# Patient Record
Sex: Female | Born: 1935 | Race: White | Hispanic: No | Smoking: Never smoker
Health system: Southern US, Community
[De-identification: ages and names within clinical notes are randomized; demographics above are authoritative.]

## PROBLEM LIST (undated history)

## (undated) DIAGNOSIS — M1990 Unspecified osteoarthritis, unspecified site: Secondary | ICD-10-CM

## (undated) DIAGNOSIS — D509 Iron deficiency anemia, unspecified: Secondary | ICD-10-CM

## (undated) DIAGNOSIS — I10 Essential (primary) hypertension: Secondary | ICD-10-CM

## (undated) DIAGNOSIS — I2510 Atherosclerotic heart disease of native coronary artery without angina pectoris: Secondary | ICD-10-CM

## (undated) DIAGNOSIS — G8929 Other chronic pain: Secondary | ICD-10-CM

## (undated) DIAGNOSIS — R011 Cardiac murmur, unspecified: Secondary | ICD-10-CM

## (undated) DIAGNOSIS — M549 Dorsalgia, unspecified: Secondary | ICD-10-CM

## (undated) DIAGNOSIS — E119 Type 2 diabetes mellitus without complications: Secondary | ICD-10-CM

## (undated) DIAGNOSIS — H269 Unspecified cataract: Secondary | ICD-10-CM

## (undated) DIAGNOSIS — K922 Gastrointestinal hemorrhage, unspecified: Secondary | ICD-10-CM

## (undated) DIAGNOSIS — I350 Nonrheumatic aortic (valve) stenosis: Secondary | ICD-10-CM

## (undated) DIAGNOSIS — E785 Hyperlipidemia, unspecified: Secondary | ICD-10-CM

## (undated) HISTORY — PX: CARDIAC CATHETERIZATION: SHX172

## (undated) HISTORY — DX: Atherosclerotic heart disease of native coronary artery without angina pectoris: I25.10

## (undated) HISTORY — PX: ABDOMINAL HYSTERECTOMY: SHX81

## (undated) HISTORY — DX: Nonrheumatic aortic (valve) stenosis: I35.0

## (undated) HISTORY — DX: Unspecified cataract: H26.9

## (undated) HISTORY — DX: Gastrointestinal hemorrhage, unspecified: K92.2

## (undated) HISTORY — DX: Type 2 diabetes mellitus without complications: E11.9

## (undated) HISTORY — PX: BACK SURGERY: SHX140

## (undated) HISTORY — DX: Hyperlipidemia, unspecified: E78.5

## (undated) HISTORY — DX: Iron deficiency anemia, unspecified: D50.9

## (undated) HISTORY — PX: CATARACT EXTRACTION: SUR2

## (undated) HISTORY — DX: Unspecified osteoarthritis, unspecified site: M19.90

## (undated) HISTORY — PX: TONSILLECTOMY AND ADENOIDECTOMY: SUR1326

## (undated) HISTORY — PX: APPENDECTOMY: SHX54

## (undated) HISTORY — DX: Essential (primary) hypertension: I10

## (undated) HISTORY — DX: Cardiac murmur, unspecified: R01.1

## (undated) HISTORY — PX: THORACOLUMBAR SYRINGO SHUNT: SHX2505

---

## 2000-05-14 ENCOUNTER — Encounter: Payer: Self-pay | Admitting: Cardiology

## 2000-05-14 ENCOUNTER — Encounter: Admission: RE | Admit: 2000-05-14 | Discharge: 2000-05-14 | Payer: Self-pay | Admitting: Cardiology

## 2000-05-22 ENCOUNTER — Ambulatory Visit (HOSPITAL_COMMUNITY): Admission: RE | Admit: 2000-05-22 | Discharge: 2000-05-22 | Payer: Self-pay | Admitting: Cardiology

## 2004-06-11 ENCOUNTER — Ambulatory Visit (HOSPITAL_COMMUNITY): Admission: RE | Admit: 2004-06-11 | Discharge: 2004-06-11 | Payer: Self-pay | Admitting: Neurosurgery

## 2004-07-19 ENCOUNTER — Inpatient Hospital Stay (HOSPITAL_COMMUNITY): Admission: RE | Admit: 2004-07-19 | Discharge: 2004-07-24 | Payer: Self-pay | Admitting: Neurosurgery

## 2005-02-08 ENCOUNTER — Ambulatory Visit: Payer: Self-pay | Admitting: Unknown Physician Specialty

## 2005-04-25 ENCOUNTER — Ambulatory Visit (HOSPITAL_COMMUNITY): Admission: RE | Admit: 2005-04-25 | Discharge: 2005-04-25 | Payer: Self-pay | Admitting: Orthopedic Surgery

## 2005-05-22 ENCOUNTER — Encounter: Admission: RE | Admit: 2005-05-22 | Discharge: 2005-05-22 | Payer: Self-pay | Admitting: Cardiology

## 2005-05-28 ENCOUNTER — Ambulatory Visit: Payer: Self-pay | Admitting: Family Medicine

## 2005-05-29 ENCOUNTER — Ambulatory Visit (HOSPITAL_COMMUNITY): Admission: RE | Admit: 2005-05-29 | Discharge: 2005-05-29 | Payer: Self-pay | Admitting: Cardiology

## 2005-06-06 ENCOUNTER — Ambulatory Visit (HOSPITAL_COMMUNITY): Admission: RE | Admit: 2005-06-06 | Discharge: 2005-06-06 | Payer: Self-pay | Admitting: Cardiology

## 2006-02-14 ENCOUNTER — Ambulatory Visit (HOSPITAL_COMMUNITY): Admission: RE | Admit: 2006-02-14 | Discharge: 2006-02-14 | Payer: Self-pay | Admitting: Neurosurgery

## 2006-03-13 ENCOUNTER — Inpatient Hospital Stay (HOSPITAL_COMMUNITY): Admission: RE | Admit: 2006-03-13 | Discharge: 2006-03-17 | Payer: Self-pay | Admitting: Neurosurgery

## 2006-12-02 ENCOUNTER — Ambulatory Visit: Payer: Self-pay | Admitting: Gastroenterology

## 2006-12-02 LAB — HM COLONOSCOPY: HM Colonoscopy: NORMAL

## 2007-02-03 ENCOUNTER — Ambulatory Visit: Payer: Self-pay | Admitting: Gastroenterology

## 2007-04-11 ENCOUNTER — Other Ambulatory Visit: Payer: Self-pay

## 2007-04-11 ENCOUNTER — Emergency Department: Payer: Self-pay | Admitting: Unknown Physician Specialty

## 2007-10-04 ENCOUNTER — Encounter: Admission: RE | Admit: 2007-10-04 | Discharge: 2007-10-04 | Payer: Self-pay | Admitting: Neurosurgery

## 2009-03-07 ENCOUNTER — Ambulatory Visit: Payer: Self-pay | Admitting: Family Medicine

## 2009-03-08 ENCOUNTER — Encounter: Admission: RE | Admit: 2009-03-08 | Discharge: 2009-03-08 | Payer: Self-pay | Admitting: Neurosurgery

## 2009-05-18 ENCOUNTER — Inpatient Hospital Stay (HOSPITAL_COMMUNITY): Admission: RE | Admit: 2009-05-18 | Discharge: 2009-05-26 | Payer: Self-pay | Admitting: Neurosurgery

## 2009-05-24 ENCOUNTER — Ambulatory Visit: Payer: Self-pay | Admitting: Physical Medicine & Rehabilitation

## 2010-03-28 ENCOUNTER — Emergency Department: Payer: Self-pay | Admitting: Emergency Medicine

## 2010-12-06 ENCOUNTER — Ambulatory Visit: Payer: Self-pay | Admitting: Family Medicine

## 2010-12-06 LAB — HM MAMMOGRAPHY

## 2011-02-24 LAB — CBC
HCT: 22.5 % — ABNORMAL LOW (ref 36.0–46.0)
HCT: 28.7 % — ABNORMAL LOW (ref 36.0–46.0)
Hemoglobin: 7.6 g/dL — CL (ref 12.0–15.0)
Hemoglobin: 9.6 g/dL — ABNORMAL LOW (ref 12.0–15.0)
MCHC: 33.9 g/dL (ref 30.0–36.0)
MCV: 93.7 fL (ref 78.0–100.0)
Platelets: UNDETERMINED 10*3/uL (ref 150–400)
RBC: 2.4 MIL/uL — ABNORMAL LOW (ref 3.87–5.11)
RBC: 3 MIL/uL — ABNORMAL LOW (ref 3.87–5.11)
RDW: 14 % (ref 11.5–15.5)

## 2011-02-24 LAB — URINE MICROSCOPIC-ADD ON

## 2011-02-24 LAB — GLUCOSE, CAPILLARY
Glucose-Capillary: 103 mg/dL — ABNORMAL HIGH (ref 70–99)
Glucose-Capillary: 109 mg/dL — ABNORMAL HIGH (ref 70–99)
Glucose-Capillary: 118 mg/dL — ABNORMAL HIGH (ref 70–99)
Glucose-Capillary: 132 mg/dL — ABNORMAL HIGH (ref 70–99)
Glucose-Capillary: 144 mg/dL — ABNORMAL HIGH (ref 70–99)
Glucose-Capillary: 150 mg/dL — ABNORMAL HIGH (ref 70–99)
Glucose-Capillary: 154 mg/dL — ABNORMAL HIGH (ref 70–99)
Glucose-Capillary: 160 mg/dL — ABNORMAL HIGH (ref 70–99)
Glucose-Capillary: 163 mg/dL — ABNORMAL HIGH (ref 70–99)
Glucose-Capillary: 182 mg/dL — ABNORMAL HIGH (ref 70–99)
Glucose-Capillary: 47 mg/dL — ABNORMAL LOW (ref 70–99)
Glucose-Capillary: 58 mg/dL — ABNORMAL LOW (ref 70–99)
Glucose-Capillary: 59 mg/dL — ABNORMAL LOW (ref 70–99)
Glucose-Capillary: 61 mg/dL — ABNORMAL LOW (ref 70–99)
Glucose-Capillary: 62 mg/dL — ABNORMAL LOW (ref 70–99)
Glucose-Capillary: 63 mg/dL — ABNORMAL LOW (ref 70–99)
Glucose-Capillary: 69 mg/dL — ABNORMAL LOW (ref 70–99)
Glucose-Capillary: 73 mg/dL (ref 70–99)
Glucose-Capillary: 73 mg/dL (ref 70–99)
Glucose-Capillary: 84 mg/dL (ref 70–99)
Glucose-Capillary: 92 mg/dL (ref 70–99)
Glucose-Capillary: 92 mg/dL (ref 70–99)
Glucose-Capillary: 97 mg/dL (ref 70–99)

## 2011-02-24 LAB — URINALYSIS, ROUTINE W REFLEX MICROSCOPIC
Bilirubin Urine: NEGATIVE
Glucose, UA: NEGATIVE mg/dL
Protein, ur: NEGATIVE mg/dL

## 2011-02-24 LAB — BASIC METABOLIC PANEL
BUN: 22 mg/dL (ref 6–23)
CO2: 23 mEq/L (ref 19–32)
Calcium: 7.8 mg/dL — ABNORMAL LOW (ref 8.4–10.5)
Calcium: 7.8 mg/dL — ABNORMAL LOW (ref 8.4–10.5)
Calcium: 8.1 mg/dL — ABNORMAL LOW (ref 8.4–10.5)
Chloride: 101 mEq/L (ref 96–112)
Chloride: 105 mEq/L (ref 96–112)
Creatinine, Ser: 1.25 mg/dL — ABNORMAL HIGH (ref 0.4–1.2)
Creatinine, Ser: 1.31 mg/dL — ABNORMAL HIGH (ref 0.4–1.2)
Creatinine, Ser: 1.54 mg/dL — ABNORMAL HIGH (ref 0.4–1.2)
GFR calc Af Amer: 47 mL/min — ABNORMAL LOW (ref >60)
GFR calc Af Amer: 50 mL/min — ABNORMAL LOW (ref >60)
GFR calc non Af Amer: 41 mL/min — ABNORMAL LOW (ref >60)
Glucose, Bld: 165 mg/dL — ABNORMAL HIGH (ref 70–99)
Glucose, Bld: 50 mg/dL — ABNORMAL LOW (ref 70–99)
Potassium: 5.1 mEq/L (ref 3.5–5.1)
Sodium: 136 mEq/L (ref 135–145)

## 2011-02-24 LAB — CROSSMATCH: Antibody Screen: NEGATIVE

## 2011-02-24 LAB — URINE CULTURE

## 2011-02-25 LAB — BASIC METABOLIC PANEL
BUN: 21 mg/dL (ref 6–23)
CO2: 23 mEq/L (ref 19–32)
Calcium: 9.2 mg/dL (ref 8.4–10.5)
Creatinine, Ser: 1.42 mg/dL — ABNORMAL HIGH (ref 0.4–1.2)
Glucose, Bld: 133 mg/dL — ABNORMAL HIGH (ref 70–99)

## 2011-02-25 LAB — TYPE AND SCREEN
ABO/RH(D): O POS
Antibody Screen: NEGATIVE

## 2011-02-25 LAB — CBC
MCHC: 33.5 g/dL (ref 30.0–36.0)
RDW: 13.6 % (ref 11.5–15.5)

## 2011-04-02 NOTE — Op Note (Signed)
Jasmine Webster, SANT NO.:  [id number]   MEDICAL RECORD NO.:  [id number]          PATIENT TYPE:  INP   LOCATION:  3112                         FACILITY:  [facility]   PHYSICIAN:  Ophelia Charter, M.D.DATE OF BIRTH:  1926-10-18   DATE OF PROCEDURE:  05/18/2009  DATE OF DISCHARGE:                               OPERATIVE REPORT   BRIEF HISTORY:  The patient is an 77 year old white female who I  previously performed L2-3, L3-4, and L4-5 lumbar fusion on.  The patient  initially did well, but more recently has developed increasing back,  hip, and leg pain consistent with neurogenic claudication.  She was  worked up with a lumbar MRI, which demonstrated the patient had a  spondylolisthesis at L1-2 and L5-S1 with severe stenosis.  I discussed  the various treatment options with the patient including the surgery.  The patient has weighed the risks, benefits, and alternatives of the  surgery and desired to proceed with the exploration and lumbar fusion  with extension, and decompression fusion from T10 through S1.   PREOPERATIVE DIAGNOSES:  L1-2 and L5-S1 spinal stenosis, disk  degeneration, spondylolisthesis, lumbago, lumbar radiculopathy.   POSTOPERATIVE DIAGNOSIS:  L1-2 and L5-S1 spinal stenosis, disk  degeneration, spondylolisthesis, lumbago, lumbar radiculopathy.   PROCEDURE:  Exploration of lumbar fusion; decompressive laminectomy at  L1 as well as L5 to decompress the thecal sac and bilateral L1, L2, L5,  and S1 nerve roots (this work was in addition to the work required due  to posterior lumbar interbody fusion).  Posterior lumbar interbody  fusion L5-S1 with local morselized autograft bone and Actifuse, bone  graft extenders with bone morphogenic protein; insertion of L5-S1  interbody prosthesis (Capstone PEEK interbody prosthesis); posterior  segmental fixation T10 through S1 with Legacy titanium pedicle screws  and rods; posterolateral arthrodesis at T10-11,  T11-12, T12-L1, L1-2, L5-  S1 with bone morphogenic protein, local autograft bone, and  Actifuse/Vitoss bone graft extenders.   SURGEON:  Ophelia Charter, MD   ASSISTANT:  Otilio Connors, MD   ANESTHESIA:  General endotracheal.   ESTIMATED BLOOD LOSS:  350 mL.   SPECIMENS:  None.   DRAINS:  None.   COMPLICATIONS:  None.   DESCRIPTION OF THE PROCEDURE:  The patient was brought to the operating  room by the Anesthesia team.  General endotracheal anesthesia was  induced.  The patient was turned to the prone position on the Wilson  frame.  Her lumbosacral region was then prepared with Betadine scrub and  Betadine solution.  Sterile drapes were applied.  I then injected the  area to be incised with Marcaine with epinephrine solution.  I used a  scalpel to make a linear midline incision from approximately T10 down  the S1.  I used an electrocautery performing bilateral subperiosteal  dissection exposing the spinous process and lamina from approximately T9  down to the upper sacrum.  We then inserted the cerebellar and Versa-  Trac retractor for exposure.  We used electrocautery to expose the old  hardware.  We proceeded to explore down to lumbar fusion by inspecting  the facet and see if there is any loosening of the hardware.  There was  none and it appeared that she had a good fusion from L2 through L5.   We now turned our attention to the decompression at L5-S1 and L1-2.  We  used a high-speed drill to perform bilateral L5 and L1 laminotomies.  We  used a Kerrison punch to remove the ligament flavum at L1-2 and L5-S1  bilaterally.  We exposed the thecal sac.  There was quite a bit stenosis  at both levels.  We performed wide foraminotomies about the bilateral L1-  L2 and L5-S1 nerve roots decompressing the thecal sac and the nerve  roots.  Of note, the work required to do this was in addition to work  for her to do lumbar fusion because of the patient's severe stenosis,   facet arthropathy, foraminal stenosis, etc.   Having completed the decompression, we now turned our attention to the  posterior lumbar interbody fusion.  I incised the L5-S1 intervertebral  disk with a 15-blade scalpel bilaterally and performed a partial  intervertebral diskectomy.  We then prepared the vertebral endplates for  the interbody fusion by using curettes.  We then used trial spacers.  We  determined to use a 10 x 22 mm PEEK interbody prosthesis at L5-S1.  We  prefilled the prosthesis with a combination of local morselized  autograft bone, bone morphogenic protein, and Actifuse bone graft  extenders.  We inserted the prosthesis into the L5-S1 interspace  bilaterally of course after retracting the neural structures out of  harm's way.  We then filled the remainder of disk space at L5-S4 with  Actifuse and local morselized autograft bone.  This completed the  posterior lumbar interbody fusion at L5-S1.  We contemplated doing a  posterior lumbar interbody fusion at L1-2, but I did not think we had  room for fear of injuring the conus medullaris.  We therefore decided to  not do posterior lumbar interbody fusion at that level instead do  posterolateral arthrodesis only.   Under fluoroscopic guidance, we cannulated the bilateral T10, T11, T12,  L1, and S1 pedicles with the bone probes.  We tapped the S1 pedicle with  a 6.5 mm tap.  The remaining of the pedicles we tapped with a 5.5-mm  tap.  We removed the tap and then probed inside of pedicles at each  level with a ball probe and rule out cortical breeches.  We then  inserted a 7.5 x 45-mm pedicle screws bilaterally at S1 and 6.5-mm  pedicle screws at the remainder of the pedicles under fluoroscopic  guidance and got a bony purchase.  We decided since we got good bony  purchase, we did not need to go above T10 with the instrumentation.  We  then connected the unilateral pedicle screws with a lordotic rod.  In  order to simplify  the placement of the rod, we decided that we would cut  the rod just above the L5 pedicle screw and put a shorter rod at L5-S1  bilaterally.  We compressed the construct at L5-S1 and then secured the  rod in place with caps, which we tightened appropriately.  We then  removed the longer rod and connected from pedicle screws, and then cut a  longer rod, contorted it to proper configuration, and then connected  unilateral pedicle screws from T10 through L4 bilaterally with the  lordotic rod.  We secured the rod in place with caps, which we  tightened  appropriately.  We placed a cross connector thus completing the  instrumentation.   We now turned our attention to posterolateral arthrodesis.  We used a  high-speed drill to decorticate the remainder of posterolateral  structures at T10-11, T11-12, T12-L1, L1-2, L5-S1 bilaterally.  We then  laid combination of bone morphogenic protein soaked collagen sponges and  local morselized autograft bone.  Vitoss and Actifuse bone graft  extenders over these posterolateral structures thus completing the  posterolateral arthrodesis.   We then inspected the facet at L5-S1 and L1-2 and noted the structures  well decompressed.  We obtained hemostasis using bipolar electrocautery.  We removed the retractors and then reapproximated the patient's  thoracolumbar fascia with interrupted #1 Vicryl suture, subcutaneous  with interrupted 2-0 Vicryl suture, and the skin with Steri-Strips and  Benzoin.  The wound was then coated with bacitracin ointment.  Sterile  dressing was applied.  The drapes were  removed and the patient was subsequently returned to supine position  where she was extubated by the Anesthesia team and transported to the  postanesthesia care unit in stable condition.  All sponge, instrument,  and needle counts were correct at the end of this case.      Ophelia Charter, M.D.  Electronically Signed     JDJ/MEDQ  D:  05/18/2009  T:   05/19/2009  Job:  [id number]

## 2011-04-05 NOTE — Discharge Summary (Signed)
Jasmine Webster, BEAGLEY NO.:  [id number]   MEDICAL RECORD NO.:  [id number]          PATIENT TYPE:  INP   LOCATION:  3017                         FACILITY:  [facility]   PHYSICIAN:  Otilio Connors, M.D.  DATE OF BIRTH:  1926/06/21   DATE OF ADMISSION:  05/18/2009  DATE OF DISCHARGE:  05/26/2009                               DISCHARGE SUMMARY   DIAGNOSES:  L1-2 and L5-S1 spinal stenosis, disk degeneration,  spondylolisthesis, lumbar radiculopathy __________ lumbar spine.   PROCEDURE:  Exploration of __________ and at L5 and extension of fusion  and fusion with pedicle screw instrumentation from T10 to S1 using  Legacy titanium pedicle screws, autograft bone, Actifuse, Vitoss, and  BMP.   REASON FOR ADMISSION:  The patient is an 77 year old woman who had L2-5  fusion previously, did well, and had progressive stenosis __________  above and below. Prior fusion at L1-2 and L5-S1 levels.  The patient  brought in for decompression and fusion at these levels.   HOSPITAL COURSE:  The patient was admitted on the day of surgery and  underwent the procedure above without complications.  Postop, the  patient was then transferred to the recovery room, and she is doing  well.  She is moving extremities,__________ increase in her activity the  day following surgery.  PT/OT will work with the patient to increase  mobilization, and she continued to do well and was slow to get  mobilized.  Foley was left in 2 days, but the patient did have some  nausea and some mild ileus, but has good bowel sounds.  We continued to  work on increasing activity and x-ray was done of the abdomen showing  ileus.  We will continue to prevent this and she started to having  resolution of the ileus, able to ambulate, and mobilize little bit more.  Rehab Medicine was consulted for possible  __________.  The patient  really continued to improve.  Ambulate much more better.  She will be  transferred to the  floor on May 23, 2009.  Active bowel sounds.  Abdomen  was sound, and she continued to make progress.  Foley was able to be  removed on May 23, 2009.  She was voiding well.  Pulse was a bit up and  blood pressure just a little up.  CBC was done.  Due to low H and H, she  was transfused 1 unit of packed red cells, and she did well.  Symptoms  stabilized __________when she was up, that resolved __________she is up,  ambulating well, and she had bowel movement.  Ileus was resolved.  Incision remained clean, dry, and intact.  On May 26, 2009, the patient  was discharged home in stable condition.   DISCHARGE MEDICATIONS:  Same as prehospitalization plus Percocet and  Flexeril p.r.n. and __________ for 5 days.  __________ home health nurse  __________ at home.   FOLLOWUP:  Follow up with me and Dr. Arnoldo Morale in few weeks in the office.           ______________________________  Otilio Connors, M.D.  JRH/MEDQ  D:  06/15/2009  T:  06/15/2009  Job:  [id number]

## 2011-04-05 NOTE — Cardiovascular Report (Signed)
NAMETONNI, HACKENBURG NO.:  [id number]   MEDICAL RECORD NO.:  [id number]          PATIENT TYPE:  OIB   LOCATION:  2899                         FACILITY:  [facility]   PHYSICIAN:  Jeanella Craze. Little, M.D. DATE OF BIRTH:  May 10, 1926   DATE OF PROCEDURE:  05/29/2005  DATE OF DISCHARGE:                              CARDIAC CATHETERIZATION   This 77 year old female had back surgery one year ago.  Prior to her back  surgery, she was walking a mile to two miles per day with no real  limitations.  She can now only walk about two blocks without having chest  discomfort and rather severe shortness of breath and fatigue.  In addition  to this, she had a new left bundle branch block on her EKG in the office.  A  2-D echo was performed that showed normal LV systolic function and only mild  valvular heart disease but did show significant pulmonary artery pressure  elevation at 42 mmHg.   She is brought in for a left heart catheterization with the plans that if  the left heart does not give an explanation, would do a right heart  catheterization for pressures only.   After obtaining informed consent, the patient was prepped and draped in the  usual sterile fashion exposing the right .  Following local anesthetic with  1% Xylocaine, the Seldinger technique was employed and a 5-French introducer  sheath was placed into the right femoral artery.  Coronary arteriography and  ventriculography in the RAO and LAO projection was then performed.  Total  contrast was 100 mL.   With no significant abnormalities found with her left heart catheterization,  attempts in the right heart catheterization was performed.  Multiple sticks  in the right groin ultimately resulted in a 8-French catheter being placed  in her right femoral artery.  At this point, the left groin was prepped and  finally using a Smart needle, we were able to enter the left femoral vein.  A Swan-Ganz catheter was then advanced  through its normal route into the  pulmonary artery.  Hemodynamic monitoring was undertaken throughout  __________.   COMPLICATIONS:  None.   EQUIPMENT:  5-French diagnostic Judkins configuration catheters and a 8-  French Swan-Ganz catheter.   RESULTS:  1.  Hemodynamic monitoring:  Right atrial pressure 6, right ventricular      pressure 37/2.  Pulmonary artery pressure 34/11 and the wedge was 15.  2.  Central aortic pressure 188/84.  Left ventricular pressure was 181/7.      No significant gradient was noted at the time of pullback.  The patient      was given 20 mg of IV labetalol and her pressure decreased to 183/80.   RHYTHM:  She was in a sinus tachycardia with a left bundle when she came  into the room.  This persisted intermittently throughout the entire case but  she did have episodes of a narrow QRS, raising the concern that she has an  intermittent left bundle.  She did have in the in the narrow QRS rhythm a  rate  of 69.  She also had with a left bundle a rate as low at 64.  This does  not imply that this would be a rate-related bundle.   VENTRICULOGRAPHY:  Ventriculography in both the RAO and LAO projection was  performed.  There were no focal wall motion abnormalities.  There was normal  LV systolic function.  Ejection fraction in excess of 60%, no mitral  regurgitation was seen.  Left ventricular end-diastolic pressure was 0.   CORONARY ARTERIOGRAPHY.:  1.  Left main normal.  2.  Circumflex:  This is a dominant vessel giving rise to two large OMs, a      small OM and the posterior descending artery.  There was no significant      disease noted in any of this.  3.  LAD.  The LAD extended down to the apex of  the heart, had a first      diagonal vessel.  This system was free of disease.  4.  Proximal diagonal.  This bifurcated and was free of disease.  5.  Right coronary artery:  Small, nondominant.   I cannot explain her symptoms based on her cardiac anatomy, and  she does not  have significant pulmonary hypertension.  An echocardiogram had been  performed to evaluate for significant valvular abnormalities.   Because of the clear relationship to her back surgery in onset of these  symptoms, she may benefit from a CT scan to rule out pulmonary emboli  although they may not be able to be detected at this point.  I plan to check  oxygen saturations at rest and with ambulation to make sure she does not  desaturate with activity.  In addition to this, although she snores heavily,  she has no symptoms that suggest apnea.       ABL/MEDQ  D:  05/29/2005  T:  05/29/2005  Job:  [id number]   cc:   Miguel Aschoff  [address] [place] [address]  Fax: [phone]

## 2011-04-05 NOTE — Discharge Summary (Signed)
NAMELANEA, SPRUIELL NO.:  [id number]   MEDICAL RECORD NO.:  [id number]          PATIENT TYPE:  INP   LOCATION:  3021                         FACILITY:  [facility]   PHYSICIAN:  Ophelia Charter, M.D.DATE OF BIRTH:  02-Feb-1926   DATE OF ADMISSION:  07/19/2004  DATE OF DISCHARGE:  07/24/2004                                 DISCHARGE SUMMARY   BRIEF HISTORY:  The patient is a 77 year old white female who was suffering  from back and leg pain.  She has failed medical management and was worked up  with a lumbar MRI and x-rays which demonstrated a lumbar spinal stenosis and  spondylolisthesis, degenerative disk disease, etc, at L3-4 and L4-5.  I  discussed the various treatment options with her including surgery.  The  patient has weighed the risks, benefits and alternatives to surgery and  decided to proceed with an L3-4 and L4-5 decompression and fusion.   For further details of this admission, please refer to the typed history and  physical.   HOSPITAL COURSE:  I admitted the patient to [facility] on July 19, 2004 on the day before I performed an L3-4 and L4-5 fusion.  The surgery  went well without complications (for full details of this operation, please  refer to typed operative note).   POSTOPERATIVE COURSE:  The patient's postoperative course was essentially  unremarkable.  By postop day #5, the patient was afebrile, her vital signs  were stable, she was eating well, ambulating well and her wound was healing  well without signs of infection, and she was requesting discharge home.  She  was discharged to home on July 24, 2004.   DISCHARGE PRESCRIPTIONS:  The patient was given prescriptions for:  1.  Percocet 5 mg, #100, one to two p.o. q.4 h. p.r.n. for pain.  2.  Valium 5 mg, #50, one p.o. q.6 h. p.r.n. for muscle spasms.   She was instructed to resume her outpatient medical regimen.   DISCHARGE INSTRUCTIONS:  The patient was given written  discharge  instructions and instructed to follow up with me in 4 weeks.   FINAL DIAGNOSES:  L3-4 and L4-5 degenerative disk disease, spondylolisthesis  and stenosis.   PROCEDURE PERFORMED:  L3 laminectomy to treat the patient's spinal stenosis  at L3-4; L4 Gill procedure; L3-4 and L4-5 posterior lumbar interbody fusion;  insertion of the L3-4 and L4-5 interbody prosthesis (CAPSTONE PEEK cages);  posterior segmental instrumentation with Legacy titanium screws on right;  posterolateral arthrodesis with local morcelized autograft bone and VITOSS  bone scaffolding.     JDJ/MEDQ  D:  08/16/2004  T:  08/17/2004  Job:  [id number]

## 2011-04-05 NOTE — Op Note (Signed)
Jasmine Webster, Jasmine Webster NO.:  [id number]   MEDICAL RECORD NO.:  [id number]          PATIENT TYPE:  INP   LOCATION:  3002                         FACILITY:  [facility]   PHYSICIAN:  Ophelia Charter, M.D.DATE OF BIRTH:  03/08/1926   DATE OF PROCEDURE:  03/13/2006  DATE OF DISCHARGE:                                 OPERATIVE REPORT   BRIEF HISTORY:  The patient is a 77 year old white female whom I performed an  L3-4 and L4-5 fusion on approximately 2 years ago.  The patient did well for  some time but recently has developed severe back and leg pain consistent  with neurogenic claudication and a lumbar radiculopathy.  I worked up with a  lumbar MRI which demonstrated the patient had developed severe  multifactorial spinal stenosis at L2-3, and on top of that she had a  ruptured disc at L2-3.  I discussed treatment options with the patient  including surgery.  The patient has weighed the risks, benefits, and  alternatives to surgery and has decided to proceed with an L2-3  decompression and fusion.   PREOPERATIVE DIAGNOSIS:  L2-3 herniated nucleus pulposus, degenerative disc  disease, spinal stenosis, lumbar radiculopathy, lumbago.   POSTOPERATIVE DIAGNOSIS:  L2-3 herniated nucleus pulposus, degenerative disc  disease, spinal stenosis, lumbar radiculopathy, lumbago.   PROCEDURE:  Redo L2 laminectomy; L2-3 posterior lumbar interbody fusion;  insertion of bilateral L2-3 interbody prostheses (capstone PEAK cages); L2-3  posterolateral arthrodesis with local morcellized autograft bone, and VTAS  bone graft extender; posterior segmental fixation from L2-L5 with Legacy  titanium pedicle screws and rods.   SURGEON:  Ophelia Charter, M.D.   ASSISTANT:  Marchia Meiers. Vertell Limber, M.D.   ANESTHESIA:  General endotracheal.   ESTIMATED BLOOD LOSS:  250 cc.   SPECIMENS:  None.   DRAINS:  None.   COMPLICATIONS:  None.   DESCRIPTION OF PROCEDURE:  The patient was brought to the  operating room by  the anesthesia team.  General endotracheal anesthesia was induced.  The  patient was then turned to the prone position on the Wilson frame.  Her  lumbosacral region was then prepped with Betadine scrub and Betadine  solution.  Sterile drapes were applied.  I injected the area to be incised  with Marcaine with epinephrine solution, and used a scalpel to incise  through the patient's previous surgical scar.  I used electrocautery to  perform a bilateral subperiosteal dissection, exposing the spinous processes  and lamina of L2 and also exposing the previous hardware from L3-L5.  I  inserted the cerebellar retractors for exposure.  I began by incising the  interspinous ligaments at L1-2 with the scalpel.  I then used a Leksell  rongeur to remove the spinous process of L2 and part of the L2 lamina.  We  saved this bone and later cleared it of soft tissue and morcellized it and  used it in the fusion process as autograft bone.  I used a high-speed drill  to perform a bilateral L2 laminotomies.  Of note, I encountered quite a bit  of epidural fibrosis from the  prior surgery.  I used the Kerrison punch to  complete the L2 laminectomy, and I decompressed the thecal sac well.  I  performed foraminotomies about the bilateral L2 and L3 nerve roots.  I then  freed up the left thecal sac and left L3 nerve root from the epidural  fibrosis, and then I encountered the disc herniation, and I incised into it  and removed it in multiple fragments using pituitary forceps, completing the  decompression.   We now turned our attention to the posterior lumbar interbody fusion.  I  incised the L2-3 intervertebral disc bilateral with a 15 blade scalpel and  performed aggressive diskectomies using the pituitary forceps and the  curettes.  We prepared the vertebral endplates by clearing all the soft  tissue.  We then prefilled 12 x 26 mm Capstone PEAK cages with local  autograft bone and VTAS bone  graft extender.  We then inserted them into the  distracted L2-3 interspace.  We filled in between and lateral to the cages  with VTAS and autograft bone, completing the posterior lumbar interbody  fusion.  Of note, of course we retracted the neural structures out of harm's  way when placing the cages.   We now turned our attention to the posterior instrumentation.  Under  fluoroscopic guidance, I cannulated the bilateral L2 pedicles with the bone  probes and then tapped the pedicles with a 4.5 mm tap, and inserted 5.5 x 50  mm pedicle screws bilaterally into the L2 pedicle under fluoroscopic  guidance.  I then palpated along the medial aspect of the bilateral L2  pedicles and noted that there were no cortical breaches, and the L2 nerve  root was not injured.  I then removed the cross-connector and the rods from  the old construct.  I then obtained a longer rod and connected the  unilateral pedicle screws from L2 down to L5 bilaterally.  I then secured  the rod in place with the caps, which were tightened appropriately, and then  placed the cross-connector, ending the instrumentation from L2-L5.   We now turned our attention to the posterolateral arthrodesis.  I used a  high-speed drill to decorticate the remainder of the L2 pars and the L2  facets.  I then laid a combination of local morcellized autograft bone and  VTAS over these decorticated posterolateral structures, completing the  posterolateral arthrodesis.   We then obtained hemostasis using bipolar electrocautery.  I irrigated the  wound out with bacitracin solution.  I inspected the thecal sac and the L2  and L3 nerve roots and noted that they were well decompressed.  We then  removed the retractors and then reapproximated the patient's thoracolumbar  fascia with interrupted #1 Vicryl suture, the subcutaneous tissue with  interrupted 2-0 Vicryl suture, and the skin with Steri-Strips and Benzoin. The wound was then coated with  bacitracin ointment and sterile dressing  applied.  The drapes were removed, and the patient was subsequently returned  to the supine position, where she was extubated by the anesthesia team and  transported to the postanesthesia care unit in stable condition.  All  sponge, instrument, and needle counts were correct at the end of this case.      Ophelia Charter, M.D.  Electronically Signed     JDJ/MEDQ  D:  03/13/2006  T:  03/14/2006  Job:  [id number]

## 2011-04-05 NOTE — Discharge Summary (Signed)
NAMEABIGAILE, Jasmine Webster NO.:  [id number]   MEDICAL RECORD NO.:  [id number]          PATIENT TYPE:  INP   LOCATION:  3002                         FACILITY:  [facility]   PHYSICIAN:  Ophelia Charter, M.D.DATE OF BIRTH:  1926/02/15   DATE OF ADMISSION:  03/13/2006  DATE OF DISCHARGE:  03/17/2006                                 DISCHARGE SUMMARY   BRIEF HISTORY:  The patient is a 77 year old white female who I performed an  L3-4 and 4-5 fusion on approximately 2 years ago and the patient did well  for some time but recently has developed severe back and leg pain consistent  with neurogenic claudication and lumbar radiculopathy.  I worked the patient  up with a lumbar MRI which demonstrated the patient had developed severe  multifactorial spinal stenosis at L2-3.  On top of that she had a ruptured  disc at L2-3.  I discussed the various treatment options with the patient  including surgery.  The patient has weighed the risks, benefits, and  alternatives of surgery and has decided to proceed with an L2-3  decompression and fusion.   For further details of this admission please refer to the typed history and  physical.   HOSPITAL COURSE:  I admitted the patient to [facility] on March 13, 2006.  On the date of admission I performed a L2-3 decompression and  instrumentation and fusion.  The surgery went well without complications  (for full details of this operation please refer to typed operative note).   POSTOPERATIVE COURSE:  The patient's postoperative course was unremarkable  and she was discharged to home on March 17, 2006.   DISCHARGE INSTRUCTIONS:  The patient was given written discharge  instructions, instructed to follow up with me in 4 weeks.   DISCHARGE PRESCRIPTIONS:  1.  Percocet 10/325 #100 one p.o. q.4h. p.r.n. for pain.  2.  Valium 2 mg #50 one p.o. q.6h. p.r.n. for muscle spasms.   FINAL DIAGNOSES:  1.  L2-3 herniated nucleus pulposus,  degenerative disease, spinal stenosis,      lumbar radiculopathy, lumbago.  2.  Bronchitis.   PROCEDURE PERFORMED:  Re-do L2 laminectomy; L2-3 posterior lumbar interbody  fusion; insertion of bilateral L2-3 interbody prosthesis (Capstone PEEK  cages); L2-3 posterolateral arthrodesis with local morcellized autograft  bone and Vitoss bone graft extender; posterior segmental fixation L2 to L5  with Legacy titanium pedicle screws and rods.     Ophelia Charter, M.D.  Electronically Signed    JDJ/MEDQ  D:  04/10/2006  T:  04/10/2006  Job:  [id number]

## 2011-04-05 NOTE — Cardiovascular Report (Signed)
[place]  Patient:    Jasmine Webster, Jasmine Webster                      MRN: [id number] Proc. Date: 05/22/00 Adm. Date:  [id number] Attending:  Lawana Pai CC:         Miguel Aschoff, M.D., [place], [place]                        Cardiac Catheterization  INDICATIONS FOR TEST:  [name] is a 77 year old female who has a profoundly strong positive history of CAD.  She is referred to our office because of recurrent episodes of chest pain occasionally at rest, and exertional in nature; with a strong family history of heart disease.  PROCEDURES: 1. Left heart catheterization. 2. Selective right and left coronary arteriography. 3. Ventriculography in the RAO projection.  DESCRIPTION OF PROCEDURE:  The patient was prepped and draped in the usual sterile fashion, exposing the right groin and applying local anesthetic with 1% Xylocaine.  The Seldinger technique was employed and the 6-French introducer sheath was placed into the right femoral artery.  Selective right and left coronary arteriography and ventriculography in the RAO projection was performed.  Then 6-French Judkins configuration catheters were used.  No complications were noted.  RESULTS:  HEMODYNAMIC MONITORING: 1. Central aortic pressure:  163/83. 2. Left ventricular pressure:  163/19. There was no aortic valve gradient noted at the time of pullback.  VENTRICULOGRAPHY:  Done in the RAO projection, using 25 cc of contrast at 12 cc/sec.  This revealed normal left ventricular systolic function, with an ejection fraction of greater than 60%.  The end-diastolic pressure was elevated at 19.  No mitral regurgitation was seen.  Mild left ventricular hypertrophy was noted.  CORONARY ARTERIOGRAPHY:  There was no calcification noted on fluoroscopy. 1. LEFT MAIN:  Short and normal. 2. LEFT ANTERIOR DESCENDING ARTERY:  Extended down to the apex of the heart    and gave rise to a diagonal.  This  appeared to be about the same diameter    as the LAD, and almost looked like a twin LAD system after the mid portion.    This entire system was free of disease. 3. CIRCUMFLEX:  A very large, dominant vessel at 3.5 mm. It gave rise to two    large OM vessels and a PDA.  This entire system was free of disease. 4. RIGHT CORONARY ARTERY:  A small, nondominant vessel just supplying the    proximal portion of the RV.  CONCLUSION: 1. Mild left ventricular hypertrophy. 2. Normal left ventricular systolic function. 3. No evidence of coronary artery disease.  DISCUSSION:  At this point there is not a cardiac etiology to explain her chest pain.   She will need treatment for her hypertension and LVH with antihypertensive drugs.  Will allow her to go home later today and follow up in my office tomorrow for leg check and the institution of medical therapy. DD:  05/22/00 TD:  05/22/00 Job: [id number]

## 2011-04-05 NOTE — Op Note (Signed)
NAME:  Jasmine Webster, Jasmine Webster                         ACCOUNT NO.:  [id number]   MEDICAL RECORD NO.:  [id number]                   PATIENT TYPE:  INP   LOCATION:  3021                                 FACILITY:  [facility]   PHYSICIAN:  Ophelia Charter, M.D.            DATE OF BIRTH:  June 21, 1926   DATE OF PROCEDURE:  DATE OF DISCHARGE:                                 OPERATIVE REPORT   BRIEF HISTORY:  The patient is a 77 year old white female who has suffered  from back and leg pain.  She failed medical management and was worked up  with lumbar MRI and x-rays which demonstrated lumbar spinal stenosis and  spondylolisthesis, degenerative disease, etc. at L3-4 and L4-5.  I discussed  the various treatment options with her including surgery.  The patient has  weighed the risks, benefits, and alternatives to surgery and decided to  proceed with an L3-4, 4-5 decompression and fusion.   PREOPERATIVE DIAGNOSIS:  L3 laminectomy to treat the patient's spinal  stenosis L3-4; L4 Gill procedure; L3-4 and L4-5 posterior lumbar interbody  fusion; insertion of L3-4 and L4-5 interbody prosthesis (Capstone  __________), posterior segment instrumentation of both Legacy Titanium  pedicle screws and rods; posterolateral arthrodesis of local morcellated  autograft bone and V-toss bone scaffolding.   SURGEON:  Ophelia Charter, M.D.   ASSISTANT:  Marchia Meiers. Vertell Limber, M.D.   ANESTHESIA:  General endotracheal anesthesia.   ESTIMATED BLOOD LOSS:  400 mL.   SPECIMENS:  None.   DRAINS:  None.   COMPLICATIONS:  None.   DESCRIPTION OF PROCEDURE:  The patient was brought to the operating room by  the anesthesia team.  General endotracheal anesthesia was induced.  The  patient was turned to the prone position on the Wilson frame.  The  lumbosacral region then prepped out Betadine scrub and Betadine solution.  Sterile drapes were applied and then injected perioperative sites with  Marcaine with epinephrine  solution.  Using a scalpel I made a linear midline  incision over the L3-4 and L4-5 interspaces.  I used electrocautery to  perform a bilateral subperiosteal dissection exposing the lamina and facets  at L3-4 and L4-5.  I inserted the Versatrac retractor and we obtained  intraoperative radiograph to confirm our location.   We began the decompression by incising the interspinous ligament at L2-3, 3-  4 and 4-5.  We then used the Leksell rongeur to remove the lamina of L3 and  L4.  We then used a high speed drill for bilateral L3 and L4 laminotomies.  We completed the L4 Gill procedure with a Kerrison punch, removing the L4  lamina and providing additional decompression in the lateral recess which is  quite stenotic because of the spondylolisthesis and we completed the L3  laminectomy in a similar fashion with a Kerrison punch decompressing the  thecal sac _________ about the bilateral L3, 4 and 5 nerve roots.  There was  quite a bit of spinal stenosis particularly at L4-5.  We gradually then  freed up the thecal sac of all tissue and carefully retracted the thecal sac  and nerve root out of harm's way and we incised the L3-4 and L4-5 __________  disc.  We did this bilaterally, performed aggressive dissection using a  pituitary forceps and Epstein and Scoville curettes.  We now prepared the  vertebral end plates for the posterior lumbar interbody fusion by  distracting the contralateral disc space and then preparing the ipsilateral  disc space using the curettes.  We inserted 10 x 22 mm Capstone ___________  changes bilaterally at L5-S1 and 10 x 26 mm Capstone __________ changes  bilaterally  at L4-5.  There was good flow throughout the __________  prosthesis at both levels.  We had prefilled the prosthesis support with  local morcellated autograft bone and V-toss bone scaffolding and also filled  in between interbody creases with this completing the posterior lumbar  interbody fusion.  We  now turned our attention to the posterior segmental  instrumentation.  We used electrocautery to expose lateral transverse  process of L3, L4 and L5. Then under fluoroscopic guidance, we cannulated  the bilateral L3, 4 and 5 pedicles with bone plugs, tapped the  pedicles__________ tapped pedicles __________ and then inserted 6.5 x 45 mm  pedicle screws bilaterally in the pedicles at L3, 4 and 5.  We then palpated  along the medial aspect of the pedicles, noted that there were no critical  breeches and that the L3, 4, and 5 vertebrae were not injured.  We checked  unilateral pedicle screws with a lordotic rod.  We could press the construct  and then secure the rod in place with the appropriate caps.  We then  connected the rods with a cross connector, tied this appropriately and this  completed the instrumentation.   We now turned our attention to the posterior lateral arthrodesis.  We used a  high speed drill to decorticate the remainder of the L3-4 and L4-5 facet,  pars transverse process, etc., and then __________ localized morselized  autograft and V-toss bone scaffolding of the decorticated posterolateral  structures completing the posterolateral arthrodesis.  We then obtained  stringent hemostasis using bipolar electrocautery.  We then inspected the  thecal sac of bilateral L3, 4 and 5 nerve roots and noted that they were  well decompressed.  We then removed the Versatrac retractor and  reapproximated the patient's thoracolumbar fascia with interrupted #1 Vicryl  suture, subcutaneous tissue with interrupted 2-0 Vicryl suture and the skin  with Steri-Strips and Benzoin.  The wound was covered with Bacitracin  ointment.  A sterile dressing was applied.  The tourniquet removed.  The  patient was subsequently returned to the supine position where she was  extubated by the anesthesia team and transported to post anesthesia care unit in stable condition.  All sponge, needle and instrument  counts were  correct at the end of this case.                                               Ophelia Charter, M.D.    JDJ/MEDQ  D:  07/19/2004  T:  07/20/2004  Job:  [id number]

## 2011-09-19 DEATH — deceased

## 2013-12-27 LAB — CBC AND DIFFERENTIAL
HEMATOCRIT: 39 % (ref 36–46)
HEMOGLOBIN: 12.3 g/dL (ref 12.0–16.0)
Neutrophils Absolute: 46 /uL
WBC: 5.6 10^3/mL

## 2013-12-27 LAB — TSH: TSH: 2.81 u[IU]/mL (ref <=5.90)

## 2014-03-17 ENCOUNTER — Ambulatory Visit: Payer: Self-pay | Admitting: Family Medicine

## 2014-07-24 ENCOUNTER — Emergency Department: Payer: Self-pay | Admitting: Emergency Medicine

## 2014-11-21 DIAGNOSIS — L03012 Cellulitis of left finger: Secondary | ICD-10-CM | POA: Diagnosis not present

## 2014-11-28 DIAGNOSIS — E119 Type 2 diabetes mellitus without complications: Secondary | ICD-10-CM | POA: Diagnosis not present

## 2014-11-28 DIAGNOSIS — R530 Neoplastic (malignant) related fatigue: Secondary | ICD-10-CM | POA: Diagnosis not present

## 2014-11-28 DIAGNOSIS — I2510 Atherosclerotic heart disease of native coronary artery without angina pectoris: Secondary | ICD-10-CM | POA: Diagnosis not present

## 2014-12-06 DIAGNOSIS — E785 Hyperlipidemia, unspecified: Secondary | ICD-10-CM | POA: Diagnosis not present

## 2014-12-06 DIAGNOSIS — E119 Type 2 diabetes mellitus without complications: Secondary | ICD-10-CM | POA: Diagnosis not present

## 2014-12-27 DIAGNOSIS — Z23 Encounter for immunization: Secondary | ICD-10-CM | POA: Diagnosis not present

## 2014-12-27 DIAGNOSIS — E119 Type 2 diabetes mellitus without complications: Secondary | ICD-10-CM | POA: Diagnosis not present

## 2014-12-27 DIAGNOSIS — I10 Essential (primary) hypertension: Secondary | ICD-10-CM | POA: Diagnosis not present

## 2015-01-24 DIAGNOSIS — I10 Essential (primary) hypertension: Secondary | ICD-10-CM | POA: Diagnosis not present

## 2015-01-24 DIAGNOSIS — E119 Type 2 diabetes mellitus without complications: Secondary | ICD-10-CM | POA: Diagnosis not present

## 2015-01-31 DIAGNOSIS — E780 Pure hypercholesterolemia: Secondary | ICD-10-CM | POA: Diagnosis not present

## 2015-01-31 DIAGNOSIS — E119 Type 2 diabetes mellitus without complications: Secondary | ICD-10-CM | POA: Diagnosis not present

## 2015-01-31 DIAGNOSIS — I10 Essential (primary) hypertension: Secondary | ICD-10-CM | POA: Diagnosis not present

## 2015-01-31 DIAGNOSIS — I2510 Atherosclerotic heart disease of native coronary artery without angina pectoris: Secondary | ICD-10-CM | POA: Diagnosis not present

## 2015-01-31 DIAGNOSIS — K219 Gastro-esophageal reflux disease without esophagitis: Secondary | ICD-10-CM | POA: Diagnosis not present

## 2015-02-03 DIAGNOSIS — E780 Pure hypercholesterolemia: Secondary | ICD-10-CM | POA: Diagnosis not present

## 2015-02-03 LAB — HEPATIC FUNCTION PANEL
ALT: 15 U/L (ref 7–35)
AST: 19 U/L (ref 13–35)
Alkaline Phosphatase: 114 U/L (ref 25–125)
BILIRUBIN DIRECT: 0.12 mg/dL (ref 0.01–0.4)
BILIRUBIN, TOTAL: 0.4 mg/dL

## 2015-02-03 LAB — LIPID PANEL
Cholesterol: 240 mg/dL — AB (ref 0–200)
HDL: 53 mg/dL (ref 35–70)
LDL Cholesterol: 126 mg/dL
LDL/HDL RATIO: 2.4
Triglycerides: 306 mg/dL — AB (ref 40–160)

## 2015-02-03 LAB — HEMOGLOBIN A1C: HEMOGLOBIN A1C: 7.9 % — AB (ref 4.0–6.0)

## 2015-03-23 DIAGNOSIS — R5383 Other fatigue: Secondary | ICD-10-CM

## 2015-03-23 DIAGNOSIS — I2510 Atherosclerotic heart disease of native coronary artery without angina pectoris: Secondary | ICD-10-CM | POA: Insufficient documentation

## 2015-03-23 DIAGNOSIS — K219 Gastro-esophageal reflux disease without esophagitis: Secondary | ICD-10-CM | POA: Insufficient documentation

## 2015-03-23 DIAGNOSIS — E7800 Pure hypercholesterolemia, unspecified: Secondary | ICD-10-CM | POA: Insufficient documentation

## 2015-03-23 DIAGNOSIS — E039 Hypothyroidism, unspecified: Secondary | ICD-10-CM | POA: Insufficient documentation

## 2015-03-23 DIAGNOSIS — I10 Essential (primary) hypertension: Secondary | ICD-10-CM | POA: Insufficient documentation

## 2015-03-23 DIAGNOSIS — G4700 Insomnia, unspecified: Secondary | ICD-10-CM | POA: Insufficient documentation

## 2015-03-23 DIAGNOSIS — M791 Myalgia, unspecified site: Secondary | ICD-10-CM | POA: Insufficient documentation

## 2015-03-23 DIAGNOSIS — C549 Malignant neoplasm of corpus uteri, unspecified: Secondary | ICD-10-CM | POA: Insufficient documentation

## 2015-03-23 DIAGNOSIS — M1990 Unspecified osteoarthritis, unspecified site: Secondary | ICD-10-CM | POA: Insufficient documentation

## 2015-03-23 DIAGNOSIS — E119 Type 2 diabetes mellitus without complications: Secondary | ICD-10-CM | POA: Insufficient documentation

## 2015-03-23 DIAGNOSIS — R011 Cardiac murmur, unspecified: Secondary | ICD-10-CM | POA: Insufficient documentation

## 2015-03-23 DIAGNOSIS — F419 Anxiety disorder, unspecified: Secondary | ICD-10-CM | POA: Insufficient documentation

## 2015-03-23 DIAGNOSIS — N399 Disorder of urinary system, unspecified: Secondary | ICD-10-CM | POA: Insufficient documentation

## 2015-03-23 DIAGNOSIS — R5381 Other malaise: Secondary | ICD-10-CM | POA: Insufficient documentation

## 2015-04-06 DIAGNOSIS — H3531 Nonexudative age-related macular degeneration: Secondary | ICD-10-CM | POA: Diagnosis not present

## 2015-05-03 ENCOUNTER — Encounter: Payer: Self-pay | Admitting: Family Medicine

## 2015-05-03 ENCOUNTER — Ambulatory Visit (INDEPENDENT_AMBULATORY_CARE_PROVIDER_SITE_OTHER): Payer: Medicare Other | Admitting: Family Medicine

## 2015-05-03 VITALS — BP 120/58 | HR 68 | Temp 98.8°F | Resp 16 | Ht 65.0 in | Wt 162.0 lb

## 2015-05-03 DIAGNOSIS — E1140 Type 2 diabetes mellitus with diabetic neuropathy, unspecified: Secondary | ICD-10-CM | POA: Diagnosis not present

## 2015-05-03 LAB — POCT GLYCOSYLATED HEMOGLOBIN (HGB A1C): Hemoglobin A1C: 8.4

## 2015-05-03 NOTE — Progress Notes (Signed)
Patient ID: Jasmine Webster, female   DOB: December 29, 1925, 81 y.o.   MRN: [id number]   Jasmine Webster  MRN: [id number] DOB: 01/07/1926  Subjective:  Diabetes She presents for her follow-up diabetic visit. She has type 2 diabetes mellitus. Her disease course has been stable. There are no hypoglycemic associated symptoms. Pertinent negatives for diabetes include no blurred vision, no fatigue, no polydipsia, no polyphagia, no polyuria, no visual change and no weakness. There are no hypoglycemic complications. Symptoms are stable. There are no diabetic complications. Current diabetic treatment includes diet and insulin injections. She is compliant with treatment all of the time. She is following a generally healthy (patient reports that she has been doing healthier eating habits since starting insulin ) diet. She monitors blood glucose at home 1-2 x per day. Blood glucose monitoring compliance is good. There is no change in her home blood glucose trend. Her overall blood glucose range is 140-180 mg/dl. (Blood sugars usually under 150. This morning it was 147. )    Patient Active Problem List   Diagnosis Date Noted  . Anxiety 03/23/2015  . Arthritis 03/23/2015  . Cardiovascular disease 03/23/2015  . Urinary system disease 03/23/2015  . Malignant neoplasm of corpus uteri, except isthmus 03/23/2015  . Essential (primary) hypertension 03/23/2015  . Acid reflux 03/23/2015  . Cardiac murmur 03/23/2015  . Hypercholesteremia 03/23/2015  . Adult hypothyroidism 03/23/2015  . Cannot sleep 03/23/2015  . Malaise and fatigue 03/23/2015  . Muscle ache 03/23/2015  . Arthritis, degenerative 03/23/2015  . Diabetes mellitus, type 2 03/23/2015    No past medical history on file.  History   Social History  . Marital Status: Married    Spouse Name: N/A  . Number of Children: N/A  . Years of Education: N/A   Occupational History  . Not on file.   Social History Main Topics  . Smoking status: Never Smoker    . Smokeless tobacco: Not on file  . Alcohol Use: No  . Drug Use: No  . Sexual Activity: Not on file   Other Topics Concern  . Not on file   Social History Narrative    Outpatient Prescriptions Prior to Visit  Medication Sig Dispense Refill  . ALPRAZolam (XANAX) 0.5 MG tablet Take 1 tablet by mouth 2 (two) times daily as needed.    Marland Kitchen amLODipine (NORVASC) 5 MG tablet Take 1 tablet by mouth daily.    . Insulin Glargine (LANTUS) 100 UNIT/ML Solostar Pen Inject 30 Units into the skin at bedtime.    . Insulin Glargine 300 UNIT/ML SOPN Inject 5 Units into the skin daily.    Marland Kitchen omeprazole (PRILOSEC) 20 MG capsule Take 1 capsule by mouth daily.    . pravastatin (PRAVACHOL) 20 MG tablet Take 1 tablet by mouth at bedtime.    . quinapril-hydrochlorothiazide (ACCURETIC) 20-12.5 MG per tablet Take 1 tablet by mouth daily.     No facility-administered medications prior to visit.    Allergies  Allergen Reactions  . Latex   . Lamisil  [Terbinafine Hcl]   . Lodine  [Etodolac]   . Naproxen     Review of Systems  Constitutional: Negative.  Negative for fatigue.  HENT: Negative.   Eyes: Negative.  Negative for blurred vision.  Respiratory: Negative.   Genitourinary: Negative.   Musculoskeletal: Negative.   Skin: Negative.   Neurological: Negative.  Negative for weakness.  Endo/Heme/Allergies: Negative.  Negative for polydipsia and polyphagia.  Psychiatric/Behavioral: Negative.    Objective:  BP 120/58  mmHg  Pulse 68  Temp(Src) 98.8 F (37.1 C)  Resp 16  Ht 5\' 5"  (1.651 m)  Wt 162 lb (73.483 kg)  BMI 26.96 kg/m2  Physical Exam  Constitutional: She is oriented to person, place, and time and well-developed, well-nourished, and in no distress.  HENT:  Head: Normocephalic and atraumatic.  Right Ear: External ear normal.  Left Ear: External ear normal.  Nose: Nose normal.  Mouth/Throat: Oropharynx is clear and moist.  Eyes: Conjunctivae and EOM are normal. Pupils are equal, round,  and reactive to light.  Neck: Normal range of motion. Neck supple.  Cardiovascular: Normal rate, regular rhythm, normal heart sounds and intact distal pulses.   Pulmonary/Chest: Effort normal and breath sounds normal.  Abdominal: Soft.  Neurological: She is alert and oriented to person, place, and time. Gait normal.  Skin: Skin is warm and dry.  Psychiatric: Mood, memory, affect and judgment normal.  Nursing note and vitals reviewed.   Assessment and Plan :  Type 2 diabetes mellitus with diabetic neuropathy - Plan: POCT HgB A1C Increase insulin 2 units to make a total of 32 units daily. A1c today is 8.4. Last was 7.9. At 81 years old I would like to see A1c between 7 and 8. Below 7 puts her at risk of hypoglycemia, especially being on insulin therapy. Maintenance of A1c between 7.5 and 8 will be ideal. CAD Hypertension Hyperlipidemia  GERD  All risk factors treated. Miguel Aschoff MD [facility] 05/03/2015 1:46 PM

## 2015-05-07 ENCOUNTER — Encounter: Payer: Self-pay | Admitting: Family Medicine

## 2015-07-07 ENCOUNTER — Other Ambulatory Visit: Payer: Self-pay | Admitting: Family Medicine

## 2015-07-11 ENCOUNTER — Other Ambulatory Visit: Payer: Self-pay

## 2015-07-11 NOTE — Telephone Encounter (Signed)
fx from [facility] for Alprazolam. To fax manually fax number is [phone]

## 2015-07-12 MED ORDER — ALPRAZOLAM 0.5 MG PO TABS: 0.5000 mg | ORAL_TABLET | Freq: Two times a day (BID) | ORAL | Status: DC | PRN

## 2015-08-16 ENCOUNTER — Telehealth: Payer: Self-pay | Admitting: Family Medicine

## 2015-08-16 NOTE — Telephone Encounter (Signed)
Advised-aa

## 2015-08-16 NOTE — Telephone Encounter (Signed)
[facility] calling requesting  pt most recent AIC reading and Blood Pressure readings. CC

## 2015-09-04 ENCOUNTER — Ambulatory Visit (INDEPENDENT_AMBULATORY_CARE_PROVIDER_SITE_OTHER): Payer: Medicare Other | Admitting: Family Medicine

## 2015-09-04 VITALS — BP 114/58 | HR 84 | Temp 98.1°F | Resp 16 | Wt 165.0 lb

## 2015-09-04 DIAGNOSIS — E785 Hyperlipidemia, unspecified: Secondary | ICD-10-CM

## 2015-09-04 DIAGNOSIS — E119 Type 2 diabetes mellitus without complications: Secondary | ICD-10-CM | POA: Diagnosis not present

## 2015-09-04 DIAGNOSIS — Z794 Long term (current) use of insulin: Secondary | ICD-10-CM

## 2015-09-04 DIAGNOSIS — I10 Essential (primary) hypertension: Secondary | ICD-10-CM | POA: Diagnosis not present

## 2015-09-04 DIAGNOSIS — Z23 Encounter for immunization: Secondary | ICD-10-CM

## 2015-09-04 NOTE — Progress Notes (Signed)
Patient ID: Jasmine Webster, female   DOB: 1925-12-21, 81 y.o.   MRN: [id number]   SHAVONN GAULKE  MRN: [id number] DOB: 30-Jun-1926  Subjective:  HPI   1. Type 2 diabetes mellitus without complication, with long-term current use of insulin [facility]) Patient is an 81 year old female who presents today for follow up of her diabetes.  She was last seen on 05/03/15 and her A1C at that time was 8.4.  During that last visit she was instructed to increase her Lantus and she is now taking 32 units in the morning.  She checks her sugar at home daily and reports her readings have been between 120s to 175.  She reports good compliance and tolerance of her medications.  2. Essential hypertension Patient is also here for follow up of her hypertension.  She reports good compliance and tolerance of the BP medications.  She does not check her pressure outside of the office.    3. Hyperlipidemia Patient is on cholesterol medication and reports that she has changed her dietary habits and believes she has been able to get her level lowered.  She wishes to have her level checked today.  Patient Active Problem List   Diagnosis Date Noted  . Anxiety 03/23/2015  . Arthritis 03/23/2015  . Cardiovascular disease 03/23/2015  . Urinary system disease 03/23/2015  . Malignant neoplasm of corpus uteri, except isthmus ([place]) 03/23/2015  . Essential (primary) hypertension 03/23/2015  . Acid reflux 03/23/2015  . Cardiac murmur 03/23/2015  . Hypercholesteremia 03/23/2015  . Adult hypothyroidism 03/23/2015  . Cannot sleep 03/23/2015  . Malaise and fatigue 03/23/2015  . Muscle ache 03/23/2015  . Arthritis, degenerative 03/23/2015  . Diabetes mellitus, type 2 ([place]) 03/23/2015    No past medical history on file.  Social History   Social History  . Marital Status: Married    Spouse Name: N/A  . Number of Children: N/A  . Years of Education: N/A   Occupational History  . Not on file.   Social History Main Topics   . Smoking status: Never Smoker   . Smokeless tobacco: Not on file  . Alcohol Use: No  . Drug Use: No  . Sexual Activity: Not on file   Other Topics Concern  . Not on file   Social History Narrative    Outpatient Prescriptions Prior to Visit  Medication Sig Dispense Refill  . ALPRAZolam (XANAX) 0.5 MG tablet Take 1 tablet (0.5 mg total) by mouth 2 (two) times daily as needed. 60 tablet 4  . amLODipine (NORVASC) 5 MG tablet Take 1 tablet by mouth  daily 90 tablet 3  . Insulin Glargine (LANTUS) 100 UNIT/ML Solostar Pen Inject 30 Units into the skin at bedtime.    . Insulin Glargine 300 UNIT/ML SOPN Inject 5 Units into the skin daily.    Marland Kitchen omeprazole (PRILOSEC) 20 MG capsule Take 1 capsule by mouth daily.    . ONE TOUCH ULTRA TEST test strip Test daily 100 each 3  . pravastatin (PRAVACHOL) 20 MG tablet Take 1 tablet by mouth at  bedtime 90 tablet 3  . quinapril-hydrochlorothiazide (ACCURETIC) 20-12.5 MG per tablet Take 1 tablet by mouth two  times daily 180 tablet 3   No facility-administered medications prior to visit.    Allergies  Allergen Reactions  . Latex   . Lamisil  [Terbinafine Hcl]   . Lodine  [Etodolac]   . Naproxen     Review of Systems  Constitutional: Negative.   Respiratory:  Negative.   Cardiovascular: Negative.   Gastrointestinal: Negative.   Neurological: Negative for dizziness and headaches.  Endo/Heme/Allergies: Negative.  Negative for polydipsia.  Psychiatric/Behavioral: Negative.    Objective:  BP 114/58 mmHg  Pulse 84  Temp(Src) 98.1 F (36.7 C) (Oral)  Resp 16  Wt 165 lb (74.844 kg)  Physical Exam  Constitutional: She is oriented to person, place, and time and well-developed, well-nourished, and in no distress.  HENT:  Head: Normocephalic and atraumatic.  Right Ear: External ear normal.  Left Ear: External ear normal.  Nose: Nose normal.  Eyes: Conjunctivae are normal.  Neck: Neck supple.  Cardiovascular: Normal rate, regular rhythm and  normal heart sounds.   Pulmonary/Chest: Effort normal and breath sounds normal.  Abdominal: Soft.  Neurological: She is alert and oriented to person, place, and time.  Skin: Skin is warm and dry.  Psychiatric: Mood, memory, affect and judgment normal.    Assessment and Plan :  Type 2 diabetes mellitus without complication, with long-term current use of insulin (HCC) - Plan: Hemoglobin A1c  Essential hypertension - Plan: CBC With Differential/Platelet, COMPLETE METABOLIC PANEL WITH GFR, TSH  Hyperlipidemia - Plan: Lipid Panel With LDL/HDL Ratio Patient says she is having no hypoglycemia. Obtain other labs today for baseline with the hyperlipidemia and hypertension. He may be developing diabetic nephropathy.she is on Ace inhibitor Osteoarthritis and degenerative disc disease. Patient walks with a cane to lower her risk of fall. Miguel Aschoff MD [facility] 09/04/2015 1:56 PM

## 2015-09-05 DIAGNOSIS — I10 Essential (primary) hypertension: Secondary | ICD-10-CM | POA: Diagnosis not present

## 2015-09-05 DIAGNOSIS — Z794 Long term (current) use of insulin: Secondary | ICD-10-CM | POA: Diagnosis not present

## 2015-09-05 DIAGNOSIS — E119 Type 2 diabetes mellitus without complications: Secondary | ICD-10-CM | POA: Diagnosis not present

## 2015-09-05 DIAGNOSIS — E785 Hyperlipidemia, unspecified: Secondary | ICD-10-CM | POA: Diagnosis not present

## 2015-09-12 LAB — CBC WITH DIFFERENTIAL/PLATELET
BASOS: 1 %
Basophils Absolute: 0.1 10*3/uL (ref 0.0–0.2)
EOS (ABSOLUTE): 0.1 10*3/uL (ref 0.0–0.4)
EOS: 3 %
HEMATOCRIT: 39.9 % (ref 34.0–46.6)
HEMOGLOBIN: 13.1 g/dL (ref 11.1–15.9)
IMMATURE GRANS (ABS): 0 10*3/uL (ref 0.0–0.1)
IMMATURE GRANULOCYTES: 0 %
Lymphocytes Absolute: 1.5 10*3/uL (ref 0.7–3.1)
Lymphs: 29 %
MCH: 32.8 pg (ref 26.6–33.0)
MCHC: 32.8 g/dL (ref 31.5–35.7)
MCV: 100 fL — AB (ref 79–97)
MONOS ABS: 0.5 10*3/uL (ref 0.1–0.9)
Monocytes: 10 %
NEUTROS ABS: 2.9 10*3/uL (ref 1.4–7.0)
Neutrophils: 57 %
PLATELETS: 81 10*3/uL — AB (ref 150–379)
RBC: 4 x10E6/uL (ref 3.77–5.28)
RDW: 14.4 % (ref 12.3–15.4)
WBC: 5.1 10*3/uL (ref 3.4–10.8)

## 2015-09-12 LAB — COMPREHENSIVE METABOLIC PANEL
ALBUMIN: 4.3 g/dL (ref 3.5–4.7)
ALK PHOS: 99 IU/L (ref 39–117)
ALT: 18 IU/L (ref 0–32)
AST: 23 IU/L (ref 0–40)
Albumin/Globulin Ratio: 2 (ref 1.1–2.5)
BILIRUBIN TOTAL: 0.6 mg/dL (ref 0.0–1.2)
BUN / CREAT RATIO: 19 (ref 11–26)
BUN: 24 mg/dL (ref 8–27)
CHLORIDE: 104 mmol/L (ref 97–106)
CO2: 22 mmol/L (ref 18–29)
Calcium: 9 mg/dL (ref 8.7–10.3)
Creatinine, Ser: 1.29 mg/dL — ABNORMAL HIGH (ref 0.57–1.00)
GFR calc Af Amer: 43 mL/min/{1.73_m2} — ABNORMAL LOW (ref >59)
GFR calc non Af Amer: 37 mL/min/{1.73_m2} — ABNORMAL LOW (ref >59)
GLOBULIN, TOTAL: 2.1 g/dL (ref 1.5–4.5)
Glucose: 116 mg/dL — ABNORMAL HIGH (ref 65–99)
POTASSIUM: 4.4 mmol/L (ref 3.5–5.2)
SODIUM: 142 mmol/L (ref 136–144)
Total Protein: 6.4 g/dL (ref 6.0–8.5)

## 2015-09-12 LAB — TSH: TSH: 3.24 u[IU]/mL (ref 0.450–4.500)

## 2015-09-12 LAB — LIPID PANEL WITH LDL/HDL RATIO
Cholesterol, Total: 209 mg/dL — ABNORMAL HIGH (ref 100–199)
HDL: 56 mg/dL (ref >39)
LDL CALC: 113 mg/dL — AB (ref 0–99)
LDL/HDL RATIO: 2 ratio (ref 0.0–3.2)
Triglycerides: 199 mg/dL — ABNORMAL HIGH (ref 0–149)
VLDL CHOLESTEROL CAL: 40 mg/dL (ref 5–40)

## 2015-09-12 LAB — HEMOGLOBIN A1C
Est. average glucose Bld gHb Est-mCnc: 169 mg/dL
HEMOGLOBIN A1C: 7.5 % — AB (ref 4.8–5.6)

## 2015-09-13 ENCOUNTER — Telehealth: Payer: Self-pay

## 2015-09-13 NOTE — Telephone Encounter (Signed)
Patient is requesting lab results. Thanks

## 2015-09-14 NOTE — Telephone Encounter (Signed)
Patient advised that Dr Rosanna Randy is out of the office and has not signed off on the labs but will be back on Monday and we will call when he sigs off.  ED

## 2015-09-18 ENCOUNTER — Telehealth: Payer: Self-pay | Admitting: Family Medicine

## 2015-09-18 NOTE — Telephone Encounter (Signed)
Pt stated that she was returning Elena's call. Thanks TNP

## 2015-09-19 NOTE — Telephone Encounter (Signed)
Pt advised of lab results, please see lab note-aa

## 2015-09-19 NOTE — Telephone Encounter (Signed)
Pt is returning [place] call.  [phone]

## 2015-10-04 DIAGNOSIS — H3554 Dystrophies primarily involving the retinal pigment epithelium: Secondary | ICD-10-CM | POA: Diagnosis not present

## 2015-10-04 LAB — HM DIABETES EYE EXAM

## 2015-12-04 ENCOUNTER — Other Ambulatory Visit: Payer: Self-pay | Admitting: Family Medicine

## 2015-12-04 DIAGNOSIS — F419 Anxiety disorder, unspecified: Secondary | ICD-10-CM

## 2015-12-04 NOTE — Telephone Encounter (Signed)
Pt contacted office for refill request on the following medications:   ALPRAZolam (XANAX) 0.5 MG tablet.  90 day supply.  [place]  [id number]

## 2015-12-04 NOTE — Telephone Encounter (Signed)
Ok to refill 

## 2015-12-05 MED ORDER — ALPRAZOLAM 0.5 MG PO TABS: 0.5000 mg | ORAL_TABLET | Freq: Two times a day (BID) | ORAL | Status: DC | PRN

## 2015-12-05 NOTE — Telephone Encounter (Signed)
Med called into pharmacy. Pt informed.  

## 2015-12-05 NOTE — Telephone Encounter (Signed)
Okay for 90 days, no refills

## 2016-01-03 ENCOUNTER — Other Ambulatory Visit: Payer: Self-pay | Admitting: Family Medicine

## 2016-01-03 DIAGNOSIS — E118 Type 2 diabetes mellitus with unspecified complications: Secondary | ICD-10-CM

## 2016-01-03 MED ORDER — GLUCOSE BLOOD VI STRP
ORAL_STRIP | Status: DC
Start: 2016-01-03 — End: 2016-02-19

## 2016-01-03 NOTE — Telephone Encounter (Signed)
Ok to refill? Please advise. Thanks!  

## 2016-01-03 NOTE — Telephone Encounter (Signed)
Pt contacted office for refill request on the following medications:  ONE TOUCH ULTRA TEST test strip.  Pt is test 1 time a day.  [place]  [id number]

## 2016-01-09 ENCOUNTER — Ambulatory Visit (INDEPENDENT_AMBULATORY_CARE_PROVIDER_SITE_OTHER): Payer: PPO | Admitting: Family Medicine

## 2016-01-09 ENCOUNTER — Encounter: Payer: Self-pay | Admitting: Family Medicine

## 2016-01-09 VITALS — BP 102/58 | HR 66 | Temp 97.8°F | Resp 16 | Wt 166.0 lb

## 2016-01-09 DIAGNOSIS — D696 Thrombocytopenia, unspecified: Secondary | ICD-10-CM

## 2016-01-09 DIAGNOSIS — I10 Essential (primary) hypertension: Secondary | ICD-10-CM

## 2016-01-09 DIAGNOSIS — E118 Type 2 diabetes mellitus with unspecified complications: Secondary | ICD-10-CM | POA: Diagnosis not present

## 2016-01-09 LAB — POCT GLYCOSYLATED HEMOGLOBIN (HGB A1C): Hemoglobin A1C: 7.4

## 2016-01-09 NOTE — Progress Notes (Signed)
Patient ID: Jasmine Webster, female   DOB: Jul 25, 1926, 82 y.o.   MRN: [id number]    Subjective:  HPI  Diabetes Mellitus Type II, Follow-up:   Lab Results  Component Value Date   HGBA1C 7.5* 09/05/2015   HGBA1C 8.4 05/03/2015   HGBA1C 7.9* 02/03/2015    Last seen for diabetes 4 months ago.  Management since then includes none. She reports good compliance with treatment. She is not having side effects.  Current symptoms include none. Home blood sugar records: 115-130  Episodes of hypoglycemia? no   Current Insulin Regimen: 30 units lantus  Most Recent Eye Exam: about 6 months ago. Weight trend: stable Current exercise: walking  Pertinent Labs:    Component Value Date/Time   CHOL 209* 09/05/2015 0904   CHOL 240* 02/03/2015   TRIG 199* 09/05/2015 0904   HDL 56 09/05/2015 0904   HDL 53 02/03/2015   LDLCALC 113* 09/05/2015 0904   LDLCALC 126 02/03/2015   CREATININE 1.29* 09/05/2015 0904    Wt Readings from Last 3 Encounters:  01/09/16 166 lb (75.297 kg)  09/04/15 165 lb (74.844 kg)  05/03/15 162 lb (73.483 kg)    ------------------------------------------------------------------------    Hypertension, follow-up:  BP Readings from Last 3 Encounters:  01/09/16 102/58  09/04/15 114/58  05/03/15 120/58    She was last seen for hypertension 4 months ago.  BP at that visit was 114/58. Management since that visit includes none. She reports good compliance with treatment. She is not having side effects.  She is exercising. She is adherent to low salt diet.   Outside blood pressures are not being checked. She is experiencing none.  Patient denies chest pain, chest pressure/discomfort, claudication, dyspnea, fatigue, irregular heart beat, lower extremity edema, near-syncope, orthopnea and palpitations.     Wt Readings from Last 3 Encounters:  01/09/16 166 lb (75.297 kg)  09/04/15 165 lb (74.844 kg)  05/03/15 162 lb (73.483 kg)     ------------------------------------------------------------------------   Pt reports that she has back/side pain. Her left side started last week but that has gone away but yesterday her right side started hurting. Pt wants to make sure it is nothing more than a pulled muscle. She does have a history of back problems.  Prior to Admission medications   Medication Sig Start Date End Date Taking? Authorizing Provider  ALPRAZolam Duanne Moron) 0.5 MG tablet Take 1 tablet (0.5 mg total) by mouth 2 (two) times daily as needed. 12/05/15  Yes Richard Maceo Pro., MD  amLODipine (NORVASC) 5 MG tablet Take 1 tablet by mouth  daily 07/07/15  Yes Jerrol Banana., MD  glucose blood (ONE TOUCH ULTRA TEST) test strip Test daily 01/03/16  Yes Richard Maceo Pro., MD  Insulin Glargine (LANTUS) 100 UNIT/ML Solostar Pen Inject 30 Units into the skin at bedtime. 01/31/15  Yes Historical Provider, MD  Insulin Glargine 300 UNIT/ML SOPN Inject 5 Units into the skin daily. 01/24/15  Yes Historical Provider, MD  omeprazole (PRILOSEC) 20 MG capsule Take 1 capsule by mouth daily.   Yes Historical Provider, MD  pravastatin (PRAVACHOL) 20 MG tablet Take 1 tablet by mouth at  bedtime 07/07/15  Yes Richard Maceo Pro., MD  quinapril-hydrochlorothiazide [facility]) 20-12.5 MG per tablet Take 1 tablet by mouth two  times daily 07/07/15  Yes Richard Maceo Pro., MD    Patient Active Problem List   Diagnosis Date Noted  . Anxiety 03/23/2015  . Arthritis 03/23/2015  . Cardiovascular disease 03/23/2015  .  Urinary system disease 03/23/2015  . Malignant neoplasm of corpus uteri, except isthmus ([place]) 03/23/2015  . Essential (primary) hypertension 03/23/2015  . Acid reflux 03/23/2015  . Cardiac murmur 03/23/2015  . Hypercholesteremia 03/23/2015  . Adult hypothyroidism 03/23/2015  . Cannot sleep 03/23/2015  . Malaise and fatigue 03/23/2015  . Muscle ache 03/23/2015  . Arthritis, degenerative 03/23/2015  . Diabetes  mellitus, type 2 ([place]) 03/23/2015    History reviewed. No pertinent past medical history.  Social History   Social History  . Marital Status: Married    Spouse Name: N/A  . Number of Children: N/A  . Years of Education: N/A   Occupational History  . Not on file.   Social History Main Topics  . Smoking status: Never Smoker   . Smokeless tobacco: Not on file  . Alcohol Use: No  . Drug Use: No  . Sexual Activity: Not on file   Other Topics Concern  . Not on file   Social History Narrative    Allergies  Allergen Reactions  . Latex   . Lamisil  [Terbinafine Hcl]   . Lodine  [Etodolac]   . Naproxen     Review of Systems  Constitutional: Negative.   HENT: Negative.   Eyes: Negative.   Respiratory: Negative.   Cardiovascular: Negative.   Gastrointestinal: Negative.   Genitourinary: Negative.   Musculoskeletal: Positive for back pain (upper/mid back).  Skin: Negative.   Neurological: Negative.   Endo/Heme/Allergies: Negative.   Psychiatric/Behavioral: Negative.     Immunization History  Administered Date(s) Administered  . Influenza, High Dose Seasonal PF 09/04/2015  . Influenza-Unspecified 09/18/2013  . Pneumococcal Conjugate-13 12/27/2014  . Pneumococcal Polysaccharide-23 07/13/1998  . Td 03/29/2010   Objective:  BP 102/58 mmHg  Pulse 66  Temp(Src) 97.8 F (36.6 C) (Oral)  Resp 16  Wt 166 lb (75.297 kg)  Physical Exam  Constitutional: She is oriented to person, place, and time and well-developed, well-nourished, and in no distress.  Eyes: Conjunctivae and EOM are normal. Pupils are equal, round, and reactive to light.  Neck: Normal range of motion. Neck supple.  Cardiovascular: Normal rate, regular rhythm, normal heart sounds and intact distal pulses.   Pulmonary/Chest: Effort normal and breath sounds normal.  Abdominal: Soft. Bowel sounds are normal.  Musculoskeletal: Normal range of motion.  Neurological: She is alert and oriented to person,  place, and time. She has normal reflexes. Gait normal. GCS score is 15.  Skin: Skin is warm and dry.  Psychiatric: Mood, memory, affect and judgment normal.    Lab Results  Component Value Date   WBC 5.1 09/05/2015   HGB 12.3 12/27/2013   HCT 39.9 09/05/2015   PLT 81* 09/05/2015   GLUCOSE 116* 09/05/2015   CHOL 209* 09/05/2015   TRIG 199* 09/05/2015   HDL 56 09/05/2015   LDLCALC 113* 09/05/2015   TSH 3.240 09/05/2015   HGBA1C 7.5* 09/05/2015    CMP     Component Value Date/Time   NA 142 09/05/2015 0904   NA 136 05/21/2009 0653   K 4.4 09/05/2015 0904   CL 104 09/05/2015 0904   CO2 22 09/05/2015 0904   GLUCOSE 116* 09/05/2015 0904   GLUCOSE 50* 05/21/2009 0653   BUN 24 09/05/2015 0904   BUN 21 05/21/2009 0653   CREATININE 1.29* 09/05/2015 0904   CALCIUM 9.0 09/05/2015 0904   PROT 6.4 09/05/2015 0904   ALBUMIN 4.3 09/05/2015 0904   AST 23 09/05/2015 0904   ALT 18 09/05/2015 0904  ALKPHOS 99 09/05/2015 0904   BILITOT 0.6 09/05/2015 0904   GFRNONAA 37* 09/05/2015 0904   GFRAA 43* 09/05/2015 0904    Assessment and Plan :  1. Type 2 diabetes mellitus with complication, unspecified long term insulin use status (HCC)  - POCT HgB A1C-7.4 today. If pt has lower blood sugars will decrease Lantus to 28 units a day.  A1c goal of 7.5-8 2. Essential (primary) hypertension Stable   3. Thrombocytopenia ([place])  - CBC with Differential/Platelet  I have done the exam and reviewed the above chart and it is accurate to the best of my knowledge.  Patient was seen and examined by Dr. Miguel Aschoff, and noted scribed by Webb Laws, [place] MD [facility] 01/09/2016 1:46 PM

## 2016-01-10 LAB — CBC WITH DIFFERENTIAL/PLATELET
Basophils Absolute: 0.1 10*3/uL (ref 0.0–0.2)
Basos: 1 %
EOS (ABSOLUTE): 0.2 10*3/uL (ref 0.0–0.4)
Eos: 3 %
HEMATOCRIT: 41 % (ref 34.0–46.6)
HEMOGLOBIN: 13.4 g/dL (ref 11.1–15.9)
IMMATURE GRANS (ABS): 0 10*3/uL (ref 0.0–0.1)
Immature Granulocytes: 0 %
LYMPHS ABS: 2.4 10*3/uL (ref 0.7–3.1)
LYMPHS: 36 %
MCH: 31.1 pg (ref 26.6–33.0)
MCHC: 32.7 g/dL (ref 31.5–35.7)
MCV: 95 fL (ref 79–97)
Monocytes Absolute: 0.6 10*3/uL (ref 0.1–0.9)
Monocytes: 9 %
NEUTROS ABS: 3.4 10*3/uL (ref 1.4–7.0)
Neutrophils: 51 %
Platelets: 126 10*3/uL — ABNORMAL LOW (ref 150–379)
RBC: 4.31 x10E6/uL (ref 3.77–5.28)
RDW: 13.4 % (ref 12.3–15.4)
WBC: 6.6 10*3/uL (ref 3.4–10.8)

## 2016-01-22 ENCOUNTER — Other Ambulatory Visit: Payer: Self-pay | Admitting: Family Medicine

## 2016-01-22 MED ORDER — INSULIN PEN NEEDLE 31G X 8 MM MISC: Status: DC

## 2016-01-22 NOTE — Telephone Encounter (Signed)
rx sent in-aa 

## 2016-01-22 NOTE — Telephone Encounter (Signed)
Pt would like an RX for the ultra fine pen needles sent to [facility] for her insulin pen. Thanks TNP

## 2016-02-19 ENCOUNTER — Other Ambulatory Visit: Payer: Self-pay | Admitting: Family Medicine

## 2016-02-19 DIAGNOSIS — E118 Type 2 diabetes mellitus with unspecified complications: Secondary | ICD-10-CM

## 2016-02-19 MED ORDER — INSULIN GLARGINE 100 UNIT/ML SOLOSTAR PEN: 28.0000 [IU] | PEN_INJECTOR | Freq: Every day | SUBCUTANEOUS | Status: DC

## 2016-02-19 MED ORDER — PRAVASTATIN SODIUM 20 MG PO TABS: 20.0000 mg | ORAL_TABLET | Freq: Every day | ORAL | Status: DC

## 2016-02-19 MED ORDER — AMLODIPINE BESYLATE 5 MG PO TABS: 5.0000 mg | ORAL_TABLET | Freq: Every day | ORAL | Status: DC

## 2016-02-19 MED ORDER — GLUCOSE BLOOD VI STRP: ORAL_STRIP | Status: DC

## 2016-02-19 MED ORDER — INSULIN PEN NEEDLE 31G X 8 MM MISC: Status: DC

## 2016-02-19 MED ORDER — QUINAPRIL-HYDROCHLOROTHIAZIDE 20-12.5 MG PO TABS: 1.0000 | ORAL_TABLET | Freq: Two times a day (BID) | ORAL | Status: DC

## 2016-02-19 NOTE — Telephone Encounter (Signed)
Pt contacted office for refill request on the following medications:  [place].  [id number]  Insulin Glargine (LANTUS) 100 UNIT/ML Solostar Pen  glucose blood (ONE TOUCH ULTRA TEST) test strip  Insulin Pen Needle 31G X 8 MM MISC  quinapril-hydrochlorothiazide (ACCURETIC) 20-12.5 MG per tablet  pravastatin (PRAVACHOL) 20 MG tablet  amLODipine (NORVASC) 5 MG tablet

## 2016-02-19 NOTE — Telephone Encounter (Signed)
RXs sent in-aa

## 2016-03-06 DIAGNOSIS — E1142 Type 2 diabetes mellitus with diabetic polyneuropathy: Secondary | ICD-10-CM | POA: Diagnosis not present

## 2016-03-06 DIAGNOSIS — B351 Tinea unguium: Secondary | ICD-10-CM | POA: Diagnosis not present

## 2016-04-03 DIAGNOSIS — H3554 Dystrophies primarily involving the retinal pigment epithelium: Secondary | ICD-10-CM | POA: Diagnosis not present

## 2016-04-03 LAB — HM DIABETES EYE EXAM

## 2016-05-08 ENCOUNTER — Ambulatory Visit (INDEPENDENT_AMBULATORY_CARE_PROVIDER_SITE_OTHER): Payer: PPO | Admitting: Family Medicine

## 2016-05-08 ENCOUNTER — Encounter: Payer: Self-pay | Admitting: Family Medicine

## 2016-05-08 VITALS — BP 114/50 | HR 80 | Temp 98.3°F | Resp 12 | Wt 173.0 lb

## 2016-05-08 DIAGNOSIS — F419 Anxiety disorder, unspecified: Secondary | ICD-10-CM

## 2016-05-08 DIAGNOSIS — K219 Gastro-esophageal reflux disease without esophagitis: Secondary | ICD-10-CM | POA: Diagnosis not present

## 2016-05-08 DIAGNOSIS — E785 Hyperlipidemia, unspecified: Secondary | ICD-10-CM

## 2016-05-08 DIAGNOSIS — E118 Type 2 diabetes mellitus with unspecified complications: Secondary | ICD-10-CM | POA: Diagnosis not present

## 2016-05-08 DIAGNOSIS — D696 Thrombocytopenia, unspecified: Secondary | ICD-10-CM

## 2016-05-08 DIAGNOSIS — I10 Essential (primary) hypertension: Secondary | ICD-10-CM | POA: Diagnosis not present

## 2016-05-08 LAB — POCT GLYCOSYLATED HEMOGLOBIN (HGB A1C): HEMOGLOBIN A1C: 7.5

## 2016-05-08 MED ORDER — ALPRAZOLAM 0.5 MG PO TABS: 0.5000 mg | ORAL_TABLET | Freq: Every evening | ORAL | Status: DC | PRN

## 2016-05-08 NOTE — Progress Notes (Signed)
Patient ID: Jasmine Webster, female   DOB: 05-Apr-1926, 82 y.o.   MRN: [id number]    Subjective:  HPI  Patient is here for 4 months follow up.  Hypertension: she is not checking her b/p. No cardiac symptoms present. BP Readings from Last 3 Encounters:  05/08/16 114/50  01/09/16 102/58  09/04/15 114/58   Diabetes: She is checking her sugar for fasting and readings are around 150 or lower. No hypoglycemic episodes. Tingling in her feet still present. Lab Results  Component Value Date   HGBA1C 7.4 01/09/2016   GERD: Takes Omeprazole 20 mg 1 tablet about 3 times a week.  Prior to Admission medications   Medication Sig Start Date End Date Taking? Authorizing Provider  ALPRAZolam Duanne Moron) 0.5 MG tablet Take 1 tablet (0.5 mg total) by mouth 2 (two) times daily as needed. 12/05/15  Yes Saleema Weppler Maceo Pro., MD  amLODipine (NORVASC) 5 MG tablet Take 1 tablet (5 mg total) by mouth daily. 02/19/16  Yes Tyson Masin Maceo Pro., MD  glucose blood (ONE TOUCH ULTRA TEST) test strip Check sugar twice daily. 02/19/16  Yes Syndi Pua Maceo Pro., MD  Insulin Glargine (LANTUS) 100 UNIT/ML Solostar Pen Inject 28 Units into the skin at bedtime. 02/19/16  Yes Aditi Rovira Maceo Pro., MD  Insulin Pen Needle 31G X 8 MM MISC Check sugar 2 times daily, DX E11.9 (needs ultra fine pen needles) 02/19/16  Yes Jerrol Banana., MD  omeprazole (PRILOSEC) 20 MG capsule Take 1 capsule by mouth daily.   Yes Historical Provider, MD  pravastatin (PRAVACHOL) 20 MG tablet Take 1 tablet (20 mg total) by mouth daily. 02/19/16  Yes Avannah Decker Maceo Pro., MD  quinapril-hydrochlorothiazide (ACCURETIC) 20-12.5 MG tablet Take 1 tablet by mouth 2 (two) times daily. 02/19/16  Yes Azriel Dancy Maceo Pro., MD    Patient Active Problem List   Diagnosis Date Noted  . Anxiety 03/23/2015  . Arthritis 03/23/2015  . Cardiovascular disease 03/23/2015  . Urinary system disease 03/23/2015  . Malignant neoplasm of corpus uteri, except isthmus ([place])  03/23/2015  . Essential (primary) hypertension 03/23/2015  . Acid reflux 03/23/2015  . Cardiac murmur 03/23/2015  . Hypercholesteremia 03/23/2015  . Adult hypothyroidism 03/23/2015  . Cannot sleep 03/23/2015  . Malaise and fatigue 03/23/2015  . Muscle ache 03/23/2015  . Arthritis, degenerative 03/23/2015  . Diabetes mellitus, type 2 ([place]) 03/23/2015    No past medical history on file.  Social History   Social History  . Marital Status: Married    Spouse Name: N/A  . Number of Children: N/A  . Years of Education: N/A   Occupational History  . Not on file.   Social History Main Topics  . Smoking status: Never Smoker   . Smokeless tobacco: Never Used  . Alcohol Use: No  . Drug Use: No  . Sexual Activity: No   Other Topics Concern  . Not on file   Social History Narrative    Allergies  Allergen Reactions  . Latex   . Lamisil  [Terbinafine Hcl]   . Lodine  [Etodolac]   . Naproxen     Review of Systems  Constitutional: Negative.   Respiratory: Negative.   Cardiovascular: Negative.   Gastrointestinal: Negative.   Musculoskeletal: Positive for back pain.  Neurological: Positive for tingling (feet).    Immunization History  Administered Date(s) Administered  . Influenza, High Dose Seasonal PF 09/04/2015  . Influenza-Unspecified 09/18/2013  . Pneumococcal Conjugate-13 12/27/2014  . Pneumococcal Polysaccharide-23  07/13/1998  . Td 03/29/2010   Objective:  BP 114/50 mmHg  Pulse 80  Temp(Src) 98.3 F (36.8 C)  Resp 12  Wt 173 lb (78.472 kg)  Physical Exam  Constitutional: She is oriented to person, place, and time and well-developed, well-nourished, and in no distress.  HENT:  Head: Normocephalic and atraumatic.  Right Ear: External ear normal.  Left Ear: External ear normal.  Nose: Nose normal.  Eyes: Conjunctivae are normal. Pupils are equal, round, and reactive to light.  Neck: Normal range of motion. Neck supple.  Cardiovascular: Normal rate,  regular rhythm and intact distal pulses.   Murmur heard.  Systolic murmur is present with a grade of 2/6  Pulmonary/Chest: Effort normal and breath sounds normal. No respiratory distress. She has no wheezes.  Abdominal: Soft.  Musculoskeletal: She exhibits no edema or tenderness.  Neurological: She is alert and oriented to person, place, and time.  Skin: Skin is warm and dry.  Psychiatric: Mood, memory, affect and judgment normal.   Diabetic Foot Exam - Simple   Simple Foot Form  Diabetic Foot exam was performed with the following findings:  Yes 05/08/2016  2:06 PM  Visual Inspection  No deformities, no ulcerations, no other skin breakdown bilaterally:  Yes  Sensation Testing  Intact to touch and monofilament testing bilaterally:  Yes  Pulse Check  Posterior Tibialis and Dorsalis pulse intact bilaterally:  Yes  Comments      Lab Results  Component Value Date   WBC 6.6 01/09/2016   HGB 12.3 12/27/2013   HCT 41.0 01/09/2016   PLT 126* 01/09/2016   GLUCOSE 116* 09/05/2015   CHOL 209* 09/05/2015   TRIG 199* 09/05/2015   HDL 56 09/05/2015   LDLCALC 113* 09/05/2015   TSH 3.240 09/05/2015   HGBA1C 7.4 01/09/2016    CMP     Component Value Date/Time   NA 142 09/05/2015 0904   NA 136 05/21/2009 0653   K 4.4 09/05/2015 0904   CL 104 09/05/2015 0904   CO2 22 09/05/2015 0904   GLUCOSE 116* 09/05/2015 0904   GLUCOSE 50* 05/21/2009 0653   BUN 24 09/05/2015 0904   BUN 21 05/21/2009 0653   CREATININE 1.29* 09/05/2015 0904   CALCIUM 9.0 09/05/2015 0904   PROT 6.4 09/05/2015 0904   ALBUMIN 4.3 09/05/2015 0904   AST 23 09/05/2015 0904   ALT 18 09/05/2015 0904   ALKPHOS 99 09/05/2015 0904   BILITOT 0.6 09/05/2015 0904   GFRNONAA 37* 09/05/2015 0904   GFRAA 43* 09/05/2015 0904    Assessment and Plan :  1. Essential (primary) hypertension Stable. Continue current medication Discussed with patient using her cane even at home. Discussed fall risks and safety. 2. Type 2  diabetes mellitus with complication, unspecified long term insulin use status (HCC) A1C 7.5 today was 7.4.  Stable. Continue current regimen. - POCT HgB A1C  3. Thrombocytopenia ([place])  4. Hyperlipidemia  5. Anxiety Refill given. - ALPRAZolam (XANAX) 0.5 MG tablet; Take 1 tablet (0.5 mg total) by mouth at bedtime as needed.  Dispense: 30 tablet; Refill: 5 6.CAD All risk factors treated. Patient was seen and examined by Dr. Eulas Post and note was scribed by Theressa Millard, RMA.   Miguel Aschoff MD [facility] 05/08/2016 1:43 PM

## 2016-06-19 ENCOUNTER — Telehealth: Payer: Self-pay | Admitting: Family Medicine

## 2016-06-19 MED ORDER — MELOXICAM 7.5 MG PO TABS: 7.5000 mg | ORAL_TABLET | Freq: Every day | ORAL | 0 refills | Status: DC

## 2016-06-19 NOTE — Telephone Encounter (Signed)
She will need visit for discussion of options--while waiting on appt can try Meloxicam 7.5 mg daily,#30.

## 2016-06-19 NOTE — Telephone Encounter (Signed)
Medication was sent into the pharmacy.  

## 2016-06-19 NOTE — Telephone Encounter (Signed)
Advised patient as below. Patient has scheduled an appt. While trying to send in Meloxicam, an alert message came up because it says she is allergic to Naproxen. Ok to still send into the pharmacy? Please advise. Thanks!

## 2016-06-19 NOTE — Telephone Encounter (Signed)
Pt called wanting something called in for her chronic back pain.  She uses [facility].  Call  Back [phone]  Thanks [place]

## 2016-06-19 NOTE — Telephone Encounter (Signed)
Stop Naproxen and try Meloxicam until appt.

## 2016-06-20 ENCOUNTER — Ambulatory Visit (INDEPENDENT_AMBULATORY_CARE_PROVIDER_SITE_OTHER): Payer: PPO | Admitting: Family Medicine

## 2016-06-20 ENCOUNTER — Encounter: Payer: Self-pay | Admitting: Family Medicine

## 2016-06-20 VITALS — BP 128/62 | HR 80 | Temp 97.9°F | Resp 16 | Wt 168.0 lb

## 2016-06-20 DIAGNOSIS — R5382 Chronic fatigue, unspecified: Secondary | ICD-10-CM

## 2016-06-20 DIAGNOSIS — M791 Myalgia, unspecified site: Secondary | ICD-10-CM

## 2016-06-20 DIAGNOSIS — G8929 Other chronic pain: Secondary | ICD-10-CM

## 2016-06-20 DIAGNOSIS — M549 Dorsalgia, unspecified: Secondary | ICD-10-CM | POA: Diagnosis not present

## 2016-06-20 MED ORDER — CYCLOBENZAPRINE HCL 10 MG PO TABS: 10.0000 mg | ORAL_TABLET | Freq: Every day | ORAL | 0 refills | Status: DC

## 2016-06-20 NOTE — Patient Instructions (Addendum)
Discontinue Pravastatin

## 2016-06-20 NOTE — Progress Notes (Signed)
Patient: Jasmine Webster Female    DOB: 1926/10/27   82 y.o.   MRN: [id number] Visit Date: 06/20/2016  Today's Provider: [name], MD   Chief Complaint  Patient presents with  . Back Pain   Subjective:    HPI Patient comes in today to discuss treatment options for her chronic back pain. Patient reports that she was started on Meloxicam yesterday and it has helped a little.  She has had the pain for years but is slowly become progressively worse. She states that it is an 8/10 when it is at its worse. It is mainly in the low back. Very little pain down her legs.    Allergies  Allergen Reactions  . Lamisil  [Terbinafine Hcl]   . Latex   . Lodine  [Etodolac]   . Naproxen    Current Meds  Medication Sig  . ALPRAZolam (XANAX) 0.5 MG tablet Take 1 tablet (0.5 mg total) by mouth at bedtime as needed.  Marland Kitchen amLODipine (NORVASC) 5 MG tablet Take 1 tablet (5 mg total) by mouth daily.  Marland Kitchen glucose blood (ONE TOUCH ULTRA TEST) test strip Check sugar twice daily.  . Insulin Glargine (LANTUS) 100 UNIT/ML Solostar Pen Inject 28 Units into the skin at bedtime.  . Insulin Pen Needle 31G X 8 MM MISC Check sugar 2 times daily, DX E11.9 (needs ultra fine pen needles)  . meloxicam (MOBIC) 7.5 MG tablet Take 1 tablet (7.5 mg total) by mouth daily.  Marland Kitchen omeprazole (PRILOSEC) 20 MG capsule Take 1 capsule by mouth daily.  . pravastatin (PRAVACHOL) 20 MG tablet Take 1 tablet (20 mg total) by mouth daily.  . quinapril-hydrochlorothiazide (ACCURETIC) 20-12.5 MG tablet Take 1 tablet by mouth 2 (two) times daily.    Review of Systems  Constitutional: Negative.   Respiratory: Negative.   Musculoskeletal: Positive for arthralgias and back pain. Negative for joint swelling, myalgias, neck pain and neck stiffness.  Neurological: Negative.     Social History  Substance Use Topics  . Smoking status: Never Smoker  . Smokeless tobacco: Never Used  . Alcohol use No   Objective:   BP 128/62 (BP  Location: Right Arm, Patient Position: Sitting, Cuff Size: Normal)   Pulse 80   Temp 97.9 F (36.6 C)   Resp 16   Wt 168 lb (76.2 kg)   BMI 27.96 kg/m   Physical Exam  Constitutional: She is oriented to person, place, and time. She appears well-developed and well-nourished.  Cardiovascular: Normal rate, regular rhythm and normal heart sounds.   Pulmonary/Chest: Effort normal and breath sounds normal. No respiratory distress.  Musculoskeletal: She exhibits tenderness.  Patient experiences pain when palpitating in lower back.   Neurological: She is alert and oriented to person, place, and time. She displays normal reflexes. She exhibits normal muscle tone. Coordination normal.  Skin: Skin is warm and dry.  Psychiatric: She has a normal mood and affect. Her behavior is normal. Judgment and thought content normal.        Assessment & Plan:     1. Chronic back pain Patient rates pain a 8 on a pain scale of 1-10. I feel that the patient will benefit from physical therapy. Also recommend that she continue Meloxicam 7.5mg  daily.  - Ambulatory referral to Physical Therapy Discussed pain control for chronic pain with the patient. Discussed that we would not eliminate it but we can cut it in half that would be a beneficial thing to keep  her functional. 2. Chronic fatigue Worsening. F/U pending labs.  - Comprehensive metabolic panel - CBC with Differential/Platelet - TSH  3. Myalgia Patient has some muscle tenderness towards her lower back. Will start Flexeril and discontinue Pravastatin to see if this improves.  - CK - cyclobenzaprine (FLEXERIL) 10 MG tablet; Take 1 tablet (10 mg total) by mouth at bedtime.  Dispense: 30 tablet; Refill: 0 4. Type 2 diabetes      [name], MD  [facility]

## 2016-06-21 LAB — CBC WITH DIFFERENTIAL/PLATELET
BASOS ABS: 0.1 10*3/uL (ref 0.0–0.2)
Basos: 1 %
EOS (ABSOLUTE): 0.1 10*3/uL (ref 0.0–0.4)
Eos: 2 %
Hematocrit: 37.9 % (ref 34.0–46.6)
Hemoglobin: 12.7 g/dL (ref 11.1–15.9)
Immature Grans (Abs): 0 10*3/uL (ref 0.0–0.1)
Immature Granulocytes: 0 %
LYMPHS ABS: 2.4 10*3/uL (ref 0.7–3.1)
Lymphs: 37 %
MCH: 32.5 pg (ref 26.6–33.0)
MCHC: 33.5 g/dL (ref 31.5–35.7)
MCV: 97 fL (ref 79–97)
MONOS ABS: 0.7 10*3/uL (ref 0.1–0.9)
Monocytes: 10 %
Neutrophils Absolute: 3.2 10*3/uL (ref 1.4–7.0)
Neutrophils: 50 %
RBC: 3.91 x10E6/uL (ref 3.77–5.28)
RDW: 13.8 % (ref 12.3–15.4)
WBC: 6.5 10*3/uL (ref 3.4–10.8)

## 2016-06-21 LAB — COMPREHENSIVE METABOLIC PANEL
ALBUMIN: 4.3 g/dL (ref 3.5–4.7)
ALK PHOS: 124 IU/L — AB (ref 39–117)
ALT: 24 IU/L (ref 0–32)
AST: 30 IU/L (ref 0–40)
Albumin/Globulin Ratio: 1.7 (ref 1.2–2.2)
BILIRUBIN TOTAL: 0.4 mg/dL (ref 0.0–1.2)
BUN / CREAT RATIO: 26 (ref 12–28)
BUN: 31 mg/dL — ABNORMAL HIGH (ref 8–27)
CHLORIDE: 100 mmol/L (ref 96–106)
CO2: 21 mmol/L (ref 18–29)
Calcium: 9.3 mg/dL (ref 8.7–10.3)
Creatinine, Ser: 1.21 mg/dL — ABNORMAL HIGH (ref 0.57–1.00)
GFR calc Af Amer: 46 mL/min/{1.73_m2} — ABNORMAL LOW (ref >59)
GFR calc non Af Amer: 40 mL/min/{1.73_m2} — ABNORMAL LOW (ref >59)
GLOBULIN, TOTAL: 2.6 g/dL (ref 1.5–4.5)
GLUCOSE: 278 mg/dL — AB (ref 65–99)
Potassium: 4.6 mmol/L (ref 3.5–5.2)
SODIUM: 140 mmol/L (ref 134–144)
Total Protein: 6.9 g/dL (ref 6.0–8.5)

## 2016-06-21 LAB — TSH: TSH: 2.27 u[IU]/mL (ref 0.450–4.500)

## 2016-06-21 LAB — CK: Total CK: 340 U/L — ABNORMAL HIGH (ref 24–173)

## 2016-07-02 ENCOUNTER — Ambulatory Visit: Payer: PPO | Attending: Family Medicine | Admitting: Physical Therapy

## 2016-07-02 DIAGNOSIS — M25552 Pain in left hip: Secondary | ICD-10-CM | POA: Insufficient documentation

## 2016-07-02 DIAGNOSIS — M545 Low back pain, unspecified: Secondary | ICD-10-CM

## 2016-07-03 NOTE — Therapy (Signed)
[facility] [id number], [place], [address] Phone: [phone]   Fax:  [phone]  Physical Therapy Evaluation  Patient Details  Name: Jasmine Webster MRN: [id number] Date of Birth: 23-Aug-1926 No Data Recorded  Encounter Date: 07/02/2016      PT End of Session - 07/02/16 1627    Visit Number 1   Number of Visits 9   Date for PT Re-Evaluation 12-Aug-2016   Authorization Type G codes 1/10   PT Start Time 02/14/33   PT Stop Time 1622   PT Time Calculation (min) 48 min   Activity Tolerance Patient tolerated treatment well   Behavior During Therapy [facility] for tasks assessed/performed      No past medical history on file.  Past Surgical History:  Procedure Laterality Date  . ABDOMINAL HYSTERECTOMY    . APPENDECTOMY    . BACK SURGERY     three  . CATARACT EXTRACTION    . THORACOLUMBAR [place]      There were no vitals filed for this visit.       Subjective Assessment - 07/02/16 1632    Subjective Patient reports chronic lower back pain, which appears to have worsened and manifested as L hip pain recently (past 2 weeks). She reports pain in hip/leg is worst when laying down and sleeping. No fevers or chills and no unexplained weight loss. She reports some pain in L knee as well. She reports she has pain while lifting objects off the floor, does not recall anything starting her symptoms.    Pertinent History Patient has a history of 4 lumbar and cervical spine surgeries.   Limitations Lifting;Standing;Walking;House hold activities   Diagnostic tests none recently.    Patient Stated Goals Perform ADLs without as much pain, go out to eat with friends with minimal discomfort.    Currently in Pain? Yes   Pain Score 4    Pain Location Hip   Pain Orientation Left;Posterior   Pain Type Acute pain   Pain Onset 1 to 4 weeks ago   Pain Frequency Intermittent   Aggravating  Factors  Laying down, doing too much in standing.    Pain Relieving Factors Getting up and walking short distances.             [facility] PT Assessment - 07/02/16 1628      Assessment   Medical Diagnosis --  Low back pain     Precautions   Precautions None     Restrictions   Weight Bearing Restrictions No     Balance Screen   Has the patient fallen in the past 6 months No     Prior Function   Level of Independence Independent with community mobility with device   Vocation Retired   Leisure --  Goes to eat with family/friends, does laundry/housework     Cognition   Overall Cognitive Status Within Functional Limits for tasks assessed     Observation/Other Assessments   Modified [place]    Did not fill out first two questions     Sensation   Light Touch Appears Intact     Sit to Stand   Comments --  Anterior tibia, rounding of lumbar spine to get items floor     Posture/Postural Control   Posture/Postural Control Postural limitations   Postural Limitations --  Flattened lumbar, forward shoulders     FABER test   findings Negative  Slump test   Findings Negative     Straight Leg Raise   Findings Negative     Transfers   Comments --  Attempts to sit up to complete rolling/turning     Ambulation/Gait   Gait Comments --  Decreased gait speed, uses SPC, no antalgia      Piriformis stretch 2 bouts x 12 repetitions directed by PT with report of significant relief afterwards   Isometric clamshell on LLE in sidelying x 12 repetitions for 3 sets   Assessed sit to stand without use of hands as part of HEP to educate patient to pick up objects without flexing at lumbar spine. Patient reported she believes the rounding in lumbar spine during lifting tasks is why she is having pain. She is able to pick up 5# DB on 3 attempts with cuing for keeping trunk in neutral and bending through hips/knees without pain.                      PT Education -  07/02/16 1609    Education provided Yes   Education Details HEP, how to pick up objects off the floor.    Person(s) Educated Patient   Methods Explanation;Demonstration;Handout   Comprehension Verbalized understanding;Returned demonstration;Tactile cues required;Verbal cues required             PT Long Term Goals - 07/03/16 1233      PT LONG TERM GOAL #1   Title Patient will report worst pain of less than 3/10 on VAS to demonstrate improved tolerance for ADLs.    Time 4   Period Weeks   Status New     PT LONG TERM GOAL #2   Title Patient will report modified ODI score of less than 22% disability to demonstrate improved tolerance for ADLs.    Baseline 44% without completing first 2 sections   Time 4   Period Weeks   Status New     PT LONG TERM GOAL #3   Title Patient will lift 10# object off the floor with no increase in pain.    Time 4   Period Weeks   Status New               Plan - 07/03/16 1236    Clinical Impression Statement Patient demonstrates lumbar flexion in resting posture as well as lifting posture, likely leading to lumbar pain patient is complaining of. She is complaining of L hip pain, which appears to be in the area of the piriformis and is not reported after piriformis stretching was performed by therapist. Patient would benefit from skilled rehab services to allow her to complete her ADLs with decreased pain.    Rehab Potential Good   Clinical Impairments Affecting Rehab Potential Multiple lumbar surgeries in the past, good attitude.    PT Frequency 2x / week   PT Duration 4 weeks   PT Treatment/Interventions Balance training;Therapeutic exercise;Therapeutic activities;Manual techniques;Gait training;Patient/family education   PT Next Visit Plan Re-assess L hip pain, directional preference, lifting mechanics.    PT Home Exercise Plan Sit to stand without hands, piriformis stretching, sidelying clamshells with band.    Consulted and Agree with  Plan of Care Patient      Patient will benefit from skilled therapeutic intervention in order to improve the following deficits and impairments:  Pain, Difficulty walking, Improper body mechanics, Decreased strength, Decreased endurance  Visit Diagnosis: Midline low back pain without sciatica - Plan: PT plan of care cert/re-cert  Pain in left hip - Plan: PT plan of care cert/re-cert      G-Codes - [id number]    Functional Assessment Tool Used 35m walk time, modified ODI   Functional Limitation Mobility: Walking and moving around   Mobility: Walking and Moving Around Current Status [phone]) At least 20 percent but less than 40 percent impaired, limited or restricted   Mobility: Walking and Moving Around Goal Status [phone]) 0 percent impaired, limited or restricted       Problem List Patient Active Problem List   Diagnosis Date Noted  . Anxiety 03/23/2015  . Arthritis 03/23/2015  . Cardiovascular disease 03/23/2015  . Urinary system disease 03/23/2015  . Malignant neoplasm of corpus uteri, except isthmus ([place]) 03/23/2015  . Essential (primary) hypertension 03/23/2015  . Acid reflux 03/23/2015  . Cardiac murmur 03/23/2015  . Hypercholesteremia 03/23/2015  . Adult hypothyroidism 03/23/2015  . Cannot sleep 03/23/2015  . Malaise and fatigue 03/23/2015  . Muscle ache 03/23/2015  . Arthritis, degenerative 03/23/2015  . Diabetes mellitus, type 2 ([place]) 03/23/2015   Kerman Passey, PT, DPT    07/03/2016, 12:53 PM  [facility] [address] [place], [address] Phone: [phone]   Fax:  [phone]  Name: VEDIKA ALVARENGA MRN: [id number] Date of Birth: July 08, 1926

## 2016-07-04 ENCOUNTER — Ambulatory Visit: Payer: PPO | Admitting: Physical Therapy

## 2016-07-04 DIAGNOSIS — M25552 Pain in left hip: Secondary | ICD-10-CM

## 2016-07-04 DIAGNOSIS — M545 Low back pain, unspecified: Secondary | ICD-10-CM

## 2016-07-04 NOTE — Therapy (Signed)
[place] [facility] [address], [place], [address] Phone: [phone]   Fax:  [phone]  Physical Therapy Treatment  Patient Details  Name: Jasmine Webster MRN: [id number] Date of Birth: 09/25/1926 No Data Recorded  Encounter Date: 07/04/2016      PT End of Session - 07/04/16 1554    Visit Number 2   Number of Visits 9   Date for PT Re-Evaluation 14-Aug-2016   Authorization Type G codes 01-05-2023   PT Start Time [id number]   PT Stop Time 1533   PT Time Calculation (min) 18 min   Activity Tolerance Patient tolerated treatment well   Behavior During Therapy [facility] for tasks assessed/performed      No past medical history on file.  Past Surgical History:  Procedure Laterality Date  . ABDOMINAL HYSTERECTOMY    . APPENDECTOMY    . BACK SURGERY     three  . CATARACT EXTRACTION    . THORACOLUMBAR [place]      There were no vitals filed for this visit.      Subjective Assessment - 07/04/16 1515    Subjective Patient reports she had intense L knee pain yesterday but this lasted only in the morning and after that she has not had any pain since that time. She reports she was able to sleep through the night.    Pertinent History Patient has a history of 4 lumbar and cervical spine surgeries.   Limitations Lifting;Standing;Walking;House hold activities   Diagnostic tests none recently.    Patient Stated Goals Perform ADLs without as much pain, go out to eat with friends with minimal discomfort.    Currently in Pain? No/denies        Able to ascend/descend steps reciprocally with HHA on L with no pain  No pain noted with fast and slow ambulation.   Progressed sidelying clamshells to green t-band x 12, x 10 repetitions on LLE only, no pain reported   Piriformis stretch in supine by PT x 10 with 5" holds, pulling/stretching in posterior hip reported, no pain noted.                            PT Education - 07/04/16 1551    Education provided Yes   Education Details Progress to green t-band, see PT next week Thursday    Person(s) Educated Patient   Methods Explanation;Demonstration;Handout   Comprehension Verbalized understanding;Returned demonstration             PT Long Term Goals - 07/03/16 1233      PT LONG TERM GOAL #1   Title Patient will report worst pain of less than 3/10 on VAS to demonstrate improved tolerance for ADLs.    Time 4   Period Weeks   Status New     PT LONG TERM GOAL #2   Title Patient will report modified ODI score of less than 22% disability to demonstrate improved tolerance for ADLs.    Baseline 44% without completing first 2 sections   Time 4   Period Weeks   Status New     PT LONG TERM GOAL #3   Title Patient will lift 10# object off the floor with no increase in pain.    Time 4   Period Weeks   Status New  Plan - 07/04/16 1554    Clinical Impression Statement Patient reports no further symptoms, reports she is back to her baseline physically, able to ascend/descend steps reciprocally and ambulate at fast speed with no increase in pain. She reports she has "been cured", appears able to complete all ADLs without complaint. So long as she maintains this improvement will likely dc next visit.    Rehab Potential Good   Clinical Impairments Affecting Rehab Potential Multiple lumbar surgeries in the past, good attitude.    PT Frequency 2x / week   PT Duration 4 weeks   PT Treatment/Interventions Balance training;Therapeutic exercise;Therapeutic activities;Manual techniques;Gait training;Patient/family education   PT Next Visit Plan Re-assess L hip pain, directional preference, lifting mechanics.    PT Home Exercise Plan Sit to stand without hands, piriformis stretching, sidelying clamshells with band.    Consulted and Agree with Plan of Care Patient      Patient  will benefit from skilled therapeutic intervention in order to improve the following deficits and impairments:  Pain, Difficulty walking, Improper body mechanics, Decreased strength, Decreased endurance  Visit Diagnosis: Midline low back pain without sciatica  Pain in left hip     Problem List Patient Active Problem List   Diagnosis Date Noted  . Anxiety 03/23/2015  . Arthritis 03/23/2015  . Cardiovascular disease 03/23/2015  . Urinary system disease 03/23/2015  . Malignant neoplasm of corpus uteri, except isthmus ([place]) 03/23/2015  . Essential (primary) hypertension 03/23/2015  . Acid reflux 03/23/2015  . Cardiac murmur 03/23/2015  . Hypercholesteremia 03/23/2015  . Adult hypothyroidism 03/23/2015  . Cannot sleep 03/23/2015  . Malaise and fatigue 03/23/2015  . Muscle ache 03/23/2015  . Arthritis, degenerative 03/23/2015  . Diabetes mellitus, type 2 ([place]) 03/23/2015   Kerman Passey, PT, DPT    07/04/2016, 4:04 PM  [place] [facility] [address] [place], [address] Phone: [phone]   Fax:  [phone]  Name: KARALINE WIEBKE MRN: [id number] Date of Birth: 1926/04/15

## 2016-07-04 NOTE — Patient Instructions (Signed)
Able to ascend/descend steps reciprocally with HHA on L with no pain  No pain noted with fast and slow ambulation.

## 2016-07-10 ENCOUNTER — Ambulatory Visit: Payer: PPO | Admitting: Physical Therapy

## 2016-07-10 DIAGNOSIS — M25552 Pain in left hip: Secondary | ICD-10-CM

## 2016-07-10 DIAGNOSIS — M545 Low back pain, unspecified: Secondary | ICD-10-CM

## 2016-07-10 NOTE — Patient Instructions (Signed)
Standing hip abductions x 10 repetitions with red band bilaterally (not challenging) progressed to x10 with green t-band (more challenging)   Supine bridging x 5, attempted with single leg for concentric, too difficult thus regressed to marching x 8  Educated patient on lifting technique with box, instructed to keep box as close to her as possible.   Assessed sit to stands, quick/brisk progressed to modified single leg sit to stand with use of UEs on thighs, on pillow with contralateral LE for balance -- able to complete though difficult.

## 2016-07-10 NOTE — Therapy (Signed)
[place] [facility] [address] [place], [address] Phone: [phone]   Fax:  [phone]  Physical Therapy Treatment  Patient Details  Name: Jasmine Webster MRN: [id number] Date of Birth: 03/20/1926 No Data Recorded  Encounter Date: 07/10/2016      PT End of Session - 07/10/16 1302    Visit Number 3   Number of Visits 9   Date for PT Re-Evaluation 2016/08/25   Authorization Type G codes 14-Feb-2023   PT Start Time 0930   PT Stop Time 1010   PT Time Calculation (min) 40 min   Activity Tolerance Patient tolerated treatment well   Behavior During Therapy [facility] for tasks assessed/performed      No past medical history on file.  Past Surgical History:  Procedure Laterality Date  . ABDOMINAL HYSTERECTOMY    . APPENDECTOMY    . BACK SURGERY     three  . CATARACT EXTRACTION    . THORACOLUMBAR [place]      There were no vitals filed for this visit.      Subjective Assessment - 07/10/16 1300    Subjective Patient reports she has not had any of the pain she sought treatment for initially. She has a chronic low back pain history with lifting objects or prolonged sitting/standing however she thinks she will have this for life.    Pertinent History Patient has a history of 4 lumbar and cervical spine surgeries.   Limitations Lifting;Standing;Walking;House hold activities   Diagnostic tests none recently.    Patient Stated Goals Perform ADLs without as much pain, go out to eat with friends with minimal discomfort.    Currently in Pain? No/denies      Standing hip abductions x 10 repetitions with red band bilaterally (not challenging) progressed to x10 with green t-band (more challenging)   Supine bridging x 5, attempted with single leg for concentric, too difficult thus regressed to marching x 8  Educated patient on lifting technique with box, instructed to keep box as close  to her as possible.   Assessed sit to stands, quick/brisk progressed to modified single leg sit to stand with use of UEs on thighs, on pillow with contralateral LE for balance -- able to complete though difficult.                             PT Education - 07/10/16 1302    Education provided Yes   Education Details HEP for discharge, lifting mechanics.    Person(s) Educated Patient   Methods Explanation;Demonstration;Handout;Verbal cues   Comprehension Returned demonstration;Verbalized understanding             PT Long Term Goals - 07/03/16 1233      PT LONG TERM GOAL #1   Title Patient will report worst pain of less than 3/10 on VAS to demonstrate improved tolerance for ADLs.    Time 4   Period Weeks   Status New     PT LONG TERM GOAL #2   Title Patient will report modified ODI score of less than 22% disability to demonstrate improved tolerance for ADLs.    Baseline 44% without completing first 2 sections   Time 4   Period Weeks   Status New     PT LONG TERM GOAL #3   Title Patient will lift 10# object off the floor with no  increase in pain.    Time 4   Period Weeks   Status New               Plan - 03-Aug-2016 1302    Clinical Impression Statement Patient reports she has returned to baseline regarding her symptoms. She has not had any further issues, aside from chronic low back pain she attributes to lifting mechanics. She was educated on proper lifting mechanics including keeping object closer to her, keeping her head up. She was provided with HEP and progressions to target hip extensor and abductor strength in sagittal and frontal planes.    Rehab Potential Good   Clinical Impairments Affecting Rehab Potential Multiple lumbar surgeries in the past, good attitude.    PT Frequency 2x / week   PT Duration 4 weeks   PT Treatment/Interventions Balance training;Therapeutic exercise;Therapeutic activities;Manual techniques;Gait  training;Patient/family education   PT Next Visit Plan Re-assess L hip pain, directional preference, lifting mechanics.    PT Home Exercise Plan Sit to stand with weights, bridging/SL bridging, Standing hip abductions with band, Single leg sit to stand with use of UEs if needed.    Consulted and Agree with Plan of Care Patient      Patient will benefit from skilled therapeutic intervention in order to improve the following deficits and impairments:  Pain, Difficulty walking, Improper body mechanics, Decreased strength, Decreased endurance  Visit Diagnosis: Midline low back pain without sciatica  Pain in left hip       G-Codes - August 03, 2016 1302    Functional Assessment Tool Used Patient report.    Functional Limitation Mobility: Walking and moving around   Mobility: Walking and Moving Around Current Status [phone]) 0 percent impaired, limited or restricted   Mobility: Walking and Moving Around Discharge Status [phone]) 0 percent impaired, limited or restricted      Problem List Patient Active Problem List   Diagnosis Date Noted  . Anxiety 03/23/2015  . Arthritis 03/23/2015  . Cardiovascular disease 03/23/2015  . Urinary system disease 03/23/2015  . Malignant neoplasm of corpus uteri, except isthmus ([place]) 03/23/2015  . Essential (primary) hypertension 03/23/2015  . Acid reflux 03/23/2015  . Cardiac murmur 03/23/2015  . Hypercholesteremia 03/23/2015  . Adult hypothyroidism 03/23/2015  . Cannot sleep 03/23/2015  . Malaise and fatigue 03/23/2015  . Muscle ache 03/23/2015  . Arthritis, degenerative 03/23/2015  . Diabetes mellitus, type 2 ([place]) 03/23/2015    Kerman Passey, PT, DPT    August 03, 2016, 2:07 PM  [place] [address] [place], [address] Phone: [phone]   Fax:  [phone]  Name: Jasmine Webster MRN: [id number] Date of Birth: 01/02/1926

## 2016-07-25 ENCOUNTER — Ambulatory Visit (INDEPENDENT_AMBULATORY_CARE_PROVIDER_SITE_OTHER): Payer: PPO | Admitting: Family Medicine

## 2016-07-25 VITALS — BP 142/50 | HR 80 | Temp 98.0°F | Resp 16 | Wt 171.0 lb

## 2016-07-25 DIAGNOSIS — R05 Cough: Secondary | ICD-10-CM | POA: Diagnosis not present

## 2016-07-25 DIAGNOSIS — M791 Myalgia, unspecified site: Secondary | ICD-10-CM

## 2016-07-25 DIAGNOSIS — M545 Low back pain: Secondary | ICD-10-CM

## 2016-07-25 DIAGNOSIS — R5381 Other malaise: Secondary | ICD-10-CM | POA: Diagnosis not present

## 2016-07-25 DIAGNOSIS — R059 Cough, unspecified: Secondary | ICD-10-CM

## 2016-07-25 DIAGNOSIS — R5383 Other fatigue: Secondary | ICD-10-CM | POA: Diagnosis not present

## 2016-07-25 NOTE — Progress Notes (Signed)
Jasmine Webster  MRN: [id number] DOB: 09-May-1926  Subjective:  HPI  The patient is an 82 year old female who presents for follow up.   She was last seen on 06/20/16.  At that time she had complaints of back pain, muscle pain and fatigue.   The patient was given Flexeril and Meloxicam for her back pain and referred for physical therapy.  She reports that the PT has helped tremendously.  She has finished with the Meloxicam and for the last few days has not been taking any Flexeril.  The patient was instructed to discontinue Pravachol to see if this improved her muscle pain.  The patient states that her muscle pain is much improved.  On her labs that were done on 06/20/16 she was found to have an elevated CK of 340.    The patient feels her fatigue is greatly improved.  She is sleeping well and coping with things much better.    The patient thinks she has started getting sick with congestion, post nasal drainage (which is causing her some sore throat) and cough that is worse in the evenings.  She has not had any fever.  Patient Active Problem List   Diagnosis Date Noted  . Anxiety 03/23/2015  . Arthritis 03/23/2015  . Cardiovascular disease 03/23/2015  . Urinary system disease 03/23/2015  . Malignant neoplasm of corpus uteri, except isthmus ([place]) 03/23/2015  . Essential (primary) hypertension 03/23/2015  . Acid reflux 03/23/2015  . Cardiac murmur 03/23/2015  . Hypercholesteremia 03/23/2015  . Adult hypothyroidism 03/23/2015  . Cannot sleep 03/23/2015  . Malaise and fatigue 03/23/2015  . Muscle ache 03/23/2015  . Arthritis, degenerative 03/23/2015  . Diabetes mellitus, type 2 ([place]) 03/23/2015    No past medical history on file.  Social History   Social History  . Marital status: Married    Spouse name: N/A  . Number of children: N/A  . Years of education: N/A   Occupational History  . Not on file.   Social History Main Topics  . Smoking status: Never Smoker  . Smokeless  tobacco: Never Used  . Alcohol use No  . Drug use: No  . Sexual activity: No   Other Topics Concern  . Not on file   Social History Narrative  . No narrative on file    Outpatient Encounter Prescriptions as of 07/25/2016  Medication Sig Note  . ALPRAZolam (XANAX) 0.5 MG tablet Take 1 tablet (0.5 mg total) by mouth at bedtime as needed.   Marland Kitchen amLODipine (NORVASC) 5 MG tablet Take 1 tablet (5 mg total) by mouth daily.   Marland Kitchen glucose blood (ONE TOUCH ULTRA TEST) test strip Check sugar twice daily.   . Insulin Glargine (LANTUS) 100 UNIT/ML Solostar Pen Inject 28 Units into the skin at bedtime.   . Insulin Pen Needle 31G X 8 MM MISC Check sugar 2 times daily, DX E11.9 (needs ultra fine pen needles)   . omeprazole (PRILOSEC) 20 MG capsule Take 1 capsule by mouth daily. 03/23/2015: Received from: [facility]:   . quinapril-hydrochlorothiazide (ACCURETIC) 20-12.5 MG tablet Take 1 tablet by mouth 2 (two) times daily.   . [DISCONTINUED] cyclobenzaprine (FLEXERIL) 10 MG tablet Take 1 tablet (10 mg total) by mouth at bedtime.   . [DISCONTINUED] meloxicam (MOBIC) 7.5 MG tablet Take 1 tablet (7.5 mg total) by mouth daily.   . [DISCONTINUED] pravastatin (PRAVACHOL) 20 MG tablet Take 1 tablet (20 mg total) by mouth daily.  No facility-administered encounter medications on file as of 07/25/2016.     Allergies  Allergen Reactions  . Lamisil  [Terbinafine Hcl]   . Latex   . Lodine  [Etodolac]   . Naproxen     Review of Systems  Constitutional: Negative for malaise/fatigue.  HENT: Positive for congestion and sore throat.   Eyes: Negative.   Respiratory: Positive for cough (Mostly at night). Negative for sputum production, shortness of breath and wheezing.   Cardiovascular: Negative for chest pain, palpitations and leg swelling.  Gastrointestinal: Negative.   Musculoskeletal: Negative for back pain and myalgias.  Skin: Negative.   Neurological: Negative.  Negative for  dizziness, weakness and headaches.  Psychiatric/Behavioral: Negative.    Objective:  BP (!) 142/50   Pulse 80   Temp 98 F (36.7 C) (Oral)   Resp 16   Wt 171 lb (77.6 kg)   BMI 28.46 kg/m   Physical Exam  Constitutional: She is oriented to person, place, and time and well-developed, well-nourished, and in no distress.  HENT:  Head: Normocephalic and atraumatic.  Right Ear: External ear normal.  Left Ear: External ear normal.  Mouth/Throat: Oropharynx is clear and moist.  Eyes: Conjunctivae are normal. Pupils are equal, round, and reactive to light.  Neck: Normal range of motion. Neck supple.  Cardiovascular: Normal rate, regular rhythm and normal heart sounds.   Pulmonary/Chest: Effort normal and breath sounds normal.  Abdominal: Soft.  Neurological: She is alert and oriented to person, place, and time. No cranial nerve deficit. She exhibits normal muscle tone. Gait normal. Coordination normal.  Skin: Skin is warm and dry.  Psychiatric: Mood, memory, affect and judgment normal.    Assessment and Plan :  1. Muscle ache Improved, will remain off of statins for now.    2. Malaise and fatigue Improved.  3. Low back pain, unspecified back pain laterality, with sciatica presence unspecified Improved with physical therapy.  If patient has any more problems she is to let us know.  Continue with home back hygiene.  4. Cough Recommend that the patient try Robitussin DM and notify us if she worsens. 5. LS DDD  Stable 6. ASCVD  All risk factors treated 7. Type 2 diabetes 8. Hypertension 9. Hyperlipidemia Balancing risk factors I think stopping the statin is reasonable as she feels much better off of it. At 82 years old and we'll follow the lipids. On balance the patient feels much better and I think stopping the statin completely is the appropriate thing to do.   I have done the exam and reviewed the above chart and it is accurate to the best of my knowledge.  HPI, Exam and  A&P Transcribed under the direction and in the presence of [name]., MD. Electronically Signed: Althea Charon, [place]

## 2016-07-25 NOTE — Patient Instructions (Signed)
Patient is to try generic Robitussin DM for her cough and congestion.

## 2016-07-30 ENCOUNTER — Ambulatory Visit: Payer: PPO | Admitting: Family Medicine

## 2016-08-29 ENCOUNTER — Ambulatory Visit (INDEPENDENT_AMBULATORY_CARE_PROVIDER_SITE_OTHER): Payer: PPO

## 2016-08-29 DIAGNOSIS — Z23 Encounter for immunization: Secondary | ICD-10-CM | POA: Diagnosis not present

## 2016-09-06 ENCOUNTER — Ambulatory Visit (INDEPENDENT_AMBULATORY_CARE_PROVIDER_SITE_OTHER): Payer: PPO | Admitting: Family Medicine

## 2016-09-06 VITALS — BP 137/59 | HR 76 | Temp 98.3°F | Ht 63.25 in | Wt 168.5 lb

## 2016-09-06 DIAGNOSIS — Z0000 Encounter for general adult medical examination without abnormal findings: Secondary | ICD-10-CM | POA: Diagnosis not present

## 2016-09-06 NOTE — Progress Notes (Signed)
Subjective:   Jasmine Webster is a 82 y.o. female who presents for Medicare Annual (Subsequent) preventive examination.  Review of Systems:   Cardiac Risk Factors include: advanced age (>25men, >56 women);diabetes mellitus;dyslipidemia;hypertension     Objective:     Vitals: BP (!) 137/59 (BP Location: Right Arm)   Pulse 76   Temp 98.3 F (36.8 C)   Ht 5' 3.25" (1.607 m)   Wt 168 lb 8 oz (76.4 kg)   BMI 29.61 kg/m   Body mass index is 29.61 kg/m.   Tobacco History  Smoking Status  . Never Smoker  Smokeless Tobacco  . Never Used     Counseling given: No   History reviewed. No pertinent past medical history. Past Surgical History:  Procedure Laterality Date  . ABDOMINAL HYSTERECTOMY    . APPENDECTOMY    . BACK SURGERY     three  . CATARACT EXTRACTION    . THORACOLUMBAR SYRINGO SHUNT    . TONSILLECTOMY AND ADENOIDECTOMY     Family History  Problem Relation Age of Onset  . Heart disease Mother     died from MI  . Heart disease Sister   . Sudden death Brother     shot and beaten to death during home invasion  . Stroke Sister   . Sudden death Sister     MVA  . Cancer Brother     died from lung cancer  . Sudden death Sister     55 days old  . Cancer Brother     died from lung cancer  . Stroke Brother     cause of death  . Diabetes Father   . Stroke Father   . Pneumonia Father    History  Sexual Activity  . Sexual activity: No    Outpatient Encounter Prescriptions as of 09/06/2016  Medication Sig  . ALPRAZolam (XANAX) 0.5 MG tablet Take 1 tablet (0.5 mg total) by mouth at bedtime as needed.  Marland Kitchen amLODipine (NORVASC) 5 MG tablet Take 1 tablet (5 mg total) by mouth daily.  Marland Kitchen glucose blood (ONE TOUCH ULTRA TEST) test strip Check sugar twice daily.  . Insulin Glargine (LANTUS) 100 UNIT/ML Solostar Pen Inject 28 Units into the skin at bedtime. (Patient taking differently: Inject 30 Units into the skin at bedtime. )  . Insulin Pen Needle 31G X 8 MM MISC  Check sugar 2 times daily, DX E11.9 (needs ultra fine pen needles)  . omeprazole (PRILOSEC) 20 MG capsule Take 1 capsule by mouth daily.  . quinapril-hydrochlorothiazide (ACCURETIC) 20-12.5 MG tablet Take 1 tablet by mouth 2 (two) times daily.   No facility-administered encounter medications on file as of 09/06/2016.     Activities of Daily Living In your present state of health, do you have any difficulty performing the following activities: 09/06/2016 01/09/2016  Hearing? N N  Vision? N N  Difficulty concentrating or making decisions? Y N  Walking or climbing stairs? N Y  Dressing or bathing? N N  Doing errands, shopping? N N  Preparing Food and eating ? N -  Using the Toilet? N -  In the past six months, have you accidently leaked urine? N -  Do you have problems with loss of bowel control? N -  Managing your Medications? N -  Managing your Finances? N -  Housekeeping or managing your Housekeeping? N -  Some recent data might be hidden    Patient Care Team: Jerrol Banana., MD as PCP -  General (Family Medicine)    Assessment:     Exercise Activities and Dietary recommendations Current Exercise Habits: Home exercise routine, Type of exercise: stretching, Time (Minutes): 10, Frequency (Times/Week): 2, Weekly Exercise (Minutes/Week): 20, Intensity: Mild, Exercise limited by: None identified  Goals    . Exercise           Starting 09/06/16, I will increase my 10 minute exercises to twice a day.      Fall Risk Fall Risk  09/06/2016 01/09/2016  Falls in the past year? No No   Depression Screen PHQ 2/9 Scores 09/06/2016 01/09/2016  PHQ - 2 Score 0 0     Cognitive Function     6CIT Screen 09/06/2016  What Year? 0 points  What month? 0 points  What time? 0 points  Count back from 20 0 points  Months in reverse 4 points  Repeat phrase 6 points  Total Score 10    Immunization History  Administered Date(s) Administered  . Influenza, High Dose Seasonal PF  09/04/2015, 08/29/2016  . Influenza-Unspecified 09/18/2013  . Pneumococcal Conjugate-13 12/27/2014  . Pneumococcal Polysaccharide-23 07/13/1998  . Td 03/29/2010   Screening Tests Health Maintenance  Topic Date Due  . DEXA SCAN  09/06/2026 (Originally 06/12/1993)  . OPHTHALMOLOGY EXAM  10/03/2016  . HEMOGLOBIN A1C  11/07/2016  . FOOT EXAM  05/08/2017  . TETANUS/TDAP  03/29/2020  . INFLUENZA VACCINE  Completed  . ZOSTAVAX  Completed  . PNA vac Low Risk Adult  Completed   6CIT Screen 09/06/2016  What Year? 0 points  What month? 0 points  What time? 0 points  Count back from 20 0 points  Months in reverse 4 points  Repeat phrase 6 points  Total Score 10     Plan:  I have personally reviewed and addressed the Medicare Annual Wellness questionnaire and have noted the following in the patient's chart:  A. Medical and social history B. Use of alcohol, tobacco or illicit drugs  C. Current medications and supplements D. Functional ability and status E.  Nutritional status F.  Physical activity G. Advance directives H. List of other physicians I.  Hospitalizations, surgeries, and ER visits in previous 12 months J.  [place] such as hearing and vision if needed, cognitive and depression L. Referrals and appointments - none  In addition, I have reviewed and discussed with patient certain preventive protocols, quality metrics, and best practice recommendations. A written personalized care plan for preventive services as well as general preventive health recommendations were provided to patient.  See attached scanned questionnaire for additional information.   Signed,  Fabio Neighbors, LPN Nurse Health Advisor  I have reviewed the information as  put it by [facility] LPN and was available for any questions or concerns. Miguel Aschoff MD [facility]

## 2016-09-06 NOTE — Patient Instructions (Signed)
Ms. Ocon , Thank you for taking time to come for your Medicare Wellness Visit. I appreciate your ongoing commitment to your health goals. Please review the following plan we discussed and let me know if I can assist you in the future.   These are the goals we discussed: Goals    . Exercise           Starting 09/06/16, I will increase my 10 minute exercises to twice a day.       This is a list of the screening recommended for you and due dates:  Health Maintenance  Topic Date Due  . DEXA scan (bone density measurement)  09/06/2026*  . Eye exam for diabetics  10/03/2016  . Hemoglobin A1C  11/07/2016  . Complete foot exam   05/08/2017  . Tetanus Vaccine  03/29/2020  . Flu Shot  Completed  . Shingles Vaccine  Completed  . Pneumonia vaccines  Completed  *Topic was postponed. The date shown is not the original due date.   Preventive Care for Adults  A healthy lifestyle and preventive care can promote health and wellness. Preventive health guidelines for adults include the following key practices.  . A routine yearly physical is a good way to check with your health care provider about your health and preventive screening. It is a chance to share any concerns and updates on your health and to receive a thorough exam.  . Visit your dentist for a routine exam and preventive care every 6 months. Brush your teeth twice a day and floss once a day. Good oral hygiene prevents tooth decay and gum disease.  . The frequency of eye exams is based on your age, health, family medical history, use  of contact lenses, and other factors. Follow your health care provider's ecommendations for frequency of eye exams.  . Eat a healthy diet. Foods like vegetables, fruits, whole grains, low-fat dairy products, and lean protein foods contain the nutrients you need without too many calories. Decrease your intake of foods high in solid fats, added sugars, and salt. Eat the right amount of calories for you. Get  information about a proper diet from your health care provider, if necessary.  . Regular physical exercise is one of the most important things you can do for your health. Most adults should get at least 150 minutes of moderate-intensity exercise (any activity that increases your heart rate and causes you to sweat) each week. In addition, most adults need muscle-strengthening exercises on 2 or more days a week.  Silver Sneakers may be a benefit available to you. To determine eligibility, you may visit the website: www.silversneakers.com or contact program at 253-551-0915 Mon-Fri between 8AM-8PM.   . Maintain a healthy weight. The body mass index (BMI) is a screening tool to identify possible weight problems. It provides an estimate of body fat based on height and weight. Your health care provider can find your BMI and can help you achieve or maintain a healthy weight.   For adults 20 years and older: ? A BMI below 18.5 is considered underweight. ? A BMI of 18.5 to 24.9 is normal. ? A BMI of 25 to 29.9 is considered overweight. ? A BMI of 30 and above is considered obese.   . Maintain normal blood lipids and cholesterol levels by exercising and minimizing your intake of saturated fat. Eat a balanced diet with plenty of fruit and vegetables. Blood tests for lipids and cholesterol should begin at age 81 and be repeated  every 5 years. If your lipid or cholesterol levels are high, you are over 50, or you are at high risk for heart disease, you may need your cholesterol levels checked more frequently. Ongoing high lipid and cholesterol levels should be treated with medicines if diet and exercise are not working.  . If you smoke, find out from your health care provider how to quit. If you do not use tobacco, please do not start.  . If you choose to drink alcohol, please do not consume more than 2 drinks per day. One drink is considered to be 12 ounces (355 mL) of beer, 5 ounces (148 mL) of wine, or 1.5  ounces (44 mL) of liquor.  . If you are 68-26 years old, ask your health care provider if you should take aspirin to prevent strokes.  . Use sunscreen. Apply sunscreen liberally and repeatedly throughout the day. You should seek shade when your shadow is shorter than you. Protect yourself by wearing long sleeves, pants, a wide-brimmed hat, and sunglasses year round, whenever you are outdoors.  . Once a month, do a whole body skin exam, using a mirror to look at the skin on your back. Tell your health care provider of new moles, moles that have irregular borders, moles that are larger than a pencil eraser, or moles that have changed in shape or color.

## 2016-09-09 ENCOUNTER — Ambulatory Visit: Payer: PPO | Admitting: Family Medicine

## 2016-09-11 ENCOUNTER — Ambulatory Visit (INDEPENDENT_AMBULATORY_CARE_PROVIDER_SITE_OTHER): Payer: PPO | Admitting: Family Medicine

## 2016-09-11 ENCOUNTER — Encounter: Payer: Self-pay | Admitting: Family Medicine

## 2016-09-11 VITALS — BP 128/62 | HR 72 | Temp 98.2°F | Resp 16 | Wt 169.0 lb

## 2016-09-11 DIAGNOSIS — E7800 Pure hypercholesterolemia, unspecified: Secondary | ICD-10-CM | POA: Diagnosis not present

## 2016-09-11 DIAGNOSIS — E118 Type 2 diabetes mellitus with unspecified complications: Secondary | ICD-10-CM | POA: Diagnosis not present

## 2016-09-11 DIAGNOSIS — M1712 Unilateral primary osteoarthritis, left knee: Secondary | ICD-10-CM | POA: Diagnosis not present

## 2016-09-11 NOTE — Progress Notes (Signed)
Patient: Jasmine Webster, Female    DOB: Aug 27, 1926, 82 y.o.   MRN: [id number] Visit Date: 09/11/2016  Today's Provider: [name], MD   Chief Complaint  Patient presents with  . Annual Wellness Visit  . Hypertension  . Hyperlipidemia  . Leg Pain   Subjective:    Annual wellness visit Jasmine Webster is a 82 y.o. female. She feels well. She reports exercising every other day. She reports she is sleeping well. She was followed years ago by Dr. Rex Kras from cardiology for mild heart disease. She has not had her lipids checked in a year. Her main complaint today is one of left knee pain and upper calf pain.   Leg Pain: Patient also reports that she is still having pain in her left leg. Patient reports that she has mentioned this before in the past, and it was recommended that she discontinue her cholesterol medication. She reports that this was about 1 month ago. Patient reports that she still has a dull ache in her leg.   Review of Systems  Constitutional: Negative.   HENT: Positive for rhinorrhea, sneezing and tinnitus.   Eyes: Negative.   Respiratory: Negative.   Cardiovascular: Negative.   Gastrointestinal: Negative.   Endocrine: Negative.   Genitourinary: Negative.   Musculoskeletal: Positive for arthralgias and myalgias.  Skin: Negative.   Allergic/Immunologic: Negative.   Neurological: Negative.   Hematological: Negative.   Psychiatric/Behavioral: Negative.     Social History   Social History  . Marital status: Married    Spouse name: N/A  . Number of children: N/A  . Years of education: N/A   Occupational History  . Not on file.   Social History Main Topics  . Smoking status: Never Smoker  . Smokeless tobacco: Never Used  . Alcohol use No  . Drug use: No  . Sexual activity: No   Other Topics Concern  . Not on file   Social History Narrative  . No narrative on file    No past medical history on file.   Patient Active Problem  List   Diagnosis Date Noted  . Anxiety 03/23/2015  . Arthritis 03/23/2015  . Cardiovascular disease 03/23/2015  . Urinary system disease 03/23/2015  . Malignant neoplasm of corpus uteri, except isthmus ([place]) 03/23/2015  . Essential (primary) hypertension 03/23/2015  . Acid reflux 03/23/2015  . Cardiac murmur 03/23/2015  . Hypercholesteremia 03/23/2015  . Adult hypothyroidism 03/23/2015  . Cannot sleep 03/23/2015  . Malaise and fatigue 03/23/2015  . Muscle ache 03/23/2015  . Arthritis, degenerative 03/23/2015  . Diabetes mellitus, type 2 ([place]) 03/23/2015    Past Surgical History:  Procedure Laterality Date  . ABDOMINAL HYSTERECTOMY    . APPENDECTOMY    . BACK SURGERY     three  . CATARACT EXTRACTION    . [place]      Her family history includes Cancer in her brother and brother; Diabetes in her father; Heart disease in her mother and sister; Pneumonia in her father; Stroke in her brother, father, and sister; Sudden death in her brother, sister, and sister.    Current Meds  Medication Sig  . ALPRAZolam (XANAX) 0.5 MG tablet Take 1 tablet (0.5 mg total) by mouth at bedtime as needed.  Marland Kitchen amLODipine (NORVASC) 5 MG tablet Take 1 tablet (5 mg total) by mouth daily.  Marland Kitchen glucose blood (ONE TOUCH ULTRA TEST) test strip Check sugar  twice daily.  . Insulin Glargine (LANTUS) 100 UNIT/ML Solostar Pen Inject 28 Units into the skin at bedtime. (Patient taking differently: Inject 30 Units into the skin at bedtime. )  . Insulin Pen Needle 31G X 8 MM MISC Check sugar 2 times daily, DX E11.9 (needs ultra fine pen needles)  . omeprazole (PRILOSEC) 20 MG capsule Take 1 capsule by mouth daily.  . quinapril-hydrochlorothiazide (ACCURETIC) 20-12.5 MG tablet Take 1 tablet by mouth 2 (two) times daily.    Patient Care Team: Jerrol Banana., MD as PCP - General (Family Medicine)    Objective:   Vitals: BP 128/62 (BP Location: Right  Arm, Patient Position: Sitting, Cuff Size: Normal)   Pulse 72   Temp 98.2 F (36.8 C)   Resp 16   Wt 169 lb (76.7 kg)   BMI 29.70 kg/m   Physical Exam  Constitutional: She is oriented to person, place, and time. She appears well-developed and well-nourished.  HENT:  Head: Normocephalic and atraumatic.  Right Ear: External ear normal.  Left Ear: External ear normal.  Nose: Nose normal.  Mouth/Throat: Oropharynx is clear and moist.  Eyes: Conjunctivae are normal. No scleral icterus.  Neck: No thyromegaly present.  Cardiovascular: Normal rate, regular rhythm and normal heart sounds.   3/6 systolic murmur that radiates into both carotids  Pulmonary/Chest: Effort normal and breath sounds normal.  Abdominal: Soft.  Musculoskeletal: She exhibits tenderness.  Mildly tender on the left upper medial calf and behind the left knee in the popliteal fossa.  Lymphadenopathy:    She has no cervical adenopathy.  Neurological: She is alert and oriented to person, place, and time.  Skin: Skin is warm and dry.  Psychiatric: She has a normal mood and affect. Her behavior is normal. Judgment and thought content normal.    Activities of Daily Living In your present state of health, do you have any difficulty performing the following activities: 09/06/2016 01/09/2016  Hearing? N N  Vision? N N  Difficulty concentrating or making decisions? Y N  Walking or climbing stairs? N Y  Dressing or bathing? N N  Doing errands, shopping? N N  Preparing Food and eating ? N -  Using the Toilet? N -  In the past six months, have you accidently leaked urine? N -  Do you have problems with loss of bowel control? N -  Managing your Medications? N -  Managing your Finances? N -  Housekeeping or managing your Housekeeping? N -  Some recent data might be hidden    Fall Risk Assessment Fall Risk  09/06/2016 01/09/2016  Falls in the past year? No No     Depression Screen PHQ 2/9 Scores 09/06/2016 01/09/2016    PHQ - 2 Score 0 0         Assessment & Plan:   Subsequent annual wellness   1. Hypercholesteremia  - Lipid panel  2. Type 2 diabetes mellitus with complication, unspecified long term insulin use status (HCC)  - Hemoglobin A1c  3. Osteoarthritis of left knee, unspecified osteoarthritis type  - Ambulatory referral to [place], MD  [facility]

## 2016-09-13 DIAGNOSIS — E118 Type 2 diabetes mellitus with unspecified complications: Secondary | ICD-10-CM | POA: Diagnosis not present

## 2016-09-13 DIAGNOSIS — E7800 Pure hypercholesterolemia, unspecified: Secondary | ICD-10-CM | POA: Diagnosis not present

## 2016-09-14 LAB — LIPID PANEL
CHOLESTEROL TOTAL: 258 mg/dL — AB (ref 100–199)
Chol/HDL Ratio: 5 ratio units — ABNORMAL HIGH (ref 0.0–4.4)
HDL: 52 mg/dL (ref >39)
LDL Calculated: 160 mg/dL — ABNORMAL HIGH (ref 0–99)
TRIGLYCERIDES: 230 mg/dL — AB (ref 0–149)
VLDL Cholesterol Cal: 46 mg/dL — ABNORMAL HIGH (ref 5–40)

## 2016-09-14 LAB — HEMOGLOBIN A1C
Est. average glucose Bld gHb Est-mCnc: 177 mg/dL
Hgb A1c MFr Bld: 7.8 % — ABNORMAL HIGH (ref 4.8–5.6)

## 2016-09-16 ENCOUNTER — Telehealth: Payer: Self-pay

## 2016-09-16 ENCOUNTER — Other Ambulatory Visit: Payer: Self-pay | Admitting: Family Medicine

## 2016-09-16 NOTE — Telephone Encounter (Signed)
-----   Message from Jerrol Banana., MD sent at 09/15/2016  6:07 AM EDT ----- Stable for now. His patient intolerant of statin? I think I remember she is.

## 2016-09-16 NOTE — Telephone Encounter (Signed)
Patient advised as below. Patient reports she is intolerant of statins.

## 2016-09-16 NOTE — Telephone Encounter (Signed)
thanks

## 2016-09-17 ENCOUNTER — Other Ambulatory Visit: Payer: Self-pay | Admitting: Family Medicine

## 2016-09-30 DIAGNOSIS — M5416 Radiculopathy, lumbar region: Secondary | ICD-10-CM | POA: Diagnosis not present

## 2016-09-30 DIAGNOSIS — M79605 Pain in left leg: Secondary | ICD-10-CM | POA: Diagnosis not present

## 2016-09-30 DIAGNOSIS — M1712 Unilateral primary osteoarthritis, left knee: Secondary | ICD-10-CM | POA: Diagnosis not present

## 2016-10-02 ENCOUNTER — Other Ambulatory Visit: Payer: Self-pay | Admitting: Orthopedic Surgery

## 2016-10-02 ENCOUNTER — Ambulatory Visit
Admission: RE | Admit: 2016-10-02 | Discharge: 2016-10-02 | Disposition: A | Discharge status: Home / Self Care | Payer: PPO | Source: Ambulatory Visit | Attending: Orthopedic Surgery | Admitting: Orthopedic Surgery

## 2016-10-02 ENCOUNTER — Telehealth: Payer: Self-pay | Admitting: Family Medicine

## 2016-10-02 DIAGNOSIS — M5416 Radiculopathy, lumbar region: Secondary | ICD-10-CM

## 2016-10-02 NOTE — Telephone Encounter (Signed)
Do we need to adjust anything or just to follow? What is your advice?-aa

## 2016-10-02 NOTE — Telephone Encounter (Signed)
Just follow as olong as she is feeling OK.

## 2016-10-02 NOTE — Telephone Encounter (Signed)
Patient was advised. KW 

## 2016-10-02 NOTE — Telephone Encounter (Signed)
Pt went to the orthopedic doctor as Dr. Rosanna Randy referred.  The orthopedic Dr. Doristine Section her on prednisone.  This has made her blood sugar go up.  It was 300 this am.  Please advise.  Pt's call back is [phone]  [facility]

## 2016-10-18 DIAGNOSIS — I10 Essential (primary) hypertension: Secondary | ICD-10-CM | POA: Diagnosis not present

## 2016-10-18 DIAGNOSIS — Z6829 Body mass index (BMI) 29.0-29.9, adult: Secondary | ICD-10-CM | POA: Diagnosis not present

## 2016-10-18 DIAGNOSIS — M5416 Radiculopathy, lumbar region: Secondary | ICD-10-CM | POA: Diagnosis not present

## 2016-10-19 ENCOUNTER — Other Ambulatory Visit: Payer: Self-pay | Admitting: Family Medicine

## 2016-10-19 DIAGNOSIS — F419 Anxiety disorder, unspecified: Secondary | ICD-10-CM

## 2016-10-23 ENCOUNTER — Telehealth: Payer: Self-pay

## 2016-10-23 NOTE — Telephone Encounter (Signed)
[id number] 

## 2016-10-24 NOTE — Telephone Encounter (Signed)
[id number] 

## 2016-10-24 NOTE — Telephone Encounter (Signed)
Spoke with patient about the form that we received from a supply company about wrist orthosis and patient states she did not request this and that she has seen neurosurgeon and orthopedic about back pain not wrist pain. Order form faxed back to the company, possible scam and patient is not interested-aa

## 2016-10-24 NOTE — Telephone Encounter (Signed)
Pt states she is returning a call.  JF#[phone]/[id number]

## 2016-10-29 DIAGNOSIS — E1142 Type 2 diabetes mellitus with diabetic polyneuropathy: Secondary | ICD-10-CM | POA: Diagnosis not present

## 2016-10-29 DIAGNOSIS — L851 Acquired keratosis [keratoderma] palmaris et plantaris: Secondary | ICD-10-CM | POA: Diagnosis not present

## 2016-11-28 ENCOUNTER — Encounter: Payer: Self-pay | Admitting: Family Medicine

## 2016-11-28 ENCOUNTER — Ambulatory Visit (INDEPENDENT_AMBULATORY_CARE_PROVIDER_SITE_OTHER): Payer: PPO | Admitting: Family Medicine

## 2016-11-28 VITALS — BP 108/62 | HR 80 | Temp 98.0°F | Resp 16 | Wt 171.0 lb

## 2016-11-28 DIAGNOSIS — E118 Type 2 diabetes mellitus with unspecified complications: Secondary | ICD-10-CM | POA: Diagnosis not present

## 2016-11-28 DIAGNOSIS — I10 Essential (primary) hypertension: Secondary | ICD-10-CM | POA: Diagnosis not present

## 2016-11-28 NOTE — Progress Notes (Signed)
Patient: Jasmine Webster Female    DOB: [birth year]   83 y.o.   MRN: [id number] Visit Date: 11/28/2016  Today's Provider: [name], MD   Chief Complaint  Patient presents with  . Diabetes  . Hypertension   Subjective:    HPI      Diabetes Mellitus Type II, Follow-up:   Lab Results  Component Value Date   HGBA1C 7.8 (H) 09/13/2016   HGBA1C 7.5 05/08/2016   HGBA1C 7.4 01/09/2016    Last seen for diabetes 2 months ago.  Management since then includes no changes. She reports excellent compliance with treatment. She is not having side effects.  Current symptoms include none and have been stable. Home blood sugar records: fasting range: have been increasing over the last 2 mornings. Over 200. Pt is questioning if her new medication, Gabapentin, could be causing the increase  Episodes of hypoglycemia? yes - rarely   Current Insulin Regimen: 30 units daily Most Recent Eye Exam: has one in May at [facility] Weight trend: increasing steadily Prior visit with dietician: yes  Current diet: in general, a "healthy" diet   Current exercise: none due to back pain  Pertinent Labs:    Component Value Date/Time   CHOL 258 (H) 09/13/2016 0923   TRIG 230 (H) 09/13/2016 0923   HDL 52 09/13/2016 0923   LDLCALC 160 (H) 09/13/2016 0923   CREATININE 1.21 (H) 06/20/2016 1407    Wt Readings from Last 3 Encounters:  11/28/16 171 lb (77.6 kg)  09/11/16 169 lb (76.7 kg)  09/06/16 168 lb 8 oz (76.4 kg)    ------------------------------------------------------------------------  Hypertension, follow-up:  BP Readings from Last 3 Encounters:  11/28/16 108/62  09/11/16 128/62  09/06/16 (!) 137/59    She was last seen for hypertension 2 months ago.  BP at that visit was 128/62. Management since that visit includes none. She reports excellent compliance with treatment. She is not having side effects.  She is not exercising. She is adherent to low salt diet.     Outside blood pressures are not being checked. She is experiencing none.  Patient denies chest pain, chest pressure/discomfort, dyspnea, exertional chest pressure/discomfort, fatigue, irregular heart beat, lower extremity edema, near-syncope, orthopnea, palpitations and syncope.   Cardiovascular risk factors include advanced age (older than 11 for men, 70 for women), diabetes mellitus, dyslipidemia and hypertension.    Weight trend: increasing steadily Wt Readings from Last 3 Encounters:  11/28/16 171 lb (77.6 kg)  09/11/16 169 lb (76.7 kg)  09/06/16 168 lb 8 oz (76.4 kg)    Current diet: in general, a "healthy" diet    ------------------------------------------------------------------------   Allergies  Allergen Reactions  . Lamisil  [Terbinafine Hcl]   . Latex   . Lodine  [Etodolac]   . Naproxen   . Statins Other (See Comments)    No energy and myalgias     Current Outpatient Prescriptions:  .  ALPRAZolam (XANAX) 0.5 MG tablet, TAKE ONE TABLET BY MOUTH TWICE DAILY AS NEEDED, Disp: 180 tablet, Rfl: 0 .  amLODipine (NORVASC) 5 MG tablet, Take 1 tablet (5 mg total) by mouth daily., Disp: 90 tablet, Rfl: 3 .  aspirin 81 MG tablet, Take 81 mg by mouth daily., Disp: , Rfl:  .  gabapentin (NEURONTIN) 100 MG capsule, , Disp: , Rfl:  .  glucose blood (ONE TOUCH ULTRA TEST) test strip, Check sugar twice daily., Disp: 100 each, Rfl: 3 .  Insulin Pen Needle  31G X 8 MM MISC, Check sugar 2 times daily, DX E11.9 (needs ultra fine pen needles), Disp: 90 each, Rfl: 12 .  LANTUS SOLOSTAR 100 UNIT/ML Solostar Pen, INJECT 28 UNITS SUBCUTANEOUSLY AT BEDTIME, Disp: 3 pen, Rfl: 3 .  Multiple Vitamins-Minerals (PRESERVISION AREDS) CAPS, Take by mouth., Disp: , Rfl:  .  omeprazole (PRILOSEC) 20 MG capsule, Take 1 capsule by mouth daily., Disp: , Rfl:  .  quinapril-hydrochlorothiazide (ACCURETIC) 20-12.5 MG tablet, Take 1 tablet by mouth 2 (two) times daily., Disp: 180 tablet, Rfl: 3  Review of  Systems  Constitutional: Negative for activity change, appetite change, chills, diaphoresis, fatigue, fever and unexpected weight change.  Respiratory: Negative for cough and shortness of breath.   Cardiovascular: Negative for chest pain, palpitations and leg swelling.  Endocrine: Negative for polydipsia, polyphagia and polyuria.  Musculoskeletal: Positive for back pain.  Allergic/Immunologic: Negative.   Neurological: Negative.   Hematological: Negative.   Psychiatric/Behavioral: Negative.     Social History  Substance Use Topics  . Smoking status: Never Smoker  . Smokeless tobacco: Never Used  . Alcohol use No   Objective:   BP 108/62 (BP Location: Left Arm, Patient Position: Sitting, Cuff Size: Normal)   Pulse 80   Temp 98 F (36.7 C) (Oral)   Resp 16   Wt 171 lb (77.6 kg)   BMI 30.05 kg/m   Physical Exam  Constitutional: She is oriented to person, place, and time. She appears well-developed and well-nourished.  HENT:  Head: Normocephalic and atraumatic.  Eyes: Conjunctivae are normal. No scleral icterus.  Neck: No thyromegaly present.  Cardiovascular: Normal rate and regular rhythm.   Murmur heard.  Systolic murmur is present with a grade of 2/6  No carotid bruit  Pulmonary/Chest: Effort normal and breath sounds normal. No respiratory distress.  Abdominal: Soft.  Musculoskeletal: She exhibits no edema.  Neurological: She is alert and oriented to person, place, and time.  Skin: Skin is warm and dry.  Psychiatric: She has a normal mood and affect. Her behavior is normal.        Assessment & Plan:     1. Type 2 diabetes mellitus with complication, unspecified long term insulin use status ([place]) Too soon today to check A1C. Pt reports she has noticed higher BS, and has gained weight. Will recheck in 2 months. Call for problems. 2. Essential (primary) hypertension Stable. Continue current medications. 3.HLD 4.OA 5.DDD    Patient seen and examined by Miguel Aschoff, MD, and note scribed by Renaldo Fiddler, CMA. I have done the exam and reviewed the chart and it is accurate to the best of my knowledge. Development worker, community has been used and  any errors in dictation or transcription are unintentional. Miguel Aschoff M.D. [place], MD  [facility]

## 2016-11-29 ENCOUNTER — Other Ambulatory Visit: Payer: Self-pay | Admitting: Family Medicine

## 2016-11-29 DIAGNOSIS — F419 Anxiety disorder, unspecified: Secondary | ICD-10-CM

## 2016-12-02 ENCOUNTER — Other Ambulatory Visit: Payer: Self-pay | Admitting: Family Medicine

## 2016-12-02 DIAGNOSIS — F419 Anxiety disorder, unspecified: Secondary | ICD-10-CM

## 2016-12-06 ENCOUNTER — Telehealth: Payer: Self-pay | Admitting: Family Medicine

## 2016-12-06 NOTE — Telephone Encounter (Signed)
Pt needs refill on her   ALPRAZolam (XANAX) 0.5 MG tablet  [facility] uses [facility] [address].    Thank sTeri

## 2016-12-06 NOTE — Telephone Encounter (Signed)
RX called in-aa 

## 2016-12-16 ENCOUNTER — Ambulatory Visit: Payer: PPO | Admitting: Family Medicine

## 2016-12-16 ENCOUNTER — Other Ambulatory Visit: Payer: Self-pay | Admitting: Neurosurgery

## 2016-12-16 DIAGNOSIS — M5416 Radiculopathy, lumbar region: Secondary | ICD-10-CM

## 2016-12-18 ENCOUNTER — Ambulatory Visit
Admission: RE | Admit: 2016-12-18 | Discharge: 2016-12-18 | Disposition: A | Discharge status: Home / Self Care | Payer: PPO | Source: Ambulatory Visit | Attending: Neurosurgery | Admitting: Neurosurgery

## 2016-12-18 DIAGNOSIS — M549 Dorsalgia, unspecified: Secondary | ICD-10-CM | POA: Diagnosis not present

## 2016-12-18 DIAGNOSIS — M5416 Radiculopathy, lumbar region: Secondary | ICD-10-CM

## 2016-12-18 MED ORDER — ONDANSETRON HCL 4 MG/2ML IJ SOLN: 4.0000 mg | Freq: Four times a day (QID) | INTRAMUSCULAR | Status: DC | PRN

## 2016-12-18 MED ORDER — IOPAMIDOL (ISOVUE-M 200) INJECTION 41%
20.0000 mL | Freq: Once | INTRAMUSCULAR | Status: AC
  Administered 2016-12-18: 20 mL via INTRATHECAL

## 2016-12-18 MED ORDER — DIAZEPAM 5 MG PO TABS
5.0000 mg | ORAL_TABLET | Freq: Once | ORAL | Status: AC
  Administered 2016-12-18: 5 mg via ORAL

## 2016-12-18 NOTE — Discharge Instructions (Signed)

## 2016-12-31 ENCOUNTER — Telehealth: Payer: Self-pay | Admitting: Family Medicine

## 2016-12-31 NOTE — Telephone Encounter (Signed)
Pt called wanting to know if she could get something for her back pain.  She was told it was a pinched nerve.    [facility]  [address]  Her call back is [phone]  Thanks Con Memos

## 2016-12-31 NOTE — Telephone Encounter (Signed)
Please review chart and advise. KW 

## 2017-01-01 MED ORDER — LIDOCAINE 5 % EX PTCH: 1.0000 | MEDICATED_PATCH | CUTANEOUS | 11 refills | Status: DC

## 2017-01-01 NOTE — Telephone Encounter (Signed)
Is there a 5%? Or is it  0.5%

## 2017-01-01 NOTE — Telephone Encounter (Signed)
lidoderm patch 5% was sent into the pharmacy. Patient advised.

## 2017-01-01 NOTE — Telephone Encounter (Signed)
Safest is Lidoderm patch every 12 hrs. Box of [id number].

## 2017-01-01 NOTE — Telephone Encounter (Signed)
Dr. Darnell Level, which % of the lidoderm patch would you like? There are several. Sorry just wanted to make sure. Thanks!

## 2017-01-06 ENCOUNTER — Ambulatory Visit: Payer: PPO | Attending: Anesthesiology | Admitting: Anesthesiology

## 2017-01-06 ENCOUNTER — Encounter: Payer: Self-pay | Admitting: Anesthesiology

## 2017-01-06 VITALS — BP 118/57 | HR 77 | Temp 98.1°F | Resp 16 | Ht 64.0 in | Wt 165.0 lb

## 2017-01-06 DIAGNOSIS — M4697 Unspecified inflammatory spondylopathy, lumbosacral region: Secondary | ICD-10-CM | POA: Diagnosis not present

## 2017-01-06 DIAGNOSIS — M5137 Other intervertebral disc degeneration, lumbosacral region: Secondary | ICD-10-CM | POA: Diagnosis not present

## 2017-01-06 DIAGNOSIS — Z833 Family history of diabetes mellitus: Secondary | ICD-10-CM | POA: Diagnosis not present

## 2017-01-06 DIAGNOSIS — Z823 Family history of stroke: Secondary | ICD-10-CM | POA: Insufficient documentation

## 2017-01-06 DIAGNOSIS — Z7982 Long term (current) use of aspirin: Secondary | ICD-10-CM | POA: Insufficient documentation

## 2017-01-06 DIAGNOSIS — M545 Low back pain, unspecified: Secondary | ICD-10-CM

## 2017-01-06 DIAGNOSIS — M5432 Sciatica, left side: Secondary | ICD-10-CM | POA: Diagnosis not present

## 2017-01-06 DIAGNOSIS — E119 Type 2 diabetes mellitus without complications: Secondary | ICD-10-CM | POA: Diagnosis not present

## 2017-01-06 DIAGNOSIS — Z794 Long term (current) use of insulin: Secondary | ICD-10-CM | POA: Insufficient documentation

## 2017-01-06 DIAGNOSIS — Z9849 Cataract extraction status, unspecified eye: Secondary | ICD-10-CM | POA: Insufficient documentation

## 2017-01-06 DIAGNOSIS — Z8249 Family history of ischemic heart disease and other diseases of the circulatory system: Secondary | ICD-10-CM | POA: Insufficient documentation

## 2017-01-06 DIAGNOSIS — Z809 Family history of malignant neoplasm, unspecified: Secondary | ICD-10-CM | POA: Insufficient documentation

## 2017-01-06 DIAGNOSIS — M5442 Lumbago with sciatica, left side: Secondary | ICD-10-CM | POA: Insufficient documentation

## 2017-01-06 DIAGNOSIS — M25552 Pain in left hip: Secondary | ICD-10-CM | POA: Diagnosis not present

## 2017-01-06 DIAGNOSIS — M47817 Spondylosis without myelopathy or radiculopathy, lumbosacral region: Secondary | ICD-10-CM

## 2017-01-06 DIAGNOSIS — Z981 Arthrodesis status: Secondary | ICD-10-CM | POA: Insufficient documentation

## 2017-01-06 DIAGNOSIS — M961 Postlaminectomy syndrome, not elsewhere classified: Secondary | ICD-10-CM | POA: Diagnosis not present

## 2017-01-06 DIAGNOSIS — Z90710 Acquired absence of both cervix and uterus: Secondary | ICD-10-CM | POA: Insufficient documentation

## 2017-01-06 MED ORDER — HYDROCODONE-ACETAMINOPHEN 5-325 MG PO TABS: 1.0000 | ORAL_TABLET | Freq: Two times a day (BID) | ORAL | 0 refills | Status: AC

## 2017-01-06 NOTE — Progress Notes (Signed)
Subjective:  Patient ID: Jasmine Webster, female    DOB: [birth year]  Age: 83 y.o. MRN: [id number]  CC: Hip Pain (left buttocks "underneath")     PROCEDURE:None  HPI ELAF CLAUSON presents for a new patient evaluation. She is a pleasant 83 year old white female with a long-standing history of low back pain. She describes having had multiple procedures on her low back and neck and has undergone a low thoracic to lower lumbar spinal fusion. She was recently seen by Dr. Earle Gell and subsequently referred to [name] for her sciatica. She experiences this primarily in the left lower back with radiation into the left posterior leg and describes it as a maximum pain score of 6 that does not appear to be influenced by time of day but is worse with prolonged sitting. Alleviating factors include lying down and particular positions to decompress her back seems to help. She does not have any appreciable lower extremity weakness. She occasionally gets some cramping type pain especially in the left lower back with radiation into the buttocks and hip that is burning and constant. She cannot get any relief at this time and has been on medication management and has gone through conservative therapy without significant improvement. She also has had a previous CT myelogram and I reviewed the results of this today.  History Jadan has a past medical history of Arthritis; Cataract; and Diabetes mellitus without complication ([place]).   She has a past surgical history that includes Back surgery; Appendectomy; Abdominal hysterectomy; Tonsillectomy and adenoidectomy; Cataract extraction; and Thoracolumbar syringo shunt.   Her family history includes Cancer in her brother and brother; Diabetes in her father; Heart disease in her mother and sister; Pneumonia in her father; Stroke in her brother, father, and sister; Sudden death in her brother, sister, and sister.She reports that she has never smoked. She has never used  smokeless tobacco. She reports that she does not drink alcohol or use drugs.  No results found for this or any previous visit.  No results found for: TOXASSSELUR  Outpatient Medications Prior to Visit  Medication Sig Dispense Refill  . ALPRAZolam (XANAX) 0.5 MG tablet TAKE ONE TABLET (0.5 MG TOTAL) BY MOUTH AT BEDTIME AS NEEDED 30 tablet 5  . amLODipine (NORVASC) 5 MG tablet Take 1 tablet (5 mg total) by mouth daily. 90 tablet 3  . aspirin 81 MG tablet Take 81 mg by mouth daily.    Marland Kitchen gabapentin (NEURONTIN) 100 MG capsule 200 mg 2 (two) times daily.     Marland Kitchen glucose blood (ONE TOUCH ULTRA TEST) test strip Check sugar twice daily. 100 each 3  . Insulin Pen Needle 31G X 8 MM MISC Check sugar 2 times daily, DX E11.9 (needs ultra fine pen needles) 90 each 12  . LANTUS SOLOSTAR 100 UNIT/ML Solostar Pen INJECT 28 UNITS SUBCUTANEOUSLY AT BEDTIME 3 pen 3  . Multiple Vitamins-Minerals (PRESERVISION AREDS) CAPS Take 2 capsules by mouth daily.     . Omega-3 Fatty Acids (FISH OIL) 1000 MG CAPS Take 1,000 mg by mouth daily.     Marland Kitchen omeprazole (PRILOSEC) 20 MG capsule Take 1 capsule by mouth as needed.     . quinapril-hydrochlorothiazide (ACCURETIC) 20-12.5 MG tablet Take 1 tablet by mouth 2 (two) times daily. 180 tablet 3  . lidocaine (LIDODERM) 5 % Place 1 patch onto the skin daily. Remove & Discard patch within 12 hours or as directed by MD (Patient not taking: Reported on 01/06/2017) 60 patch 11   No  facility-administered medications prior to visit.    Lab Results  Component Value Date   WBC 6.5 06/20/2016   HGB 12.3 12/27/2013   HCT 37.9 06/20/2016   PLT CANCELED 06/20/2016   GLUCOSE 278 (H) 06/20/2016   CHOL 258 (H) 09/13/2016   TRIG 230 (H) 09/13/2016   HDL 52 09/13/2016   LDLCALC 160 (H) 09/13/2016   ALT 24 06/20/2016   AST 30 06/20/2016   NA 140 06/20/2016   K 4.6 06/20/2016   CL 100 06/20/2016   CREATININE 1.21 (H) 06/20/2016   BUN 31 (H) 06/20/2016   CO2 21 06/20/2016   TSH 2.270  06/20/2016   HGBA1C 7.8 (H) 09/13/2016    --------------------------------------------------------------------------------------------------------------------- Ct Lumbar Spine W Contrast  Result Date: 12/18/2016 CLINICAL DATA:  Back pain EXAM: CT MYELOGRAPHY LUMBAR SPINE TECHNIQUE: CT imaging of the lumbar spine was performed after intrathecal contrast administration. Multiplanar CT image reconstructions were also generated. COMPARISON:  None. FINDINGS: There are bilateral pedicle screws in T12 through S1. There are cross stabilizing bars extending from the thoracic spine to the L4 pedicle screws. There are cross stabilizing bars extending from the L5-S1 pedicle screws. There are disc spacers present at every level except for L1-2 in above. There is solid fusion in the posterior elements from T11 through S1. The study extends from the mid T11 level. There is also solid fusion across the L2-3, L3-4, and L5-S1 discs. Fusion across the L4-5 disc has not yet occurred. Segmentation:  5 lumbar type vertebral bodies. Alignment: Normal. Vertebrae: There is no vertebral compression deformity. Conus medullaris: Extends to the L1-2 disc level and appears normal. Paraspinal and other soft tissues: Aortic atherosclerotic calcification. Disc levels: L1-2: Central canal is widely patent. L2-3:  Central canal is widely patent.  Posterior decompression. L3-4:  Central canal is widely patent.  Posterior decompression. L4-5: Posterior decompression. Central canal is widely patent. Disc osteophytes to encroach upon the foramina bilaterally. This results in mild foraminal narrowing. L5-S1: Central canal is widely patent. Disc osteophytes mildly encroach upon the foramina bilaterally. No obvious narrowing. Posterior decompression. IMPRESSION: Thoracolumbar fusion is solid as described above. This includes solid fusion across the posterior elements at L4-5 without solid fusion across the disc space. I suspect that the apparent  abnormal motion noted on plain radiographs was artifactual and fusion is solid and intact. There is mild foraminal narrowing at L4-5 secondary to disc osteophytes. Electronically Signed   By: Marybelle Killings M.D.   On: 12/18/2016 15:15   Dg Myelography Lumbar Inj Lumbosacral  Result Date: 12/18/2016 CLINICAL DATA:  Back pain.  Lumbar fusion. EXAM: LUMBAR MYELOGRAM FLUOROSCOPY TIME:  37 seconds.  15.6 mGy. PROCEDURE: After thorough discussion of risks and benefits of the procedure including bleeding, infection, injury to nerves, blood vessels, adjacent structures as well as headache and CSF leak, written and oral informed consent was obtained. Consent was obtained by Dr. Marybelle Killings. Time out form was completed. Patient was positioned prone on the fluoroscopy table. Local anesthesia was provided with 1% lidocaine without epinephrine after prepped and draped in the usual sterile fashion. Puncture was performed at L3-4 using a 3 1/2 inch 22-gauge spinal needle via a midline approach. Using a single pass through the dura, the needle was placed within the thecal sac, with return of clear CSF. 15 mL of Omnipaque-180 was injected into the thecal sac, with normal opacification of the nerve roots and cauda equina consistent with free flow within the subarachnoid space. I personally performed the lumbar  puncture and administered the intrathecal contrast. I also personally supervised acquisition of the myelogram images. TECHNIQUE: Contiguous axial images were obtained through the Lumbar spine after the intrathecal infusion of infusion. Coronal and sagittal reconstructions were obtained of the axial image sets. COMPARISON:  None FINDINGS: There are bilateral pedicle screw from L1 through S1 as well as in the lower thoracic spine. Cross stabilizing bars extend from the thoracic spine to the L4 pedicle screws. There are also L5-S1 pedicle screws with cross stabilizing bars. There is no breakage or loosening of the hardware.  There is anatomic alignment of the vertebral bodies. There is no vertebral compression deformity. No obvious spinal stenosis is present. A disc spacer is present at L4-5. There is some motion in the disc between flexion and extension views. IMPRESSION: Extensive thoracolumbosacral fusion as described. There is noted to be some degree of motion at the L4-5 level. A disc spacer is present at this level but there is no posterior stabilization. Electronically Signed   By: Marybelle Killings M.D.   On: 12/18/2016 14:38       ---------------------------------------------------------------------------------------------------------------------- Past Medical History:  Diagnosis Date  . Arthritis   . Cataract    cataract removal bilaterally approx 20 years ago  . Diabetes mellitus without complication [facility])     Past Surgical History:  Procedure Laterality Date  . ABDOMINAL HYSTERECTOMY    . APPENDECTOMY    . BACK SURGERY     three  . CATARACT EXTRACTION    . THORACOLUMBAR SYRINGO SHUNT    . TONSILLECTOMY AND ADENOIDECTOMY      Family History  Problem Relation Age of Onset  . Heart disease Mother     died from MI  . Heart disease Sister   . Sudden death Brother     shot and beaten to death during home invasion  . Stroke Sister   . Sudden death Sister     MVA  . Cancer Brother     died from lung cancer  . Sudden death Sister     40 days old  . Cancer Brother     died from lung cancer  . Stroke Brother     cause of death  . Diabetes Father   . Stroke Father   . Pneumonia Father     Social History  Substance Use Topics  . Smoking status: Never Smoker  . Smokeless tobacco: Never Used  . Alcohol use No    ---------------------------------------------------------------------------------------------------------------------  Scheduled Meds: Continuous Infusions: PRN Meds:.   BP (!) 118/57 (BP Location: Left Arm, Patient Position: Sitting, Cuff Size: Normal)   Pulse 77   Temp  98.1 F (36.7 C) (Oral)   Resp 16   Ht 5\' 4"  (1.626 m)   Wt 165 lb (74.8 kg)   SpO2 97%   BMI 28.32 kg/m    BP Readings from Last 3 Encounters:  01/06/17 (!) 118/57  12/18/16 (!) 127/53  11/28/16 108/62     Wt Readings from Last 3 Encounters:  01/06/17 165 lb (74.8 kg)  11/28/16 171 lb (77.6 kg)  09/11/16 169 lb (76.7 kg)     ----------------------------------------------------------------------------------------------------------------------  ROS Review of Systems  Cardiac: No angina takes a daily aspirin with a history of a murmur Pulmonary [place] no shortness of breath GI: History of ulcers but no hematemesis or hematochezia Endocrine: Negative  The remainder of review of systems was negative  Objective:  BP (!) 118/57 (BP Location: Left Arm, Patient Position: Sitting, Cuff Size: Normal)  Pulse 77   Temp 98.1 F (36.7 C) (Oral)   Resp 16   Ht 5\' 4"  (1.626 m)   Wt 165 lb (74.8 kg)   SpO2 97%   BMI 28.32 kg/m   Physical Exam Pupils are equally round reactive to light extraocular muscles are intact and the patient is a good historian Heart reveals a 2/6 systolic ejection murmur with regular rhythm and rate Lungs are clear to auscultation without wheeze She is a well-healed midline scar in the lumbar region. She has limited range of motion upon extension or flexion. No significant paraspinous muscle tenderness is noted. Strength in the lower extremities appears to be intact with good muscle tone and bulk     Assessment & Plan:   Saveah was seen today for hip pain.  Diagnoses and all orders for this visit:  Failed back surgical syndrome -     Lumbar Epidural Injection; Future  Facet arthritis of lumbosacral region [facility]) -     Lumbar Epidural Injection; Future  Sciatica of left side -     Lumbar Epidural Injection; Future  Low back pain at multiple sites  Other orders -     HYDROcodone-acetaminophen (NORCO/VICODIN) 5-325 MG tablet; Take 1 tablet  by mouth 2 (two) times daily.     ----------------------------------------------------------------------------------------------------------------------  Problem List Items Addressed This Visit    None    Visit Diagnoses    Failed back surgical syndrome    -  Primary   Relevant Medications   HYDROcodone-acetaminophen (NORCO/VICODIN) 5-325 MG tablet   Other Relevant Orders   Lumbar Epidural Injection   Facet arthritis of lumbosacral region (HCC)       Relevant Medications   HYDROcodone-acetaminophen (NORCO/VICODIN) 5-325 MG tablet   Other Relevant Orders   Lumbar Epidural Injection   Sciatica of left side       Relevant Orders   Lumbar Epidural Injection   Low back pain at multiple sites       Relevant Medications   HYDROcodone-acetaminophen (NORCO/VICODIN) 5-325 MG tablet      ----------------------------------------------------------------------------------------------------------------------  1. Failed back surgical syndrome Based on her findings today we will plan on a caudal epidural steroid at the next available date. I've gone over the risks and benefits of the procedure with her in detail. - Lumbar Epidural Injection; Future  2. Facet arthritis of lumbosacral region [facility]) Ultimately she may be a candidate for diagnostic facet block for her left-sided low back pain  - Lumbar Epidural Injection; Future  3. Sciatica of left side As above - Lumbar Epidural Injection; Future  4. Low back pain at multiple sites We will also give her prescription for Vicodin 5/325 one by mouth twice a day to see if this can help with her recalcitrant pain. She is use this in the past and this has been beneficial. Furthermore she is to continue with her current medication management including her Neurontin. She is to return to clinic at the next available date for her first caudal  epidural.    ----------------------------------------------------------------------------------------------------------------------  I am having Ms. Ogletree start on HYDROcodone-acetaminophen. I am also having her maintain her omeprazole, glucose blood, Insulin Pen Needle, quinapril-hydrochlorothiazide, amLODipine, LANTUS SOLOSTAR, gabapentin, PRESERVISION AREDS, aspirin, ALPRAZolam, Fish Oil, and lidocaine.   Meds ordered this encounter  Medications  . HYDROcodone-acetaminophen (NORCO/VICODIN) 5-325 MG tablet    Sig: Take 1 tablet by mouth 2 (two) times daily.    Dispense:  60 tablet    Refill:  0  Follow-up: Return in about 2 weeks (around 01/20/2017) for evaluation, procedure.    Molli Barrows, MD 4:02 PM  The [place] practitioner database for opioid medications on this patient has been reviewed by me and my staff   Greater than 50% of the total encounter time was spent in counseling and / or coordination of care.     This dictation was performed utilizing Systems analyst.  Please excuse any unintentional or mistaken typographical errors as a result.

## 2017-01-06 NOTE — Patient Instructions (Signed)
GENERAL RISKS AND COMPLICATIONS  What are the risk, side effects and possible complications? Generally speaking, most procedures are safe.  However, with any procedure there are risks, side effects, and the possibility of complications.  The risks and complications are dependent upon the sites that are lesioned, or the type of nerve block to be performed.  The closer the procedure is to the spine, the more serious the risks are.  Great care is taken when placing the radio frequency needles, block needles or lesioning probes, but sometimes complications can occur. 1. Infection: Any time there is an injection through the skin, there is a risk of infection.  This is why sterile conditions are used for these blocks.  There are four possible types of infection. 1. Localized skin infection. 2. Central Nervous System Infection-This can be in the form of Meningitis, which can be deadly. 3. Epidural Infections-This can be in the form of an epidural abscess, which can cause pressure inside of the spine, causing compression of the spinal cord with subsequent paralysis. This would require an emergency surgery to decompress, and there are no guarantees that the patient would recover from the paralysis. 4. Discitis-This is an infection of the intervertebral discs.  It occurs in about 1% of discography procedures.  It is difficult to treat and it may lead to surgery.        2. Pain: the needles have to go through skin and soft tissues, will cause soreness.       3. Damage to internal structures:  The nerves to be lesioned may be near blood vessels or    other nerves which can be potentially damaged.       4. Bleeding: Bleeding is more common if the patient is taking blood thinners such as  aspirin, Coumadin, Ticiid, Plavix, etc., or if he/she have some genetic predisposition  such as hemophilia. Bleeding into the spinal canal can cause compression of the spinal  cord with subsequent paralysis.  This would require an  emergency surgery to  decompress and there are no guarantees that the patient would recover from the  paralysis.       5. Pneumothorax:  Puncturing of a lung is a possibility, every time a needle is introduced in  the area of the chest or upper back.  Pneumothorax refers to free air around the  collapsed lung(s), inside of the thoracic cavity (chest cavity).  Another two possible  complications related to a similar event would include: Hemothorax and Chylothorax.   These are variations of the Pneumothorax, where instead of air around the collapsed  lung(s), you may have blood or chyle, respectively.       6. Spinal headaches: They may occur with any procedures in the area of the spine.       7. Persistent CSF (Cerebro-Spinal Fluid) leakage: This is a rare problem, but may occur  with prolonged intrathecal or epidural catheters either due to the formation of a fistulous  track or a dural tear.       8. Nerve damage: By working so close to the spinal cord, there is always a possibility of  nerve damage, which could be as serious as a permanent spinal cord injury with  paralysis.       9. Death:  Although rare, severe deadly allergic reactions known as "Anaphylactic  reaction" can occur to any of the medications used.      10. Worsening of the symptoms:  We can always make thing worse.    What are the chances of something like this happening? Chances of any of this occuring are extremely low.  By statistics, you have more of a chance of getting killed in a motor vehicle accident: while driving to the hospital than any of the above occurring .  Nevertheless, you should be aware that they are possibilities.  In general, it is similar to taking a shower.  Everybody knows that you can slip, hit your head and get killed.  Does that mean that you should not shower again?  Nevertheless always keep in mind that statistics do not mean anything if you happen to be on the wrong side of them.  Even if a procedure has a 1  (one) in a 1,000,000 (million) chance of going wrong, it you happen to be that one..Also, keep in mind that by statistics, you have more of a chance of having something go wrong when taking medications.  Who should not have this procedure? If you are on a blood thinning medication (e.g. Coumadin, Plavix, see list of "Blood Thinners"), or if you have an active infection going on, you should not have the procedure.  If you are taking any blood thinners, please inform your physician.  How should I prepare for this procedure?  Do not eat or drink anything at least six hours prior to the procedure.  Bring a driver with you .  It cannot be a taxi.  Come accompanied by an adult that can drive you back, and that is strong enough to help you if your legs get weak or numb from the local anesthetic.  Take all of your medicines the morning of the procedure with just enough water to swallow them.  If you have diabetes, make sure that you are scheduled to have your procedure done first thing in the morning, whenever possible.  If you have diabetes, take only half of your insulin dose and notify our nurse that you have done so as soon as you arrive at the clinic.  If you are diabetic, but only take blood sugar pills (oral hypoglycemic), then do not take them on the morning of your procedure.  You may take them after you have had the procedure.  Do not take aspirin or any aspirin-containing medications, at least eleven (11) days prior to the procedure.  They may prolong bleeding.  Wear loose fitting clothing that may be easy to take off and that you would not mind if it got stained with Betadine or blood.  Do not wear any jewelry or perfume  Remove any nail coloring.  It will interfere with some of our monitoring equipment.  NOTE: Remember that this is not meant to be interpreted as a complete list of all possible complications.  Unforeseen problems may occur.  BLOOD THINNERS The following drugs  contain aspirin or other products, which can cause increased bleeding during surgery and should not be taken for 2 weeks prior to and 1 week after surgery.  If you should need take something for relief of minor pain, you may take acetaminophen which is found in Tylenol,m Datril, Anacin-3 and Panadol. It is not blood thinner. The products listed below are.  Do not take any of the products listed below in addition to any listed on your instruction sheet.  A.P.C or A.P.C with Codeine Codeine Phosphate Capsules #3 Ibuprofen Ridaura  ABC compound Congesprin Imuran rimadil  Advil Cope Indocin Robaxisal  Alka-Seltzer Effervescent Pain Reliever and Antacid Coricidin or Coricidin-D  Indomethacin Rufen    Alka-Seltzer plus Cold Medicine Cosprin Ketoprofen S-A-C Tablets  Anacin Analgesic Tablets or Capsules Coumadin Korlgesic Salflex  Anacin Extra Strength Analgesic tablets or capsules CP-2 Tablets Lanoril Salicylate  Anaprox Cuprimine Capsules Levenox Salocol  Anexsia-D Dalteparin Magan Salsalate  Anodynos Darvon compound Magnesium Salicylate Sine-off  Ansaid Dasin Capsules Magsal Sodium Salicylate  Anturane Depen Capsules Marnal Soma  APF Arthritis pain formula Dewitt's Pills Measurin Stanback  Argesic Dia-Gesic Meclofenamic Sulfinpyrazone  Arthritis Bayer Timed Release Aspirin Diclofenac Meclomen Sulindac  Arthritis pain formula Anacin Dicumarol Medipren Supac  Analgesic (Safety coated) Arthralgen Diffunasal Mefanamic Suprofen  Arthritis Strength Bufferin Dihydrocodeine Mepro Compound Suprol  Arthropan liquid Dopirydamole Methcarbomol with Aspirin Synalgos  ASA tablets/Enseals Disalcid Micrainin Tagament  Ascriptin Doan's Midol Talwin  Ascriptin A/D Dolene Mobidin Tanderil  Ascriptin Extra Strength Dolobid Moblgesic Ticlid  Ascriptin with Codeine Doloprin or Doloprin with Codeine Momentum Tolectin  Asperbuf Duoprin Mono-gesic Trendar  Aspergum Duradyne Motrin or Motrin IB Triminicin  Aspirin  plain, buffered or enteric coated Durasal Myochrisine Trigesic  Aspirin Suppositories Easprin Nalfon Trillsate  Aspirin with Codeine Ecotrin Regular or Extra Strength Naprosyn Uracel  Atromid-S Efficin Naproxen Ursinus  Auranofin Capsules Elmiron Neocylate Vanquish  Axotal Emagrin Norgesic Verin  Azathioprine Empirin or Empirin with Codeine Normiflo Vitamin E  Azolid Emprazil Nuprin Voltaren  Bayer Aspirin plain, buffered or children's or timed BC Tablets or powders Encaprin Orgaran Warfarin Sodium  Buff-a-Comp Enoxaparin Orudis Zorpin  Buff-a-Comp with Codeine Equegesic Os-Cal-Gesic   Buffaprin Excedrin plain, buffered or Extra Strength Oxalid   Bufferin Arthritis Strength Feldene Oxphenbutazone   Bufferin plain or Extra Strength Feldene Capsules Oxycodone with Aspirin   Bufferin with Codeine Fenoprofen Fenoprofen Pabalate or Pabalate-SF   Buffets II Flogesic Panagesic   Buffinol plain or Extra Strength Florinal or Florinal with Codeine Panwarfarin   Buf-Tabs Flurbiprofen Penicillamine   Butalbital Compound Four-way cold tablets Penicillin   Butazolidin Fragmin Pepto-Bismol   Carbenicillin Geminisyn Percodan   Carna Arthritis Reliever Geopen Persantine   Carprofen Gold's salt Persistin   Chloramphenicol Goody's Phenylbutazone   Chloromycetin Haltrain Piroxlcam   Clmetidine heparin Plaquenil   Cllnoril Hyco-pap Ponstel   Clofibrate Hydroxy chloroquine Propoxyphen         Before stopping any of these medications, be sure to consult the physician who ordered them.  Some, such as Coumadin (Warfarin) are ordered to prevent or treat serious conditions such as "deep thrombosis", "pumonary embolisms", and other heart problems.  The amount of time that you may need off of the medication may also vary with the medication and the reason for which you were taking it.  If you are taking any of these medications, please make sure you notify your pain physician before you undergo any  procedures.         Epidural Steroid Injection Patient Information  Description: The epidural space surrounds the nerves as they exit the spinal cord.  In some patients, the nerves can be compressed and inflamed by a bulging disc or a tight spinal canal (spinal stenosis).  By injecting steroids into the epidural space, we can bring irritated nerves into direct contact with a potentially helpful medication.  These steroids act directly on the irritated nerves and can reduce swelling and inflammation which often leads to decreased pain.  Epidural steroids may be injected anywhere along the spine and from the neck to the low back depending upon the location of your pain.   After numbing the skin with local anesthetic (like Novocaine), a small needle is passed   into the epidural space slowly.  You may experience a sensation of pressure while this is being done.  The entire block usually last less than 10 minutes.  Conditions which may be treated by epidural steroids:   Low back and leg pain  Neck and arm pain  Spinal stenosis  Post-laminectomy syndrome  Herpes zoster (shingles) pain  Pain from compression fractures  Preparation for the injection:  1. Do not eat any solid food or dairy products within 8 hours of your appointment.  2. You may drink clear liquids up to 3 hours before appointment.  Clear liquids include water, black coffee, juice or soda.  No milk or cream please. 3. You may take your regular medication, including pain medications, with a sip of water before your appointment  Diabetics should hold regular insulin (if taken separately) and take 1/2 normal NPH dos the morning of the procedure.  Carry some sugar containing items with you to your appointment. 4. A driver must accompany you and be prepared to drive you home after your procedure.  5. Bring all your current medications with your. 6. An IV may be inserted and sedation may be given at the discretion of the  physician.   7. A blood pressure cuff, EKG and other monitors will often be applied during the procedure.  Some patients may need to have extra oxygen administered for a short period. 8. You will be asked to provide medical information, including your allergies, prior to the procedure.  We must know immediately if you are taking blood thinners (like Coumadin/Warfarin)  Or if you are allergic to IV iodine contrast (dye). We must know if you could possible be pregnant.  Possible side-effects:  Bleeding from needle site  Infection (rare, may require surgery)  Nerve injury (rare)  Numbness & tingling (temporary)  Difficulty urinating (rare, temporary)  Spinal headache ( a headache worse with upright posture)  Light -headedness (temporary)  Pain at injection site (several days)  Decreased blood pressure (temporary)  Weakness in arm/leg (temporary)  Pressure sensation in back/neck (temporary)  Call if you experience:  Fever/chills associated with headache or increased back/neck pain.  Headache worsened by an upright position.  New onset weakness or numbness of an extremity below the injection site  Hives or difficulty breathing (go to the emergency room)  Inflammation or drainage at the infection site  Severe back/neck pain  Any new symptoms which are concerning to you  Please note:  Although the local anesthetic injected can often make your back or neck feel good for several hours after the injection, the pain will likely return.  It takes 3-7 days for steroids to work in the epidural space.  You may not notice any pain relief for at least that one week.  If effective, we will often do a series of three injections spaced 3-6 weeks apart to maximally decrease your pain.  After the initial series, we generally will wait several months before considering a repeat injection of the same type.  If you have any questions, please call [phone] [facility] 

## 2017-01-06 NOTE — Progress Notes (Signed)
Safety precautions to be maintained throughout the outpatient stay will include: orient to surroundings, keep bed in low position, maintain call bell within reach at all times, provide assistance with transfer out of bed and ambulation.  

## 2017-01-09 ENCOUNTER — Other Ambulatory Visit: Payer: Self-pay

## 2017-01-14 ENCOUNTER — Telehealth: Payer: Self-pay

## 2017-01-14 NOTE — Telephone Encounter (Signed)
Spoke with Katherina Right, patient's daughter who accompanied her to her new patient visit with Dr Andree Elk.  Let her know that we do have Mrs Kumari scheduled on 01/20/17 for [facility] pending insurance.  I did tell her that I would touch base with Angie to make sure that we are proceeding with steps to get PA for procedure.  Angie has sent an expedited request to try and meet the patients appt time.

## 2017-01-14 NOTE — Telephone Encounter (Signed)
Pts daughter wants to speak with Cecille Rubin she said it was personal

## 2017-01-15 NOTE — Telephone Encounter (Signed)
Spoke with patient to let her know that her PA was received for the caudal epidural that has been planned and that we could see her on 01/17/17 @ 0915.  Patient agrees that she can come at this time.  We will cancel the appt on 01/20/17.

## 2017-01-17 ENCOUNTER — Ambulatory Visit (HOSPITAL_BASED_OUTPATIENT_CLINIC_OR_DEPARTMENT_OTHER): Payer: PPO | Admitting: Anesthesiology

## 2017-01-17 ENCOUNTER — Ambulatory Visit
Admission: RE | Admit: 2017-01-17 | Discharge: 2017-01-17 | Disposition: A | Discharge status: Home / Self Care | Payer: PPO | Source: Ambulatory Visit | Attending: Anesthesiology | Admitting: Anesthesiology

## 2017-01-17 ENCOUNTER — Encounter: Payer: Self-pay | Admitting: Anesthesiology

## 2017-01-17 ENCOUNTER — Other Ambulatory Visit: Payer: Self-pay | Admitting: Anesthesiology

## 2017-01-17 DIAGNOSIS — R52 Pain, unspecified: Secondary | ICD-10-CM | POA: Insufficient documentation

## 2017-01-17 DIAGNOSIS — M47817 Spondylosis without myelopathy or radiculopathy, lumbosacral region: Secondary | ICD-10-CM

## 2017-01-17 DIAGNOSIS — M961 Postlaminectomy syndrome, not elsewhere classified: Secondary | ICD-10-CM

## 2017-01-17 DIAGNOSIS — M4697 Unspecified inflammatory spondylopathy, lumbosacral region: Secondary | ICD-10-CM | POA: Insufficient documentation

## 2017-01-17 DIAGNOSIS — M5432 Sciatica, left side: Secondary | ICD-10-CM

## 2017-01-17 MED ORDER — ROPIVACAINE HCL 5 MG/ML IJ SOLN: 10.0000 mL | Freq: Once | INTRAMUSCULAR | Status: DC

## 2017-01-17 MED ORDER — ROPIVACAINE HCL 5 MG/ML IJ SOLN
INTRAMUSCULAR | Status: AC
  Filled 2017-01-17: qty 20

## 2017-01-17 MED ORDER — IOPAMIDOL (ISOVUE-M 200) INJECTION 41%
INTRAMUSCULAR | Status: AC
  Filled 2017-01-17: qty 10

## 2017-01-17 MED ORDER — IOPAMIDOL (ISOVUE-M 200) INJECTION 41%: 20.0000 mL | Freq: Once | INTRAMUSCULAR | Status: DC | PRN

## 2017-01-17 MED ORDER — MIDAZOLAM HCL 5 MG/5ML IJ SOLN
INTRAMUSCULAR | Status: AC
  Filled 2017-01-17: qty 5

## 2017-01-17 MED ORDER — MIDAZOLAM HCL 5 MG/5ML IJ SOLN
5.0000 mg | Freq: Once | INTRAMUSCULAR | Status: DC
Start: 2017-01-17 — End: 2017-01-17

## 2017-01-17 MED ORDER — TRIAMCINOLONE ACETONIDE 40 MG/ML IJ SUSP: 40.0000 mg | Freq: Once | INTRAMUSCULAR | Status: DC

## 2017-01-17 MED ORDER — SODIUM CHLORIDE 0.9 % IJ SOLN
INTRAMUSCULAR | Status: AC
  Filled 2017-01-17: qty 10

## 2017-01-17 MED ORDER — LIDOCAINE HCL (PF) 1 % IJ SOLN
INTRAMUSCULAR | Status: AC
  Filled 2017-01-17: qty 5

## 2017-01-17 MED ORDER — LIDOCAINE HCL (PF) 1 % IJ SOLN: 5.0000 mL | Freq: Once | INTRAMUSCULAR | Status: DC

## 2017-01-17 MED ORDER — TRIAMCINOLONE ACETONIDE 40 MG/ML IJ SUSP
INTRAMUSCULAR | Status: AC
  Filled 2017-01-17: qty 1

## 2017-01-17 MED ORDER — SODIUM CHLORIDE 0.9% FLUSH: 10.0000 mL | Freq: Once | INTRAVENOUS | Status: DC

## 2017-01-17 MED ORDER — LACTATED RINGERS IV SOLN: 1000.0000 mL | INTRAVENOUS | Status: DC

## 2017-01-17 NOTE — Progress Notes (Signed)
Subjective:  Patient ID: Jasmine Webster, female    DOB: [birth year]  Age: 83 y.o. MRN: [id number]  CC: Back Pain (lower, center)   Service Provided on Last Visit: Evaluation PROCEDURE:Caudal epidural steroid No. 1 under fluoroscopic guidance with moderate sedation  HPI Jasmine Webster presents for return today. She was last seen as a new patient and continues to have left lower back pain as her primary complaint with radiation into the left hip and posterior buttock region. This pain continues to be worse with prolonged standing and rotation and the Vicodin she recently started was ineffective. She subsequently stopped that. She is also stopped her Neurontin as she does not feel that that helps either. The pain remains quite intense and is discouraging and disabling.  By history She is a pleasant 83 year old white female with a long-standing history of low back pain. She describes having had multiple procedures on her low back and neck and has undergone a low thoracic to lower lumbar spinal fusion. She was recently seen by Dr. Earle Gell and subsequently referred to [name] for her sciatica. She experiences this primarily in the left lower back with radiation into the left posterior leg and describes it as a maximum pain score of 6 that does not appear to be influenced by time of day but is worse with prolonged sitting. Alleviating factors include lying down and particular positions to decompress her back seems to help. She does not have any appreciable lower extremity weakness. She occasionally gets some cramping type pain especially in the left lower back with radiation into the buttocks and hip that is burning and constant. She cannot get any relief at this time and has been on medication management and has gone through conservative therapy without significant improvement. She also has had a previous CT myelogram and I reviewed the results of this today.  History Jyra has a past medical history  of Arthritis; Cataract; and Diabetes mellitus without complication ([place]).   She has a past surgical history that includes Back surgery; Appendectomy; Abdominal hysterectomy; Tonsillectomy and adenoidectomy; Cataract extraction; and Thoracolumbar syringo shunt.   Her family history includes Cancer in her brother and brother; Diabetes in her father; Heart disease in her mother and sister; Pneumonia in her father; Stroke in her brother, father, and sister; Sudden death in her brother, sister, and sister.She reports that she has never smoked. She has never used smokeless tobacco. She reports that she does not drink alcohol or use drugs.  No results found for this or any previous visit.  No results found for: TOXASSSELUR  Outpatient Medications Prior to Visit  Medication Sig Dispense Refill  . ALPRAZolam (XANAX) 0.5 MG tablet TAKE ONE TABLET (0.5 MG TOTAL) BY MOUTH AT BEDTIME AS NEEDED 30 tablet 5  . amLODipine (NORVASC) 5 MG tablet Take 1 tablet (5 mg total) by mouth daily. 90 tablet 3  . aspirin 81 MG tablet Take 81 mg by mouth daily.    Marland Kitchen glucose blood (ONE TOUCH ULTRA TEST) test strip Check sugar twice daily. 100 each 3  . Insulin Pen Needle 31G X 8 MM MISC Check sugar 2 times daily, DX E11.9 (needs ultra fine pen needles) 90 each 12  . LANTUS SOLOSTAR 100 UNIT/ML Solostar Pen INJECT 28 UNITS SUBCUTANEOUSLY AT BEDTIME 3 pen 3  . Multiple Vitamins-Minerals (PRESERVISION AREDS) CAPS Take 2 capsules by mouth daily.     . Omega-3 Fatty Acids (FISH OIL) 1000 MG CAPS Take 1,000 mg by mouth daily.     Marland Kitchen  omeprazole (PRILOSEC) 20 MG capsule Take 1 capsule by mouth as needed.     . quinapril-hydrochlorothiazide (ACCURETIC) 20-12.5 MG tablet Take 1 tablet by mouth 2 (two) times daily. 180 tablet 3  . gabapentin (NEURONTIN) 100 MG capsule 200 mg 2 (two) times daily.     Marland Kitchen HYDROcodone-acetaminophen (NORCO/VICODIN) 5-325 MG tablet Take 1 tablet by mouth 2 (two) times daily. (Patient not taking: Reported on  01/17/2017) 60 tablet 0  . lidocaine (LIDODERM) 5 % Place 1 patch onto the skin daily. Remove & Discard patch within 12 hours or as directed by MD (Patient not taking: Reported on 01/17/2017) 60 patch 11   No facility-administered medications prior to visit.    Lab Results  Component Value Date   WBC 6.5 06/20/2016   HGB 12.3 12/27/2013   HCT 37.9 06/20/2016   PLT CANCELED 06/20/2016   GLUCOSE 278 (H) 06/20/2016   CHOL 258 (H) 09/13/2016   TRIG 230 (H) 09/13/2016   HDL 52 09/13/2016   LDLCALC 160 (H) 09/13/2016   ALT 24 06/20/2016   AST 30 06/20/2016   NA 140 06/20/2016   K 4.6 06/20/2016   CL 100 06/20/2016   CREATININE 1.21 (H) 06/20/2016   BUN 31 (H) 06/20/2016   CO2 21 06/20/2016   TSH 2.270 06/20/2016   HGBA1C 7.8 (H) 09/13/2016    --------------------------------------------------------------------------------------------------------------------- Ct Lumbar Spine W Contrast  Result Date: 12/18/2016 CLINICAL DATA:  Back pain EXAM: CT MYELOGRAPHY LUMBAR SPINE TECHNIQUE: CT imaging of the lumbar spine was performed after intrathecal contrast administration. Multiplanar CT image reconstructions were also generated. COMPARISON:  None. FINDINGS: There are bilateral pedicle screws in T12 through S1. There are cross stabilizing bars extending from the thoracic spine to the L4 pedicle screws. There are cross stabilizing bars extending from the L5-S1 pedicle screws. There are disc spacers present at every level except for L1-2 in above. There is solid fusion in the posterior elements from T11 through S1. The study extends from the mid T11 level. There is also solid fusion across the L2-3, L3-4, and L5-S1 discs. Fusion across the L4-5 disc has not yet occurred. Segmentation:  5 lumbar type vertebral bodies. Alignment: Normal. Vertebrae: There is no vertebral compression deformity. Conus medullaris: Extends to the L1-2 disc level and appears normal. Paraspinal and other soft tissues: Aortic  atherosclerotic calcification. Disc levels: L1-2: Central canal is widely patent. L2-3:  Central canal is widely patent.  Posterior decompression. L3-4:  Central canal is widely patent.  Posterior decompression. L4-5: Posterior decompression. Central canal is widely patent. Disc osteophytes to encroach upon the foramina bilaterally. This results in mild foraminal narrowing. L5-S1: Central canal is widely patent. Disc osteophytes mildly encroach upon the foramina bilaterally. No obvious narrowing. Posterior decompression. IMPRESSION: Thoracolumbar fusion is solid as described above. This includes solid fusion across the posterior elements at L4-5 without solid fusion across the disc space. I suspect that the apparent abnormal motion noted on plain radiographs was artifactual and fusion is solid and intact. There is mild foraminal narrowing at L4-5 secondary to disc osteophytes. Electronically Signed   By: Marybelle Killings M.D.   On: 12/18/2016 15:15   Dg Myelography Lumbar Inj Lumbosacral  Result Date: 12/18/2016 CLINICAL DATA:  Back pain.  Lumbar fusion. EXAM: LUMBAR MYELOGRAM FLUOROSCOPY TIME:  37 seconds.  15.6 mGy. PROCEDURE: After thorough discussion of risks and benefits of the procedure including bleeding, infection, injury to nerves, blood vessels, adjacent structures as well as headache and CSF leak, written and oral  informed consent was obtained. Consent was obtained by Dr. Marybelle Killings. Time out form was completed. Patient was positioned prone on the fluoroscopy table. Local anesthesia was provided with 1% lidocaine without epinephrine after prepped and draped in the usual sterile fashion. Puncture was performed at L3-4 using a 3 1/2 inch 22-gauge spinal needle via a midline approach. Using a single pass through the dura, the needle was placed within the thecal sac, with return of clear CSF. 15 mL of Omnipaque-180 was injected into the thecal sac, with normal opacification of the nerve roots and cauda  equina consistent with free flow within the subarachnoid space. I personally performed the lumbar puncture and administered the intrathecal contrast. I also personally supervised acquisition of the myelogram images. TECHNIQUE: Contiguous axial images were obtained through the Lumbar spine after the intrathecal infusion of infusion. Coronal and sagittal reconstructions were obtained of the axial image sets. COMPARISON:  None FINDINGS: There are bilateral pedicle screw from L1 through S1 as well as in the lower thoracic spine. Cross stabilizing bars extend from the thoracic spine to the L4 pedicle screws. There are also L5-S1 pedicle screws with cross stabilizing bars. There is no breakage or loosening of the hardware. There is anatomic alignment of the vertebral bodies. There is no vertebral compression deformity. No obvious spinal stenosis is present. A disc spacer is present at L4-5. There is some motion in the disc between flexion and extension views. IMPRESSION: Extensive thoracolumbosacral fusion as described. There is noted to be some degree of motion at the L4-5 level. A disc spacer is present at this level but there is no posterior stabilization. Electronically Signed   By: Marybelle Killings M.D.   On: 12/18/2016 14:38       ---------------------------------------------------------------------------------------------------------------------- Past Medical History:  Diagnosis Date  . Arthritis   . Cataract    cataract removal bilaterally approx 20 years ago  . Diabetes mellitus without complication [facility])     Past Surgical History:  Procedure Laterality Date  . ABDOMINAL HYSTERECTOMY    . APPENDECTOMY    . BACK SURGERY     three  . CATARACT EXTRACTION    . THORACOLUMBAR SYRINGO SHUNT    . TONSILLECTOMY AND ADENOIDECTOMY      Family History  Problem Relation Age of Onset  . Heart disease Mother     died from MI  . Heart disease Sister   . Sudden death Brother     shot and beaten to  death during home invasion  . Stroke Sister   . Sudden death Sister     MVA  . Cancer Brother     died from lung cancer  . Sudden death Sister     84 days old  . Cancer Brother     died from lung cancer  . Stroke Brother     cause of death  . Diabetes Father   . Stroke Father   . Pneumonia Father     Social History  Substance Use Topics  . Smoking status: Never Smoker  . Smokeless tobacco: Never Used  . Alcohol use No    ---------------------------------------------------------------------------------------------------------------------  Scheduled Meds: Continuous Infusions: PRN Meds:.   BP (!) 130/56   Pulse 82   Temp (!) 96.4 F (35.8 C) (Oral)   Resp 16   Ht 5\' 4"  (1.626 m)   Wt 165 lb (74.8 kg)   SpO2 99%   BMI 28.32 kg/m    BP Readings from Last 3 Encounters:  01/17/17 Marland Kitchen)  130/56  01/06/17 (!) 118/57  12/18/16 (!) 127/53     Wt Readings from Last 3 Encounters:  01/17/17 165 lb (74.8 kg)  01/06/17 165 lb (74.8 kg)  11/28/16 171 lb (77.6 kg)     ----------------------------------------------------------------------------------------------------------------------  ROS Review of Systems  Cardiac: No angina takes a daily aspirin with a history of a murmur GI: History of ulcers but no hematemesis or hematochezia The remainder of review of systems was negative  Objective:  BP (!) 130/56   Pulse 82   Temp (!) 96.4 F (35.8 C) (Oral)   Resp 16   Ht 5\' 4"  (1.626 m)   Wt 165 lb (74.8 kg)   SpO2 99%   BMI 28.32 kg/m   Physical Exam Pupils are equally round reactive to light extraocular muscles are intact and the patient is a good historian Heart reveals a 2/6 systolic ejection murmur with regular rhythm and rate Lungs are clear to auscultation without wheeze She is a well-healed midline scar in the lumbar region. She has limited range of motion upon extension or flexion. No significant paraspinous muscle tenderness is noted. Strength in the  lower extremities appears to be intact with good muscle tone and bulk Without change from previous examination.     Assessment & Plan:   Kristee was seen today for back pain.  Diagnoses and all orders for this visit:  Failed back surgical syndrome -     Lumbar Epidural Injection  Facet arthritis of lumbosacral region ([place]) -     Lumbar Epidural Injection -     Lumbar Epidural Injection -     LUMBAR FACET(MEDIAL BRANCH NERVE BLOCK) MBNB; Future  Sciatica of left side -     Lumbar Epidural Injection  Other orders -     triamcinolone acetonide (KENALOG-40) injection 40 mg; 1 mL (40 mg total) by Other route once. -     sodium chloride flush (NS) 0.9 % injection 10 mL; 10 mLs by Other route once. -     midazolam (VERSED) injection 5 mg; Inject 5 mLs (5 mg total) into the vein once. -     lidocaine (PF) (XYLOCAINE) 1 % injection 5 mL; Inject 5 mLs into the skin once. -     lactated ringers infusion 1,000 mL; Inject 1,000 mLs into the vein continuous. -     iopamidol (ISOVUE-M) 41 % intrathecal injection 20 mL; 20 mLs by Other route once as needed for contrast. -     ropivacaine (PF) 2 mg/mL (0.2%) (NAROPIN) injection 10 mL; 10 mLs by Epidural route once.     ----------------------------------------------------------------------------------------------------------------------  Problem List Items Addressed This Visit    None    Visit Diagnoses    Failed back surgical syndrome       Facet arthritis of lumbosacral region [facility])       Relevant Orders   Lumbar Epidural Injection   LUMBAR FACET(MEDIAL BRANCH NERVE BLOCK) MBNB   Sciatica of left side          ----------------------------------------------------------------------------------------------------------------------  1. Failed back surgical syndrome Based on her findings today we will plan on a caudal epidural steroid Today. We will have her return to clinic in 1 month for reevaluation and possible repeat epidural  injection at that time. If she fails to gain any significant improvement we may also consider a facet injection for diagnostic and therapeutic purpose as much of her pain does appear to be facet genic based on clinical findings.- Lumbar Epidural Injection; Future  2.  Facet arthritis of lumbosacral region ([place])  3. Sciatica of left side As above - Lumbar Epidural Injection; Future  4. Low back pain at multiple sites She may restart her Vicodin and use it periodically if necessary for pain treatment as previously discussed.   ----------------------------------------------------------------------------------------------------------------------  I am having Ms. Bissonette maintain her omeprazole, glucose blood, Insulin Pen Needle, quinapril-hydrochlorothiazide, amLODipine, LANTUS SOLOSTAR, gabapentin, PRESERVISION AREDS, aspirin, ALPRAZolam, Fish Oil, lidocaine, and HYDROcodone-acetaminophen.   No orders of the defined types were placed in this encounter.   Procedure: Total epidural steroid No. 1 under fluoroscopic guidance with moderate sedation  After informed consent was obtained and the risks benefits reviewed patient chose to pursue a caudal epidural steroid injection today. With the patient in the prone position and an IV in place, sedation was with IV versed. Using AP and lateral fluoroscopic guidance I identified the area overlying the sacral hiatus. This area was broadly prepped with DuraPrep 3 and we utilized strict aseptic technique during the procedure. 1% lidocaine was infiltrated 2 cc using a 25-gauge needle overlying the sacral cornu and then using lateral fluoroscopic guidance I advanced an 18-gauge Touhy needle approximately 2 cm through the sacral hiatus. Confirmation was with 2 cc of Isovue yielding good epidural spread and no evidence of IV or subarachnoid uptake. This was followed by an injection of 5 cc of saline mixed with 1 cc of ropivacaine 0.2% and 40 mg of triamcinolone. This  was tolerated without difficulty the patient was convalesced and discharged home in stable condition for follow-up as mentioned. JA  Follow-up: Return for evaluation, procedure.    Molli Barrows, MD 9:33 AM  The [place] practitioner database for opioid medications on this patient has been reviewed by me and my staff   Greater than 50% of the total encounter time was spent in counseling and / or coordination of care.     This dictation was performed utilizing Systems analyst.  Please excuse any unintentional or mistaken typographical errors as a result.

## 2017-01-17 NOTE — Progress Notes (Signed)
Safety precautions to be maintained throughout the outpatient stay will include: orient to surroundings, keep bed in low position, maintain call bell within reach at all times, provide assistance with transfer out of bed and ambulation.  

## 2017-01-17 NOTE — Patient Instructions (Addendum)
Come prepared to your next appointment to have another procedure  If needed.  Do not eat or drink for 8 hours prior to appointment time.  Bring a driver.  Take blood pressure medication the morning of procedure if applicable.      Epidural Steroid Injection An epidural steroid injection is given to relieve pain in your neck, back, or legs that is caused by the irritation or swelling of a nerve root. This procedure involves injecting a steroid and numbing medicine (anesthetic) into the epidural space. The epidural space is the space between the outer covering of your spinal cord and the bones that form your backbone (vertebra).  LET [facility] CARE PROVIDER KNOW ABOUT:   Any allergies you have.  All medicines you are taking, including vitamins, herbs, eye drops, creams, and over-the-counter medicines such as aspirin.  Previous problems you or members of your family have had with the use of anesthetics.  Any blood disorders or blood clotting disorders you have.  Previous surgeries you have had.  Medical conditions you have.  RISKS AND COMPLICATIONS Generally, this is a safe procedure. However, as with any procedure, complications can occur. Possible complications of epidural steroid injection include:  Headache.  Bleeding.  Infection.  Allergic reaction to the medicines.  Damage to your nerves. The response to this procedure depends on the underlying cause of the pain and its duration. People who have long-term (chronic) pain are less likely to benefit from epidural steroids than are those people whose pain comes on strong and suddenly.  BEFORE THE PROCEDURE   Ask your health care provider about changing or stopping your regular medicines. You may be advised to stop taking blood-thinning medicines a few days before the procedure.  You may be given medicines to reduce anxiety.  Arrange for someone to take you home after the procedure.  PROCEDURE   You will remain awake  during the procedure. You may receive medicine to make you relaxed.  You will be asked to lie on your stomach.  The injection site will be cleaned.  The injection site will be numbed with a medicine (local anesthetic).  A needle will be injected through your skin into the epidural space.  Your health care provider will use an X-ray machine to ensure that the steroid is delivered closest to the affected nerve. You may have minimal discomfort at this time.  Once the needle is in the right position, the local anesthetic and the steroid will be injected into the epidural space.  The needle will then be removed and a bandage will be applied to the injection site.  AFTER THE PROCEDURE   You may be monitored for a short time before you go home.  You may feel weakness or numbness in your arm or leg, which disappears within hours.  You may be allowed to eat, drink, and take your regular medicine.  You may have soreness at the site of the injection.   This information is not intended to replace advice given to you by your health care provider. Make sure you discuss any questions you have with your health care provider.   Document Released: 02/11/2008 Document Revised: 07/07/2013 Document Reviewed: 04/23/2013 Elsevier Interactive Patient Education 2016 [place]  What are the risk, side effects and possible complications? Generally speaking, most procedures are safe.  However, with any procedure there are risks, side effects, and the possibility of complications.  The risks and complications are dependent upon the  sites that are lesioned, or the type of nerve block to be performed.  The closer the procedure is to the spine, the more serious the risks are.  Great care is taken when placing the radio frequency needles, block needles or lesioning probes, but sometimes complications can occur. Infection: Any time there is an injection through the skin, there  is a risk of infection.  This is why sterile conditions are used for these blocks. There are four possible types of infection: 1. Localized skin infection. 2. Central Nervous System Infection: This can be in the form of Meningitis, which can be deadly. 3. Epidural Infections: This can be in the form of an epidural abscess, which can cause pressure inside of the spine, causing compression of the spinal cord with subsequent paralysis. This would require an emergency surgery to decompress, and there are no guarantees that the patient would recover from the paralysis. 4. Discitis: This is an infection of the intervertebral discs. It occurs in about 1% of discography procedures. It is difficult to treat and it may lead to surgery. Pain: the needles have to go through skin and soft tissues, will cause soreness. Damage to internal structures:  The nerves to be lesioned may be near blood vessels or other nerves which can be potentially damaged. Bleeding: Bleeding is more common if the patient is taking blood thinners such as  aspirin, Coumadin, Ticiid, Plavix, etc., or if he/she have some genetic predisposition such as hemophilia. Bleeding into the spinal canal can cause compression of the spinal  cord with subsequent paralysis.  This would require an emergency surgery to decompress and there are no guarantees that the patient would recover from the paralysis. Pneumothorax: Puncturing of a lung is a possibility, every time a needle is introduced in the area of the chest or upper back.  Pneumothorax refers to free air around the collapsed lung(s), inside of the thoracic cavity (chest cavity).  Another two possible complications related to a similar event would include: Hemothorax and Chylothorax. These are variations of the Pneumothorax, where instead of air around the collapsed lung(s), you may have blood or chyle, respectively. Spinal headaches: They may occur with any procedures in the area of the  spine. Persistent CSF (Cerebro-Spinal Fluid) leakage: This is a rare problem, but may occur with prolonged intrathecal or epidural catheters either due to the formation of a fistulous track or a dural tear. Nerve damage: By working so close to the spinal cord, there is always a possibility of nerve damage, which could be as serious as a permanent spinal cord injury with paralysis. Death: Although rare, severe deadly allergic reactions known as "Anaphylactic reaction" can occur to any of the medications used. Worsening of the symptoms: We can always make thing worse.  What are the chances of something like this happening? Chances of any of this occuring are extremely low.  By statistics, you have more of a chance of getting killed in a motor vehicle accident: while driving to the hospital than any of the above occurring .  Nevertheless, you should be aware that they are possibilities.  In general, it is similar to taking a shower.  Everybody knows that you can slip, hit your head and get killed.  Does that mean that you should not shower again?  Nevertheless always keep in mind that statistics do not mean anything if you happen to be on the wrong side of them.  Even if a procedure has a 1 (one) in a 1,000,000 (million)  chance of going wrong, it you happen to be that one..Also, keep in mind that by statistics, you have more of a chance of having something go wrong when taking medications.  Who should not have this procedure? If you are on a blood thinning medication (e.g. Coumadin, Plavix, see list of "Blood Thinners"), or if you have an active infection going on, you should not have the procedure.  If you are taking any blood thinners, please inform your physician.  Preparing for your procedure: 1. Do not eat or drink anything at least eight (8) hours prior to the procedure. 2. Bring a driver with you .  It cannot be a taxi. 3. Come accompanied by an adult that can drive you back, and that is strong  enough to help you if your legs get weak or numb from the local anesthetic. 4. Take all of your medicines the morning of the procedure with just enough water to swallow them. 5. If you have diabetes, make sure that you are scheduled to have your procedure done first thing in the morning, whenever possible. 6. If you have diabetes, take only half of your insulin dose and notify our nurse that you have done so as soon as you arrive at the clinic. 7. If you are diabetic, but only take blood sugar pills (oral hypoglycemic), then do not take them on the morning of your procedure.  You may take them after you have had the procedure. 8. Do not take aspirin or any aspirin-containing medications, at least eleven (11) days prior to the procedure.  They may prolong bleeding. 9. Wear loose fitting clothing that may be easy to take off and that you would not mind if it got stained with Betadine or blood. 10. Do not wear any jewelry or perfume 11. Remove any nail coloring.  It will interfere with some of our monitoring equipment. 12. If you take Metformin for your diabetes, stop it 48 hours prior to the procedure.  NOTE: Remember that this is not meant to be interpreted as a complete list of all possible complications.  Unforeseen problems may occur.  BLOOD THINNERS The following drugs contain aspirin or other products, which can cause increased bleeding during surgery and should not be taken for 2 weeks prior to and 1 week after surgery.  If you should need take something for relief of minor pain, you may take acetaminophen which is found in Tylenol,m Datril, Anacin-3 and Panadol. It is not blood thinner. The products listed below are.  Do not take any of the products listed below in addition to any listed on your instruction sheet.  A.P.C or A.P.C with Codeine Codeine Phosphate Capsules #3 Ibuprofen Ridaura  ABC compound Congesprin Imuran rimadil  Advil Cope Indocin Robaxisal  Alka-Seltzer Effervescent Pain  Reliever and Antacid Coricidin or Coricidin-D  Indomethacin Rufen  Alka-Seltzer plus Cold Medicine Cosprin Ketoprofen S-A-C Tablets  Anacin Analgesic Tablets or Capsules Coumadin Korlgesic Salflex  Anacin Extra Strength Analgesic tablets or capsules CP-2 Tablets Lanoril Salicylate  Anaprox Cuprimine Capsules Levenox Salocol  Anexsia-D Dalteparin Magan Salsalate  Anodynos Darvon compound Magnesium Salicylate Sine-off  Ansaid Dasin Capsules Magsal Sodium Salicylate  Anturane Depen Capsules Marnal Soma  APF Arthritis pain formula Dewitt's Pills Measurin Stanback  Argesic Dia-Gesic Meclofenamic Sulfinpyrazone  Arthritis Bayer Timed Release Aspirin Diclofenac Meclomen Sulindac  Arthritis pain formula Anacin Dicumarol Medipren Supac  Analgesic (Safety coated) Arthralgen Diffunasal Mefanamic Suprofen  Arthritis Strength Bufferin Dihydrocodeine Mepro Compound Suprol  Arthropan liquid Dopirydamole Methcarbomol  with Aspirin Synalgos  ASA tablets/Enseals Disalcid Micrainin Tagament  Ascriptin Doan's Midol Talwin  Ascriptin A/D Dolene Mobidin Tanderil  Ascriptin Extra Strength Dolobid Moblgesic Ticlid  Ascriptin with Codeine Doloprin or Doloprin with Codeine Momentum Tolectin  Asperbuf Duoprin Mono-gesic Trendar  Aspergum Duradyne Motrin or Motrin IB Triminicin  Aspirin plain, buffered or enteric coated Durasal Myochrisine Trigesic  Aspirin Suppositories Easprin Nalfon Trillsate  Aspirin with Codeine Ecotrin Regular or Extra Strength Naprosyn Uracel  Atromid-S Efficin Naproxen Ursinus  Auranofin Capsules Elmiron Neocylate Vanquish  Axotal Emagrin Norgesic Verin  Azathioprine Empirin or Empirin with Codeine Normiflo Vitamin E  Azolid Emprazil Nuprin Voltaren  Bayer Aspirin plain, buffered or children's or timed BC Tablets or powders Encaprin Orgaran Warfarin Sodium  Buff-a-Comp Enoxaparin Orudis Zorpin  Buff-a-Comp with Codeine Equegesic Os-Cal-Gesic   Buffaprin Excedrin plain, buffered or  Extra Strength Oxalid   Bufferin Arthritis Strength Feldene Oxphenbutazone   Bufferin plain or Extra Strength Feldene Capsules Oxycodone with Aspirin   Bufferin with Codeine Fenoprofen Fenoprofen Pabalate or Pabalate-SF   Buffets II Flogesic Panagesic   Buffinol plain or Extra Strength Florinal or Florinal with Codeine Panwarfarin   Buf-Tabs Flurbiprofen Penicillamine   Butalbital Compound Four-way cold tablets Penicillin   Butazolidin Fragmin Pepto-Bismol   Carbenicillin Geminisyn Percodan   Carna Arthritis Reliever Geopen Persantine   Carprofen Gold's salt Persistin   Chloramphenicol Goody's Phenylbutazone   Chloromycetin Haltrain Piroxlcam   Clmetidine heparin Plaquenil   Cllnoril Hyco-pap Ponstel   Clofibrate Hydroxy chloroquine Propoxyphen         Before stopping any of these medications, be sure to consult the physician who ordered them.  Some, such as Coumadin (Warfarin) are ordered to prevent or treat serious conditions such as "deep thrombosis", "pumonary embolisms", and other heart problems.  The amount of time that you may need off of the medication may also vary with the medication and the reason for which you were taking it.  If you are taking any of these medications, please make sure you notify your pain physician before you undergo any procedures.Post-procedure Information What to expect: Most procedures involve the use of a local anesthetic (numbing medicine), and a steroid (anti-inflammatory medicine).  The local anesthetics may cause temporary numbness and weakness of the legs or arms, depending on the location of the block. This numbness/weakness may last 4-6 hours, depending on the local anesthetic used. In rare instances, it can last up to 24 hours. While numb, you must be very careful not to injure the extremity.  After any procedure, you could expect the pain to get better within 15-20 minutes. This relief is temporary and may last 4-6 hours. Once the local  anesthetics wears off, you could experience discomfort, possibly more than usual, for up to 10 (ten) days. In the case of radiofrequencies, it may last up to 6 weeks. Surgeries may take up to 8 weeks for the healing process. The discomfort is due to the irritation caused by needles going through skin and muscle. To minimize the discomfort, we recommend using ice the first day, and heat from then on. The ice should be applied for 15 minutes on, and 15 minutes off. Keep repeating this cycle until bedtime. Avoid applying the ice directly to the skin, to prevent frostbite. Heat should be used daily, until the pain improves (4-10 days). Be careful not to burn yourself.  Occasionally you may experience muscle spasms or cramps. These occur as a consequence of the irritation caused by the needle sticks to  the muscle and the blood that will inevitably be lost into the surrounding muscle tissue. Blood tends to be very irritating to tissues, which tend to react by going into spasm. These spasms may start the same day of your procedure, but they may also take days to develop. This late onset type of spasm or cramp is usually caused by electrolyte imbalances triggered by the steroids, at the level of the kidney. Cramps and spasms tend to respond well to muscle relaxants, multivitamins (some are triggered by the procedure, but may have their origins in vitamin deficiencies), and "Gatorade", or any sports drinks that can replenish any electrolyte imbalances. (If you are a diabetic, ask your pharmacist to get you a sugar-free brand.) Warm showers or baths may also be helpful. Stretching exercises are highly recommended. General Instructions:  Be alert for signs of possible infection: redness, swelling, heat, red streaks, elevated temperature, and/or fever. These typically appear 4 to 6 days after the procedure. Immediately notify your doctor if you experience unusual bleeding, difficulty breathing, or loss of bowel or bladder  control. If you experience increased pain, do not increase your pain medicine intake, unless instructed by your pain physician. Post-Procedure Care:  Be careful in moving about. Muscle spasms in the area of the injection may occur. Applying ice or heat to the area is often helpful. The incidence of spinal headaches after epidural injections ranges between 1.4% and 6%. If you develop a headache that does not seem to respond to conservative therapy, please let your physician know. This can be treated with an epidural blood patch.   Post-procedure numbness or redness is to be expected, however it should average 4 to 6 hours. If numbness and weakness of your extremities begins to develop 4 to 6 hours after your procedure, and is felt to be progressing and worsening, immediately contact your physician.   Diet:  If you experience nausea, do not eat until this sensation goes away. If you had a "Stellate Ganglion Block" for upper extremity "Reflex Sympathetic Dystrophy", do not eat or drink until your hoarseness goes away. In any case, always start with liquids first and if you tolerate them well, then slowly progress to more solid foods. Activity:  For the first 4 to 6 hours after the procedure, use caution in moving about as you may experience numbness and/or weakness. Use caution in cooking, using household electrical appliances, and climbing steps. If you need to reach your Doctor call our office: [phone] Monday-Thursday 8:00 am - 4:00 PM    Fridays: Closed     In case of an emergency: In case of emergency, call 911 or go to the nearest emergency room and have the physician there call us.  Interpretation of Procedure Every nerve block has two components: a diagnostic component, and a treatment component. Unrealistic expectations are the most common causes of "perceived failure".  In a perfect world, a single nerve block should be able to completely and permanently eliminate the pain. Sadly, the  world is not perfect.  Most pain management nerve blocks are performed using local anesthetics and steroids. Steroids are responsible for any long-term benefit that you may experience. Their purpose is to decrease any chronic swelling that may exist in the area. Steroids begin to work immediately after being injected. However, most patients will not experience any benefits until 5 to 10 days after the injection, when the swelling has come down to the point where they can tell a difference. Steroids will only help  if there is swelling to be treated. As such, they can assist with the diagnosis. If effective, they suggest an inflammatory component to the pain, and if ineffective, they rule out inflammation as the main cause or component of the problem. If the problem is one of mechanical compression, you will get no benefit from those steroids.   In the case of local anesthetics, they have a crucial role in the diagnosis of your condition. Most will begin to work within15 to 20 minutes after injection. The duration will depend on the type used (short- vs. Long-acting). It is of outmost importance that patients keep tract of their pain, after the procedure. To assist with this matter, a "Post-procedure Pain Diary" is provided. Make sure to complete it and to bring it back to your follow-up appointment.  As long as the patient keeps accurate, detailed records of their symptoms after every procedure, and returns to have those interpreted, every procedure will provide [name] with invaluable information. Even a block that does not provide the patient with any relief, will always provide [name] with information about the mechanism and the origin of the pain. The only time a nerve block can be considered a waste of time is when patients do not keep track of the results, or do not keep their post-procedure appointment.  Reporting the results back to your physician The Pain Score  Pain is a subjective complaint. It cannot be  seen, touched, or measured. We depend entirely on the patient's report of the pain in order to assess your condition and treatment. To evaluate the pain, we use a pain scale, where "0" means "No Pain", and a "10" is "the worst possible pain that you can even imagine" (i.e. something like been eaten alive by a shark or being torn apart by a lion).   You will frequently be asked to rate your pain. Please be as accurate, remember that medical decisions will be based on your responses. Please do not rate your pain above a 10. Doing so is actually interpreted as "symptom magnification" (exaggeration), as well as lack of understanding with regards to the scale. To put this into perspective, when you tell us that your pain is at a 10 (ten), what you are saying is that there is nothing we can do to make this pain any worse. (Carefully think about that.)Facet Blocks Patient Information  Description: The facets are joints in the spine between the vertebrae.  Like any joints in the body, facets can become irritated and painful.  Arthritis can also effect the facets.  By injecting steroids and local anesthetic in and around these joints, we can temporarily block the nerve supply to them.  Steroids act directly on irritated nerves and tissues to reduce selling and inflammation which often leads to decreased pain.  Facet blocks may be done anywhere along the spine from the neck to the low back depending upon the location of your pain.   After numbing the skin with local anesthetic (like Novocaine), a small needle is passed onto the facet joints under x-ray guidance.  You may experience a sensation of pressure while this is being done.  The entire block usually lasts about 15-25 minutes.   Conditions which may be treated by facet blocks:   Low back/buttock pain  Neck/shoulder pain  Certain types of headaches  Preparation for the injection:  1. Do not eat any solid food or dairy products within 8 hours of your  appointment. 2. You may drink clear  liquid up to 3 hours before appointment.  Clear liquids include water, black coffee, juice or soda.  No milk or cream please. 3. You may take your regular medication, including pain medications, with a sip of water before your appointment.  Diabetics should hold regular insulin (if taken separately) and take 1/2 normal NPH dose the morning of the procedure.  Carry some sugar containing items with you to your appointment. 4. A driver must accompany you and be prepared to drive you home after your procedure. 5. Bring all your current medications with you. 6. An IV may be inserted and sedation may be given at the discretion of the physician. 7. A blood pressure cuff, EKG and other monitors will often be applied during the procedure.  Some patients may need to have extra oxygen administered for a short period. 8. You will be asked to provide medical information, including your allergies and medications, prior to the procedure.  We must know immediately if you are taking blood thinners (like Coumadin/Warfarin) or if you are allergic to IV iodine contrast (dye).  We must know if you could possible be pregnant.  Possible side-effects:   Bleeding from needle site  Infection (rare, may require surgery)  Nerve injury (rare)  Numbness & tingling (temporary)  Difficulty urinating (rare, temporary)  Spinal headache (a headache worse with upright posture)  Light-headedness (temporary)  Pain at injection site (serveral days)  Decreased blood pressure (rare, temporary)  Weakness in arm/leg (temporary)  Pressure sensation in back/neck (temporary)   Call if you experience:   Fever/chills associated with headache or increased back/neck pain  Headache worsened by an upright position  New onset, weakness or numbness of an extremity below the injection site  Hives or difficulty breathing (go to the emergency room)  Inflammation or drainage at the injection  site(s)  Severe back/neck pain greater than usual  New symptoms which are concerning to you  Please note:  Although the local anesthetic injected can often make your back or neck feel good for several hours after the injection, the pain will likely return. It takes 3-7 days for steroids to work.  You may not notice any pain relief for at least one week.  If effective, we will often do a series of 2-3 injections spaced 3-6 weeks apart to maximally decrease your pain.  After the initial series, you may be a candidate for a more permanent nerve block of the facets.  If you have any questions, please call #[phone]) [facility]

## 2017-01-20 ENCOUNTER — Ambulatory Visit: Payer: PPO | Admitting: Anesthesiology

## 2017-01-27 ENCOUNTER — Ambulatory Visit (INDEPENDENT_AMBULATORY_CARE_PROVIDER_SITE_OTHER): Payer: PPO | Admitting: Family Medicine

## 2017-01-27 ENCOUNTER — Encounter: Payer: Self-pay | Admitting: Family Medicine

## 2017-01-27 VITALS — BP 120/60 | HR 78 | Temp 97.7°F | Resp 16 | Wt 172.0 lb

## 2017-01-27 DIAGNOSIS — I10 Essential (primary) hypertension: Secondary | ICD-10-CM

## 2017-01-27 DIAGNOSIS — R0989 Other specified symptoms and signs involving the circulatory and respiratory systems: Secondary | ICD-10-CM | POA: Diagnosis not present

## 2017-01-27 DIAGNOSIS — E118 Type 2 diabetes mellitus with unspecified complications: Secondary | ICD-10-CM | POA: Diagnosis not present

## 2017-01-27 DIAGNOSIS — E7800 Pure hypercholesterolemia, unspecified: Secondary | ICD-10-CM | POA: Diagnosis not present

## 2017-01-27 LAB — POCT GLYCOSYLATED HEMOGLOBIN (HGB A1C): HEMOGLOBIN A1C: 7.5

## 2017-01-27 NOTE — Progress Notes (Signed)
Subjective:  HPI  Diabetes Mellitus Type II, Follow-up:   Lab Results  Component Value Date   HGBA1C 7.8 (H) 09/13/2016   HGBA1C 7.5 05/08/2016   HGBA1C 7.4 01/09/2016    Last seen for diabetes 4 months ago.  Management since then includes none. She reports good compliance with treatment. She is not having side effects.  Current symptoms include nausea and have been improving. Home blood sugar records: 105-150  Episodes of hypoglycemia? no   Current Insulin Regimen: 30 units lantus Current exercise: none  Pertinent Labs:    Component Value Date/Time   CHOL 258 (H) 09/13/2016 0923   TRIG 230 (H) 09/13/2016 0923   HDL 52 09/13/2016 0923   LDLCALC 160 (H) 09/13/2016 0923   CREATININE 1.21 (H) 06/20/2016 1407    Wt Readings from Last 3 Encounters:  01/27/17 172 lb (78 kg)  01/17/17 165 lb (74.8 kg)  01/06/17 165 lb (74.8 kg)    ------------------------------------------------------------------------   Hypertension, follow-up:  BP Readings from Last 3 Encounters:  01/27/17 120/60  01/17/17 139/63  01/06/17 (!) 118/57    She was last seen for hypertension 4 months ago.  BP at that visit was 139/63. Management since that visit includes none. She reports good compliance with treatment. She is not having side effects.  She is not exercising. She is adherent to low salt diet.   Outside blood pressures are not checked. She is experiencing none.  Patient denies chest pain, chest pressure/discomfort, claudication, dyspnea, exertional chest pressure/discomfort, fatigue, irregular heart beat, lower extremity edema, near-syncope, orthopnea, palpitations, paroxysmal nocturnal dyspnea, syncope and tachypnea.   Cardiovascular risk factors include advanced age (older than 81 for men, 76 for women), diabetes mellitus and hypertension.  Wt Readings from Last 3 Encounters:  01/27/17 172 lb (78 kg)  01/17/17 165 lb (74.8 kg)  01/06/17 165 lb (74.8 kg)      ------------------------------------------------------------------------  Pt reports that over the last 2 weeks she has been nauseated. She says the last couple of days she has not been nauseated but her daughter wanted her to mention it. She says she has been a little dizzy when standing up, but goes away a little after standing. No abdominal pain or diarrhea.   She needs a handicapped renewal forme filled out.   Prior to Admission medications   Medication Sig Start Date End Date Taking? Authorizing Provider  ALPRAZolam Duanne Moron) 0.5 MG tablet TAKE ONE TABLET (0.5 MG TOTAL) BY MOUTH AT BEDTIME AS NEEDED 12/03/16   Jerrol Banana., MD  amLODipine (NORVASC) 5 MG tablet Take 1 tablet (5 mg total) by mouth daily. 02/19/16   Jerrol Banana., MD  aspirin 81 MG tablet Take 81 mg by mouth daily.    Historical Provider, MD  gabapentin (NEURONTIN) 100 MG capsule 200 mg 2 (two) times daily.  11/15/16   Historical Provider, MD  glucose blood (ONE TOUCH ULTRA TEST) test strip Check sugar twice daily. 02/19/16   Richard Maceo Pro., MD  HYDROcodone-acetaminophen (NORCO/VICODIN) 5-325 MG tablet Take 1 tablet by mouth 2 (two) times daily. Patient not taking: Reported on 01/17/2017 01/06/17 02/05/17  Molli Barrows, MD  Insulin Pen Needle 31G X 8 MM MISC Check sugar 2 times daily, DX E11.9 (needs ultra fine pen needles) 02/19/16   Jerrol Banana., MD  LANTUS SOLOSTAR 100 UNIT/ML Solostar Pen INJECT 28 UNITS SUBCUTANEOUSLY AT BEDTIME 09/16/16   Jerrol Banana., MD  lidocaine (LIDODERM) 5 %  Place 1 patch onto the skin daily. Remove & Discard patch within 12 hours or as directed by MD Patient not taking: Reported on 01/17/2017 01/01/17   Jerrol Banana., MD  Multiple Vitamins-Minerals (PRESERVISION AREDS) CAPS Take 2 capsules by mouth daily.     Historical Provider, MD  Omega-3 Fatty Acids (FISH OIL) 1000 MG CAPS Take 1,000 mg by mouth daily.     Historical Provider, MD  omeprazole  (PRILOSEC) 20 MG capsule Take 1 capsule by mouth as needed.     Historical Provider, MD  quinapril-hydrochlorothiazide (ACCURETIC) 20-12.5 MG tablet Take 1 tablet by mouth 2 (two) times daily. 02/19/16   Richard Maceo Pro., MD    Patient Active Problem List   Diagnosis Date Noted  . Anxiety 03/23/2015  . Arthritis 03/23/2015  . Cardiovascular disease 03/23/2015  . Urinary system disease 03/23/2015  . Malignant neoplasm of corpus uteri, except isthmus ([place]) 03/23/2015  . Essential (primary) hypertension 03/23/2015  . Acid reflux 03/23/2015  . Cardiac murmur 03/23/2015  . Hypercholesteremia 03/23/2015  . Adult hypothyroidism 03/23/2015  . Cannot sleep 03/23/2015  . Malaise and fatigue 03/23/2015  . Muscle ache 03/23/2015  . Arthritis, degenerative 03/23/2015  . Diabetes mellitus, type 2 ([place]) 03/23/2015    Past Medical History:  Diagnosis Date  . Arthritis   . Cataract    cataract removal bilaterally approx 20 years ago  . Diabetes mellitus without complication [facility])     Social History   Social History  . Marital status: Widowed    Spouse name: N/A  . Number of children: N/A  . Years of education: N/A   Occupational History  . Not on file.   Social History Main Topics  . Smoking status: Never Smoker  . Smokeless tobacco: Never Used  . Alcohol use No  . Drug use: No  . Sexual activity: No   Other Topics Concern  . Not on file   Social History Narrative  . No narrative on file    Allergies  Allergen Reactions  . Lodine [Etodolac] Nausea Only and Other (See Comments)    Dizziness, too  . Naproxen Other (See Comments)    Gave her an ulcer after taking it for years  . Statins Other (See Comments)    No energy and myalgias  . Lamisil [Terbinafine Hcl] Other (See Comments)    "Maybe made me nervous"  . Latex Rash    Review of Systems  Constitutional: Negative.   HENT: Negative.   Eyes: Negative.   Respiratory: Negative.   Cardiovascular: Negative.    Gastrointestinal: Positive for nausea.  Genitourinary: Negative.   Musculoskeletal: Positive for back pain.  Skin: Negative.   Neurological: Positive for dizziness.  Endo/Heme/Allergies: Negative.   Psychiatric/Behavioral: Negative.     Immunization History  Administered Date(s) Administered  . Influenza, High Dose Seasonal PF 09/04/2015, 08/29/2016  . Influenza-Unspecified 09/18/2013  . Pneumococcal Conjugate-13 12/27/2014  . Pneumococcal Polysaccharide-23 07/13/1998  . Td 03/29/2010    Objective:  BP 120/60 (BP Location: Left Arm, Patient Position: Sitting, Cuff Size: Normal)   Pulse 78   Temp 97.7 F (36.5 C) (Oral)   Resp 16   Wt 172 lb (78 kg)   BMI 29.52 kg/m   Physical Exam  Constitutional: She is oriented to person, place, and time and well-developed, well-nourished, and in no distress.  Eyes: Conjunctivae and EOM are normal. Pupils are equal, round, and reactive to light.  Neck: Normal range of motion. Neck supple.  Cardiovascular: Normal rate, regular rhythm and intact distal pulses.   Murmur (2/6 sysolic murmur. radiates up her carotiod) heard. Pulmonary/Chest: Breath sounds normal.  Musculoskeletal: Normal range of motion.  Neurological: She is alert and oriented to person, place, and time. She has normal reflexes. Gait normal. GCS score is 15.  Skin: Skin is warm and dry.  Psychiatric: Mood, memory, affect and judgment normal.    Lab Results  Component Value Date   WBC 6.5 06/20/2016   HGB 12.3 12/27/2013   HCT 37.9 06/20/2016   PLT CANCELED 06/20/2016   GLUCOSE 278 (H) 06/20/2016   CHOL 258 (H) 09/13/2016   TRIG 230 (H) 09/13/2016   HDL 52 09/13/2016   LDLCALC 160 (H) 09/13/2016   TSH 2.270 06/20/2016   HGBA1C 7.8 (H) 09/13/2016    CMP     Component Value Date/Time   NA 140 06/20/2016 1407   K 4.6 06/20/2016 1407   CL 100 06/20/2016 1407   CO2 21 06/20/2016 1407   GLUCOSE 278 (H) 06/20/2016 1407   GLUCOSE 50 (L) 05/21/2009 0653   BUN 31  (H) 06/20/2016 1407   CREATININE 1.21 (H) 06/20/2016 1407   CALCIUM 9.3 06/20/2016 1407   PROT 6.9 06/20/2016 1407   ALBUMIN 4.3 06/20/2016 1407   AST 30 06/20/2016 1407   ALT 24 06/20/2016 1407   ALKPHOS 124 (H) 06/20/2016 1407   BILITOT 0.4 06/20/2016 1407   GFRNONAA 40 (L) 06/20/2016 1407   GFRAA 46 (L) 06/20/2016 1407    Assessment and Plan :  1. Type 2 diabetes mellitus with complication, unspecified long term insulin use status (HCC)  - POCT HgB A1C 7.5 today. Stable.   2. Essential (primary) hypertension Stable. Will get labs next OV.   3. Hypercholesteremia   4. Bilateral carotid bruits New. Probably murmur radiation but will get doppler.  - US Carotid Bilateral; Future   HPI, Exam, and A&P Transcribed under the direction and in the presence of Richard L. Cranford Mon, MD  Electronically Signed: Katina Dung, CMA I have done the exam and reviewed the above chart and it is accurate to the best of my knowledge. Development worker, community has been used in this note in any air is in the dictation or transcription are unintentional.  [facility] 01/27/2017 11:11 AM

## 2017-02-03 ENCOUNTER — Ambulatory Visit
Admission: RE | Admit: 2017-02-03 | Discharge: 2017-02-03 | Disposition: A | Discharge status: Home / Self Care | Payer: PPO | Source: Ambulatory Visit | Attending: Family Medicine | Admitting: Family Medicine

## 2017-02-03 DIAGNOSIS — R0989 Other specified symptoms and signs involving the circulatory and respiratory systems: Secondary | ICD-10-CM | POA: Diagnosis not present

## 2017-02-03 DIAGNOSIS — I6523 Occlusion and stenosis of bilateral carotid arteries: Secondary | ICD-10-CM | POA: Insufficient documentation

## 2017-02-04 ENCOUNTER — Ambulatory Visit (HOSPITAL_BASED_OUTPATIENT_CLINIC_OR_DEPARTMENT_OTHER): Payer: PPO | Admitting: Anesthesiology

## 2017-02-04 ENCOUNTER — Telehealth: Payer: Self-pay

## 2017-02-04 ENCOUNTER — Other Ambulatory Visit: Payer: Self-pay | Admitting: Anesthesiology

## 2017-02-04 ENCOUNTER — Encounter: Payer: Self-pay | Admitting: Anesthesiology

## 2017-02-04 ENCOUNTER — Ambulatory Visit
Admission: RE | Admit: 2017-02-04 | Discharge: 2017-02-04 | Disposition: A | Discharge status: Home / Self Care | Payer: PPO | Source: Ambulatory Visit | Attending: Anesthesiology | Admitting: Anesthesiology

## 2017-02-04 VITALS — BP 154/54 | HR 66 | Temp 97.7°F | Resp 16 | Ht 63.5 in | Wt 160.0 lb

## 2017-02-04 DIAGNOSIS — R52 Pain, unspecified: Secondary | ICD-10-CM

## 2017-02-04 DIAGNOSIS — M961 Postlaminectomy syndrome, not elsewhere classified: Secondary | ICD-10-CM

## 2017-02-04 DIAGNOSIS — M4697 Unspecified inflammatory spondylopathy, lumbosacral region: Secondary | ICD-10-CM

## 2017-02-04 DIAGNOSIS — M5432 Sciatica, left side: Secondary | ICD-10-CM | POA: Diagnosis not present

## 2017-02-04 DIAGNOSIS — M47817 Spondylosis without myelopathy or radiculopathy, lumbosacral region: Secondary | ICD-10-CM

## 2017-02-04 MED ORDER — IOPAMIDOL (ISOVUE-M 200) INJECTION 41%
20.0000 mL | Freq: Once | INTRAMUSCULAR | Status: DC | PRN
  Administered 2017-02-04: 10 mL
  Filled 2017-02-04: qty 20

## 2017-02-04 MED ORDER — LACTATED RINGERS IV SOLN
1000.0000 mL | INTRAVENOUS | Status: DC
  Administered 2017-02-04: 1000 mL via INTRAVENOUS

## 2017-02-04 MED ORDER — SODIUM CHLORIDE 0.9% FLUSH
10.0000 mL | Freq: Once | INTRAVENOUS | Status: AC
  Administered 2017-02-04: 10 mL

## 2017-02-04 MED ORDER — IOPAMIDOL (ISOVUE-M 200) INJECTION 41%
INTRAMUSCULAR | Status: AC
  Filled 2017-02-04: qty 10

## 2017-02-04 MED ORDER — MIDAZOLAM HCL 5 MG/5ML IJ SOLN
5.0000 mg | Freq: Once | INTRAMUSCULAR | Status: AC
  Administered 2017-02-04: 1 mg via INTRAVENOUS
  Filled 2017-02-04: qty 5

## 2017-02-04 MED ORDER — LIDOCAINE HCL (PF) 1 % IJ SOLN
5.0000 mL | Freq: Once | INTRAMUSCULAR | Status: AC
  Administered 2017-02-04: 5 mL via SUBCUTANEOUS
  Filled 2017-02-04: qty 5

## 2017-02-04 MED ORDER — ROPIVACAINE HCL 5 MG/ML IJ SOLN
INTRAMUSCULAR | Status: AC
  Filled 2017-02-04: qty 20

## 2017-02-04 MED ORDER — ROPIVACAINE HCL 2 MG/ML IJ SOLN
10.0000 mL | Freq: Once | INTRAMUSCULAR | Status: AC
  Administered 2017-02-04: 10 mL via EPIDURAL
  Filled 2017-02-04: qty 10

## 2017-02-04 MED ORDER — SODIUM CHLORIDE 0.9 % IJ SOLN
INTRAMUSCULAR | Status: AC
  Filled 2017-02-04: qty 10

## 2017-02-04 MED ORDER — TRIAMCINOLONE ACETONIDE 40 MG/ML IJ SUSP
40.0000 mg | Freq: Once | INTRAMUSCULAR | Status: AC
  Administered 2017-02-04: 40 mg
  Filled 2017-02-04: qty 1

## 2017-02-04 NOTE — Patient Instructions (Signed)
Epidural Steroid Injection An epidural steroid injection is a shot of steroid medicine and numbing medicine that is given into the space between the spinal cord and the bones in your back (epidural space). The shot helps relieve pain caused by an irritated or swollen nerve root. The amount of pain relief you get from the injection depends on what is causing the nerve to be swollen and irritated, and how long your pain lasts. You are more likely to benefit from this injection if your pain is strong and comes on suddenly rather than if you have had pain for a long time. Tell a health care provider about:  Any allergies you have.  All medicines you are taking, including vitamins, herbs, eye drops, creams, and over-the-counter medicines.  Any problems you or family members have had with anesthetic medicines.  Any blood disorders you have.  Any surgeries you have had.  Any medical conditions you have.  Whether you are pregnant or may be pregnant. What are the risks? Generally, this is a safe procedure. However, problems may occur, including:  Headache.  Bleeding.  Infection.  Allergic reaction to medicines.  Damage to your nerves.  What happens before the procedure? Staying hydrated Follow instructions from your health care provider about hydration, which may include:  Up to 2 hours before the procedure - you may continue to drink clear liquids, such as water, clear fruit juice, black coffee, and plain tea.  Eating and drinking restrictions Follow instructions from your health care provider about eating and drinking, which may include:  8 hours before the procedure - stop eating heavy meals or foods such as meat, fried foods, or fatty foods.  6 hours before the procedure - stop eating light meals or foods, such as toast or cereal.  6 hours before the procedure - stop drinking milk or drinks that contain milk.  2 hours before the procedure - stop drinking clear  liquids.  Medicine  You may be given medicines to lower anxiety.  Ask your health care provider about: ? Changing or stopping your regular medicines. This is especially important if you are taking diabetes medicines or blood thinners. ? Taking medicines such as aspirin and ibuprofen. These medicines can thin your blood. Do not take these medicines before your procedure if your health care provider instructs you not to. General instructions  Plan to have someone take you home from the hospital or clinic. What happens during the procedure?  You may receive a medicine to help you relax (sedative).  You will be asked to lie on your abdomen.  The injection site will be cleaned.  A numbing medicine (local anesthetic) will be used to numb the injection site.  A needle will be inserted through your skin into the epidural space. You may feel some discomfort when this happens. An X-ray machine will be used to make sure the needle is put as close as possible to the affected nerve.  A steroid medicine and a local anesthetic will be injected into the epidural space.  The needle will be removed.  A bandage (dressing) will be put over the injection site. What happens after the procedure?  Your blood pressure, heart rate, breathing rate, and blood oxygen level will be monitored until the medicines you were given have worn off.  Your arm or leg may feel weak or numb for a few hours.  The injection site may feel sore.  Do not drive for 24 hours if you received a sedative. This   information is not intended to replace advice given to you by your health care provider. Make sure you discuss any questions you have with your health care provider. Document Released: 02/11/2008 Document Revised: 04/17/2016 Document Reviewed: 02/20/2016 [employer] Interactive Patient Education  2017 [employer] Pain Management Discharge Instructions  General Discharge Instructions :  If you need to reach your  doctor call: Monday-Friday 8:00 am - 4:00 pm at [phone] or toll free [phone].  After clinic hours [phone] to have operator reach doctor.  Bring all of your medication bottles to all your appointments in the pain clinic.  To cancel or reschedule your appointment with Pain Management please remember to call 24 hours in advance to avoid a fee.  Refer to the educational materials which you have been given on: General Risks, I had my Procedure. Discharge Instructions, Post Sedation.  Post Procedure Instructions:  The drugs you were given will stay in your system until tomorrow, so for the next 24 hours you should not drive, make any legal decisions or drink any alcoholic beverages.  You may eat anything you prefer, but it is better to start with liquids then soups and crackers, and gradually work up to solid foods.  Please notify your doctor immediately if you have any unusual bleeding, trouble breathing or pain that is not related to your normal pain.  Depending on the type of procedure that was done, some parts of your body may feel week and/or numb.  This usually clears up by tonight or the next day.  Walk with the use of an assistive device or accompanied by an adult for the 24 hours.  You may use ice on the affected area for the first 24 hours.  Put ice in a Ziploc bag and cover with a towel and place against area 15 minutes on 15 minutes off.  You may switch to heat after 24 hours. 

## 2017-02-04 NOTE — Progress Notes (Signed)
Subjective:  Patient ID: Jasmine Webster, female    DOB: 1926/05/22  Age: 83 y.o. MRN: [id number]  CC: Back Pain (lower left.)   Service Provided on Last Visit: Procedure PROCEDURE:Caudal epidural steroid No. 2 under fluoroscopic guidance with moderate sedation  HPI ADJA RUFF presents for Reevaluation. She states that she has done well following her last visit where she had a caudal epidural steroid injection. She has a 50% reduction in her low back pain and her left lower extremity pain is 25% improved. Otherwise she is in her usual state of health and desires to proceed with her second epidural today.   By history She is a pleasant 83 year old white female with a long-standing history of low back pain. She describes having had multiple procedures on her low back and neck and has undergone a low thoracic to lower lumbar spinal fusion. She was recently seen by Dr. Earle Gell and subsequently referred to [name] for her sciatica. She experiences this primarily in the left lower back with radiation into the left posterior leg and describes it as a maximum pain score of 6 that does not appear to be influenced by time of day but is worse with prolonged sitting. Alleviating factors include lying down and particular positions to decompress her back seems to help. She does not have any appreciable lower extremity weakness. She occasionally gets some cramping type pain especially in the left lower back with radiation into the buttocks and hip that is burning and constant. She cannot get any relief at this time and has been on medication management and has gone through conservative therapy without significant improvement. She also has had a previous CT myelogram and I reviewed the results of this today.  History Khianna has a past medical history of Arthritis; Cataract; and Diabetes mellitus without complication ([place]).   She has a past surgical history that includes Back surgery; Appendectomy; Abdominal  hysterectomy; Tonsillectomy and adenoidectomy; Cataract extraction; and Thoracolumbar syringo shunt.   Her family history includes Cancer in her brother and brother; Diabetes in her father; Heart disease in her mother and sister; Pneumonia in her father; Stroke in her brother, father, and sister; Sudden death in her brother, sister, and sister.She reports that she has never smoked. She has never used smokeless tobacco. She reports that she does not drink alcohol or use drugs.  No results found for this or any previous visit.  No results found for: TOXASSSELUR  Outpatient Medications Prior to Visit  Medication Sig Dispense Refill  . ALPRAZolam (XANAX) 0.5 MG tablet TAKE ONE TABLET (0.5 MG TOTAL) BY MOUTH AT BEDTIME AS NEEDED 30 tablet 5  . amLODipine (NORVASC) 5 MG tablet Take 1 tablet (5 mg total) by mouth daily. 90 tablet 3  . aspirin 81 MG tablet Take 81 mg by mouth daily.    Marland Kitchen gabapentin (NEURONTIN) 100 MG capsule 200 mg 2 (two) times daily.     Marland Kitchen glucose blood (ONE TOUCH ULTRA TEST) test strip Check sugar twice daily. 100 each 3  . Insulin Pen Needle 31G X 8 MM MISC Check sugar 2 times daily, DX E11.9 (needs ultra fine pen needles) 90 each 12  . LANTUS SOLOSTAR 100 UNIT/ML Solostar Pen INJECT 28 UNITS SUBCUTANEOUSLY AT BEDTIME 3 pen 3  . Multiple Vitamins-Minerals (PRESERVISION AREDS) CAPS Take 2 capsules by mouth daily.     . Omega-3 Fatty Acids (FISH OIL) 1000 MG CAPS Take 1,000 mg by mouth daily.     Marland Kitchen omeprazole (  PRILOSEC) 20 MG capsule Take 1 capsule by mouth as needed.     . quinapril-hydrochlorothiazide (ACCURETIC) 20-12.5 MG tablet Take 1 tablet by mouth 2 (two) times daily. 180 tablet 3  . HYDROcodone-acetaminophen (NORCO/VICODIN) 5-325 MG tablet Take 1 tablet by mouth 2 (two) times daily. (Patient not taking: Reported on 01/17/2017) 60 tablet 0  . lidocaine (LIDODERM) 5 % Place 1 patch onto the skin daily. Remove & Discard patch within 12 hours or as directed by MD (Patient not  taking: Reported on 01/17/2017) 60 patch 11   No facility-administered medications prior to visit.    Lab Results  Component Value Date   WBC 6.5 06/20/2016   HGB 12.3 12/27/2013   HCT 37.9 06/20/2016   PLT CANCELED 06/20/2016   GLUCOSE 278 (H) 06/20/2016   CHOL 258 (H) 09/13/2016   TRIG 230 (H) 09/13/2016   HDL 52 09/13/2016   LDLCALC 160 (H) 09/13/2016   ALT 24 06/20/2016   AST 30 06/20/2016   NA 140 06/20/2016   K 4.6 06/20/2016   CL 100 06/20/2016   CREATININE 1.21 (H) 06/20/2016   BUN 31 (H) 06/20/2016   CO2 21 06/20/2016   TSH 2.270 06/20/2016   HGBA1C 7.5 01/27/2017    --------------------------------------------------------------------------------------------------------------------- Ct Lumbar Spine W Contrast  Result Date: 12/18/2016 CLINICAL DATA:  Back pain EXAM: CT MYELOGRAPHY LUMBAR SPINE TECHNIQUE: CT imaging of the lumbar spine was performed after intrathecal contrast administration. Multiplanar CT image reconstructions were also generated. COMPARISON:  None. FINDINGS: There are bilateral pedicle screws in T12 through S1. There are cross stabilizing bars extending from the thoracic spine to the L4 pedicle screws. There are cross stabilizing bars extending from the L5-S1 pedicle screws. There are disc spacers present at every level except for L1-2 in above. There is solid fusion in the posterior elements from T11 through S1. The study extends from the mid T11 level. There is also solid fusion across the L2-3, L3-4, and L5-S1 discs. Fusion across the L4-5 disc has not yet occurred. Segmentation:  5 lumbar type vertebral bodies. Alignment: Normal. Vertebrae: There is no vertebral compression deformity. Conus medullaris: Extends to the L1-2 disc level and appears normal. Paraspinal and other soft tissues: Aortic atherosclerotic calcification. Disc levels: L1-2: Central canal is widely patent. L2-3:  Central canal is widely patent.  Posterior decompression. L3-4:  Central canal  is widely patent.  Posterior decompression. L4-5: Posterior decompression. Central canal is widely patent. Disc osteophytes to encroach upon the foramina bilaterally. This results in mild foraminal narrowing. L5-S1: Central canal is widely patent. Disc osteophytes mildly encroach upon the foramina bilaterally. No obvious narrowing. Posterior decompression. IMPRESSION: Thoracolumbar fusion is solid as described above. This includes solid fusion across the posterior elements at L4-5 without solid fusion across the disc space. I suspect that the apparent abnormal motion noted on plain radiographs was artifactual and fusion is solid and intact. There is mild foraminal narrowing at L4-5 secondary to disc osteophytes. Electronically Signed   By: Marybelle Killings M.D.   On: 12/18/2016 15:15   Dg Myelography Lumbar Inj Lumbosacral  Result Date: 12/18/2016 CLINICAL DATA:  Back pain.  Lumbar fusion. EXAM: LUMBAR MYELOGRAM FLUOROSCOPY TIME:  37 seconds.  15.6 mGy. PROCEDURE: After thorough discussion of risks and benefits of the procedure including bleeding, infection, injury to nerves, blood vessels, adjacent structures as well as headache and CSF leak, written and oral informed consent was obtained. Consent was obtained by Dr. Marybelle Killings. Time out form was completed. Patient was  positioned prone on the fluoroscopy table. Local anesthesia was provided with 1% lidocaine without epinephrine after prepped and draped in the usual sterile fashion. Puncture was performed at L3-4 using a 3 1/2 inch 22-gauge spinal needle via a midline approach. Using a single pass through the dura, the needle was placed within the thecal sac, with return of clear CSF. 15 mL of Omnipaque-180 was injected into the thecal sac, with normal opacification of the nerve roots and cauda equina consistent with free flow within the subarachnoid space. I personally performed the lumbar puncture and administered the intrathecal contrast. I also personally  supervised acquisition of the myelogram images. TECHNIQUE: Contiguous axial images were obtained through the Lumbar spine after the intrathecal infusion of infusion. Coronal and sagittal reconstructions were obtained of the axial image sets. COMPARISON:  None FINDINGS: There are bilateral pedicle screw from L1 through S1 as well as in the lower thoracic spine. Cross stabilizing bars extend from the thoracic spine to the L4 pedicle screws. There are also L5-S1 pedicle screws with cross stabilizing bars. There is no breakage or loosening of the hardware. There is anatomic alignment of the vertebral bodies. There is no vertebral compression deformity. No obvious spinal stenosis is present. A disc spacer is present at L4-5. There is some motion in the disc between flexion and extension views. IMPRESSION: Extensive thoracolumbosacral fusion as described. There is noted to be some degree of motion at the L4-5 level. A disc spacer is present at this level but there is no posterior stabilization. Electronically Signed   By: Marybelle Killings M.D.   On: 12/18/2016 14:38       ---------------------------------------------------------------------------------------------------------------------- Past Medical History:  Diagnosis Date  . Arthritis   . Cataract    cataract removal bilaterally approx 20 years ago  . Diabetes mellitus without complication [facility])     Past Surgical History:  Procedure Laterality Date  . ABDOMINAL HYSTERECTOMY    . APPENDECTOMY    . BACK SURGERY     three  . CATARACT EXTRACTION    . THORACOLUMBAR SYRINGO SHUNT    . TONSILLECTOMY AND ADENOIDECTOMY      Family History  Problem Relation Age of Onset  . Heart disease Mother     died from MI  . Heart disease Sister   . Sudden death Brother     shot and beaten to death during home invasion  . Stroke Sister   . Sudden death Sister     MVA  . Cancer Brother     died from lung cancer  . Sudden death Sister     39 days old  .  Cancer Brother     died from lung cancer  . Stroke Brother     cause of death  . Diabetes Father   . Stroke Father   . Pneumonia Father     Social History  Substance Use Topics  . Smoking status: Never Smoker  . Smokeless tobacco: Never Used  . Alcohol use No    ---------------------------------------------------------------------------------------------------------------------  Scheduled Meds: Continuous Infusions: PRN Meds:.   BP (!) 154/54   Pulse 66   Temp 97.7 F (36.5 C) (Oral)   Resp 16   Ht 5' 3.5" (1.613 m)   Wt 160 lb (72.6 kg)   SpO2 100%   BMI 27.90 kg/m    BP Readings from Last 3 Encounters:  02/04/17 (!) 154/54  01/27/17 120/60  01/17/17 139/63     Wt Readings from Last 3 Encounters:  02/04/17  160 lb (72.6 kg)  01/27/17 172 lb (78 kg)  01/17/17 165 lb (74.8 kg)     ----------------------------------------------------------------------------------------------------------------------  ROS Review of Systems  Cardiac: No angina takes a daily aspirin with a history of a murmur GI: History of ulcers but no hematemesis or hematochezia The remainder of review of systems was negativeAnd without change  Objective:  BP (!) 154/54   Pulse 66   Temp 97.7 F (36.5 C) (Oral)   Resp 16   Ht 5' 3.5" (1.613 m)   Wt 160 lb (72.6 kg)   SpO2 100%   BMI 27.90 kg/m   Physical Exam Pupils are equally round reactive to light extraocular muscles are intact and the patient is a good historian Heart reveals a 2/6 systolic ejection murmur with regular rhythm and rate Lungs are clear to auscultation without wheeze     Assessment & Plan:   Nanci was seen today for back pain.  Diagnoses and all orders for this visit:  Failed back surgical syndrome -     Lumbar Epidural Injection -     triamcinolone acetonide (KENALOG-40) injection 40 mg; 1 mL (40 mg total) by Other route once. -     sodium chloride flush (NS) 0.9 % injection 10 mL; 10 mLs by Other  route once. -     ropivacaine (PF) 2 mg/mL (0.2%) (NAROPIN) injection 10 mL; 10 mLs by Epidural route once. -     midazolam (VERSED) 5 MG/5ML injection 5 mg; Inject 5 mLs (5 mg total) into the vein once. -     lidocaine (PF) (XYLOCAINE) 1 % injection 5 mL; Inject 5 mLs into the skin once. -     lactated ringers infusion 1,000 mL; Inject 1,000 mLs into the vein continuous. -     iopamidol (ISOVUE-M) 41 % intrathecal injection 20 mL; 20 mLs by Other route once as needed for contrast.  Facet arthritis of lumbosacral region (HCC) -     LUMBAR FACET(MEDIAL BRANCH NERVE BLOCK) MBNB  Sciatica of left side -     Lumbar Epidural Injection -     triamcinolone acetonide (KENALOG-40) injection 40 mg; 1 mL (40 mg total) by Other route once. -     sodium chloride flush (NS) 0.9 % injection 10 mL; 10 mLs by Other route once. -     ropivacaine (PF) 2 mg/mL (0.2%) (NAROPIN) injection 10 mL; 10 mLs by Epidural route once. -     midazolam (VERSED) 5 MG/5ML injection 5 mg; Inject 5 mLs (5 mg total) into the vein once. -     lidocaine (PF) (XYLOCAINE) 1 % injection 5 mL; Inject 5 mLs into the skin once. -     lactated ringers infusion 1,000 mL; Inject 1,000 mLs into the vein continuous. -     iopamidol (ISOVUE-M) 41 % intrathecal injection 20 mL; 20 mLs by Other route once as needed for contrast.     ----------------------------------------------------------------------------------------------------------------------  Problem List Items Addressed This Visit    None    Visit Diagnoses    Failed back surgical syndrome    -  Primary   Relevant Medications   triamcinolone acetonide (KENALOG-40) injection 40 mg (Completed)   sodium chloride flush (NS) 0.9 % injection 10 mL (Completed)   ropivacaine (PF) 2 mg/mL (0.2%) (NAROPIN) injection 10 mL (Completed)   midazolam (VERSED) 5 MG/5ML injection 5 mg (Completed)   lidocaine (PF) (XYLOCAINE) 1 % injection 5 mL (Completed)   lactated ringers infusion 1,000 mL  iopamidol (ISOVUE-M) 41 % intrathecal injection 20 mL   Other Relevant Orders   Lumbar Epidural Injection   Facet arthritis of lumbosacral region (HCC)       Relevant Medications   triamcinolone acetonide (KENALOG-40) injection 40 mg (Completed)   Sciatica of left side       Relevant Medications   triamcinolone acetonide (KENALOG-40) injection 40 mg (Completed)   sodium chloride flush (NS) 0.9 % injection 10 mL (Completed)   ropivacaine (PF) 2 mg/mL (0.2%) (NAROPIN) injection 10 mL (Completed)   midazolam (VERSED) 5 MG/5ML injection 5 mg (Completed)   lidocaine (PF) (XYLOCAINE) 1 % injection 5 mL (Completed)   lactated ringers infusion 1,000 mL   iopamidol (ISOVUE-M) 41 % intrathecal injection 20 mL   Other Relevant Orders   Lumbar Epidural Injection      ----------------------------------------------------------------------------------------------------------------------  1. Failed back surgical syndrome We will proceed with her second epidural with caudal approach today with return to clinic in approximately 1 or 2 months for a third injection at that time. She is to continue with her stretching exercises as reviewed with mild intensity and ambulation as tolerated.  2. Facet arthritis of lumbosacral region ([place])  3. Sciatica of left side As above - Lumbar Epidural Injection; Future  4. Low back pain at multiple sites She may restart her Vicodin and use it periodically if necessary for pain treatment as previously discussed.   ----------------------------------------------------------------------------------------------------------------------  I am having Ms. Baillargeon maintain her omeprazole, glucose blood, Insulin Pen Needle, quinapril-hydrochlorothiazide, amLODipine, LANTUS SOLOSTAR, gabapentin, PRESERVISION AREDS, aspirin, ALPRAZolam, Fish Oil, lidocaine, and HYDROcodone-acetaminophen. We administered triamcinolone acetonide, sodium chloride flush, ropivacaine (PF) 2 mg/mL  (0.2%), midazolam, lidocaine (PF), lactated ringers, and iopamidol.   Meds ordered this encounter  Medications  . triamcinolone acetonide (KENALOG-40) injection 40 mg  . sodium chloride flush (NS) 0.9 % injection 10 mL  . ropivacaine (PF) 2 mg/mL (0.2%) (NAROPIN) injection 10 mL  . midazolam (VERSED) 5 MG/5ML injection 5 mg  . lidocaine (PF) (XYLOCAINE) 1 % injection 5 mL  . lactated ringers infusion 1,000 mL  . iopamidol (ISOVUE-M) 41 % intrathecal injection 20 mL    Procedure: Total epidural steroid No. 2 under fluoroscopic guidance with moderate sedation  After informed consent was obtained and the risks benefits reviewed patient chose to pursue a caudal epidural steroid injection today. With the patient in the prone position and an IV in place, sedation was with IV 1 mg versed. Using AP and lateral fluoroscopic guidance I identified the area overlying the sacral hiatus. This area was broadly prepped with DuraPrep 3 and we utilized strict aseptic technique during the procedure. 1% lidocaine was infiltrated 2 cc using a 25-gauge needle overlying the sacral cornu and then using lateral fluoroscopic guidance I advanced an 18-gauge Touhy needle approximately 2 cm through the sacral hiatus. Confirmation was with 2 cc of Isovue yielding good epidural spread and no evidence of IV or subarachnoid uptake. This was followed by an injection of 5 cc of saline mixed with 1 cc of ropivacaine 0.2% and 40 mg of triamcinolone. This was tolerated without difficulty the patient was convalesced and discharged home in stable condition for follow-up as mentioned. JA  Follow-up: Return for evaluation, procedure.    Molli Barrows, MD 6:25 PM  The [place] practitioner database for opioid medications on this patient has been reviewed by me and my staff   Greater than 50% of the total encounter time was spent in counseling and / or coordination of  care.     This dictation was performed utilizing Dragon voice  recognition software.  Please excuse any unintentional or mistaken typographical errors as a result.

## 2017-02-04 NOTE — Telephone Encounter (Signed)
-----   Message from Jerrol Banana., MD sent at 02/03/2017  4:00 PM EDT ----- No significant blockage.

## 2017-02-04 NOTE — Telephone Encounter (Signed)
LMTCB  Thanks,  -Edmond Ginsberg 

## 2017-02-04 NOTE — Progress Notes (Signed)
Safety precautions to be maintained throughout the outpatient stay will include: orient to surroundings, keep bed in low position, maintain call bell within reach at all times, provide assistance with transfer out of bed and ambulation.  

## 2017-02-04 NOTE — Telephone Encounter (Signed)
Pt advised. Emily Drozdowski, CMA  

## 2017-02-05 ENCOUNTER — Telehealth: Payer: Self-pay | Admitting: *Deleted

## 2017-02-05 NOTE — Telephone Encounter (Signed)
Spoke with patient post procedure. Verbalizes no questions or concerns.

## 2017-02-17 ENCOUNTER — Ambulatory Visit (INDEPENDENT_AMBULATORY_CARE_PROVIDER_SITE_OTHER): Payer: PPO | Admitting: Family Medicine

## 2017-02-17 ENCOUNTER — Encounter: Payer: Self-pay | Admitting: Family Medicine

## 2017-02-17 VITALS — BP 134/62 | HR 74 | Temp 97.8°F | Resp 16 | Wt 170.0 lb

## 2017-02-17 DIAGNOSIS — E7800 Pure hypercholesterolemia, unspecified: Secondary | ICD-10-CM

## 2017-02-17 DIAGNOSIS — K219 Gastro-esophageal reflux disease without esophagitis: Secondary | ICD-10-CM | POA: Diagnosis not present

## 2017-02-17 DIAGNOSIS — R5383 Other fatigue: Secondary | ICD-10-CM

## 2017-02-17 DIAGNOSIS — R110 Nausea: Secondary | ICD-10-CM | POA: Diagnosis not present

## 2017-02-17 MED ORDER — RANITIDINE HCL 150 MG PO TABS: 150.0000 mg | ORAL_TABLET | Freq: Two times a day (BID) | ORAL | 12 refills | Status: DC

## 2017-02-17 MED ORDER — SUCRALFATE 1 G PO TABS: 1.0000 g | ORAL_TABLET | Freq: Three times a day (TID) | ORAL | 12 refills | Status: DC

## 2017-02-17 NOTE — Progress Notes (Signed)
Subjective:  HPI Pt is here today for nausea and leg cramps. She reports that she has been nauseated for about 2 months. She reports that she stopped taking gabapentin because she thought that may be the cause but she has been off it for about a week now and is still nauseated. She reports that most of the time she notices it in the mornings when she gets up but this morning it was after she ate breakfast which was cereal. She has not vomited and denies any abdominal pain or urinary symptoms. She has also been having leg cramps and cramps in her feet and thighs. She reports that she has had leg cramps for a while but they have gotten much worse lately. She is not taking any statins. She also reports that she feels tired and does not want anything but sweets. Her last a1c was 7.5 3 weeks ago and she reports that her blood sugars have been running under 140 but this morning it was 104 because she did not feel like eating yesterday.   Prior to Admission medications   Medication Sig Start Date End Date Taking? Authorizing Provider  ALPRAZolam Duanne Moron) 0.5 MG tablet TAKE ONE TABLET (0.5 MG TOTAL) BY MOUTH AT BEDTIME AS NEEDED 12/03/16   Jerrol Banana., MD  amLODipine (NORVASC) 5 MG tablet Take 1 tablet (5 mg total) by mouth daily. 02/19/16   Jerrol Banana., MD  aspirin 81 MG tablet Take 81 mg by mouth daily.    Historical Provider, MD  gabapentin (NEURONTIN) 100 MG capsule 200 mg 2 (two) times daily.  11/15/16   Historical Provider, MD  glucose blood (ONE TOUCH ULTRA TEST) test strip Check sugar twice daily. 02/19/16   Richard Maceo Pro., MD  Insulin Pen Needle 31G X 8 MM MISC Check sugar 2 times daily, DX E11.9 (needs ultra fine pen needles) 02/19/16   Jerrol Banana., MD  LANTUS SOLOSTAR 100 UNIT/ML Solostar Pen INJECT 28 UNITS SUBCUTANEOUSLY AT BEDTIME 09/16/16   Jerrol Banana., MD  lidocaine (LIDODERM) 5 % Place 1 patch onto the skin daily. Remove & Discard patch within 12  hours or as directed by MD Patient not taking: Reported on 01/17/2017 01/01/17   Jerrol Banana., MD  Multiple Vitamins-Minerals (PRESERVISION AREDS) CAPS Take 2 capsules by mouth daily.     Historical Provider, MD  Omega-3 Fatty Acids (FISH OIL) 1000 MG CAPS Take 1,000 mg by mouth daily.     Historical Provider, MD  omeprazole (PRILOSEC) 20 MG capsule Take 1 capsule by mouth as needed.     Historical Provider, MD  quinapril-hydrochlorothiazide (ACCURETIC) 20-12.5 MG tablet Take 1 tablet by mouth 2 (two) times daily. 02/19/16   Richard Maceo Pro., MD    Patient Active Problem List   Diagnosis Date Noted  . Anxiety 03/23/2015  . Arthritis 03/23/2015  . Cardiovascular disease 03/23/2015  . Urinary system disease 03/23/2015  . Malignant neoplasm of corpus uteri, except isthmus ([place]) 03/23/2015  . Essential (primary) hypertension 03/23/2015  . Acid reflux 03/23/2015  . Cardiac murmur 03/23/2015  . Hypercholesteremia 03/23/2015  . Adult hypothyroidism 03/23/2015  . Cannot sleep 03/23/2015  . Malaise and fatigue 03/23/2015  . Muscle ache 03/23/2015  . Arthritis, degenerative 03/23/2015  . Diabetes mellitus, type 2 ([place]) 03/23/2015    Past Medical History:  Diagnosis Date  . Arthritis   . Cataract    cataract removal bilaterally approx 20 years ago  .  Diabetes mellitus without complication [facility])     Social History   Social History  . Marital status: Widowed    Spouse name: N/A  . Number of children: N/A  . Years of education: N/A   Occupational History  . Not on file.   Social History Main Topics  . Smoking status: Never Smoker  . Smokeless tobacco: Never Used  . Alcohol use No  . Drug use: No  . Sexual activity: No   Other Topics Concern  . Not on file   Social History Narrative  . No narrative on file    Allergies  Allergen Reactions  . Lodine [Etodolac] Nausea Only and Other (See Comments)    Dizziness, too  . Naproxen Other (See Comments)    Gave her  an ulcer after taking it for years  . Statins Other (See Comments)    No energy and myalgias  . Lamisil [Terbinafine Hcl] Other (See Comments)    "Maybe made me nervous"  . Latex Rash    Review of Systems  Constitutional: Positive for malaise/fatigue.  HENT: Negative.   Eyes: Negative.   Respiratory: Negative.   Cardiovascular: Negative.   Gastrointestinal: Positive for nausea.  Genitourinary: Negative.   Musculoskeletal: Positive for myalgias (cramps).  Neurological: Negative.   Endo/Heme/Allergies: Negative.   Psychiatric/Behavioral: Negative.     Immunization History  Administered Date(s) Administered  . Influenza, High Dose Seasonal PF 09/04/2015, 08/29/2016  . Influenza-Unspecified 09/18/2013  . Pneumococcal Conjugate-13 12/27/2014  . Pneumococcal Polysaccharide-23 07/13/1998  . Td 03/29/2010    Objective:  BP 134/62 (BP Location: Left Arm, Patient Position: Sitting, Cuff Size: Normal)   Pulse 74   Temp 97.8 F (36.6 C) (Oral)   Resp 16   Wt 170 lb (77.1 kg)   BMI 29.64 kg/m   Physical Exam  Constitutional: She is oriented to person, place, and time and well-developed, well-nourished, and in no distress.  HENT:  Head: Normocephalic and atraumatic.  Right Ear: External ear normal.  Left Ear: External ear normal.  Nose: Nose normal.  Eyes: Conjunctivae are normal. No scleral icterus.  Neck: Neck supple. No thyromegaly present.  Cardiovascular: Normal rate, regular rhythm and normal heart sounds.   Pulmonary/Chest: Effort normal and breath sounds normal.  Abdominal: Soft.  Neurological: She is alert and oriented to person, place, and time. Gait normal. GCS score is 15.  Skin: Skin is warm and dry.  Psychiatric: Mood, memory, affect and judgment normal.    Lab Results  Component Value Date   WBC 6.5 06/20/2016   HGB 12.3 12/27/2013   HCT 37.9 06/20/2016   PLT CANCELED 06/20/2016   GLUCOSE 278 (H) 06/20/2016   CHOL 258 (H) 09/13/2016   TRIG 230 (H)  09/13/2016   HDL 52 09/13/2016   LDLCALC 160 (H) 09/13/2016   TSH 2.270 06/20/2016   HGBA1C 7.5 01/27/2017    CMP     Component Value Date/Time   NA 140 06/20/2016 1407   K 4.6 06/20/2016 1407   CL 100 06/20/2016 1407   CO2 21 06/20/2016 1407   GLUCOSE 278 (H) 06/20/2016 1407   GLUCOSE 50 (L) 05/21/2009 0653   BUN 31 (H) 06/20/2016 1407   CREATININE 1.21 (H) 06/20/2016 1407   CALCIUM 9.3 06/20/2016 1407   PROT 6.9 06/20/2016 1407   ALBUMIN 4.3 06/20/2016 1407   AST 30 06/20/2016 1407   ALT 24 06/20/2016 1407   ALKPHOS 124 (H) 06/20/2016 1407   BILITOT 0.4 06/20/2016 1407  GFRNONAA 40 (L) 06/20/2016 1407   GFRAA 46 (L) 06/20/2016 1407    Assessment and Plan :  1. Other fatigue  - EKG 12-Lead - CBC with Differential/Platelet - CBC with Differential/Platelet - Comprehensive metabolic panel - TSH  2. Nausea  - Comprehensive metabolic panel - Lipase - H. pylori antibody, IgG - H. pylori antibody, IgG - Lipase - ranitidine (ZANTAC) 150 MG tablet; Take 1 tablet (150 mg total) by mouth 2 (two) times daily.  Dispense: 60 tablet; Refill: 12 - sucralfate (CARAFATE) 1 g tablet; Take 1 tablet (1 g total) by mouth 4 (four) times daily -  with meals and at bedtime.  Dispense: 120 tablet; Refill: 12  3. Gastroesophageal reflux disease, esophagitis presence not specified Stop Omeprazole. - ranitidine (ZANTAC) 150 MG tablet; Take 1 tablet (150 mg total) by mouth 2 (two) times daily.  Dispense: 60 tablet; Refill: 12 - sucralfate (CARAFATE) 1 g tablet; Take 1 tablet (1 g total) by mouth 4 (four) times daily -  with meals and at bedtime.  Dispense: 120 tablet; Refill: 12  4. Hypercholesteremia  - Lipid Panel With LDL/HDL Ratio 5.OA 6.DDD I have done the exam and reviewed the above chart and it is accurate to the best of my knowledge. Development worker, community has been used in this note in any air is in the dictation or transcription are unintentional.  [facility] 02/17/2017 3:52 PM

## 2017-02-18 DIAGNOSIS — R5383 Other fatigue: Secondary | ICD-10-CM | POA: Diagnosis not present

## 2017-02-18 DIAGNOSIS — R110 Nausea: Secondary | ICD-10-CM | POA: Diagnosis not present

## 2017-02-18 DIAGNOSIS — E7800 Pure hypercholesterolemia, unspecified: Secondary | ICD-10-CM | POA: Diagnosis not present

## 2017-02-19 LAB — COMPREHENSIVE METABOLIC PANEL
ALK PHOS: 112 IU/L (ref 39–117)
ALT: 20 IU/L (ref 0–32)
AST: 21 IU/L (ref 0–40)
Albumin/Globulin Ratio: 1.8 (ref 1.2–2.2)
Albumin: 4.2 g/dL (ref 3.5–4.7)
BUN/Creatinine Ratio: 21 (ref 12–28)
BUN: 26 mg/dL (ref 8–27)
Bilirubin Total: 0.4 mg/dL (ref 0.0–1.2)
CALCIUM: 9.3 mg/dL (ref 8.7–10.3)
CO2: 21 mmol/L (ref 18–29)
Chloride: 96 mmol/L (ref 96–106)
Creatinine, Ser: 1.23 mg/dL — ABNORMAL HIGH (ref 0.57–1.00)
GFR calc Af Amer: 45 mL/min/{1.73_m2} — ABNORMAL LOW (ref >59)
GFR calc non Af Amer: 39 mL/min/{1.73_m2} — ABNORMAL LOW (ref >59)
GLOBULIN, TOTAL: 2.3 g/dL (ref 1.5–4.5)
GLUCOSE: 97 mg/dL (ref 65–99)
Potassium: 4.7 mmol/L (ref 3.5–5.2)
SODIUM: 135 mmol/L (ref 134–144)
Total Protein: 6.5 g/dL (ref 6.0–8.5)

## 2017-02-19 LAB — LIPID PANEL WITH LDL/HDL RATIO
CHOLESTEROL TOTAL: 300 mg/dL — AB (ref 100–199)
HDL: 62 mg/dL (ref >39)
LDL Calculated: 204 mg/dL — ABNORMAL HIGH (ref 0–99)
LDl/HDL Ratio: 3.3 ratio — ABNORMAL HIGH (ref 0.0–3.2)
Triglycerides: 172 mg/dL — ABNORMAL HIGH (ref 0–149)
VLDL Cholesterol Cal: 34 mg/dL (ref 5–40)

## 2017-02-19 LAB — CBC WITH DIFFERENTIAL/PLATELET
BASOS ABS: 0 10*3/uL (ref 0.0–0.2)
Basos: 0 %
EOS (ABSOLUTE): 0 10*3/uL (ref 0.0–0.4)
Eos: 0 %
HEMATOCRIT: 40.5 % (ref 34.0–46.6)
Hemoglobin: 13.5 g/dL (ref 11.1–15.9)
Immature Grans (Abs): 0 10*3/uL (ref 0.0–0.1)
Immature Granulocytes: 0 %
LYMPHS ABS: 3.7 10*3/uL — AB (ref 0.7–3.1)
Lymphs: 50 %
MCH: 31.6 pg (ref 26.6–33.0)
MCHC: 33.3 g/dL (ref 31.5–35.7)
MCV: 95 fL (ref 79–97)
MONOS ABS: 0.9 10*3/uL (ref 0.1–0.9)
Monocytes: 11 %
NEUTROS PCT: 39 %
Neutrophils Absolute: 2.9 10*3/uL (ref 1.4–7.0)
PLATELETS: 139 10*3/uL — AB (ref 150–379)
RBC: 4.27 x10E6/uL (ref 3.77–5.28)
RDW: 14.3 % (ref 12.3–15.4)
WBC: 7.6 10*3/uL (ref 3.4–10.8)

## 2017-02-19 LAB — TSH: TSH: 5.33 u[IU]/mL — AB (ref 0.450–4.500)

## 2017-02-19 LAB — H. PYLORI ANTIBODY, IGG

## 2017-02-19 LAB — LIPASE: LIPASE: 73 U/L (ref 14–85)

## 2017-02-24 ENCOUNTER — Other Ambulatory Visit: Payer: Self-pay | Admitting: Family Medicine

## 2017-02-24 ENCOUNTER — Telehealth: Payer: Self-pay

## 2017-02-24 NOTE — Telephone Encounter (Signed)
Patients labs stable.  TSH slightly off.  Will repeat on next visit.  ED  [facility]  ED

## 2017-02-24 NOTE — Progress Notes (Signed)
Advised  ED 

## 2017-02-28 ENCOUNTER — Other Ambulatory Visit: Payer: Self-pay | Admitting: Emergency Medicine

## 2017-02-28 DIAGNOSIS — E118 Type 2 diabetes mellitus with unspecified complications: Secondary | ICD-10-CM

## 2017-02-28 MED ORDER — GLUCOSE BLOOD VI STRP: ORAL_STRIP | 3 refills | Status: DC

## 2017-03-03 ENCOUNTER — Ambulatory Visit (INDEPENDENT_AMBULATORY_CARE_PROVIDER_SITE_OTHER): Payer: PPO | Admitting: Family Medicine

## 2017-03-03 VITALS — BP 120/54 | HR 76 | Temp 97.8°F | Resp 16 | Wt 172.0 lb

## 2017-03-03 DIAGNOSIS — Z794 Long term (current) use of insulin: Secondary | ICD-10-CM | POA: Diagnosis not present

## 2017-03-03 DIAGNOSIS — K219 Gastro-esophageal reflux disease without esophagitis: Secondary | ICD-10-CM

## 2017-03-03 DIAGNOSIS — R5383 Other fatigue: Secondary | ICD-10-CM | POA: Diagnosis not present

## 2017-03-03 DIAGNOSIS — E119 Type 2 diabetes mellitus without complications: Secondary | ICD-10-CM | POA: Diagnosis not present

## 2017-03-03 DIAGNOSIS — R110 Nausea: Secondary | ICD-10-CM | POA: Diagnosis not present

## 2017-03-03 NOTE — Progress Notes (Signed)
Subjective:  HPI Pt is here for a 2 week follow up on fatigue and nausea. She was started on Sucralfate and Ranitidine. She reports that she is feeling better. She reports that she has only been nauseated twice since she was here last. She is a little tired today but she has been washing clothes today. She reports that she has gotten some of her energy back. Last OV we checked labs and her TSH was off. It was noted to recheck that today.   Prior to Admission medications   Medication Sig Start Date End Date Taking? Authorizing Provider  ALPRAZolam Duanne Moron) 0.5 MG tablet TAKE ONE TABLET (0.5 MG TOTAL) BY MOUTH AT BEDTIME AS NEEDED 12/03/16   Jerrol Banana., MD  amLODipine (NORVASC) 5 MG tablet TAKE 1 TABLET BY MOUTH EVERY DAY 02/25/17   Jerrol Banana., MD  aspirin 81 MG tablet Take 81 mg by mouth daily.    Historical Provider, MD  gabapentin (NEURONTIN) 100 MG capsule 200 mg 2 (two) times daily.  11/15/16   Historical Provider, MD  glucose blood (ONE TOUCH ULTRA TEST) test strip Check sugar twice daily. 02/28/17   Richard Maceo Pro., MD  Insulin Pen Needle 31G X 8 MM MISC Check sugar 2 times daily, DX E11.9 (needs ultra fine pen needles) 02/19/16   Jerrol Banana., MD  LANTUS SOLOSTAR 100 UNIT/ML Solostar Pen INJECT 28 UNITS SUBCUTANEOUSLY AT BEDTIME 09/16/16   Jerrol Banana., MD  lidocaine (LIDODERM) 5 % Place 1 patch onto the skin daily. Remove & Discard patch within 12 hours or as directed by MD Patient not taking: Reported on 01/17/2017 01/01/17   Jerrol Banana., MD  Multiple Vitamins-Minerals (PRESERVISION AREDS) CAPS Take 2 capsules by mouth daily.     Historical Provider, MD  Omega-3 Fatty Acids (FISH OIL) 1000 MG CAPS Take 1,000 mg by mouth daily.     Historical Provider, MD  quinapril-hydrochlorothiazide (ACCURETIC) 20-12.5 MG tablet TAKE 1 TABLET BY MOUTH TWICE A DAY 02/25/17   Richard Maceo Pro., MD  ranitidine (ZANTAC) 150 MG tablet Take 1 tablet (150  mg total) by mouth 2 (two) times daily. 02/17/17   Richard Maceo Pro., MD  sucralfate (CARAFATE) 1 g tablet Take 1 tablet (1 g total) by mouth 4 (four) times daily -  with meals and at bedtime. 02/17/17   Richard Maceo Pro., MD    Patient Active Problem List   Diagnosis Date Noted  . Anxiety 03/23/2015  . Arthritis 03/23/2015  . Cardiovascular disease 03/23/2015  . Urinary system disease 03/23/2015  . Malignant neoplasm of corpus uteri, except isthmus ([place]) 03/23/2015  . Essential (primary) hypertension 03/23/2015  . Acid reflux 03/23/2015  . Cardiac murmur 03/23/2015  . Hypercholesteremia 03/23/2015  . Adult hypothyroidism 03/23/2015  . Cannot sleep 03/23/2015  . Malaise and fatigue 03/23/2015  . Muscle ache 03/23/2015  . Arthritis, degenerative 03/23/2015  . Diabetes mellitus, type 2 ([place]) 03/23/2015    Past Medical History:  Diagnosis Date  . Arthritis   . Cataract    cataract removal bilaterally approx 20 years ago  . Diabetes mellitus without complication [facility])     Social History   Social History  . Marital status: Widowed    Spouse name: N/A  . Number of children: N/A  . Years of education: N/A   Occupational History  . Not on file.   Social History Main Topics  . Smoking status:  Never Smoker  . Smokeless tobacco: Never Used  . Alcohol use No  . Drug use: No  . Sexual activity: No   Other Topics Concern  . Not on file   Social History Narrative  . No narrative on file    Allergies  Allergen Reactions  . Lodine [Etodolac] Nausea Only and Other (See Comments)    Dizziness, too  . Naproxen Other (See Comments)    Gave her an ulcer after taking it for years  . Statins Other (See Comments)    No energy and myalgias  . Lamisil [Terbinafine Hcl] Other (See Comments)    "Maybe made me nervous"  . Latex Rash    Review of Systems  Constitutional: Positive for malaise/fatigue.  HENT: Negative.   Eyes: Negative.   Respiratory: Negative.     Cardiovascular: Negative.   Gastrointestinal: Negative.   Genitourinary: Negative.   Musculoskeletal: Negative.   Skin: Negative.   Neurological: Negative.   Endo/Heme/Allergies: Negative.   Psychiatric/Behavioral: Negative.     Immunization History  Administered Date(s) Administered  . Influenza, High Dose Seasonal PF 09/04/2015, 08/29/2016  . Influenza-Unspecified 09/18/2013  . Pneumococcal Conjugate-13 12/27/2014  . Pneumococcal Polysaccharide-23 07/13/1998  . Td 03/29/2010    Objective:  BP (!) 120/54 (BP Location: Left Arm, Patient Position: Sitting, Cuff Size: Normal)   Pulse 76   Temp 97.8 F (36.6 C) (Oral)   Resp 16   Wt 172 lb (78 kg)   SpO2 98%   BMI 29.99 kg/m   Physical Exam  Constitutional: She is oriented to person, place, and time and well-developed, well-nourished, and in no distress.  Eyes: Conjunctivae and EOM are normal. Pupils are equal, round, and reactive to light.  Neck: Normal range of motion. Neck supple.  Cardiovascular: Normal rate, regular rhythm and intact distal pulses.   Murmur (2/6 systolic murmur) heard. Pulmonary/Chest: Effort normal and breath sounds normal.  Abdominal: Soft. Bowel sounds are normal.  Musculoskeletal: Normal range of motion.  Neurological: She is alert and oriented to person, place, and time. She has normal reflexes. Gait normal. GCS score is 15.  Skin: Skin is warm and dry.  Psychiatric: Mood, memory, affect and judgment normal.    Lab Results  Component Value Date   WBC 7.6 02/18/2017   HGB 12.3 12/27/2013   HCT 40.5 02/18/2017   PLT 139 (L) 02/18/2017   GLUCOSE 97 02/18/2017   CHOL 300 (H) 02/18/2017   TRIG 172 (H) 02/18/2017   HDL 62 02/18/2017   LDLCALC 204 (H) 02/18/2017   TSH 5.330 (H) 02/18/2017   HGBA1C 7.5 01/27/2017    CMP     Component Value Date/Time   NA 135 02/18/2017 0840   K 4.7 02/18/2017 0840   CL 96 02/18/2017 0840   CO2 21 02/18/2017 0840   GLUCOSE 97 02/18/2017 0840   GLUCOSE  50 (L) 05/21/2009 0653   BUN 26 02/18/2017 0840   CREATININE 1.23 (H) 02/18/2017 0840   CALCIUM 9.3 02/18/2017 0840   PROT 6.5 02/18/2017 0840   ALBUMIN 4.2 02/18/2017 0840   AST 21 02/18/2017 0840   ALT 20 02/18/2017 0840   ALKPHOS 112 02/18/2017 0840   BILITOT 0.4 02/18/2017 0840   GFRNONAA 39 (L) 02/18/2017 0840   GFRAA 45 (L) 02/18/2017 0840    Assessment and Plan :  1. Nausea Improved. continue current medications.   2. Other fatigue Improving. Continue current medications.   3. Gastroesophageal reflux disease, esophagitis presence not specified  4. Type  2 diabetes mellitus without complication, with long-term current use of insulin (HCC) Check A1c and TSH in July.  5.Hypothyroid 6.DDD\  HPI, Exam, and A&P Transcribed under the direction and in the presence of Richard L. Cranford Mon, MD  Electronically Signed: Katina Dung, CMA I have done the exam and reviewed the above chart and it is accurate to the best of my knowledge. Development worker, community has been used in this note in any air is in the dictation or transcription are unintentional.  [facility] 03/03/2017 3:01 PM

## 2017-03-03 NOTE — Patient Instructions (Addendum)
Stay on Ranitidine and Sucralfate until at least the next office visit. Will follow up in July with A1c and  TSH.

## 2017-03-04 ENCOUNTER — Other Ambulatory Visit: Payer: Self-pay

## 2017-03-17 ENCOUNTER — Telehealth: Payer: Self-pay | Admitting: Family Medicine

## 2017-03-17 MED ORDER — INSULIN GLARGINE 100 UNIT/ML SOLOSTAR PEN: PEN_INJECTOR | SUBCUTANEOUS | 12 refills | Status: DC

## 2017-03-17 NOTE — Telephone Encounter (Signed)
rx refilled.-aa 

## 2017-03-17 NOTE — Telephone Encounter (Signed)
Refill on   LANTUS SOLOSTAR 100 UNIT/ML Solostar Pen  She is using [facility] [address]  Pt request 90 day supply per [facility], [employer]

## 2017-03-19 ENCOUNTER — Other Ambulatory Visit: Payer: Self-pay

## 2017-03-19 MED ORDER — INSULIN GLARGINE 100 UNIT/ML SOLOSTAR PEN: PEN_INJECTOR | SUBCUTANEOUS | 3 refills | Status: DC

## 2017-03-25 ENCOUNTER — Ambulatory Visit
Admission: RE | Admit: 2017-03-25 | Discharge: 2017-03-25 | Disposition: A | Discharge status: Home / Self Care | Payer: PPO | Source: Ambulatory Visit | Attending: Anesthesiology | Admitting: Anesthesiology

## 2017-03-25 ENCOUNTER — Ambulatory Visit (HOSPITAL_BASED_OUTPATIENT_CLINIC_OR_DEPARTMENT_OTHER): Payer: PPO | Admitting: Anesthesiology

## 2017-03-25 ENCOUNTER — Encounter: Payer: Self-pay | Admitting: Anesthesiology

## 2017-03-25 ENCOUNTER — Other Ambulatory Visit: Payer: Self-pay | Admitting: Anesthesiology

## 2017-03-25 VITALS — BP 107/62 | HR 68 | Temp 97.0°F | Resp 16 | Ht 64.0 in | Wt 170.0 lb

## 2017-03-25 DIAGNOSIS — M4697 Unspecified inflammatory spondylopathy, lumbosacral region: Secondary | ICD-10-CM

## 2017-03-25 DIAGNOSIS — M961 Postlaminectomy syndrome, not elsewhere classified: Secondary | ICD-10-CM | POA: Diagnosis not present

## 2017-03-25 DIAGNOSIS — M5442 Lumbago with sciatica, left side: Secondary | ICD-10-CM | POA: Insufficient documentation

## 2017-03-25 DIAGNOSIS — E119 Type 2 diabetes mellitus without complications: Secondary | ICD-10-CM | POA: Diagnosis not present

## 2017-03-25 DIAGNOSIS — M47817 Spondylosis without myelopathy or radiculopathy, lumbosacral region: Secondary | ICD-10-CM

## 2017-03-25 DIAGNOSIS — H269 Unspecified cataract: Secondary | ICD-10-CM | POA: Diagnosis not present

## 2017-03-25 DIAGNOSIS — M5432 Sciatica, left side: Secondary | ICD-10-CM | POA: Diagnosis not present

## 2017-03-25 DIAGNOSIS — R52 Pain, unspecified: Secondary | ICD-10-CM

## 2017-03-25 DIAGNOSIS — M545 Low back pain, unspecified: Secondary | ICD-10-CM

## 2017-03-25 DIAGNOSIS — Z794 Long term (current) use of insulin: Secondary | ICD-10-CM | POA: Insufficient documentation

## 2017-03-25 MED ORDER — SODIUM CHLORIDE 0.9% FLUSH
10.0000 mL | Freq: Once | INTRAVENOUS | Status: AC
  Administered 2017-03-25: 10 mL

## 2017-03-25 MED ORDER — IOPAMIDOL (ISOVUE-M 200) INJECTION 41%
INTRAMUSCULAR | Status: AC
  Filled 2017-03-25: qty 10

## 2017-03-25 MED ORDER — LACTATED RINGERS IV SOLN: 1000.0000 mL | INTRAVENOUS | Status: DC

## 2017-03-25 MED ORDER — SODIUM CHLORIDE 0.9 % IJ SOLN
INTRAMUSCULAR | Status: AC
  Filled 2017-03-25: qty 10

## 2017-03-25 MED ORDER — IOPAMIDOL (ISOVUE-M 200) INJECTION 41%
20.0000 mL | Freq: Once | INTRAMUSCULAR | Status: DC | PRN
  Administered 2017-03-25: 10 mL
  Filled 2017-03-25: qty 20

## 2017-03-25 MED ORDER — ROPIVACAINE HCL 2 MG/ML IJ SOLN
10.0000 mL | Freq: Once | INTRAMUSCULAR | Status: AC
  Administered 2017-03-25: 10 mL via EPIDURAL
  Filled 2017-03-25: qty 10

## 2017-03-25 MED ORDER — MIDAZOLAM HCL 5 MG/5ML IJ SOLN
5.0000 mg | Freq: Once | INTRAMUSCULAR | Status: AC
  Administered 2017-03-25: 1.5 mg via INTRAVENOUS
  Filled 2017-03-25: qty 5

## 2017-03-25 MED ORDER — LIDOCAINE HCL (PF) 1 % IJ SOLN
5.0000 mL | Freq: Once | INTRAMUSCULAR | Status: AC
Start: 2017-03-25 — End: 2017-03-25
  Administered 2017-03-25: 5 mL via SUBCUTANEOUS
  Filled 2017-03-25: qty 5

## 2017-03-25 MED ORDER — TRIAMCINOLONE ACETONIDE 40 MG/ML IJ SUSP
40.0000 mg | Freq: Once | INTRAMUSCULAR | Status: AC
  Administered 2017-03-25: 40 mg
  Filled 2017-03-25: qty 1

## 2017-03-25 NOTE — Progress Notes (Signed)
Safety precautions to be maintained throughout the outpatient stay will include: orient to surroundings, keep bed in low position, maintain call bell within reach at all times, provide assistance with transfer out of bed and ambulation.  

## 2017-03-25 NOTE — Patient Instructions (Signed)
Post-procedure Information What to expect: Most procedures involve the use of a local anesthetic (numbing medicine), and a steroid (anti-inflammatory medicine).  The local anesthetics may cause temporary numbness and weakness of the legs or arms, depending on the location of the block. This numbness/weakness may last 4-6 hours, depending on the local anesthetic used. In rare instances, it can last up to 24 hours. While numb, you must be very careful not to injure the extremity.  After any procedure, you could expect the pain to get better within 15-20 minutes. This relief is temporary and may last 4-6 hours. Once the local anesthetics wears off, you could experience discomfort, possibly more than usual, for up to 10 (ten) days. In the case of radiofrequencies, it may last up to 6 weeks. Surgeries may take up to 8 weeks for the healing process. The discomfort is due to the irritation caused by needles going through skin and muscle. To minimize the discomfort, we recommend using ice the first day, and heat from then on. The ice should be applied for 15 minutes on, and 15 minutes off. Keep repeating this cycle until bedtime. Avoid applying the ice directly to the skin, to prevent frostbite. Heat should be used daily, until the pain improves (4-10 days). Be careful not to burn yourself.  Occasionally you may experience muscle spasms or cramps. These occur as a consequence of the irritation caused by the needle sticks to the muscle and the blood that will inevitably be lost into the surrounding muscle tissue. Blood tends to be very irritating to tissues, which tend to react by going into spasm. These spasms may start the same day of your procedure, but they may also take days to develop. This late onset type of spasm or cramp is usually caused by electrolyte imbalances triggered by the steroids, at the level of the kidney. Cramps and spasms tend to respond well to muscle relaxants, multivitamins (some are  triggered by the procedure, but may have their origins in vitamin deficiencies), and "Gatorade", or any sports drinks that can replenish any electrolyte imbalances. (If you are a diabetic, ask your pharmacist to get you a sugar-free brand.) Warm showers or baths may also be helpful. Stretching exercises are highly recommended.  General Instructions:  Be alert for signs of possible infection: redness, swelling, heat, red streaks, elevated temperature, and/or fever. These typically appear 4 to 6 days after the procedure. Immediately notify your doctor if you experience unusual bleeding, difficulty breathing, or loss of bowel or bladder control. If you experience increased pain, do not increase your pain medicine intake, unless instructed by your pain physician.  Post-Procedure Care:  Be careful in moving about. Muscle spasms in the area of the injection may occur. Applying ice or heat to the area is often helpful. The incidence of spinal headaches after epidural injections ranges between 1.4% and 6%. If you develop a headache that does not seem to respond to conservative therapy, please let your physician know. This can be treated with an epidural blood patch.   Post-procedure numbness or redness is to be expected, however it should average 4 to 6 hours. If numbness and weakness of your extremities begins to develop 4 to 6 hours after your procedure, and is felt to be progressing and worsening, immediately contact your physician.  Diet:  If you experience nausea, do not eat until this sensation goes away. If you had a "Stellate Ganglion Block" for upper extremity "Reflex Sympathetic Dystrophy", do not eat or drink until  your hoarseness goes away. In any case, always start with liquids first and if you tolerate them well, then slowly progress to more solid foods.  Activity:  For the first 4 to 6 hours after the procedure, use caution in moving about as you may experience numbness and/or weakness. Use  caution in cooking, using household electrical appliances, and climbing steps. If you need to reach your Doctor call our office: [phone] (During business hours) or [phone] (After business hours).  Business Hours: Monday-Thursday 8:00 am - 4:00 PM    Fridays: Closed     In case of an emergency: In case of emergency, call 911 or go to the nearest emergency room and have the physician there call us.  Interpretation of Procedure Every nerve block has two components: a diagnostic component, and a treatment component. Unrealistic expectations are the most common causes of "perceived failure".  In a perfect world, a single nerve block should be able to completely and permanently eliminate the pain. Sadly, the world is not perfect.  Most pain management nerve blocks are performed using local anesthetics and steroids. Steroids are responsible for any long-term benefit that you may experience. Their purpose is to decrease any chronic swelling that may exist in the area. Steroids begin to work immediately after being injected. However, most patients will not experience any benefits until 5 to 10 days after the injection, when the swelling has come down to the point where they can tell a difference. Steroids will only help if there is swelling to be treated. As such, they can assist with the diagnosis. If effective, they suggest an inflammatory component to the pain, and if ineffective, they rule out inflammation as the main cause or component of the problem. If the problem is one of mechanical compression, you will get no benefit from those steroids.   In the case of local anesthetics, they have a crucial role in the diagnosis of your condition. Most will begin to work within15 to 20 minutes after injection. The duration will depend on the type used (short- vs. Long-acting). It is of outmost importance that patients keep tract of their pain, after the procedure. To assist with this matter, a  "Post-procedure Pain Diary" is provided. Make sure to complete it and to bring it back to your follow-up appointment.  As long as the patient keeps accurate, detailed records of their symptoms after every procedure, and returns to have those interpreted, every procedure will provide [name] with invaluable information. Even a block that does not provide the patient with any relief, will always provide [name] with information about the mechanism and the origin of the pain. The only time a nerve block can be considered a waste of time is when patients do not keep track of the results, or do not keep their post-procedure appointment.  Reporting the results back to your physician The Pain Score  Pain is a subjective complaint. It cannot be seen, touched, or measured. We depend entirely on the patient's report of the pain in order to assess your condition and treatment. To evaluate the pain, we use a pain scale, where "0" means "No Pain", and a "10" is "the worst possible pain that you can even imagine" (i.e. something like been eaten alive by a shark or being torn apart by a lion).   Use the Pain Scale provided. You will frequently be asked to rate your pain. Please be accurate, remember that medical decisions will be based on your responses. Please  do not rate your pain above a 10. Doing so is actually interpreted as "symptom magnification" (exaggeration). To put this into perspective, when you tell us that your pain is at a 10 (ten), what you are saying is that there is nothing we can do to make this pain any worse. (Carefully think about that.) _____________________________________________________________________________________________  Epidural Steroid Injection An epidural steroid injection is a shot of steroid medicine and numbing medicine that is given into the space between the spinal cord and the bones in your back (epidural space). The shot helps relieve pain caused by an irritated or swollen nerve  root. The amount of pain relief you get from the injection depends on what is causing the nerve to be swollen and irritated, and how long your pain lasts. You are more likely to benefit from this injection if your pain is strong and comes on suddenly rather than if you have had pain for a long time. Tell a health care provider about:  Any allergies you have.  All medicines you are taking, including vitamins, herbs, eye drops, creams, and over-the-counter medicines.  Any problems you or family members have had with anesthetic medicines.  Any blood disorders you have.  Any surgeries you have had.  Any medical conditions you have.  Whether you are pregnant or may be pregnant. What are the risks? Generally, this is a safe procedure. However, problems may occur, including:  Headache.  Bleeding.  Infection.  Allergic reaction to medicines.  Damage to your nerves. What happens before the procedure? Staying hydrated  Follow instructions from your health care provider about hydration, which may include:  Up to 2 hours before the procedure - you may continue to drink clear liquids, such as water, clear fruit juice, black coffee, and plain tea. Eating and drinking restrictions  Follow instructions from your health care provider about eating and drinking, which may include:  8 hours before the procedure - stop eating heavy meals or foods such as meat, fried foods, or fatty foods.  6 hours before the procedure - stop eating light meals or foods, such as toast or cereal.  6 hours before the procedure - stop drinking milk or drinks that contain milk.  2 hours before the procedure - stop drinking clear liquids. Medicine   You may be given medicines to lower anxiety.  Ask your health care provider about:  Changing or stopping your regular medicines. This is especially important if you are taking diabetes medicines or blood thinners.  Taking medicines such as aspirin and ibuprofen.  These medicines can thin your blood. Do not take these medicines before your procedure if your health care provider instructs you not to. General instructions   Plan to have someone take you home from the hospital or clinic. What happens during the procedure?  You may receive a medicine to help you relax (sedative).  You will be asked to lie on your abdomen.  The injection site will be cleaned.  A numbing medicine (local anesthetic) will be used to numb the injection site.  A needle will be inserted through your skin into the epidural space. You may feel some discomfort when this happens. An X-ray machine will be used to make sure the needle is put as close as possible to the affected nerve.  A steroid medicine and a local anesthetic will be injected into the epidural space.  The needle will be removed.  A bandage (dressing) will be put over the injection site. What happens after  the procedure?  Your blood pressure, heart rate, breathing rate, and blood oxygen level will be monitored until the medicines you were given have worn off.  Your arm or leg may feel weak or numb for a few hours.  The injection site may feel sore.  Do not drive for 24 hours if you received a sedative. This information is not intended to replace advice given to you by your health care provider. Make sure you discuss any questions you have with your health care provider. Document Released: 02/11/2008 Document Revised: 04/17/2016 Document Reviewed: 02/20/2016 [employer] Interactive Patient Education  2017 [place]

## 2017-03-25 NOTE — Progress Notes (Signed)
Subjective:  Patient ID: Jasmine Webster, female    DOB: 1926-09-23  Age: 83 y.o. MRN: [id number]  CC: Back Pain (left hip and right groin)   Service Provided on Last Visit: Procedure PROCEDURE:Caudal epidural steroid No. 3 under fluoroscopic guidance with moderate sedation  HPI SHAYA REDDICK presents for Reevaluation. She continues to respond favorably to the caudal epidural steroid injections. She states that overall her pain is 80-90% better. She occasionally gets some groin pain and posterior leg pain but it is much better than before. She tries to do her exercises for lower extremity strengthening and core strengthening. She desires to proceed with her third epidural today.   By history She is a pleasant 83 year old white female with a long-standing history of low back pain. She describes having had multiple procedures on her low back and neck and has undergone a low thoracic to lower lumbar spinal fusion. She was recently seen by Dr. Earle Gell and subsequently referred to [name] for her sciatica. She experiences this primarily in the left lower back with radiation into the left posterior leg and describes it as a maximum pain score of 6 that does not appear to be influenced by time of day but is worse with prolonged sitting. Alleviating factors include lying down and particular positions to decompress her back seems to help. She does not have any appreciable lower extremity weakness. She occasionally gets some cramping type pain especially in the left lower back with radiation into the buttocks and hip that is burning and constant. She cannot get any relief at this time and has been on medication management and has gone through conservative therapy without significant improvement. She also has had a previous CT myelogram and I reviewed the results of this today.  History Annali has a past medical history of Arthritis; Cataract; and Diabetes mellitus without complication ([place]).   She has a  past surgical history that includes Back surgery; Appendectomy; Abdominal hysterectomy; Tonsillectomy and adenoidectomy; Cataract extraction; and Thoracolumbar syringo shunt.   Her family history includes Cancer in her brother and brother; Diabetes in her father; Heart disease in her mother and sister; Pneumonia in her father; Stroke in her brother, father, and sister; Sudden death in her brother, sister, and sister.She reports that she has never smoked. She has never used smokeless tobacco. She reports that she does not drink alcohol or use drugs.  No results found for this or any previous visit.  No results found for: TOXASSSELUR  Outpatient Medications Prior to Visit  Medication Sig Dispense Refill  . ALPRAZolam (XANAX) 0.5 MG tablet TAKE ONE TABLET (0.5 MG TOTAL) BY MOUTH AT BEDTIME AS NEEDED 30 tablet 5  . amLODipine (NORVASC) 5 MG tablet TAKE 1 TABLET BY MOUTH EVERY DAY 90 tablet 3  . aspirin 81 MG tablet Take 81 mg by mouth daily.    Marland Kitchen gabapentin (NEURONTIN) 100 MG capsule 200 mg 2 (two) times daily.     Marland Kitchen glucose blood (ONE TOUCH ULTRA TEST) test strip Check sugar twice daily. 100 each 3  . Insulin Glargine (LANTUS SOLOSTAR) 100 UNIT/ML Solostar Pen INJECT 28 UNITS SUBCUTANEOUSLY AT BEDTIME 27 pen 3  . Insulin Pen Needle 31G X 8 MM MISC Check sugar 2 times daily, DX E11.9 (needs ultra fine pen needles) 90 each 12  . Multiple Vitamins-Minerals (PRESERVISION AREDS) CAPS Take 2 capsules by mouth daily.     . Omega-3 Fatty Acids (FISH OIL) 1000 MG CAPS Take 1,000 mg by mouth daily.     Marland Kitchen  quinapril-hydrochlorothiazide (ACCURETIC) 20-12.5 MG tablet TAKE 1 TABLET BY MOUTH TWICE A DAY 180 tablet 3  . ranitidine (ZANTAC) 150 MG tablet Take 1 tablet (150 mg total) by mouth 2 (two) times daily. 60 tablet 12  . sucralfate (CARAFATE) 1 g tablet Take 1 tablet (1 g total) by mouth 4 (four) times daily -  with meals and at bedtime. 120 tablet 12   No facility-administered medications prior to visit.     Lab Results  Component Value Date   WBC 7.6 02/18/2017   HGB 12.3 12/27/2013   HCT 40.5 02/18/2017   PLT 139 (L) 02/18/2017   GLUCOSE 97 02/18/2017   CHOL 300 (H) 02/18/2017   TRIG 172 (H) 02/18/2017   HDL 62 02/18/2017   LDLCALC 204 (H) 02/18/2017   ALT 20 02/18/2017   AST 21 02/18/2017   NA 135 02/18/2017   K 4.7 02/18/2017   CL 96 02/18/2017   CREATININE 1.23 (H) 02/18/2017   BUN 26 02/18/2017   CO2 21 02/18/2017   TSH 5.330 (H) 02/18/2017   HGBA1C 7.5 01/27/2017    --------------------------------------------------------------------------------------------------------------------- Ct Lumbar Spine W Contrast  Result Date: 12/18/2016 CLINICAL DATA:  Back pain EXAM: CT MYELOGRAPHY LUMBAR SPINE TECHNIQUE: CT imaging of the lumbar spine was performed after intrathecal contrast administration. Multiplanar CT image reconstructions were also generated. COMPARISON:  None. FINDINGS: There are bilateral pedicle screws in T12 through S1. There are cross stabilizing bars extending from the thoracic spine to the L4 pedicle screws. There are cross stabilizing bars extending from the L5-S1 pedicle screws. There are disc spacers present at every level except for L1-2 in above. There is solid fusion in the posterior elements from T11 through S1. The study extends from the mid T11 level. There is also solid fusion across the L2-3, L3-4, and L5-S1 discs. Fusion across the L4-5 disc has not yet occurred. Segmentation:  5 lumbar type vertebral bodies. Alignment: Normal. Vertebrae: There is no vertebral compression deformity. Conus medullaris: Extends to the L1-2 disc level and appears normal. Paraspinal and other soft tissues: Aortic atherosclerotic calcification. Disc levels: L1-2: Central canal is widely patent. L2-3:  Central canal is widely patent.  Posterior decompression. L3-4:  Central canal is widely patent.  Posterior decompression. L4-5: Posterior decompression. Central canal is widely patent.  Disc osteophytes to encroach upon the foramina bilaterally. This results in mild foraminal narrowing. L5-S1: Central canal is widely patent. Disc osteophytes mildly encroach upon the foramina bilaterally. No obvious narrowing. Posterior decompression. IMPRESSION: Thoracolumbar fusion is solid as described above. This includes solid fusion across the posterior elements at L4-5 without solid fusion across the disc space. I suspect that the apparent abnormal motion noted on plain radiographs was artifactual and fusion is solid and intact. There is mild foraminal narrowing at L4-5 secondary to disc osteophytes. Electronically Signed   By: Marybelle Killings M.D.   On: 12/18/2016 15:15   Dg Myelography Lumbar Inj Lumbosacral  Result Date: 12/18/2016 CLINICAL DATA:  Back pain.  Lumbar fusion. EXAM: LUMBAR MYELOGRAM FLUOROSCOPY TIME:  37 seconds.  15.6 mGy. PROCEDURE: After thorough discussion of risks and benefits of the procedure including bleeding, infection, injury to nerves, blood vessels, adjacent structures as well as headache and CSF leak, written and oral informed consent was obtained. Consent was obtained by Dr. Marybelle Killings. Time out form was completed. Patient was positioned prone on the fluoroscopy table. Local anesthesia was provided with 1% lidocaine without epinephrine after prepped and draped in the usual sterile fashion. Puncture  was performed at L3-4 using a 3 1/2 inch 22-gauge spinal needle via a midline approach. Using a single pass through the dura, the needle was placed within the thecal sac, with return of clear CSF. 15 mL of Omnipaque-180 was injected into the thecal sac, with normal opacification of the nerve roots and cauda equina consistent with free flow within the subarachnoid space. I personally performed the lumbar puncture and administered the intrathecal contrast. I also personally supervised acquisition of the myelogram images. TECHNIQUE: Contiguous axial images were obtained through the  Lumbar spine after the intrathecal infusion of infusion. Coronal and sagittal reconstructions were obtained of the axial image sets. COMPARISON:  None FINDINGS: There are bilateral pedicle screw from L1 through S1 as well as in the lower thoracic spine. Cross stabilizing bars extend from the thoracic spine to the L4 pedicle screws. There are also L5-S1 pedicle screws with cross stabilizing bars. There is no breakage or loosening of the hardware. There is anatomic alignment of the vertebral bodies. There is no vertebral compression deformity. No obvious spinal stenosis is present. A disc spacer is present at L4-5. There is some motion in the disc between flexion and extension views. IMPRESSION: Extensive thoracolumbosacral fusion as described. There is noted to be some degree of motion at the L4-5 level. A disc spacer is present at this level but there is no posterior stabilization. Electronically Signed   By: Marybelle Killings M.D.   On: 12/18/2016 14:38       ---------------------------------------------------------------------------------------------------------------------- Past Medical History:  Diagnosis Date  . Arthritis   . Cataract    cataract removal bilaterally approx 20 years ago  . Diabetes mellitus without complication [facility])     Past Surgical History:  Procedure Laterality Date  . ABDOMINAL HYSTERECTOMY    . APPENDECTOMY    . BACK SURGERY     three  . CATARACT EXTRACTION    . THORACOLUMBAR SYRINGO SHUNT    . TONSILLECTOMY AND ADENOIDECTOMY      Family History  Problem Relation Age of Onset  . Heart disease Mother     died from MI  . Heart disease Sister   . Sudden death Brother     shot and beaten to death during home invasion  . Stroke Sister   . Sudden death Sister     MVA  . Cancer Brother     died from lung cancer  . Sudden death Sister     28 days old  . Cancer Brother     died from lung cancer  . Stroke Brother     cause of death  . Diabetes Father   . Stroke  Father   . Pneumonia Father     Social History  Substance Use Topics  . Smoking status: Never Smoker  . Smokeless tobacco: Never Used  . Alcohol use No    ---------------------------------------------------------------------------------------------------------------------  Scheduled Meds: Continuous Infusions: PRN Meds:.   BP (!) 112/45   Pulse 72   Temp 98.7 F (37.1 C) (Oral)   Resp 16   Ht 5\' 4"  (1.626 m)   Wt 170 lb (77.1 kg)   SpO2 99%   BMI 29.18 kg/m    BP Readings from Last 3 Encounters:  03/25/17 (!) 112/45  03/03/17 (!) 120/54  02/17/17 134/62     Wt Readings from Last 3 Encounters:  03/25/17 170 lb (77.1 kg)  03/03/17 172 lb (78 kg)  02/17/17 170 lb (77.1 kg)     ----------------------------------------------------------------------------------------------------------------------  ROS Review  of Systems  Cardiac: No angina takes a daily aspirin with a history of a murmur GI: History of ulcers but no hematemesis or hematochezia The remainder of review of systems was negativeAnd without changeOnce again  Objective:  BP (!) 112/45   Pulse 72   Temp 98.7 F (37.1 C) (Oral)   Resp 16   Ht 5\' 4"  (1.626 m)   Wt 170 lb (77.1 kg)   SpO2 99%   BMI 29.18 kg/m   Physical Exam Pupils are equally round reactive to light extraocular muscles are intact and the patient is a good historian Heart reveals a 2/6 systolic ejection murmur with regular rhythm and rate Lungs are clear to auscultation without wheeze She has some mild paraspinous muscle lumbar tenderness but no overt trigger points. Strength is at baseline.     Assessment & Plan:   Sakari was seen today for back pain.  Diagnoses and all orders for this visit:  Facet arthritis of lumbosacral region [facility])  Sciatica of left side -     triamcinolone acetonide (KENALOG-40) injection 40 mg; 1 mL (40 mg total) by Other route once. -     sodium chloride flush (NS) 0.9 % injection 10 mL; 10  mLs by Other route once. -     ropivacaine (PF) 2 mg/mL (0.2%) (NAROPIN) injection 10 mL; 10 mLs by Epidural route once. -     midazolam (VERSED) 5 MG/5ML injection 5 mg; Inject 5 mLs (5 mg total) into the vein once. -     lidocaine (PF) (XYLOCAINE) 1 % injection 5 mL; Inject 5 mLs into the skin once. -     lactated ringers infusion 1,000 mL; Inject 1,000 mLs into the vein continuous. -     iopamidol (ISOVUE-M) 41 % intrathecal injection 20 mL; 20 mLs by Other route once as needed for contrast. -     Lumbar Epidural Injection; Future  Low back pain at multiple sites     ----------------------------------------------------------------------------------------------------------------------  Problem List Items Addressed This Visit    None    Visit Diagnoses    Facet arthritis of lumbosacral region [facility])    -  Primary   Relevant Medications   triamcinolone acetonide (KENALOG-40) injection 40 mg (Completed)   Sciatica of left side       Relevant Medications   triamcinolone acetonide (KENALOG-40) injection 40 mg (Completed)   sodium chloride flush (NS) 0.9 % injection 10 mL (Completed)   ropivacaine (PF) 2 mg/mL (0.2%) (NAROPIN) injection 10 mL (Completed)   midazolam (VERSED) 5 MG/5ML injection 5 mg (Completed)   lidocaine (PF) (XYLOCAINE) 1 % injection 5 mL (Completed)   lactated ringers infusion 1,000 mL   iopamidol (ISOVUE-M) 41 % intrathecal injection 20 mL   Other Relevant Orders   Lumbar Epidural Injection   Low back pain at multiple sites       Relevant Medications   triamcinolone acetonide (KENALOG-40) injection 40 mg (Completed)      ----------------------------------------------------------------------------------------------------------------------  1. Failed back surgical syndrome We will proceed with her Third epidural with caudal approach today with return to clinic in approximately  3 months for an evaluation. 2. Facet arthritis of lumbosacral region [facility]) we have  talked about possible diagnostic and therapeutic lumbar facet blocks if she fails conservative and current therapy.  3. Sciatica of left side As above - Lumbar Epidural Injection; Future  4. Low back pain at multiple sites She may restart her Vicodin and use it periodically if necessary for pain treatment as  previously discussed.   ----------------------------------------------------------------------------------------------------------------------  I am having Ms. Crusoe maintain her Insulin Pen Needle, gabapentin, PRESERVISION AREDS, aspirin, ALPRAZolam, Fish Oil, ranitidine, sucralfate, amLODipine, quinapril-hydrochlorothiazide, glucose blood, and Insulin Glargine. We administered triamcinolone acetonide, sodium chloride flush, ropivacaine (PF) 2 mg/mL (0.2%), midazolam, lidocaine (PF), and iopamidol.   Meds ordered this encounter  Medications  . triamcinolone acetonide (KENALOG-40) injection 40 mg  . sodium chloride flush (NS) 0.9 % injection 10 mL  . ropivacaine (PF) 2 mg/mL (0.2%) (NAROPIN) injection 10 mL  . midazolam (VERSED) 5 MG/5ML injection 5 mg  . lidocaine (PF) (XYLOCAINE) 1 % injection 5 mL  . lactated ringers infusion 1,000 mL  . iopamidol (ISOVUE-M) 41 % intrathecal injection 20 mL    Procedure: Total epidural steroid No.3under fluoroscopic guidance with moderate sedation  After informed consent was obtained and the risks benefits reviewed patient chose to pursue a caudal epidural steroid injection today. With the patient in the prone position and an IV in place, sedation was with IV 1.5 mg versed. Using AP and lateral fluoroscopic guidance I identified the area overlying the sacral hiatus. This area was broadly prepped with DuraPrep 3 and we utilized strict aseptic technique during the procedure. 1% lidocaine was infiltrated 2 cc using a 25-gauge needle overlying the sacral cornu and then using lateral fluoroscopic guidance I advanced an 18-gauge Touhy needle  approximately 2 cm through the sacral hiatus. Confirmation was with 2 cc of Isovue yielding good epidural spread and no evidence of IV or subarachnoid uptake. This was followed by an injection of 5 cc of saline mixed with 1 cc of ropivacaine 0.2% and 40 mg of triamcinolone. This was tolerated without difficulty the patient was convalesced and discharged home in stable condition for follow-up as mentioned. This was performed with latex free status. JA  Follow-up: Return in about 3 months (around 06/25/2017) for evaluation, procedure.    Molli Barrows, MD 2:43 PM  The [place] practitioner database for opioid medications on this patient has been reviewed by me and my staff   Greater than 50% of the total encounter time was spent in counseling and / or coordination of care.     This dictation was performed utilizing Systems analyst.  Please excuse any unintentional or mistaken typographical errors as a result.

## 2017-03-26 ENCOUNTER — Telehealth: Payer: Self-pay | Admitting: *Deleted

## 2017-03-26 NOTE — Telephone Encounter (Signed)
Denies post procedure problems. 

## 2017-03-31 ENCOUNTER — Other Ambulatory Visit: Payer: Self-pay | Admitting: Family Medicine

## 2017-03-31 MED ORDER — INSULIN PEN NEEDLE 31G X 8 MM MISC: 12 refills | Status: DC

## 2017-03-31 NOTE — Telephone Encounter (Signed)
[facility] Insulin Pen Needle 31G X 8 MM MISC,  She needs a refill but they dont have an order yet.  [facility] [address]  Thank sTeri

## 2017-03-31 NOTE — Telephone Encounter (Signed)
Have not received a request yet, refilled at this time-aa

## 2017-04-01 ENCOUNTER — Telehealth: Payer: Self-pay

## 2017-04-01 NOTE — Telephone Encounter (Signed)
Patient called saying that she has developed red spots on her arms and thinks it could be coming from the medications that she just started. She reports that she has had 3 steroid injections since March, and she has started sucralfate and ranitidine in the past 2 months.   Patient wanted to know if the sucralfate and ranitidine could be causing her red spots? Per Dr. Rosanna Randy, these 2 medications could least likely be causing these symptoms. Patient reports that she will continue her medication, and if her red spots worsen she will call the office for evaluation.

## 2017-04-16 ENCOUNTER — Telehealth: Payer: Self-pay | Admitting: Family Medicine

## 2017-04-16 NOTE — Telephone Encounter (Signed)
ok 

## 2017-04-16 NOTE — Telephone Encounter (Signed)
Pt has a one touch ultra 2.  She said the mail order pharmacy sent the wrong needles.  She needs [name] to [facility] [address] for [facility] correct needles.  Pt's call back [phone]  Thanks teri

## 2017-04-17 ENCOUNTER — Telehealth: Payer: Self-pay | Admitting: Family Medicine

## 2017-04-17 DIAGNOSIS — E118 Type 2 diabetes mellitus with unspecified complications: Secondary | ICD-10-CM

## 2017-04-17 MED ORDER — GLUCOSE BLOOD VI STRP: ORAL_STRIP | 3 refills | Status: DC

## 2017-04-17 NOTE — Telephone Encounter (Signed)
Please review. It looks like it was ordered on 02/28/17

## 2017-04-17 NOTE — Telephone Encounter (Signed)
Had been ordered for one year on 02/28/17 by Dr. Darnell Level. New refill sent in since they may have not received first.

## 2017-04-17 NOTE — Telephone Encounter (Signed)
[facility] the [facility] medications:  glucose blood (ONE TOUCH ULTRA TEST) test strip.  [facility]  [address]  This is a pt of Dr Wilmon Arms

## 2017-04-18 ENCOUNTER — Emergency Department: Payer: PPO

## 2017-04-18 ENCOUNTER — Encounter: Payer: Self-pay | Admitting: Emergency Medicine

## 2017-04-18 ENCOUNTER — Emergency Department
Admission: EM | Admit: 2017-04-18 | Discharge: 2017-04-18 | Disposition: A | Discharge status: Home / Self Care | Payer: PPO | Attending: Emergency Medicine | Admitting: Emergency Medicine

## 2017-04-18 DIAGNOSIS — Z7982 Long term (current) use of aspirin: Secondary | ICD-10-CM | POA: Diagnosis not present

## 2017-04-18 DIAGNOSIS — Z794 Long term (current) use of insulin: Secondary | ICD-10-CM | POA: Insufficient documentation

## 2017-04-18 DIAGNOSIS — Z79899 Other long term (current) drug therapy: Secondary | ICD-10-CM | POA: Diagnosis not present

## 2017-04-18 DIAGNOSIS — I10 Essential (primary) hypertension: Secondary | ICD-10-CM | POA: Diagnosis not present

## 2017-04-18 DIAGNOSIS — E119 Type 2 diabetes mellitus without complications: Secondary | ICD-10-CM | POA: Insufficient documentation

## 2017-04-18 DIAGNOSIS — M6281 Muscle weakness (generalized): Secondary | ICD-10-CM | POA: Diagnosis not present

## 2017-04-18 DIAGNOSIS — R5383 Other fatigue: Secondary | ICD-10-CM | POA: Diagnosis not present

## 2017-04-18 DIAGNOSIS — E039 Hypothyroidism, unspecified: Secondary | ICD-10-CM | POA: Diagnosis not present

## 2017-04-18 DIAGNOSIS — R112 Nausea with vomiting, unspecified: Secondary | ICD-10-CM | POA: Diagnosis not present

## 2017-04-18 DIAGNOSIS — R110 Nausea: Secondary | ICD-10-CM | POA: Insufficient documentation

## 2017-04-18 DIAGNOSIS — N390 Urinary tract infection, site not specified: Secondary | ICD-10-CM | POA: Diagnosis not present

## 2017-04-18 DIAGNOSIS — R42 Dizziness and giddiness: Secondary | ICD-10-CM | POA: Diagnosis not present

## 2017-04-18 HISTORY — DX: Other chronic pain: G89.29

## 2017-04-18 HISTORY — DX: Dorsalgia, unspecified: M54.9

## 2017-04-18 LAB — URINE DRUG SCREEN, QUALITATIVE (ARMC ONLY)
Amphetamines, Ur Screen: NOT DETECTED
BENZODIAZEPINE, UR SCRN: POSITIVE — AB
Barbiturates, Ur Screen: NOT DETECTED
COCAINE METABOLITE, UR ~~LOC~~: NOT DETECTED
Cannabinoid 50 Ng, Ur ~~LOC~~: NOT DETECTED
MDMA (Ecstasy)Ur Screen: NOT DETECTED
Methadone Scn, Ur: NOT DETECTED
Opiate, Ur Screen: POSITIVE — AB
Phencyclidine (PCP) Ur S: NOT DETECTED
Tricyclic, Ur Screen: NOT DETECTED

## 2017-04-18 LAB — BASIC METABOLIC PANEL
ANION GAP: 10 (ref 5–15)
BUN: 27 mg/dL — AB (ref 6–20)
CHLORIDE: 99 mmol/L — AB (ref 101–111)
CO2: 24 mmol/L (ref 22–32)
Calcium: 9.4 mg/dL (ref 8.9–10.3)
Creatinine, Ser: 1.11 mg/dL — ABNORMAL HIGH (ref 0.44–1.00)
GFR calc Af Amer: 50 mL/min — ABNORMAL LOW (ref >60)
GFR calc non Af Amer: 43 mL/min — ABNORMAL LOW (ref >60)
GLUCOSE: 100 mg/dL — AB (ref 65–99)
Potassium: 4 mmol/L (ref 3.5–5.1)
Sodium: 133 mmol/L — ABNORMAL LOW (ref 135–145)

## 2017-04-18 LAB — URINALYSIS, COMPLETE (UACMP) WITH MICROSCOPIC
BILIRUBIN URINE: NEGATIVE
GLUCOSE, UA: NEGATIVE mg/dL
HGB URINE DIPSTICK: NEGATIVE
KETONES UR: NEGATIVE mg/dL
NITRITE: NEGATIVE
Protein, ur: NEGATIVE mg/dL
Specific Gravity, Urine: 1.013 (ref 1.005–1.030)
pH: 5 (ref 5.0–8.0)

## 2017-04-18 LAB — CBC
HEMATOCRIT: 40.2 % (ref 35.0–47.0)
HEMOGLOBIN: 13.7 g/dL (ref 12.0–16.0)
MCH: 33.2 pg (ref 26.0–34.0)
MCHC: 34 g/dL (ref 32.0–36.0)
MCV: 97.5 fL (ref 80.0–100.0)
Platelets: 162 10*3/uL (ref 150–440)
RBC: 4.12 MIL/uL (ref 3.80–5.20)
RDW: 13.8 % (ref 11.5–14.5)
WBC: 9.1 10*3/uL (ref 3.6–11.0)

## 2017-04-18 LAB — HEPATIC FUNCTION PANEL
ALT: 19 U/L (ref 14–54)
AST: 25 U/L (ref 15–41)
Albumin: 4.1 g/dL (ref 3.5–5.0)
Alkaline Phosphatase: 88 U/L (ref 38–126)
BILIRUBIN TOTAL: 0.7 mg/dL (ref 0.3–1.2)
Total Protein: 6.6 g/dL (ref 6.5–8.1)

## 2017-04-18 LAB — MAGNESIUM: MAGNESIUM: 1.7 mg/dL (ref 1.7–2.4)

## 2017-04-18 LAB — TROPONIN I

## 2017-04-18 MED ORDER — ONDANSETRON HCL 4 MG/2ML IJ SOLN
4.0000 mg | Freq: Once | INTRAMUSCULAR | Status: AC
  Administered 2017-04-18: 4 mg via INTRAVENOUS
  Filled 2017-04-18: qty 2

## 2017-04-18 MED ORDER — CEPHALEXIN 500 MG PO CAPS
500.0000 mg | ORAL_CAPSULE | Freq: Once | ORAL | Status: AC
  Administered 2017-04-18: 500 mg via ORAL
  Filled 2017-04-18: qty 1

## 2017-04-18 MED ORDER — SODIUM CHLORIDE 0.9 % IV BOLUS (SEPSIS)
500.0000 mL | Freq: Once | INTRAVENOUS | Status: AC
  Administered 2017-04-18: 500 mL via INTRAVENOUS

## 2017-04-18 MED ORDER — ONDANSETRON HCL 4 MG PO TABS: 4.0000 mg | ORAL_TABLET | Freq: Three times a day (TID) | ORAL | 0 refills | Status: DC | PRN

## 2017-04-18 MED ORDER — CEPHALEXIN 500 MG PO CAPS: 500.0000 mg | ORAL_CAPSULE | Freq: Three times a day (TID) | ORAL | 0 refills | Status: AC

## 2017-04-18 NOTE — ED Provider Notes (Addendum)
[facility] Emergency Department Provider Note  ____________________________________________   I have reviewed the triage vital signs and the nursing notes.   HISTORY  Chief Complaint Weakness    HPI Jasmine Webster is a 83 y.o. female , who states that she's been feeling nauseated for 5 months. Worse over the last few days. She is seen her primary care doctor several times for this. She cannot really further elucidate the symptom. She has not been vomiting she is eating well she has not lost weight she has had no fever no chills no headache no stiff neck no focal numbness no focal weakness no abdominal pain, she has no pain when she eats, she has not actually vomited, she is eating just fine, normal bowel movements normal urination. She states over the last few weeks sometimes she feels like "all my nerves are firing at once " . She denies any true vertigo symptoms. In short, besides nausea and the above nerve issue, there is no focal complaints. Nothing makes it better nothing makes it worse is not sound positional necessarily, comes and goes. She has been seeing Dr. Rosanna Randy for this, but a friend of her's who is a pediatrician apparently advised her that she needed to have something done that had not already been done and she came into the emergency room. Patient states also that has not yet seen a GI doctor for her chronic nausea  Past Medical History:  Diagnosis Date  . Arthritis   . Cataract    cataract removal bilaterally approx 20 years ago  . Chronic back pain   . Diabetes mellitus without complication [facility])     Patient Active Problem List   Diagnosis Date Noted  . Anxiety 03/23/2015  . Arthritis 03/23/2015  . Cardiovascular disease 03/23/2015  . Urinary system disease 03/23/2015  . Malignant neoplasm of corpus uteri, except isthmus ([place]) 03/23/2015  . Essential (primary) hypertension 03/23/2015  . Acid reflux 03/23/2015  . Cardiac murmur  03/23/2015  . Hypercholesteremia 03/23/2015  . Adult hypothyroidism 03/23/2015  . Cannot sleep 03/23/2015  . Malaise and fatigue 03/23/2015  . Muscle ache 03/23/2015  . Arthritis, degenerative 03/23/2015  . Diabetes mellitus, type 2 ([place]) 03/23/2015    Past Surgical History:  Procedure Laterality Date  . ABDOMINAL HYSTERECTOMY    . APPENDECTOMY    . BACK SURGERY     three  . CATARACT EXTRACTION    . THORACOLUMBAR [place]      Prior to Admission medications   Medication Sig Start Date End Date Taking? Authorizing Provider  ALPRAZolam Duanne Moron) 0.5 MG tablet TAKE ONE TABLET (0.5 MG TOTAL) BY MOUTH AT BEDTIME AS NEEDED 12/03/16   Jerrol Banana., MD  amLODipine (NORVASC) 5 MG tablet TAKE 1 TABLET BY MOUTH EVERY DAY 02/25/17   Jerrol Banana., MD  aspirin 81 MG tablet Take 81 mg by mouth daily.    [provider]  gabapentin (NEURONTIN) 100 MG capsule 200 mg 2 (two) times daily.  11/15/16   [provider]  glucose blood (ONE TOUCH ULTRA TEST) test strip Check sugar twice daily. 04/17/17   Mar Daring, PA-C  Insulin Glargine (LANTUS SOLOSTAR) 100 UNIT/ML Solostar Pen INJECT 28 UNITS SUBCUTANEOUSLY AT BEDTIME 03/19/17   Jerrol Banana., MD  Insulin Pen Needle 31G X 8 MM MISC Check sugar 2 times daily, DX E11.9 (needs ultra fine pen needles) 03/31/17   Miguel Aschoff  Kaylyn Lim., MD  Multiple Vitamins-Minerals (PRESERVISION AREDS) CAPS Take 2 capsules by mouth daily.     [provider]  Omega-3 Fatty Acids (FISH OIL) 1000 MG CAPS Take 1,000 mg by mouth daily.     [provider]  quinapril-hydrochlorothiazide (ACCURETIC) 20-12.5 MG tablet TAKE 1 TABLET BY MOUTH TWICE A DAY 02/25/17   Jerrol Banana., MD  ranitidine (ZANTAC) 150 MG tablet Take 1 tablet (150 mg total) by mouth 2 (two) times daily. 02/17/17   Jerrol Banana., MD  sucralfate (CARAFATE) 1 g tablet Take 1 tablet (1 g  total) by mouth 4 (four) times daily -  with meals and at bedtime. 02/17/17   Jerrol Banana., MD    Allergies Lodine [etodolac]; Naproxen; Statins; Lamisil [terbinafine hcl]; and Latex  Family History  Problem Relation Age of Onset  . Heart disease Mother        died from MI  . Heart disease Sister   . Sudden death Brother        shot and beaten to death during home invasion  . Stroke Sister   . Sudden death Sister        MVA  . Cancer Brother        died from lung cancer  . Sudden death Sister        24 days old  . Cancer Brother        died from lung cancer  . Stroke Brother        cause of death  . Diabetes Father   . Stroke Father   . Pneumonia Father     Social History Social History  Substance Use Topics  . Smoking status: Never Smoker  . Smokeless tobacco: Never Used  . Alcohol use No    Review of Systems Constitutional: No fever/chills Eyes: No visual changes. ENT: No sore throat. No stiff neck no neck pain Cardiovascular: Denies chest pain. Respiratory: Denies shortness of breath. Gastrointestinal:   no vomiting.  No diarrhea.  No constipation. Genitourinary: Negative for dysuria. Musculoskeletal: Negative lower extremity swelling Skin: Negative for rash. Neurological: Negative for severe headaches, focal weakness or numbness.   ____________________________________________   PHYSICAL EXAM:  VITAL SIGNS: ED Triage Vitals  Enc Vitals Group     BP 04/18/17 1121 (!) 138/55     Pulse Rate 04/18/17 1120 81     Resp 04/18/17 1120 17     Temp 04/18/17 1120 97.8 F (36.6 C)     Temp Source 04/18/17 1120 Oral     SpO2 04/18/17 1120 99 %     Weight 04/18/17 1121 170 lb (77.1 kg)     Height 04/18/17 1121 5\' 4"  (1.626 m)     Head Circumference --      Peak Flow --      Pain Score --      Pain Loc --      Pain Edu? --      Excl. in [place]? --     Constitutional: Alert and oriented. Well appearing and in no acute distress. Eyes: Conjunctivae are  normal Head: Atraumatic HEENT: No congestion/rhinnorhea. Mucous membranes are moist.  Oropharynx non-erythematous Neck:   Nontender with no meningismus, no masses, no stridor Cardiovascular: Normal rate, regular rhythm. Patient is a 2/6 holosystolic heart murmur, (patient states is been there her whole life).  Good peripheral circulation. Respiratory: Normal respiratory effort.  No retractions. Lungs CTAB. Abdominal: Soft and nontender. No distention. No guarding  no rebound Back:  There is no focal tenderness or step off.  there is no midline tenderness there are no lesions noted. there is no CVA tenderness Musculoskeletal: No lower extremity tenderness, no upper extremity tenderness. No joint effusions, no DVT signs strong distal pulses no edema Neurologic: Cranial nerves II through XII are grossly intact 5 out of 5 strength bilateral upper and lower extremity. Finger to nose within normal limits heel to shin within normal limits, speech is normal with no word finding difficulty or dysarthria, reflexes symmetric, pupils are equally round and reactive to light, there is no pronator drift, sensation is normal, vision is intact to confrontation, gait is deferred, there is perhaps 1 or 2 beats of nystagmus otherwise normal neurologic exam  Skin:  Skin is warm, dry and intact. No rash noted. Psychiatric: Mood and affect are normal. Speech and behavior are normal.  ____________________________________________   LABS (all labs ordered are listed, but only abnormal results are displayed)  Labs Reviewed  BASIC METABOLIC PANEL - Abnormal; Notable for the following:       Result Value   Sodium 133 (*)    Chloride 99 (*)    Glucose, Bld 100 (*)    BUN 27 (*)    Creatinine, Ser 1.11 (*)    GFR calc non Af Amer 43 (*)    GFR calc Af Amer 50 (*)    All other components within normal limits  URINALYSIS, COMPLETE (UACMP) WITH MICROSCOPIC - Abnormal; Notable for the following:    Color, Urine YELLOW (*)     APPearance HAZY (*)    Leukocytes, UA MODERATE (*)    Bacteria, UA RARE (*)    Squamous Epithelial / LPF 6-30 (*)    Non Squamous Epithelial 0-5 (*)    All other components within normal limits  CBC  TROPONIN I  HEPATIC FUNCTION PANEL  MAGNESIUM   ____________________________________________  EKG  I personally interpreted any EKGs ordered by me or triage Sinus rhythm, old left bundle-branch block noted, rate 82 bpm ____________________________________________  RADIOLOGY  I reviewed any imaging ordered by me or triage that were performed during my shift and, if possible, patient and/or family made aware of any abnormal findings. ____________________________________________   PROCEDURES  Procedure(s) performed: None  Procedures  Critical Care performed: None  ____________________________________________   INITIAL IMPRESSION / ASSESSMENT AND PLAN / ED COURSE  Pertinent labs & imaging results that were available during my care of the patient were reviewed by me and considered in my medical decision making (see chart for details).  Very vague complaint of nausea for 5 months with associated "all my nerves are firing at once" she is neurologically intact, abdomen is benign, blood work is reassuring, vital signs are reassuring, physical exam is reassuring. Some places as if she may be having some balance issues in addition to this when I talked her further, we will obtain an MR of her head, chest x-ray is also ordered as a precaution. Patient is in no acute distress, we will give her some antinausea medication, she is not actually vomited or lost weight with this. Certainly there could be multiple different indolent processes causing this patient to be nauseated but there doesn't appear to be anything acute that needs to be evaluated in the emergency room assuming that this no CVA. Patient states her symptoms are somewhat worse over the last 2 days. Her abdomen is benign with no  evidence of abdominal pathology. Patient may require further imaging but  not certain has to happen at the emergency department emergently. She is not having any chest patient not having any shortness of breath. Troponin is negative despite symptoms for days and months, nothing to suggest gallbladder disease on exam with normal liver function tests she is eating and drinking with no difficulty has not lost weight etc. This is likely noted take concentrated outpatient evaluation to further discover but we will make sure that her MRI of the head is negative and this is not a centrally mediated nausea from a mass or other pathology in her brain  ----------------------------------------- 3:45 PM on 04/18/2017 -----------------------------------------  Patient is not orthostatic, we will obtain an MRI. Bobbye Riggs is really difficult to know how to attack 5 months of nausea in the emergency department especially nausea without vomiting or weight loss. It could be as an outpatient she will require a CT around and pelvis without think that emergently has to happen in the emergency department. She has no abdominal pain or tenderness, she is eating and drinking and she feels no distress in that area. Chest x-ray is reassuring with no evidence of oncologic pathology. She has no exertional dyspnea or chest pain and her troponin is negative nothing distress of this is acute coronary syndrome going on for 5 months with no dyspnea or chest pain. Nonetheless we did check. Possibly, did elect to do an MRI of her head is is possible she has some cerebellar issues and I am not elucidating with my exam. If that is negative I feel confortable patient can follow up as an outpatient for this pathology.  ----------------------------------------- 4:19 PM on 04/18/2017 -----------------------------------------  Patient remains without symptoms or signs of acute disease she feels much better her eating, she is not nauseated, we will  discharge her with close outpatient follow-up. She will follow closely with primary care. There is always a chance this could be a medication reaction, she is sexually not taking Neurontin even though it is on her med list. However she has had nausea with other medications including Lodine. Nonetheless, she has a very vague symptom which will require extensive outpatient workup to track down and we don't see any acute pathology right now. Return precautions and follow-up given and understood.    ____________________________________________   FINAL CLINICAL IMPRESSION(S) / ED DIAGNOSES  Final diagnoses:  None      This chart was dictated using voice recognition software.  Despite best efforts to proofread,  errors can occur which can change meaning.      Schuyler Amor, MD 04/18/17 1433    Schuyler Amor, MD 04/18/17 1548    Schuyler Amor, MD 04/18/17 1620

## 2017-04-18 NOTE — ED Triage Notes (Addendum)
Pt c/o intermittent dizziness, generalized weakness, and fatigue since yesterday.  Has some petechiae to arms. Alert and oriented currently. NAD. VSS.  Denies any new pain, has some chronic back pain.  "feels like all my nerves are shot". Has had some nausea.

## 2017-04-18 NOTE — ED Notes (Signed)
Patient gone to x-ray family in room

## 2017-04-18 NOTE — Discharge Instructions (Signed)
Return to the emergency room for any new or worrisome symptoms. We are very reassured by all of your findings however, as we discussed, we are unable to identify why you been nauseated for 5 months. It could be made worse by urinary tract infection. Take your antibiotics until they are gone. As we discussed, we cannot definitively tell you why you been feeling nauseated but we are reassured by your findings here. Please follow closely with primary care doctor, cardiology, and gastroenterology and if you feel worse, in any way, as discussed please return to the emergency department

## 2017-04-21 ENCOUNTER — Other Ambulatory Visit: Payer: Self-pay

## 2017-04-21 MED ORDER — ONETOUCH ULTRASOFT LANCETS MISC: 12 refills | Status: DC

## 2017-04-22 DIAGNOSIS — R110 Nausea: Secondary | ICD-10-CM | POA: Diagnosis not present

## 2017-04-25 ENCOUNTER — Ambulatory Visit
Admission: RE | Admit: 2017-04-25 | Discharge: 2017-04-25 | Disposition: A | Discharge status: Home / Self Care | Payer: PPO | Source: Ambulatory Visit | Attending: Anesthesiology | Admitting: Anesthesiology

## 2017-04-25 ENCOUNTER — Other Ambulatory Visit: Payer: Self-pay | Admitting: Anesthesiology

## 2017-04-25 ENCOUNTER — Encounter: Payer: Self-pay | Admitting: Anesthesiology

## 2017-04-25 ENCOUNTER — Ambulatory Visit: Payer: PPO | Attending: Anesthesiology | Admitting: Anesthesiology

## 2017-04-25 VITALS — BP 130/44 | HR 74 | Temp 97.8°F | Resp 16 | Ht 64.0 in | Wt 170.0 lb

## 2017-04-25 DIAGNOSIS — Z7982 Long term (current) use of aspirin: Secondary | ICD-10-CM | POA: Insufficient documentation

## 2017-04-25 DIAGNOSIS — M5442 Lumbago with sciatica, left side: Secondary | ICD-10-CM | POA: Diagnosis not present

## 2017-04-25 DIAGNOSIS — Z8249 Family history of ischemic heart disease and other diseases of the circulatory system: Secondary | ICD-10-CM | POA: Insufficient documentation

## 2017-04-25 DIAGNOSIS — M5432 Sciatica, left side: Secondary | ICD-10-CM

## 2017-04-25 DIAGNOSIS — R52 Pain, unspecified: Secondary | ICD-10-CM | POA: Diagnosis not present

## 2017-04-25 DIAGNOSIS — Z79899 Other long term (current) drug therapy: Secondary | ICD-10-CM | POA: Insufficient documentation

## 2017-04-25 DIAGNOSIS — G8929 Other chronic pain: Secondary | ICD-10-CM | POA: Diagnosis not present

## 2017-04-25 DIAGNOSIS — M4697 Unspecified inflammatory spondylopathy, lumbosacral region: Secondary | ICD-10-CM | POA: Diagnosis not present

## 2017-04-25 DIAGNOSIS — Z794 Long term (current) use of insulin: Secondary | ICD-10-CM | POA: Insufficient documentation

## 2017-04-25 DIAGNOSIS — M545 Low back pain, unspecified: Secondary | ICD-10-CM

## 2017-04-25 DIAGNOSIS — M4687 Other specified inflammatory spondylopathies, lumbosacral region: Secondary | ICD-10-CM | POA: Diagnosis not present

## 2017-04-25 DIAGNOSIS — E119 Type 2 diabetes mellitus without complications: Secondary | ICD-10-CM | POA: Diagnosis not present

## 2017-04-25 DIAGNOSIS — M25552 Pain in left hip: Secondary | ICD-10-CM | POA: Insufficient documentation

## 2017-04-25 DIAGNOSIS — Z981 Arthrodesis status: Secondary | ICD-10-CM | POA: Diagnosis not present

## 2017-04-25 DIAGNOSIS — M47817 Spondylosis without myelopathy or radiculopathy, lumbosacral region: Secondary | ICD-10-CM

## 2017-04-25 DIAGNOSIS — M961 Postlaminectomy syndrome, not elsewhere classified: Secondary | ICD-10-CM

## 2017-04-25 MED ORDER — DEXAMETHASONE SODIUM PHOSPHATE 10 MG/ML IJ SOLN
INTRAMUSCULAR | Status: AC
  Filled 2017-04-25: qty 1

## 2017-04-25 MED ORDER — MIDAZOLAM HCL 5 MG/5ML IJ SOLN
5.0000 mg | Freq: Once | INTRAMUSCULAR | Status: AC
  Administered 2017-04-25: 5 mg via INTRAVENOUS
  Filled 2017-04-25: qty 5

## 2017-04-25 MED ORDER — IOPAMIDOL (ISOVUE-M 200) INJECTION 41%
INTRAMUSCULAR | Status: AC
  Filled 2017-04-25: qty 10

## 2017-04-25 MED ORDER — TRIAMCINOLONE ACETONIDE 40 MG/ML IJ SUSP
40.0000 mg | Freq: Once | INTRAMUSCULAR | Status: AC
  Administered 2017-04-25: 40 mg
  Filled 2017-04-25: qty 1

## 2017-04-25 MED ORDER — LIDOCAINE HCL (PF) 1 % IJ SOLN
5.0000 mL | Freq: Once | INTRAMUSCULAR | Status: AC
  Administered 2017-04-25: 5 mL via SUBCUTANEOUS

## 2017-04-25 MED ORDER — SODIUM CHLORIDE 0.9 % IJ SOLN
INTRAMUSCULAR | Status: AC
  Filled 2017-04-25: qty 10

## 2017-04-25 MED ORDER — LACTATED RINGERS IV SOLN
1000.0000 mL | INTRAVENOUS | Status: DC
  Administered 2017-04-25: 1000 mL via INTRAVENOUS

## 2017-04-25 MED ORDER — TRIAMCINOLONE ACETONIDE 40 MG/ML IJ SUSP
40.0000 mg | Freq: Once | INTRAMUSCULAR | Status: DC
  Filled 2017-04-25: qty 2

## 2017-04-25 MED ORDER — ROPIVACAINE HCL 2 MG/ML IJ SOLN
10.0000 mL | Freq: Once | INTRAMUSCULAR | Status: DC
  Filled 2017-04-25: qty 20

## 2017-04-25 MED ORDER — SODIUM CHLORIDE 0.9% FLUSH
10.0000 mL | Freq: Once | INTRAVENOUS | Status: AC
  Administered 2017-04-25: 10 mL

## 2017-04-25 MED ORDER — LIDOCAINE HCL (PF) 1 % IJ SOLN
INTRAMUSCULAR | Status: AC
  Filled 2017-04-25: qty 5

## 2017-04-25 NOTE — Progress Notes (Signed)
Subjective:  Patient ID: Jasmine Webster, female    DOB: Sep 17, 1926  Age: 83 y.o. MRN: [id number]  CC: Hip Pain (left hip down to the thigh.)   Service Provided on Last Visit: Procedure PROCEDURE:Caudal epidural steroid No. 4under fluoroscopic guidance with moderate sedation And left gluteal trigger point injection 1  HPI Jasmine Webster presents for Reevaluation. She had her last injection in early May and continues to have severe pain with radiation into the left greater than right lower extremity. Unfortunately she is failed to gain any significant improvement and desires to proceed with one additional epidural steroid injection I think this is reasonable under the circumstances. She has been able to avoid significant pain medications at this point however the pain she is experiencing is considered to be intolerable. She has disturbed sleep at night and pain throughout the day and has gained some transient relief with a 25-30% overall improvement in her lower leg pain following the series of epidural steroids in the past. She also has tenderness in the left gluteal region. No change in bowel or bladder function is noted.  By history She is a pleasant 83 year old white female with a long-standing history of low back pain. She describes having had multiple procedures on her low back and neck and has undergone a low thoracic to lower lumbar spinal fusion. She was recently seen by Dr. Earle Gell and subsequently referred to [name] for her sciatica. She experiences this primarily in the left lower back with radiation into the left posterior leg and describes it as a maximum pain score of 6 that does not appear to be influenced by time of day but is worse with prolonged sitting. Alleviating factors include lying down and particular positions to decompress her back seems to help. She does not have any appreciable lower extremity weakness. She occasionally gets some cramping type pain especially in the left  lower back with radiation into the buttocks and hip that is burning and constant. She cannot get any relief at this time and has been on medication management and has gone through conservative therapy without significant improvement. She also has had a previous CT myelogram and I reviewed the results of this today.  History Jasmine Webster has a past medical history of Arthritis; Cataract; Chronic back pain; and Diabetes mellitus without complication ([place]).   She has a past surgical history that includes Back surgery; Appendectomy; Abdominal hysterectomy; Tonsillectomy and adenoidectomy; Cataract extraction; and Thoracolumbar syringo shunt.   Her family history includes Cancer in her brother and brother; Diabetes in her father; Heart disease in her mother and sister; Pneumonia in her father; Stroke in her brother, father, and sister; Sudden death in her brother, sister, and sister.She reports that she has never smoked. She has never used smokeless tobacco. She reports that she does not drink alcohol or use drugs.  No results found for this or any previous visit.  No results found for: TOXASSSELUR  Outpatient Medications Prior to Visit  Medication Sig Dispense Refill  . ALPRAZolam (XANAX) 0.5 MG tablet TAKE ONE TABLET (0.5 MG TOTAL) BY MOUTH AT BEDTIME AS NEEDED 30 tablet 5  . amLODipine (NORVASC) 5 MG tablet TAKE 1 TABLET BY MOUTH EVERY DAY 90 tablet 3  . aspirin 81 MG tablet Take 81 mg by mouth daily.    . cephALEXin (KEFLEX) 500 MG capsule Take 1 capsule (500 mg total) by mouth 3 (three) times daily. 21 capsule 0  . gabapentin (NEURONTIN) 100 MG capsule 200 mg  2 (two) times daily.     Marland Kitchen glucose blood (ONE TOUCH ULTRA TEST) test strip Check sugar twice daily. 100 each 3  . Insulin Glargine (LANTUS SOLOSTAR) 100 UNIT/ML Solostar Pen INJECT 28 UNITS SUBCUTANEOUSLY AT BEDTIME 27 pen 3  . Insulin Pen Needle 31G X 8 MM MISC Check sugar 2 times daily, DX E11.9 (needs ultra fine pen needles) 90 each 12  .  Lancets (ONETOUCH ULTRASOFT) lancets Check sugar twice daily DX E11.9 100 each 12  . Multiple Vitamins-Minerals (PRESERVISION AREDS) CAPS Take 2 capsules by mouth daily.     . Omega-3 Fatty Acids (FISH OIL) 1000 MG CAPS Take 1,000 mg by mouth daily.     . ondansetron (ZOFRAN) 4 MG tablet Take 1 tablet (4 mg total) by mouth every 8 (eight) hours as needed for nausea or vomiting. 8 tablet 0  . quinapril-hydrochlorothiazide (ACCURETIC) 20-12.5 MG tablet TAKE 1 TABLET BY MOUTH TWICE A DAY 180 tablet 3  . ranitidine (ZANTAC) 150 MG tablet Take 1 tablet (150 mg total) by mouth 2 (two) times daily. 60 tablet 12  . sucralfate (CARAFATE) 1 g tablet Take 1 tablet (1 g total) by mouth 4 (four) times daily -  with meals and at bedtime. 120 tablet 12   No facility-administered medications prior to visit.    Lab Results  Component Value Date   WBC 9.1 04/18/2017   HGB 13.7 04/18/2017   HCT 40.2 04/18/2017   PLT 162 04/18/2017   GLUCOSE 100 (H) 04/18/2017   CHOL 300 (H) 02/18/2017   TRIG 172 (H) 02/18/2017   HDL 62 02/18/2017   LDLCALC 204 (H) 02/18/2017   ALT 19 04/18/2017   AST 25 04/18/2017   NA 133 (L) 04/18/2017   K 4.0 04/18/2017   CL 99 (L) 04/18/2017   CREATININE 1.11 (H) 04/18/2017   BUN 27 (H) 04/18/2017   CO2 24 04/18/2017   TSH 5.330 (H) 02/18/2017   HGBA1C 7.5 01/27/2017    --------------------------------------------------------------------------------------------------------------------- Ct Lumbar Spine W Contrast  Result Date: 12/18/2016 CLINICAL DATA:  Back pain EXAM: CT MYELOGRAPHY LUMBAR SPINE TECHNIQUE: CT imaging of the lumbar spine was performed after intrathecal contrast administration. Multiplanar CT image reconstructions were also generated. COMPARISON:  None. FINDINGS: There are bilateral pedicle screws in T12 through S1. There are cross stabilizing bars extending from the thoracic spine to the L4 pedicle screws. There are cross stabilizing bars extending from the L5-S1  pedicle screws. There are disc spacers present at every level except for L1-2 in above. There is solid fusion in the posterior elements from T11 through S1. The study extends from the mid T11 level. There is also solid fusion across the L2-3, L3-4, and L5-S1 discs. Fusion across the L4-5 disc has not yet occurred. Segmentation:  5 lumbar type vertebral bodies. Alignment: Normal. Vertebrae: There is no vertebral compression deformity. Conus medullaris: Extends to the L1-2 disc level and appears normal. Paraspinal and other soft tissues: Aortic atherosclerotic calcification. Disc levels: L1-2: Central canal is widely patent. L2-3:  Central canal is widely patent.  Posterior decompression. L3-4:  Central canal is widely patent.  Posterior decompression. L4-5: Posterior decompression. Central canal is widely patent. Disc osteophytes to encroach upon the foramina bilaterally. This results in mild foraminal narrowing. L5-S1: Central canal is widely patent. Disc osteophytes mildly encroach upon the foramina bilaterally. No obvious narrowing. Posterior decompression. IMPRESSION: Thoracolumbar fusion is solid as described above. This includes solid fusion across the posterior elements at L4-5 without solid fusion across  the disc space. I suspect that the apparent abnormal motion noted on plain radiographs was artifactual and fusion is solid and intact. There is mild foraminal narrowing at L4-5 secondary to disc osteophytes. Electronically Signed   By: Marybelle Killings M.D.   On: 12/18/2016 15:15   Dg Myelography Lumbar Inj Lumbosacral  Result Date: 12/18/2016 CLINICAL DATA:  Back pain.  Lumbar fusion. EXAM: LUMBAR MYELOGRAM FLUOROSCOPY TIME:  37 seconds.  15.6 mGy. PROCEDURE: After thorough discussion of risks and benefits of the procedure including bleeding, infection, injury to nerves, blood vessels, adjacent structures as well as headache and CSF leak, written and oral informed consent was obtained. Consent was obtained  by Dr. Marybelle Killings. Time out form was completed. Patient was positioned prone on the fluoroscopy table. Local anesthesia was provided with 1% lidocaine without epinephrine after prepped and draped in the usual sterile fashion. Puncture was performed at L3-4 using a 3 1/2 inch 22-gauge spinal needle via a midline approach. Using a single pass through the dura, the needle was placed within the thecal sac, with return of clear CSF. 15 mL of Omnipaque-180 was injected into the thecal sac, with normal opacification of the nerve roots and cauda equina consistent with free flow within the subarachnoid space. I personally performed the lumbar puncture and administered the intrathecal contrast. I also personally supervised acquisition of the myelogram images. TECHNIQUE: Contiguous axial images were obtained through the Lumbar spine after the intrathecal infusion of infusion. Coronal and sagittal reconstructions were obtained of the axial image sets. COMPARISON:  None FINDINGS: There are bilateral pedicle screw from L1 through S1 as well as in the lower thoracic spine. Cross stabilizing bars extend from the thoracic spine to the L4 pedicle screws. There are also L5-S1 pedicle screws with cross stabilizing bars. There is no breakage or loosening of the hardware. There is anatomic alignment of the vertebral bodies. There is no vertebral compression deformity. No obvious spinal stenosis is present. A disc spacer is present at L4-5. There is some motion in the disc between flexion and extension views. IMPRESSION: Extensive thoracolumbosacral fusion as described. There is noted to be some degree of motion at the L4-5 level. A disc spacer is present at this level but there is no posterior stabilization. Electronically Signed   By: Marybelle Killings M.D.   On: 12/18/2016 14:38       ---------------------------------------------------------------------------------------------------------------------- Past Medical History:   Diagnosis Date  . Arthritis   . Cataract    cataract removal bilaterally approx 20 years ago  . Chronic back pain   . Diabetes mellitus without complication [facility])     Past Surgical History:  Procedure Laterality Date  . ABDOMINAL HYSTERECTOMY    . APPENDECTOMY    . BACK SURGERY     [age]  . CATARACT EXTRACTION    . THORACOLUMBAR SYRINGO SHUNT    . TONSILLECTOMY AND ADENOIDECTOMY      Family History  Problem Relation Age of Onset  . Heart disease Mother        died from MI  . Heart disease Sister   . Sudden death Brother        shot and beaten to death during home invasion  . Stroke Sister   . Sudden death Sister        MVA  . Cancer Brother        died from lung cancer  . Sudden death Sister        36 days old  . Cancer  Brother        died from lung cancer  . Stroke Brother        cause of death  . Diabetes Father   . Stroke Father   . Pneumonia Father     Social History  Substance Use Topics  . Smoking status: Never Smoker  . Smokeless tobacco: Never Used  . Alcohol use No    ---------------------------------------------------------------------------------------------------------------------  Scheduled Meds: Continuous Infusions: PRN Meds:.   BP (!) 118/50   Pulse 74   Temp 98.1 F (36.7 C)   Resp 16   Ht 5\' 4"  (1.626 m)   Wt 170 lb (77.1 kg)   SpO2 99%   BMI 29.18 kg/m    BP Readings from Last 3 Encounters:  04/25/17 (!) 118/50  04/18/17 (!) 150/61  03/25/17 107/62     Wt Readings from Last 3 Encounters:  04/25/17 170 lb (77.1 kg)  04/18/17 170 lb (77.1 kg)  03/25/17 170 lb (77.1 kg)     ----------------------------------------------------------------------------------------------------------------------  ROS Review of Systems  Cardiac: No angina takes a daily aspirin with a history of a murmur GI: History of ulcers but no hematemesis or hematochezia The remainder of review of systems was negativeAnd without changeOnce  again  Objective:  BP (!) 118/50   Pulse 74   Temp 98.1 F (36.7 C)   Resp 16   Ht 5\' 4"  (1.626 m)   Wt 170 lb (77.1 kg)   SpO2 99%   BMI 29.18 kg/m   Physical Exam Pupils are equally round reactive to light extraocular muscles are intact and the patient is a good historian Heart reveals a 2/6 systolic ejection murmur with regular rhythm and rate Lungs are clear to auscultation without wheeze She has some mild paraspinous muscle lumbar tenderness but With one left gluteal trigger point. Strength is at baseline.     Assessment & Plan:   Emary was seen today for hip pain.  Diagnoses and all orders for this visit:  Facet arthritis of lumbosacral region ([place]) -     FACET INJECTION -     triamcinolone acetonide (KENALOG-40) injection 40 mg; 1 mL (40 mg total) by Other route once. -     sodium chloride flush (NS) 0.9 % injection 10 mL; 10 mLs by Other route once. -     ropivacaine (PF) 2 mg/mL (0.2%) (NAROPIN) injection 10 mL; 10 mLs by Epidural route once. -     midazolam (VERSED) 5 MG/5ML injection 5 mg; Inject 5 mLs (5 mg total) into the vein once. -     lidocaine (PF) (XYLOCAINE) 1 % injection 5 mL; Inject 5 mLs into the skin once. -     lactated ringers infusion 1,000 mL; Inject 1,000 mLs into the vein continuous.  Sciatica of left side -     Lumbar Epidural Injection  Failed back surgical syndrome  Low back pain at multiple sites -     triamcinolone acetonide (KENALOG-40) injection 40 mg; 1 mL (40 mg total) by Other route once.     ----------------------------------------------------------------------------------------------------------------------  Problem List Items Addressed This Visit    None    Visit Diagnoses    Facet arthritis of lumbosacral region [facility])    -  Primary   Relevant Medications   triamcinolone acetonide (KENALOG-40) injection 40 mg   sodium chloride flush (NS) 0.9 % injection 10 mL (Completed)   ropivacaine (PF) 2 mg/mL (0.2%) (NAROPIN)  injection 10 mL   midazolam (VERSED) 5 MG/5ML injection 5  mg (Completed)   lidocaine (PF) (XYLOCAINE) 1 % injection 5 mL (Completed)   lactated ringers infusion 1,000 mL   triamcinolone acetonide (KENALOG-40) injection 40 mg (Completed)   Other Relevant Orders   FACET INJECTION   Sciatica of left side       Relevant Medications   midazolam (VERSED) 5 MG/5ML injection 5 mg (Completed)   Failed back surgical syndrome       Relevant Medications   triamcinolone acetonide (KENALOG-40) injection 40 mg   triamcinolone acetonide (KENALOG-40) injection 40 mg (Completed)   Low back pain at multiple sites       Relevant Medications   triamcinolone acetonide (KENALOG-40) injection 40 mg   triamcinolone acetonide (KENALOG-40) injection 40 mg (Completed)      ----------------------------------------------------------------------------------------------------------------------  1. Failed back surgical syndrome We will proceed with herAn epidural with a low lumbar approach today with return to clinic in approximately  2 months for a lumbar facet injection. 2. Facet arthritis of lumbosacral region [facility]) we have talked about possible diagnostic and therapeutic lumbar facet blocks if she fails conservative and current therapy. She does have pain on extension with left and right lateral rotation but her primary pain complaint remains left-sided. 3. Sciatica of left side As above - Lumbar Epidural Injection; Future  4. Low back pain at multiple sites We have given her prescription for Vicodin but she is not using that. There is a trigger point on the left lateral lumbar region over the posterior superior iliac crest and we will plan on an injection for that today.   ----------------------------------------------------------------------------------------------------------------------  I am having Ms. Mccaffery maintain her gabapentin, PRESERVISION AREDS, aspirin, ALPRAZolam, Fish Oil, ranitidine,  sucralfate, amLODipine, quinapril-hydrochlorothiazide, Insulin Glargine, Insulin Pen Needle, glucose blood, ondansetron, cephALEXin, and onetouch ultrasoft. We administered sodium chloride flush, midazolam, lidocaine (PF), lactated ringers, and triamcinolone acetonide.   Meds ordered this encounter  Medications  . triamcinolone acetonide (KENALOG-40) injection 40 mg  . sodium chloride flush (NS) 0.9 % injection 10 mL  . ropivacaine (PF) 2 mg/mL (0.2%) (NAROPIN) injection 10 mL  . midazolam (VERSED) 5 MG/5ML injection 5 mg  . lidocaine (PF) (XYLOCAINE) 1 % injection 5 mL  . lactated ringers infusion 1,000 mL  . triamcinolone acetonide (KENALOG-40) injection 40 mg    Procedure: Lumbar epidural steroid injection under fluoroscopic guidance with moderate  sedation and left lumbar trigger point injection over the posterior superior iliac crest #1  After informed consent was obtained and the risks benefits reviewed patient chose to pursue a lumbar epidural steroid injection today. With the patient in the prone position and an IV in place, sedation was with IV 1 mg versed. Using AP and lateral fluoroscopic guidance I identified the area overlying the L5-S1 interspace.This area was broadly prepped with DuraPrep 3 and we utilized strict aseptic technique during the procedure. 1% lidocaine was infiltrated 2 cc using a 25-gauge and then  I advanced an 18-gauge Touhy needle with multiple attempts to gain access at L5-S1 without success. This ultimately was aborted without evidence of any trauma. We then proceeded with a caudal approach. This area was widely prepped and once again using strict aseptic technique and AP and lateral FluoroScan I advanced an 18-gauge Touhy needle easily through the sacral hiatus. 1 cc of Isovue was injected yielding good spread and this was followed by a mixture of 1 cc of normal saline 40 mg of times alone and 1 cc of ropivacaine 0.2%. The needle was withdrawn. This was followed by  a 25-gauge needle injection to the trigger point overlying the left posterior superior iliac crest. At this site I injected 6 cc of ropivacaine 0.2% with 10 mg of Decadron in a fanlike distribution and this was well-tolerated the needle was withdrawn and the patient was convalesced discharged home in stable condition for follow-up as mentioned.   Follow-up: Return for procedure.    Molli Barrows, MD 10:34 AM  The  practitioner database for opioid medications on this patient has been reviewed by me and my staff   Greater than 50% of the total encounter time was spent in counseling and / or coordination of care.     This dictation was performed utilizing Systems analyst.  Please excuse any unintentional or mistaken typographical errors as a result.

## 2017-04-25 NOTE — Patient Instructions (Signed)
Pain Management Discharge Instructions  General Discharge Instructions :  If you need to reach your doctor call: Monday-Friday 8:00 am - 4:00 pm at [phone] or toll free [phone].  After clinic hours [phone] to have operator reach doctor.  Bring all of your medication bottles to all your appointments in the pain clinic.  To cancel or reschedule your appointment with Pain Management please remember to call 24 hours in advance to avoid a fee.  Refer to the educational materials which you have been given on: General Risks, I had my Procedure. Discharge Instructions, Post Sedation.  Post Procedure Instructions:  The drugs you were given will stay in your system until tomorrow, so for the next 24 hours you should not drive, make any legal decisions or drink any alcoholic beverages.  You may eat anything you prefer, but it is better to start with liquids then soups and crackers, and gradually work up to solid foods.  Please notify your doctor immediately if you have any unusual bleeding, trouble breathing or pain that is not related to your normal pain.  Depending on the type of procedure that was done, some parts of your body may feel week and/or numb.  This usually clears up by tonight or the next day.  Walk with the use of an assistive device or accompanied by an adult for the 24 hours.  You may use ice on the affected area for the first 24 hours.  Put ice in a Ziploc bag and cover with a towel and place against area 15 minutes on 15 minutes off.  You may switch to heat after 24 hours.GENERAL RISKS AND COMPLICATIONS  What are the risk, side effects and possible complications? Generally speaking, most procedures are safe.  However, with any procedure there are risks, side effects, and the possibility of complications.  The risks and complications are dependent upon the sites that are lesioned, or the type of nerve block to be performed.  The closer the procedure is to the spine,  the more serious the risks are.  Great care is taken when placing the radio frequency needles, block needles or lesioning probes, but sometimes complications can occur. 1. Infection: Any time there is an injection through the skin, there is a risk of infection.  This is why sterile conditions are used for these blocks.  There are four possible types of infection. 1. Localized skin infection. 2. Central Nervous System Infection-This can be in the form of Meningitis, which can be deadly. 3. Epidural Infections-This can be in the form of an epidural abscess, which can cause pressure inside of the spine, causing compression of the spinal cord with subsequent paralysis. This would require an emergency surgery to decompress, and there are no guarantees that the patient would recover from the paralysis. 4. Discitis-This is an infection of the intervertebral discs.  It occurs in about 1% of discography procedures.  It is difficult to treat and it may lead to surgery.        2. Pain: the needles have to go through skin and soft tissues, will cause soreness.       3. Damage to internal structures:  The nerves to be lesioned may be near blood vessels or    other nerves which can be potentially damaged.       4. Bleeding: Bleeding is more common if the patient is taking blood thinners such as  aspirin, Coumadin, Ticiid, Plavix, etc., or if he/she have some genetic predisposition  such as   hemophilia. Bleeding into the spinal canal can cause compression of the spinal  cord with subsequent paralysis.  This would require an emergency surgery to  decompress and there are no guarantees that the patient would recover from the  paralysis.       5. Pneumothorax:  Puncturing of a lung is a possibility, every time a needle is introduced in  the area of the chest or upper back.  Pneumothorax refers to free air around the  collapsed lung(s), inside of the thoracic cavity (chest cavity).  Another two possible  complications  related to a similar event would include: Hemothorax and Chylothorax.   These are variations of the Pneumothorax, where instead of air around the collapsed  lung(s), you may have blood or chyle, respectively.       6. Spinal headaches: They may occur with any procedures in the area of the spine.       7. Persistent CSF (Cerebro-Spinal Fluid) leakage: This is a rare problem, but may occur  with prolonged intrathecal or epidural catheters either due to the formation of a fistulous  track or a dural tear.       8. Nerve damage: By working so close to the spinal cord, there is always a possibility of  nerve damage, which could be as serious as a permanent spinal cord injury with  paralysis.       9. Death:  Although rare, severe deadly allergic reactions known as "Anaphylactic  reaction" can occur to any of the medications used.      10. Worsening of the symptoms:  We can always make thing worse.  What are the chances of something like this happening? Chances of any of this occuring are extremely low.  By statistics, you have more of a chance of getting killed in a motor vehicle accident: while driving to the hospital than any of the above occurring .  Nevertheless, you should be aware that they are possibilities.  In general, it is similar to taking a shower.  Everybody knows that you can slip, hit your head and get killed.  Does that mean that you should not shower again?  Nevertheless always keep in mind that statistics do not mean anything if you happen to be on the wrong side of them.  Even if a procedure has a 1 (one) in a 1,000,000 (million) chance of going wrong, it you happen to be that one..Also, keep in mind that by statistics, you have more of a chance of having something go wrong when taking medications.  Who should not have this procedure? If you are on a blood thinning medication (e.g. Coumadin, Plavix, see list of "Blood Thinners"), or if you have an active infection going on, you should not  have the procedure.  If you are taking any blood thinners, please inform your physician.  How should I prepare for this procedure?  Do not eat or drink anything at least six hours prior to the procedure.  Bring a driver with you .  It cannot be a taxi.  Come accompanied by an adult that can drive you back, and that is strong enough to help you if your legs get weak or numb from the local anesthetic.  Take all of your medicines the morning of the procedure with just enough water to swallow them.  If you have diabetes, make sure that you are scheduled to have your procedure done first thing in the morning, whenever possible.  If you have diabetes,   take only half of your insulin dose and notify our nurse that you have done so as soon as you arrive at the clinic.  If you are diabetic, but only take blood sugar pills (oral hypoglycemic), then do not take them on the morning of your procedure.  You may take them after you have had the procedure.  Do not take aspirin or any aspirin-containing medications, at least eleven (11) days prior to the procedure.  They may prolong bleeding.  Wear loose fitting clothing that may be easy to take off and that you would not mind if it got stained with Betadine or blood.  Do not wear any jewelry or perfume  Remove any nail coloring.  It will interfere with some of our monitoring equipment.  NOTE: Remember that this is not meant to be interpreted as a complete list of all possible complications.  Unforeseen problems may occur.  BLOOD THINNERS The following drugs contain aspirin or other products, which can cause increased bleeding during surgery and should not be taken for 2 weeks prior to and 1 week after surgery.  If you should need take something for relief of minor pain, you may take acetaminophen which is found in Tylenol,m Datril, Anacin-3 and Panadol. It is not blood thinner. The products listed below are.  Do not take any of the products listed below  in addition to any listed on your instruction sheet.  A.P.C or A.P.C with Codeine Codeine Phosphate Capsules #3 Ibuprofen Ridaura  ABC compound Congesprin Imuran rimadil  Advil Cope Indocin Robaxisal  Alka-Seltzer Effervescent Pain Reliever and Antacid Coricidin or Coricidin-D  Indomethacin Rufen  Alka-Seltzer plus Cold Medicine Cosprin Ketoprofen S-A-C Tablets  Anacin Analgesic Tablets or Capsules Coumadin Korlgesic Salflex  Anacin Extra Strength Analgesic tablets or capsules CP-2 Tablets Lanoril Salicylate  Anaprox Cuprimine Capsules Levenox Salocol  Anexsia-D Dalteparin Magan Salsalate  Anodynos Darvon compound Magnesium Salicylate Sine-off  Ansaid Dasin Capsules Magsal Sodium Salicylate  Anturane Depen Capsules Marnal Soma  APF Arthritis pain formula Dewitt's Pills Measurin Stanback  Argesic Dia-Gesic Meclofenamic Sulfinpyrazone  Arthritis Bayer Timed Release Aspirin Diclofenac Meclomen Sulindac  Arthritis pain formula Anacin Dicumarol Medipren Supac  Analgesic (Safety coated) Arthralgen Diffunasal Mefanamic Suprofen  Arthritis Strength Bufferin Dihydrocodeine Mepro Compound Suprol  Arthropan liquid Dopirydamole Methcarbomol with Aspirin Synalgos  ASA tablets/Enseals Disalcid Micrainin Tagament  Ascriptin Doan's Midol Talwin  Ascriptin A/D Dolene Mobidin Tanderil  Ascriptin Extra Strength Dolobid Moblgesic Ticlid  Ascriptin with Codeine Doloprin or Doloprin with Codeine Momentum Tolectin  Asperbuf Duoprin Mono-gesic Trendar  Aspergum Duradyne Motrin or Motrin IB Triminicin  Aspirin plain, buffered or enteric coated Durasal Myochrisine Trigesic  Aspirin Suppositories Easprin Nalfon Trillsate  Aspirin with Codeine Ecotrin Regular or Extra Strength Naprosyn Uracel  Atromid-S Efficin Naproxen Ursinus  Auranofin Capsules Elmiron Neocylate Vanquish  Axotal Emagrin Norgesic Verin  Azathioprine Empirin or Empirin with Codeine Normiflo Vitamin E  Azolid Emprazil Nuprin Voltaren  Bayer  Aspirin plain, buffered or children's or timed BC Tablets or powders Encaprin Orgaran Warfarin Sodium  Buff-a-Comp Enoxaparin Orudis Zorpin  Buff-a-Comp with Codeine Equegesic Os-Cal-Gesic   Buffaprin Excedrin plain, buffered or Extra Strength Oxalid   Bufferin Arthritis Strength Feldene Oxphenbutazone   Bufferin plain or Extra Strength Feldene Capsules Oxycodone with Aspirin   Bufferin with Codeine Fenoprofen Fenoprofen Pabalate or Pabalate-SF   Buffets II Flogesic Panagesic   Buffinol plain or Extra Strength Florinal or Florinal with Codeine Panwarfarin   Buf-Tabs Flurbiprofen Penicillamine   Butalbital Compound Four-way cold tablets   Penicillin   Butazolidin Fragmin Pepto-Bismol   Carbenicillin Geminisyn Percodan   Carna Arthritis Reliever Geopen Persantine   Carprofen Gold's salt Persistin   Chloramphenicol Goody's Phenylbutazone   Chloromycetin Haltrain Piroxlcam   Clmetidine heparin Plaquenil   Cllnoril Hyco-pap Ponstel   Clofibrate Hydroxy chloroquine Propoxyphen         Before stopping any of these medications, be sure to consult the physician who ordered them.  Some, such as Coumadin (Warfarin) are ordered to prevent or treat serious conditions such as "deep thrombosis", "pumonary embolisms", and other heart problems.  The amount of time that you may need off of the medication may also vary with the medication and the reason for which you were taking it.  If you are taking any of these medications, please make sure you notify your pain physician before you undergo any procedures.         Epidural Steroid Injection Patient Information  Description: The epidural space surrounds the nerves as they exit the spinal cord.  In some patients, the nerves can be compressed and inflamed by a bulging disc or a tight spinal canal (spinal stenosis).  By injecting steroids into the epidural space, we can bring irritated nerves into direct contact with a potentially helpful medication.   These steroids act directly on the irritated nerves and can reduce swelling and inflammation which often leads to decreased pain.  Epidural steroids may be injected anywhere along the spine and from the neck to the low back depending upon the location of your pain.   After numbing the skin with local anesthetic (like Novocaine), a small needle is passed into the epidural space slowly.  You may experience a sensation of pressure while this is being done.  The entire block usually last less than 10 minutes.  Conditions which may be treated by epidural steroids:   Low back and leg pain  Neck and arm pain  Spinal stenosis  Post-laminectomy syndrome  Herpes zoster (shingles) pain  Pain from compression fractures  Preparation for the injection:  1. Do not eat any solid food or dairy products within 8 hours of your appointment.  2. You may drink clear liquids up to 3 hours before appointment.  Clear liquids include water, black coffee, juice or soda.  No milk or cream please. 3. You may take your regular medication, including pain medications, with a sip of water before your appointment  Diabetics should hold regular insulin (if taken separately) and take 1/2 normal NPH dos the morning of the procedure.  Carry some sugar containing items with you to your appointment. 4. A driver must accompany you and be prepared to drive you home after your procedure.  5. Bring all your current medications with your. 6. An IV may be inserted and sedation may be given at the discretion of the physician.   7. A blood pressure cuff, EKG and other monitors will often be applied during the procedure.  Some patients may need to have extra oxygen administered for a short period. 8. You will be asked to provide medical information, including your allergies, prior to the procedure.  We must know immediately if you are taking blood thinners (like Coumadin/Warfarin)  Or if you are allergic to IV iodine contrast (dye). We  must know if you could possible be pregnant.  Possible side-effects:  Bleeding from needle site  Infection (rare, may require surgery)  Nerve injury (rare)  Numbness & tingling (temporary)  Difficulty urinating (rare, temporary)  Spinal headache (   a headache worse with upright posture)  Light -headedness (temporary)  Pain at injection site (several days)  Decreased blood pressure (temporary)  Weakness in arm/leg (temporary)  Pressure sensation in back/neck (temporary)  Call if you experience:  Fever/chills associated with headache or increased back/neck pain.  Headache worsened by an upright position.  New onset weakness or numbness of an extremity below the injection site  Hives or difficulty breathing (go to the emergency room)  Inflammation or drainage at the infection site  Severe back/neck pain  Any new symptoms which are concerning to you  Please note:  Although the local anesthetic injected can often make your back or neck feel good for several hours after the injection, the pain will likely return.  It takes 3-7 days for steroids to work in the epidural space.  You may not notice any pain relief for at least that one week.  If effective, we will often do a series of three injections spaced 3-6 weeks apart to maximally decrease your pain.  After the initial series, we generally will wait several months before considering a repeat injection of the same type.  If you have any questions, please call [phone] [facility] 

## 2017-04-25 NOTE — Progress Notes (Signed)
Safety precautions to be maintained throughout the outpatient stay will include: orient to surroundings, keep bed in low position, maintain call bell within reach at all times, provide assistance with transfer out of bed and ambulation.  

## 2017-04-28 ENCOUNTER — Telehealth: Payer: Self-pay | Admitting: *Deleted

## 2017-04-28 NOTE — Telephone Encounter (Signed)
No problems post procedure. 

## 2017-04-29 DIAGNOSIS — E1142 Type 2 diabetes mellitus with diabetic polyneuropathy: Secondary | ICD-10-CM | POA: Diagnosis not present

## 2017-04-29 DIAGNOSIS — L851 Acquired keratosis [keratoderma] palmaris et plantaris: Secondary | ICD-10-CM | POA: Diagnosis not present

## 2017-04-29 DIAGNOSIS — B351 Tinea unguium: Secondary | ICD-10-CM | POA: Diagnosis not present

## 2017-05-16 ENCOUNTER — Ambulatory Visit: Payer: PPO | Admitting: Anesthesiology

## 2017-05-30 DIAGNOSIS — I10 Essential (primary) hypertension: Secondary | ICD-10-CM | POA: Diagnosis not present

## 2017-05-30 DIAGNOSIS — R011 Cardiac murmur, unspecified: Secondary | ICD-10-CM | POA: Diagnosis not present

## 2017-05-30 DIAGNOSIS — E7800 Pure hypercholesterolemia, unspecified: Secondary | ICD-10-CM | POA: Diagnosis not present

## 2017-06-02 ENCOUNTER — Ambulatory Visit (INDEPENDENT_AMBULATORY_CARE_PROVIDER_SITE_OTHER): Payer: PPO | Admitting: Family Medicine

## 2017-06-02 VITALS — HR 74 | Temp 98.1°F | Resp 12 | Wt 173.0 lb

## 2017-06-02 DIAGNOSIS — R238 Other skin changes: Secondary | ICD-10-CM | POA: Diagnosis not present

## 2017-06-02 DIAGNOSIS — K219 Gastro-esophageal reflux disease without esophagitis: Secondary | ICD-10-CM | POA: Diagnosis not present

## 2017-06-02 DIAGNOSIS — E7800 Pure hypercholesterolemia, unspecified: Secondary | ICD-10-CM

## 2017-06-02 DIAGNOSIS — E119 Type 2 diabetes mellitus without complications: Secondary | ICD-10-CM | POA: Diagnosis not present

## 2017-06-02 DIAGNOSIS — Z794 Long term (current) use of insulin: Secondary | ICD-10-CM | POA: Diagnosis not present

## 2017-06-02 DIAGNOSIS — I10 Essential (primary) hypertension: Secondary | ICD-10-CM

## 2017-06-02 DIAGNOSIS — R233 Spontaneous ecchymoses: Secondary | ICD-10-CM

## 2017-06-02 DIAGNOSIS — F419 Anxiety disorder, unspecified: Secondary | ICD-10-CM | POA: Diagnosis not present

## 2017-06-02 DIAGNOSIS — R7989 Other specified abnormal findings of blood chemistry: Secondary | ICD-10-CM

## 2017-06-02 DIAGNOSIS — R946 Abnormal results of thyroid function studies: Secondary | ICD-10-CM | POA: Diagnosis not present

## 2017-06-02 LAB — POCT UA - MICROALBUMIN: MICROALBUMIN (UR) POC: 20 mg/L

## 2017-06-02 LAB — POCT GLYCOSYLATED HEMOGLOBIN (HGB A1C): Hemoglobin A1C: 6.9

## 2017-06-02 MED ORDER — EZETIMIBE 10 MG PO TABS: 10.0000 mg | ORAL_TABLET | Freq: Every day | ORAL | 3 refills | Status: DC

## 2017-06-02 MED ORDER — ALPRAZOLAM 0.5 MG PO TABS: ORAL_TABLET | ORAL | 5 refills | Status: DC

## 2017-06-02 NOTE — Progress Notes (Signed)
Jasmine Webster  MRN: [id number] DOB: 1926-08-07  Subjective:  HPI  Patient is here for routine check up. HTN: patient is not checking her b/p. No chest pain or tightness.  BP Readings from Last 3 Encounters:  04/25/17 (!) 130/44  04/18/17 (!) 150/61  03/25/17 107/62   DM: patient is checking her sugar once a day. Sugars have been higher this week around 150s. No hypoglycemic episodes. No numbness or tingling sensation present. Lab Results  Component Value Date   HGBA1C 7.5 01/27/2017   Wt Readings from Last 3 Encounters:  06/02/17 173 lb (78.5 kg)  04/25/17 170 lb (77.1 kg)  04/18/17 170 lb (77.1 kg)   Patient needs Xanax refilled, last fill was in January with 5 refills. Patient takes this 1 tablet daily. Last lab work was done in April. TSH level was slightly abnormal and need to have this level re checked today. Patient is not on thyroid medication. Lab Results  Component Value Date   TSH 5.330 (H) 02/18/2017    Patient Active Problem List   Diagnosis Date Noted  . Anxiety 03/23/2015  . Arthritis 03/23/2015  . Cardiovascular disease 03/23/2015  . Urinary system disease 03/23/2015  . Malignant neoplasm of corpus uteri, except isthmus ([place]) 03/23/2015  . Essential (primary) hypertension 03/23/2015  . Acid reflux 03/23/2015  . Cardiac murmur 03/23/2015  . Hypercholesteremia 03/23/2015  . Adult hypothyroidism 03/23/2015  . Cannot sleep 03/23/2015  . Malaise and fatigue 03/23/2015  . Muscle ache 03/23/2015  . Arthritis, degenerative 03/23/2015  . Diabetes mellitus, type 2 ([place]) 03/23/2015    Past Medical History:  Diagnosis Date  . Arthritis   . Cataract    cataract removal bilaterally approx 20 years ago  . Chronic back pain   . Diabetes mellitus without complication [facility])     Social History   Social History  . Marital status: Widowed    Spouse name: N/A  . Number of children: N/A  . Years of education: N/A   Occupational History  . Not on file.     Social History Main Topics  . Smoking status: Never Smoker  . Smokeless tobacco: Never Used  . Alcohol use No  . Drug use: No  . Sexual activity: No   Other Topics Concern  . Not on file   Social History Narrative  . No narrative on file    Outpatient Encounter Prescriptions as of 06/02/2017  Medication Sig  . ALPRAZolam (XANAX) 0.5 MG tablet TAKE ONE TABLET (0.5 MG TOTAL) BY MOUTH AT BEDTIME AS NEEDED  . amLODipine (NORVASC) 5 MG tablet TAKE 1 TABLET BY MOUTH EVERY DAY  . aspirin 81 MG tablet Take 81 mg by mouth daily.  Marland Kitchen gabapentin (NEURONTIN) 100 MG capsule 200 mg 2 (two) times daily.   Marland Kitchen glucose blood (ONE TOUCH ULTRA TEST) test strip Check sugar twice daily.  . Insulin Glargine (LANTUS SOLOSTAR) 100 UNIT/ML Solostar Pen INJECT 28 UNITS SUBCUTANEOUSLY AT BEDTIME  . Insulin Pen Needle 31G X 8 MM MISC Check sugar 2 times daily, DX E11.9 (needs ultra fine pen needles)  . Lancets (ONETOUCH ULTRASOFT) lancets Check sugar twice daily DX E11.9  . Multiple Vitamins-Minerals (PRESERVISION AREDS) CAPS Take 2 capsules by mouth daily.   . Omega-3 Fatty Acids (FISH OIL) 1000 MG CAPS Take 1,000 mg by mouth daily.   . ondansetron (ZOFRAN) 4 MG tablet Take 1 tablet (4 mg total) by mouth every 8 (eight) hours as needed for nausea or vomiting.  Marland Kitchen  quinapril-hydrochlorothiazide (ACCURETIC) 20-12.5 MG tablet TAKE 1 TABLET BY MOUTH TWICE A DAY  . ranitidine (ZANTAC) 150 MG tablet Take 1 tablet (150 mg total) by mouth 2 (two) times daily.  Marland Kitchen tobramycin-dexamethasone (TOBRADEX) ophthalmic solution tobramycin 0.3 %-dexamethasone 0.1 % eye drops,suspension  . [DISCONTINUED] sucralfate (CARAFATE) 1 g tablet Take 1 tablet (1 g total) by mouth 4 (four) times daily -  with meals and at bedtime.   No facility-administered encounter medications on file as of 06/02/2017.     Allergies  Allergen Reactions  . Lodine [Etodolac] Nausea Only and Other (See Comments)    Dizziness, too  . Naproxen Other (See  Comments)    Gave her an ulcer after taking it for years  . Statins Other (See Comments)    No energy and myalgias  . Lamisil [Terbinafine Hcl] Other (See Comments)    "Maybe made me nervous"  . Latex Rash    Review of Systems  Constitutional: Negative.   Respiratory: Negative.   Cardiovascular: Negative.   Gastrointestinal: Negative.        Controlled on the medication.  Musculoskeletal: Positive for back pain. Negative for falls.  Neurological: Negative.   Endo/Heme/Allergies: Bruises/bleeds easily.  Psychiatric/Behavioral: The patient is nervous/anxious (stable on the medication).     Objective:  Pulse 74   Temp 98.1 F (36.7 C)   Resp 12   Wt 173 lb (78.5 kg)   BMI 29.70 kg/m   Physical Exam  Constitutional: She is oriented to person, place, and time and well-developed, well-nourished, and in no distress.  HENT:  Head: Normocephalic and atraumatic.  Right Ear: External ear normal.  Left Ear: External ear normal.  Nose: Nose normal.  Eyes: Pupils are equal, round, and reactive to light. Conjunctivae are normal.  Neck: Normal range of motion. Neck supple.  Cardiovascular: Normal rate, regular rhythm and intact distal pulses.  Exam reveals no gallop.   Murmur heard.  Systolic murmur is present with a grade of 2/6  Pulmonary/Chest: Effort normal and breath sounds normal. No respiratory distress. She has no wheezes.  Musculoskeletal: She exhibits no edema or tenderness.  Using cane for ambulation  Neurological: She is alert and oriented to person, place, and time. GCS score is 15.  Skin: Skin is warm and dry.  Psychiatric: Mood, memory, affect and judgment normal.   Assessment and Plan :  1. Gastroesophageal reflux disease, esophagitis presence not specified Stable on treatment.  2. Type 2 diabetes mellitus without complication, with long-term current use of insulin (HCC) A1C 6.9. Stable. - POCT HgB A1C - POCT UA - Microalbumin  3. Hypercholesteremia Start  Zetia, patient can not tolerate statins. Re check levels on next visit. - ezetimibe (ZETIA) 10 MG tablet; Take 1 tablet (10 mg total) by mouth daily.  Dispense: 90 tablet; Refill: 3  4. Essential (primary) hypertension Stable.  5. Abnormal thyroid blood test Re check level today. - TSH  6. Anxiety Refill given, stable. - ALPRAZolam (XANAX) 0.5 MG tablet; TAKE ONE TABLET (0.5 MG TOTAL) BY MOUTH AT BEDTIME AS NEEDED  Dispense: 30 tablet; Refill: 5  7. Easy bruising Advised patient to stop Aspirin for a few days till weekend. Follow as needed. 8.[place] Heart Disease Echo pending per Dr Ubaldo Glassing.  HPI, Exam and A&P transcribed by Theressa Millard, RMA under direction and in the presence of Miguel Aschoff, MD. I have done the exam and reviewed the chart and it is accurate to the best of my knowledge. Development worker, community has  been used and  any errors in dictation or transcription are unintentional. Miguel Aschoff M.D. [facility]

## 2017-06-10 DIAGNOSIS — R946 Abnormal results of thyroid function studies: Secondary | ICD-10-CM | POA: Diagnosis not present

## 2017-06-11 LAB — TSH: TSH: 2.22 u[IU]/mL (ref 0.450–4.500)

## 2017-06-23 ENCOUNTER — Ambulatory Visit: Payer: PPO | Attending: Anesthesiology | Admitting: Anesthesiology

## 2017-06-23 ENCOUNTER — Encounter: Payer: Self-pay | Admitting: Anesthesiology

## 2017-06-23 VITALS — BP 171/85 | HR 96 | Temp 97.8°F | Resp 16 | Ht 64.0 in | Wt 167.0 lb

## 2017-06-23 DIAGNOSIS — Z823 Family history of stroke: Secondary | ICD-10-CM | POA: Insufficient documentation

## 2017-06-23 DIAGNOSIS — M545 Low back pain, unspecified: Secondary | ICD-10-CM

## 2017-06-23 DIAGNOSIS — G8929 Other chronic pain: Secondary | ICD-10-CM | POA: Diagnosis not present

## 2017-06-23 DIAGNOSIS — E119 Type 2 diabetes mellitus without complications: Secondary | ICD-10-CM | POA: Diagnosis not present

## 2017-06-23 DIAGNOSIS — Z7982 Long term (current) use of aspirin: Secondary | ICD-10-CM | POA: Diagnosis not present

## 2017-06-23 DIAGNOSIS — Z794 Long term (current) use of insulin: Secondary | ICD-10-CM | POA: Diagnosis not present

## 2017-06-23 DIAGNOSIS — M5432 Sciatica, left side: Secondary | ICD-10-CM | POA: Diagnosis not present

## 2017-06-23 DIAGNOSIS — Z801 Family history of malignant neoplasm of trachea, bronchus and lung: Secondary | ICD-10-CM | POA: Diagnosis not present

## 2017-06-23 DIAGNOSIS — Z8249 Family history of ischemic heart disease and other diseases of the circulatory system: Secondary | ICD-10-CM | POA: Diagnosis not present

## 2017-06-23 DIAGNOSIS — M1990 Unspecified osteoarthritis, unspecified site: Secondary | ICD-10-CM | POA: Insufficient documentation

## 2017-06-23 DIAGNOSIS — Z9849 Cataract extraction status, unspecified eye: Secondary | ICD-10-CM | POA: Diagnosis not present

## 2017-06-23 DIAGNOSIS — M5442 Lumbago with sciatica, left side: Secondary | ICD-10-CM | POA: Diagnosis not present

## 2017-06-23 DIAGNOSIS — Z90710 Acquired absence of both cervix and uterus: Secondary | ICD-10-CM | POA: Insufficient documentation

## 2017-06-23 DIAGNOSIS — H269 Unspecified cataract: Secondary | ICD-10-CM | POA: Insufficient documentation

## 2017-06-23 DIAGNOSIS — Z833 Family history of diabetes mellitus: Secondary | ICD-10-CM | POA: Insufficient documentation

## 2017-06-23 DIAGNOSIS — M48062 Spinal stenosis, lumbar region with neurogenic claudication: Secondary | ICD-10-CM | POA: Insufficient documentation

## 2017-06-23 DIAGNOSIS — Z9889 Other specified postprocedural states: Secondary | ICD-10-CM | POA: Diagnosis not present

## 2017-06-23 MED ORDER — HYDROCODONE-ACETAMINOPHEN 5-325 MG PO TABS: 1.0000 | ORAL_TABLET | Freq: Four times a day (QID) | ORAL | 0 refills | Status: DC | PRN

## 2017-06-23 NOTE — Patient Instructions (Signed)
GENERAL RISKS AND COMPLICATIONS  What are the risk, side effects and possible complications? Generally speaking, most procedures are safe.  However, with any procedure there are risks, side effects, and the possibility of complications.  The risks and complications are dependent upon the sites that are lesioned, or the type of nerve block to be performed.  The closer the procedure is to the spine, the more serious the risks are.  Great care is taken when placing the radio frequency needles, block needles or lesioning probes, but sometimes complications can occur. 1. Infection: Any time there is an injection through the skin, there is a risk of infection.  This is why sterile conditions are used for these blocks.  There are four possible types of infection. 1. Localized skin infection. 2. Central Nervous System Infection-This can be in the form of Meningitis, which can be deadly. 3. Epidural Infections-This can be in the form of an epidural abscess, which can cause pressure inside of the spine, causing compression of the spinal cord with subsequent paralysis. This would require an emergency surgery to decompress, and there are no guarantees that the patient would recover from the paralysis. 4. Discitis-This is an infection of the intervertebral discs.  It occurs in about 1% of discography procedures.  It is difficult to treat and it may lead to surgery.        2. Pain: the needles have to go through skin and soft tissues, will cause soreness.       3. Damage to internal structures:  The nerves to be lesioned may be near blood vessels or    other nerves which can be potentially damaged.       4. Bleeding: Bleeding is more common if the patient is taking blood thinners such as  aspirin, Coumadin, Ticiid, Plavix, etc., or if he/she have some genetic predisposition  such as hemophilia. Bleeding into the spinal canal can cause compression of the spinal  cord with subsequent paralysis.  This would require an  emergency surgery to  decompress and there are no guarantees that the patient would recover from the  paralysis.       5. Pneumothorax:  Puncturing of a lung is a possibility, every time a needle is introduced in  the area of the chest or upper back.  Pneumothorax refers to free air around the  collapsed lung(s), inside of the thoracic cavity (chest cavity).  Another two possible  complications related to a similar event would include: Hemothorax and Chylothorax.   These are variations of the Pneumothorax, where instead of air around the collapsed  lung(s), you may have blood or chyle, respectively.       6. Spinal headaches: They may occur with any procedures in the area of the spine.       7. Persistent CSF (Cerebro-Spinal Fluid) leakage: This is a rare problem, but may occur  with prolonged intrathecal or epidural catheters either due to the formation of a fistulous  track or a dural tear.       8. Nerve damage: By working so close to the spinal cord, there is always a possibility of  nerve damage, which could be as serious as a permanent spinal cord injury with  paralysis.       9. Death:  Although rare, severe deadly allergic reactions known as "Anaphylactic  reaction" can occur to any of the medications used.      10. Worsening of the symptoms:  We can always make thing worse.    What are the chances of something like this happening? Chances of any of this occuring are extremely low.  By statistics, you have more of a chance of getting killed in a motor vehicle accident: while driving to the hospital than any of the above occurring .  Nevertheless, you should be aware that they are possibilities.  In general, it is similar to taking a shower.  Everybody knows that you can slip, hit your head and get killed.  Does that mean that you should not shower again?  Nevertheless always keep in mind that statistics do not mean anything if you happen to be on the wrong side of them.  Even if a procedure has a 1  (one) in a 1,000,000 (million) chance of going wrong, it you happen to be that one..Also, keep in mind that by statistics, you have more of a chance of having something go wrong when taking medications.  Who should not have this procedure? If you are on a blood thinning medication (e.g. Coumadin, Plavix, see list of "Blood Thinners"), or if you have an active infection going on, you should not have the procedure.  If you are taking any blood thinners, please inform your physician.  How should I prepare for this procedure?  Do not eat or drink anything at least six hours prior to the procedure.  Bring a driver with you .  It cannot be a taxi.  Come accompanied by an adult that can drive you back, and that is strong enough to help you if your legs get weak or numb from the local anesthetic.  Take all of your medicines the morning of the procedure with just enough water to swallow them.  If you have diabetes, make sure that you are scheduled to have your procedure done first thing in the morning, whenever possible.  If you have diabetes, take only half of your insulin dose and notify our nurse that you have done so as soon as you arrive at the clinic.  If you are diabetic, but only take blood sugar pills (oral hypoglycemic), then do not take them on the morning of your procedure.  You may take them after you have had the procedure.  Do not take aspirin or any aspirin-containing medications, at least eleven (11) days prior to the procedure.  They may prolong bleeding.  Wear loose fitting clothing that may be easy to take off and that you would not mind if it got stained with Betadine or blood.  Do not wear any jewelry or perfume  Remove any nail coloring.  It will interfere with some of our monitoring equipment.  NOTE: Remember that this is not meant to be interpreted as a complete list of all possible complications.  Unforeseen problems may occur.  BLOOD THINNERS The following drugs  contain aspirin or other products, which can cause increased bleeding during surgery and should not be taken for 2 weeks prior to and 1 week after surgery.  If you should need take something for relief of minor pain, you may take acetaminophen which is found in Tylenol,m Datril, Anacin-3 and Panadol. It is not blood thinner. The products listed below are.  Do not take any of the products listed below in addition to any listed on your instruction sheet.  A.P.C or A.P.C with Codeine Codeine Phosphate Capsules #3 Ibuprofen Ridaura  ABC compound Congesprin Imuran rimadil  Advil Cope Indocin Robaxisal  Alka-Seltzer Effervescent Pain Reliever and Antacid Coricidin or Coricidin-D  Indomethacin Rufen    Alka-Seltzer plus Cold Medicine Cosprin Ketoprofen S-A-C Tablets  Anacin Analgesic Tablets or Capsules Coumadin Korlgesic Salflex  Anacin Extra Strength Analgesic tablets or capsules CP-2 Tablets Lanoril Salicylate  Anaprox Cuprimine Capsules Levenox Salocol  Anexsia-D Dalteparin Magan Salsalate  Anodynos Darvon compound Magnesium Salicylate Sine-off  Ansaid Dasin Capsules Magsal Sodium Salicylate  Anturane Depen Capsules Marnal Soma  APF Arthritis pain formula Dewitt's Pills Measurin Stanback  Argesic Dia-Gesic Meclofenamic Sulfinpyrazone  Arthritis Bayer Timed Release Aspirin Diclofenac Meclomen Sulindac  Arthritis pain formula Anacin Dicumarol Medipren Supac  Analgesic (Safety coated) Arthralgen Diffunasal Mefanamic Suprofen  Arthritis Strength Bufferin Dihydrocodeine Mepro Compound Suprol  Arthropan liquid Dopirydamole Methcarbomol with Aspirin Synalgos  ASA tablets/Enseals Disalcid Micrainin Tagament  Ascriptin Doan's Midol Talwin  Ascriptin A/D Dolene Mobidin Tanderil  Ascriptin Extra Strength Dolobid Moblgesic Ticlid  Ascriptin with Codeine Doloprin or Doloprin with Codeine Momentum Tolectin  Asperbuf Duoprin Mono-gesic Trendar  Aspergum Duradyne Motrin or Motrin IB Triminicin  Aspirin  plain, buffered or enteric coated Durasal Myochrisine Trigesic  Aspirin Suppositories Easprin Nalfon Trillsate  Aspirin with Codeine Ecotrin Regular or Extra Strength Naprosyn Uracel  Atromid-S Efficin Naproxen Ursinus  Auranofin Capsules Elmiron Neocylate Vanquish  Axotal Emagrin Norgesic Verin  Azathioprine Empirin or Empirin with Codeine Normiflo Vitamin E  Azolid Emprazil Nuprin Voltaren  Bayer Aspirin plain, buffered or children's or timed BC Tablets or powders Encaprin Orgaran Warfarin Sodium  Buff-a-Comp Enoxaparin Orudis Zorpin  Buff-a-Comp with Codeine Equegesic Os-Cal-Gesic   Buffaprin Excedrin plain, buffered or Extra Strength Oxalid   Bufferin Arthritis Strength Feldene Oxphenbutazone   Bufferin plain or Extra Strength Feldene Capsules Oxycodone with Aspirin   Bufferin with Codeine Fenoprofen Fenoprofen Pabalate or Pabalate-SF   Buffets II Flogesic Panagesic   Buffinol plain or Extra Strength Florinal or Florinal with Codeine Panwarfarin   Buf-Tabs Flurbiprofen Penicillamine   Butalbital Compound Four-way cold tablets Penicillin   Butazolidin Fragmin Pepto-Bismol   Carbenicillin Geminisyn Percodan   Carna Arthritis Reliever Geopen Persantine   Carprofen Gold's salt Persistin   Chloramphenicol Goody's Phenylbutazone   Chloromycetin Haltrain Piroxlcam   Clmetidine heparin Plaquenil   Cllnoril Hyco-pap Ponstel   Clofibrate Hydroxy chloroquine Propoxyphen         Before stopping any of these medications, be sure to consult the physician who ordered them.  Some, such as Coumadin (Warfarin) are ordered to prevent or treat serious conditions such as "deep thrombosis", "pumonary embolisms", and other heart problems.  The amount of time that you may need off of the medication may also vary with the medication and the reason for which you were taking it.  If you are taking any of these medications, please make sure you notify your pain physician before you undergo any  procedures.         Epidural Steroid Injection Patient Information  Description: The epidural space surrounds the nerves as they exit the spinal cord.  In some patients, the nerves can be compressed and inflamed by a bulging disc or a tight spinal canal (spinal stenosis).  By injecting steroids into the epidural space, we can bring irritated nerves into direct contact with a potentially helpful medication.  These steroids act directly on the irritated nerves and can reduce swelling and inflammation which often leads to decreased pain.  Epidural steroids may be injected anywhere along the spine and from the neck to the low back depending upon the location of your pain.   After numbing the skin with local anesthetic (like Novocaine), a small needle is passed   into the epidural space slowly.  You may experience a sensation of pressure while this is being done.  The entire block usually last less than 10 minutes.  Conditions which may be treated by epidural steroids:   Low back and leg pain  Neck and arm pain  Spinal stenosis  Post-laminectomy syndrome  Herpes zoster (shingles) pain  Pain from compression fractures  Preparation for the injection:  1. Do not eat any solid food or dairy products within 8 hours of your appointment.  2. You may drink clear liquids up to 3 hours before appointment.  Clear liquids include water, black coffee, juice or soda.  No milk or cream please. 3. You may take your regular medication, including pain medications, with a sip of water before your appointment  Diabetics should hold regular insulin (if taken separately) and take 1/2 normal NPH dos the morning of the procedure.  Carry some sugar containing items with you to your appointment. 4. A driver must accompany you and be prepared to drive you home after your procedure.  5. Bring all your current medications with your. 6. An IV may be inserted and sedation may be given at the discretion of the  physician.   7. A blood pressure cuff, EKG and other monitors will often be applied during the procedure.  Some patients may need to have extra oxygen administered for a short period. 8. You will be asked to provide medical information, including your allergies, prior to the procedure.  We must know immediately if you are taking blood thinners (like Coumadin/Warfarin)  Or if you are allergic to IV iodine contrast (dye). We must know if you could possible be pregnant.  Possible side-effects:  Bleeding from needle site  Infection (rare, may require surgery)  Nerve injury (rare)  Numbness & tingling (temporary)  Difficulty urinating (rare, temporary)  Spinal headache ( a headache worse with upright posture)  Light -headedness (temporary)  Pain at injection site (several days)  Decreased blood pressure (temporary)  Weakness in arm/leg (temporary)  Pressure sensation in back/neck (temporary)  Call if you experience:  Fever/chills associated with headache or increased back/neck pain.  Headache worsened by an upright position.  New onset weakness or numbness of an extremity below the injection site  Hives or difficulty breathing (go to the emergency room)  Inflammation or drainage at the infection site  Severe back/neck pain  Any new symptoms which are concerning to you  Please note:  Although the local anesthetic injected can often make your back or neck feel good for several hours after the injection, the pain will likely return.  It takes 3-7 days for steroids to work in the epidural space.  You may not notice any pain relief for at least that one week.  If effective, we will often do a series of three injections spaced 3-6 weeks apart to maximally decrease your pain.  After the initial series, we generally will wait several months before considering a repeat injection of the same type.  If you have any questions, please call [phone] [facility] 

## 2017-06-23 NOTE — Progress Notes (Signed)
Subjective:  Patient ID: Jasmine Webster, female    DOB: [birth year]  Age: 83 y.o. MRN: [id number]  CC: Back Pain (lower right)     PROCEDURE:None  HPI Jasmine Webster presents for reevaluation. She was last seen a few months ago and has had a series of caudal epidural steroid injections. As per our discussion today she feels that these have helped significantly with her lower extremity pain and her calf cramping. She has taken Vicodin in the past but has needed this minimally. Her last prescription was filled several months ago. She has some calf and lower extremity fatigue with prolonged standing and continues to have some aching gnawing pain in the low back. This is 25-30% better overall but her leg pain is dramatically improved. She still gets some right groin pain but her left side leg pain and groin pain has resolved. The pain is described as an aching gnawing intermittent pain. She denies any troubles with bowel or bladder function or lower extremity strength.   By history She is a pleasant 83 year old white female with a long-standing history of low back pain. She describes having had multiple procedures on her low back and neck and has undergone a low thoracic to lower lumbar spinal fusion. She was recently seen by Dr. Earle Gell and subsequently referred to [name] for her sciatica. She experiences this primarily in the left lower back with radiation into the left posterior leg and describes it as a maximum pain score of 6 that does not appear to be influenced by time of day but is worse with prolonged sitting. Alleviating factors include lying down and particular positions to decompress her back seems to help. She does not have any appreciable lower extremity weakness. She occasionally gets some cramping type pain especially in the left lower back with radiation into the buttocks and hip that is burning and constant. She cannot get any relief at this time and has been on medication management  and has gone through conservative therapy without significant improvement. She also has had a previous CT myelogram and I reviewed the results of this today.  History Jasmine Webster has a past medical history of Arthritis; Cataract; Chronic back pain; and Diabetes mellitus without complication ([place]).   She has a past surgical history that includes Back surgery; Appendectomy; Abdominal hysterectomy; Tonsillectomy and adenoidectomy; Cataract extraction; and Thoracolumbar syringo shunt.   Her family history includes Cancer in her brother and brother; Diabetes in her father; Heart disease in her mother and sister; Pneumonia in her father; Stroke in her brother, father, and sister; Sudden death in her brother, sister, and sister.She reports that she has never smoked. She has never used smokeless tobacco. She reports that she does not drink alcohol or use drugs.  No results found for this or any previous visit.  No results found for: TOXASSSELUR  Outpatient Medications Prior to Visit  Medication Sig Dispense Refill  . ALPRAZolam (XANAX) 0.5 MG tablet TAKE ONE TABLET (0.5 MG TOTAL) BY MOUTH AT BEDTIME AS NEEDED 30 tablet 5  . amLODipine (NORVASC) 5 MG tablet TAKE 1 TABLET BY MOUTH EVERY DAY 90 tablet 3  . aspirin 81 MG tablet Take 81 mg by mouth daily.    Marland Kitchen ezetimibe (ZETIA) 10 MG tablet Take 1 tablet (10 mg total) by mouth daily. 90 tablet 3  . gabapentin (NEURONTIN) 100 MG capsule 200 mg 2 (two) times daily.     Marland Kitchen glucose blood (ONE TOUCH ULTRA TEST) test strip Check sugar  twice daily. 100 each 3  . Insulin Glargine (LANTUS SOLOSTAR) 100 UNIT/ML Solostar Pen INJECT 28 UNITS SUBCUTANEOUSLY AT BEDTIME 27 pen 3  . Insulin Pen Needle 31G X 8 MM MISC Check sugar 2 times daily, DX E11.9 (needs ultra fine pen needles) 90 each 12  . Lancets (ONETOUCH ULTRASOFT) lancets Check sugar twice daily DX E11.9 100 each 12  . Multiple Vitamins-Minerals (PRESERVISION AREDS) CAPS Take 2 capsules by mouth daily.     .  Omega-3 Fatty Acids (FISH OIL) 1000 MG CAPS Take 1,000 mg by mouth daily.     . ondansetron (ZOFRAN) 4 MG tablet Take 1 tablet (4 mg total) by mouth every 8 (eight) hours as needed for nausea or vomiting. 8 tablet 0  . quinapril-hydrochlorothiazide (ACCURETIC) 20-12.5 MG tablet TAKE 1 TABLET BY MOUTH TWICE A DAY 180 tablet 3  . ranitidine (ZANTAC) 150 MG tablet Take 1 tablet (150 mg total) by mouth 2 (two) times daily. 60 tablet 12  . tobramycin-dexamethasone (TOBRADEX) ophthalmic solution tobramycin 0.3 %-dexamethasone 0.1 % eye drops,suspension     No facility-administered medications prior to visit.    Lab Results  Component Value Date   WBC 9.1 04/18/2017   HGB 13.7 04/18/2017   HCT 40.2 04/18/2017   PLT 162 04/18/2017   GLUCOSE 100 (H) 04/18/2017   CHOL 300 (H) 02/18/2017   TRIG 172 (H) 02/18/2017   HDL 62 02/18/2017   LDLCALC 204 (H) 02/18/2017   ALT 19 04/18/2017   AST 25 04/18/2017   NA 133 (L) 04/18/2017   K 4.0 04/18/2017   CL 99 (L) 04/18/2017   CREATININE 1.11 (H) 04/18/2017   BUN 27 (H) 04/18/2017   CO2 24 04/18/2017   TSH 2.220 06/10/2017   HGBA1C 6.9 06/02/2017   MICROALBUR 20 06/02/2017    --------------------------------------------------------------------------------------------------------------------- Ct Lumbar Spine W Contrast  Result Date: 12/18/2016 CLINICAL DATA:  Back pain EXAM: CT MYELOGRAPHY LUMBAR SPINE TECHNIQUE: CT imaging of the lumbar spine was performed after intrathecal contrast administration. Multiplanar CT image reconstructions were also generated. COMPARISON:  None. FINDINGS: There are bilateral pedicle screws in T12 through S1. There are cross stabilizing bars extending from the thoracic spine to the L4 pedicle screws. There are cross stabilizing bars extending from the L5-S1 pedicle screws. There are disc spacers present at every level except for L1-2 in above. There is solid fusion in the posterior elements from T11 through S1. The study  extends from the mid T11 level. There is also solid fusion across the L2-3, L3-4, and L5-S1 discs. Fusion across the L4-5 disc has not yet occurred. Segmentation:  5 lumbar type vertebral bodies. Alignment: Normal. Vertebrae: There is no vertebral compression deformity. Conus medullaris: Extends to the L1-2 disc level and appears normal. Paraspinal and other soft tissues: Aortic atherosclerotic calcification. Disc levels: L1-2: Central canal is widely patent. L2-3:  Central canal is widely patent.  Posterior decompression. L3-4:  Central canal is widely patent.  Posterior decompression. L4-5: Posterior decompression. Central canal is widely patent. Disc osteophytes to encroach upon the foramina bilaterally. This results in mild foraminal narrowing. L5-S1: Central canal is widely patent. Disc osteophytes mildly encroach upon the foramina bilaterally. No obvious narrowing. Posterior decompression. IMPRESSION: Thoracolumbar fusion is solid as described above. This includes solid fusion across the posterior elements at L4-5 without solid fusion across the disc space. I suspect that the apparent abnormal motion noted on plain radiographs was artifactual and fusion is solid and intact. There is mild foraminal narrowing at L4-5  secondary to disc osteophytes. Electronically Signed   By: Marybelle Killings M.D.   On: 12/18/2016 15:15   Dg Myelography Lumbar Inj Lumbosacral  Result Date: 12/18/2016 CLINICAL DATA:  Back pain.  Lumbar fusion. EXAM: LUMBAR MYELOGRAM FLUOROSCOPY TIME:  37 seconds.  15.6 mGy. PROCEDURE: After thorough discussion of risks and benefits of the procedure including bleeding, infection, injury to nerves, blood vessels, adjacent structures as well as headache and CSF leak, written and oral informed consent was obtained. Consent was obtained by Dr. Marybelle Killings. Time out form was completed. Patient was positioned prone on the fluoroscopy table. Local anesthesia was provided with 1% lidocaine without  epinephrine after prepped and draped in the usual sterile fashion. Puncture was performed at L3-4 using a 3 1/2 inch 22-gauge spinal needle via a midline approach. Using a single pass through the dura, the needle was placed within the thecal sac, with return of clear CSF. 15 mL of Omnipaque-180 was injected into the thecal sac, with normal opacification of the nerve roots and cauda equina consistent with free flow within the subarachnoid space. I personally performed the lumbar puncture and administered the intrathecal contrast. I also personally supervised acquisition of the myelogram images. TECHNIQUE: Contiguous axial images were obtained through the Lumbar spine after the intrathecal infusion of infusion. Coronal and sagittal reconstructions were obtained of the axial image sets. COMPARISON:  None FINDINGS: There are bilateral pedicle screw from L1 through S1 as well as in the lower thoracic spine. Cross stabilizing bars extend from the thoracic spine to the L4 pedicle screws. There are also L5-S1 pedicle screws with cross stabilizing bars. There is no breakage or loosening of the hardware. There is anatomic alignment of the vertebral bodies. There is no vertebral compression deformity. No obvious spinal stenosis is present. A disc spacer is present at L4-5. There is some motion in the disc between flexion and extension views. IMPRESSION: Extensive thoracolumbosacral fusion as described. There is noted to be some degree of motion at the L4-5 level. A disc spacer is present at this level but there is no posterior stabilization. Electronically Signed   By: Marybelle Killings M.D.   On: 12/18/2016 14:38       ---------------------------------------------------------------------------------------------------------------------- Past Medical History:  Diagnosis Date  . Arthritis   . Cataract    cataract removal bilaterally approx 20 years ago  . Chronic back pain   . Diabetes mellitus without complication [facility])      Past Surgical History:  Procedure Laterality Date  . ABDOMINAL HYSTERECTOMY    . APPENDECTOMY    . BACK SURGERY     three  . CATARACT EXTRACTION    . THORACOLUMBAR SYRINGO SHUNT    . TONSILLECTOMY AND ADENOIDECTOMY      Family History  Problem Relation Age of Onset  . Heart disease Mother        died from MI  . Heart disease Sister   . Sudden death Brother        shot and beaten to death during home invasion  . Stroke Sister   . Sudden death Sister        MVA  . Cancer Brother        died from lung cancer  . Sudden death Sister        35 days old  . Cancer Brother        died from lung cancer  . Stroke Brother        cause of death  . Diabetes  Father   . Stroke Father   . Pneumonia Father     Social History  Substance Use Topics  . Smoking status: Never Smoker  . Smokeless tobacco: Never Used  . Alcohol use No    ---------------------------------------------------------------------------------------------------------------------  BP (!) 171/85 (BP Location: Left Arm, Patient Position: Sitting, Cuff Size: Normal)   Pulse 96   Temp 97.8 F (36.6 C) (Oral)   Resp 16   Ht 5\' 4"  (1.626 m)   Wt 167 lb (75.8 kg)   SpO2 100%   BMI 28.67 kg/m    BP Readings from Last 3 Encounters:  06/23/17 (!) 171/85  04/25/17 (!) 130/44  04/18/17 (!) 150/61     Wt Readings from Last 3 Encounters:  06/23/17 167 lb (75.8 kg)  06/02/17 173 lb (78.5 kg)  04/25/17 170 lb (77.1 kg)     ----------------------------------------------------------------------------------------------------------------------  ROS Review of Systems  Cardiac: No angina or ectopy Pulmonary: No shortness of breath GI: No constipation Objective:  BP (!) 171/85 (BP Location: Left Arm, Patient Position: Sitting, Cuff Size: Normal)   Pulse 96   Temp 97.8 F (36.6 C) (Oral)   Resp 16   Ht 5\' 4"  (1.626 m)   Wt 167 lb (75.8 kg)   SpO2 100%   BMI 28.67 kg/m   Physical Exam Pupils are  equally round reactive to light extraocular muscles are intact and the patient is a good historian Heart reveals a 2/6 systolic ejection murmur Lungs are clear to auscultation Section low back reveals some paraspinous muscle tenderness but no overt trigger points with a well-healed midline scar. She is walking with a cane for assistance.   Assessment & Plan:   Jasmine Webster was seen today for back pain.  Diagnoses and all orders for this visit:  Sciatica of left side -     Lumbar Epidural Injection; Future  Low back pain at multiple sites  Spinal stenosis of lumbar region with neurogenic claudication -     Lumbar Epidural Injection; Future  Other orders -     HYDROcodone-acetaminophen (NORCO/VICODIN) 5-325 MG tablet; Take 1 tablet by mouth every 6 (six) hours as needed for moderate pain or severe pain.     ----------------------------------------------------------------------------------------------------------------------  Problem List Items Addressed This Visit    None    Visit Diagnoses    Sciatica of left side    -  Primary   Relevant Orders   Lumbar Epidural Injection   Low back pain at multiple sites       Relevant Medications   HYDROcodone-acetaminophen (NORCO/VICODIN) 5-325 MG tablet   Spinal stenosis of lumbar region with neurogenic claudication       Relevant Orders   Lumbar Epidural Injection      ----------------------------------------------------------------------------------------------------------------------  1. Failed back surgical syndrome At present her pain in the lower extremities is under reasonable control but she is having some new right groin pain. She is currently scheduled for a facet block however her symptom complex is more consistent with sciatica symptoms with spinal stenosis. These symptoms have responded favorably to caudal epidurals where she has failed to gain relief with conservative measures. As such I'm going to defer on injection today.  Also, she does not feel particularly well today though she is afebrile. We'll have her return to clinic in 1 month for a caudal epidural steroid. In the meantime I'm going to refill her Vicodin prescription for 5 mg tablets 1 twice a day #45 2. Facet arthritis of lumbosacral region [facility]) we have talked  about possible diagnostic and therapeutic lumbar facet blocks if she fails conservative and current therapy. 3. Sciatica of left side As above - Lumbar Epidural Injection; Future  4. Low back pain at multiple sites We have reviewed to [place] for tissue database information and it is appropriate. Furthermore she documents that she has gained relief and lifestyle improvement with her medication management of opioids. ----------------------------------------------------------------------------------------------------------------------  I am having Jasmine Webster start on HYDROcodone-acetaminophen. I am also having her maintain her gabapentin, PRESERVISION AREDS, aspirin, Fish Oil, ranitidine, amLODipine, quinapril-hydrochlorothiazide, Insulin Glargine, Insulin Pen Needle, glucose blood, ondansetron, onetouch ultrasoft, tobramycin-dexamethasone, ezetimibe, and ALPRAZolam.   Meds ordered this encounter  Medications  . HYDROcodone-acetaminophen (NORCO/VICODIN) 5-325 MG tablet    Sig: Take 1 tablet by mouth every 6 (six) hours as needed for moderate pain or severe pain.    Dispense:  45 tablet    Refill:  0     Follow-up: Return in about 1 month (around 07/24/2017) for procedure, evaluation.    Molli Barrows, MD 1:06 PM  The [place] practitioner database for opioid medications on this patient has been reviewed by me and my staff   Greater than 50% of the total encounter time was spent in counseling and / or coordination of care.     This dictation was performed utilizing Systems analyst.  Please excuse any unintentional or mistaken typographical errors as a result.

## 2017-06-27 DIAGNOSIS — R011 Cardiac murmur, unspecified: Secondary | ICD-10-CM | POA: Diagnosis not present

## 2017-07-04 ENCOUNTER — Ambulatory Visit (INDEPENDENT_AMBULATORY_CARE_PROVIDER_SITE_OTHER): Payer: PPO

## 2017-07-04 VITALS — HR 80 | Temp 98.6°F | Ht 64.0 in | Wt 173.4 lb

## 2017-07-04 DIAGNOSIS — Z0000 Encounter for general adult medical examination without abnormal findings: Secondary | ICD-10-CM

## 2017-07-04 NOTE — Patient Instructions (Signed)
Jasmine Webster , Thank you for taking time to come for your Medicare Wellness Visit. I appreciate your ongoing commitment to your health goals. Please review the following plan we discussed and let me know if I can assist you in the future.   Screening recommendations/referrals: Colonoscopy: up to date and no longer required Mammogram: up to date and no longer required Bone Density: completed per patient Recommended yearly ophthalmology/optometry visit for glaucoma screening and checkup Recommended yearly dental visit for hygiene and checkup  Vaccinations: Influenza vaccine: due fall 2018 Pneumococcal vaccine: completed series Tdap vaccine: up to date and due 03/29/10 Shingles vaccine: up to date per patient  Advanced directives: Please bring a copy of your POA (Power of [place]) and/or Living Will to your next appointment.   Conditions/risks identified: Recommend exercising 3x per week (20 min per time).  Next appointment: 09/17/17   Preventive Care 81 Years and Older, Female Preventive care refers to lifestyle choices and visits with your health care provider that can promote health and wellness. What does preventive care include?  A yearly physical exam. This is also called an annual well check.  Dental exams once or twice a year.  Routine eye exams. Ask your health care provider how often you should have your eyes checked.  Personal lifestyle choices, including:  Daily care of your teeth and gums.  Regular physical activity.  Eating a healthy diet.  Avoiding tobacco and drug use.  Limiting alcohol use.  Practicing safe sex.  Taking low-dose aspirin every day.  Taking vitamin and mineral supplements as recommended by your health care provider. What happens during an annual well check? The services and screenings done by your health care provider during your annual well check will depend on your age, overall health, lifestyle risk factors, and family history of  disease. Counseling  Your health care provider may ask you questions about your:  Alcohol use.  Tobacco use.  Drug use.  Emotional well-being.  Home and relationship well-being.  Sexual activity.  Eating habits.  History of falls.  Memory and ability to understand (cognition).  Work and work Statistician.  Reproductive health. Screening  You may have the following tests or measurements:  Height, weight, and BMI.  Blood pressure.  Lipid and cholesterol levels. These may be checked every 5 years, or more frequently if you are over 67 years old.  Skin check.  Lung cancer screening. You may have this screening every year starting at age 81 if you have a 30-pack-year history of smoking and currently smoke or have quit within the past 15 years.  Fecal occult blood test (FOBT) of the stool. You may have this test every year starting at age 81.  Flexible sigmoidoscopy or colonoscopy. You may have a sigmoidoscopy every 5 years or a colonoscopy every 10 years starting at age 74.  Hepatitis C blood test.  Hepatitis B blood test.  Sexually transmitted disease (STD) testing.  Diabetes screening. This is done by checking your blood sugar (glucose) after you have not eaten for a while (fasting). You may have this done every 1-3 years.  Bone density scan. This is done to screen for osteoporosis. You may have this done starting at age 81.  Mammogram. This may be done every 1-2 years. Talk to your health care provider about how often you should have regular mammograms. Talk with your health care provider about your test results, treatment options, and if necessary, the need for more tests. Vaccines  Your health care provider  may recommend certain vaccines, such as:  Influenza vaccine. This is recommended every year.  Tetanus, diphtheria, and acellular pertussis (Tdap, Td) vaccine. You may need a Td booster every 10 years.  Zoster vaccine. You may need this after age  81.  Pneumococcal 13-valent conjugate (PCV13) vaccine. One dose is recommended after age 81.  Pneumococcal polysaccharide (PPSV23) vaccine. One dose is recommended after age 81. Talk to your health care provider about which screenings and vaccines you need and how often you need them. This information is not intended to replace advice given to you by your health care provider. Make sure you discuss any questions you have with your health care provider. Document Released: 12/01/2015 Document Revised: 07/24/2016 Document Reviewed: 09/05/2015 Elsevier Interactive Patient Education  2017 [employer] Prevention in the Home Falls can cause injuries. They can happen to people of all ages. There are many things you can do to make your home safe and to help prevent falls. What can I do on the outside of my home?  Regularly fix the edges of walkways and driveways and fix any cracks.  Remove anything that might make you trip as you walk through a door, such as a raised step or threshold.  Trim any bushes or trees on the path to your home.  Use bright outdoor lighting.  Clear any walking paths of anything that might make someone trip, such as rocks or tools.  Regularly check to see if handrails are loose or broken. Make sure that both sides of any steps have handrails.  Any raised decks and porches should have guardrails on the edges.  Have any leaves, snow, or ice cleared regularly.  Use sand or salt on walking paths during winter.  Clean up any spills in your garage right away. This includes oil or grease spills. What can I do in the bathroom?  Use night lights.  Install grab bars by the toilet and in the tub and shower. Do not use towel bars as grab bars.  Use non-skid mats or decals in the tub or shower.  If you need to sit down in the shower, use a plastic, non-slip stool.  Keep the floor dry. Clean up any water that spills on the floor as soon as it happens.  Remove  soap buildup in the tub or shower regularly.  Attach bath mats securely with double-sided non-slip rug tape.  Do not have throw rugs and other things on the floor that can make you trip. What can I do in the bedroom?  Use night lights.  Make sure that you have a light by your bed that is easy to reach.  Do not use any sheets or blankets that are too big for your bed. They should not hang down onto the floor.  Have a firm chair that has side arms. You can use this for support while you get dressed.  Do not have throw rugs and other things on the floor that can make you trip. What can I do in the kitchen?  Clean up any spills right away.  Avoid walking on wet floors.  Keep items that you use a lot in easy-to-reach places.  If you need to reach something above you, use a strong step stool that has a grab bar.  Keep electrical cords out of the way.  Do not use floor polish or wax that makes floors slippery. If you must use wax, use non-skid floor wax.  Do not have throw rugs  and other things on the floor that can make you trip. What can I do with my stairs?  Do not leave any items on the stairs.  Make sure that there are handrails on both sides of the stairs and use them. Fix handrails that are broken or loose. Make sure that handrails are as long as the stairways.  Check any carpeting to make sure that it is firmly attached to the stairs. Fix any carpet that is loose or worn.  Avoid having throw rugs at the top or bottom of the stairs. If you do have throw rugs, attach them to the floor with carpet tape.  Make sure that you have a light switch at the top of the stairs and the bottom of the stairs. If you do not have them, ask someone to add them for you. What else can I do to help prevent falls?  Wear shoes that:  Do not have high heels.  Have rubber bottoms.  Are comfortable and fit you well.  Are closed at the toe. Do not wear sandals.  If you use a  stepladder:  Make sure that it is fully opened. Do not climb a closed stepladder.  Make sure that both sides of the stepladder are locked into place.  Ask someone to hold it for you, if possible.  Clearly mark and make sure that you can see:  Any grab bars or handrails.  First and last steps.  Where the edge of each step is.  Use tools that help you move around (mobility aids) if they are needed. These include:  Canes.  Walkers.  Scooters.  Crutches.  Turn on the lights when you go into a dark area. Replace any light bulbs as soon as they burn out.  Set up your furniture so you have a clear path. Avoid moving your furniture around.  If any of your floors are uneven, fix them.  If there are any pets around you, be aware of where they are.  Review your medicines with your doctor. Some medicines can make you feel dizzy. This can increase your chance of falling. Ask your doctor what other things that you can do to help prevent falls. This information is not intended to replace advice given to you by your health care provider. Make sure you discuss any questions you have with your health care provider. Document Released: 08/31/2009 Document Revised: 04/11/2016 Document Reviewed: 12/09/2014 Elsevier Interactive Patient Education  2017 [employer].

## 2017-07-04 NOTE — Progress Notes (Signed)
Subjective:   Jasmine Webster is a 83 y.o. female who presents for Medicare Annual (Subsequent) preventive examination.  Review of Systems:  N/A  Cardiac Risk Factors include: advanced age (>46men, >81 women);diabetes mellitus;hypertension;dyslipidemia     Objective:     Vitals: Pulse 80   Temp 98.6 F (37 C) (Oral)   Ht 5\' 4"  (1.626 m)   Wt 173 lb 6.4 oz (78.7 kg)   BMI 29.76 kg/m   Body mass index is 29.76 kg/m.   Tobacco History  Smoking Status  . Never Smoker  Smokeless Tobacco  . Never Used     Counseling given: Not Answered   Past Medical History:  Diagnosis Date  . Arthritis   . Cataract    cataract removal bilaterally approx 20 years ago  . Chronic back pain   . Diabetes mellitus without complication [facility])    Past Surgical History:  Procedure Laterality Date  . ABDOMINAL HYSTERECTOMY    . APPENDECTOMY    . BACK SURGERY     three  . CATARACT EXTRACTION    . THORACOLUMBAR SYRINGO SHUNT    . TONSILLECTOMY AND ADENOIDECTOMY     Family History  Problem Relation Age of Onset  . Heart disease Mother        died from MI  . Heart disease Sister   . Sudden death Brother        shot and beaten to death during home invasion  . Stroke Sister   . Sudden death Sister        MVA  . Cancer Brother        died from lung cancer  . Sudden death Sister        64 days old  . Cancer Brother        died from lung cancer  . Stroke Brother        cause of death  . Diabetes Father   . Stroke Father   . Pneumonia Father    History  Sexual Activity  . Sexual activity: No    Outpatient Encounter Prescriptions as of 07/04/2017  Medication Sig  . ALPRAZolam (XANAX) 0.5 MG tablet TAKE ONE TABLET (0.5 MG TOTAL) BY MOUTH AT BEDTIME AS NEEDED  . amLODipine (NORVASC) 5 MG tablet TAKE 1 TABLET BY MOUTH EVERY DAY  . aspirin 81 MG tablet Take 81 mg by mouth daily.  . carboxymethylcellulose (REFRESH PLUS) 0.5 % SOLN 1 drop daily as needed.  Marland Kitchen glucose blood (ONE  TOUCH ULTRA TEST) test strip Check sugar twice daily.  Marland Kitchen HYDROcodone-acetaminophen (NORCO/VICODIN) 5-325 MG tablet Take 1 tablet by mouth every 6 (six) hours as needed for moderate pain or severe pain.  . Insulin Glargine (LANTUS SOLOSTAR) 100 UNIT/ML Solostar Pen INJECT 28 UNITS SUBCUTANEOUSLY AT BEDTIME  . Insulin Pen Needle 31G X 8 MM MISC Check sugar 2 times daily, DX E11.9 (needs ultra fine pen needles)  . Lancets (ONETOUCH ULTRASOFT) lancets Check sugar twice daily DX E11.9  . Multiple Vitamins-Minerals (PRESERVISION AREDS) CAPS Take 2 capsules by mouth daily.   . Omega-3 Fatty Acids (FISH OIL) 1000 MG CAPS Take 1,000 mg by mouth daily.   . ondansetron (ZOFRAN) 4 MG tablet Take 1 tablet (4 mg total) by mouth every 8 (eight) hours as needed for nausea or vomiting.  . quinapril-hydrochlorothiazide (ACCURETIC) 20-12.5 MG tablet TAKE 1 TABLET BY MOUTH TWICE A DAY  . ranitidine (ZANTAC) 150 MG tablet Take 1 tablet (150 mg total) by mouth 2 (  two) times daily.  Marland Kitchen ezetimibe (ZETIA) 10 MG tablet Take 1 tablet (10 mg total) by mouth daily. (Patient not taking: Reported on 07/04/2017)  . gabapentin (NEURONTIN) 100 MG capsule 200 mg 2 (two) times daily.   . [DISCONTINUED] tobramycin-dexamethasone (TOBRADEX) ophthalmic solution tobramycin 0.3 %-dexamethasone 0.1 % eye drops,suspension   No facility-administered encounter medications on file as of 07/04/2017.     Activities of Daily Living In your present state of health, do you have any difficulty performing the following activities: 07/04/2017 09/06/2016  Hearing? N N  Vision? Y N  Difficulty concentrating or making decisions? N Y  Walking or climbing stairs? N N  Comment - has lift chair  Dressing or bathing? N N  Doing errands, shopping? N N  Preparing Food and eating ? N N  Using the Toilet? N N  In the past six months, have you accidently leaked urine? N N  Do you have problems with loss of bowel control? N N  Managing your Medications? N N    Managing your Finances? N N  Housekeeping or managing your Housekeeping? N N  Some recent data might be hidden    Patient Care Team: Jerrol Banana., MD as PCP - General (Family Medicine) Andree Elk Alvina Filbert, MD as Consulting Physician (Pain Medicine) Ubaldo Glassing Javier Docker, MD as Consulting Physician (Cardiology)    Assessment:     Exercise Activities and Dietary recommendations Current Exercise Habits: The patient does not participate in regular exercise at present, Exercise limited by: orthopedic condition(s)  Goals    . Exercise           Starting 09/06/16, I will increase my 10 minute exercises to twice a day.    . Exercise 3x per week (20 min per time)          Recommend exercising 3x per week (20 min per time).      Fall Risk Fall Risk  07/04/2017 04/25/2017 03/25/2017 01/17/2017 01/06/2017  Falls in the past year? No No No No No   Depression Screen PHQ 2/9 Scores 07/04/2017 04/25/2017 03/25/2017 02/04/2017  PHQ - 2 Score 0 0 0 0  Exception Documentation - Patient refusal Patient refusal -     Cognitive Function     6CIT Screen 09/06/2016  What Year? 0 points  What month? 0 points  What time? 0 points  Count back from 20 0 points  Months in reverse 4 points  Repeat phrase 6 points  Total Score 10    Immunization History  Administered Date(s) Administered  . Influenza, High Dose Seasonal PF 09/04/2015, 08/29/2016  . Influenza-Unspecified 09/18/2013  . Pneumococcal Conjugate-13 12/27/2014  . Pneumococcal Polysaccharide-23 07/13/1998  . Td 03/29/2010   Screening Tests Health Maintenance  Topic Date Due  . FOOT EXAM  05/08/2017  . INFLUENZA VACCINE  06/18/2017  . OPHTHALMOLOGY EXAM  10/18/2017 (Originally 04/03/2017)  . DEXA SCAN  09/06/2026 (Originally 06/12/1993)  . HEMOGLOBIN A1C  12/03/2017  . TETANUS/TDAP  03/29/2020  . PNA vac Low Risk Adult  Completed      Plan:  I have personally reviewed and addressed the Medicare Annual Wellness questionnaire and have  noted the following in the patient's chart:  A. Medical and social history B. Use of alcohol, tobacco or illicit drugs  C. Current medications and supplements D. Functional ability and status E.  Nutritional status F.  Physical activity G. Advance directives H. List of other physicians I.  Hospitalizations, surgeries, and ER visits in previous  12 months J.  [place] such as hearing and vision if needed, cognitive and depression L. Referrals and appointments - none  In addition, I have reviewed and discussed with patient certain preventive protocols, quality metrics, and best practice recommendations. A written personalized care plan for preventive services as well as general preventive health recommendations were provided to patient.  See attached scanned questionnaire for additional information.   Signed,  Fabio Neighbors, LPN Nurse Health Advisor   MD Recommendations: Pt needs a diabetic foot exam and next OV on 09/17/17. Pt to set up eye exam by the end of 2018.

## 2017-07-10 DIAGNOSIS — E7800 Pure hypercholesterolemia, unspecified: Secondary | ICD-10-CM | POA: Diagnosis not present

## 2017-07-10 DIAGNOSIS — R600 Localized edema: Secondary | ICD-10-CM | POA: Diagnosis not present

## 2017-07-10 DIAGNOSIS — I10 Essential (primary) hypertension: Secondary | ICD-10-CM | POA: Diagnosis not present

## 2017-08-05 DIAGNOSIS — E1142 Type 2 diabetes mellitus with diabetic polyneuropathy: Secondary | ICD-10-CM | POA: Diagnosis not present

## 2017-08-05 DIAGNOSIS — M898X9 Other specified disorders of bone, unspecified site: Secondary | ICD-10-CM | POA: Diagnosis not present

## 2017-08-05 DIAGNOSIS — M79675 Pain in left toe(s): Secondary | ICD-10-CM | POA: Diagnosis not present

## 2017-08-05 DIAGNOSIS — L851 Acquired keratosis [keratoderma] palmaris et plantaris: Secondary | ICD-10-CM | POA: Diagnosis not present

## 2017-08-27 ENCOUNTER — Ambulatory Visit: Payer: PPO | Attending: Anesthesiology | Admitting: Anesthesiology

## 2017-08-27 ENCOUNTER — Encounter: Payer: Self-pay | Admitting: Anesthesiology

## 2017-08-27 ENCOUNTER — Other Ambulatory Visit: Payer: Self-pay | Admitting: Anesthesiology

## 2017-08-27 VITALS — BP 147/55 | HR 79 | Temp 98.2°F | Resp 16 | Ht 64.0 in | Wt 170.0 lb

## 2017-08-27 DIAGNOSIS — M48062 Spinal stenosis, lumbar region with neurogenic claudication: Secondary | ICD-10-CM | POA: Insufficient documentation

## 2017-08-27 DIAGNOSIS — M4697 Unspecified inflammatory spondylopathy, lumbosacral region: Secondary | ICD-10-CM

## 2017-08-27 DIAGNOSIS — M5442 Lumbago with sciatica, left side: Secondary | ICD-10-CM | POA: Insufficient documentation

## 2017-08-27 DIAGNOSIS — M545 Low back pain, unspecified: Secondary | ICD-10-CM

## 2017-08-27 DIAGNOSIS — R52 Pain, unspecified: Secondary | ICD-10-CM

## 2017-08-27 DIAGNOSIS — M961 Postlaminectomy syndrome, not elsewhere classified: Secondary | ICD-10-CM | POA: Diagnosis not present

## 2017-08-27 DIAGNOSIS — Z7982 Long term (current) use of aspirin: Secondary | ICD-10-CM | POA: Insufficient documentation

## 2017-08-27 DIAGNOSIS — M5432 Sciatica, left side: Secondary | ICD-10-CM

## 2017-08-27 DIAGNOSIS — M47817 Spondylosis without myelopathy or radiculopathy, lumbosacral region: Secondary | ICD-10-CM

## 2017-08-28 NOTE — Progress Notes (Signed)
Subjective:  Patient ID: Jasmine Webster, female    DOB: [birth year]  Age: 83 y.o. MRN: [id number]  CC: Back Pain (low)   Procedure: None   HPI Jasmine Webster presents for reevaluation last seen several weeks ago. She is now status post a series of caudal epidural steroids for her low back pain. This seems to be doing reasonably well at this point. She was scheduled for an injection today however her lower extremity pain has been overall improved. She is having less cramping pain in her calves and less of the numbness and tingling. Her low back pain has been manageable. She uses some occasional hydrocodone for its management however most of the time the anti-inflammatory medications work for her. No new change in lower extremity strength or function or bowel bladder function are noted this time. She is trying do her physical therapy exercises as well. Based on her narcotic assessment sheet she continues to derive good lifestyle functional improvement with the medications.  Outpatient Medications Prior to Visit  Medication Sig Dispense Refill  . ALPRAZolam (XANAX) 0.5 MG tablet TAKE ONE TABLET (0.5 MG TOTAL) BY MOUTH AT BEDTIME AS NEEDED 30 tablet 5  . amLODipine (NORVASC) 5 MG tablet TAKE 1 TABLET BY MOUTH EVERY DAY 90 tablet 3  . aspirin 81 MG tablet Take 81 mg by mouth daily.    . carboxymethylcellulose (REFRESH PLUS) 0.5 % SOLN 1 drop daily as needed.    . ezetimibe (ZETIA) 10 MG tablet Take 1 tablet (10 mg total) by mouth daily. 90 tablet 3  . gabapentin (NEURONTIN) 100 MG capsule 200 mg 2 (two) times daily.     Marland Kitchen glucose blood (ONE TOUCH ULTRA TEST) test strip Check sugar twice daily. 100 each 3  . HYDROcodone-acetaminophen (NORCO/VICODIN) 5-325 MG tablet Take 1 tablet by mouth every 6 (six) hours as needed for moderate pain or severe pain. 45 tablet 0  . Insulin Glargine (LANTUS SOLOSTAR) 100 UNIT/ML Solostar Pen INJECT 28 UNITS SUBCUTANEOUSLY AT BEDTIME 27 pen 3  . Insulin Pen Needle  31G X 8 MM MISC Check sugar 2 times daily, DX E11.9 (needs ultra fine pen needles) 90 each 12  . Lancets (ONETOUCH ULTRASOFT) lancets Check sugar twice daily DX E11.9 100 each 12  . Multiple Vitamins-Minerals (PRESERVISION AREDS) CAPS Take 2 capsules by mouth daily.     . Omega-3 Fatty Acids (FISH OIL) 1000 MG CAPS Take 1,000 mg by mouth daily.     . ondansetron (ZOFRAN) 4 MG tablet Take 1 tablet (4 mg total) by mouth every 8 (eight) hours as needed for nausea or vomiting. 8 tablet 0  . quinapril-hydrochlorothiazide (ACCURETIC) 20-12.5 MG tablet TAKE 1 TABLET BY MOUTH TWICE A DAY 180 tablet 3  . ranitidine (ZANTAC) 150 MG tablet Take 1 tablet (150 mg total) by mouth 2 (two) times daily. 60 tablet 12   No facility-administered medications prior to visit.     Review of Systems CNS: No sedation or confusion Cardiac: No angina or palpitations GI: No constipation or abdominal pain  Objective:  BP (!) 147/55   Pulse 79   Temp 98.2 F (36.8 C)   Resp 16   Ht 5\' 4"  (1.626 m)   Wt 170 lb (77.1 kg)   SpO2 100%   BMI 29.18 kg/m    BP Readings from Last 3 Encounters:  08/27/17 (!) 147/55  06/23/17 (!) 171/85  04/25/17 (!) 130/44     Wt Readings from Last 3 Encounters:  08/27/17  170 lb (77.1 kg)  07/04/17 173 lb 6.4 oz (78.7 kg)  06/23/17 167 lb (75.8 kg)     Physical Exam Pt is alert and oriented PERRL EOMI HEART IS RRR no murmur or rub LCTA no wheezing or rhales MUSCULOSKELETALReveals some paraspinous muscle tenderness but no overt trigger points. She has some mild pain on extension. Her lower extremity muscle tone and bulk is good.  Labs  Lab Results  Component Value Date   HGBA1C 6.9 06/02/2017   HGBA1C 7.5 01/27/2017   HGBA1C 7.8 (H) 09/13/2016   Lab Results  Component Value Date   MICROALBUR 20 06/02/2017   LDLCALC 204 (H) 02/18/2017   CREATININE 1.11 (H) 04/18/2017     -------------------------------------------------------------------------------------------------------------------- Lab Results  Component Value Date   WBC 9.1 04/18/2017   HGB 13.7 04/18/2017   HCT 40.2 04/18/2017   PLT 162 04/18/2017   GLUCOSE 100 (H) 04/18/2017   CHOL 300 (H) 02/18/2017   TRIG 172 (H) 02/18/2017   HDL 62 02/18/2017   LDLCALC 204 (H) 02/18/2017   ALT 19 04/18/2017   AST 25 04/18/2017   NA 133 (L) 04/18/2017   K 4.0 04/18/2017   CL 99 (L) 04/18/2017   CREATININE 1.11 (H) 04/18/2017   BUN 27 (H) 04/18/2017   CO2 24 04/18/2017   TSH 2.220 06/10/2017   HGBA1C 6.9 06/02/2017   MICROALBUR 20 06/02/2017    --------------------------------------------------------------------------------------------------------------------- Dg C-arm 1-60 Min-no Report  Result Date: 04/25/2017 Fluoroscopy was utilized by the requesting physician.  No radiographic interpretation.     Assessment & Plan:   Gwynevere was seen today for back pain.  Diagnoses and all orders for this visit:  Low back pain at multiple sites  Sciatica of left side -     Lumbar Epidural Injection -     Lumbar Epidural Injection; Future  Spinal stenosis of lumbar region with neurogenic claudication -     Lumbar Epidural Injection  Facet arthritis of lumbosacral region ([place])  Failed back surgical syndrome        ----------------------------------------------------------------------------------------------------------------------  Problem List Items Addressed This Visit    None    Visit Diagnoses    Low back pain at multiple sites    -  Primary   Sciatica of left side       Relevant Orders   Lumbar Epidural Injection   Spinal stenosis of lumbar region with neurogenic claudication       Facet arthritis of lumbosacral region [facility])       Failed back surgical syndrome             ----------------------------------------------------------------------------------------------------------------------  1. Sciatica of left side Will defer on repeat injection today. I'm point schedule her for a return with repeat injection in 2 months. If she continues to do well we may push that out as well. Continue with her current exercise regimen as reviewed today. - Lumbar Epidural Injection - Lumbar Epidural Injection; Future  2. Spinal stenosis of lumbar region with neurogenic claudication As above - Lumbar Epidural Injection  3. Low back pain at multiple sites As above. Continue with back stretching strengthening  4. Facet arthritis of lumbosacral region [facility]) She may be a candidate for diagnostic facet block if needed. We've gone over the risks and benefits of this as well.  5. Failed back surgical syndrome Will refill her medications when indicated but right now she has sufficient quantities of Vicodin. We have reviewed to [facility] practitioner database information as well and it is appropriate.  She is to return to clinic approximate 2 months   ----------------------------------------------------------------------------------------------------------------------  I am having Ms. Ridings maintain her gabapentin, PRESERVISION AREDS, aspirin, Fish Oil, ranitidine, amLODipine, quinapril-hydrochlorothiazide, Insulin Glargine, Insulin Pen Needle, glucose blood, ondansetron, onetouch ultrasoft, ezetimibe, ALPRAZolam, HYDROcodone-acetaminophen, carboxymethylcellulose, omeprazole, and FLUZONE HIGH-DOSE.   Meds ordered this encounter  Medications  . omeprazole (PRILOSEC) 20 MG capsule    Sig: TAKE 1 CAPSULE BY MOUTH EVERY DAY  . FLUZONE HIGH-DOSE 0.5 ML injection    Sig: TO BE ADMINISTERED BY PHARMACIST FOR IMMUNIZATION    Refill:  0   Patient's Medications  New Prescriptions   No medications on file  Previous Medications   ALPRAZOLAM (XANAX) 0.5 MG TABLET    TAKE  ONE TABLET (0.5 MG TOTAL) BY MOUTH AT BEDTIME AS NEEDED   AMLODIPINE (NORVASC) 5 MG TABLET    TAKE 1 TABLET BY MOUTH EVERY DAY   ASPIRIN 81 MG TABLET    Take 81 mg by mouth daily.   CARBOXYMETHYLCELLULOSE (REFRESH PLUS) 0.5 % SOLN    1 drop daily as needed.   EZETIMIBE (ZETIA) 10 MG TABLET    Take 1 tablet (10 mg total) by mouth daily.   FLUZONE HIGH-DOSE 0.5 ML INJECTION    TO BE ADMINISTERED BY PHARMACIST FOR IMMUNIZATION   GABAPENTIN (NEURONTIN) 100 MG CAPSULE    200 mg 2 (two) times daily.    GLUCOSE BLOOD (ONE TOUCH ULTRA TEST) TEST STRIP    Check sugar twice daily.   HYDROCODONE-ACETAMINOPHEN (NORCO/VICODIN) 5-325 MG TABLET    Take 1 tablet by mouth every 6 (six) hours as needed for moderate pain or severe pain.   INSULIN GLARGINE (LANTUS SOLOSTAR) 100 UNIT/ML SOLOSTAR PEN    INJECT 28 UNITS SUBCUTANEOUSLY AT BEDTIME   INSULIN PEN NEEDLE 31G X 8 MM MISC    Check sugar 2 times daily, DX E11.9 (needs ultra fine pen needles)   LANCETS (ONETOUCH ULTRASOFT) LANCETS    Check sugar twice daily DX E11.9   MULTIPLE VITAMINS-MINERALS (PRESERVISION AREDS) CAPS    Take 2 capsules by mouth daily.    OMEGA-3 FATTY ACIDS (FISH OIL) 1000 MG CAPS    Take 1,000 mg by mouth daily.    OMEPRAZOLE (PRILOSEC) 20 MG CAPSULE    TAKE 1 CAPSULE BY MOUTH EVERY DAY   ONDANSETRON (ZOFRAN) 4 MG TABLET    Take 1 tablet (4 mg total) by mouth every 8 (eight) hours as needed for nausea or vomiting.   QUINAPRIL-HYDROCHLOROTHIAZIDE (ACCURETIC) 20-12.5 MG TABLET    TAKE 1 TABLET BY MOUTH TWICE A DAY   RANITIDINE (ZANTAC) 150 MG TABLET    Take 1 tablet (150 mg total) by mouth 2 (two) times daily.  Modified Medications   No medications on file  Discontinued Medications   No medications on file   ----------------------------------------------------------------------------------------------------------------------  Follow-up: Return in about 2 months (around 10/27/2017) for evaluation, med refill, procedure.    Molli Barrows, MD

## 2017-09-05 DIAGNOSIS — H3554 Dystrophies primarily involving the retinal pigment epithelium: Secondary | ICD-10-CM | POA: Diagnosis not present

## 2017-09-05 DIAGNOSIS — H43813 Vitreous degeneration, bilateral: Secondary | ICD-10-CM | POA: Diagnosis not present

## 2017-09-05 LAB — HM DIABETES EYE EXAM

## 2017-09-17 ENCOUNTER — Encounter: Payer: Self-pay | Admitting: Family Medicine

## 2017-09-17 ENCOUNTER — Ambulatory Visit (INDEPENDENT_AMBULATORY_CARE_PROVIDER_SITE_OTHER): Payer: PPO | Admitting: Family Medicine

## 2017-09-17 VITALS — BP 118/58 | HR 78 | Temp 97.6°F | Resp 14 | Wt 172.0 lb

## 2017-09-17 DIAGNOSIS — E119 Type 2 diabetes mellitus without complications: Secondary | ICD-10-CM | POA: Diagnosis not present

## 2017-09-17 DIAGNOSIS — I10 Essential (primary) hypertension: Secondary | ICD-10-CM

## 2017-09-17 DIAGNOSIS — K219 Gastro-esophageal reflux disease without esophagitis: Secondary | ICD-10-CM | POA: Diagnosis not present

## 2017-09-17 DIAGNOSIS — Z794 Long term (current) use of insulin: Secondary | ICD-10-CM | POA: Diagnosis not present

## 2017-09-17 DIAGNOSIS — E7800 Pure hypercholesterolemia, unspecified: Secondary | ICD-10-CM | POA: Diagnosis not present

## 2017-09-17 LAB — POCT GLYCOSYLATED HEMOGLOBIN (HGB A1C): HEMOGLOBIN A1C: 7.8

## 2017-09-17 MED ORDER — OMEPRAZOLE 20 MG PO CPDR: DELAYED_RELEASE_CAPSULE | ORAL | 3 refills | Status: DC

## 2017-09-17 NOTE — Progress Notes (Signed)
Patient: Jasmine Webster Female    DOB: 05-Aug-1926   83 y.o.   MRN: [id number] Visit Date: 09/17/2017  Today's Provider: [name], MD   Chief Complaint  Patient presents with  . Diabetes  . Hypertension  . Hyperlipidemia   Subjective:    HPI  Diabetes Mellitus Type II, Follow-up:   Lab Results  Component Value Date   HGBA1C 7.8 09/17/2017   HGBA1C 6.9 06/02/2017   HGBA1C 7.5 01/27/2017    Last seen for diabetes 3 months ago.  Management since then includes none. She reports good compliance with treatment. She is not having side effects.  Home blood sugar records: 200's the last 3 days  Episodes of hypoglycemia? no   Current Insulin Regimen: Lantus 28-30 Most Recent Eye Exam: 09/05/2017 Weight trend: stable Current exercise: none  Pertinent Labs:    Component Value Date/Time   CHOL 300 (H) 02/18/2017 0840   TRIG 172 (H) 02/18/2017 0840   HDL 62 02/18/2017 0840   LDLCALC 204 (H) 02/18/2017 0840   CREATININE 1.11 (H) 04/18/2017 1122    Wt Readings from Last 3 Encounters:  09/17/17 172 lb (78 kg)  08/27/17 170 lb (77.1 kg)  07/04/17 173 lb 6.4 oz (78.7 kg)    ------------------------------------------------------------------------  Hypertension, follow-up:  BP Readings from Last 3 Encounters:  09/17/17 (!) 118/58  08/27/17 (!) 147/55  06/23/17 (!) 171/85    She was last seen for hypertension 3 months ago.  BP at that visit was 147/55. Management since that visit includes none. She reports good compliance with treatment. She is not having side effects.  She is not exercising. She is adherent to low salt diet.   Outside blood pressures are not being checked. Patient denies chest pain, chest pressure/discomfort, claudication, dyspnea, exertional chest pressure/discomfort, fatigue, irregular heart beat, lower extremity edema, near-syncope, orthopnea, palpitations, paroxysmal nocturnal dyspnea, syncope and tachypnea.   Cardiovascular  risk factors include advanced age (older than 20 for men, 75 for women), diabetes mellitus, dyslipidemia and hypertension.   Wt Readings from Last 3 Encounters:  09/17/17 172 lb (78 kg)  08/27/17 170 lb (77.1 kg)  07/04/17 173 lb 6.4 oz (78.7 kg)    ------------------------------------------------------------------------   Lipid/Cholesterol, Follow-up:   Last seen for this 3 months ago.  Management changes since that visit include start Zetia. However pt never got the rx, so she has not been taking it. . Last Lipid Panel:    Component Value Date/Time   CHOL 300 (H) 02/18/2017 0840   TRIG 172 (H) 02/18/2017 0840   HDL 62 02/18/2017 0840   CHOLHDL 5.0 (H) 09/13/2016 0923   LDLCALC 204 (H) 02/18/2017 0840    Wt Readings from Last 3 Encounters:  09/17/17 172 lb (78 kg)  08/27/17 170 lb (77.1 kg)  07/04/17 173 lb 6.4 oz (78.7 kg)    -------------------------------------------------------------------     Allergies  Allergen Reactions  . Lodine [Etodolac] Nausea Only and Other (See Comments)    Dizziness, too  . Naproxen Other (See Comments)    Gave her an ulcer after taking it for years  . Statins Other (See Comments)    No energy and myalgias  . Lamisil [Terbinafine Hcl] Other (See Comments)    "Maybe made me nervous"  . Latex Rash     Current Outpatient Prescriptions:  .  ALPRAZolam (XANAX) 0.5 MG tablet, TAKE ONE TABLET (0.5 MG TOTAL) BY MOUTH AT BEDTIME AS NEEDED, Disp: 30 tablet, Rfl:  5 .  amLODipine (NORVASC) 5 MG tablet, TAKE 1 TABLET BY MOUTH EVERY DAY, Disp: 90 tablet, Rfl: 3 .  aspirin 81 MG tablet, Take 81 mg by mouth daily., Disp: , Rfl:  .  carboxymethylcellulose (REFRESH PLUS) 0.5 % SOLN, 1 drop daily as needed., Disp: , Rfl:  .  gabapentin (NEURONTIN) 100 MG capsule, 200 mg 2 (two) times daily. , Disp: , Rfl:  .  glucose blood (ONE TOUCH ULTRA TEST) test strip, Check sugar twice daily., Disp: 100 each, Rfl: 3 .  Insulin Glargine (LANTUS SOLOSTAR) 100  UNIT/ML Solostar Pen, INJECT 28 UNITS SUBCUTANEOUSLY AT BEDTIME, Disp: 27 pen, Rfl: 3 .  Insulin Pen Needle 31G X 8 MM MISC, Check sugar 2 times daily, DX E11.9 (needs ultra fine pen needles), Disp: 90 each, Rfl: 12 .  Lancets (ONETOUCH ULTRASOFT) lancets, Check sugar twice daily DX E11.9, Disp: 100 each, Rfl: 12 .  Multiple Vitamins-Minerals (PRESERVISION AREDS) CAPS, Take 2 capsules by mouth daily. , Disp: , Rfl:  .  Omega-3 Fatty Acids (FISH OIL) 1000 MG CAPS, Take 1,000 mg by mouth daily. , Disp: , Rfl:  .  omeprazole (PRILOSEC) 20 MG capsule, TAKE 1 CAPSULE BY MOUTH EVERY DAY, Disp: 90 capsule, Rfl: 3 .  quinapril-hydrochlorothiazide (ACCURETIC) 20-12.5 MG tablet, TAKE 1 TABLET BY MOUTH TWICE A DAY, Disp: 180 tablet, Rfl: 3 .  ranitidine (ZANTAC) 150 MG tablet, Take 1 tablet (150 mg total) by mouth 2 (two) times daily., Disp: 60 tablet, Rfl: 12 .  ezetimibe (ZETIA) 10 MG tablet, Take 1 tablet (10 mg total) by mouth daily. (Patient not taking: Reported on 09/17/2017), Disp: 90 tablet, Rfl: 3 .  HYDROcodone-acetaminophen (NORCO/VICODIN) 5-325 MG tablet, Take 1 tablet by mouth every 6 (six) hours as needed for moderate pain or severe pain. (Patient not taking: Reported on 09/17/2017), Disp: 45 tablet, Rfl: 0 .  ondansetron (ZOFRAN) 4 MG tablet, Take 1 tablet (4 mg total) by mouth every 8 (eight) hours as needed for nausea or vomiting. (Patient not taking: Reported on 09/17/2017), Disp: 8 tablet, Rfl: 0  Review of Systems  Constitutional: Negative.   HENT: Negative.   Eyes: Negative.   Respiratory: Negative.   Cardiovascular: Negative.   Gastrointestinal: Negative.   Endocrine: Negative.   Genitourinary: Negative.   Musculoskeletal: Negative.   Skin: Negative.   Allergic/Immunologic: Negative.   Neurological: Negative.   Hematological: Negative.   Psychiatric/Behavioral: Negative.     Social History  Substance Use Topics  . Smoking status: Never Smoker  . Smokeless tobacco: Never  Used  . Alcohol use No   Objective:   BP (!) 118/58 (BP Location: Left Arm, Patient Position: Sitting, Cuff Size: Normal)   Pulse 78   Temp 97.6 F (36.4 C) (Oral)   Resp 14   Wt 172 lb (78 kg)   BMI 29.52 kg/m  Vitals:   09/17/17 1119  BP: (!) 118/58  Pulse: 78  Resp: 14  Temp: 97.6 F (36.4 C)  TempSrc: Oral  Weight: 172 lb (78 kg)     Physical Exam  Constitutional: She is oriented to person, place, and time. She appears well-developed and well-nourished.  HENT:  Head: Normocephalic and atraumatic.  Right Ear: External ear normal.  Left Ear: External ear normal.  Nose: Nose normal.  Eyes: Pupils are equal, round, and reactive to light. Conjunctivae and EOM are normal.  Neck: Normal range of motion. Neck supple. No thyromegaly present.  Cardiovascular: Normal rate, regular rhythm, normal heart sounds and  intact distal pulses.   Pulmonary/Chest: Effort normal and breath sounds normal.  Abdominal: Soft.  Musculoskeletal: Normal range of motion.  Neurological: She is alert and oriented to person, place, and time. She has normal reflexes.  Skin: Skin is warm and dry.  Psychiatric: She has a normal mood and affect. Her behavior is normal. Judgment and thought content normal.        Assessment & Plan:     1. Type 2 diabetes mellitus without complication, with long-term current use of insulin (HCC) Goal closer to 7 without hypoglycemia. - POCT HgB A1C 7.8 today. Continue current medications.   2. Hypercholesteremia   3. Essential (primary) hypertension   4. Gastroesophageal reflux disease, esophagitis presence not specified  - omeprazole (PRILOSEC) 20 MG capsule; TAKE 1 CAPSULE BY MOUTH EVERY DAY  Dispense: 90 capsule; Refill: 3 5.DDD      HPI, Exam, and A&P Transcribed under the direction and in the presence of Richard L. Cranford Mon, MD  Electronically Signed: Katina Dung, CMA  I have done the exam and reviewed the above chart and it is accurate to the  best of my knowledge. Development worker, community has been used in this note in any air is in the dictation or transcription are unintentional.  [name], MD  [place]

## 2017-09-22 ENCOUNTER — Other Ambulatory Visit: Payer: Self-pay

## 2017-09-23 ENCOUNTER — Encounter: Payer: Self-pay | Admitting: Podiatry

## 2017-09-23 ENCOUNTER — Ambulatory Visit: Payer: PPO | Admitting: Podiatry

## 2017-09-23 VITALS — BP 130/61 | HR 71 | Temp 96.3°F

## 2017-09-23 DIAGNOSIS — L989 Disorder of the skin and subcutaneous tissue, unspecified: Secondary | ICD-10-CM

## 2017-09-23 NOTE — Progress Notes (Signed)
   Subjective:    Patient ID: Jasmine Webster, female    DOB: Apr 02, 1926, 83 y.o.   MRN: [id number]  HPI    Review of Systems  Constitutional: Positive for activity change, appetite change, chills, fatigue, fever and unexpected weight change.  HENT: Positive for sinus pressure and sinus pain.        Ear ringing  Eyes: Positive for visual disturbance.  Respiratory: Negative.   Cardiovascular: Negative.   Gastrointestinal: Negative.   Endocrine: Negative.   Genitourinary: Negative.   Musculoskeletal: Negative.   Skin: Positive for rash.       Change in nails  Allergic/Immunologic: Negative.   Neurological: Negative.   Hematological: Negative.   Psychiatric/Behavioral: Negative.        Objective:   Physical Exam        Assessment & Plan:

## 2017-09-25 NOTE — Progress Notes (Signed)
   Subjective: Patient with diabetes mellitus presents to the office today for chief complaint of painful callus lesions of the left fifth toe that has been present for the past year. Patient states that the pain is affecting her ability to ambulate without pain. Wearing closed toe shoes makes the pain worse. She states she has been seen by another podiatrist for this issue in the past. Patient presents today for further treatment and evaluation.   Past Medical History:  Diagnosis Date  . Arthritis   . Cataract    cataract removal bilaterally approx 20 years ago  . Chronic back pain   . Diabetes mellitus without complication ([place])     Objective:  Physical Exam General: Alert and oriented x3 in no acute distress  Dermatology: Hyperkeratotic lesion present on the left great toe. Pain on palpation with a central nucleated core noted.  Skin is warm, dry and supple bilateral lower extremities. Negative for open lesions or macerations.  Vascular: Palpable pedal pulses bilaterally. No edema or erythema noted. Capillary refill within normal limits.  Neurological: Epicritic and protective threshold diminished bilaterally.   Musculoskeletal Exam: Pain on palpation at the keratotic lesion noted. Range of motion within normal limits bilateral. Muscle strength 5/5 in all groups bilateral.  Assessment: #1 Diabetes mellitus w/ peripheral neuropathy #2 Pre-ulcerative callus lesion left fifth toe #3 Pain in feet   Plan of Care:  #1 Patient evaluated #2 Excisional debridement of keratotic lesion using a chisel blade was performed without incident.  #3 Dressed area with light dressing. #4 Patient is to return to the clinic PRN.    Edrick Kins, DPM [facility]  Dr. Edrick Kins, [address]                                        [place], [place] [address]                Office [phone]  Fax [phone]

## 2017-10-06 ENCOUNTER — Ambulatory Visit (HOSPITAL_BASED_OUTPATIENT_CLINIC_OR_DEPARTMENT_OTHER): Payer: PPO | Admitting: Anesthesiology

## 2017-10-06 ENCOUNTER — Encounter: Payer: Self-pay | Admitting: Anesthesiology

## 2017-10-06 ENCOUNTER — Other Ambulatory Visit: Payer: Self-pay | Admitting: Anesthesiology

## 2017-10-06 ENCOUNTER — Ambulatory Visit
Admission: RE | Admit: 2017-10-06 | Discharge: 2017-10-06 | Disposition: A | Discharge status: Home / Self Care | Payer: PPO | Source: Ambulatory Visit | Attending: Anesthesiology | Admitting: Anesthesiology

## 2017-10-06 VITALS — BP 154/69 | HR 85 | Temp 97.9°F | Resp 16 | Ht 64.0 in | Wt 170.0 lb

## 2017-10-06 DIAGNOSIS — Z79899 Other long term (current) drug therapy: Secondary | ICD-10-CM | POA: Diagnosis not present

## 2017-10-06 DIAGNOSIS — M5431 Sciatica, right side: Secondary | ICD-10-CM | POA: Diagnosis not present

## 2017-10-06 DIAGNOSIS — M5432 Sciatica, left side: Secondary | ICD-10-CM

## 2017-10-06 DIAGNOSIS — M4697 Unspecified inflammatory spondylopathy, lumbosacral region: Secondary | ICD-10-CM

## 2017-10-06 DIAGNOSIS — M48062 Spinal stenosis, lumbar region with neurogenic claudication: Secondary | ICD-10-CM

## 2017-10-06 DIAGNOSIS — Z794 Long term (current) use of insulin: Secondary | ICD-10-CM | POA: Diagnosis not present

## 2017-10-06 DIAGNOSIS — M545 Low back pain, unspecified: Secondary | ICD-10-CM

## 2017-10-06 DIAGNOSIS — R52 Pain, unspecified: Secondary | ICD-10-CM

## 2017-10-06 DIAGNOSIS — M47817 Spondylosis without myelopathy or radiculopathy, lumbosacral region: Secondary | ICD-10-CM

## 2017-10-06 MED ORDER — SODIUM CHLORIDE 0.9% FLUSH
10.0000 mL | Freq: Once | INTRAVENOUS | Status: DC
  Administered 2017-10-06: 10 mL

## 2017-10-06 MED ORDER — ROPIVACAINE HCL 2 MG/ML IJ SOLN
10.0000 mL | Freq: Once | INTRAMUSCULAR | Status: DC
  Administered 2017-10-06: 10 mL via EPIDURAL

## 2017-10-06 MED ORDER — LIDOCAINE HCL (PF) 1 % IJ SOLN
5.0000 mL | Freq: Once | INTRAMUSCULAR | Status: DC
  Administered 2017-10-06: 5 mL via SUBCUTANEOUS

## 2017-10-06 MED ORDER — TRIAMCINOLONE ACETONIDE 40 MG/ML IJ SUSP
INTRAMUSCULAR | Status: AC
  Filled 2017-10-06: qty 1

## 2017-10-06 MED ORDER — LIDOCAINE HCL (PF) 1 % IJ SOLN
INTRAMUSCULAR | Status: AC
  Filled 2017-10-06: qty 5

## 2017-10-06 MED ORDER — IOPAMIDOL (ISOVUE-M 200) INJECTION 41%
20.0000 mL | Freq: Once | INTRAMUSCULAR | Status: DC | PRN
  Administered 2017-10-06: 20 mL

## 2017-10-06 MED ORDER — ROPIVACAINE HCL 2 MG/ML IJ SOLN
INTRAMUSCULAR | Status: AC
  Filled 2017-10-06: qty 10

## 2017-10-06 MED ORDER — IOPAMIDOL (ISOVUE-M 200) INJECTION 41%
INTRAMUSCULAR | Status: AC
  Filled 2017-10-06: qty 10

## 2017-10-06 MED ORDER — SODIUM CHLORIDE 0.9 % IJ SOLN
INTRAMUSCULAR | Status: AC
  Filled 2017-10-06: qty 10

## 2017-10-06 MED ORDER — TRIAMCINOLONE ACETONIDE 40 MG/ML IJ SUSP
40.0000 mg | Freq: Once | INTRAMUSCULAR | Status: DC
  Administered 2017-10-06: 40 mg

## 2017-10-06 NOTE — Progress Notes (Signed)
Safety precautions to be maintained throughout the outpatient stay will include: orient to surroundings, keep bed in low position, maintain call bell within reach at all times, provide assistance with transfer out of bed and ambulation.  

## 2017-10-06 NOTE — Addendum Note (Signed)
Addended by: Molli Barrows on: 10/06/2017 02:49 PM   Modules accepted: Orders, Level of Service

## 2017-10-06 NOTE — Progress Notes (Signed)
Subjective:  Patient ID: Jasmine Webster, female    DOB: 13-Dec-1925  Age: 83 y.o. MRN: [id number]  CC: Back Pain (lower)   Procedure: Caudal epidural steroid under fluoroscopic guidance without sedation  HPI Jasmine Webster presents for reevaluation.  She was last seen a few months ago and has had some periodic caudal epidural steroids for her low back pain.  She has chronic centralized low back pain with radiation into the right groin and down the left leg periodically.  Her last epidural was back in June and she generally reports 2-3 months of good relief rated at 75% improvement overall.  She states that her low back pain has been reasonable as of lately however she has had a recent exacerbation over the past few weeks and some right groin pain with her traditional lower leg pain as well.  No change in bowel or bladder function or lower extremity strength and function are noted at this time.  It is mainly in gnawing aching type of low back pain that she experiences.  Outpatient Medications Prior to Visit  Medication Sig Dispense Refill  . ALPRAZolam (XANAX) 0.5 MG tablet TAKE ONE TABLET (0.5 MG TOTAL) BY MOUTH AT BEDTIME AS NEEDED 30 tablet 5  . amLODipine (NORVASC) 5 MG tablet TAKE 1 TABLET BY MOUTH EVERY DAY 90 tablet 3  . glucose blood ([employer] TEST STRIPS) test strip Check sugar twice daily.    Marland Kitchen glucose blood (ONE TOUCH ULTRA TEST) test strip Check sugar twice daily. 100 each 3  . Insulin Glargine (LANTUS SOLOSTAR) 100 UNIT/ML Solostar Pen INJECT 28 UNITS SUBCUTANEOUSLY AT BEDTIME 27 pen 3  . Insulin Pen Needle (FIFTY50 PEN NEEDLES) 31G X 8 MM MISC Check sugar 2 times daily, DX E11.9 (needs ultra fine pen needles)    . Lancets (ONETOUCH ULTRASOFT) lancets Check sugar twice daily DX E11.9 100 each 12  . omega-3 acid ethyl esters (LOVAZA) 1 g capsule Take by mouth.    Marland Kitchen omeprazole (PRILOSEC) 20 MG capsule TAKE 1 CAPSULE BY MOUTH EVERY DAY    . quinapril-hydrochlorothiazide (ACCURETIC)  20-12.5 MG tablet TAKE 1 TABLET BY MOUTH TWICE A DAY 180 tablet 3  . ranitidine (ZANTAC) 150 MG tablet Take 1 tablet (150 mg total) by mouth 2 (two) times daily. 60 tablet 12  . gabapentin (NEURONTIN) 100 MG capsule 200 mg 2 (two) times daily.      No facility-administered medications prior to visit.     Review of Systems CNS: No sedation or confusion Cardiac: No angina or palpitations GI: No constipation or abdominal pain  Objective:  BP (!) 163/72   Pulse 85   Temp 97.9 F (36.6 C)   Resp 18   Ht 5\' 4"  (1.626 m)   Wt 170 lb (77.1 kg)   SpO2 97%   BMI 29.18 kg/m    BP Readings from Last 3 Encounters:  10/06/17 (!) 163/72  09/23/17 130/61  09/17/17 (!) 118/58     Wt Readings from Last 3 Encounters:  10/06/17 170 lb (77.1 kg)  09/17/17 172 lb (78 kg)  08/27/17 170 lb (77.1 kg)     Physical Exam Pt is alert and oriented PERRL EOMI HEART IS RRR no murmur or rub LCTA no wheezing or rhales MUSCULOSKELETAL reveals some persistent paraspinous muscle tenderness but no overt trigger points.  She has had a previous gluteal trigger point and this seems to be better at this time.  Her strength in lower extremities is at baseline with  good muscle tone and bulk at baseline.  Labs  Lab Results  Component Value Date   HGBA1C 7.8 09/17/2017   HGBA1C 6.9 06/02/2017   HGBA1C 7.5 01/27/2017   Lab Results  Component Value Date   MICROALBUR 20 06/02/2017   LDLCALC 204 (H) 02/18/2017   CREATININE 1.11 (H) 04/18/2017    -------------------------------------------------------------------------------------------------------------------- Lab Results  Component Value Date   WBC 9.1 04/18/2017   HGB 13.7 04/18/2017   HCT 40.2 04/18/2017   PLT 162 04/18/2017   GLUCOSE 100 (H) 04/18/2017   CHOL 300 (H) 02/18/2017   TRIG 172 (H) 02/18/2017   HDL 62 02/18/2017   LDLCALC 204 (H) 02/18/2017   ALT 19 04/18/2017   AST 25 04/18/2017   NA 133 (L) 04/18/2017   K 4.0 04/18/2017    CL 99 (L) 04/18/2017   CREATININE 1.11 (H) 04/18/2017   BUN 27 (H) 04/18/2017   CO2 24 04/18/2017   TSH 2.220 06/10/2017   HGBA1C 7.8 09/17/2017   MICROALBUR 20 06/02/2017    --------------------------------------------------------------------------------------------------------------------- No results found.   Assessment & Plan:   Jasmine Webster was seen today for back pain.  Diagnoses and all orders for this visit:  Spinal stenosis of lumbar region with neurogenic claudication -     Lumbar Epidural Injection; Future  Sciatica of left side -     Lumbar Epidural Injection -     Lumbar Epidural Injection; Future  Low back pain at multiple sites  Facet arthritis of lumbosacral region [facility])  Sciatica of right side -     Lumbar Epidural Injection; Future  Other orders -     Discontinue: triamcinolone acetonide (KENALOG-40) injection 40 mg -     Discontinue: sodium chloride flush (NS) 0.9 % injection 10 mL -     Discontinue: ropivacaine (PF) 2 mg/mL (0.2%) (NAROPIN) injection 10 mL -     Discontinue: lidocaine (PF) (XYLOCAINE) 1 % injection 5 mL -     Discontinue: iopamidol (ISOVUE-M) 41 % intrathecal injection 20 mL        ----------------------------------------------------------------------------------------------------------------------  Problem List Items Addressed This Visit    None    Visit Diagnoses    Spinal stenosis of lumbar region with neurogenic claudication    -  Primary   Relevant Orders   Lumbar Epidural Injection   Sciatica of left side       Relevant Orders   Lumbar Epidural Injection   Low back pain at multiple sites       Facet arthritis of lumbosacral region [facility])       Sciatica of right side       Relevant Orders   Lumbar Epidural Injection        ----------------------------------------------------------------------------------------------------------------------  1. Sciatica of left side We will proceed with a repeat caudal epidural  injection today.  We have gone over the risks and benefits of the procedure with her in full detail all questions are answered.  She is to return to clinic in 2 months and we will schedule her for a repeat caudal at that time. - Lumbar Epidural Injection - Lumbar Epidural Injection; Future  2. Spinal stenosis of lumbar region with neurogenic claudication As above - Lumbar Epidural Injection; Future  3. Low back pain at multiple sites As above  4. Facet arthritis of lumbosacral region [facility]) Continue with walking and core strengthening exercises  5. Sciatica of right side As above - Lumbar Epidural Injection; Future    ----------------------------------------------------------------------------------------------------------------------  I am having Wallene Dales. Andree Elk  maintain her gabapentin, ranitidine, amLODipine, quinapril-hydrochlorothiazide, Insulin Glargine, glucose blood, onetouch ultrasoft, ALPRAZolam, omega-3 acid ethyl esters, glucose blood, Insulin Pen Needle, and omeprazole. We administered triamcinolone acetonide, sodium chloride flush, ropivacaine (PF) 2 mg/mL (0.2%), lidocaine (PF), and iopamidol.   Meds ordered this encounter  Medications  . DISCONTD: triamcinolone acetonide (KENALOG-40) injection 40 mg  . DISCONTD: sodium chloride flush (NS) 0.9 % injection 10 mL  . DISCONTD: ropivacaine (PF) 2 mg/mL (0.2%) (NAROPIN) injection 10 mL  . DISCONTD: lidocaine (PF) (XYLOCAINE) 1 % injection 5 mL  . DISCONTD: iopamidol (ISOVUE-M) 41 % intrathecal injection 20 mL     Medication List        Accurate as of 10/06/17  3:15 PM. Always use your most recent med list.          ALPRAZolam 0.5 MG tablet Commonly known as:  XANAX TAKE ONE TABLET (0.5 MG TOTAL) BY MOUTH AT BEDTIME AS NEEDED   amLODipine 5 MG tablet Commonly known as:  NORVASC TAKE 1 TABLET BY MOUTH EVERY DAY   FIFTY50 PEN NEEDLES 31G X 8 MM Misc Generic drug:  Insulin Pen Needle   gabapentin 100 MG  capsule Commonly known as:  NEURONTIN   * [employer] TEST STRIPS test strip Generic drug:  glucose blood   * glucose blood test strip Commonly known as:  ONE TOUCH ULTRA TEST Check sugar twice daily.   Insulin Glargine 100 UNIT/ML Solostar Pen Commonly known as:  LANTUS SOLOSTAR INJECT 28 UNITS SUBCUTANEOUSLY AT BEDTIME   omega-3 acid ethyl esters 1 g capsule Commonly known as:  LOVAZA   omeprazole 20 MG capsule Commonly known as:  PRILOSEC   onetouch ultrasoft lancets Check sugar twice daily DX E11.9   quinapril-hydrochlorothiazide 20-12.5 MG tablet Commonly known as:  ACCURETIC TAKE 1 TABLET BY MOUTH TWICE A DAY   ranitidine 150 MG tablet Commonly known as:  ZANTAC Take 1 tablet (150 mg total) by mouth 2 (two) times daily.      * This list has 2 medication(s) that are the same as other medications prescribed for you. Read the directions carefully, and ask your doctor or other care provider to review them with you.         ----------------------------------------------------------------------------------------------------------------------  Follow-up: Return in about 3 months (around 01/06/2018) for evaluation, procedure.  Procedure: Caudal epidural steroid No. 5 02/2017 under fluoroscopic guidance without sedation  After informed consent was obtained and the risks benefits reviewed patient chose to pursue a caudal epidural steroid injection today. With the patient in the prone position and no IV sedation was with IV versed. Using AP and lateral fluoroscopic guidance I identified the area overlying the sacral hiatus. This area was broadly prepped with DuraPrep 3 and we utilized strict aseptic technique during the procedure. 1% lidocaine was infiltrated 2 cc using a 25-gauge needle overlying the sacral cornu and then using lateral fluoroscopic guidance I advanced an 18-gauge Touhy needle approximately 2 cm through the sacral hiatus. Confirmation was with 2 cc of Isovue yielding  good epidural spread and no evidence of IV or subarachnoid uptake. This was followed by an injection of 5 cc of saline mixed with 1 cc of ropivacaine 0.2% and 40 mg of triamcinolone. This was tolerated without difficulty the patient was convalesced and discharged home in stable condition for follow-up as mentioned. Colman Cater, MD

## 2017-10-21 ENCOUNTER — Encounter: Payer: Self-pay | Admitting: Podiatry

## 2017-10-21 ENCOUNTER — Ambulatory Visit: Payer: PPO | Admitting: Podiatry

## 2017-10-21 DIAGNOSIS — L989 Disorder of the skin and subcutaneous tissue, unspecified: Secondary | ICD-10-CM | POA: Diagnosis not present

## 2017-10-23 NOTE — Progress Notes (Signed)
   Subjective: Patient with diabetes mellitus presents to the office today for follow-up evaluation of a painful callus lesion of the left fifth toe that has been present for the past year. Patient states that the pain is affecting her ability to ambulate without pain. Wearing closed toe shoes makes the pain worse.  There are no alleviating factors noted.  Patient presents today for further treatment and evaluation.   Past Medical History:  Diagnosis Date  . Arthritis   . Cataract    cataract removal bilaterally approx 20 years ago  . Chronic back pain   . Diabetes mellitus without complication ([place])     Objective:  Physical Exam General: Alert and oriented x3 in no acute distress  Dermatology: Hyperkeratotic lesion present on the left great toe. Pain on palpation with a central nucleated core noted.  Skin is warm, dry and supple bilateral lower extremities. Negative for open lesions or macerations.  Vascular: Palpable pedal pulses bilaterally. No edema or erythema noted. Capillary refill within normal limits.  Neurological: Epicritic and protective threshold diminished bilaterally.   Musculoskeletal Exam: Pain on palpation at the keratotic lesion noted. Range of motion within normal limits bilateral. Muscle strength 5/5 in all groups bilateral.  Assessment: #1 Diabetes mellitus w/ peripheral neuropathy #2 Pre-ulcerative callus lesion left fifth toe #3 Pain in feet   Plan of Care:  #1 Patient evaluated #2 Excisional debridement of keratotic lesion using a chisel blade was performed without incident.  #3 Dressed area with light dressing. #4 Patient is to return to the clinic PRN.    Edrick Kins, DPM [facility]  Dr. Edrick Kins, [address]                                        [place], [place] [address]                Office [phone]  Fax [phone]

## 2017-10-27 ENCOUNTER — Encounter: Payer: PPO | Admitting: Anesthesiology

## 2017-11-03 ENCOUNTER — Ambulatory Visit (HOSPITAL_BASED_OUTPATIENT_CLINIC_OR_DEPARTMENT_OTHER): Payer: PPO | Admitting: Anesthesiology

## 2017-11-03 ENCOUNTER — Encounter: Payer: Self-pay | Admitting: Anesthesiology

## 2017-11-03 ENCOUNTER — Ambulatory Visit
Admission: RE | Admit: 2017-11-03 | Discharge: 2017-11-03 | Disposition: A | Discharge status: Home / Self Care | Payer: PPO | Source: Ambulatory Visit | Attending: Anesthesiology | Admitting: Anesthesiology

## 2017-11-03 ENCOUNTER — Other Ambulatory Visit: Payer: Self-pay | Admitting: Anesthesiology

## 2017-11-03 ENCOUNTER — Other Ambulatory Visit: Payer: Self-pay

## 2017-11-03 VITALS — BP 167/63 | HR 86 | Temp 97.8°F | Resp 16 | Ht 60.0 in | Wt 170.0 lb

## 2017-11-03 DIAGNOSIS — M545 Low back pain, unspecified: Secondary | ICD-10-CM

## 2017-11-03 DIAGNOSIS — Z794 Long term (current) use of insulin: Secondary | ICD-10-CM | POA: Diagnosis not present

## 2017-11-03 DIAGNOSIS — M48062 Spinal stenosis, lumbar region with neurogenic claudication: Secondary | ICD-10-CM

## 2017-11-03 DIAGNOSIS — M4687 Other specified inflammatory spondylopathies, lumbosacral region: Secondary | ICD-10-CM | POA: Insufficient documentation

## 2017-11-03 DIAGNOSIS — M47817 Spondylosis without myelopathy or radiculopathy, lumbosacral region: Secondary | ICD-10-CM

## 2017-11-03 DIAGNOSIS — R52 Pain, unspecified: Secondary | ICD-10-CM

## 2017-11-03 DIAGNOSIS — M5431 Sciatica, right side: Secondary | ICD-10-CM | POA: Insufficient documentation

## 2017-11-03 DIAGNOSIS — Z79899 Other long term (current) drug therapy: Secondary | ICD-10-CM | POA: Insufficient documentation

## 2017-11-03 DIAGNOSIS — M5432 Sciatica, left side: Secondary | ICD-10-CM

## 2017-11-03 DIAGNOSIS — M4697 Unspecified inflammatory spondylopathy, lumbosacral region: Secondary | ICD-10-CM

## 2017-11-03 MED ORDER — SODIUM CHLORIDE 0.9 % IJ SOLN
INTRAMUSCULAR | Status: AC
Start: 2017-11-03 — End: 2017-11-03
  Filled 2017-11-03: qty 10

## 2017-11-03 MED ORDER — TRIAMCINOLONE ACETONIDE 40 MG/ML IJ SUSP
40.0000 mg | Freq: Once | INTRAMUSCULAR | Status: AC
Start: 2017-11-03 — End: 2017-11-03
  Administered 2017-11-03: 40 mg
  Filled 2017-11-03: qty 1

## 2017-11-03 MED ORDER — ROPIVACAINE HCL 2 MG/ML IJ SOLN
10.0000 mL | Freq: Once | INTRAMUSCULAR | Status: AC
  Administered 2017-11-03: 10 mL via EPIDURAL
  Filled 2017-11-03: qty 10

## 2017-11-03 MED ORDER — IOPAMIDOL (ISOVUE-M 200) INJECTION 41%
20.0000 mL | Freq: Once | INTRAMUSCULAR | Status: DC | PRN
  Administered 2017-11-03: 10 mL
  Filled 2017-11-03: qty 20

## 2017-11-03 MED ORDER — SODIUM CHLORIDE 0.9% FLUSH
10.0000 mL | Freq: Once | INTRAVENOUS | Status: AC
  Administered 2017-11-03: 10 mL

## 2017-11-03 MED ORDER — IOPAMIDOL (ISOVUE-M 200) INJECTION 41%
INTRAMUSCULAR | Status: AC
  Filled 2017-11-03: qty 10

## 2017-11-03 MED ORDER — LIDOCAINE HCL (PF) 1 % IJ SOLN
5.0000 mL | Freq: Once | INTRAMUSCULAR | Status: AC
  Administered 2017-11-03: 5 mL via SUBCUTANEOUS
  Filled 2017-11-03: qty 5

## 2017-11-03 NOTE — Progress Notes (Signed)
Subjective:  Patient ID: Jasmine Webster, female    DOB: 1926/04/28  Age: 83 y.o. MRN: [id number]  CC: Back Pain (right, lower)   Procedure: Caudal epidural steroid under fluoroscopic guidance without sedation  HPI Jasmine Webster presents for reevaluation.  She was last seen a few months ago and has had a series of caudal epidural steroids.  Unfortunately her pain continues to remain quite recalcitrant.  She generally gets about 6-8 weeks of relief with the caudal epidural steroid injections.  She presents today requesting the same.  No change in lower extremity strength or function or bowel or bladder function is noted at this time.  Unfortunately no other conservative therapy has helped to give her much relief.  He generally uses Tylenol or Naprosyn for pain relief.  Outpatient Medications Prior to Visit  Medication Sig Dispense Refill  . ALPRAZolam (XANAX) 0.5 MG tablet TAKE ONE TABLET (0.5 MG TOTAL) BY MOUTH AT BEDTIME AS NEEDED 30 tablet 5  . amLODipine (NORVASC) 5 MG tablet TAKE 1 TABLET BY MOUTH EVERY DAY 90 tablet 3  . gabapentin (NEURONTIN) 100 MG capsule 200 mg 2 (two) times daily.     Marland Kitchen glucose blood ([employer] TEST STRIPS) test strip Check sugar twice daily.    Marland Kitchen glucose blood (ONE TOUCH ULTRA TEST) test strip Check sugar twice daily. 100 each 3  . Insulin Glargine (LANTUS SOLOSTAR) 100 UNIT/ML Solostar Pen INJECT 28 UNITS SUBCUTANEOUSLY AT BEDTIME 27 pen 3  . Insulin Pen Needle (FIFTY50 PEN NEEDLES) 31G X 8 MM MISC Check sugar 2 times daily, DX E11.9 (needs ultra fine pen needles)    . Lancets (ONETOUCH ULTRASOFT) lancets Check sugar twice daily DX E11.9 100 each 12  . omega-3 acid ethyl esters (LOVAZA) 1 g capsule Take by mouth.    Marland Kitchen omeprazole (PRILOSEC) 20 MG capsule TAKE 1 CAPSULE BY MOUTH EVERY DAY    . quinapril-hydrochlorothiazide (ACCURETIC) 20-12.5 MG tablet TAKE 1 TABLET BY MOUTH TWICE A DAY 180 tablet 3  . ranitidine (ZANTAC) 150 MG tablet Take 1 tablet (150 mg total)  by mouth 2 (two) times daily. 60 tablet 12   No facility-administered medications prior to visit.     Review of Systems CNS: No confusion or sedation Cardiac: No angina or palpitations GI: No abdominal pain or constipation CNS: No confusion or sedation Cardiac: No angina or palpitation GI: No constipation or melena  Objective:  BP (!) 167/63   Pulse 86   Temp 97.8 F (36.6 C) (Oral)   Resp 16   Ht 5' (1.524 m)   Wt 170 lb (77.1 kg)   SpO2 100%   BMI 33.20 kg/m    BP Readings from Last 3 Encounters:  11/03/17 (!) 167/63  10/06/17 (!) 154/69  09/23/17 130/61     Wt Readings from Last 3 Encounters:  11/03/17 170 lb (77.1 kg)  10/06/17 170 lb (77.1 kg)  09/17/17 172 lb (78 kg)     Physical Exam Pt is alert and oriented PERRL EOMI HEART IS RRR no murmur or rub LCTA no wheezing or rales MUSCULOSKELETAL reveals some paraspinous muscle tenderness in the lumbar region and a well-healed midline scar.  Her muscle tone and bulk to the lower extremities remains at baseline.  Labs  Lab Results  Component Value Date   HGBA1C 7.8 09/17/2017   HGBA1C 6.9 06/02/2017   HGBA1C 7.5 01/27/2017   Lab Results  Component Value Date   MICROALBUR 20 06/02/2017   LDLCALC 204 (H)  02/18/2017   CREATININE 1.11 (H) 04/18/2017    -------------------------------------------------------------------------------------------------------------------- Lab Results  Component Value Date   WBC 9.1 04/18/2017   HGB 13.7 04/18/2017   HCT 40.2 04/18/2017   PLT 162 04/18/2017   GLUCOSE 100 (H) 04/18/2017   CHOL 300 (H) 02/18/2017   TRIG 172 (H) 02/18/2017   HDL 62 02/18/2017   LDLCALC 204 (H) 02/18/2017   ALT 19 04/18/2017   AST 25 04/18/2017   NA 133 (L) 04/18/2017   K 4.0 04/18/2017   CL 99 (L) 04/18/2017   CREATININE 1.11 (H) 04/18/2017   BUN 27 (H) 04/18/2017   CO2 24 04/18/2017   TSH 2.220 06/10/2017   HGBA1C 7.8 09/17/2017   MICROALBUR 20 06/02/2017     --------------------------------------------------------------------------------------------------------------------- Dg C-arm 1-60 Min-no Report  Result Date: 11/03/2017 Fluoroscopy was utilized by the requesting physician.  No radiographic interpretation.     Assessment & Plan:   Jasmine Webster was seen today for back pain.  Diagnoses and all orders for this visit:  Low back pain at multiple sites  Sciatica of left side -     Lumbar Epidural Injection  Spinal stenosis of lumbar region with neurogenic claudication -     Lumbar Epidural Injection -     Lumbar Epidural Injection; Future  Sciatica of right side -     Lumbar Epidural Injection -     Lumbar Epidural Injection; Future  Facet arthritis of lumbosacral region ([place])  Other orders -     triamcinolone acetonide (KENALOG-40) injection 40 mg -     sodium chloride flush (NS) 0.9 % injection 10 mL -     ropivacaine (PF) 2 mg/mL (0.2%) (NAROPIN) injection 10 mL -     lidocaine (PF) (XYLOCAINE) 1 % injection 5 mL -     iopamidol (ISOVUE-M) 41 % intrathecal injection 20 mL        ----------------------------------------------------------------------------------------------------------------------  Problem List Items Addressed This Visit    None    Visit Diagnoses    Low back pain at multiple sites    -  Primary   Relevant Medications   triamcinolone acetonide (KENALOG-40) injection 40 mg (Completed)   Sciatica of left side       Spinal stenosis of lumbar region with neurogenic claudication       Relevant Medications   triamcinolone acetonide (KENALOG-40) injection 40 mg (Completed)   Other Relevant Orders   Lumbar Epidural Injection   Sciatica of right side       Relevant Orders   Lumbar Epidural Injection   Facet arthritis of lumbosacral region ([facility])       Relevant Medications   triamcinolone acetonide (KENALOG-40) injection 40 mg (Completed)         ----------------------------------------------------------------------------------------------------------------------  1. Sciatica of left side We will proceed with a repeat caudal epidural steroid injection.  Unfortunately she continues to have recurrence of pain despite interventional therapy.  We talked about continuing to proceed with caudal epidurals every 2-3 months and the risks of epidural steroid administration.  She desires to proceed with a repeat caudal today as she is getting good relief from these generally lasting 6-8 weeks. - Lumbar Epidural Injection  2. Spinal stenosis of lumbar region with neurogenic claudication As above - Lumbar Epidural Injection - Lumbar Epidural Injection; Future  3. Sciatica of right side As above - Lumbar Epidural Injection - Lumbar Epidural Injection; Future  4. Low back pain at multiple sites Continue with back stretching strengthening exercises as tolerated  5. Facet  arthritis of lumbosacral region [facility])     ----------------------------------------------------------------------------------------------------------------------  I am having Jasmine Webster. [place] maintain her gabapentin, ranitidine, amLODipine, quinapril-hydrochlorothiazide, Insulin Glargine, glucose blood, onetouch ultrasoft, ALPRAZolam, omega-3 acid ethyl esters, glucose blood, Insulin Pen Needle, and omeprazole. We administered triamcinolone acetonide, sodium chloride flush, ropivacaine (PF) 2 mg/mL (0.2%), lidocaine (PF), and iopamidol.   Meds ordered this encounter  Medications  . triamcinolone acetonide (KENALOG-40) injection 40 mg  . sodium chloride flush (NS) 0.9 % injection 10 mL  . ropivacaine (PF) 2 mg/mL (0.2%) (NAROPIN) injection 10 mL  . lidocaine (PF) (XYLOCAINE) 1 % injection 5 mL  . iopamidol (ISOVUE-M) 41 % intrathecal injection 20 mL     Medication List        Accurate as of 11/03/17  5:27 PM. Always use your most recent med list.           ALPRAZolam 0.5 MG tablet Commonly known as:  XANAX TAKE ONE TABLET (0.5 MG TOTAL) BY MOUTH AT BEDTIME AS NEEDED   amLODipine 5 MG tablet Commonly known as:  NORVASC TAKE 1 TABLET BY MOUTH EVERY DAY   FIFTY50 PEN NEEDLES 31G X 8 MM Misc Generic drug:  Insulin Pen Needle   gabapentin 100 MG capsule Commonly known as:  NEURONTIN   * [employer] TEST STRIPS test strip Generic drug:  glucose blood   * glucose blood test strip Commonly known as:  ONE TOUCH ULTRA TEST Check sugar twice daily.   Insulin Glargine 100 UNIT/ML Solostar Pen Commonly known as:  LANTUS SOLOSTAR INJECT 28 UNITS SUBCUTANEOUSLY AT BEDTIME   omega-3 acid ethyl esters 1 g capsule Commonly known as:  LOVAZA   omeprazole 20 MG capsule Commonly known as:  PRILOSEC   onetouch ultrasoft lancets Check sugar twice daily DX E11.9   quinapril-hydrochlorothiazide 20-12.5 MG tablet Commonly known as:  ACCURETIC TAKE 1 TABLET BY MOUTH TWICE A DAY   ranitidine 150 MG tablet Commonly known as:  ZANTAC Take 1 tablet (150 mg total) by mouth 2 (two) times daily.      * This list has 2 medication(s) that are the same as other medications prescribed for you. Read the directions carefully, and ask your doctor or other care provider to review them with you.         ----------------------------------------------------------------------------------------------------------------------  Follow-up: Return for evaluation, procedure.  Caudal epidural steroid injection under fluoroscopic guidance without sedation  After informed consent was obtained and the risks benefits reviewed patient chose to pursue a caudal epidural steroid injection today. With the patient in the prone position.. Using AP and lateral fluoroscopic guidance I identified the area overlying the sacral hiatus. This area was broadly prepped with DuraPrep 3 and we utilized strict aseptic technique during the procedure. 1% lidocaine was infiltrated 2 cc using a  25-gauge needle overlying the sacral cornu and then using lateral fluoroscopic guidance I advanced an 18-gauge Touhy needle approximately 2 cm through the sacral hiatus. Confirmation was with 2 cc of Isovue yielding good epidural spread and no evidence of IV or subarachnoid uptake. This was followed by an injection of 5 cc of saline mixed with 1 cc of ropivacaine 0.2% and 40 mg of triamcinolone. This was tolerated without difficulty the patient was convalesced and discharged home in stable condition for follow-up as mentioned. Colman Cater, MD

## 2017-11-03 NOTE — Patient Instructions (Signed)
Pain Management Discharge Instructions  General Discharge Instructions :  If you need to reach your doctor call: Monday-Friday 8:00 am - 4:00 pm at [phone] or toll free [phone].  After clinic hours [phone] to have operator reach doctor.  Bring all of your medication bottles to all your appointments in the pain clinic.  To cancel or reschedule your appointment with Pain Management please remember to call 24 hours in advance to avoid a fee.  Refer to the educational materials which you have been given on: General Risks, I had my Procedure. Discharge Instructions, Post Sedation.  Post Procedure Instructions:   Please notify your doctor immediately if you have any unusual bleeding, trouble breathing or pain that is not related to your normal pain.  Depending on the type of procedure that was done, some parts of your body may feel week and/or numb.  This usually clears up by tonight or the next day.  Walk with the use of an assistive device or accompanied by an adult for the 24 hours.  You may use ice on the affected area for the first 24 hours.  Put ice in a Ziploc bag and cover with a towel and place against area 15 minutes on 15 minutes off.  You may switch to heat after 24 hours.

## 2017-11-03 NOTE — Progress Notes (Signed)
Safety precautions to be maintained throughout the outpatient stay will include: orient to surroundings, keep bed in low position, maintain call bell within reach at all times, provide assistance with transfer out of bed and ambulation.   Does not take Hydrocodone prescribed by Dr. Andree Elk, it does not help the pain and causes nausea.

## 2017-11-04 ENCOUNTER — Telehealth: Payer: Self-pay | Admitting: *Deleted

## 2017-11-04 NOTE — Telephone Encounter (Signed)
No problems post procedure. 

## 2017-11-19 ENCOUNTER — Ambulatory Visit: Payer: PPO | Admitting: Anesthesiology

## 2017-11-21 ENCOUNTER — Encounter: Payer: Self-pay | Admitting: Podiatry

## 2017-11-21 ENCOUNTER — Ambulatory Visit: Payer: PPO | Admitting: Podiatry

## 2017-11-21 ENCOUNTER — Ambulatory Visit (INDEPENDENT_AMBULATORY_CARE_PROVIDER_SITE_OTHER): Payer: PPO

## 2017-11-21 ENCOUNTER — Other Ambulatory Visit: Payer: Self-pay | Admitting: Podiatry

## 2017-11-21 DIAGNOSIS — M779 Enthesopathy, unspecified: Secondary | ICD-10-CM

## 2017-11-21 DIAGNOSIS — L989 Disorder of the skin and subcutaneous tissue, unspecified: Secondary | ICD-10-CM | POA: Diagnosis not present

## 2017-11-21 DIAGNOSIS — M2042 Other hammer toe(s) (acquired), left foot: Secondary | ICD-10-CM

## 2017-11-23 NOTE — Progress Notes (Signed)
   Subjective: Patient with diabetes mellitus presents to the office today for follow-up evaluation of a painful callus lesion of the left fifth toe.  She states the pain is still present and unchanged.  She wants to discuss surgery.  Patient presents today for further treatment and evaluation.   Past Medical History:  Diagnosis Date  . Arthritis   . Cataract    cataract removal bilaterally approx 20 years ago  . Chronic back pain   . Diabetes mellitus without complication ([place])     Objective:  Physical Exam General: Alert and oriented x3 in no acute distress  Dermatology: Hyperkeratotic lesion present on the left great toe. Pain on palpation with a central nucleated core noted.  Skin is warm, dry and supple bilateral lower extremities. Negative for open lesions or macerations.  Vascular: Palpable pedal pulses bilaterally. No edema or erythema noted. Capillary refill within normal limits.  Neurological: Epicritic and protective threshold diminished bilaterally.   Musculoskeletal Exam: Pain on palpation at the keratotic lesion noted. All pedal and ankle joints range of motion within normal limits bilateral. Muscle strength 5/5 in all groups bilateral. Hammertoe contracture deformity noted to the fifth digit of the left foot  Radiographic Exam: Hammertoe contracture deformity noted to the interphalangeal joints and MPJ of the respective hammertoe digits mentioned on clinical musculoskeletal exam.    Assessment: #1  Hammertoe fifth digit left foot #2  Callus lesion fifth digit left foot  Plan of Care:  1. Today the patient was evaluated. 2. Today we discussed the conservative versus surgical management of the presenting pathology. The patient opts for surgical management. All possible complications and details of the procedure were explained. All patient questions were answered. No guarantees were expressed or implied. 3. Authorization for surgery was initiated today. Surgery will  consist of PIPJ arthroplasty fifth toe left foot. 4.  Surgical shoe dispensed. 5.  Return to clinic 1 week postop.    Edrick Kins, DPM [facility]  Dr. Edrick Kins, [place]                                        [place], [place] [address]                Office [phone]  Fax [phone]

## 2017-11-24 ENCOUNTER — Telehealth: Payer: Self-pay | Admitting: *Deleted

## 2017-11-24 NOTE — Telephone Encounter (Signed)
"  This is the third time I have called.  I am calling to schedule an appointment."

## 2017-11-24 NOTE — Telephone Encounter (Signed)
"  Dr. Amalia Hailey wants me to get an appointment for surgery on my little toe.  Would you please give me a call?"

## 2017-11-24 NOTE — Telephone Encounter (Signed)
I can schedule you for January 31.  "That will be fine."

## 2017-11-26 NOTE — Telephone Encounter (Signed)
I am calling in regards to your surgery.  "I spoke to you yesterday and we scheduled surgery for January 31.  I'd like to do it sooner if I can."  He can do it on January 24.  "I'll take it.  You'll call me back with a time?"  Someone from the surgical center will call you with the arrival time a day or two prior to the surgery date.

## 2017-11-30 ENCOUNTER — Other Ambulatory Visit: Payer: Self-pay | Admitting: Family Medicine

## 2017-11-30 DIAGNOSIS — F419 Anxiety disorder, unspecified: Secondary | ICD-10-CM

## 2017-12-03 ENCOUNTER — Ambulatory Visit (HOSPITAL_BASED_OUTPATIENT_CLINIC_OR_DEPARTMENT_OTHER): Payer: PPO | Admitting: Anesthesiology

## 2017-12-03 ENCOUNTER — Other Ambulatory Visit: Payer: Self-pay | Admitting: Anesthesiology

## 2017-12-03 ENCOUNTER — Encounter: Payer: Self-pay | Admitting: Anesthesiology

## 2017-12-03 ENCOUNTER — Other Ambulatory Visit: Payer: Self-pay

## 2017-12-03 ENCOUNTER — Ambulatory Visit
Admission: RE | Admit: 2017-12-03 | Discharge: 2017-12-03 | Disposition: A | Discharge status: Home / Self Care | Payer: PPO | Source: Ambulatory Visit | Attending: Anesthesiology | Admitting: Anesthesiology

## 2017-12-03 VITALS — BP 158/70 | HR 72 | Temp 97.6°F | Resp 18 | Ht 60.0 in | Wt 170.0 lb

## 2017-12-03 DIAGNOSIS — M47817 Spondylosis without myelopathy or radiculopathy, lumbosacral region: Secondary | ICD-10-CM | POA: Diagnosis not present

## 2017-12-03 DIAGNOSIS — M961 Postlaminectomy syndrome, not elsewhere classified: Secondary | ICD-10-CM

## 2017-12-03 DIAGNOSIS — M5431 Sciatica, right side: Secondary | ICD-10-CM | POA: Diagnosis not present

## 2017-12-03 DIAGNOSIS — Z79899 Other long term (current) drug therapy: Secondary | ICD-10-CM | POA: Diagnosis not present

## 2017-12-03 DIAGNOSIS — M545 Low back pain, unspecified: Secondary | ICD-10-CM

## 2017-12-03 DIAGNOSIS — M5441 Lumbago with sciatica, right side: Secondary | ICD-10-CM | POA: Diagnosis not present

## 2017-12-03 DIAGNOSIS — M48062 Spinal stenosis, lumbar region with neurogenic claudication: Secondary | ICD-10-CM | POA: Diagnosis not present

## 2017-12-03 DIAGNOSIS — Z794 Long term (current) use of insulin: Secondary | ICD-10-CM | POA: Diagnosis not present

## 2017-12-03 DIAGNOSIS — R52 Pain, unspecified: Secondary | ICD-10-CM

## 2017-12-03 DIAGNOSIS — Z79891 Long term (current) use of opiate analgesic: Secondary | ICD-10-CM | POA: Diagnosis not present

## 2017-12-03 DIAGNOSIS — M25551 Pain in right hip: Secondary | ICD-10-CM | POA: Diagnosis present

## 2017-12-03 DIAGNOSIS — M4697 Unspecified inflammatory spondylopathy, lumbosacral region: Secondary | ICD-10-CM

## 2017-12-03 MED ORDER — TRIAMCINOLONE ACETONIDE 40 MG/ML IJ SUSP
40.0000 mg | Freq: Once | INTRAMUSCULAR | Status: AC
  Administered 2017-12-03: 40 mg
  Filled 2017-12-03: qty 1

## 2017-12-03 MED ORDER — HYDROCODONE-ACETAMINOPHEN 5-325 MG PO TABS: 1.0000 | ORAL_TABLET | Freq: Two times a day (BID) | ORAL | 0 refills | Status: DC

## 2017-12-03 MED ORDER — LIDOCAINE HCL (PF) 1 % IJ SOLN
5.0000 mL | Freq: Once | INTRAMUSCULAR | Status: AC
  Administered 2017-12-03: 5 mL via SUBCUTANEOUS
  Filled 2017-12-03: qty 5

## 2017-12-03 MED ORDER — IOPAMIDOL (ISOVUE-M 200) INJECTION 41%
20.0000 mL | Freq: Once | INTRAMUSCULAR | Status: DC | PRN
  Administered 2017-12-03: 10 mL
  Filled 2017-12-03: qty 20

## 2017-12-03 MED ORDER — SODIUM CHLORIDE 0.9% FLUSH
10.0000 mL | Freq: Once | INTRAVENOUS | Status: AC
  Administered 2017-12-03: 10 mL

## 2017-12-03 MED ORDER — ROPIVACAINE HCL 2 MG/ML IJ SOLN
10.0000 mL | Freq: Once | INTRAMUSCULAR | Status: AC
  Administered 2017-12-03: 10 mL via EPIDURAL
  Filled 2017-12-03: qty 10

## 2017-12-03 NOTE — Progress Notes (Signed)
Safety precautions to be maintained throughout the outpatient stay will include: orient to surroundings, keep bed in low position, maintain call bell within reach at all times, provide assistance with transfer out of bed and ambulation.  

## 2017-12-03 NOTE — Progress Notes (Signed)
Subjective:  Patient ID: Jasmine Webster, female    DOB: Jan 24, 1926  Age: 84 y.o. MRN: [id number]  CC: Hip Pain (right) and Groin Pain (right)   Procedure: Caudal epidural steroid No. 3 of series with no sedation  HPI Jasmine Webster presents for reevaluation today.  She was last seen approximately a month ago and had her second epidural at that time.  Prior to that she was having significant left side lower leg pain and this has abated and she reports it is significantly better than before her back pain is 50% improved and her primary complaint is of right sided calf and leg pain.  This is intermittent in nature with no associated weakness.  Her bowel and bladder function have been stable as well.  Based on her narcotic assessment sheet she continues to do well and is out of Vicodin at this time.  Her last prescription was many months ago.  She has noted that she is required to take this more frequently over the past few months.  She generally uses it no more than twice a day if at all.  She reports that it is well received with minimal side effects noted.  Her bowel bladder function or lower extremity strength and function been stable in nature.  Outpatient Medications Prior to Visit  Medication Sig Dispense Refill  . ALPRAZolam (XANAX) 0.5 MG tablet TAKE 1 TABLET BY MOUTH AT BEDTIME AS NEEDED 30 tablet 2  . amLODipine (NORVASC) 5 MG tablet TAKE 1 TABLET BY MOUTH EVERY DAY 90 tablet 3  . gabapentin (NEURONTIN) 100 MG capsule 200 mg 2 (two) times daily.     Marland Kitchen glucose blood ([employer] TEST STRIPS) test strip Check sugar twice daily.    Marland Kitchen glucose blood (ONE TOUCH ULTRA TEST) test strip Check sugar twice daily. 100 each 3  . Insulin Glargine (LANTUS SOLOSTAR) 100 UNIT/ML Solostar Pen INJECT 28 UNITS SUBCUTANEOUSLY AT BEDTIME 27 pen 3  . Insulin Pen Needle (FIFTY50 PEN NEEDLES) 31G X 8 MM MISC Check sugar 2 times daily, DX E11.9 (needs ultra fine pen needles)    . Lancets (ONETOUCH ULTRASOFT) lancets  Check sugar twice daily DX E11.9 100 each 12  . omega-3 acid ethyl esters (LOVAZA) 1 g capsule Take by mouth.    Marland Kitchen omeprazole (PRILOSEC) 20 MG capsule TAKE 1 CAPSULE BY MOUTH EVERY DAY    . quinapril-hydrochlorothiazide (ACCURETIC) 20-12.5 MG tablet TAKE 1 TABLET BY MOUTH TWICE A DAY 180 tablet 3  . ranitidine (ZANTAC) 150 MG tablet Take 1 tablet (150 mg total) by mouth 2 (two) times daily. 60 tablet 12   No facility-administered medications prior to visit.     Review of Systems CNS: No confusion or sedation Cardiac: No angina or palpitations GI: No abdominal pain or constipation Constitutional: No nausea vomiting fever or chills  Objective:  BP (!) 158/70   Pulse 72   Temp 97.6 F (36.4 C) (Oral)   Resp 18   Ht 5' (1.524 m)   Wt 170 lb (77.1 kg)   SpO2 97%   BMI 33.20 kg/m    BP Readings from Last 3 Encounters:  12/03/17 (!) 158/70  11/03/17 (!) 167/63  10/06/17 (!) 154/69     Wt Readings from Last 3 Encounters:  12/03/17 170 lb (77.1 kg)  11/03/17 170 lb (77.1 kg)  10/06/17 170 lb (77.1 kg)     Physical Exam Pt is alert and oriented PERRL EOMI HEART IS RRR no murmur or rub  LCTA no wheezing or rales MUSCULOSKELETAL feels some paraspinous muscle tenderness.  She is ambulating with a mildly antalgic gait and her lower extremity muscle tone and bulk is at baseline  Labs  Lab Results  Component Value Date   HGBA1C 7.8 09/17/2017   HGBA1C 6.9 06/02/2017   HGBA1C 7.5 01/27/2017   Lab Results  Component Value Date   MICROALBUR 20 06/02/2017   LDLCALC 204 (H) 02/18/2017   CREATININE 1.11 (H) 04/18/2017    -------------------------------------------------------------------------------------------------------------------- Lab Results  Component Value Date   WBC 9.1 04/18/2017   HGB 13.7 04/18/2017   HCT 40.2 04/18/2017   PLT 162 04/18/2017   GLUCOSE 100 (H) 04/18/2017   CHOL 300 (H) 02/18/2017   TRIG 172 (H) 02/18/2017   HDL 62 02/18/2017   LDLCALC  204 (H) 02/18/2017   ALT 19 04/18/2017   AST 25 04/18/2017   NA 133 (L) 04/18/2017   K 4.0 04/18/2017   CL 99 (L) 04/18/2017   CREATININE 1.11 (H) 04/18/2017   BUN 27 (H) 04/18/2017   CO2 24 04/18/2017   TSH 2.220 06/10/2017   HGBA1C 7.8 09/17/2017   MICROALBUR 20 06/02/2017    --------------------------------------------------------------------------------------------------------------------- Dg C-arm 1-60 Min-no Report  Result Date: 12/03/2017 Fluoroscopy was utilized by the requesting physician.  No radiographic interpretation.     Assessment & Plan:   Natisha was seen today for hip pain and groin pain.  Diagnoses and all orders for this visit:  Sciatica of right side  Spinal stenosis of lumbar region with neurogenic claudication  Low back pain at multiple sites  Facet arthritis of lumbosacral region [facility])  Failed back surgical syndrome  Other orders -     triamcinolone acetonide (KENALOG-40) injection 40 mg -     sodium chloride flush (NS) 0.9 % injection 10 mL -     ropivacaine (PF) 2 mg/mL (0.2%) (NAROPIN) injection 10 mL -     lidocaine (PF) (XYLOCAINE) 1 % injection 5 mL -     iopamidol (ISOVUE-M) 41 % intrathecal injection 20 mL -     Discontinue: HYDROcodone-acetaminophen (NORCO/VICODIN) 5-325 MG tablet; Take 1 tablet by mouth 2 (two) times daily. -     HYDROcodone-acetaminophen (NORCO/VICODIN) 5-325 MG tablet; Take 1 tablet by mouth 2 (two) times daily.        ----------------------------------------------------------------------------------------------------------------------  Problem List Items Addressed This Visit    None    Visit Diagnoses    Sciatica of right side    -  Primary   Spinal stenosis of lumbar region with neurogenic claudication       Relevant Medications   triamcinolone acetonide (KENALOG-40) injection 40 mg (Completed)   HYDROcodone-acetaminophen (NORCO/VICODIN) 5-325 MG tablet   Low back pain at multiple sites       Relevant  Medications   triamcinolone acetonide (KENALOG-40) injection 40 mg (Completed)   HYDROcodone-acetaminophen (NORCO/VICODIN) 5-325 MG tablet   Facet arthritis of lumbosacral region (HCC)       Relevant Medications   triamcinolone acetonide (KENALOG-40) injection 40 mg (Completed)   HYDROcodone-acetaminophen (NORCO/VICODIN) 5-325 MG tablet   Failed back surgical syndrome       Relevant Medications   triamcinolone acetonide (KENALOG-40) injection 40 mg (Completed)   HYDROcodone-acetaminophen (NORCO/VICODIN) 5-325 MG tablet        ----------------------------------------------------------------------------------------------------------------------  1. Sciatica of right side We will proceed with her third caudal epidural steroid today.  She seems to be doing well with these injections.  She is scheduled for a return to clinic in  2 months for refill of her medications and routine follow-up.  Otherwise we will have her continue with her walking and lower extremity stretching strengthening as tolerated.  I will go ahead and reinitiate hydrocodone 5 mg tablets 1 p.o. twice daily for this month and next month as necessary.  We have gone over the risks and benefits of acute and chronic opioid therapy.  2. Spinal stenosis of lumbar region with neurogenic claudication As above  3. Low back pain at multiple sites   4. Facet arthritis of lumbosacral region ([place])   5. Failed back surgical syndrome  6.  Chronic opioid maintenance therapy.. We have reviewed the [facility] practitioner database information and it is appropriate.    ----------------------------------------------------------------------------------------------------------------------  I am having Wallene Dales. Millett maintain her gabapentin, ranitidine, amLODipine, quinapril-hydrochlorothiazide, Insulin Glargine, glucose blood, onetouch ultrasoft, omega-3 acid ethyl esters, glucose blood, Insulin Pen Needle, omeprazole, ALPRAZolam,  and HYDROcodone-acetaminophen. We administered triamcinolone acetonide, sodium chloride flush, ropivacaine (PF) 2 mg/mL (0.2%), lidocaine (PF), and iopamidol.   Meds ordered this encounter  Medications  . triamcinolone acetonide (KENALOG-40) injection 40 mg  . sodium chloride flush (NS) 0.9 % injection 10 mL  . ropivacaine (PF) 2 mg/mL (0.2%) (NAROPIN) injection 10 mL  . lidocaine (PF) (XYLOCAINE) 1 % injection 5 mL  . iopamidol (ISOVUE-M) 41 % intrathecal injection 20 mL  . DISCONTD: HYDROcodone-acetaminophen (NORCO/VICODIN) 5-325 MG tablet    Sig: Take 1 tablet by mouth 2 (two) times daily.    Dispense:  60 tablet    Refill:  0  . HYDROcodone-acetaminophen (NORCO/VICODIN) 5-325 MG tablet    Sig: Take 1 tablet by mouth 2 (two) times daily.    Dispense:  60 tablet    Refill:  0    Do not fill prior to [id number]   Patient's Medications  New Prescriptions   HYDROCODONE-ACETAMINOPHEN (NORCO/VICODIN) 5-325 MG TABLET    Take 1 tablet by mouth 2 (two) times daily.  Previous Medications   ALPRAZOLAM (XANAX) 0.5 MG TABLET    TAKE 1 TABLET BY MOUTH AT BEDTIME AS NEEDED   AMLODIPINE (NORVASC) 5 MG TABLET    TAKE 1 TABLET BY MOUTH EVERY DAY   GABAPENTIN (NEURONTIN) 100 MG CAPSULE    200 mg 2 (two) times daily.    GLUCOSE BLOOD ([employer] TEST STRIPS) TEST STRIP    Check sugar twice daily.   GLUCOSE BLOOD (ONE TOUCH ULTRA TEST) TEST STRIP    Check sugar twice daily.   INSULIN GLARGINE (LANTUS SOLOSTAR) 100 UNIT/ML SOLOSTAR PEN    INJECT 28 UNITS SUBCUTANEOUSLY AT BEDTIME   INSULIN PEN NEEDLE (FIFTY50 PEN NEEDLES) 31G X 8 MM MISC    Check sugar 2 times daily, DX E11.9 (needs ultra fine pen needles)   LANCETS (ONETOUCH ULTRASOFT) LANCETS    Check sugar twice daily DX E11.9   OMEGA-3 ACID ETHYL ESTERS (LOVAZA) 1 G CAPSULE    Take by mouth.   OMEPRAZOLE (PRILOSEC) 20 MG CAPSULE    TAKE 1 CAPSULE BY MOUTH EVERY DAY   QUINAPRIL-HYDROCHLOROTHIAZIDE (ACCURETIC) 20-12.5 MG TABLET    TAKE 1 TABLET BY MOUTH  TWICE A DAY   RANITIDINE (ZANTAC) 150 MG TABLET    Take 1 tablet (150 mg total) by mouth 2 (two) times daily.  Modified Medications   No medications on file  Discontinued Medications   No medications on file   ----------------------------------------------------------------------------------------------------------------------  Follow-up: Return for evaluation, med refill.  Procedure: Caudal epidural steroid No. 3 under fluoroscopic guidance without sedation  After informed consent was obtained and the risks benefits reviewed patient chose to pursue a caudal epidural steroid injection today. With the patient in the prone position and an IV in place, sedation was with no IV versed. Using AP and lateral fluoroscopic guidance I identified the area overlying the sacral hiatus. This area was broadly prepped with DuraPrep 3 and we utilized strict aseptic technique during the procedure. 1% lidocaine was infiltrated 2 cc using a 25-gauge needle overlying the sacral cornu and then using lateral fluoroscopic guidance I advanced an 18-gauge Touhy needle approximately 2 cm through the sacral hiatus. Confirmation was with 2 cc of Isovue yielding good epidural spread and no evidence of IV or subarachnoid uptake. This was followed by an injection of 5 cc of saline mixed with 1 cc of ropivacaine 0.2% and 40 mg of triamcinolone. This was tolerated without difficulty the patient was convalesced and discharged home in stable condition for follow-up as mentioned. Colman Cater, MD

## 2017-12-04 ENCOUNTER — Telehealth: Payer: Self-pay | Admitting: *Deleted

## 2017-12-04 NOTE — Telephone Encounter (Signed)
Attempted to call for post procedure follow-up. Message left. 

## 2017-12-11 ENCOUNTER — Encounter: Payer: Self-pay | Admitting: Podiatry

## 2017-12-11 DIAGNOSIS — I10 Essential (primary) hypertension: Secondary | ICD-10-CM | POA: Diagnosis not present

## 2017-12-11 DIAGNOSIS — M2042 Other hammer toe(s) (acquired), left foot: Secondary | ICD-10-CM | POA: Diagnosis not present

## 2017-12-19 ENCOUNTER — Ambulatory Visit (INDEPENDENT_AMBULATORY_CARE_PROVIDER_SITE_OTHER): Payer: PPO | Admitting: Podiatry

## 2017-12-19 ENCOUNTER — Ambulatory Visit (INDEPENDENT_AMBULATORY_CARE_PROVIDER_SITE_OTHER): Payer: PPO

## 2017-12-19 ENCOUNTER — Encounter: Payer: Self-pay | Admitting: Podiatry

## 2017-12-19 VITALS — BP 114/54 | HR 71 | Temp 97.3°F

## 2017-12-19 DIAGNOSIS — Z9889 Other specified postprocedural states: Secondary | ICD-10-CM

## 2017-12-19 DIAGNOSIS — M2042 Other hammer toe(s) (acquired), left foot: Secondary | ICD-10-CM

## 2017-12-23 NOTE — Progress Notes (Signed)
   Subjective:  Patient presents today status post 5th toe arthroplasty left. DOS: 12/11/17. She states her incision looks well. She denies any pain at this time. Patient is here for further evaluation and treatment.    Past Medical History:  Diagnosis Date  . Arthritis   . Cataract    cataract removal bilaterally approx 20 years ago  . Chronic back pain   . Diabetes mellitus without complication ([place])       Objective/Physical Exam Neurovascular status intact.  Skin incisions appear to be well coapted with sutures and staples intact. No sign of infectious process noted. No dehiscence. No active bleeding noted. Moderate edema noted to the surgical extremity.  Radiographic Exam:  Osteotomies sites appear to be stable with routine healing.  Assessment: 1. s/p 5th toe arthroplasty left. DOS: 12/11/17   Plan of Care:  1. Patient was evaluated. X-rays reviewed 2. Dressing changed. 3. Weightbearing in post op shoe.  4. Return to clinic in 1 week for suture removal.    Edrick Kins, DPM [facility]  Dr. Edrick Kins, [address]                                        [place], [place] [address]                Office [phone]  Fax [phone]

## 2017-12-26 ENCOUNTER — Encounter: Payer: Self-pay | Admitting: Podiatry

## 2017-12-26 ENCOUNTER — Ambulatory Visit (INDEPENDENT_AMBULATORY_CARE_PROVIDER_SITE_OTHER): Payer: PPO | Admitting: Podiatry

## 2017-12-26 VITALS — BP 131/58 | HR 70 | Temp 97.7°F

## 2017-12-26 DIAGNOSIS — M2042 Other hammer toe(s) (acquired), left foot: Secondary | ICD-10-CM

## 2017-12-29 NOTE — Progress Notes (Signed)
   Subjective:  Patient presents today status post 5th toe arthroplasty left. DOS: 12/11/17. She states she is doing well overall. She reports a small nodule near the incision site. Patient is here for further evaluation and treatment.    Past Medical History:  Diagnosis Date  . Arthritis   . Cataract    cataract removal bilaterally approx 20 years ago  . Chronic back pain   . Diabetes mellitus without complication ([place])       Objective/Physical Exam Neurovascular status intact.  Skin incisions appear to be well coapted with sutures and staples intact. No sign of infectious process noted. No dehiscence. No active bleeding noted. Moderate edema noted to the surgical extremity.  Assessment: 1. s/p 5th toe arthroplasty left. DOS: 12/11/17 - doing well   Plan of Care:  1. Patient was evaluated.  2. Sutures removed.  3. Continue using antibiotic ointment daily with a Band-Aid.  4. Transition into good sneakers.  5. Return to clinic in 4 weeks.    Edrick Kins, DPM [facility]  Dr. Edrick Kins, [address]                                        [place], [place] [address]                Office [phone]  Fax [phone]

## 2018-01-13 ENCOUNTER — Other Ambulatory Visit: Payer: Self-pay | Admitting: Family Medicine

## 2018-01-13 DIAGNOSIS — R110 Nausea: Secondary | ICD-10-CM

## 2018-01-13 DIAGNOSIS — K219 Gastro-esophageal reflux disease without esophagitis: Secondary | ICD-10-CM

## 2018-01-15 ENCOUNTER — Ambulatory Visit (INDEPENDENT_AMBULATORY_CARE_PROVIDER_SITE_OTHER): Payer: PPO | Admitting: Family Medicine

## 2018-01-15 ENCOUNTER — Encounter: Payer: Self-pay | Admitting: Family Medicine

## 2018-01-15 VITALS — BP 120/56 | HR 78 | Temp 98.0°F | Resp 16 | Wt 166.0 lb

## 2018-01-15 DIAGNOSIS — M7582 Other shoulder lesions, left shoulder: Secondary | ICD-10-CM | POA: Diagnosis not present

## 2018-01-15 DIAGNOSIS — I10 Essential (primary) hypertension: Secondary | ICD-10-CM | POA: Diagnosis not present

## 2018-01-15 DIAGNOSIS — E119 Type 2 diabetes mellitus without complications: Secondary | ICD-10-CM | POA: Diagnosis not present

## 2018-01-15 DIAGNOSIS — M778 Other enthesopathies, not elsewhere classified: Secondary | ICD-10-CM

## 2018-01-15 NOTE — Progress Notes (Signed)
Patient: Jasmine Webster Female    DOB: [birth year]   84 y.o.   MRN: [id number] Visit Date: 01/15/2018  Today's Provider: [name], MD   Chief Complaint  Patient presents with  . Diabetes  . Hypertension   Subjective:    HPI  Diabetes Mellitus Type II, Follow-up:   Lab Results  Component Value Date   HGBA1C 7.8 09/17/2017   HGBA1C 6.9 06/02/2017   HGBA1C 7.5 01/27/2017    Last seen for diabetes 3 months ago.  Management since then includes none. She reports good compliance with treatment. She is not having side effects.  Home blood sugar records: 98-106  Episodes of hypoglycemia? no   Current Insulin Regimen: 26 units of Lantus Most Recent Eye Exam: 09/05/17  Pertinent Labs:    Component Value Date/Time   CHOL 300 (H) 02/18/2017 0840   TRIG 172 (H) 02/18/2017 0840   HDL 62 02/18/2017 0840   LDLCALC 204 (H) 02/18/2017 0840   CREATININE 1.11 (H) 04/18/2017 1122    Wt Readings from Last 3 Encounters:  01/15/18 166 lb (75.3 kg)  12/03/17 170 lb (77.1 kg)  11/03/17 170 lb (77.1 kg)   ------------------------------------------------------------------------ Pt feels well. No issues,complaints.    Allergies  Allergen Reactions  . Etodolac Nausea Only, Other (See Comments) and Anxiety    Dizziness, too  . Naproxen Other (See Comments) and Anxiety    Gave her an ulcer after taking it for years  . Statins Other (See Comments)    No energy and myalgias  . Latex Rash  . Terbinafine Other (See Comments) and Rash    "Maybe made me nervous"  . Terbinafine Hcl Rash     Current Outpatient Medications:  .  ALPRAZolam (XANAX) 0.5 MG tablet, TAKE 1 TABLET BY MOUTH AT BEDTIME AS NEEDED, Disp: 30 tablet, Rfl: 2 .  amLODipine (NORVASC) 5 MG tablet, TAKE 1 TABLET BY MOUTH EVERY DAY, Disp: 90 tablet, Rfl: 3 .  glucose blood ([employer] TEST STRIPS) test strip, Check sugar twice daily., Disp: , Rfl:  .  glucose blood (ONE TOUCH ULTRA TEST) test strip, Check  sugar twice daily., Disp: 100 each, Rfl: 3 .  Insulin Glargine (LANTUS SOLOSTAR) 100 UNIT/ML Solostar Pen, INJECT 28 UNITS SUBCUTANEOUSLY AT BEDTIME, Disp: 27 pen, Rfl: 3 .  Insulin Pen Needle (FIFTY50 PEN NEEDLES) 31G X 8 MM MISC, Check sugar 2 times daily, DX E11.9 (needs ultra fine pen needles), Disp: , Rfl:  .  Lancets (ONETOUCH ULTRASOFT) lancets, Check sugar twice daily DX E11.9, Disp: 100 each, Rfl: 12 .  omega-3 acid ethyl esters (LOVAZA) 1 g capsule, Take by mouth., Disp: , Rfl:  .  omeprazole (PRILOSEC) 20 MG capsule, TAKE 1 CAPSULE BY MOUTH EVERY DAY, Disp: , Rfl:  .  quinapril-hydrochlorothiazide (ACCURETIC) 20-12.5 MG tablet, TAKE 1 TABLET BY MOUTH TWICE A DAY, Disp: 180 tablet, Rfl: 3 .  gabapentin (NEURONTIN) 100 MG capsule, 200 mg 2 (two) times daily. , Disp: , Rfl:  .  HYDROcodone-acetaminophen (NORCO/VICODIN) 5-325 MG tablet, Take 1 tablet by mouth 2 (two) times daily., Disp: 60 tablet, Rfl: 0 .  ranitidine (ZANTAC) 150 MG tablet, TAKE 1 TABLET (150 MG TOTAL) BY MOUTH 2 (TWO) TIMES DAILY. (Patient not taking: Reported on 01/15/2018), Disp: 60 tablet, Rfl: 4  Review of Systems  Constitutional: Negative.   HENT: Negative.   Eyes: Negative.   Respiratory: Negative.   Cardiovascular: Negative.   Gastrointestinal: Negative.   Endocrine: Negative.  Genitourinary: Negative.   Musculoskeletal: Positive for arthralgias.       Left shoulder discomfort with movement.  Skin: Negative.   Allergic/Immunologic: Negative.   Neurological: Negative.   Hematological: Negative.   Psychiatric/Behavioral: Negative.     Social History   Tobacco Use  . Smoking status: Never Smoker  . Smokeless tobacco: Never Used  Substance Use Topics  . Alcohol use: No    Alcohol/week: 0.0 oz   Objective:   BP (!) 120/56 (BP Location: Left Arm, Patient Position: Sitting, Cuff Size: Normal)   Pulse 78   Temp 98 F (36.7 C) (Oral)   Resp 16   Wt 166 lb (75.3 kg)   BMI 32.42 kg/m  Vitals:    01/15/18 1107  BP: (!) 120/56  Pulse: 78  Resp: 16  Temp: 98 F (36.7 C)  TempSrc: Oral  Weight: 166 lb (75.3 kg)     Physical Exam  Constitutional: She is oriented to person, place, and time. She appears well-developed and well-nourished.  HENT:  Head: Normocephalic and atraumatic.  Eyes: Conjunctivae are normal.  Neck: No thyromegaly present.  Cardiovascular: Normal rate, regular rhythm and normal heart sounds.  Pulmonary/Chest: Effort normal and breath sounds normal.  Abdominal: Soft.  Musculoskeletal: She exhibits no deformity.  Mild tenderness over area of  biceps tendon left shoulder.  Neurological: She is alert and oriented to person, place, and time.  Skin: Skin is warm and dry.  Psychiatric: She has a normal mood and affect. Her behavior is normal. Judgment and thought content normal.        Assessment & Plan:     1. Type 2 diabetes mellitus without complication, unspecified whether long term insulin use (HCC) Good control--no hypoglycemia. - POCT HgB A1C--6.7 today.  2. Essential (primary) hypertension  3.Left shoulder tendonitis Conservative care--refer if needed.     I have done the exam and reviewed the above chart and it is accurate to the best of my knowledge. Development worker, community has been used in this note in any air is in the dictation or transcription are unintentional.  [name], MD  [place]

## 2018-01-23 ENCOUNTER — Encounter: Payer: PPO | Admitting: Podiatry

## 2018-01-23 ENCOUNTER — Ambulatory Visit (INDEPENDENT_AMBULATORY_CARE_PROVIDER_SITE_OTHER): Payer: PPO

## 2018-01-23 ENCOUNTER — Encounter: Payer: Self-pay | Admitting: Podiatry

## 2018-01-23 ENCOUNTER — Ambulatory Visit (INDEPENDENT_AMBULATORY_CARE_PROVIDER_SITE_OTHER): Payer: PPO | Admitting: Podiatry

## 2018-01-23 DIAGNOSIS — M2042 Other hammer toe(s) (acquired), left foot: Secondary | ICD-10-CM

## 2018-01-23 DIAGNOSIS — Z9889 Other specified postprocedural states: Secondary | ICD-10-CM

## 2018-01-23 LAB — POCT GLYCOSYLATED HEMOGLOBIN (HGB A1C): HEMOGLOBIN A1C: 6.7

## 2018-01-26 NOTE — Progress Notes (Signed)
   Subjective:  Patient presents today status post 5th toe arthroplasty left. DOS: 12/11/17. She states the knot that she noticed at last visit has decreased in size. She states the toe is feeling well and she has been wearing good sneakers. Patient is here for further evaluation and treatment.    Past Medical History:  Diagnosis Date  . Arthritis   . Cataract    cataract removal bilaterally approx 20 years ago  . Chronic back pain   . Diabetes mellitus without complication ([place])       Objective/Physical Exam Neurovascular status intact.  Skin incisions appear to be well coapted. No sign of infectious process noted. No dehiscence. No active bleeding noted. Moderate edema noted to the surgical extremity.  Radiographic Exam:  Osteotomies sites appear to be stable with routine healing.  Assessment: 1. s/p 5th toe arthroplasty left. DOS: 12/11/17 - doing well   Plan of Care:  1. Patient was evaluated. X-Rays reviewed.  2. May resume full duty with no restrictions.  3. Continue wearing good shoes.  4. Return to clinic as needed.    Edrick Kins, DPM [facility]  Dr. Edrick Kins, [address]                                        [place], [place] [address]                Office [phone]  Fax [phone]

## 2018-02-24 ENCOUNTER — Other Ambulatory Visit: Payer: Self-pay | Admitting: Family Medicine

## 2018-02-25 ENCOUNTER — Other Ambulatory Visit: Payer: Self-pay | Admitting: Family Medicine

## 2018-03-05 ENCOUNTER — Other Ambulatory Visit: Payer: Self-pay | Admitting: Family Medicine

## 2018-03-05 DIAGNOSIS — F419 Anxiety disorder, unspecified: Secondary | ICD-10-CM

## 2018-03-17 ENCOUNTER — Telehealth: Payer: Self-pay | Admitting: Family Medicine

## 2018-03-18 ENCOUNTER — Ambulatory Visit (INDEPENDENT_AMBULATORY_CARE_PROVIDER_SITE_OTHER): Payer: PPO | Admitting: Physician Assistant

## 2018-03-18 ENCOUNTER — Encounter: Payer: Self-pay | Admitting: Physician Assistant

## 2018-03-18 VITALS — BP 120/60 | HR 79 | Temp 98.4°F | Wt 161.4 lb

## 2018-03-18 DIAGNOSIS — E119 Type 2 diabetes mellitus without complications: Secondary | ICD-10-CM

## 2018-03-18 DIAGNOSIS — R300 Dysuria: Secondary | ICD-10-CM | POA: Diagnosis not present

## 2018-03-18 LAB — POCT URINALYSIS DIPSTICK
Bilirubin, UA: NEGATIVE
Blood, UA: NEGATIVE
Glucose, UA: NEGATIVE
Ketones, UA: NEGATIVE
Nitrite, UA: NEGATIVE
Protein, UA: NEGATIVE
Spec Grav, UA: 1.015 (ref 1.010–1.025)
Urobilinogen, UA: 0.2 E.U./dL
pH, UA: 6 (ref 5.0–8.0)

## 2018-03-18 MED ORDER — CEPHALEXIN 500 MG PO CAPS: 500.0000 mg | ORAL_CAPSULE | Freq: Two times a day (BID) | ORAL | 0 refills | Status: AC

## 2018-03-18 NOTE — Patient Instructions (Signed)

## 2018-03-18 NOTE — Progress Notes (Signed)
Patient: Jasmine Webster Female    DOB: 12-19-1925   84 y.o.   MRN: [id number] Visit Date: 03/18/2018  Today's Provider: Trinna Post, PA-C   Chief Complaint  Patient presents with  . Dysuria   Subjective:    Jasmine Webster is an 84 y/o woman presenting today with below symptoms.  Dysuria   This is a new problem. Episode onset: 2 weeks ago. The problem occurs intermittently. The problem has been unchanged. The quality of the pain is described as burning. The pain is mild. Associated symptoms comments: Foul odor urine and lower back pain. She has tried nothing for the symptoms. There is no history of kidney stones or recurrent UTIs.       Allergies  Allergen Reactions  . Etodolac Nausea Only, Other (See Comments) and Anxiety    Dizziness, too  . Naproxen Other (See Comments) and Anxiety    Gave her an ulcer after taking it for years  . Statins Other (See Comments)    No energy and myalgias  . Latex Rash  . Terbinafine Other (See Comments) and Rash    "Maybe made me nervous"  . Terbinafine Hcl Rash     Current Outpatient Medications:  .  ALPRAZolam (XANAX) 0.5 MG tablet, TAKE 1 TABLET BY MOUTH AT BEDTIME AS NEEDED, Disp: 30 tablet, Rfl: 2 .  amLODipine (NORVASC) 5 MG tablet, TAKE 1 TABLET BY MOUTH EVERY DAY, Disp: 90 tablet, Rfl: 3 .  gabapentin (NEURONTIN) 100 MG capsule, 200 mg 2 (two) times daily. , Disp: , Rfl:  .  glucose blood ([employer] TEST STRIPS) test strip, Check sugar twice daily., Disp: , Rfl:  .  glucose blood (ONE TOUCH ULTRA TEST) test strip, Check sugar twice daily., Disp: 100 each, Rfl: 3 .  HYDROcodone-acetaminophen (NORCO/VICODIN) 5-325 MG tablet, Take 1 tablet by mouth 2 (two) times daily., Disp: 60 tablet, Rfl: 0 .  Insulin Glargine (LANTUS SOLOSTAR) 100 UNIT/ML Solostar Pen, INJECT 28 UNITS SUBCUTANEOUSLY AT BEDTIME, Disp: 27 pen, Rfl: 3 .  Insulin Pen Needle (FIFTY50 PEN NEEDLES) 31G X 8 MM MISC, Check sugar 2 times daily, DX E11.9 (needs ultra  fine pen needles), Disp: , Rfl:  .  Lancets (ONETOUCH ULTRASOFT) lancets, Check sugar twice daily DX E11.9, Disp: 100 each, Rfl: 12 .  omega-3 acid ethyl esters (LOVAZA) 1 g capsule, Take by mouth., Disp: , Rfl:  .  omeprazole (PRILOSEC) 20 MG capsule, TAKE 1 CAPSULE BY MOUTH EVERY DAY, Disp: , Rfl:  .  quinapril-hydrochlorothiazide (ACCURETIC) 20-12.5 MG tablet, TAKE 1 TABLET BY MOUTH TWICE A DAY, Disp: 180 tablet, Rfl: 3 .  ranitidine (ZANTAC) 150 MG tablet, TAKE 1 TABLET (150 MG TOTAL) BY MOUTH 2 (TWO) TIMES DAILY., Disp: 60 tablet, Rfl: 4 .  cephALEXin (KEFLEX) 500 MG capsule, Take 1 capsule (500 mg total) by mouth 2 (two) times daily for 7 days., Disp: 14 capsule, Rfl: 0  Review of Systems  Constitutional: Negative.   Respiratory: Negative.   Cardiovascular: Negative.   Genitourinary: Positive for dysuria.    Social History   Tobacco Use  . Smoking status: Never Smoker  . Smokeless tobacco: Never Used  Substance Use Topics  . Alcohol use: No    Alcohol/week: 0.0 oz   Objective:   BP 120/60 (BP Location: Left Arm, Patient Position: Sitting, Cuff Size: Normal)   Pulse 79   Temp 98.4 F (36.9 C) (Oral)   Wt 161 lb 6.4 oz (73.2 kg)  SpO2 95%   BMI 31.52 kg/m    Physical Exam  Constitutional: She is oriented to person, place, and time. She appears well-developed and well-nourished.  Cardiovascular: Normal rate and regular rhythm.  Pulmonary/Chest: Effort normal and breath sounds normal.  Abdominal: Soft. Bowel sounds are normal.  Neurological: She is alert and oriented to person, place, and time.  Skin: Skin is warm and dry.  Psychiatric: She has a normal mood and affect. Her behavior is normal.        Assessment & Plan:     1. Dysuria  Urine dipstick with only som eleukocytes. Not overly convinced for UTI but pt concerned, going out of town. Treat as below, call back with cx results.   - POCT Urinalysis Dipstick - cephALEXin (KEFLEX) 500 MG capsule; Take 1  capsule (500 mg total) by mouth 2 (two) times daily for 7 days.  Dispense: 14 capsule; Refill: 0 - Urine Culture  2. Type 2 diabetes mellitus without complication, unspecified whether long term insulin use (HCC)  Last A1c 7.8, managed by Dr. Rosanna Randy.   Return if symptoms worsen or fail to improve.  The entirety of the information documented in the History of Present Illness, Review of Systems and Physical Exam were personally obtained by me. Portions of this information were initially documented by Tiburcio Pea, CMA and reviewed by me for thoroughness and accuracy.             Trinna Post, PA-C  [facility]

## 2018-03-20 ENCOUNTER — Telehealth: Payer: Self-pay

## 2018-03-20 LAB — URINE CULTURE: Organism ID, Bacteria: NO GROWTH

## 2018-03-20 NOTE — Telephone Encounter (Signed)
Left message for karen to call back

## 2018-03-20 NOTE — Telephone Encounter (Signed)
-----   Message from Trinna Post, [place] sent at 03/20/2018  8:34 AM EDT ----- No growth, stop abx.

## 2018-03-23 NOTE — Telephone Encounter (Signed)
Tried calling; no answer.   Thanks,   -Laura  

## 2018-03-26 NOTE — Telephone Encounter (Signed)
Tried calling; pt's number is now disconnected.  Also tried calling Katherina Right (on the [facility]) her number was invalid.   Thanks,   -Mickel Baas

## 2018-03-31 NOTE — Telephone Encounter (Signed)
After several attempts to contact patient with no response back will mail letter home today. KW

## 2018-04-06 ENCOUNTER — Other Ambulatory Visit: Payer: Self-pay | Admitting: Family Medicine

## 2018-04-06 DIAGNOSIS — E118 Type 2 diabetes mellitus with unspecified complications: Secondary | ICD-10-CM

## 2018-04-14 ENCOUNTER — Other Ambulatory Visit: Payer: Self-pay | Admitting: Family Medicine

## 2018-05-12 ENCOUNTER — Telehealth: Payer: Self-pay | Admitting: Family Medicine

## 2018-05-12 ENCOUNTER — Ambulatory Visit (INDEPENDENT_AMBULATORY_CARE_PROVIDER_SITE_OTHER): Payer: PPO | Admitting: Family Medicine

## 2018-05-12 ENCOUNTER — Encounter: Payer: Self-pay | Admitting: Family Medicine

## 2018-05-12 VITALS — BP 124/72 | HR 72 | Temp 98.4°F | Resp 16 | Wt 163.0 lb

## 2018-05-12 DIAGNOSIS — E7800 Pure hypercholesterolemia, unspecified: Secondary | ICD-10-CM

## 2018-05-12 DIAGNOSIS — K219 Gastro-esophageal reflux disease without esophagitis: Secondary | ICD-10-CM | POA: Diagnosis not present

## 2018-05-12 DIAGNOSIS — M7061 Trochanteric bursitis, right hip: Secondary | ICD-10-CM

## 2018-05-12 DIAGNOSIS — M1990 Unspecified osteoarthritis, unspecified site: Secondary | ICD-10-CM | POA: Diagnosis not present

## 2018-05-12 DIAGNOSIS — M7062 Trochanteric bursitis, left hip: Secondary | ICD-10-CM

## 2018-05-12 DIAGNOSIS — I10 Essential (primary) hypertension: Secondary | ICD-10-CM

## 2018-05-12 DIAGNOSIS — E119 Type 2 diabetes mellitus without complications: Secondary | ICD-10-CM | POA: Diagnosis not present

## 2018-05-12 MED ORDER — PREDNISONE 5 MG (21) PO TBPK: ORAL_TABLET | ORAL | 0 refills | Status: DC

## 2018-05-12 NOTE — Telephone Encounter (Signed)
Pt was in today and seen Dr. Darnell Level.  She was prescribed a prednisolone pac  She does not know how to taper the pac.  Please advise  Pt's CB is [phone]  Con Memos

## 2018-05-12 NOTE — Telephone Encounter (Signed)
Advised  ED 

## 2018-05-12 NOTE — Progress Notes (Signed)
Patient: Jasmine Webster Female    DOB: [birth year]   84 y.o.   MRN: [id number] Visit Date: 05/12/2018  Today's Provider: [name], MD   Chief Complaint  Patient presents with  . Diabetes  . Hypertension   Subjective:    HPI  Diabetes Mellitus Type II, Follow-up:   Lab Results  Component Value Date   HGBA1C 6.7 01/23/2018   HGBA1C 7.8 09/17/2017   HGBA1C 6.9 06/02/2017    Last seen for diabetes 4 months ago.  Management since then includes no changes. She reports good compliance with treatment. She is not having side effects.  Current symptoms include none and have been stable. Home blood sugar records: trend: stable  Episodes of hypoglycemia? no   Current Insulin Regimen: 26-30 units daily.  Most Recent Eye Exam: due Weight trend: stable Prior visit with dietician: no Current diet: well balanced Current exercise: no regular exercise  Pertinent Labs:    Component Value Date/Time   CHOL 300 (H) 02/18/2017 0840   TRIG 172 (H) 02/18/2017 0840   HDL 62 02/18/2017 0840   LDLCALC 204 (H) 02/18/2017 0840   CREATININE 1.11 (H) 04/18/2017 1122    Wt Readings from Last 3 Encounters:  05/12/18 163 lb (73.9 kg)  03/18/18 161 lb 6.4 oz (73.2 kg)  01/15/18 166 lb (75.3 kg)    Hypertension, follow-up:  BP Readings from Last 3 Encounters:  05/12/18 124/72  03/18/18 120/60  01/15/18 (!) 120/56    She was last seen for hypertension 4 months ago.  BP at that visit was 120/72. Management since that visit includes no changes. She reports good compliance with treatment. She is not having side effects.  She is exercising. She is adherent to low salt diet.   Outside blood pressures are checked occasionally. She is experiencing none.  Patient denies exertional chest pressure/discomfort, lower extremity edema and palpitations.   Cardiovascular risk factors include diabetes mellitus and dyslipidemia.  Use of agents associated with hypertension: none.       Allergies  Allergen Reactions  . Etodolac Nausea Only, Other (See Comments) and Anxiety    Dizziness, too  . Naproxen Other (See Comments) and Anxiety    Gave her an ulcer after taking it for years  . Statins Other (See Comments)    No energy and myalgias  . Latex Rash  . Terbinafine Other (See Comments) and Rash    "Maybe made me nervous"  . Terbinafine Hcl Rash     Current Outpatient Medications:  .  ALPRAZolam (XANAX) 0.5 MG tablet, TAKE 1 TABLET BY MOUTH AT BEDTIME AS NEEDED, Disp: 30 tablet, Rfl: 2 .  amLODipine (NORVASC) 5 MG tablet, TAKE 1 TABLET BY MOUTH EVERY DAY, Disp: 90 tablet, Rfl: 3 .  gabapentin (NEURONTIN) 100 MG capsule, 200 mg 2 (two) times daily. , Disp: , Rfl:  .  glucose blood ([employer] TEST STRIPS) test strip, Check sugar twice daily., Disp: , Rfl:  .  glucose blood (ONE TOUCH ULTRA TEST) test strip, Check sugar twice daily., Disp: 100 each, Rfl: 3 .  glucose blood (ONE TOUCH ULTRA TEST) test strip, CHECK SUGAR TWICE A DAY, Disp: 200 each, Rfl: 3 .  HYDROcodone-acetaminophen (NORCO/VICODIN) 5-325 MG tablet, Take 1 tablet by mouth 2 (two) times daily., Disp: 60 tablet, Rfl: 0 .  Insulin Glargine (LANTUS SOLOSTAR) 100 UNIT/ML Solostar Pen, INJECT 28 UNITS SUBCUTANEOUSLY AT BEDTIME, Disp: 27 pen, Rfl: 3 .  Insulin Pen Needle (FIFTY50 PEN  NEEDLES) 31G X 8 MM MISC, Check sugar 2 times daily, DX E11.9 (needs ultra fine pen needles), Disp: , Rfl:  .  Lancets (ONETOUCH ULTRASOFT) lancets, Check sugar twice daily DX E11.9, Disp: 100 each, Rfl: 12 .  omega-3 acid ethyl esters (LOVAZA) 1 g capsule, Take by mouth., Disp: , Rfl:  .  omeprazole (PRILOSEC) 20 MG capsule, TAKE 1 CAPSULE BY MOUTH EVERY DAY, Disp: , Rfl:  .  quinapril-hydrochlorothiazide (ACCURETIC) 20-12.5 MG tablet, TAKE 1 TABLET BY MOUTH TWICE A DAY, Disp: 180 tablet, Rfl: 3 .  ranitidine (ZANTAC) 150 MG tablet, TAKE 1 TABLET (150 MG TOTAL) BY MOUTH 2 (TWO) TIMES DAILY., Disp: 60 tablet, Rfl: 4  Review of  Systems  Constitutional: Negative for activity change, appetite change, chills, diaphoresis, fatigue and fever.  HENT: Negative.   Eyes: Negative.   Respiratory: Positive for cough. Negative for shortness of breath.   Cardiovascular: Negative for chest pain, palpitations and leg swelling.  Musculoskeletal: Positive for arthralgias.       Bilateral 'hip pain". No groin pain.  Skin: Negative.   Allergic/Immunologic: Positive for environmental allergies.  Neurological: Negative for dizziness, weakness, light-headedness and headaches.  Hematological: Negative.   Psychiatric/Behavioral: Negative.     Social History   Tobacco Use  . Smoking status: Never Smoker  . Smokeless tobacco: Never Used  Substance Use Topics  . Alcohol use: No    Alcohol/week: 0.0 oz   Objective:   BP 124/72 (BP Location: Right Arm, Patient Position: Sitting, Cuff Size: Normal)   Pulse 72   Temp 98.4 F (36.9 C)   Resp 16   Wt 163 lb (73.9 kg)   SpO2 99%   BMI 31.83 kg/m  Vitals:   05/12/18 1101  BP: 124/72  Pulse: 72  Resp: 16  Temp: 98.4 F (36.9 C)  SpO2: 99%  Weight: 163 lb (73.9 kg)     Physical Exam  Constitutional: She is oriented to person, place, and time. She appears well-developed and well-nourished.  HENT:  Head: Normocephalic and atraumatic.  Right Ear: External ear normal.  Left Ear: External ear normal.  Nose: Nose normal.  Eyes: Conjunctivae are normal. No scleral icterus.  Neck: No thyromegaly present.  Cardiovascular: Normal rate, regular rhythm and normal heart sounds.  Pulmonary/Chest: Effort normal and breath sounds normal.  Abdominal: Soft.  Musculoskeletal: She exhibits tenderness. She exhibits no edema.  Mildly tender over both greater trochanters.  Neurological: She is alert and oriented to person, place, and time.  Skin: Skin is warm and dry.  Psychiatric: She has a normal mood and affect. Her behavior is normal. Judgment and thought content normal.          Assessment & Plan:     1. Type 2 diabetes mellitus without complication, unspecified whether long term insulin use (HCC) RTC 4 months. - CBC with Differential/Platelet - Hemoglobin A1c  2. Essential (primary) hypertension  - Comprehensive metabolic panel - TSH  3. Hypercholesteremia Statin intolerant. - Lipid panel  4. Gastroesophageal reflux disease, esophagitis presence not specified   5. Trochanteric bursitis of both hips Refer to orthho. - predniSONE (STERAPRED UNI-PAK 21 TAB) 5 MG (21) TBPK tablet; Taper as directed.  Dispense: 21 tablet; Refill: 0      I have done the exam and reviewed the above chart and it is accurate to the best of my knowledge. Development worker, community has been used in this note in any air is in the dictation or transcription are unintentional.  Richard Cranford Mon, MD  [facility]

## 2018-05-13 DIAGNOSIS — E7800 Pure hypercholesterolemia, unspecified: Secondary | ICD-10-CM | POA: Diagnosis not present

## 2018-05-13 DIAGNOSIS — E119 Type 2 diabetes mellitus without complications: Secondary | ICD-10-CM | POA: Diagnosis not present

## 2018-05-13 DIAGNOSIS — I10 Essential (primary) hypertension: Secondary | ICD-10-CM | POA: Diagnosis not present

## 2018-05-14 LAB — COMPREHENSIVE METABOLIC PANEL
ALT: 15 IU/L (ref 0–32)
AST: 20 IU/L (ref 0–40)
Albumin/Globulin Ratio: 2.2 (ref 1.2–2.2)
Albumin: 4.1 g/dL (ref 3.5–4.7)
Alkaline Phosphatase: 110 IU/L (ref 39–117)
BUN/Creatinine Ratio: 22 (ref 12–28)
BUN: 23 mg/dL (ref 8–27)
Bilirubin Total: 0.4 mg/dL (ref 0.0–1.2)
CALCIUM: 9.2 mg/dL (ref 8.7–10.3)
CO2: 22 mmol/L (ref 20–29)
CREATININE: 1.03 mg/dL — AB (ref 0.57–1.00)
Chloride: 99 mmol/L (ref 96–106)
GFR calc Af Amer: 56 mL/min/{1.73_m2} — ABNORMAL LOW (ref >59)
GFR, EST NON AFRICAN AMERICAN: 48 mL/min/{1.73_m2} — AB (ref >59)
GLOBULIN, TOTAL: 1.9 g/dL (ref 1.5–4.5)
GLUCOSE: 117 mg/dL — AB (ref 65–99)
Potassium: 4.2 mmol/L (ref 3.5–5.2)
SODIUM: 136 mmol/L (ref 134–144)
Total Protein: 6 g/dL (ref 6.0–8.5)

## 2018-05-14 LAB — TSH: TSH: 2.99 u[IU]/mL (ref 0.450–4.500)

## 2018-05-14 LAB — CBC WITH DIFFERENTIAL/PLATELET
BASOS: 1 %
Basophils Absolute: 0.1 10*3/uL (ref 0.0–0.2)
EOS (ABSOLUTE): 0.1 10*3/uL (ref 0.0–0.4)
Eos: 3 %
HEMATOCRIT: 35.6 % (ref 34.0–46.6)
Hemoglobin: 11.8 g/dL (ref 11.1–15.9)
IMMATURE GRANULOCYTES: 0 %
Immature Grans (Abs): 0 10*3/uL (ref 0.0–0.1)
Lymphocytes Absolute: 2 10*3/uL (ref 0.7–3.1)
Lymphs: 39 %
MCH: 31.3 pg (ref 26.6–33.0)
MCHC: 33.1 g/dL (ref 31.5–35.7)
MCV: 94 fL (ref 79–97)
Monocytes Absolute: 0.6 10*3/uL (ref 0.1–0.9)
Monocytes: 12 %
NEUTROS PCT: 45 %
Neutrophils Absolute: 2.3 10*3/uL (ref 1.4–7.0)
RBC: 3.77 x10E6/uL (ref 3.77–5.28)
RDW: 13.2 % (ref 12.3–15.4)
WBC: 5.2 10*3/uL (ref 3.4–10.8)

## 2018-05-14 LAB — HEMOGLOBIN A1C
Est. average glucose Bld gHb Est-mCnc: 148 mg/dL
Hgb A1c MFr Bld: 6.8 % — ABNORMAL HIGH (ref 4.8–5.6)

## 2018-05-14 LAB — LIPID PANEL
CHOL/HDL RATIO: 6.2 ratio — AB (ref 0.0–4.4)
CHOLESTEROL TOTAL: 273 mg/dL — AB (ref 100–199)
HDL: 44 mg/dL (ref >39)
LDL CALC: 185 mg/dL — AB (ref 0–99)
TRIGLYCERIDES: 220 mg/dL — AB (ref 0–149)
VLDL CHOLESTEROL CAL: 44 mg/dL — AB (ref 5–40)

## 2018-05-15 ENCOUNTER — Telehealth: Payer: Self-pay

## 2018-05-15 NOTE — Telephone Encounter (Signed)
-----   Message from Jerrol Banana., MD sent at 05/15/2018 11:39 AM EDT ----- Labs stable.

## 2018-05-15 NOTE — Telephone Encounter (Signed)
Tried calling patient. Left message to call back. 

## 2018-05-18 NOTE — Telephone Encounter (Signed)
Pt advised.   Thanks,   -Laura  

## 2018-06-15 ENCOUNTER — Other Ambulatory Visit: Payer: Self-pay | Admitting: Family Medicine

## 2018-06-15 DIAGNOSIS — F419 Anxiety disorder, unspecified: Secondary | ICD-10-CM

## 2018-06-16 ENCOUNTER — Telehealth: Payer: Self-pay

## 2018-06-16 NOTE — Telephone Encounter (Signed)
LM for pt to CB and schedule her AWV after 07/04/18. -MM

## 2018-06-18 ENCOUNTER — Other Ambulatory Visit: Payer: Self-pay | Admitting: Family Medicine

## 2018-06-24 NOTE — Telephone Encounter (Signed)
Scheduled 07/06/18 @ 1:40 PM.

## 2018-06-25 DIAGNOSIS — H3554 Dystrophies primarily involving the retinal pigment epithelium: Secondary | ICD-10-CM | POA: Diagnosis not present

## 2018-06-25 LAB — HM DIABETES EYE EXAM

## 2018-07-06 ENCOUNTER — Ambulatory Visit (INDEPENDENT_AMBULATORY_CARE_PROVIDER_SITE_OTHER): Payer: PPO

## 2018-07-06 VITALS — BP 120/52 | HR 90 | Temp 98.8°F | Ht 60.0 in | Wt 162.8 lb

## 2018-07-06 DIAGNOSIS — Z0000 Encounter for general adult medical examination without abnormal findings: Secondary | ICD-10-CM | POA: Diagnosis not present

## 2018-07-06 NOTE — Patient Instructions (Addendum)
Jasmine Webster , Thank you for taking time to come for your Medicare Wellness Visit. I appreciate your ongoing commitment to your health goals. Please review the following plan we discussed and let me know if I can assist you in the future.   Screening recommendations/referrals: Colonoscopy: N/A Recommended yearly ophthalmology/optometry visit for glaucoma screening and checkup Recommended yearly dental visit for hygiene and checkup  Vaccinations: Influenza vaccine: Up to date Pneumococcal vaccine: Up to date Tdap vaccine: Up to date Shingles vaccine: Pt declines today.     Advanced directives: Already on file.  Conditions/risks identified: None, pt declined exercise and states she drinks water and eats a balanced diet.  Next appointment: 2:00 PM today with Dr Rosanna Randy. Pt declined scheduling an AWV for 2020 at this time.   Preventive Care 19 Years and Older, Female Preventive care refers to lifestyle choices and visits with your health care provider that can promote health and wellness. What does preventive care include?  A yearly physical exam. This is also called an annual well check.  Dental exams once or twice a year.  Routine eye exams. Ask your health care provider how often you should have your eyes checked.  Personal lifestyle choices, including:  Daily care of your teeth and gums.  Regular physical activity.  Eating a healthy diet.  Avoiding tobacco and drug use.  Limiting alcohol use.  Practicing safe sex.  Taking low doses of aspirin every day.  Taking vitamin and mineral supplements as recommended by your health care provider. What happens during an annual well check? The services and screenings done by your health care provider during your annual well check will depend on your age, overall health, lifestyle risk factors, and family history of disease. Counseling  Your health care provider may ask you questions about your:  Alcohol use.  Tobacco use.  Drug  use.  Emotional well-being.  Home and relationship well-being.  Sexual activity.  Eating habits.  History of falls.  Memory and ability to understand (cognition).  Work and work Statistician. Screening  You may have the following tests or measurements:  Height, weight, and BMI.  Blood pressure.  Lipid and cholesterol levels. These may be checked every 5 years, or more frequently if you are over 71 years old.  Skin check.  Lung cancer screening. You may have this screening every year starting at age 71 if you have a 30-pack-year history of smoking and currently smoke or have quit within the past 15 years.  Fecal occult blood test (FOBT) of the stool. You may have this test every year starting at age 23.  Flexible sigmoidoscopy or colonoscopy. You may have a sigmoidoscopy every 5 years or a colonoscopy every 10 years starting at age 7.  Prostate cancer screening. Recommendations will vary depending on your family history and other risks.  Hepatitis C blood test.  Hepatitis B blood test.  Sexually transmitted disease (STD) testing.  Diabetes screening. This is done by checking your blood sugar (glucose) after you have not eaten for a while (fasting). You may have this done every 1-3 years.  Abdominal aortic aneurysm (AAA) screening. You may need this if you are a current or former smoker.  Osteoporosis. You may be screened starting at age 68 if you are at high risk. Talk with your health care provider about your test results, treatment options, and if necessary, the need for more tests. Vaccines  Your health care provider may recommend certain vaccines, such as:  Influenza vaccine. This  is recommended every year.  Tetanus, diphtheria, and acellular pertussis (Tdap, Td) vaccine. You may need a Td booster every 10 years.  Zoster vaccine. You may need this after age 32.  Pneumococcal 13-valent conjugate (PCV13) vaccine. One dose is recommended after age  58.  Pneumococcal polysaccharide (PPSV23) vaccine. One dose is recommended after age 15. Talk to your health care provider about which screenings and vaccines you need and how often you need them. This information is not intended to replace advice given to you by your health care provider. Make sure you discuss any questions you have with your health care provider. Document Released: 12/01/2015 Document Revised: 07/24/2016 Document Reviewed: 09/05/2015 Elsevier Interactive Patient Education  2017 [place] Prevention in the Home Falls can cause injuries. They can happen to people of all ages. There are many things you can do to make your home safe and to help prevent falls. What can I do on the outside of my home?  Regularly fix the edges of walkways and driveways and fix any cracks.  Remove anything that might make you trip as you walk through a door, such as a raised step or threshold.  Trim any bushes or trees on the path to your home.  Use bright outdoor lighting.  Clear any walking paths of anything that might make someone trip, such as rocks or tools.  Regularly check to see if handrails are loose or broken. Make sure that both sides of any steps have handrails.  Any raised decks and porches should have guardrails on the edges.  Have any leaves, snow, or ice cleared regularly.  Use sand or salt on walking paths during winter.  Clean up any spills in your garage right away. This includes oil or grease spills. What can I do in the bathroom?  Use night lights.  Install grab bars by the toilet and in the tub and shower. Do not use towel bars as grab bars.  Use non-skid mats or decals in the tub or shower.  If you need to sit down in the shower, use a plastic, non-slip stool.  Keep the floor dry. Clean up any water that spills on the floor as soon as it happens.  Remove soap buildup in the tub or shower regularly.  Attach bath mats securely with double-sided  non-slip rug tape.  Do not have throw rugs and other things on the floor that can make you trip. What can I do in the bedroom?  Use night lights.  Make sure that you have a light by your bed that is easy to reach.  Do not use any sheets or blankets that are too big for your bed. They should not hang down onto the floor.  Have a firm chair that has side arms. You can use this for support while you get dressed.  Do not have throw rugs and other things on the floor that can make you trip. What can I do in the kitchen?  Clean up any spills right away.  Avoid walking on wet floors.  Keep items that you use a lot in easy-to-reach places.  If you need to reach something above you, use a strong step stool that has a grab bar.  Keep electrical cords out of the way.  Do not use floor polish or wax that makes floors slippery. If you must use wax, use non-skid floor wax.  Do not have throw rugs and other things on the floor that can make you  trip. What can I do with my stairs?  Do not leave any items on the stairs.  Make sure that there are handrails on both sides of the stairs and use them. Fix handrails that are broken or loose. Make sure that handrails are as long as the stairways.  Check any carpeting to make sure that it is firmly attached to the stairs. Fix any carpet that is loose or worn.  Avoid having throw rugs at the top or bottom of the stairs. If you do have throw rugs, attach them to the floor with carpet tape.  Make sure that you have a light switch at the top of the stairs and the bottom of the stairs. If you do not have them, ask someone to add them for you. What else can I do to help prevent falls?  Wear shoes that:  Do not have high heels.  Have rubber bottoms.  Are comfortable and fit you well.  Are closed at the toe. Do not wear sandals.  If you use a stepladder:  Make sure that it is fully opened. Do not climb a closed stepladder.  Make sure that both  sides of the stepladder are locked into place.  Ask someone to hold it for you, if possible.  Clearly mark and make sure that you can see:  Any grab bars or handrails.  First and last steps.  Where the edge of each step is.  Use tools that help you move around (mobility aids) if they are needed. These include:  Canes.  Walkers.  Scooters.  Crutches.  Turn on the lights when you go into a dark area. Replace any light bulbs as soon as they burn out.  Set up your furniture so you have a clear path. Avoid moving your furniture around.  If any of your floors are uneven, fix them.  If there are any pets around you, be aware of where they are.  Review your medicines with your doctor. Some medicines can make you feel dizzy. This can increase your chance of falling. Ask your doctor what other things that you can do to help prevent falls. This information is not intended to replace advice given to you by your health care provider. Make sure you discuss any questions you have with your health care provider. Document Released: 08/31/2009 Document Revised: 04/11/2016 Document Reviewed: 12/09/2014 Elsevier Interactive Patient Education  2017 [employer].

## 2018-07-06 NOTE — Progress Notes (Signed)
Subjective:   Jasmine Webster is a 84 y.o. female who presents for Medicare Annual (Subsequent) preventive examination.  Review of Systems:  N/A  Cardiac Risk Factors include: advanced age (>62men, >74 women);diabetes mellitus;hypertension;obesity (BMI >30kg/m2)     Objective:     Vitals: BP (!) 120/52 (BP Location: Right Arm)   Pulse 90   Temp 98.8 F (37.1 C) (Oral)   Ht 5' (1.524 m)   Wt 162 lb 12.8 oz (73.8 kg)   BMI 31.79 kg/m   Body mass index is 31.79 kg/m.  Advanced Directives 07/06/2018 12/03/2017 08/27/2017 07/04/2017 06/23/2017 04/25/2017 04/18/2017  Does Patient Have a Medical Advance Directive? Yes Yes Yes Yes Yes Yes Yes  Type of Advance Directive Living will [place];Living will [place];Living will [place];Living will Healthcare Power of [place] in Chart? - No - copy requested - No - copy requested - No - copy requested -    Tobacco Social History   Tobacco Use  Smoking Status Never Smoker  Smokeless Tobacco Never Used     Counseling given: Not Answered   Clinical Intake:  Pre-visit preparation completed: Yes  Pain : No/denies pain Pain Score: 0-No pain     Nutritional Status: BMI > 30  Obese Nutritional Risks: None Diabetes: Yes(type 2) CBG done?: No Did pt. bring in CBG monitor from home?: No  How often do you need to have someone help you when you read instructions, pamphlets, or other written materials from your doctor or pharmacy?: 1 - Never  Interpreter Needed?: No  Information entered by :: [facility], LPN  Past Medical History:  Diagnosis Date  . Arthritis   . Cataract    cataract removal bilaterally approx 20 years ago  . Chronic back pain   . Diabetes mellitus without complication ([place])   . Hypertension    Past Surgical History:  Procedure Laterality Date  . ABDOMINAL HYSTERECTOMY    . APPENDECTOMY     . BACK SURGERY     three  . CATARACT EXTRACTION    . THORACOLUMBAR SYRINGO SHUNT    . TONSILLECTOMY AND ADENOIDECTOMY     Family History  Problem Relation Age of Onset  . Heart disease Mother        died from MI  . Heart disease Sister   . Sudden death Brother        shot and beaten to death during home invasion  . Stroke Sister   . Sudden death Sister        MVA  . Cancer Brother        died from lung cancer  . Sudden death Sister        25 days old  . Cancer Brother        died from lung cancer  . Stroke Brother        cause of death  . Diabetes Father   . Stroke Father   . Pneumonia Father    Social History   Socioeconomic History  . Marital status: Widowed    Spouse name: Not on file  . Number of children: 2  . Years of education: Not on file  . Highest education level: 8th grade  Occupational History  . Not on file  Social Needs  . Financial resource strain: Not hard at all  . Food insecurity:    Worry: Never true  Inability: Never true  . Transportation needs:    Medical: No    Non-medical: No  Tobacco Use  . Smoking status: Never Smoker  . Smokeless tobacco: Never Used  Substance and Sexual Activity  . Alcohol use: No    Alcohol/week: 0.0 standard drinks  . Drug use: No  . Sexual activity: Never  Lifestyle  . Physical activity:    Days per week: Not on file    Minutes per session: Not on file  . Stress: Not at all  Relationships  . Social connections:    Talks on phone: Not on file    Gets together: Not on file    Attends religious service: Not on file    Active member of club or organization: Not on file    Attends meetings of clubs or organizations: Not on file    Relationship status: Not on file  Other Topics Concern  . Not on file  Social History Narrative  . Not on file    Outpatient Encounter Medications as of 07/06/2018  Medication Sig  . ALPRAZolam (XANAX) 0.5 MG tablet TAKE 1 TABLET BY MOUTH EVERY DAY AT BEDTIME AS NEEDED    . amLODipine (NORVASC) 5 MG tablet TAKE 1 TABLET BY MOUTH EVERY DAY  . Artificial Tear Ointment (DRY EYES OP) Apply 1 drop to eye daily.  Marland Kitchen glucose blood (ONE TOUCH ULTRA TEST) test strip Check sugar twice daily.  . Insulin Glargine (LANTUS SOLOSTAR) 100 UNIT/ML Solostar Pen INJECT 28 UNITS SUBCUTANEOUSLY AT BEDTIME  . Insulin Pen Needle (B-D ULTRAFINE III SHORT PEN) 31G X 8 MM MISC CHECK SUGAR 2 TIMES DAILY, DX E11.9 (NEEDS ULTRA FINE PEN NEEDLES)  . Insulin Pen Needle (FIFTY50 PEN NEEDLES) 31G X 8 MM MISC Check sugar 2 times daily, DX E11.9 (needs ultra fine pen needles)  . Lancets (ONETOUCH ULTRASOFT) lancets Check sugar twice daily DX E11.9  . Multiple Vitamins-Minerals (PRESERVISION AREDS) CAPS Take 1 capsule by mouth 2 (two) times daily.   Marland Kitchen omega-3 acid ethyl esters (LOVAZA) 1 g capsule Take 1 g by mouth daily.   Marland Kitchen omeprazole (PRILOSEC) 20 MG capsule TAKE 1 CAPSULE BY MOUTH EVERY DAY  . quinapril-hydrochlorothiazide (ACCURETIC) 20-12.5 MG tablet TAKE 1 TABLET BY MOUTH TWICE A DAY  . ranitidine (ZANTAC) 150 MG tablet TAKE 1 TABLET (150 MG TOTAL) BY MOUTH 2 (TWO) TIMES DAILY. (Patient taking differently: Take 150 mg by mouth at bedtime. )  . gabapentin (NEURONTIN) 100 MG capsule 200 mg 2 (two) times daily.   Marland Kitchen glucose blood ([employer] TEST STRIPS) test strip Check sugar twice daily.  Marland Kitchen glucose blood (ONE TOUCH ULTRA TEST) test strip CHECK SUGAR TWICE A DAY (Patient not taking: Reported on 07/06/2018)  . HYDROcodone-acetaminophen (NORCO/VICODIN) 5-325 MG tablet Take 1 tablet by mouth 2 (two) times daily. (Patient not taking: Reported on 07/06/2018)  . predniSONE (STERAPRED UNI-PAK 21 TAB) 5 MG (21) TBPK tablet Taper as directed.   No facility-administered encounter medications on file as of 07/06/2018.     Activities of Daily Living In your present state of health, do you have any difficulty performing the following activities: 07/06/2018  Hearing? N  Vision? 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  Difficulty concentrating or making decisions? N  Walking or climbing stairs? Y  Comment Due to right hip pain.   Dressing or bathing? N  Doing errands, shopping? N  Preparing Food and eating ? N  Using the Toilet? N  In the past six months,  have you accidently leaked urine? N  Do you have problems with loss of bowel control? N  Managing your Medications? N  Managing your Finances? N  Housekeeping or managing your Housekeeping? N  Some recent data might be hidden    Patient Care Team: Jerrol Banana., MD as PCP - General (Family Medicine) Birder Robson, MD as Referring Physician (Ophthalmology)    Assessment:   This is a routine wellness examination for Jasmine Webster.  Exercise Activities and Dietary recommendations Current Exercise Habits: Home exercise routine, Type of exercise: stretching, Time (Minutes): 10(less than), Frequency (Times/Week): 6, Weekly Exercise (Minutes/Week): 60, Intensity: Mild, Exercise limited by: orthopedic condition(s)  Goals    . Exercise      Starting 09/06/16, I will increase my 10 minute exercises to twice a day.    . Exercise 3x per week (20 min per time)     Recommend exercising 3x per week (20 min per time).       Fall Risk Fall Risk  07/06/2018 12/03/2017 11/03/2017 10/06/2017 08/27/2017  Falls in the past year? No No No No No   Is the patient's home free of loose throw rugs in walkways, pet beds, electrical cords, etc?   yes      Grab bars in the bathroom? yes      Handrails on the stairs?   yes      Adequate lighting?   yes  Timed Get Up and Go performed: N/A  Depression Screen PHQ 2/9 Scores 07/06/2018 12/03/2017 11/03/2017 10/06/2017  PHQ - 2 Score 0 0 0 0  Exception Documentation - - - -     Cognitive Function: Pt declined screening today.      6CIT Screen 09/06/2016  What Year? 0 points  What month? 0 points  What time? 0 points  Count back from 20 0 points  Months in reverse 4 points  Repeat phrase 6  points  Total Score 10    Immunization History  Administered Date(s) Administered  . Influenza, High Dose Seasonal PF 09/04/2015, 08/29/2016  . Influenza-Unspecified 09/18/2013, 08/16/2017  . Pneumococcal Conjugate-13 12/27/2014  . Pneumococcal Polysaccharide-23 07/13/1998  . Td 03/29/2010    Qualifies for Shingles Vaccine? Due for Shingles vaccine. Declined my offer to administer today. Education has been provided regarding the importance of this vaccine. Pt has been advised to call her insurance company to determine her out of pocket expense. Advised she may also receive this vaccine at her local pharmacy or Health Dept. Verbalized acceptance and understanding.  Screening Tests Health Maintenance  Topic Date Due  . FOOT EXAM  05/08/2017  . INFLUENZA VACCINE  07/17/2018 (Originally 06/18/2018)  . HEMOGLOBIN A1C  11/12/2018  . OPHTHALMOLOGY EXAM  06/26/2019  . TETANUS/TDAP  03/29/2020  . DEXA SCAN  Completed  . PNA vac Low Risk Adult  Completed    Cancer Screenings: Lung: Low Dose CT Chest recommended if Age 54-80 years, 30 pack-year currently smoking OR have quit w/in 15years. Patient does not qualify. Breast:  Up to date on Mammogram? N/A Up to date of Bone Density/Dexa? N/A Colorectal: N/A  Additional Screenings:  Hepatitis C Screening: N/A     Plan:  I have personally reviewed and addressed the Medicare Annual Wellness questionnaire and have noted the following in the patient's chart:  A. Medical and social history B. Use of alcohol, tobacco or illicit drugs  C. Current medications and supplements D. Functional ability and status E.  Nutritional status F.  Physical activity G.  Advance directives H. List of other physicians I.  Hospitalizations, surgeries, and ER visits in previous 12 months J.  [place] such as hearing and vision if needed, cognitive and depression L. Referrals and appointments - none  In addition, I have reviewed and discussed with  patient certain preventive protocols, quality metrics, and best practice recommendations. A written personalized care plan for preventive services as well as general preventive health recommendations were provided to patient.  See attached scanned questionnaire for additional information.   Signed,  Fabio Neighbors, LPN Nurse Health Advisor   Nurse Recommendations: Pt needs a diabetic foot exam at next OV.

## 2018-08-05 IMAGING — MR MR HEAD W/O CM
9 of 10 series · 36 of 48 positions shown · non-contrast
Comparison: None.

CLINICAL DATA: Worsening nausea.  Vomiting.

EXAM:
MRI HEAD WITHOUT CONTRAST
TECHNIQUE: Multiplanar, multiecho pulse sequences of the brain and surrounding
structures were obtained without intravenous contrast.

[Series 3: T1 · sagittal · 5.0mm · 0.45mm/px · 2 of 29 slices shown (1 of 2)]
[im 1/29]
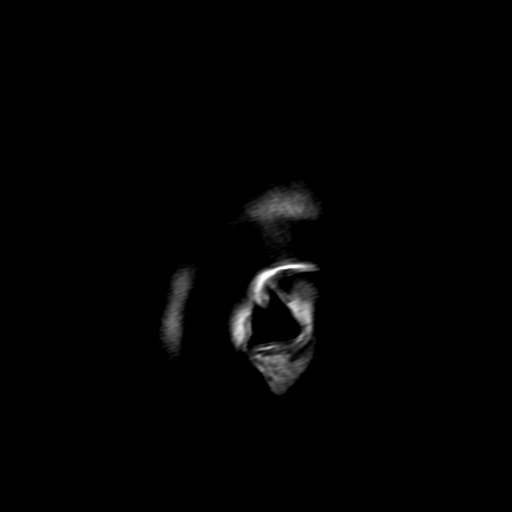
[im 29/29]
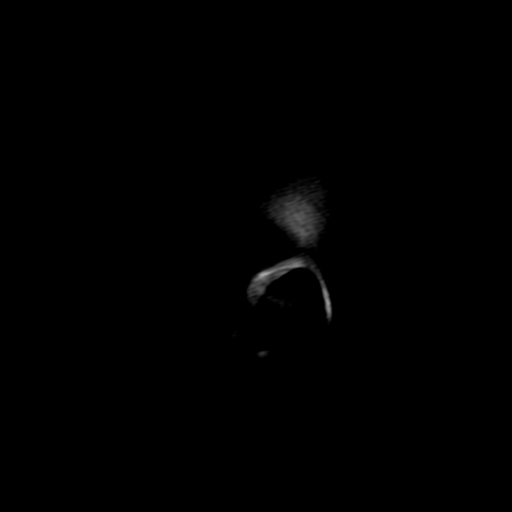

[Series 12: T2 · axial · 5.0mm · 0.45mm/px · z∈[-46,+120]mm · 3 of 27 slices shown (1 of 3)]
[im 1/27]
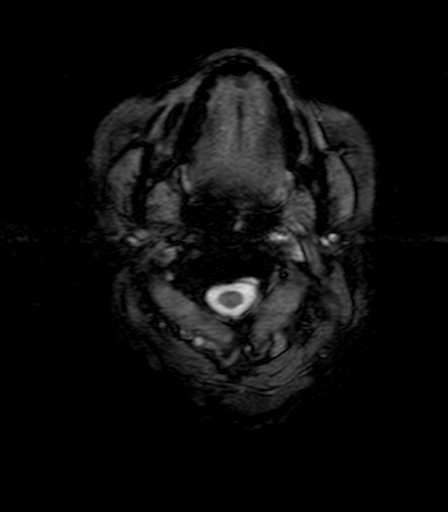
[im 14/27]
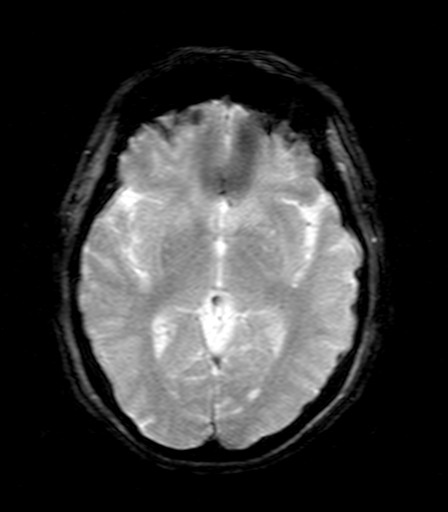
[im 27/27]
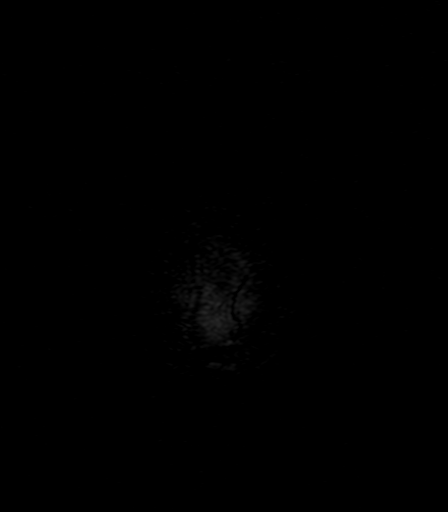

[Series 13: T2 · axial · 5.0mm · 0.45mm/px · z∈[-38,+113]mm · 2 of 23 slices shown (2 of 3)]
[im 1/23]
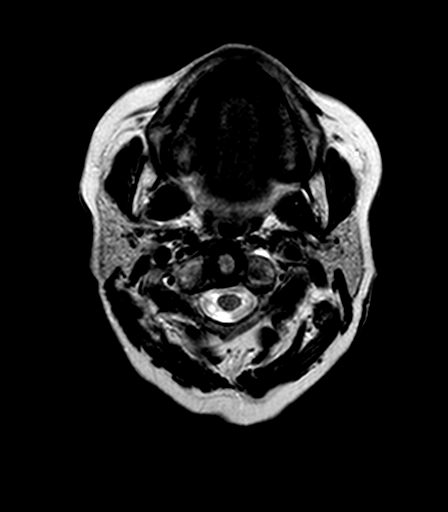
[im 23/23]
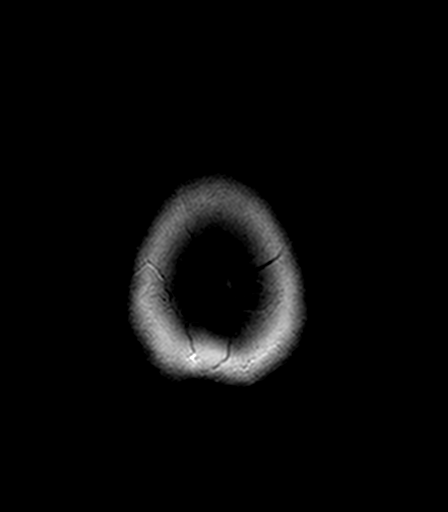

[Series 14: T2 · coronal · 5.0mm · 0.45mm/px · 3 of 29 slices shown (3 of 3)]
[im 1/29]
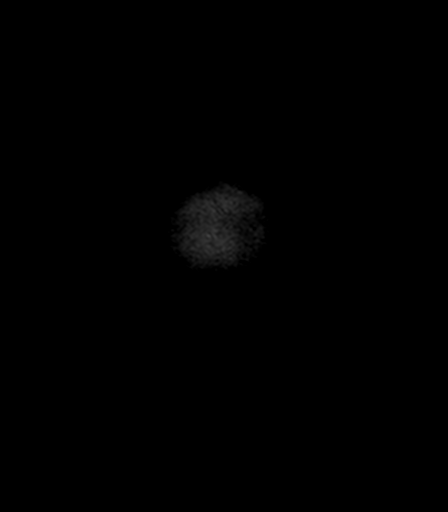
[im 15/29]
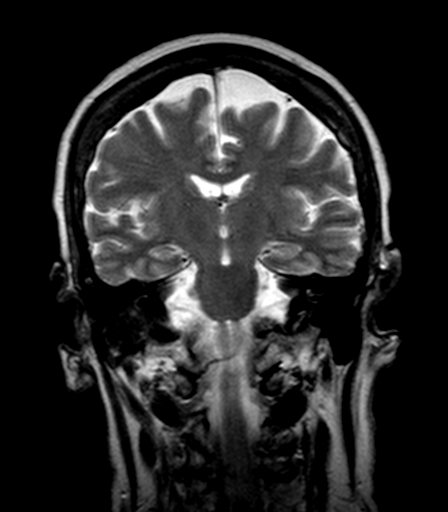
[im 29/29]
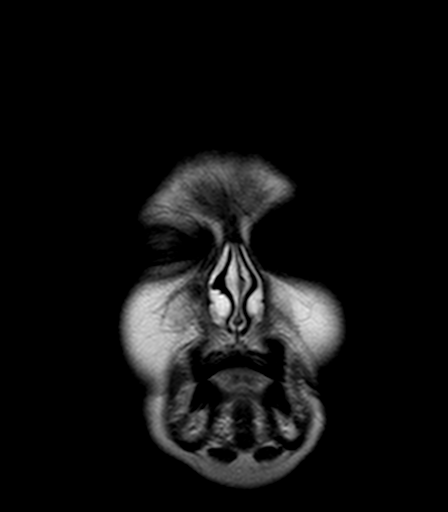

[Series 15: FLAIR · axial · 3.0mm · 0.90mm/px · z∈[-29,+115]mm · 5 of 50 slices shown]
[im 1/50]
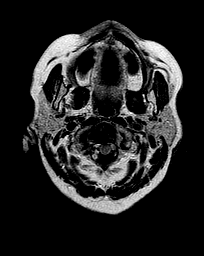
[im 13/50]
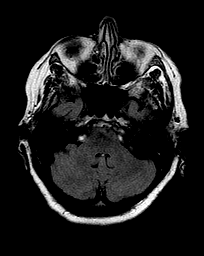
[im 25/50]
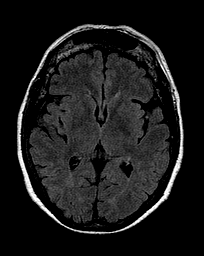
[im 37/50]
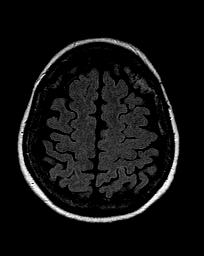
[im 50/50]
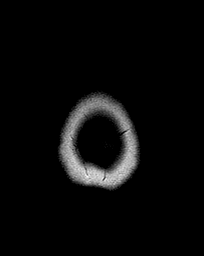

[Series 17: DWI · axial · 3.0mm · 0.94mm/px · z∈[-32,+114]mm · 5 of 51 slices shown (1 of 2)]
[im 1/51]
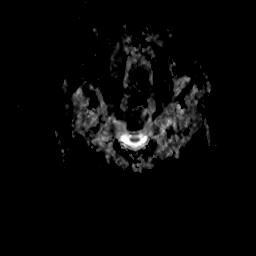
[im 13/51]
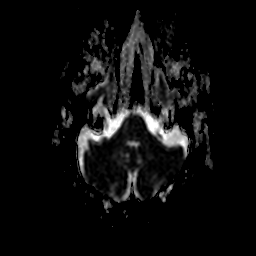
[im 26/51]
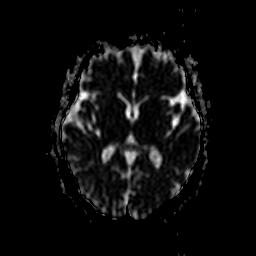
[im 38/51]
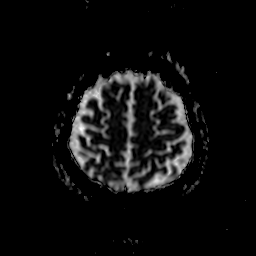
[im 51/51]
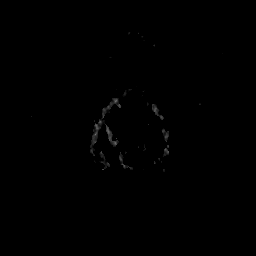

[Series 18: ax (id)-- · axial · 3.0mm · 0.94mm/px · z∈[-32,+76]mm · 4 of 51 slices shown]
[im 1/51]
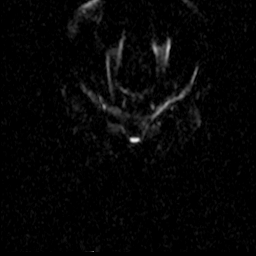
[im 13/51]
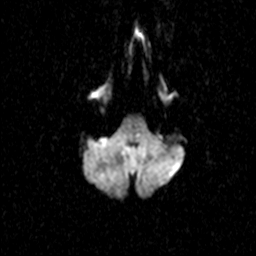
[im 26/51]
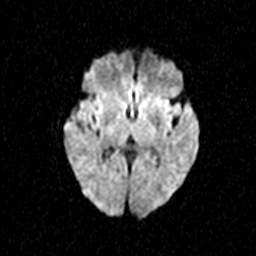
[im 38/51]
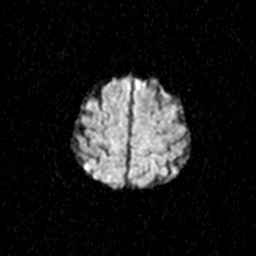

[Series 20: DWI · coronal · 5.0mm · 1.80mm/px · 4 of 37 slices shown (2 of 2)]
[im 1/37]
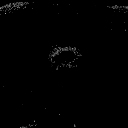
[im 13/37]
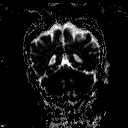
[im 25/37]
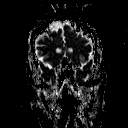
[im 37/37]
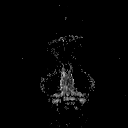

[Series 22: T1 · axial · 1.0mm · 0.45mm/px · z∈[-31,+115]mm · 8 of 160 slices shown (2 of 2)]
[im 11/160]
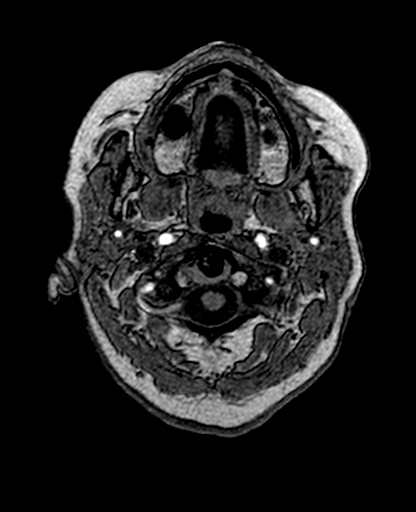
[im 32/160]
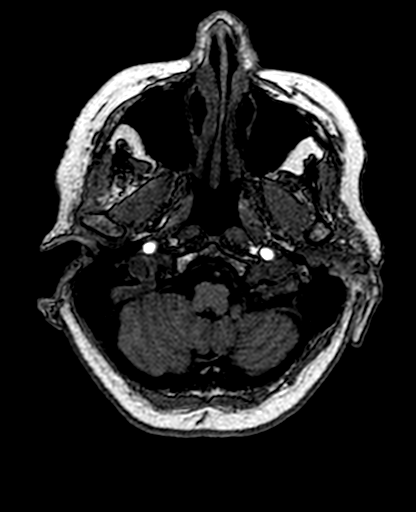
[im 54/160]
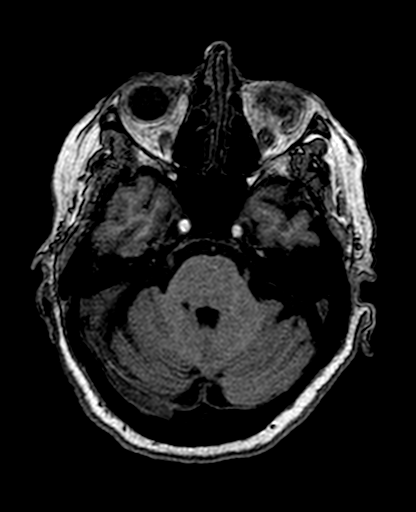
[im 75/160]
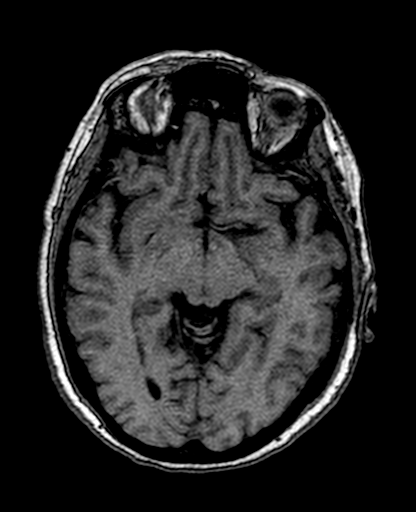
[im 96/160]
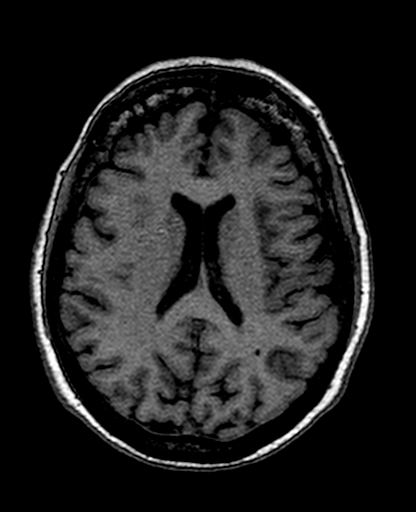
[im 117/160]
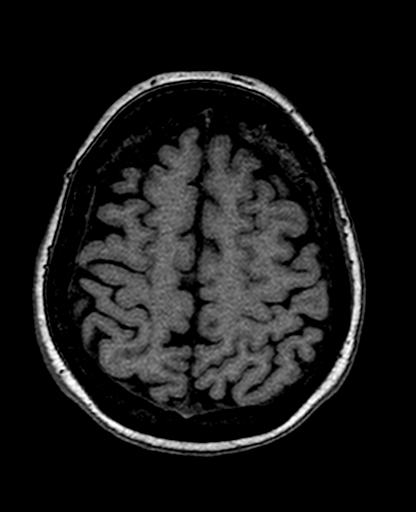
[im 138/160]
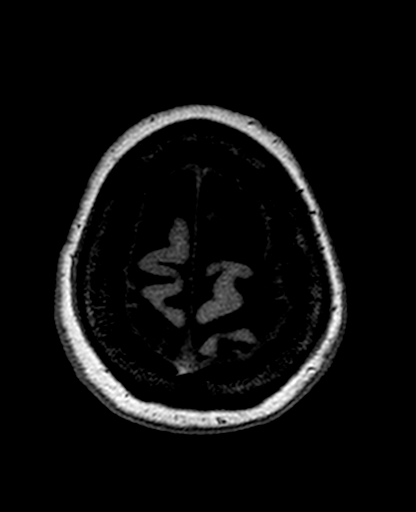
[im 160/160]
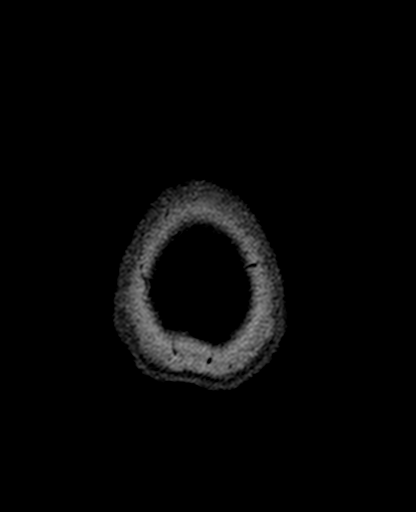

[36 of 48 positions shown; findings below may reference images not displayed]

FINDINGS: Brain: Diffusion imaging does not show any acute or subacute
infarction. The brainstem and cerebellum are normal. Cerebral
hemispheres are normal for age. The brain does not show accelerated
atrophy. There are no small or large vessel ischemic changes. No
mass lesion, hemorrhage, hydrocephalus or extra-axial collection.

Vascular: Major vessels at the base of the brain show flow.

Skull and upper cervical spine: Normal

Sinuses/Orbits: Clear/normal

Other: None significant
IMPRESSION: Normal study for a person of this age. No cause of nausea
identified.

## 2018-09-15 ENCOUNTER — Ambulatory Visit (INDEPENDENT_AMBULATORY_CARE_PROVIDER_SITE_OTHER): Payer: PPO | Admitting: Family Medicine

## 2018-09-15 VITALS — BP 140/60 | HR 73 | Temp 98.0°F | Resp 16 | Wt 165.0 lb

## 2018-09-15 DIAGNOSIS — I10 Essential (primary) hypertension: Secondary | ICD-10-CM

## 2018-09-15 DIAGNOSIS — E7800 Pure hypercholesterolemia, unspecified: Secondary | ICD-10-CM | POA: Diagnosis not present

## 2018-09-15 DIAGNOSIS — M1990 Unspecified osteoarthritis, unspecified site: Secondary | ICD-10-CM | POA: Diagnosis not present

## 2018-09-15 DIAGNOSIS — E119 Type 2 diabetes mellitus without complications: Secondary | ICD-10-CM | POA: Diagnosis not present

## 2018-09-15 LAB — POCT GLYCOSYLATED HEMOGLOBIN (HGB A1C): HEMOGLOBIN A1C: 7 % — AB (ref 4.0–5.6)

## 2018-09-15 NOTE — Patient Instructions (Signed)
Stop all Ranitidine/Zantac.  Take only Omeprazole

## 2018-09-15 NOTE — Progress Notes (Signed)
Jasmine Webster  MRN: [id number] DOB: 1926-08-11  Subjective:  HPI   The patient is a 84 year old female who presents for follow up of her diabetes.  She was last seen on 07/06/18 for her AWV by the Nurse Health Advisor and on 05/12/18 for her diabetes.  Her last A1C was 6.8 and was done on 05/16/18. The patient checks her glucose at home and gets readings that range from 100-140. Pt feels well. Still living independently. Patient Active Problem List   Diagnosis Date Noted  . Arthritis 03/23/2015  . Cardiovascular disease 03/23/2015  . Urinary system disease 03/23/2015  . Malignant neoplasm of corpus uteri ([place]) 03/23/2015  . Essential (primary) hypertension 03/23/2015  . Acid reflux 03/23/2015  . Cardiac murmur 03/23/2015  . Hypercholesteremia 03/23/2015  . Adult hypothyroidism 03/23/2015  . Cannot sleep 03/23/2015  . Malaise and fatigue 03/23/2015  . Muscle ache 03/23/2015  . Arthritis, degenerative 03/23/2015  . Type 2 diabetes mellitus without complications ([place]) 32/95/1884    Past Medical History:  Diagnosis Date  . Arthritis   . Cataract    cataract removal bilaterally approx 20 years ago  . Chronic back pain   . Diabetes mellitus without complication ([place])   . Hypertension     Social History   Socioeconomic History  . Marital status: Widowed    Spouse name: Not on file  . Number of children: 2  . Years of education: Not on file  . Highest education level: 8th grade  Occupational History  . Not on file  Social Needs  . Financial resource strain: Not hard at all  . Food insecurity:    Worry: Never true    Inability: Never true  . Transportation needs:    Medical: No    Non-medical: No  Tobacco Use  . Smoking status: Never Smoker  . Smokeless tobacco: Never Used  Substance and Sexual Activity  . Alcohol use: No    Alcohol/week: 0.0 standard drinks  . Drug use: No  . Sexual activity: Never  Lifestyle  . Physical activity:    Days per week: Not on  file    Minutes per session: Not on file  . Stress: Not at all  Relationships  . Social connections:    Talks on phone: Not on file    Gets together: Not on file    Attends religious service: Not on file    Active member of club or organization: Not on file    Attends meetings of clubs or organizations: Not on file    Relationship status: Not on file  . Intimate partner violence:    Fear of current or ex partner: Not on file    Emotionally abused: Not on file    Physically abused: Not on file    Forced sexual activity: Not on file  Other Topics Concern  . Not on file  Social History Narrative  . Not on file    Outpatient Encounter Medications as of 09/15/2018  Medication Sig  . ALPRAZolam (XANAX) 0.5 MG tablet TAKE 1 TABLET BY MOUTH EVERY DAY AT BEDTIME AS NEEDED  . amLODipine (NORVASC) 5 MG tablet TAKE 1 TABLET BY MOUTH EVERY DAY  . Artificial Tear Ointment (DRY EYES OP) Apply 1 drop to eye daily.  Marland Kitchen gabapentin (NEURONTIN) 100 MG capsule 200 mg 2 (two) times daily.   Marland Kitchen glucose blood ([employer] TEST STRIPS) test strip Check sugar twice daily.  Marland Kitchen glucose blood (ONE TOUCH ULTRA TEST) test  strip Check sugar twice daily.  . Insulin Glargine (LANTUS SOLOSTAR) 100 UNIT/ML Solostar Pen INJECT 28 UNITS SUBCUTANEOUSLY AT BEDTIME  . Insulin Pen Needle (B-D ULTRAFINE III SHORT PEN) 31G X 8 MM MISC CHECK SUGAR 2 TIMES DAILY, DX E11.9 (NEEDS ULTRA FINE PEN NEEDLES)  . Insulin Pen Needle (FIFTY50 PEN NEEDLES) 31G X 8 MM MISC Check sugar 2 times daily, DX E11.9 (needs ultra fine pen needles)  . Lancets (ONETOUCH ULTRASOFT) lancets Check sugar twice daily DX E11.9  . Multiple Vitamins-Minerals (PRESERVISION AREDS) CAPS Take 1 capsule by mouth 2 (two) times daily.   Marland Kitchen omega-3 acid ethyl esters (LOVAZA) 1 g capsule Take 1 g by mouth daily.   Marland Kitchen omeprazole (PRILOSEC) 20 MG capsule TAKE 1 CAPSULE BY MOUTH EVERY DAY  . predniSONE (STERAPRED UNI-PAK 21 TAB) 5 MG (21) TBPK tablet Taper as directed.  .  quinapril-hydrochlorothiazide (ACCURETIC) 20-12.5 MG tablet TAKE 1 TABLET BY MOUTH TWICE A DAY  . ranitidine (ZANTAC) 150 MG tablet TAKE 1 TABLET (150 MG TOTAL) BY MOUTH 2 (TWO) TIMES DAILY. (Patient taking differently: Take 150 mg by mouth at bedtime. )  . glucose blood (ONE TOUCH ULTRA TEST) test strip CHECK SUGAR TWICE A DAY (Patient not taking: Reported on 07/06/2018)  . [DISCONTINUED] HYDROcodone-acetaminophen (NORCO/VICODIN) 5-325 MG tablet Take 1 tablet by mouth 2 (two) times daily. (Patient not taking: Reported on 07/06/2018)   No facility-administered encounter medications on file as of 09/15/2018.     Allergies  Allergen Reactions  . Etodolac Nausea Only, Other (See Comments) and Anxiety    Dizziness, too  . Naproxen Other (See Comments) and Anxiety    Gave her an ulcer after taking it for years  . Statins Other (See Comments)    No energy and myalgias  . Latex Rash  . Terbinafine Other (See Comments) and Rash    "Maybe made me nervous"  . Terbinafine Hcl Rash    Review of Systems  Constitutional: Negative for fever and malaise/fatigue.  HENT: Negative.   Eyes: Negative.   Respiratory: Negative for cough, shortness of breath and wheezing.   Cardiovascular: Negative for chest pain, palpitations, orthopnea, claudication and leg swelling.  Gastrointestinal: Negative.   Musculoskeletal: Positive for joint pain and neck pain.  Skin: Negative.   Neurological: Negative.   Endo/Heme/Allergies: Negative.   Psychiatric/Behavioral: Negative.     Objective:  BP 140/60 (BP Location: Right Arm, Patient Position: Sitting, Cuff Size: Normal)   Pulse 73   Temp 98 F (36.7 C) (Oral)   Resp 16   Wt 165 lb (74.8 kg)   BMI 32.22 kg/m   Physical Exam  Constitutional: She is oriented to person, place, and time and well-developed, well-nourished, and in no distress.  HENT:  Head: Normocephalic and atraumatic.  Right Ear: External ear normal.  Left Ear: External ear normal.  Nose:  Nose normal.  Eyes: Conjunctivae are normal. No scleral icterus.  Neck: No thyromegaly present.  Cardiovascular: Normal rate, regular rhythm and normal heart sounds.  Pulmonary/Chest: Effort normal and breath sounds normal.  Abdominal: Soft.  Musculoskeletal: She exhibits no edema.  Neurological: She is alert and oriented to person, place, and time. Gait normal. GCS score is 15.  Skin: Skin is warm and dry.  Psychiatric: Mood, memory, affect and judgment normal.    Assessment and Plan :  Type 2 diabetes mellitus without complication, unspecified whether long term insulin use ([place]) - Plan: POCT glycosylated hemoglobin (Hb A1C) 1. Type 2 diabetes mellitus without complication,  unspecified whether long term insulin use ([facility]) Stable. - POCT glycosylated hemoglobin (Hb A1C)--7.0 today.  2. Essential (primary) hypertension Controlled.  3. Arthritis Stable.  4. Hypercholesteremia  I have done the exam and reviewed the chart and it is accurate to the best of my knowledge. Development worker, community has been used and  any errors in dictation or transcription are unintentional. Miguel Aschoff M.D. [facility]

## 2018-09-21 ENCOUNTER — Other Ambulatory Visit: Payer: Self-pay | Admitting: Family Medicine

## 2018-09-21 DIAGNOSIS — F419 Anxiety disorder, unspecified: Secondary | ICD-10-CM

## 2018-12-22 ENCOUNTER — Ambulatory Visit (INDEPENDENT_AMBULATORY_CARE_PROVIDER_SITE_OTHER): Payer: PPO | Admitting: Family Medicine

## 2018-12-22 ENCOUNTER — Encounter: Payer: Self-pay | Admitting: Family Medicine

## 2018-12-22 ENCOUNTER — Telehealth: Payer: Self-pay

## 2018-12-22 VITALS — BP 116/56 | HR 82 | Temp 98.3°F | Wt 163.2 lb

## 2018-12-22 DIAGNOSIS — E7800 Pure hypercholesterolemia, unspecified: Secondary | ICD-10-CM

## 2018-12-22 DIAGNOSIS — K219 Gastro-esophageal reflux disease without esophagitis: Secondary | ICD-10-CM

## 2018-12-22 DIAGNOSIS — E119 Type 2 diabetes mellitus without complications: Secondary | ICD-10-CM | POA: Diagnosis not present

## 2018-12-22 DIAGNOSIS — R079 Chest pain, unspecified: Secondary | ICD-10-CM

## 2018-12-22 DIAGNOSIS — M791 Myalgia, unspecified site: Secondary | ICD-10-CM

## 2018-12-22 DIAGNOSIS — I2510 Atherosclerotic heart disease of native coronary artery without angina pectoris: Secondary | ICD-10-CM | POA: Diagnosis not present

## 2018-12-22 DIAGNOSIS — I209 Angina pectoris, unspecified: Secondary | ICD-10-CM | POA: Diagnosis not present

## 2018-12-22 MED ORDER — ISOSORBIDE MONONITRATE ER 30 MG PO TB24: 30.0000 mg | ORAL_TABLET | Freq: Every day | ORAL | 3 refills | Status: DC

## 2018-12-22 MED ORDER — OMEPRAZOLE 20 MG PO CPDR: DELAYED_RELEASE_CAPSULE | ORAL | 3 refills | Status: DC

## 2018-12-22 MED ORDER — NITROGLYCERIN 0.4 MG SL SUBL: 0.4000 mg | SUBLINGUAL_TABLET | SUBLINGUAL | 3 refills | Status: DC | PRN

## 2018-12-22 NOTE — Progress Notes (Signed)
Patient: Jasmine Webster Female    DOB: [birth year]   85 y.o.   MRN: [id number] Visit Date: 12/22/2018  Today's Provider: [name], MD   Chief Complaint  Patient presents with  . Chest Pain   Subjective:     Chest Pain   This is a new problem. Episode onset: 1 week ago. The onset quality is sudden. Episode frequency: episodes. Patient states she has experienced two episodes the past week. The pain is present in the substernal region. The quality of the pain is described as dull. The pain does not radiate. She has tried rest for the symptoms. The treatment provided significant relief.  Her past medical history is significant for diabetes, hypertension and thyroid problem.  The pain in the chest pain she is having is only exertional.  No rest pain.  No associated symptoms.  The pain goes away when she rests.  Allergies  Allergen Reactions  . Etodolac Nausea Only, Other (See Comments) and Anxiety    Dizziness, too  . Naproxen Other (See Comments) and Anxiety    Gave her an ulcer after taking it for years  . Statins Other (See Comments)    No energy and myalgias  . Latex Rash  . Terbinafine Other (See Comments) and Rash    "Maybe made me nervous"  . Terbinafine Hcl Rash     Current Outpatient Medications:  .  ALPRAZolam (XANAX) 0.5 MG tablet, TAKE 1 TABLET BY MOUTH EVERY DAY AT BEDTIME AS NEEDED, Disp: 30 tablet, Rfl: 2 .  amLODipine (NORVASC) 5 MG tablet, TAKE 1 TABLET BY MOUTH EVERY DAY, Disp: 90 tablet, Rfl: 3 .  Artificial Tear Ointment (DRY EYES OP), Apply 1 drop to eye daily., Disp: , Rfl:  .  gabapentin (NEURONTIN) 100 MG capsule, 200 mg 2 (two) times daily. , Disp: , Rfl:  .  glucose blood (ONE TOUCH ULTRA TEST) test strip, Check sugar twice daily., Disp: 100 each, Rfl: 3 .  glucose blood (ONE TOUCH ULTRA TEST) test strip, CHECK SUGAR TWICE A DAY, Disp: 200 each, Rfl: 3 .  Insulin Glargine (LANTUS SOLOSTAR) 100 UNIT/ML Solostar Pen, INJECT 28 UNITS  SUBCUTANEOUSLY AT BEDTIME, Disp: 27 pen, Rfl: 3 .  Insulin Pen Needle (B-D ULTRAFINE III SHORT PEN) 31G X 8 MM MISC, CHECK SUGAR 2 TIMES DAILY, DX E11.9 (NEEDS ULTRA FINE PEN NEEDLES), Disp: 50 each, Rfl: 12 .  Insulin Pen Needle (FIFTY50 PEN NEEDLES) 31G X 8 MM MISC, Check sugar 2 times daily, DX E11.9 (needs ultra fine pen needles), Disp: , Rfl:  .  Lancets (ONETOUCH ULTRASOFT) lancets, Check sugar twice daily DX E11.9, Disp: 100 each, Rfl: 12 .  Multiple Vitamins-Minerals (PRESERVISION AREDS) CAPS, Take 1 capsule by mouth 2 (two) times daily. , Disp: , Rfl:  .  omega-3 acid ethyl esters (LOVAZA) 1 g capsule, Take 1 g by mouth daily. , Disp: , Rfl:  .  omeprazole (PRILOSEC) 20 MG capsule, TAKE 1 CAPSULE BY MOUTH EVERY DAY, Disp: , Rfl:  .  quinapril-hydrochlorothiazide (ACCURETIC) 20-12.5 MG tablet, TAKE 1 TABLET BY MOUTH TWICE A DAY, Disp: 180 tablet, Rfl: 3  Review of Systems  Constitutional: Negative.   Eyes: Negative.   Respiratory: Negative.   Cardiovascular: Positive for chest pain.  Gastrointestinal: Negative.   Endocrine: Negative.   Musculoskeletal: Negative.   Allergic/Immunologic: Negative.   Psychiatric/Behavioral: Negative.     Social History   Tobacco Use  . Smoking status: Never Smoker  .  Smokeless tobacco: Never Used  Substance Use Topics  . Alcohol use: No    Alcohol/week: 0.0 standard drinks      Objective:   BP (!) 116/56 (BP Location: Right Arm, Patient Position: Sitting, Cuff Size: Normal)   Pulse 82   Temp 98.3 F (36.8 C) (Oral)   Wt 163 lb 3.2 oz (74 kg)   SpO2 98%   BMI 31.87 kg/m  Vitals:   12/22/18 1458  BP: (!) 116/56  Pulse: 82  Temp: 98.3 F (36.8 C)  TempSrc: Oral  SpO2: 98%  Weight: 163 lb 3.2 oz (74 kg)     Physical Exam Constitutional:      Appearance: She is well-developed.  HENT:     Head: Normocephalic and atraumatic.     Right Ear: External ear normal.     Left Ear: External ear normal.     Nose: Nose normal.  Eyes:       Conjunctiva/sclera: Conjunctivae normal.     Pupils: Pupils are equal, round, and reactive to light.  Neck:     Musculoskeletal: Normal range of motion and neck supple.     Thyroid: No thyromegaly.  Cardiovascular:     Rate and Rhythm: Normal rate and regular rhythm.     Heart sounds: Normal heart sounds.  Pulmonary:     Effort: Pulmonary effort is normal.     Breath sounds: Normal breath sounds.  Abdominal:     Palpations: Abdomen is soft.  Musculoskeletal: Normal range of motion.  Skin:    General: Skin is warm and dry.  Neurological:     General: No focal deficit present.     Mental Status: She is alert and oriented to person, place, and time.     Deep Tendon Reflexes: Reflexes are normal and symmetric.  Psychiatric:        Mood and Affect: Mood normal.        Behavior: Behavior normal.        Thought Content: Thought content normal.        Judgment: Judgment normal.    ECG reveals left bundle branch block.     Assessment & Plan    1. Chest pain on exertion I believe this is angina.  Refer to cardiology.  Today start her 30 mg daily and have given the patient Nitrostat as needed. - EKG 12-Lead - Ambulatory referral to Cardiology - omeprazole (PRILOSEC) 20 MG capsule; TAKE 1 CAPSULE BY MOUTH EVERY DAY  Dispense: 30 capsule; Refill: 3 - isosorbide mononitrate (IMDUR) 30 MG 24 hr tablet; Take 1 tablet (30 mg total) by mouth daily.  Dispense: 30 tablet; Refill: 3 - nitroGLYCERIN (NITROSTAT) 0.4 MG SL tablet; Place 1 tablet (0.4 mg total) under the tongue every 5 (five) minutes as needed for chest pain.  Dispense: 25 tablet; Refill: 3  2. Angina pectoris ([place])  - Ambulatory referral to Cardiology - omeprazole (PRILOSEC) 20 MG capsule; TAKE 1 CAPSULE BY MOUTH EVERY DAY  Dispense: 30 capsule; Refill: 3 - isosorbide mononitrate (IMDUR) 30 MG 24 hr tablet; Take 1 tablet (30 mg total) by mouth daily.  Dispense: 30 tablet; Refill: 3 - nitroGLYCERIN (NITROSTAT) 0.4 MG SL  tablet; Place 1 tablet (0.4 mg total) under the tongue every 5 (five) minutes as needed for chest pain.  Dispense: 25 tablet; Refill: 3  3. Cardiovascular disease Saw Dr. Durwin Nora little from cardiology many years ago.  4. Type 2 diabetes mellitus without complication, unspecified whether long term insulin use ([place])  5. Muscle ache   6. Hypercholesteremia  7.GERD Start omeprazole. RTC 1 month.   I have done the exam and reviewed the above chart and it is accurate to the best of my knowledge. Development worker, community has been used in this note in any air is in the dictation or transcription are unintentional.  [name], MD  [place]

## 2018-12-22 NOTE — Telephone Encounter (Signed)
Pt called advising that she has been having chest pains when she is doing a lot of walking.  She went to [employer] the other day and walked from parking lot to the entrance door and she had to stop because she was having a lot of pan in her chest.  And again yesterday she had the same thing happen also with just doing a lot of walking.  She advised that she was not having any SOB or nubmness and tingling in her left arm.  When the chest pain occurs she advised that it happens for a couple of hours and she has to sit down to rest for it to ease off.  And it has only happened a couple of times.  I spoke to [place] and he advised that she should go to the ER to be evaluated.  I advised pt and she didn't want to go and would rather wait and see Rosanna Randy on 12/23/18.  I spoke to [place] then and he added her to his schedule for 12/22/18 at 3pm.  dbs

## 2018-12-23 ENCOUNTER — Ambulatory Visit: Payer: PPO | Admitting: Family Medicine

## 2018-12-23 ENCOUNTER — Telehealth: Payer: Self-pay

## 2018-12-23 NOTE — Telephone Encounter (Signed)
Judson Roch, Dr Rosanna Randy said the 11th is good.  Thanks [employer]

## 2018-12-23 NOTE — Telephone Encounter (Signed)
-----   Message from Jerrol Banana., MD sent at 12/23/2018 11:36 AM EST ----- 11th is better. ----- Message ----- From: Juluis Mire, CMA Sent: 12/23/2018   9:35 AM EST To: Jerrol Banana., MD  I was able to get patient in to see Dr Rockey Situ 12/29/18.Is this soon enough ? Dr Fletcher Anon does not have anything for a good while

## 2018-12-23 NOTE — Progress Notes (Deleted)
       Patient: Jasmine Webster Female    DOB: [birth year]   85 y.o.   MRN: [id number] Visit Date: 12/23/2018  Today's Provider: [name], MD   No chief complaint on file.  Subjective:     HPI  Allergies  Allergen Reactions  . Etodolac Nausea Only, Other (See Comments) and Anxiety    Dizziness, too  . Naproxen Other (See Comments) and Anxiety    Gave her an ulcer after taking it for years  . Statins Other (See Comments)    No energy and myalgias  . Latex Rash  . Terbinafine Other (See Comments) and Rash    "Maybe made me nervous"  . Terbinafine Hcl Rash     Current Outpatient Medications:  .  ALPRAZolam (XANAX) 0.5 MG tablet, TAKE 1 TABLET BY MOUTH EVERY DAY AT BEDTIME AS NEEDED, Disp: 30 tablet, Rfl: 2 .  amLODipine (NORVASC) 5 MG tablet, TAKE 1 TABLET BY MOUTH EVERY DAY, Disp: 90 tablet, Rfl: 3 .  Artificial Tear Ointment (DRY EYES OP), Apply 1 drop to eye daily., Disp: , Rfl:  .  gabapentin (NEURONTIN) 100 MG capsule, 200 mg 2 (two) times daily. , Disp: , Rfl:  .  glucose blood (ONE TOUCH ULTRA TEST) test strip, Check sugar twice daily., Disp: 100 each, Rfl: 3 .  glucose blood (ONE TOUCH ULTRA TEST) test strip, CHECK SUGAR TWICE A DAY, Disp: 200 each, Rfl: 3 .  Insulin Glargine (LANTUS SOLOSTAR) 100 UNIT/ML Solostar Pen, INJECT 28 UNITS SUBCUTANEOUSLY AT BEDTIME, Disp: 27 pen, Rfl: 3 .  Insulin Pen Needle (B-D ULTRAFINE III SHORT PEN) 31G X 8 MM MISC, CHECK SUGAR 2 TIMES DAILY, DX E11.9 (NEEDS ULTRA FINE PEN NEEDLES), Disp: 50 each, Rfl: 12 .  Insulin Pen Needle (FIFTY50 PEN NEEDLES) 31G X 8 MM MISC, Check sugar 2 times daily, DX E11.9 (needs ultra fine pen needles), Disp: , Rfl:  .  isosorbide mononitrate (IMDUR) 30 MG 24 hr tablet, Take 1 tablet (30 mg total) by mouth daily., Disp: 30 tablet, Rfl: 3 .  Lancets (ONETOUCH ULTRASOFT) lancets, Check sugar twice daily DX E11.9, Disp: 100 each, Rfl: 12 .  Multiple Vitamins-Minerals (PRESERVISION AREDS) CAPS, Take 1  capsule by mouth 2 (two) times daily. , Disp: , Rfl:  .  nitroGLYCERIN (NITROSTAT) 0.4 MG SL tablet, Place 1 tablet (0.4 mg total) under the tongue every 5 (five) minutes as needed for chest pain., Disp: 25 tablet, Rfl: 3 .  omega-3 acid ethyl esters (LOVAZA) 1 g capsule, Take 1 g by mouth daily. , Disp: , Rfl:  .  omeprazole (PRILOSEC) 20 MG capsule, TAKE 1 CAPSULE BY MOUTH EVERY DAY, Disp: 30 capsule, Rfl: 3 .  quinapril-hydrochlorothiazide (ACCURETIC) 20-12.5 MG tablet, TAKE 1 TABLET BY MOUTH TWICE A DAY, Disp: 180 tablet, Rfl: 3  Review of Systems  Social History   Tobacco Use  . Smoking status: Never Smoker  . Smokeless tobacco: Never Used  Substance Use Topics  . Alcohol use: No    Alcohol/week: 0.0 standard drinks      Objective:   There were no vitals taken for this visit. There were no vitals filed for this visit.   Physical Exam      Assessment & Plan        [name], MD  [facility]

## 2018-12-27 NOTE — Progress Notes (Signed)
Cardiology Office Note  Date:  12/29/2018   ID:  Jasmine Webster, DOB: 10-20-1926, MRN: [id number]  PCP:  Jasmine Webster., MD   Chief Complaint  Patient presents with  . OTHER    Chest pain with exertion. Meds reviewed verbally with pt.    HPI:  [name] is a 85 y.o. woman with past medical history of Diabetes type 2, hemoglobin A1c 6.8 GERD Hyperlipidemia Hypertension Cardiac murmur Arthritis Non smoker Referred by Dr. Rosanna Webster for symptoms of chest pain on exertion, angina, murmur  Widowed, no smoker  INTERVAL HISTORY: The patient reports today for her initial consultation. She is doing well overall. Of note she states she has had a heart murmur since she was a child. "Had rheumatic fever "   stays active by doing chores around the house, typically has no symptoms  Recently she was out shopping with her 57 year old grandson, walking fast in [place],  when she began feeling chest pain that lasted at least an hour. She followed up with Dr. Rosanna Webster, December 22, 2018. Was started on isosorbide 30. Referred to cardiology for further evaluation, concern for angina  She hurt her left ankle and reports mild swelling.  None on the right  Blood pressure 140/64 on today's visit  Lab work reviewed personally by myself Total Chol 273/ LDL 185 HBA1C 6.8 CR 1.03 Glucose 117  EKG personally reviewed by myself on todays visit Shows normal rhythm. 73 bpm. LBBB   OTHER PAST MEDICAL HISTORY REVIEWED BY ME FOR TODAY'S VISIT: Previously seen by Dr. Aldona Webster many years ago Prior office notes unavailable No recent echocardiogram available for review  PMH:   has a past medical history of Arthritis, Cataract, Chronic back pain, Coronary artery disease, Diabetes mellitus without complication ([place]), Heart murmur, and Hypertension.  PSH:    Past Surgical History:  Procedure Laterality Date  . ABDOMINAL HYSTERECTOMY    . APPENDECTOMY    . BACK SURGERY     three  .  CARDIAC CATHETERIZATION     6+yrs no stents Dr. Verneda Skill  . CATARACT EXTRACTION    . THORACOLUMBAR SYRINGO SHUNT    . TONSILLECTOMY AND ADENOIDECTOMY      Current Outpatient Medications  Medication Sig Dispense Refill  . ALPRAZolam (XANAX) 0.5 MG tablet TAKE 1 TABLET BY MOUTH EVERY DAY AT BEDTIME AS NEEDED 30 tablet 2  . amLODipine (NORVASC) 5 MG tablet TAKE 1 TABLET BY MOUTH EVERY DAY 90 tablet 3  . Artificial Tear Ointment (DRY EYES OP) Apply 1 drop to eye daily.    Marland Kitchen aspirin EC 81 MG tablet Take 81 mg by mouth daily.    Marland Kitchen gabapentin (NEURONTIN) 100 MG capsule 200 mg 2 (two) times daily.     Marland Kitchen glucose blood (ONE TOUCH ULTRA TEST) test strip Check sugar twice daily. 100 each 3  . glucose blood (ONE TOUCH ULTRA TEST) test strip CHECK SUGAR TWICE A DAY 200 each 3  . Insulin Glargine (LANTUS SOLOSTAR) 100 UNIT/ML Solostar Pen INJECT 28 UNITS SUBCUTANEOUSLY AT BEDTIME 27 pen 3  . Insulin Pen Needle (B-D ULTRAFINE III SHORT PEN) 31G X 8 MM MISC CHECK SUGAR 2 TIMES DAILY, DX E11.9 (NEEDS ULTRA FINE PEN NEEDLES) 50 each 12  . Insulin Pen Needle (FIFTY50 PEN NEEDLES) 31G X 8 MM MISC Check sugar 2 times daily, DX E11.9 (needs ultra fine pen needles)    . isosorbide mononitrate (IMDUR) 30 MG 24 hr tablet Take 1 tablet (30 mg total)  by mouth daily. 30 tablet 3  . Lancets (ONETOUCH ULTRASOFT) lancets Check sugar twice daily DX E11.9 100 each 12  . Multiple Vitamins-Minerals (PRESERVISION AREDS) CAPS Take 1 capsule by mouth 2 (two) times daily.     . nitroGLYCERIN (NITROSTAT) 0.4 MG SL tablet Place 1 tablet (0.4 mg total) under the tongue every 5 (five) minutes as needed for chest pain. 25 tablet 3  . omega-3 acid ethyl esters (LOVAZA) 1 g capsule Take 1 g by mouth daily.     . Omega-3 Fatty Acids (FISH OIL PO) Take by mouth daily.    Marland Kitchen omeprazole (PRILOSEC) 20 MG capsule TAKE 1 CAPSULE BY MOUTH EVERY DAY 30 capsule 3  . quinapril-hydrochlorothiazide (ACCURETIC) 20-12.5 MG tablet TAKE 1 TABLET  BY MOUTH TWICE A DAY 180 tablet 3   No current facility-administered medications for this visit.      ALLERGIES:   Etodolac; Naproxen; Statins; Latex; Terbinafine; and Terbinafine hcl   SOCIAL HISTORY:  The patient  reports that she has never smoked. She has never used smokeless tobacco. She reports that she does not drink alcohol or use drugs.   FAMILY HISTORY:   family history includes Cancer in her brother and brother; Diabetes in her father; Heart attack in her mother; Heart disease in her mother and sister; Pneumonia in her father; Stroke in her brother, father, and sister; Sudden death in her brother, sister, and sister.    REVIEW OF SYSTEMS: Review of Systems  Constitutional: Negative.   Eyes: Negative.   Respiratory: Negative.  Negative for shortness of breath.   Cardiovascular: Positive for chest pain and leg swelling (left ankle).  Gastrointestinal: Negative.   Genitourinary: Negative.   Musculoskeletal: Negative.   Neurological: Negative.   Psychiatric/Behavioral: Negative.   All other systems reviewed and are negative.    PHYSICAL EXAM: VS:  BP 140/64 (BP Location: Right Arm, Patient Position: Sitting, Cuff Size: Normal)   Pulse 73   Ht 5' 4.5" (1.638 m)   Wt 162 lb 12 oz (73.8 kg)   BMI 27.50 kg/m  , BMI Body mass index is 27.5 kg/m.  GEN: Well nourished, well developed, in no acute distress HEENT: normal Neck: no JVD, there is carotid bruits radiating upwards bilaterally, likely secondary to cardiac etiology,  no masses Cardiac: RRR; positive 3/6 murmur present right sternal border radiating to the left ,trace non pittng edema left lower extremity.   Respiratory:  clear to auscultation bilaterally, normal work of breathing GI: soft, nontender, nondistended, + BS MS: no deformity or atrophy Skin: warm and dry, no rash Neuro:  Strength and sensation are intact Psych: euthymic mood, full affect  RECENT LABS: 05/13/2018: ALT 15; BUN 23; Creatinine, Ser 1.03;  Hemoglobin 11.8; Platelets CANCELED; Potassium 4.2; Sodium 136; TSH 2.990    LIPID PANEL: Numbers as detailed above    WEIGHT: Wt Readings from Last 3 Encounters:  12/29/18 162 lb 12 oz (73.8 kg)  12/22/18 163 lb 3.2 oz (74 kg)  09/15/18 165 lb (74.8 kg)       ASSESSMENT AND PLAN:  Controlled type 2 diabetes mellitus with other circulatory complication, without long-term current use of insulin ([place]) Plan: Managed by Dr. Rosanna Webster, recommended low carbohydrate diet Hemoglobin A1c 6.8  Chest pain with moderate risk for cardiac etiology Plan: EKG 12-Lead Significant concern for chest pain from worsening valvular heart disease We will order an echocardiogram for suspected aortic valve disease,  Prominent murmur on exam Most likely valve disorder but unable to exclude  ischemia  Will start with echocardiogram, if valve disease not impressive would proceed with stress testing.  If valve disease is impressive and needs intervention would then likely need to proceed with cardiac catheterization for preoperative clearance before fixing valve  Benign essential HTN Plan: Continue monitoring.   Blood pressure high end of normal today, she will monitor at home No medication changes made today  Mixed hyperlipidemia Plan: Total chol 273/ LDL 185 She would likely benefit from a statin We will discuss with her in follow-up  Cardiac Murmur Plan: concern for Aortic valve stenosis possible severe, echo pending.  She reports having rheumatic fever as a child.  Disposition:   F/U  1 months We will follow-up after her echocardiogram  Long discussion with her concerning types of valvular heart disease including aortic valve stenosis, mechanisms of repair, and work-up required before valve procedure  Total encounter time more than 45 minutes. Greater than 50% was spent in counseling and coordination of care with the patient.    Orders Placed This Encounter  Procedures  . EKG 12-Lead  .  ECHOCARDIOGRAM COMPLETE    I, Margit Banda am acting as a scribe for Ida Rogue, M.D., Ph.D.  I have reviewed the above documentation for accuracy and completeness, and I agree with the above.   Signed, Esmond Plants, M.D., Ph.D. 12/29/2018  [place], [place]

## 2018-12-28 DIAGNOSIS — E782 Mixed hyperlipidemia: Secondary | ICD-10-CM | POA: Insufficient documentation

## 2018-12-28 DIAGNOSIS — I209 Angina pectoris, unspecified: Secondary | ICD-10-CM | POA: Insufficient documentation

## 2018-12-29 ENCOUNTER — Encounter: Payer: Self-pay | Admitting: Cardiovascular Disease

## 2018-12-29 ENCOUNTER — Ambulatory Visit: Payer: PPO | Admitting: Cardiovascular Disease

## 2018-12-29 VITALS — BP 140/64 | HR 73 | Ht 64.5 in | Wt 162.8 lb

## 2018-12-29 DIAGNOSIS — I10 Essential (primary) hypertension: Secondary | ICD-10-CM

## 2018-12-29 DIAGNOSIS — R079 Chest pain, unspecified: Secondary | ICD-10-CM | POA: Diagnosis not present

## 2018-12-29 DIAGNOSIS — E782 Mixed hyperlipidemia: Secondary | ICD-10-CM

## 2018-12-29 DIAGNOSIS — E1159 Type 2 diabetes mellitus with other circulatory complications: Secondary | ICD-10-CM

## 2018-12-29 DIAGNOSIS — I359 Nonrheumatic aortic valve disorder, unspecified: Secondary | ICD-10-CM

## 2018-12-29 NOTE — Patient Instructions (Addendum)
Medication Instructions:  No changes  If you need a refill on your cardiac medications before your next appointment, please call your pharmacy.    Lab work: No new labs needed   If you have labs (blood work) drawn today and your tests are completely normal, you will receive your results only by: Marland Kitchen MyChart Message (if you have MyChart) OR . A paper copy in the mail If you have any lab test that is abnormal or we need to change your treatment, we will call you to review the results.   Testing/Procedures: We will order an echocardiogram for aortic valve disease, murmur Your physician has requested that you have an echocardiogram. Echocardiography is a painless test that uses sound waves to create images of your heart. It provides your doctor with information about the size and shape of your heart and how well your heart's chambers and valves are working. This procedure takes approximately one hour. There are no restrictions for this procedure.    Follow-Up: At [facility], you and your health needs are our priority.  As part of our continuing mission to provide you with exceptional heart care, we have created designated Provider Care Teams.  These Care Teams include your primary Cardiologist (physician) and Advanced Practice Providers (APPs -  Physician Assistants and Nurse Practitioners) who all work together to provide you with the care you need, when you need it.  . You will need a follow up appointment in 1 month   . Providers on your designated Care Team:   . Jasmine Hodgkins, NP . Jasmine Faith, PA-C . Jasmine Mood, PA-C  Any Other Special Instructions Will Be Listed Below (If Applicable).  For educational health videos Log in to : [email or web address] Or : [email or web address], password : [email or web address]   Echocardiogram An echocardiogram is a procedure that uses painless sound waves (ultrasound) to produce an image of the heart. Images from an echocardiogram can provide important  information about:  Signs of coronary artery disease (CAD).  Aneurysm detection. An aneurysm is a weak or damaged part of an artery wall that bulges out from the normal force of blood pumping through the body.  Heart size and shape. Changes in the size or shape of the heart can be associated with certain conditions, including heart failure, aneurysm, and CAD.  Heart muscle function.  Heart valve function.  Signs of a past heart attack.  Fluid buildup around the heart.  Thickening of the heart muscle.  A tumor or infectious growth around the heart valves. Tell a health care provider about:  Any allergies you have.  All medicines you are taking, including vitamins, herbs, eye drops, creams, and over-the-counter medicines.  Any blood disorders you have.  Any surgeries you have had.  Any medical conditions you have.  Whether you are pregnant or may be pregnant. What are the risks? Generally, this is a safe procedure. However, problems may occur, including:  Allergic reaction to dye (contrast) that may be used during the procedure. What happens before the procedure? No specific preparation is needed. You may eat and drink normally. What happens during the procedure?   An IV tube may be inserted into one of your veins.  You may receive contrast through this tube. A contrast is an injection that improves the quality of the pictures from your heart.  A gel will be applied to your chest.  A wand-like tool (transducer) will be moved over your chest. The gel will help to transmit the sound waves from  the transducer.  The sound waves will harmlessly bounce off of your heart to allow the heart images to be captured in real-time motion. The images will be recorded on a computer. The procedure may vary among health care providers and hospitals. What happens after the procedure?  You may return to your normal, everyday life, including diet, activities, and medicines, unless your  health care provider tells you not to do that. Summary  An echocardiogram is a procedure that uses painless sound waves (ultrasound) to produce an image of the heart.  Images from an echocardiogram can provide important information about the size and shape of your heart, heart muscle function, heart valve function, and fluid buildup around your heart.  You do not need to do anything to prepare before this procedure. You may eat and drink normally.  After the echocardiogram is completed, you may return to your normal, everyday life, unless your health care provider tells you not to do that. This information is not intended to replace advice given to you by your health care provider. Make sure you discuss any questions you have with your health care provider. Document Released: 11/01/2000 Document Revised: 12/07/2016 Document Reviewed: 12/07/2016 Elsevier Interactive Patient Education  2019 [employer].

## 2019-01-12 ENCOUNTER — Ambulatory Visit (INDEPENDENT_AMBULATORY_CARE_PROVIDER_SITE_OTHER): Payer: PPO | Admitting: Family Medicine

## 2019-01-12 VITALS — BP 118/62 | HR 70 | Temp 97.8°F | Resp 16 | Wt 163.0 lb

## 2019-01-12 DIAGNOSIS — E1159 Type 2 diabetes mellitus with other circulatory complications: Secondary | ICD-10-CM

## 2019-01-12 DIAGNOSIS — E785 Hyperlipidemia, unspecified: Secondary | ICD-10-CM

## 2019-01-12 DIAGNOSIS — I209 Angina pectoris, unspecified: Secondary | ICD-10-CM

## 2019-01-12 DIAGNOSIS — E1121 Type 2 diabetes mellitus with diabetic nephropathy: Secondary | ICD-10-CM | POA: Diagnosis not present

## 2019-01-12 DIAGNOSIS — M25552 Pain in left hip: Secondary | ICD-10-CM

## 2019-01-12 DIAGNOSIS — M5417 Radiculopathy, lumbosacral region: Secondary | ICD-10-CM | POA: Diagnosis not present

## 2019-01-12 DIAGNOSIS — E1169 Type 2 diabetes mellitus with other specified complication: Secondary | ICD-10-CM

## 2019-01-12 DIAGNOSIS — I359 Nonrheumatic aortic valve disorder, unspecified: Secondary | ICD-10-CM | POA: Diagnosis not present

## 2019-01-12 MED ORDER — ROSUVASTATIN CALCIUM 5 MG PO TABS: 5.0000 mg | ORAL_TABLET | Freq: Every day | ORAL | 11 refills | Status: DC

## 2019-01-12 NOTE — Progress Notes (Signed)
COTI BURD  MRN: [id number] DOB: Jan 03, 1926  Subjective:  HPI   The patient is a 85 year old female who presents for follow up of chronic health.  She was last seen on 12/22/2018 for chest pain that she was ultimately referred to cardiology for evaluation.  She was seen by Dr Rockey Situ and is in the process of a cardiac work up.    Diabetes-Patient was last seen for this problem on 09/15/18.  He last A1C was on that day and is was 7.0 which was up slightly from her previous one at 6.8.  The patient reports that her readings at home have been ranging 100-170.  The patient is also complaining of left hip and leg pain.  She states she has been seen for this previously.  She thought she was going to get a referral to Dr Mack Guise but never did hear anything.  She states that the pain radiates down her leg and the leg pain is actually worse than the hip.  Patient Active Problem List   Diagnosis Date Noted  . Chest pain with moderate risk for cardiac etiology 12/28/2018  . Mixed hyperlipidemia 12/28/2018  . Arthritis 03/23/2015  . Cardiovascular disease 03/23/2015  . Urinary system disease 03/23/2015  . Malignant neoplasm of corpus uteri ([place]) 03/23/2015  . Benign essential HTN 03/23/2015  . Acid reflux 03/23/2015  . Cardiac murmur 03/23/2015  . Hypercholesteremia 03/23/2015  . Adult hypothyroidism 03/23/2015  . Cannot sleep 03/23/2015  . Malaise and fatigue 03/23/2015  . Muscle ache 03/23/2015  . Arthritis, degenerative 03/23/2015  . Diabetes mellitus type II, controlled ([place]) 03/23/2015    Past Medical History:  Diagnosis Date  . Arthritis   . Cataract    cataract removal bilaterally approx 20 years ago  . Chronic back pain   . Coronary artery disease   . Diabetes mellitus without complication ([place])   . Heart murmur   . Hypertension     Social History   Socioeconomic History  . Marital status: Widowed    Spouse name: Not on file  . Number of children: 2  . Years of  education: Not on file  . Highest education level: 8th grade  Occupational History  . Not on file  Social Needs  . Financial resource strain: Not hard at all  . Food insecurity:    Worry: Never true    Inability: Never true  . Transportation needs:    Medical: No    Non-medical: No  Tobacco Use  . Smoking status: Never Smoker  . Smokeless tobacco: Never Used  Substance and Sexual Activity  . Alcohol use: No    Alcohol/week: 0.0 standard drinks  . Drug use: No  . Sexual activity: Never  Lifestyle  . Physical activity:    Days per week: Not on file    Minutes per session: Not on file  . Stress: Not at all  Relationships  . Social connections:    Talks on phone: Not on file    Gets together: Not on file    Attends religious service: Not on file    Active member of club or organization: Not on file    Attends meetings of clubs or organizations: Not on file    Relationship status: Not on file  . Intimate partner violence:    Fear of current or ex partner: Not on file    Emotionally abused: Not on file    Physically abused: Not on file  Forced sexual activity: Not on file  Other Topics Concern  . Not on file  Social History Narrative  . Not on file    Outpatient Encounter Medications as of 01/12/2019  Medication Sig  . ALPRAZolam (XANAX) 0.5 MG tablet TAKE 1 TABLET BY MOUTH EVERY DAY AT BEDTIME AS NEEDED  . amLODipine (NORVASC) 5 MG tablet TAKE 1 TABLET BY MOUTH EVERY DAY  . Artificial Tear Ointment (DRY EYES OP) Apply 1 drop to eye daily.  Marland Kitchen aspirin EC 81 MG tablet Take 81 mg by mouth daily.  Marland Kitchen glucose blood (ONE TOUCH ULTRA TEST) test strip Check sugar twice daily.  Marland Kitchen glucose blood (ONE TOUCH ULTRA TEST) test strip CHECK SUGAR TWICE A DAY  . HYDROcodone-acetaminophen (NORCO/VICODIN) 5-325 MG tablet Take 1 tablet by mouth every 6 (six) hours as needed for moderate pain.  . Insulin Glargine (LANTUS SOLOSTAR) 100 UNIT/ML Solostar Pen INJECT 28 UNITS SUBCUTANEOUSLY AT  BEDTIME  . Insulin Pen Needle (B-D ULTRAFINE III SHORT PEN) 31G X 8 MM MISC CHECK SUGAR 2 TIMES DAILY, DX E11.9 (NEEDS ULTRA FINE PEN NEEDLES)  . Insulin Pen Needle (FIFTY50 PEN NEEDLES) 31G X 8 MM MISC Check sugar 2 times daily, DX E11.9 (needs ultra fine pen needles)  . isosorbide mononitrate (IMDUR) 30 MG 24 hr tablet Take 1 tablet (30 mg total) by mouth daily.  . Lancets (ONETOUCH ULTRASOFT) lancets Check sugar twice daily DX E11.9  . Multiple Vitamins-Minerals (PRESERVISION AREDS) CAPS Take 1 capsule by mouth 2 (two) times daily.   . nitroGLYCERIN (NITROSTAT) 0.4 MG SL tablet Place 1 tablet (0.4 mg total) under the tongue every 5 (five) minutes as needed for chest pain.  . Omega-3 Fatty Acids (FISH OIL PO) Take by mouth daily.  Marland Kitchen omeprazole (PRILOSEC) 20 MG capsule TAKE 1 CAPSULE BY MOUTH EVERY DAY  . quinapril-hydrochlorothiazide (ACCURETIC) 20-12.5 MG tablet TAKE 1 TABLET BY MOUTH TWICE A DAY  . gabapentin (NEURONTIN) 100 MG capsule 200 mg 2 (two) times daily.   . [DISCONTINUED] omega-3 acid ethyl esters (LOVAZA) 1 g capsule Take 1 g by mouth daily.    No facility-administered encounter medications on file as of 01/12/2019.     Allergies  Allergen Reactions  . Etodolac Nausea Only, Other (See Comments) and Anxiety    Dizziness, too  . Naproxen Other (See Comments) and Anxiety    Gave her an ulcer after taking it for years  . Statins Other (See Comments)    No energy and myalgias  . Latex Rash  . Terbinafine Other (See Comments) and Rash    "Maybe made me nervous"  . Terbinafine Hcl Rash    Review of Systems  Constitutional: Negative.   HENT: Negative.   Eyes: Negative.   Respiratory: Positive for cough (possibly from drainage. She uses Benadryl for allergies and on days that she takes the [place] she does not have the coughing as bad.).   Cardiovascular: Negative for chest pain, palpitations, orthopnea, claudication and leg swelling.  Gastrointestinal: Negative.     Musculoskeletal: Positive for back pain, joint pain and myalgias. Negative for falls and neck pain.  Skin: Negative.   Neurological: Negative.   Endo/Heme/Allergies: Negative.   Psychiatric/Behavioral: Negative.     Objective:  BP 118/62 (BP Location: Right Arm, Patient Position: Sitting, Cuff Size: Normal)   Pulse 70   Temp 97.8 F (36.6 C) (Oral)   Resp 16   Wt 163 lb (73.9 kg)   BMI 27.55 kg/m   Physical Exam  Constitutional: She is oriented to person, place, and time and well-developed, well-nourished, and in no distress.  HENT:  Head: Normocephalic and atraumatic.  Right Ear: External ear normal.  Left Ear: External ear normal.  Nose: Nose normal.  Eyes: Conjunctivae are normal. No scleral icterus.  Neck: No thyromegaly present.  Cardiovascular: Normal rate, regular rhythm and normal heart sounds.  Pulmonary/Chest: Effort normal and breath sounds normal.  Abdominal: Soft.  Musculoskeletal:        General: No edema.     Comments: She has some tenderness over the left  greater trochanter.  Neurological: She is alert and oriented to person, place, and time. GCS score is 15.  Grossly nonfocal exam of the lower extremities.  Skin: Skin is warm and dry.  Psychiatric: Mood, memory, affect and judgment normal.    Assessment and Plan :  1. Left hip pain Hopefully orthopedics can help pain as it could be a trochanteric bursitis.  Otherwise, if all of the pain is a is a radiculopathy it will be much more complicated for patient.  Patient has referral to pain clinic, [facility]  - AMB referral to orthopedics  2. Angina pectoris ([place]) On long-acting nitroglycerin and stat available.  Work-up per cardiology.  3. L-S radiculopathy Per Dr. Andree Elk  4. Controlled type 2 diabetes mellitus with other circulatory complication, unspecified whether long term insulin use ([place]) Follow routine lab work for nephropathy and A1c  5. Aortic valve disease Evaluation underway by  cardiology.  6. Type 2 diabetes mellitus with diabetic nephropathy, without long-term current use of insulin ([place]) 7.HLD After lengthy discussion with the patient we will try Crestor 5 mg Monday Wednesday Friday.  Lipids on next office visit. I have done the exam and reviewed the chart and it is accurate to the best of my knowledge. Development worker, community has been used and  any errors in dictation or transcription are unintentional. Miguel Aschoff M.D. [facility]

## 2019-01-14 DIAGNOSIS — M7062 Trochanteric bursitis, left hip: Secondary | ICD-10-CM | POA: Diagnosis not present

## 2019-01-14 DIAGNOSIS — M1712 Unilateral primary osteoarthritis, left knee: Secondary | ICD-10-CM | POA: Diagnosis not present

## 2019-01-15 DIAGNOSIS — M5417 Radiculopathy, lumbosacral region: Secondary | ICD-10-CM | POA: Insufficient documentation

## 2019-01-27 ENCOUNTER — Other Ambulatory Visit: Payer: Self-pay

## 2019-01-27 ENCOUNTER — Ambulatory Visit (INDEPENDENT_AMBULATORY_CARE_PROVIDER_SITE_OTHER): Payer: PPO

## 2019-01-27 DIAGNOSIS — I359 Nonrheumatic aortic valve disorder, unspecified: Secondary | ICD-10-CM

## 2019-01-29 ENCOUNTER — Other Ambulatory Visit: Payer: Self-pay | Admitting: Family Medicine

## 2019-01-29 DIAGNOSIS — F419 Anxiety disorder, unspecified: Secondary | ICD-10-CM

## 2019-02-02 ENCOUNTER — Ambulatory Visit: Payer: PPO | Admitting: Nurse Practitioner

## 2019-02-03 ENCOUNTER — Ambulatory Visit: Payer: PPO | Admitting: Nurse Practitioner

## 2019-02-04 ENCOUNTER — Telehealth: Payer: Self-pay | Admitting: Cardiovascular Disease

## 2019-02-04 NOTE — Telephone Encounter (Signed)
Patient calling to discuss recent testing results  ° °Please call  ° °

## 2019-02-04 NOTE — Telephone Encounter (Signed)
Dr Rockey Situ has not resulted echo at this time. Preliminary results given to patient and daughter on the phone at the same time. Verbalized understanding that we will call with final review from Dr Rockey Situ. They were appreciative.

## 2019-02-11 ENCOUNTER — Ambulatory Visit: Payer: Self-pay | Admitting: Family Medicine

## 2019-02-22 ENCOUNTER — Other Ambulatory Visit: Payer: Self-pay | Admitting: Family Medicine

## 2019-02-22 ENCOUNTER — Telehealth: Payer: Self-pay | Admitting: Nurse Practitioner

## 2019-02-22 ENCOUNTER — Telehealth: Payer: Self-pay

## 2019-02-22 NOTE — Telephone Encounter (Signed)
Scheduled 4/22 for video evisit

## 2019-02-22 NOTE — Telephone Encounter (Signed)
Call attempted to reschedule 03/10/2019 face to face office visit with Ignacia Bayley, NP to a telephone or video call due to current clinic policies related to COVID 19 precautions. Left voicemail message requesting to have patient call back to discuss.

## 2019-02-22 NOTE — Telephone Encounter (Signed)
Virtual Visit Pre-Appointment Phone Call  Steps For Call:  1. Confirm consent - "In the setting of the current Covid19 crisis, you are scheduled for a (phone or video) visit with your provider on (date) at (time).  Just as we do with many in-office visits, in order for you to participate in this visit, we must obtain consent.  If you'd like, I can send this to your mychart (if signed up) or email for you to review.  Otherwise, I can obtain your verbal consent now.  All virtual visits are billed to your insurance company just like a normal visit would be.  By agreeing to a virtual visit, we'd like you to understand that the technology does not allow for your provider to perform an examination, and thus may limit your provider's ability to fully assess your condition.  Finally, though the technology is pretty good, we cannot assure that it will always work on either your or our end, and in the setting of a video visit, we may have to convert it to a phone-only visit.  In either situation, we cannot ensure that we have a secure connection.  Are you willing to proceed?"  2. Give patient instructions for WebEx download to smartphone as below if video visit  3. Advise patient to be prepared with any vital sign or heart rhythm information, their current medicines, and a piece of paper and pen handy for any instructions they may receive the day of their visit  4. Inform patient they will receive a phone call 15 minutes prior to their appointment time (may be from unknown caller ID) so they should be prepared to answer  5. Confirm that appointment type is correct in Epic appointment notes (video vs telephone)    TELEPHONE CALL NOTE  BRYNDA HEICK has been deemed a candidate for a follow-up tele-health visit to limit community exposure during the Covid-19 pandemic. I spoke with the patient via phone to ensure availability of phone/video source, confirm preferred email & phone number, and discuss  instructions and expectations.  I reminded KYLER LERETTE to be prepared with any vital sign and/or heart rhythm information that could potentially be obtained via home monitoring, at the time of her visit. I reminded ASJAH RAUDA to expect a phone call at the time of her visit if her visit.  Did the patient verbally acknowledge consent to treatment? yes  Ace Gins 02/22/2019 2:34 PM   DOWNLOADING THE [place], go to [employer] and type in WebEx in the search bar. [place] [employer], the blue/green circle. The app is free but as with any other app downloads, their phone may require them to verify saved payment information or Apple password. The patient does NOT have to create an account.  - If Android, ask patient to go to [employer] and type in WebEx in the search bar. [place] [employer], the blue/green circle. The app is free but as with any other app downloads, their phone may require them to verify saved payment information or Android password. The patient does NOT have to create an account.   CONSENT FOR TELE-HEALTH VISIT - PLEASE REVIEW  I hereby voluntarily request, consent and authorize [facility] and its employed or contracted physicians, physician assistants, nurse practitioners or other licensed health care professionals (the Practitioner), to provide me with telemedicine health care services (the Services") as deemed necessary by the treating Practitioner. I acknowledge  and consent to receive the Services by the Practitioner via telemedicine. I understand that the telemedicine visit will involve communicating with the Practitioner through live audiovisual communication technology and the disclosure of certain medical information by electronic transmission. I acknowledge that I have been given the opportunity to request an in-person assessment or other available alternative prior to the telemedicine visit and am  voluntarily participating in the telemedicine visit.  I understand that I have the right to withhold or withdraw my consent to the use of telemedicine in the course of my care at any time, without affecting my right to future care or treatment, and that the Practitioner or I may terminate the telemedicine visit at any time. I understand that I have the right to inspect all information obtained and/or recorded in the course of the telemedicine visit and may receive copies of available information for a reasonable fee.  I understand that some of the potential risks of receiving the Services via telemedicine include:   Delay or interruption in medical evaluation due to technological equipment failure or disruption;  Information transmitted may not be sufficient (e.g. poor resolution of images) to allow for appropriate medical decision making by the Practitioner; and/or   In rare instances, security protocols could fail, causing a breach of personal health information.  Furthermore, I acknowledge that it is my responsibility to provide information about my medical history, conditions and care that is complete and accurate to the best of my ability. I acknowledge that Practitioner's advice, recommendations, and/or decision may be based on factors not within their control, such as incomplete or inaccurate data provided by me or distortions of diagnostic images or specimens that may result from electronic transmissions. I understand that the practice of medicine is not an exact science and that Practitioner makes no warranties or guarantees regarding treatment outcomes. I acknowledge that I will receive a copy of this consent concurrently upon execution via email to the email address I last provided but may also request a printed copy by calling the office of [facility].    I understand that my insurance will be billed for this visit.   I have read or had this consent read to me.  I understand the  contents of this consent, which adequately explains the benefits and risks of the Services being provided via telemedicine.   I have been provided ample opportunity to ask questions regarding this consent and the Services and have had my questions answered to my satisfaction.  I give my informed consent for the services to be provided through the use of telemedicine in my medical care  By participating in this telemedicine visit I agree to the above.

## 2019-03-10 ENCOUNTER — Other Ambulatory Visit: Payer: Self-pay

## 2019-03-10 ENCOUNTER — Telehealth (INDEPENDENT_AMBULATORY_CARE_PROVIDER_SITE_OTHER): Payer: PPO | Admitting: Cardiovascular Disease

## 2019-03-10 ENCOUNTER — Telehealth: Payer: Self-pay

## 2019-03-10 ENCOUNTER — Telehealth: Payer: PPO | Admitting: Nurse Practitioner

## 2019-03-10 DIAGNOSIS — E1159 Type 2 diabetes mellitus with other circulatory complications: Secondary | ICD-10-CM | POA: Diagnosis not present

## 2019-03-10 DIAGNOSIS — E7800 Pure hypercholesterolemia, unspecified: Secondary | ICD-10-CM | POA: Diagnosis not present

## 2019-03-10 DIAGNOSIS — I10 Essential (primary) hypertension: Secondary | ICD-10-CM

## 2019-03-10 DIAGNOSIS — I272 Pulmonary hypertension, unspecified: Secondary | ICD-10-CM

## 2019-03-10 DIAGNOSIS — I350 Nonrheumatic aortic (valve) stenosis: Secondary | ICD-10-CM

## 2019-03-10 NOTE — Progress Notes (Signed)
Virtual Visit via Video Note   This visit type was conducted due to national recommendations for restrictions regarding the COVID-19 Pandemic (e.g. social distancing) in an effort to limit this patient's exposure and mitigate transmission in our community.  Due to her co-morbid illnesses, this patient is at least at moderate risk for complications without adequate follow up.  This format is felt to be most appropriate for this patient at this time.  All issues noted in this document were discussed and addressed.  A limited physical exam was performed with this format.  Please refer to the patient's chart for her consent to telehealth for [facility].   I connected with  [name] on 03/10/19 by a video enabled telemedicine application and verified that I am speaking with the correct person using two identifiers. I discussed the limitations of evaluation and management by telemedicine. The patient expressed understanding and agreed to proceed.   Evaluation Performed:  Follow-up visit  Date:  03/10/2019   ID:  Jasmine Webster, DOB Jun 15, 1926, MRN [id number]  Patient Location:  [place] [address]   Provider location:   [facility], [place] office  PCP:  Jerrol Banana., MD  Cardiologist:  [facility]   Chief Complaint: Back pain    History of Present Illness:    Jasmine Webster is a 85 y.o. female who presents via audio/video conferencing for a telehealth visit today.   The patient does not symptoms concerning for COVID-19 infection (fever, chills, cough, or new SHORTNESS OF BREATH).   Patient has a past medical history of Diabetes type 2, hemoglobin A1c 6.8 GERD Hyperlipidemia Hypertension Cardiac murmur Arthritis Non smoker Who presents for chest pain on exertion, angina, murmur Echocardiogram confirming moderate aortic valve stenosis, elevated right heart pressures  Widowed, no smoker Daughter is helping her with  activities  Recent echocardiogram performed for murmur, chest tightness This shows moderate aortic valve stenosis Normal ejection fraction 65% Elevated right heart pressures  We discussed each of these findings She denies any chest tightness on exertion, feels well with no complaints Denies any leg swelling, PND orthopnea Does not think that she has fluid overload  Reports having one episode back pain on the left scapular region when she was laying down in the bed lasted 5 minutes Laid there until symptoms resolved, no further episodes  Blood pressure stable BP: 135/53 Pulse 80 Respirations 16  Lab work reviewed personally by myself Total Chol 273/ LDL 185 HBA1C 6.8 CR 1.03 Glucose 117   Prior CV studies:   The following studies were reviewed today:  Echo March 2020  1. The left ventricle has normal systolic function with an ejection fraction of 60-65%. The cavity size was normal. There is mildly increased left ventricular wall thickness. Left ventricular diastolic Doppler parameters are consistent with impaired  relaxation.  2. The right ventricle has normal systolic function. The cavity was normal. There is no increase in right ventricular wall thickness. Right ventricular systolic pressure is severely elevated with an estimated pressure of 62.0 mmHg.  3. Tricuspid valve regurgitation is mild-moderate.  4. Mmoderate stenosis of the aortic valve. Mean gradient 26 mm Hg    Past Medical History:  Diagnosis Date  . Arthritis   . Cataract    cataract removal bilaterally approx 20 years ago  . Chronic back pain   . Coronary artery disease   . Diabetes mellitus without complication ([place])   . Heart  murmur   . Hypertension    Past Surgical History:  Procedure Laterality Date  . ABDOMINAL HYSTERECTOMY    . APPENDECTOMY    . BACK SURGERY     three  . CARDIAC CATHETERIZATION     6+yrs no stents Dr. Verneda Skill  . CATARACT EXTRACTION    . THORACOLUMBAR [place]       No outpatient medications have been marked as taking for the 03/10/19 encounter (Appointment) with Minna Merritts, MD.     Allergies:   Etodolac; Naproxen; Statins; Latex; Terbinafine; and Terbinafine hcl   Social History   Tobacco Use  . Smoking status: Never Smoker  . Smokeless tobacco: Never Used  Substance Use Topics  . Alcohol use: No    Alcohol/week: 0.0 standard drinks  . Drug use: No     Current Outpatient Medications on File Prior to Visit  Medication Sig Dispense Refill  . ALPRAZolam (XANAX) 0.5 MG tablet TAKE 1 TABLET BY MOUTH EVERY DAY AT BEDTIME AS NEEDED 30 tablet 2  . amLODipine (NORVASC) 5 MG tablet TAKE 1 TABLET BY MOUTH EVERY DAY 90 tablet 3  . Artificial Tear Ointment (DRY EYES OP) Apply 1 drop to eye daily.    Marland Kitchen aspirin EC 81 MG tablet Take 81 mg by mouth daily.    Marland Kitchen gabapentin (NEURONTIN) 100 MG capsule 200 mg 2 (two) times daily.     Marland Kitchen glucose blood (ONE TOUCH ULTRA TEST) test strip Check sugar twice daily. 100 each 3  . glucose blood (ONE TOUCH ULTRA TEST) test strip CHECK SUGAR TWICE A DAY 200 each 3  . HYDROcodone-acetaminophen (NORCO/VICODIN) 5-325 MG tablet Take 1 tablet by mouth every 6 (six) hours as needed for moderate pain.    . Insulin Glargine (LANTUS SOLOSTAR) 100 UNIT/ML Solostar Pen INJECT 28 UNITS SUBCUTANEOUSLY AT BEDTIME 27 pen 3  . Insulin Pen Needle (B-D ULTRAFINE III SHORT PEN) 31G X 8 MM MISC CHECK SUGAR 2 TIMES DAILY, DX E11.9 (NEEDS ULTRA FINE PEN NEEDLES) 50 each 12  . Insulin Pen Needle (FIFTY50 PEN NEEDLES) 31G X 8 MM MISC Check sugar 2 times daily, DX E11.9 (needs ultra fine pen needles)    . isosorbide mononitrate (IMDUR) 30 MG 24 hr tablet Take 1 tablet (30 mg total) by mouth daily. 30 tablet 3  . Lancets (ONETOUCH ULTRASOFT) lancets Check sugar twice daily DX E11.9 100 each 12  . Multiple Vitamins-Minerals (PRESERVISION AREDS) CAPS Take 1 capsule by mouth 2 (two) times daily.      . nitroGLYCERIN (NITROSTAT) 0.4 MG SL tablet Place 1 tablet (0.4 mg total) under the tongue every 5 (five) minutes as needed for chest pain. 25 tablet 3  . Omega-3 Fatty Acids (FISH OIL PO) Take by mouth daily.    Marland Kitchen omeprazole (PRILOSEC) 20 MG capsule TAKE 1 CAPSULE BY MOUTH EVERY DAY 30 capsule 3  . quinapril-hydrochlorothiazide (ACCURETIC) 20-12.5 MG tablet TAKE 1 TABLET BY MOUTH TWICE A DAY 180 tablet 3  . rosuvastatin (CRESTOR) 5 MG tablet Take 1 tablet (5 mg total) by mouth daily. 30 tablet 11   No current facility-administered medications on file prior to visit.      Family Hx: The patient's family history includes Cancer in her brother and brother; Diabetes in her father; Heart attack in her mother; Heart disease in her mother and sister; Pneumonia in her father; Stroke in her brother, father, and sister; Sudden death in her brother, sister,  and sister.  ROS:   Please see the history of present illness.    Review of Systems  Constitutional: Negative.   Respiratory: Negative.   Cardiovascular: Negative.   Gastrointestinal: Negative.   Musculoskeletal: Positive for back pain.  Neurological: Negative.   Psychiatric/Behavioral: Negative.   All other systems reviewed and are negative.     Labs/Other Tests and Data Reviewed:    Recent Labs: 05/13/2018: ALT 15; BUN 23; Creatinine, Ser 1.03; Hemoglobin 11.8; Platelets CANCELED; Potassium 4.2; Sodium 136; TSH 2.990   Recent Lipid Panel Lab Results  Component Value Date/Time   CHOL 273 (H) 05/13/2018 09:47 AM   TRIG 220 (H) 05/13/2018 09:47 AM   HDL 44 05/13/2018 09:47 AM   CHOLHDL 6.2 (H) 05/13/2018 09:47 AM   LDLCALC 185 (H) 05/13/2018 09:47 AM    Wt Readings from Last 3 Encounters:  01/12/19 163 lb (73.9 kg)  12/29/18 162 lb 12 oz (73.8 kg)  12/22/18 163 lb 3.2 oz (74 kg)     Exam:    Vital Signs: Vital signs may also be detailed in the HPI There were no vitals taken for this visit.  Wt Readings from Last 3  Encounters:  01/12/19 163 lb (73.9 kg)  12/29/18 162 lb 12 oz (73.8 kg)  12/22/18 163 lb 3.2 oz (74 kg)   Temp Readings from Last 3 Encounters:  01/12/19 97.8 F (36.6 C) (Oral)  12/22/18 98.3 F (36.8 C) (Oral)  09/15/18 98 F (36.7 C) (Oral)   BP Readings from Last 3 Encounters:  01/12/19 118/62  12/29/18 140/64  12/22/18 (!) 116/56   Pulse Readings from Last 3 Encounters:  01/12/19 70  12/29/18 73  12/22/18 82     Well nourished, well developed female in no acute distress. Constitutional:  oriented to person, place, and time. No distress.  Head: Normocephalic and atraumatic.  Eyes:  no discharge. No scleral icterus.  Neck: Normal range of motion. Neck supple.  Pulmonary/Chest: No audible wheezing, no distress, appears comfortable Musculoskeletal: Normal range of motion.  no  tenderness or deformity.  Neurological:   Coordination normal. Full exam not performed Skin:  No rash Psychiatric:  normal mood and affect. behavior is normal. Thought content normal.    ASSESSMENT & PLAN:    Aortic valve stenosis, etiology of cardiac valve disease unspecified - Plan:  Long discussion concerning echocardiogram findings Discussed aortic valve stenosis, Discussed monitoring, treatment in the future For now would recommend repeat echocardiogram in 1 year Suggested she call us for any chest tightness, worsening shortness of breath, near syncope symptoms  Pulmonary hypertension ([place]) - Plan: ECHOCARDIOGRAM COMPLETE Denies leg swelling abdominal bloating shortness of breath on exertion Unclear if numbers are accurate, she does not want right heart catheterization to confirm high pressures -We will monitor for now, repeat echocardiogram 1 year     COVID-19 Education: The signs and symptoms of COVID-19 were discussed with the patient and how to seek care for testing (follow up with PCP or arrange E-visit).  The importance of social distancing was discussed today.  Patient Risk:    After full review of this patients clinical status, I feel that they are at least moderate risk at this time.  Time:   Today, I have spent 25 minutes with the patient with telehealth technology discussing the cardiac and medical problems/diagnoses detailed above   10 min spent reviewing the chart prior to patient visit today   Medication Adjustments/Labs and Tests Ordered: Current medicines are reviewed at length  with the patient today.  Concerns regarding medicines are outlined above.   Tests Ordered: Echocardiogram in 1 year   Medication Changes: No changes made   Disposition: Follow-up in 12 months after echocardiogram   Signed, Ida Rogue, MD  03/10/2019 3:30 PM    [place] [facility] [address] [place] [address], [place], [place] [address]

## 2019-03-10 NOTE — Telephone Encounter (Signed)
-----   Message from Minna Merritts, MD sent at 03/07/2019 12:04 PM EDT ----- Regarding: f/u I know ms Peavler, Can we move her on to my schedule 2:20 pm(or around there) on same day 4/22 For evisit Thx tGollan ----- Message ----- From: Interface, [employer] Sent: 01/27/2019   6:54 PM EDT To: Minna Merritts, MD

## 2019-03-10 NOTE — Telephone Encounter (Signed)
Patient was moved on to Dr. Gwenyth Ober Schedule.  Scheduled for 03/10/2019 @ 3:00pm

## 2019-03-10 NOTE — Patient Instructions (Addendum)
Medication Instructions:  No changes  If you need a refill on your cardiac medications before your next appointment, please call your pharmacy.    Lab work: No new labs needed   If you have labs (blood work) drawn today and your tests are completely normal, you will receive your results only by: Marland Kitchen MyChart Message (if you have MyChart) OR . A paper copy in the mail If you have any lab test that is abnormal or we need to change your treatment, we will call you to review the results.   Testing/Procedures: - Your physician has requested that you have an echocardiogram in 1 year (aortic valve stenosis/ pulmonary hypertension). Echocardiography is a painless test that uses sound waves to create images of your heart. It provides your doctor with information about the size and shape of your heart and how well your heart's chambers and valves are working. This procedure takes approximately one hour. There are no restrictions for this procedure.  We will be in touch with you closer to 1 year to schedule your echocardiogram   Follow-Up: At [facility], you and your health needs are our priority.  As part of our continuing mission to provide you with exceptional heart care, we have created designated Provider Care Teams.  These Care Teams include your primary Cardiologist (physician) and Advanced Practice Providers (APPs -  Physician Assistants and Nurse Practitioners) who all work together to provide you with the care you need, when you need it.   . You will need a follow up appointment in 12 months after echo .   Please call our office 2 months in advance to schedule this appointment.    . Providers on your designated Care Team:   . Murray Hodgkins, NP . Christell Faith, PA-C . Marrianne Mood, PA-C  Any Other Special Instructions Will Be Listed Below (If Applicable).  For educational health videos Log in to : [email or web address] Or : [email or web address], password : [email or web address]

## 2019-03-17 ENCOUNTER — Other Ambulatory Visit: Payer: Self-pay | Admitting: Family Medicine

## 2019-03-17 DIAGNOSIS — R079 Chest pain, unspecified: Secondary | ICD-10-CM

## 2019-03-17 DIAGNOSIS — I209 Angina pectoris, unspecified: Secondary | ICD-10-CM

## 2019-04-11 ENCOUNTER — Other Ambulatory Visit: Payer: Self-pay | Admitting: Family Medicine

## 2019-04-11 DIAGNOSIS — R079 Chest pain, unspecified: Secondary | ICD-10-CM

## 2019-04-11 DIAGNOSIS — I209 Angina pectoris, unspecified: Secondary | ICD-10-CM

## 2019-05-11 ENCOUNTER — Encounter: Payer: Self-pay | Admitting: Family Medicine

## 2019-05-11 ENCOUNTER — Other Ambulatory Visit: Payer: Self-pay

## 2019-05-11 ENCOUNTER — Ambulatory Visit (INDEPENDENT_AMBULATORY_CARE_PROVIDER_SITE_OTHER): Payer: PPO | Admitting: Family Medicine

## 2019-05-11 VITALS — BP 124/60 | HR 68 | Temp 98.4°F | Resp 16 | Ht 64.5 in | Wt 161.0 lb

## 2019-05-11 DIAGNOSIS — E1159 Type 2 diabetes mellitus with other circulatory complications: Secondary | ICD-10-CM | POA: Diagnosis not present

## 2019-05-11 DIAGNOSIS — I350 Nonrheumatic aortic (valve) stenosis: Secondary | ICD-10-CM

## 2019-05-11 DIAGNOSIS — M25551 Pain in right hip: Secondary | ICD-10-CM | POA: Diagnosis not present

## 2019-05-11 DIAGNOSIS — I2510 Atherosclerotic heart disease of native coronary artery without angina pectoris: Secondary | ICD-10-CM | POA: Diagnosis not present

## 2019-05-11 DIAGNOSIS — I209 Angina pectoris, unspecified: Secondary | ICD-10-CM | POA: Diagnosis not present

## 2019-05-11 DIAGNOSIS — E7800 Pure hypercholesterolemia, unspecified: Secondary | ICD-10-CM

## 2019-05-11 DIAGNOSIS — I10 Essential (primary) hypertension: Secondary | ICD-10-CM

## 2019-05-11 MED ORDER — ISOSORBIDE MONONITRATE ER 30 MG PO TB24: 30.0000 mg | ORAL_TABLET | Freq: Every day | ORAL | 12 refills | Status: DC

## 2019-05-11 NOTE — Progress Notes (Signed)
Patient: Jasmine Webster Female    DOB: 08-Jan-1926   85 y.o.   MRN: [id number] Visit Date: 05/11/2019  Today's Provider: [name], MD   Chief Complaint  Patient presents with  . Hypertension  . Hyperlipidemia  . Diabetes   Subjective:    HPI    Hypertension, follow-up:  BP Readings from Last 3 Encounters:  05/11/19 124/60  01/12/19 118/62  12/29/18 140/64    She was last seen for hypertension 4 months ago.  BP at that visit was 118/62. Management since that visit includes no changes. She reports good compliance with treatment. She is not having side effects.  She is not exercising. She is adherent to low salt diet.   Outside blood pressures are checked occasionally. She is experiencing none.  Patient denies exertional chest pressure/discomfort, lower extremity edema and palpitations.   Cardiovascular risk factors include diabetes mellitus and dyslipidemia.   Weight trend: stable Wt Readings from Last 3 Encounters:  05/11/19 161 lb (73 kg)  01/12/19 163 lb (73.9 kg)  12/29/18 162 lb 12 oz (73.8 kg)    Current diet: well balanced   Lipid/Cholesterol, Follow-up:   Last seen for this4 months ago.  Management changes since that visit include no changes. . Last Lipid Panel:    Component Value Date/Time   CHOL 273 (H) 05/13/2018 0947   TRIG 220 (H) 05/13/2018 0947   HDL 44 05/13/2018 0947   CHOLHDL 6.2 (H) 05/13/2018 0947   LDLCALC 185 (H) 05/13/2018 0947    Risk factors for vascular disease include diabetes mellitus  She reports good compliance with treatment. She is not having side effects.  Current symptoms include none and have been stable.   Diabetes Mellitus Type II, Follow-up:   Lab Results  Component Value Date   HGBA1C 7.0 (A) 09/15/2018   HGBA1C 6.8 (H) 05/13/2018   HGBA1C 6.7 01/23/2018    Last seen for diabetes 4 months ago.  Management since then includes no changes. She reports good compliance with treatment.  She is not having side effects.  Current symptoms include none and have been stable.  Episodes of hypoglycemia? no   Current Insulin Regimen: none Most Recent Eye Exam: up to date   Chest pain/DOE stable -improved. No PND/orthopnea.  Allergies  Allergen Reactions  . Etodolac Nausea Only, Other (See Comments) and Anxiety    Dizziness, too  . Naproxen Other (See Comments) and Anxiety    Gave her an ulcer after taking it for years  . Statins Other (See Comments)    No energy and myalgias  . Latex Rash  . Terbinafine Other (See Comments) and Rash    "Maybe made me nervous"  . Terbinafine Hcl Rash     Current Outpatient Medications:  .  ALPRAZolam (XANAX) 0.5 MG tablet, TAKE 1 TABLET BY MOUTH EVERY DAY AT BEDTIME AS NEEDED, Disp: 30 tablet, Rfl: 2 .  amLODipine (NORVASC) 5 MG tablet, TAKE 1 TABLET BY MOUTH EVERY DAY, Disp: 90 tablet, Rfl: 3 .  Artificial Tear Ointment (DRY EYES OP), Apply 1 drop to eye daily., Disp: , Rfl:  .  aspirin EC 81 MG tablet, Take 81 mg by mouth daily., Disp: , Rfl:  .  gabapentin (NEURONTIN) 100 MG capsule, 200 mg 2 (two) times daily. , Disp: , Rfl:  .  glucose blood (ONE TOUCH ULTRA TEST) test strip, Check sugar twice daily., Disp: 100 each, Rfl: 3 .  glucose blood (ONE TOUCH ULTRA  TEST) test strip, CHECK SUGAR TWICE A DAY, Disp: 200 each, Rfl: 3 .  HYDROcodone-acetaminophen (NORCO/VICODIN) 5-325 MG tablet, Take 1 tablet by mouth every 6 (six) hours as needed for moderate pain., Disp: , Rfl:  .  Insulin Glargine (LANTUS SOLOSTAR) 100 UNIT/ML Solostar Pen, INJECT 28 UNITS SUBCUTANEOUSLY AT BEDTIME, Disp: 27 pen, Rfl: 3 .  Insulin Pen Needle (B-D ULTRAFINE III SHORT PEN) 31G X 8 MM MISC, CHECK SUGAR 2 TIMES DAILY, DX E11.9 (NEEDS ULTRA FINE PEN NEEDLES), Disp: 50 each, Rfl: 12 .  isosorbide mononitrate (IMDUR) 30 MG 24 hr tablet, Take 1 tablet (30 mg total) by mouth daily., Disp: 30 tablet, Rfl: 3 .  Lancets (ONETOUCH ULTRASOFT) lancets, Check sugar twice  daily DX E11.9, Disp: 100 each, Rfl: 12 .  Multiple Vitamins-Minerals (PRESERVISION AREDS) CAPS, Take 1 capsule by mouth 2 (two) times daily. , Disp: , Rfl:  .  nitroGLYCERIN (NITROSTAT) 0.4 MG SL tablet, PLACE 1 TABLET (0.4 MG TOTAL) UNDER THE TONGUE EVERY 5 (FIVE) MINUTES AS NEEDED FOR CHEST PAIN., Disp: 75 tablet, Rfl: 1 .  Omega-3 Fatty Acids (FISH OIL PO), Take by mouth daily., Disp: , Rfl:  .  quinapril-hydrochlorothiazide (ACCURETIC) 20-12.5 MG tablet, TAKE 1 TABLET BY MOUTH TWICE A DAY, Disp: 180 tablet, Rfl: 3 .  rosuvastatin (CRESTOR) 5 MG tablet, Take 1 tablet (5 mg total) by mouth daily., Disp: 30 tablet, Rfl: 11 .  Insulin Pen Needle (FIFTY50 PEN NEEDLES) 31G X 8 MM MISC, Check sugar 2 times daily, DX E11.9 (needs ultra fine pen needles), Disp: , Rfl:  .  omeprazole (PRILOSEC) 20 MG capsule, TAKE 1 CAPSULE BY MOUTH EVERY DAY, Disp: 30 capsule, Rfl: 3  Review of Systems  Constitutional: Negative for activity change, appetite change, chills, diaphoresis, fatigue and fever.  Eyes: Negative.   Respiratory: Negative for cough and shortness of breath.   Cardiovascular: Negative for chest pain and leg swelling.  Gastrointestinal: Negative.   Endocrine: Negative for cold intolerance, heat intolerance, polydipsia, polyphagia and polyuria.  Genitourinary: Negative for difficulty urinating.  Skin: Negative for color change, pallor, rash and wound.  Allergic/Immunologic: Negative.   Neurological: Negative for dizziness, light-headedness and headaches.  Psychiatric/Behavioral: Negative for agitation, decreased concentration, self-injury, sleep disturbance and suicidal ideas.    Social History   Tobacco Use  . Smoking status: Never Smoker  . Smokeless tobacco: Never Used  Substance Use Topics  . Alcohol use: No    Alcohol/week: 0.0 standard drinks      Objective:   BP 124/60 (BP Location: Left Arm, Patient Position: Sitting, Cuff Size: Normal)   Pulse 68   Temp 98.4 F (36.9 C)    Resp 16   Ht 5' 4.5" (1.638 m)   Wt 161 lb (73 kg)   SpO2 98%   BMI 27.21 kg/m  Vitals:   05/11/19 1116  BP: 124/60  Pulse: 68  Resp: 16  Temp: 98.4 F (36.9 C)  SpO2: 98%  Weight: 161 lb (73 kg)  Height: 5' 4.5" (1.638 m)     Physical Exam Vitals signs reviewed.  Constitutional:      Appearance: She is well-developed.  HENT:     Head: Normocephalic and atraumatic.     Right Ear: External ear normal.     Left Ear: External ear normal.     Nose: Nose normal.  Eyes:     Conjunctiva/sclera: Conjunctivae normal.     Pupils: Pupils are equal, round, and reactive to light.  Neck:  Musculoskeletal: Normal range of motion and neck supple.     Thyroid: No thyromegaly.  Cardiovascular:     Rate and Rhythm: Normal rate and regular rhythm.     Heart sounds: Murmur present.  Pulmonary:     Effort: Pulmonary effort is normal.     Breath sounds: Normal breath sounds.  Abdominal:     Palpations: Abdomen is soft.  Musculoskeletal: Normal range of motion.  Skin:    General: Skin is warm and dry.  Neurological:     General: No focal deficit present.     Mental Status: She is alert and oriented to person, place, and time.     Deep Tendon Reflexes: Reflexes are normal and symmetric.  Psychiatric:        Mood and Affect: Mood normal.        Behavior: Behavior normal.        Thought Content: Thought content normal.        Judgment: Judgment normal.      No results found for any visits on 05/11/19.     Assessment & Plan    1. Controlled type 2 diabetes mellitus with other circulatory complication, unspecified whether long term insulin use (HCC) RTc 4 months. - Hemoglobin A1c - CBC with Differential/Platelet  2. Essential (primary) hypertension On norvasc. - Comprehensive metabolic panel - TSH  3. Hypercholesteremia On Crestor. - Lipid panel  4. Pain of right hip joint  - Ambulatory referral to Orthopedic Surgery  5. Cardiovascular disease All risk  factors treated.  6. Angina pectoris ([place]) Stable on [place].  7. Nonrheumatic aortic valve stenosis Per cardiology     [name], MD  [facility]

## 2019-05-11 NOTE — Progress Notes (Deleted)
Patient: Jasmine Webster Female    DOB: 07-05-1926   85 y.o.   MRN: [id number] Visit Date: 05/11/2019  Today's Provider: [name], MD   No chief complaint on file.  Subjective:     HPI  Diabetes Mellitus Type II, Follow-up:   Lab Results  Component Value Date   HGBA1C 7.0 (A) 09/15/2018   HGBA1C 6.8 (H) 05/13/2018   HGBA1C 6.7 01/23/2018    Last seen for diabetes 7 months ago.  Management since then includes none. She reports {excellent/good/fair/poor:19665} compliance with treatment. She {ACTION; IS/IS FWY:[id number]} having side effects. *** Current symptoms include {Symptoms; diabetes:14075} and have been {Desc; course:15616}. Home blood sugar records: {diabetes glucometry results:16657}  Episodes of hypoglycemia? {yes***/no:17258}   Current Insulin Regimen: *** Most Recent Eye Exam: *** Weight trend: {trend:16658} {Prior visit with dietician:20300:::1} {Current exercise:16438:::1} {Current diet habits:16563:::1}  Pertinent Labs:    Component Value Date/Time   CHOL 273 (H) 05/13/2018 0947   TRIG 220 (H) 05/13/2018 0947   HDL 44 05/13/2018 0947   LDLCALC 185 (H) 05/13/2018 0947   CREATININE 1.03 (H) 05/13/2018 0947    Wt Readings from Last 3 Encounters:  01/12/19 163 lb (73.9 kg)  12/29/18 162 lb 12 oz (73.8 kg)  12/22/18 163 lb 3.2 oz (74 kg)    ------------------------------------------------------------------------  Hypertension, follow-up:  BP Readings from Last 3 Encounters:  01/12/19 118/62  12/29/18 140/64  12/22/18 (!) 116/56    She was last seen for hypertension 7 months ago.  BP at that visit was 140/60. Management changes since that visit include none. She reports {excellent/good/fair/poor:19665} compliance with treatment. She {ACTION; IS/IS OYD:[id number]} having side effects. *** She {is/is not:9024} exercising. She {is/is not:9024} adherent to low salt diet.   Outside blood pressures are ***. She is experiencing  {Symptoms; cardiac:12860}.  Patient denies {Symptoms; cardiac:12860}.   Cardiovascular risk factors include {cv risk factors:510}.  Use of agents associated with hypertension: {bp agents assoc with hypertension:511::"none"}.     Weight trend: {trend:16658} Wt Readings from Last 3 Encounters:  01/12/19 163 lb (73.9 kg)  12/29/18 162 lb 12 oz (73.8 kg)  12/22/18 163 lb 3.2 oz (74 kg)    Current diet: {diet habits:16563}  ------------------------------------------------------------------------  Lipid/Cholesterol, Follow-up:   Last seen for this7 months ago.  Management changes since that visit include ***. Marland Kitchen Last Lipid Panel:    Component Value Date/Time   CHOL 273 (H) 05/13/2018 0947   TRIG 220 (H) 05/13/2018 0947   HDL 44 05/13/2018 0947   CHOLHDL 6.2 (H) 05/13/2018 0947   LDLCALC 185 (H) 05/13/2018 0947    Risk factors for vascular disease include {risk factors atherosclerosis:10337}  She reports {excellent/good/fair/poor:19665} compliance with treatment. She {ACTION; IS/IS VEH:[id number]} having side effects.  Current symptoms include {Symptoms; diabetes:14075} and have been {Desc; course:15616}. Weight trend: {trend:16658} Prior visit with dietician: {yes/no:17258} Current diet: {diet habits:16563} Current exercise: {exercise types:16438}  Wt Readings from Last 3 Encounters:  01/12/19 163 lb (73.9 kg)  12/29/18 162 lb 12 oz (73.8 kg)  12/22/18 163 lb 3.2 oz (74 kg)    -------------------------------------------------------------------  Allergies  Allergen Reactions  . Etodolac Nausea Only, Other (See Comments) and Anxiety    Dizziness, too  . Naproxen Other (See Comments) and Anxiety    Gave her an ulcer after taking it for years  . Statins Other (See Comments)    No energy and myalgias  . Latex Rash  . Terbinafine Other (See Comments) and Rash    "  Maybe made me nervous"  . Terbinafine Hcl Rash     Current Outpatient Medications:  .  ALPRAZolam  (XANAX) 0.5 MG tablet, TAKE 1 TABLET BY MOUTH EVERY DAY AT BEDTIME AS NEEDED, Disp: 30 tablet, Rfl: 2 .  amLODipine (NORVASC) 5 MG tablet, TAKE 1 TABLET BY MOUTH EVERY DAY, Disp: 90 tablet, Rfl: 3 .  Artificial Tear Ointment (DRY EYES OP), Apply 1 drop to eye daily., Disp: , Rfl:  .  aspirin EC 81 MG tablet, Take 81 mg by mouth daily., Disp: , Rfl:  .  gabapentin (NEURONTIN) 100 MG capsule, 200 mg 2 (two) times daily. , Disp: , Rfl:  .  glucose blood (ONE TOUCH ULTRA TEST) test strip, Check sugar twice daily., Disp: 100 each, Rfl: 3 .  glucose blood (ONE TOUCH ULTRA TEST) test strip, CHECK SUGAR TWICE A DAY, Disp: 200 each, Rfl: 3 .  HYDROcodone-acetaminophen (NORCO/VICODIN) 5-325 MG tablet, Take 1 tablet by mouth every 6 (six) hours as needed for moderate pain., Disp: , Rfl:  .  Insulin Glargine (LANTUS SOLOSTAR) 100 UNIT/ML Solostar Pen, INJECT 28 UNITS SUBCUTANEOUSLY AT BEDTIME, Disp: 27 pen, Rfl: 3 .  Insulin Pen Needle (B-D ULTRAFINE III SHORT PEN) 31G X 8 MM MISC, CHECK SUGAR 2 TIMES DAILY, DX E11.9 (NEEDS ULTRA FINE PEN NEEDLES), Disp: 50 each, Rfl: 12 .  Insulin Pen Needle (FIFTY50 PEN NEEDLES) 31G X 8 MM MISC, Check sugar 2 times daily, DX E11.9 (needs ultra fine pen needles), Disp: , Rfl:  .  isosorbide mononitrate (IMDUR) 30 MG 24 hr tablet, Take 1 tablet (30 mg total) by mouth daily., Disp: 30 tablet, Rfl: 3 .  Lancets (ONETOUCH ULTRASOFT) lancets, Check sugar twice daily DX E11.9, Disp: 100 each, Rfl: 12 .  Multiple Vitamins-Minerals (PRESERVISION AREDS) CAPS, Take 1 capsule by mouth 2 (two) times daily. , Disp: , Rfl:  .  nitroGLYCERIN (NITROSTAT) 0.4 MG SL tablet, PLACE 1 TABLET (0.4 MG TOTAL) UNDER THE TONGUE EVERY 5 (FIVE) MINUTES AS NEEDED FOR CHEST PAIN., Disp: 75 tablet, Rfl: 1 .  Omega-3 Fatty Acids (FISH OIL PO), Take by mouth daily., Disp: , Rfl:  .  omeprazole (PRILOSEC) 20 MG capsule, TAKE 1 CAPSULE BY MOUTH EVERY DAY, Disp: 30 capsule, Rfl: 3 .   quinapril-hydrochlorothiazide (ACCURETIC) 20-12.5 MG tablet, TAKE 1 TABLET BY MOUTH TWICE A DAY, Disp: 180 tablet, Rfl: 3 .  rosuvastatin (CRESTOR) 5 MG tablet, Take 1 tablet (5 mg total) by mouth daily., Disp: 30 tablet, Rfl: 11  Review of Systems  Social History   Tobacco Use  . Smoking status: Never Smoker  . Smokeless tobacco: Never Used  Substance Use Topics  . Alcohol use: No    Alcohol/week: 0.0 standard drinks      Objective:   There were no vitals taken for this visit. There were no vitals filed for this visit.   Physical Exam   No results found for any visits on 05/11/19.     Assessment & Plan        [name], MD  [facility]

## 2019-05-14 DIAGNOSIS — I10 Essential (primary) hypertension: Secondary | ICD-10-CM | POA: Diagnosis not present

## 2019-05-14 DIAGNOSIS — E7800 Pure hypercholesterolemia, unspecified: Secondary | ICD-10-CM | POA: Diagnosis not present

## 2019-05-14 DIAGNOSIS — E1159 Type 2 diabetes mellitus with other circulatory complications: Secondary | ICD-10-CM | POA: Diagnosis not present

## 2019-05-15 LAB — COMPREHENSIVE METABOLIC PANEL
ALT: 20 IU/L (ref 0–32)
AST: 24 IU/L (ref 0–40)
Albumin/Globulin Ratio: 2.4 — ABNORMAL HIGH (ref 1.2–2.2)
Albumin: 4.3 g/dL (ref 3.5–4.6)
Alkaline Phosphatase: 118 IU/L — ABNORMAL HIGH (ref 39–117)
BUN/Creatinine Ratio: 22 (ref 12–28)
BUN: 23 mg/dL (ref 10–36)
Bilirubin Total: 0.3 mg/dL (ref 0.0–1.2)
CO2: 24 mmol/L (ref 20–29)
Calcium: 9.2 mg/dL (ref 8.7–10.3)
Chloride: 100 mmol/L (ref 96–106)
Creatinine, Ser: 1.05 mg/dL — ABNORMAL HIGH (ref 0.57–1.00)
GFR calc Af Amer: 54 mL/min/{1.73_m2} — ABNORMAL LOW (ref >59)
GFR calc non Af Amer: 47 mL/min/{1.73_m2} — ABNORMAL LOW (ref >59)
Globulin, Total: 1.8 g/dL (ref 1.5–4.5)
Glucose: 101 mg/dL — ABNORMAL HIGH (ref 65–99)
Potassium: 4 mmol/L (ref 3.5–5.2)
Sodium: 137 mmol/L (ref 134–144)
Total Protein: 6.1 g/dL (ref 6.0–8.5)

## 2019-05-15 LAB — LIPID PANEL
Chol/HDL Ratio: 3.8 ratio (ref 0.0–4.4)
Cholesterol, Total: 171 mg/dL (ref 100–199)
HDL: 45 mg/dL (ref >39)
LDL Calculated: 83 mg/dL (ref 0–99)
Triglycerides: 217 mg/dL — ABNORMAL HIGH (ref 0–149)
VLDL Cholesterol Cal: 43 mg/dL — ABNORMAL HIGH (ref 5–40)

## 2019-05-15 LAB — CBC WITH DIFFERENTIAL/PLATELET
Basophils Absolute: 0.1 10*3/uL (ref 0.0–0.2)
Basos: 1 %
EOS (ABSOLUTE): 0.2 10*3/uL (ref 0.0–0.4)
Eos: 3 %
Hematocrit: 39.2 % (ref 34.0–46.6)
Hemoglobin: 12.9 g/dL (ref 11.1–15.9)
Immature Grans (Abs): 0 10*3/uL (ref 0.0–0.1)
Immature Granulocytes: 0 %
Lymphocytes Absolute: 3.2 10*3/uL — ABNORMAL HIGH (ref 0.7–3.1)
Lymphs: 53 %
MCH: 30.4 pg (ref 26.6–33.0)
MCHC: 32.9 g/dL (ref 31.5–35.7)
MCV: 93 fL (ref 79–97)
Monocytes Absolute: 0.7 10*3/uL (ref 0.1–0.9)
Monocytes: 12 %
Neutrophils Absolute: 1.8 10*3/uL (ref 1.4–7.0)
Neutrophils: 31 %
RBC: 4.24 x10E6/uL (ref 3.77–5.28)
RDW: 13.1 % (ref 11.7–15.4)
WBC: 5.9 10*3/uL (ref 3.4–10.8)

## 2019-05-15 LAB — HEMOGLOBIN A1C
Est. average glucose Bld gHb Est-mCnc: 163 mg/dL
Hgb A1c MFr Bld: 7.3 % — ABNORMAL HIGH (ref 4.8–5.6)

## 2019-05-15 LAB — TSH: TSH: 4.14 u[IU]/mL (ref 0.450–4.500)

## 2019-05-18 ENCOUNTER — Telehealth: Payer: Self-pay

## 2019-05-18 NOTE — Telephone Encounter (Signed)
Patient notified about lab results.

## 2019-05-18 NOTE — Telephone Encounter (Signed)
-----   Message from Jerrol Banana., MD sent at 05/18/2019  7:45 AM EDT ----- Stable.

## 2019-05-21 ENCOUNTER — Other Ambulatory Visit: Payer: Self-pay | Admitting: Family Medicine

## 2019-05-21 DIAGNOSIS — F419 Anxiety disorder, unspecified: Secondary | ICD-10-CM

## 2019-05-25 ENCOUNTER — Telehealth: Payer: Self-pay | Admitting: Cardiovascular Disease

## 2019-05-25 ENCOUNTER — Telehealth: Payer: Self-pay

## 2019-05-25 NOTE — Telephone Encounter (Signed)
° °  STAT if patient feels like he/she is going to faint   1) Are you dizzy now? No   2) Do you feel faint or have you passed out? No   3) Do you have any other symptoms? Patient c/o nausea nervousness and fatigue x 3 days   4) Have you checked your HR and BP (record if available)?  No #'s but says its good    Patient advised to also call pcp

## 2019-05-25 NOTE — Telephone Encounter (Signed)
Called patient. She has already called her PCP as well. There is a response from Dr Rosanna Randy to add patient onto his schedule tomorrow. Advised patient to reach out to PCP later this afternoon if she does not hear anything.  She verbalized understanding.

## 2019-05-25 NOTE — Telephone Encounter (Signed)
Appt made

## 2019-05-25 NOTE — Telephone Encounter (Signed)
How about 10:40 tomorrow.

## 2019-05-25 NOTE — Telephone Encounter (Signed)
Patient called saying that she has felt very fatigued, nervous, and nauseous for the last 3 days. She describes it as feeling weak and not having any energy. She feels that the nausea could be coming from her being so nervous. She has not been under any increased stress. Denies any changes in diet and has not started any new medications.   Appropriate for the patient to be seen in the office? Please advise. Thanks!

## 2019-05-26 ENCOUNTER — Encounter: Payer: Self-pay | Admitting: Family Medicine

## 2019-05-26 ENCOUNTER — Ambulatory Visit (INDEPENDENT_AMBULATORY_CARE_PROVIDER_SITE_OTHER): Payer: PPO | Admitting: Family Medicine

## 2019-05-26 ENCOUNTER — Other Ambulatory Visit: Payer: Self-pay

## 2019-05-26 VITALS — BP 144/71 | HR 97 | Temp 98.2°F | Resp 16 | Wt 159.4 lb

## 2019-05-26 DIAGNOSIS — R0602 Shortness of breath: Secondary | ICD-10-CM | POA: Diagnosis not present

## 2019-05-26 DIAGNOSIS — C549 Malignant neoplasm of corpus uteri, unspecified: Secondary | ICD-10-CM

## 2019-05-26 DIAGNOSIS — R5383 Other fatigue: Secondary | ICD-10-CM | POA: Diagnosis not present

## 2019-05-26 DIAGNOSIS — E039 Hypothyroidism, unspecified: Secondary | ICD-10-CM

## 2019-05-26 DIAGNOSIS — E1159 Type 2 diabetes mellitus with other circulatory complications: Secondary | ICD-10-CM | POA: Diagnosis not present

## 2019-05-26 DIAGNOSIS — I2510 Atherosclerotic heart disease of native coronary artery without angina pectoris: Secondary | ICD-10-CM

## 2019-05-26 DIAGNOSIS — F411 Generalized anxiety disorder: Secondary | ICD-10-CM

## 2019-05-26 LAB — POCT URINALYSIS DIPSTICK
Bilirubin, UA: NEGATIVE
Blood, UA: NEGATIVE
Glucose, UA: NEGATIVE
Ketones, UA: NEGATIVE
Nitrite, UA: NEGATIVE
Protein, UA: NEGATIVE
Spec Grav, UA: 1.02 (ref 1.010–1.025)
Urobilinogen, UA: 0.2 E.U./dL
pH, UA: 6 (ref 5.0–8.0)

## 2019-05-26 NOTE — Patient Instructions (Signed)
Take Alprazolam 1/2 tablet in the mornings for 1 week.

## 2019-05-26 NOTE — Progress Notes (Signed)
Patient: Jasmine Webster Female    DOB: Jul 11, 1926   85 y.o.   MRN: [id number] Visit Date: 05/26/2019  Today's Provider: [name], MD   Chief Complaint  Patient presents with  . Fatigue   Subjective:     HPI Patient presents in office today with concerns of fatigue and nausea that began suddenly 05/21/19. Patient denies recent travel or eating new food item/product.  Allergies  Allergen Reactions  . Etodolac Nausea Only, Other (See Comments) and Anxiety    Dizziness, too  . Naproxen Other (See Comments) and Anxiety    Gave her an ulcer after taking it for years  . Statins Other (See Comments)    No energy and myalgias  . Latex Rash  . Terbinafine Other (See Comments) and Rash    "Maybe made me nervous"  . Terbinafine Hcl Rash     Current Outpatient Medications:  .  ALPRAZolam (XANAX) 0.5 MG tablet, TAKE 1 TABLET BY MOUTH EVERY DAY AT BEDTIME AS NEEDED, Disp: 30 tablet, Rfl: 2 .  amLODipine (NORVASC) 5 MG tablet, TAKE 1 TABLET BY MOUTH EVERY DAY, Disp: 90 tablet, Rfl: 3 .  Artificial Tear Ointment (DRY EYES OP), Apply 1 drop to eye daily., Disp: , Rfl:  .  aspirin EC 81 MG tablet, Take 81 mg by mouth daily., Disp: , Rfl:  .  gabapentin (NEURONTIN) 100 MG capsule, 200 mg 2 (two) times daily. , Disp: , Rfl:  .  glucose blood (ONE TOUCH ULTRA TEST) test strip, Check sugar twice daily., Disp: 100 each, Rfl: 3 .  glucose blood (ONE TOUCH ULTRA TEST) test strip, CHECK SUGAR TWICE A DAY, Disp: 200 each, Rfl: 3 .  HYDROcodone-acetaminophen (NORCO/VICODIN) 5-325 MG tablet, Take 1 tablet by mouth every 6 (six) hours as needed for moderate pain., Disp: , Rfl:  .  Insulin Glargine (LANTUS SOLOSTAR) 100 UNIT/ML Solostar Pen, INJECT 28 UNITS SUBCUTANEOUSLY AT BEDTIME, Disp: 27 pen, Rfl: 3 .  Insulin Pen Needle (B-D ULTRAFINE III SHORT PEN) 31G X 8 MM MISC, CHECK SUGAR 2 TIMES DAILY, DX E11.9 (NEEDS ULTRA FINE PEN NEEDLES), Disp: 50 each, Rfl: 12 .  Insulin Pen Needle (FIFTY50  PEN NEEDLES) 31G X 8 MM MISC, Check sugar 2 times daily, DX E11.9 (needs ultra fine pen needles), Disp: , Rfl:  .  isosorbide mononitrate (IMDUR) 30 MG 24 hr tablet, Take 1 tablet (30 mg total) by mouth daily., Disp: 30 tablet, Rfl: 3 .  isosorbide mononitrate (IMDUR) 30 MG 24 hr tablet, Take 1 tablet (30 mg total) by mouth daily., Disp: 30 tablet, Rfl: 12 .  Lancets (ONETOUCH ULTRASOFT) lancets, Check sugar twice daily DX E11.9, Disp: 100 each, Rfl: 12 .  Multiple Vitamins-Minerals (PRESERVISION AREDS) CAPS, Take 1 capsule by mouth 2 (two) times daily. , Disp: , Rfl:  .  nitroGLYCERIN (NITROSTAT) 0.4 MG SL tablet, PLACE 1 TABLET (0.4 MG TOTAL) UNDER THE TONGUE EVERY 5 (FIVE) MINUTES AS NEEDED FOR CHEST PAIN., Disp: 75 tablet, Rfl: 1 .  Omega-3 Fatty Acids (FISH OIL PO), Take by mouth daily., Disp: , Rfl:  .  omeprazole (PRILOSEC) 20 MG capsule, TAKE 1 CAPSULE BY MOUTH EVERY DAY, Disp: 30 capsule, Rfl: 3 .  quinapril-hydrochlorothiazide (ACCURETIC) 20-12.5 MG tablet, TAKE 1 TABLET BY MOUTH TWICE A DAY, Disp: 180 tablet, Rfl: 3 .  rosuvastatin (CRESTOR) 5 MG tablet, Take 1 tablet (5 mg total) by mouth daily., Disp: 30 tablet, Rfl: 11  Review of Systems  Constitutional: Positive for appetite change, chills and fatigue. Negative for activity change, diaphoresis, fever and unexpected weight change.  HENT: Positive for postnasal drip, rhinorrhea and tinnitus. Negative for congestion, dental problem, drooling, ear discharge, ear pain, facial swelling, hearing loss, mouth sores, nosebleeds, sinus pressure, sinus pain, sneezing, sore throat, trouble swallowing and voice change.   Respiratory: Positive for cough. Negative for apnea, choking, chest tightness, shortness of breath, wheezing and stridor.   Cardiovascular: Negative.   Gastrointestinal: Positive for constipation and nausea.  Endocrine: Negative.   Genitourinary: Negative.   Musculoskeletal: Positive for back pain. Negative for arthralgias, gait  problem, joint swelling, myalgias, neck pain and neck stiffness.  Allergic/Immunologic: Negative.   Neurological: Positive for dizziness (upon awakening).  Psychiatric/Behavioral: Negative.     Social History   Tobacco Use  . Smoking status: Never Smoker  . Smokeless tobacco: Never Used  Substance Use Topics  . Alcohol use: No    Alcohol/week: 0.0 standard drinks      Objective:   BP (!) 144/71   Pulse 97   Temp 98.2 F (36.8 C) (Oral)   Resp 16   Wt 159 lb 6.4 oz (72.3 kg)   SpO2 95%   BMI 26.94 kg/m  Vitals:   05/26/19 1338  BP: (!) 144/71  Pulse: 97  Resp: 16  Temp: 98.2 F (36.8 C)  TempSrc: Oral  SpO2: 95%  Weight: 159 lb 6.4 oz (72.3 kg)     Physical Exam Vitals signs reviewed.  Constitutional:      Appearance: She is well-developed.  HENT:     Head: Normocephalic and atraumatic.     Right Ear: External ear normal.     Left Ear: External ear normal.     Nose: Nose normal.  Eyes:     Conjunctiva/sclera: Conjunctivae normal.     Pupils: Pupils are equal, round, and reactive to light.  Neck:     Musculoskeletal: Normal range of motion and neck supple.     Thyroid: No thyromegaly.  Cardiovascular:     Rate and Rhythm: Normal rate and regular rhythm.     Heart sounds: Murmur present.  Pulmonary:     Effort: Pulmonary effort is normal.     Breath sounds: Normal breath sounds.  Abdominal:     Palpations: Abdomen is soft.  Musculoskeletal: Normal range of motion.  Skin:    General: Skin is warm and dry.  Neurological:     General: No focal deficit present.     Mental Status: She is alert and oriented to person, place, and time.     Deep Tendon Reflexes: Reflexes are normal and symmetric.  Psychiatric:        Mood and Affect: Mood normal.        Behavior: Behavior normal.        Thought Content: Thought content normal.        Judgment: Judgment normal.   ECG reveal NSR with LBBB--old   No results found for any visits on 05/26/19.      Assessment & Plan    1. Shortness of breath Sounds like anxiety attacks as described by pt. - EKG 12-Lead  2. GAD (generalized anxiety disorder) Add Xanax 1/2 BID for now--RTC 1-3 weeks.  3. Fatigue, unspecified type More than 50% 25 minute visit spent in counseling or coordination of care  - POCT urinalysis dipstick - Urine Culture  4. Cardiovascular disease Risk factors addressed. Also valvular disease.  5. Controlled type 2 diabetes mellitus with  other circulatory complication, unspecified whether long term insulin use ([place])   6. Adult hypothyroidism   7. Malignant neoplasm of corpus uteri ([place]) Remotely.     Richard Cranford Mon, MD  [facility] Fritzi Mandes [place],acting as a scribe for [name], MD.,have documented all relevant documentation on the behalf of [name], MD,as directed by  [name], MD while in the presence of [name], MD.

## 2019-05-28 DIAGNOSIS — M7062 Trochanteric bursitis, left hip: Secondary | ICD-10-CM | POA: Diagnosis not present

## 2019-05-28 LAB — URINE CULTURE

## 2019-06-18 ENCOUNTER — Other Ambulatory Visit: Payer: Self-pay | Admitting: Family Medicine

## 2019-06-18 DIAGNOSIS — F419 Anxiety disorder, unspecified: Secondary | ICD-10-CM

## 2019-06-18 DIAGNOSIS — E118 Type 2 diabetes mellitus with unspecified complications: Secondary | ICD-10-CM

## 2019-06-18 DIAGNOSIS — E119 Type 2 diabetes mellitus without complications: Secondary | ICD-10-CM

## 2019-06-18 NOTE — Telephone Encounter (Signed)
Please review. Insulin was refilled today.

## 2019-06-18 NOTE — Telephone Encounter (Signed)
Pt needing:  Testing Strips - out - needing refills Insulin - haven't received  ALPRAZolam (XANAX) 0.5 MG tablet - needing to be filled  Please call into:  [id number] [name], [place] [phone] (Phone) [phone] (Fax)    Thanks, [name]

## 2019-06-22 MED ORDER — ONETOUCH ULTRA BLUE VI STRP: ORAL_STRIP | 3 refills | Status: DC

## 2019-06-22 MED ORDER — ALPRAZOLAM 0.5 MG PO TABS: ORAL_TABLET | ORAL | 2 refills | Status: DC

## 2019-06-25 ENCOUNTER — Other Ambulatory Visit: Payer: Self-pay | Admitting: Family Medicine

## 2019-06-25 DIAGNOSIS — R079 Chest pain, unspecified: Secondary | ICD-10-CM

## 2019-06-25 DIAGNOSIS — I209 Angina pectoris, unspecified: Secondary | ICD-10-CM

## 2019-07-19 ENCOUNTER — Other Ambulatory Visit: Payer: Self-pay | Admitting: Family Medicine

## 2019-07-19 DIAGNOSIS — I209 Angina pectoris, unspecified: Secondary | ICD-10-CM

## 2019-07-19 DIAGNOSIS — R079 Chest pain, unspecified: Secondary | ICD-10-CM

## 2019-08-12 ENCOUNTER — Other Ambulatory Visit: Payer: Self-pay | Admitting: Family Medicine

## 2019-08-12 DIAGNOSIS — R079 Chest pain, unspecified: Secondary | ICD-10-CM

## 2019-08-12 DIAGNOSIS — I209 Angina pectoris, unspecified: Secondary | ICD-10-CM

## 2019-08-13 ENCOUNTER — Other Ambulatory Visit: Payer: Self-pay | Admitting: Family Medicine

## 2019-08-13 DIAGNOSIS — R079 Chest pain, unspecified: Secondary | ICD-10-CM

## 2019-08-13 DIAGNOSIS — I209 Angina pectoris, unspecified: Secondary | ICD-10-CM

## 2019-09-06 ENCOUNTER — Other Ambulatory Visit: Payer: Self-pay | Admitting: Family Medicine

## 2019-09-06 DIAGNOSIS — R079 Chest pain, unspecified: Secondary | ICD-10-CM

## 2019-09-06 DIAGNOSIS — I209 Angina pectoris, unspecified: Secondary | ICD-10-CM

## 2019-09-08 NOTE — Progress Notes (Signed)
Patient: Jasmine Webster Female    DOB: 05-07-1926   85 y.o.   MRN: [id number] Visit Date: 09/09/2019  Today's Provider: [name], MD   Chief Complaint  Patient presents with  . Diabetes  . Hypertension  . Hyperlipidemia   Subjective:     HPI  Overall the patient has been feeling fairly well.  Energy level has been okay and she has had no falls. Controlled type 2 diabetes mellitus with other circulatory complication, unspecified whether long term insulin use ([place]) From 05/11/2019-no changes. Hemoglobin A1c 7.3  Lab Results  Component Value Date   HGBA1C 7.6 (A) 09/09/2019   HGBA1C 7.3 (H) 05/14/2019   HGBA1C 7.0 (A) 09/15/2018    Essential (primary) hypertension From 05/11/2019-On norvasc. Labs checked, stable.   BP Readings from Last 3 Encounters:  05/26/19 (!) 144/71  05/11/19 124/60  01/12/19 118/62    Hypercholesteremia From 05/11/2019-On Crestor. Labs checked, stable.  Pain of right hip joint From 05/11/2019- Ambulatory referral to Orthopedic Surgery  Cardiovascular disease From 05/11/2019-All risk factors treated.  Angina pectoris ([place]) From 05/11/2019-Stable on Isordil.  Nonrheumatic aortic valve stenosis From 05/11/2019-Per cardiology.   Allergies  Allergen Reactions  . Etodolac Nausea Only, Other (See Comments) and Anxiety    Dizziness, too  . Naproxen Other (See Comments) and Anxiety    Gave her an ulcer after taking it for years  . Statins Other (See Comments)    No energy and myalgias  . Latex Rash  . Terbinafine Other (See Comments) and Rash    "Maybe made me nervous"  . Terbinafine Hcl Rash     Current Outpatient Medications:  .  ALPRAZolam (XANAX) 0.5 MG tablet, TAKE 1 TABLET BY MOUTH EVERY DAY AT BEDTIME AS NEEDED, Disp: 30 tablet, Rfl: 2 .  amLODipine (NORVASC) 5 MG tablet, TAKE 1 TABLET BY MOUTH EVERY DAY, Disp: 90 tablet, Rfl: 3 .  Artificial Tear Ointment (DRY EYES OP), Apply 1 drop to eye daily., Disp: , Rfl:   .  aspirin EC 81 MG tablet, Take 81 mg by mouth daily., Disp: , Rfl:  .  gabapentin (NEURONTIN) 100 MG capsule, 200 mg 2 (two) times daily. , Disp: , Rfl:  .  glucose blood (ONE TOUCH ULTRA TEST) test strip, Check sugar twice daily., Disp: 100 each, Rfl: 3 .  HYDROcodone-acetaminophen (NORCO/VICODIN) 5-325 MG tablet, Take 1 tablet by mouth every 6 (six) hours as needed for moderate pain., Disp: , Rfl:  .  Insulin Glargine (LANTUS SOLOSTAR) 100 UNIT/ML Solostar Pen, INJECT 28 UNITS SUBCUTANEOUSLY AT BEDTIME, Disp: 15 mL, Rfl: 3 .  Insulin Pen Needle (B-D ULTRAFINE III SHORT PEN) 31G X 8 MM MISC, CHECK SUGAR 2 TIMES DAILY, DX E11.9 (NEEDS ULTRA FINE PEN NEEDLES), Disp: 50 each, Rfl: 12 .  Insulin Pen Needle (FIFTY50 PEN NEEDLES) 31G X 8 MM MISC, Check sugar 2 times daily, DX E11.9 (needs ultra fine pen needles), Disp: , Rfl:  .  isosorbide mononitrate (IMDUR) 30 MG 24 hr tablet, Take 1 tablet (30 mg total) by mouth daily., Disp: 30 tablet, Rfl: 12 .  isosorbide mononitrate (IMDUR) 30 MG 24 hr tablet, TAKE 1 TABLET BY MOUTH EVERY DAY, Disp: 90 tablet, Rfl: 1 .  Lancets (ONETOUCH ULTRASOFT) lancets, Check sugar twice daily DX E11.9, Disp: 100 each, Rfl: 12 .  Multiple Vitamins-Minerals (PRESERVISION AREDS) CAPS, Take 1 capsule by mouth 2 (two) times daily. , Disp: , Rfl:  .  nitroGLYCERIN (NITROSTAT) 0.4  MG SL tablet, PLACE 1 TABLET (0.4 MG TOTAL) UNDER THE TONGUE EVERY 5 (FIVE) MINUTES AS NEEDED FOR CHEST PAIN., Disp: 75 tablet, Rfl: 1 .  Omega-3 Fatty Acids (FISH OIL PO), Take by mouth daily., Disp: , Rfl:  .  omeprazole (PRILOSEC) 20 MG capsule, TAKE 1 CAPSULE BY MOUTH EVERY DAY, Disp: 90 capsule, Rfl: 1 .  ONETOUCH ULTRA test strip, CHECK SUGAR TWICE A DAY, Disp: 200 strip, Rfl: 3 .  quinapril-hydrochlorothiazide (ACCURETIC) 20-12.5 MG tablet, TAKE 1 TABLET BY MOUTH TWICE A DAY, Disp: 180 tablet, Rfl: 3 .  rosuvastatin (CRESTOR) 5 MG tablet, Take 1 tablet (5 mg total) by mouth daily., Disp: 30  tablet, Rfl: 11  Review of Systems  Constitutional: Negative.   HENT: Negative.   Eyes: Negative.   Respiratory: Negative.   Cardiovascular: Negative.   Gastrointestinal: Negative.   Genitourinary: Negative.   Musculoskeletal: Positive for arthralgias and back pain.  Allergic/Immunologic: Negative.   Hematological: Negative.   Psychiatric/Behavioral: Negative.     Social History   Tobacco Use  . Smoking status: Never Smoker  . Smokeless tobacco: Never Used  Substance Use Topics  . Alcohol use: No    Alcohol/week: 0.0 standard drinks      Objective:   There were no vitals taken for this visit. There were no vitals filed for this visit.There is no height or weight on file to calculate BMI.   Physical Exam Vitals signs reviewed.  Constitutional:      Appearance: She is well-developed.  HENT:     Head: Normocephalic and atraumatic.     Right Ear: External ear normal.     Left Ear: External ear normal.     Nose: Nose normal.  Eyes:     Conjunctiva/sclera: Conjunctivae normal.     Pupils: Pupils are equal, round, and reactive to light.  Neck:     Musculoskeletal: Normal range of motion and neck supple.     Thyroid: No thyromegaly.  Cardiovascular:     Rate and Rhythm: Normal rate and regular rhythm.     Heart sounds: Murmur present.     Comments: 3/6 systolic murmur at the right upper sternal border Pulmonary:     Effort: Pulmonary effort is normal.     Breath sounds: Normal breath sounds.  Abdominal:     Palpations: Abdomen is soft.  Musculoskeletal: Normal range of motion.  Skin:    General: Skin is warm and dry.  Neurological:     General: No focal deficit present.     Mental Status: She is alert and oriented to person, place, and time.     Deep Tendon Reflexes: Reflexes are normal and symmetric.  Psychiatric:        Mood and Affect: Mood normal.        Behavior: Behavior normal.        Thought Content: Thought content normal.        Judgment: Judgment  normal.      Results for orders placed or performed in visit on 09/09/19  POCT glycosylated hemoglobin (Hb A1C)  Result Value Ref Range   Hemoglobin A1C 7.6 (A) 4.0 - 5.6 %   HbA1c POC (<> result, manual entry)     HbA1c, POC (prediabetic range)     HbA1c, POC (controlled diabetic range)         Assessment & Plan    1. Controlled type 2 diabetes mellitus with other circulatory complication, unspecified whether long term insulin use (H[facility]) Good control  with age 40 with an A1c of 7.6.  No changes.  Return to clinic 4 to 5 months - POCT glycosylated hemoglobin (Hb A1C)  2. Angina pectoris ([place]) Presently asymptomatic.  She is doing well on Imdur 30 mg daily  3. Benign essential HTN Controlled on amlodipine and quinapril HCT  4. Hypercholesteremia Doing well on Crestor 5 mg daily  5. Nonrheumatic aortic valve stenosis Followed by cardiology     [name], MD  [facility]

## 2019-09-09 ENCOUNTER — Ambulatory Visit (INDEPENDENT_AMBULATORY_CARE_PROVIDER_SITE_OTHER): Payer: PPO | Admitting: Family Medicine

## 2019-09-09 ENCOUNTER — Encounter: Payer: Self-pay | Admitting: Family Medicine

## 2019-09-09 ENCOUNTER — Other Ambulatory Visit: Payer: Self-pay

## 2019-09-09 VITALS — BP 128/76 | HR 70 | Temp 97.9°F | Wt 162.0 lb

## 2019-09-09 DIAGNOSIS — I209 Angina pectoris, unspecified: Secondary | ICD-10-CM | POA: Diagnosis not present

## 2019-09-09 DIAGNOSIS — I10 Essential (primary) hypertension: Secondary | ICD-10-CM

## 2019-09-09 DIAGNOSIS — E7800 Pure hypercholesterolemia, unspecified: Secondary | ICD-10-CM

## 2019-09-09 DIAGNOSIS — E1159 Type 2 diabetes mellitus with other circulatory complications: Secondary | ICD-10-CM

## 2019-09-09 DIAGNOSIS — I350 Nonrheumatic aortic (valve) stenosis: Secondary | ICD-10-CM | POA: Diagnosis not present

## 2019-09-09 LAB — POCT GLYCOSYLATED HEMOGLOBIN (HGB A1C): Hemoglobin A1C: 7.6 % — AB (ref 4.0–5.6)

## 2019-09-10 ENCOUNTER — Other Ambulatory Visit: Payer: Self-pay | Admitting: Family Medicine

## 2019-10-07 ENCOUNTER — Other Ambulatory Visit: Payer: Self-pay | Admitting: Family Medicine

## 2019-10-07 DIAGNOSIS — I209 Angina pectoris, unspecified: Secondary | ICD-10-CM

## 2019-10-07 DIAGNOSIS — R079 Chest pain, unspecified: Secondary | ICD-10-CM

## 2019-10-13 ENCOUNTER — Other Ambulatory Visit: Payer: Self-pay

## 2019-11-01 ENCOUNTER — Other Ambulatory Visit: Payer: Self-pay | Admitting: Family Medicine

## 2019-11-04 ENCOUNTER — Other Ambulatory Visit: Payer: Self-pay | Admitting: Family Medicine

## 2019-11-04 DIAGNOSIS — I209 Angina pectoris, unspecified: Secondary | ICD-10-CM

## 2019-11-04 DIAGNOSIS — R079 Chest pain, unspecified: Secondary | ICD-10-CM

## 2019-11-15 DIAGNOSIS — M7062 Trochanteric bursitis, left hip: Secondary | ICD-10-CM | POA: Diagnosis not present

## 2019-11-21 ENCOUNTER — Other Ambulatory Visit: Payer: Self-pay | Admitting: Family Medicine

## 2019-11-24 ENCOUNTER — Other Ambulatory Visit: Payer: Self-pay | Admitting: Family Medicine

## 2019-11-24 DIAGNOSIS — F419 Anxiety disorder, unspecified: Secondary | ICD-10-CM

## 2019-12-14 ENCOUNTER — Other Ambulatory Visit: Payer: Self-pay

## 2019-12-14 ENCOUNTER — Ambulatory Visit: Payer: PPO | Admitting: Podiatry

## 2019-12-14 DIAGNOSIS — M79674 Pain in right toe(s): Secondary | ICD-10-CM | POA: Diagnosis not present

## 2019-12-14 DIAGNOSIS — M79675 Pain in left toe(s): Secondary | ICD-10-CM

## 2019-12-14 DIAGNOSIS — B351 Tinea unguium: Secondary | ICD-10-CM | POA: Diagnosis not present

## 2019-12-16 NOTE — Progress Notes (Signed)
   SUBJECTIVE Patient with a history of diabetes mellitus presents to office today complaining of elongated, thickened nails that cause pain while ambulating in shoes. She is unable to trim her own nails. Patient is here for further evaluation and treatment.   Past Medical History:  Diagnosis Date  . Arthritis   . Cataract    cataract removal bilaterally approx 20 years ago  . Chronic back pain   . Coronary artery disease   . Diabetes mellitus without complication ([place])   . Heart murmur   . Hypertension     OBJECTIVE General Patient is awake, alert, and oriented x 3 and in no acute distress. Derm Skin is dry and supple bilateral. Negative open lesions or macerations. Remaining integument unremarkable. Nails are tender, long, thickened and dystrophic with subungual debris, consistent with onychomycosis, 1-5 bilateral. No signs of infection noted. Vasc  DP and PT pedal pulses palpable bilaterally. Temperature gradient within normal limits.  Neuro Epicritic and protective threshold sensation diminished bilaterally.  Musculoskeletal Exam No symptomatic pedal deformities noted bilateral. Muscular strength within normal limits.  ASSESSMENT 1. Diabetes Mellitus w/ peripheral neuropathy 2. Onychomycosis of nail due to dermatophyte bilateral 3. Pain in foot bilateral  PLAN OF CARE 1. Patient evaluated today. 2. Instructed to maintain good pedal hygiene and foot care. Stressed importance of controlling blood sugar.  3. Mechanical debridement of nails 1-5 bilaterally performed using a nail nipper. Filed with dremel without incident.  4. Return to clinic in 3 mos.     Edrick Kins, DPM [facility]  Dr. Edrick Kins, [place] [address]                                        [place], [place] [address]                Office [phone]  Fax [phone]

## 2019-12-17 ENCOUNTER — Other Ambulatory Visit: Payer: Self-pay | Admitting: Family Medicine

## 2020-01-03 ENCOUNTER — Other Ambulatory Visit: Payer: Self-pay | Admitting: Family Medicine

## 2020-01-03 DIAGNOSIS — R079 Chest pain, unspecified: Secondary | ICD-10-CM

## 2020-01-03 DIAGNOSIS — I209 Angina pectoris, unspecified: Secondary | ICD-10-CM

## 2020-01-07 NOTE — Progress Notes (Signed)
Patient: Jasmine Webster Female    DOB: 19-May-1926   86 y.o.   MRN: [id number] Visit Date: 01/10/2020  Today's Provider: [name], MD   Chief Complaint  Patient presents with  . Follow-up  . Diabetes  . Hypertension  . Hyperlipidemia   Subjective:     HPI  Overall she states she feels well.  She has rare chest pain which is fleeting.  She does feel lightheaded after she has bent over.  She continues to live at home with her family.  Her granddaughter brings her in today.  They are all very supportive of the patient.  Patient is actually an excellent historian at age 44. Controlled type 2 dia betes mellitus with other circulatory complication, unspecified whether long term insulin use ([place]) From 09/09/2019-Good control with age 68 with an A1c of 7.6.  No changes.  Return to clinic 4 to 5 months  Angina pectoris ([place]) From 09/09/2019-Presently asymptomatic.  She is doing well on Imdur 30 mg daily  Benign essential HTN From 09/09/2019-Controlled on amlodipine and quinapril HCT.  Hypercholesteremia From 09/09/2019-Doing well on Crestor 5 mg daily.  Nonrheumatic aortic valve stenosis From 09/09/2019-Followed by cardiology.  Allergies  Allergen Reactions  . Etodolac Nausea Only, Other (See Comments) and Anxiety    Dizziness, too  . Naproxen Other (See Comments) and Anxiety    Gave her an ulcer after taking it for years  . Statins Other (See Comments)    No energy and myalgias  . Latex Rash  . Terbinafine Other (See Comments) and Rash    "Maybe made me nervous"  . Terbinafine Hcl Rash     Current Outpatient Medications:  .  ALPRAZolam (XANAX) 0.5 MG tablet, TAKE 1 TABLET BY MOUTH EVERY DAY AT BEDTIME AS NEEDED, Disp: 30 tablet, Rfl: 2 .  amLODipine (NORVASC) 5 MG tablet, TAKE 1 TABLET BY MOUTH EVERY DAY, Disp: 90 tablet, Rfl: 3 .  Artificial Tear Ointment (DRY EYES OP), Apply 1 drop to eye daily., Disp: , Rfl:  .  aspirin EC 81 MG tablet, Take 81 mg  by mouth daily., Disp: , Rfl:  .  glucose blood (ONE TOUCH ULTRA TEST) test strip, Check sugar twice daily., Disp: 100 each, Rfl: 3 .  HYDROcodone-acetaminophen (NORCO/VICODIN) 5-325 MG tablet, Take 1 tablet by mouth every 6 (six) hours as needed for moderate pain., Disp: , Rfl:  .  Insulin Pen Needle (B-D ULTRAFINE III SHORT PEN) 31G X 8 MM MISC, CHECK SUGAR 2 TIMES DAILY, DX E11.9 (NEEDS ULTRA FINE PEN NEEDLES), Disp: 100 each, Rfl: 12 .  Insulin Pen Needle (FIFTY50 PEN NEEDLES) 31G X 8 MM MISC, Check sugar 2 times daily, DX E11.9 (needs ultra fine pen needles), Disp: , Rfl:  .  isosorbide mononitrate (IMDUR) 30 MG 24 hr tablet, Take 1 tablet (30 mg total) by mouth daily., Disp: 30 tablet, Rfl: 12 .  Lancets (ONETOUCH ULTRASOFT) lancets, Check sugar twice daily DX E11.9, Disp: 100 each, Rfl: 12 .  LANTUS SOLOSTAR 100 UNIT/ML Solostar Pen, INJECT 28 UNITS SUBCUTANEOUSLY AT BEDTIME, Disp: 15 mL, Rfl: 5 .  Multiple Vitamins-Minerals (PRESERVISION AREDS) CAPS, Take 1 capsule by mouth 2 (two) times daily. , Disp: , Rfl:  .  nitroGLYCERIN (NITROSTAT) 0.4 MG SL tablet, PLACE 1 TABLET (0.4 MG TOTAL) UNDER THE TONGUE EVERY 5 (FIVE) MINUTES AS NEEDED FOR CHEST PAIN., Disp: 75 tablet, Rfl: 1 .  Omega-3 Fatty Acids (FISH OIL PO), Take by mouth  daily., Disp: , Rfl:  .  omeprazole (PRILOSEC) 20 MG capsule, TAKE 1 CAPSULE BY MOUTH EVERY DAY, Disp: 90 capsule, Rfl: 1 .  ONETOUCH ULTRA test strip, CHECK SUGAR TWICE A DAY, Disp: 200 strip, Rfl: 3 .  quinapril-hydrochlorothiazide (ACCURETIC) 20-12.5 MG tablet, TAKE 1 TABLET BY MOUTH TWICE A DAY, Disp: 180 tablet, Rfl: 3 .  rosuvastatin (CRESTOR) 5 MG tablet, TAKE 1 TABLET BY MOUTH EVERY DAY, Disp: 90 tablet, Rfl: 1 .  gabapentin (NEURONTIN) 100 MG capsule, 200 mg 2 (two) times daily. , Disp: , Rfl:  .  isosorbide mononitrate (IMDUR) 30 MG 24 hr tablet, TAKE 1 TABLET BY MOUTH EVERY DAY (Patient not taking: Reported on 01/10/2020), Disp: 90 tablet, Rfl: 1  Review of  Systems  Constitutional: Negative for appetite change, chills, fatigue and fever.  Respiratory: Negative for chest tightness and shortness of breath.   Cardiovascular: Negative for chest pain and palpitations.  Gastrointestinal: Negative for abdominal pain, nausea and vomiting.  Endocrine: Negative.   Musculoskeletal: Positive for back pain.  Neurological: Positive for light-headedness. Negative for dizziness and weakness.  Hematological: Negative.   Psychiatric/Behavioral: Negative.     Social History   Tobacco Use  . Smoking status: Never Smoker  . Smokeless tobacco: Never Used  Substance Use Topics  . Alcohol use: No    Alcohol/week: 0.0 standard drinks      Objective:   BP 94/61 (BP Location: Right Arm, Patient Position: Sitting, Cuff Size: Large)   Pulse 65   Temp (!) 96 F (35.6 C) (Other (Comment))   Resp 18   Ht 5\' 5"  (1.651 m)   Wt 163 lb (73.9 kg)   SpO2 96%   BMI 27.12 kg/m  Vitals:   01/10/20 1616  BP: 94/61  Pulse: 65  Resp: 18  Temp: (!) 96 F (35.6 C)  TempSrc: Other (Comment)  SpO2: 96%  Weight: 163 lb (73.9 kg)  Height: 5\' 5"  (1.651 m)  Body mass index is 27.12 kg/m.   Physical Exam Vitals reviewed.  Constitutional:      Appearance: She is well-developed.  HENT:     Head: Normocephalic and atraumatic.     Right Ear: External ear normal.     Left Ear: External ear normal.     Nose: Nose normal.  Eyes:     Conjunctiva/sclera: Conjunctivae normal.     Pupils: Pupils are equal, round, and reactive to light.  Neck:     Thyroid: No thyromegaly.  Cardiovascular:     Rate and Rhythm: Normal rate and regular rhythm.     Heart sounds: Murmur present.     Comments: 3/6 Murmur at RUSB. Pulmonary:     Effort: Pulmonary effort is normal.     Breath sounds: Normal breath sounds.  Abdominal:     Palpations: Abdomen is soft.  Musculoskeletal:        General: Normal range of motion.     Cervical back: Normal range of motion and neck supple.    Skin:    General: Skin is warm and dry.  Neurological:     General: No focal deficit present.     Mental Status: She is alert and oriented to person, place, and time.     Deep Tendon Reflexes: Reflexes are normal and symmetric.  Psychiatric:        Mood and Affect: Mood normal.        Behavior: Behavior normal.        Thought Content: Thought content normal.  Judgment: Judgment normal.      No results found for any visits on 01/10/20.     Assessment & Plan    1. Controlled type 2 diabetes mellitus with other circulatory complication, unspecified whether long term insulin use ([facility]) Good control.  No signs symptoms or hypoglycemic episodes - POCT glycosylated hemoglobin (Hb A1C) 7.0 today.  2. Cardiovascular disease Referred back to Dr. Rockey Situ.  - Ambulatory referral to Cardiology  3. Benign essential HTN/relatively hypotensive recently. Stop amlodipine 5 mg daily, due to low blood pressure readings.  Follow-up 1 month. 4. Adult hypothyroidism   5. L-S radiculopathy   6. Nonrheumatic aortic valve stenosis Refer back to cardiology for evaluation of significant aortic stenosis.   Follow up in one month.    I,Oak Dorey,acting as a scribe for [name], MD.,have documented all relevant documentation on the behalf of [name], MD,as directed by  [name], MD while in the presence of [name], MD.      [name], MD  [facility]

## 2020-01-10 ENCOUNTER — Other Ambulatory Visit: Payer: Self-pay

## 2020-01-10 ENCOUNTER — Ambulatory Visit (INDEPENDENT_AMBULATORY_CARE_PROVIDER_SITE_OTHER): Payer: PPO | Admitting: Family Medicine

## 2020-01-10 ENCOUNTER — Encounter: Payer: Self-pay | Admitting: Family Medicine

## 2020-01-10 VITALS — BP 94/61 | HR 65 | Temp 96.0°F | Resp 18 | Ht 65.0 in | Wt 163.0 lb

## 2020-01-10 DIAGNOSIS — E039 Hypothyroidism, unspecified: Secondary | ICD-10-CM | POA: Diagnosis not present

## 2020-01-10 DIAGNOSIS — M5417 Radiculopathy, lumbosacral region: Secondary | ICD-10-CM | POA: Diagnosis not present

## 2020-01-10 DIAGNOSIS — E1159 Type 2 diabetes mellitus with other circulatory complications: Secondary | ICD-10-CM

## 2020-01-10 DIAGNOSIS — I350 Nonrheumatic aortic (valve) stenosis: Secondary | ICD-10-CM

## 2020-01-10 DIAGNOSIS — I2510 Atherosclerotic heart disease of native coronary artery without angina pectoris: Secondary | ICD-10-CM | POA: Diagnosis not present

## 2020-01-10 DIAGNOSIS — I10 Essential (primary) hypertension: Secondary | ICD-10-CM | POA: Diagnosis not present

## 2020-01-10 LAB — POCT GLYCOSYLATED HEMOGLOBIN (HGB A1C)
Est. average glucose Bld gHb Est-mCnc: 154
Hemoglobin A1C: 7 % — AB (ref 4.0–5.6)

## 2020-01-10 NOTE — Patient Instructions (Addendum)
Stop amlodipine 5 mg daily, due to low blood pressure readings. Follow up in one month.

## 2020-01-14 ENCOUNTER — Encounter: Payer: Self-pay | Admitting: Family

## 2020-01-14 ENCOUNTER — Other Ambulatory Visit: Payer: Self-pay

## 2020-01-14 ENCOUNTER — Ambulatory Visit: Payer: PPO | Admitting: Family

## 2020-01-14 VITALS — BP 120/60 | HR 83 | Ht 64.0 in | Wt 163.0 lb

## 2020-01-14 DIAGNOSIS — I350 Nonrheumatic aortic (valve) stenosis: Secondary | ICD-10-CM

## 2020-01-14 NOTE — Patient Instructions (Addendum)
Medication Instructions:  No medication changes.  *If you need a refill on your cardiac medications before your next appointment, please call your pharmacy*  Lab Work: No lab work today.  If you have labs (blood work) drawn today and your tests are completely normal, you will receive your results only by: Marland Kitchen MyChart Message (if you have MyChart) OR . A paper copy in the mail If you have any lab test that is abnormal or we need to change your treatment, we will call you to review the results.   Testing/Procedures: Your physician has requested that you have an echocardiogram. Echocardiography is a painless test that uses sound waves to create images of your heart. It provides your doctor with information about the size and shape of your heart and how well your heart's chambers and valves are working. This procedure takes approximately one hour. There are no restrictions for this procedure. Echo  Please return to [facility] on ______________ at _______________ AM/PM for an Echocardiogram. Your physician has requested that you have an echocardiogram. Echocardiography is a painless test that uses sound waves to create images of your heart. It provides your doctor with information about the size and shape of your heart and how well your heart's chambers and valves are working. This procedure takes approximately one hour. There are no restrictions for this procedure. Please note; depending on visual quality an IV may need to be placed.    Follow-Up: At [facility], you and your health needs are our priority.  As part of our continuing mission to provide you with exceptional heart care, we have created designated Provider Care Teams.  These Care Teams include your primary Cardiologist (physician) and Advanced Practice Providers (APPs -  Physician Assistants and Nurse Practitioners) who all work together to provide you with the care you need, when you need it.  We recommend signing up  for the patient portal called "MyChart".  Sign up information is provided on this After Visit Summary.  MyChart is used to connect with patients for Virtual Visits (Telemedicine).  Patients are able to view lab/test results, encounter notes, upcoming appointments, etc.  Non-urgent messages can be sent to your provider as well.   To learn more about what you can do with MyChart, go to [email or web address].    Your next appointment:   1 month(s)  The format for your next appointment:   In Person  Provider:    You may see Ida Rogue, MD or one of the following Advanced Practice Providers on your designated Care Team:    Murray Hodgkins, NP  Christell Faith, PA-C  Marrianne Mood, PA-C    Other Instructions   Aortic Valve Stenosis  Aortic valve stenosis is a narrowing of the aortic valve in the heart. The aortic valve opens and closes to regulate blood flow between the left side of the heart (left ventricle) and the artery that leads away from the heart (aorta). When the aortic valve becomes narrow, it is difficult for the heart to pump blood out to the body, which causes the heart to work harder. The extra work can weaken the heart muscle over time. Aortic valve stenosis can range from mild to severe. If it is not treated, it can become more severe over time and lead to heart failure. What are the causes? This condition may be caused by:  Buildup of calcium around and on the aortic valve. This can occur with aging. This is the most common cause of aortic  valve stenosis.  A heart problem that developed in the womb (birth defect).  Rheumatic fever.  Radiation to the chest. What increases the risk? You may be more likely to develop this condition if:  You are older than age 56.  You were born with an abnormal bicuspid valve. What are the signs or symptoms? You may not have any symptoms until your condition becomes severe. It may take 10-20 years for mild or moderate aortic  valve stenosis to become severe. Symptoms may include:  Shortness of breath. This may get worse during physical activity.  Feeling unusually weak and tired (fatigue).  Extreme discomfort in the chest, neck, or arm during physical activity (angina).  A heartbeat that is irregular or faster than normal (palpitations).  Dizziness or fainting. This may happen when you get physically tired or after you take certain heart medicines, such as nitroglycerin. How is this diagnosed? This condition may be diagnosed with:  A physical exam.  Echocardiogram. This is a type of imaging test that uses sound waves (ultrasound) to make images of your heart. There are two kinds of this test that may be used. ? Transthoracic echocardiogram (TTE). For this type, a wand-like tool (transducer) is moved over your chest to create ultrasound images that are recorded by a computer. ? Transesophageal echocardiogram (TEE). For this type, a flexible tube (probe) is inserted down the part of the body that moves food from your mouth to your stomach (esophagus). The heart and the esophagus are close to each other. Your health care provider will use the probe to take clear, detailed pictures of the heart.  Cardiac catheterization. For this procedure, a small, thin tube (catheter) is passed through a large vein in your neck, groin, or arm. The catheter is used to get information about arteries, structures, blood pressure, and oxygen levels in your heart.  Stress tests. These are tests that evaluate the blood supply to your heart and your heart's response to exercise. You may work with a health care provider who specializes in the heart (cardiologist) for diagnosis and treatment. How is this treated? Treatment depends on how severe your condition is and what your symptoms are. You will need to have your heart checked regularly to make sure that your condition is not getting worse or causing serious problems. Treatment may also  include:  Surgery to replace your aortic valve. This is the most common treatment for aortic valve stenosis, and it is the only treatment to cure the condition. Several types of surgeries are available. The surgery may be done: ? Through a large incision over your heart (open-heart surgery). ? Through small incisions, using a flexible tube called a catheter (transcatheter aortic valve replacement, TAVR).  Medicines that help to keep your heart rate regular.  Medicines that thin your blood (anticoagulants) to prevent blood clots.  Antibiotic medicines to help prevent infection. If your condition is mild, you may only need regular follow-up visits for monitoring. Follow these instructions at home: Lifestyle  Limit alcohol intake to no more than 1 drink a day for nonpregnant women and 2 drinks a day for men. One drink equals 12 oz of beer, 5 oz of wine, or 1 oz of hard liquor.  Do not use any products that contain nicotine or tobacco, such as cigarettes and e-cigarettes. If you need help quitting, ask your health care provider.  Work with your health care provider to manage your blood pressure and cholesterol.  Maintain a healthy weight. Eating and drinking  Eat a heart-healthy diet that includes plenty of fresh fruits and vegetables, whole grains, lean protein, and low-fat or nonfat dairy.  Limit how much caffeine you drink. Caffeine can affect your heart's rate and rhythm.  Avoid foods that are: ? High in salt (sodium), saturated fat, or sugar. ? Canned or highly processed. ? Fried.  Follow instructions from your health care provider about any other eating or drinking restrictions. Activity  Exercise regularly and return to your normal activities as told by your health care provider. Ask your health care provider what amount and type of physical activity is safe for you. ? If your aortic valve stenosis is mild, you may only need to avoid very intense physical activity, such as  heavy weight lifting. ? The more severe your aortic valve stenosis is, the more activities you may need to avoid. If you are taking blood thinners:  Before you take any medicines that contain aspirin or NSAIDs, talk with your health care provider. These medicines increase your risk for dangerous bleeding.  Take your medicine exactly as told, at the same time every day.  Avoid activities that could cause injury or bruising, and follow instructions about how to prevent falls.  Wear a medical alert bracelet or carry a card that lists what medicines you take. General instructions  Take over-the-counter and prescription medicines only as told by your health care provider.  If you were prescribed an antibiotic, take it as told by your health care provider. Do not stop taking the antibiotic even if you start to feel better.  If you are a woman and you plan to become pregnant, talk with your health care provider before you become pregnant.  Before you have any type of medical or dental procedure or surgery, tell all health care providers that you have aortic valve stenosis. This may affect the treatment that you receive.  Keep all follow-up visits as told by your health care provider. This is important. Contact a health care provider if:  You have a fever. Get help right away if:  You develop any of the following symptoms: ? Chest pain. ? Chest tightness. ? Shortness of breath. ? Trouble breathing.  You feel light-headed.  You feel like you might faint.  Your heartbeat is irregular or faster than normal. These symptoms may represent a serious problem that is an emergency. Do not wait to see if the symptoms will go away. Get medical help right away. Call your local emergency services (911 in the [place] Do not drive yourself to the hospital. Summary  Aortic valve stenosis is a narrowing of the aortic valve in the heart. The aortic valve opens and closes to regulate blood flow between  the left side of the heart (left ventricle) and the artery that leads away from the heart (aorta).  Aortic valve stenosis can range from mild to severe. If it is not treated, it can become more severe over time and lead to heart failure.  Treatment depends on how severe your condition is and what your symptoms are. You will need to have your heart checked regularly to make sure that your condition is not getting worse or causing serious problems.  Exercise regularly and return to your normal activities as told by your health care provider. Ask your health care provider what amount and type of physical activity is safe for you. This information is not intended to replace advice given to you by your health care provider. Make sure you discuss any questions  you have with your health care provider. Document Revised: 10/17/2017 Document Reviewed: 08/07/2017 Elsevier Patient Education  2020 [employer].

## 2020-01-14 NOTE — Progress Notes (Signed)
Office Visit    Patient Name: Jasmine Webster Date of Encounter: 01/14/2020  Primary Care Provider:  Jerrol Banana., MD Primary Cardiologist:  Ida Rogue, MD Electrophysiologist:  None   Chief Complaint    Jasmine Webster is a 86 y.o. female with a hx of moderate aortic stenosis, DM2, GERD, HLD, HTN, arthritis presents today for follow-up of aortic stenosis.  Past Medical History    Past Medical History:  Diagnosis Date  . Arthritis   . Cataract    cataract removal bilaterally approx 20 years ago  . Chronic back pain   . Coronary artery disease   . Diabetes mellitus without complication ([place])   . Heart murmur   . Hypertension    Past Surgical History:  Procedure Laterality Date  . ABDOMINAL HYSTERECTOMY    . APPENDECTOMY    . BACK SURGERY     three  . CARDIAC CATHETERIZATION     6+yrs no stents Dr. Verneda Skill  . CATARACT EXTRACTION    . THORACOLUMBAR SYRINGO SHUNT    . TONSILLECTOMY AND ADENOIDECTOMY      Allergies  Allergies  Allergen Reactions  . Etodolac Nausea Only, Other (See Comments) and Anxiety    Dizziness, too  . Naproxen Other (See Comments) and Anxiety    Gave her an ulcer after taking it for years  . Statins Other (See Comments)    No energy and myalgias  . Latex Rash  . Terbinafine Other (See Comments) and Rash    "Maybe made me nervous"  . Terbinafine Hcl Rash    History of Present Illness    Jasmine Webster is a 86 y.o. female with a hx of moderate aortic stenosis, DM2, GERD, HLD, HTN, arthritis last seen by Dr. Arta Bruce 03/08/2019.  Echo 01/2019 with LVEF 60-65%, mild LVH, diastolic dysfunction, RV normal size and function with severely elevated RVSP 74mmHg, mid-moderate TR, moderate aortic stenosis with mean gradient 14mmHg.   Her Amlodipine was stopped 01/10/20 due to hypotension by her primary care provider.   Intermittent episodes of L sided chest pain with activity. Tells me this is more notable over the last 6  months. It is associated with some shortness of breath. No diaphoresis. Relieved by rest.   She only noticed lightheadedness with bending over. No near-syncope nor syncope. Her chest also gets "uncomfortable" with bending over but "not painful".    No orthopnea, PND. Reports her LLE is always "a little swollen" since an injury 5 years ago but no change compared to baseline.  EKGs/Labs/Other Studies Reviewed:   The following studies were reviewed today:  Echo 01/2019 1. The left ventricle has normal systolic function with an ejection  fraction of 60-65%. The cavity size was normal. There is mildly increased  left ventricular wall thickness. Left ventricular diastolic Doppler  parameters are consistent with impaired  relaxation.   2. The right ventricle has normal systolic function. The cavity was  normal. There is no increase in right ventricular wall thickness. Right  ventricular systolic pressure is severely elevated with an estimated  pressure of 62.0 mmHg.   3. Tricuspid valve regurgitation is mild-moderate.   4. Mmoderate stenosis of the aortic valve. Mean gradient 26 mm Hg    EKG:  EKG is ordered today.  The ekg ordered today demonstrates SR 83 bpm with LBBB and left axis deviation.   Recent Labs: 05/14/2019: ALT 20; BUN 23; Creatinine, Ser 1.05; Hemoglobin 12.9; Platelets CANCELED; Potassium 4.0; Sodium 137; TSH  4.140   Recent Lipid Panel    Component Value Date/Time   CHOL 171 05/14/2019 0811   TRIG 217 (H) 05/14/2019 0811   HDL 45 05/14/2019 0811   CHOLHDL 3.8 05/14/2019 0811   LDLCALC 83 05/14/2019 0811    Home Medications   Current Meds  Medication Sig  . ALPRAZolam (XANAX) 0.5 MG tablet TAKE 1 TABLET BY MOUTH EVERY DAY AT BEDTIME AS NEEDED  . Artificial Tear Ointment (DRY EYES OP) Apply 1 drop to eye daily.  Marland Kitchen aspirin EC 81 MG tablet Take 81 mg by mouth daily.  Marland Kitchen gabapentin (NEURONTIN) 100 MG capsule 200 mg 2 (two) times daily.   Marland Kitchen glucose blood (ONE TOUCH  ULTRA TEST) test strip Check sugar twice daily.  Marland Kitchen HYDROcodone-acetaminophen (NORCO/VICODIN) 5-325 MG tablet Take 1 tablet by mouth every 6 (six) hours as needed for moderate pain.  . Insulin Pen Needle (B-D ULTRAFINE III SHORT PEN) 31G X 8 MM MISC CHECK SUGAR 2 TIMES DAILY, DX E11.9 (NEEDS ULTRA FINE PEN NEEDLES)  . Insulin Pen Needle (FIFTY50 PEN NEEDLES) 31G X 8 MM MISC Check sugar 2 times daily, DX E11.9 (needs ultra fine pen needles)  . isosorbide mononitrate (IMDUR) 30 MG 24 hr tablet Take 1 tablet (30 mg total) by mouth daily.  . Lancets (ONETOUCH ULTRASOFT) lancets Check sugar twice daily DX E11.9  . LANTUS SOLOSTAR 100 UNIT/ML Solostar Pen INJECT 28 UNITS SUBCUTANEOUSLY AT BEDTIME  . Multiple Vitamins-Minerals (PRESERVISION AREDS) CAPS Take 1 capsule by mouth 2 (two) times daily.   . nitroGLYCERIN (NITROSTAT) 0.4 MG SL tablet PLACE 1 TABLET (0.4 MG TOTAL) UNDER THE TONGUE EVERY 5 (FIVE) MINUTES AS NEEDED FOR CHEST PAIN.  Marland Kitchen Omega-3 Fatty Acids (FISH OIL PO) Take by mouth daily.  Marland Kitchen omeprazole (PRILOSEC) 20 MG capsule TAKE 1 CAPSULE BY MOUTH EVERY DAY  . ONETOUCH ULTRA test strip CHECK SUGAR TWICE A DAY  . quinapril-hydrochlorothiazide (ACCURETIC) 20-12.5 MG tablet TAKE 1 TABLET BY MOUTH TWICE A DAY  . rosuvastatin (CRESTOR) 5 MG tablet TAKE 1 TABLET BY MOUTH EVERY DAY      Review of Systems       Review of Systems  Constitution: Negative for chills, fever and malaise/fatigue.  Cardiovascular: Positive for chest pain and dyspnea on exertion. Negative for leg swelling, near-syncope, orthopnea, palpitations and syncope.  Respiratory: Negative for cough, shortness of breath and wheezing.   Gastrointestinal: Negative for nausea and vomiting.  Neurological: Positive for light-headedness. Negative for dizziness and weakness.   All other systems reviewed and are otherwise negative except as noted above.  Physical Exam    VS:  BP 120/60 (BP Location: Left Arm, Patient Position: Sitting,  Cuff Size: Normal)   Pulse 83   Ht 5\' 4"  (1.626 m)   Wt 163 lb (73.9 kg)   SpO2 98%   BMI 27.98 kg/m  , BMI Body mass index is 27.98 kg/m. GEN: Well nourished, well developed, in no acute distress. HEENT: normal. Neck: Supple, no JVD, carotid bruits, or masses. Cardiac: RRR, no rubs, or gallops. Gr 3/6 systolic murmur. No clubbing, cyanosis, edema.  Radials/PT 2+ and equal bilaterally.  Respiratory:  Respirations regular and unlabored, clear to auscultation bilaterally. GI: Soft, nontender, nondistended, BS + x 4. MS: No deformity or atrophy. Skin: Warm and dry, no rash. Neuro:  Strength and sensation are intact. Psych: Normal affect.  Accessory Clinical Findings    ECG personally reviewed by me today - SR 83 bpm with left axis deviation and  LBBB - no acute changes.  Assessment & Plan    1. Aortic stenosis - Moderate by echo 01/2019. Symptomatic with chest pain, shortness of breath. Will plan to update echocardiogram. Not volume overloaded on exam today. If aortic stenosis is unchanged, may need to consider alternative etiology for chest pain and increase Imdur dose if BP will allow and/or Lexiscan.   2. Pulmonary hypertension - Euvolemic on exam. Elevated RVSP on echo 01/2019. Update echocardiogram, as above. May potentially benefit from low dose diuretic.   3. HLD - Follows with PCP. Continue Crestor 5mg  and fish oil.   4. HTN - Amlodipine recently reduced by PCP for low BP <100. BP appropriate today.  Continue Imdur 30mg  daily and Quinapril-HCTZ 20-12.5mg  daily.   5. DM2 - Follows with PCP. Recent A1c of 7.   Disposition: Follow up in 1 month(s) with Dr. Rockey Situ or APP.    Loel Dubonnet, NP 01/14/2020, 4:25 PM

## 2020-02-07 ENCOUNTER — Encounter: Payer: Self-pay | Admitting: Family Medicine

## 2020-02-07 ENCOUNTER — Ambulatory Visit (INDEPENDENT_AMBULATORY_CARE_PROVIDER_SITE_OTHER): Payer: PPO | Admitting: Family Medicine

## 2020-02-07 ENCOUNTER — Other Ambulatory Visit: Payer: Self-pay

## 2020-02-07 VITALS — BP 106/60 | HR 73 | Temp 96.9°F | Wt 164.0 lb

## 2020-02-07 DIAGNOSIS — I2510 Atherosclerotic heart disease of native coronary artery without angina pectoris: Secondary | ICD-10-CM | POA: Diagnosis not present

## 2020-02-07 DIAGNOSIS — I209 Angina pectoris, unspecified: Secondary | ICD-10-CM

## 2020-02-07 DIAGNOSIS — E782 Mixed hyperlipidemia: Secondary | ICD-10-CM

## 2020-02-07 DIAGNOSIS — E039 Hypothyroidism, unspecified: Secondary | ICD-10-CM | POA: Diagnosis not present

## 2020-02-07 DIAGNOSIS — E1159 Type 2 diabetes mellitus with other circulatory complications: Secondary | ICD-10-CM | POA: Diagnosis not present

## 2020-02-07 DIAGNOSIS — I350 Nonrheumatic aortic (valve) stenosis: Secondary | ICD-10-CM | POA: Diagnosis not present

## 2020-02-07 DIAGNOSIS — I10 Essential (primary) hypertension: Secondary | ICD-10-CM | POA: Diagnosis not present

## 2020-02-07 NOTE — Patient Instructions (Signed)
Discontinue evening dose of Quinapril/Hct!!!!

## 2020-02-07 NOTE — Progress Notes (Signed)
Patient: Jasmine Webster Female    DOB: 1926/09/26   86 y.o.   MRN: [id number] Visit Date: 02/07/2020  Today's Provider: [name], MD   Chief Complaint  Patient presents with  . Hypotension   Subjective:     HPI  Patient is feeling well lately.  She still gets a little lightheaded if she bends over at the waist but otherwise has no complaints.  Her granddaughter agrees that she is doing well. Benign essential HTN/relatively hypotensive recently. From 01/10/2020-Stopped amlodipine 5 mg daily, due to low blood pressure readings.  Follow-up 1 month.  Allergies  Allergen Reactions  . Etodolac Nausea Only, Other (See Comments) and Anxiety    Dizziness, too  . Naproxen Other (See Comments) and Anxiety    Gave her an ulcer after taking it for years  . Statins Other (See Comments)    No energy and myalgias  . Latex Rash  . Terbinafine Other (See Comments) and Rash    "Maybe made me nervous"  . Terbinafine Hcl Rash     Current Outpatient Medications:  .  ALPRAZolam (XANAX) 0.5 MG tablet, TAKE 1 TABLET BY MOUTH EVERY DAY AT BEDTIME AS NEEDED, Disp: 30 tablet, Rfl: 2 .  Artificial Tear Ointment (DRY EYES OP), Apply 1 drop to eye daily., Disp: , Rfl:  .  aspirin EC 81 MG tablet, Take 81 mg by mouth daily., Disp: , Rfl:  .  gabapentin (NEURONTIN) 100 MG capsule, 200 mg 2 (two) times daily. , Disp: , Rfl:  .  glucose blood (ONE TOUCH ULTRA TEST) test strip, Check sugar twice daily., Disp: 100 each, Rfl: 3 .  HYDROcodone-acetaminophen (NORCO/VICODIN) 5-325 MG tablet, Take 1 tablet by mouth every 6 (six) hours as needed for moderate pain., Disp: , Rfl:  .  Insulin Pen Needle (B-D ULTRAFINE III SHORT PEN) 31G X 8 MM MISC, CHECK SUGAR 2 TIMES DAILY, DX E11.9 (NEEDS ULTRA FINE PEN NEEDLES), Disp: 100 each, Rfl: 12 .  Insulin Pen Needle (FIFTY50 PEN NEEDLES) 31G X 8 MM MISC, Check sugar 2 times daily, DX E11.9 (needs ultra fine pen needles), Disp: , Rfl:  .  isosorbide  mononitrate (IMDUR) 30 MG 24 hr tablet, Take 1 tablet (30 mg total) by mouth daily., Disp: 30 tablet, Rfl: 12 .  Lancets (ONETOUCH ULTRASOFT) lancets, Check sugar twice daily DX E11.9, Disp: 100 each, Rfl: 12 .  LANTUS SOLOSTAR 100 UNIT/ML Solostar Pen, INJECT 28 UNITS SUBCUTANEOUSLY AT BEDTIME, Disp: 15 mL, Rfl: 5 .  Multiple Vitamins-Minerals (PRESERVISION AREDS) CAPS, Take 1 capsule by mouth 2 (two) times daily. , Disp: , Rfl:  .  nitroGLYCERIN (NITROSTAT) 0.4 MG SL tablet, PLACE 1 TABLET (0.4 MG TOTAL) UNDER THE TONGUE EVERY 5 (FIVE) MINUTES AS NEEDED FOR CHEST PAIN., Disp: 75 tablet, Rfl: 1 .  Omega-3 Fatty Acids (FISH OIL PO), Take by mouth daily., Disp: , Rfl:  .  omeprazole (PRILOSEC) 20 MG capsule, TAKE 1 CAPSULE BY MOUTH EVERY DAY, Disp: 90 capsule, Rfl: 1 .  ONETOUCH ULTRA test strip, CHECK SUGAR TWICE A DAY, Disp: 200 strip, Rfl: 3 .  quinapril-hydrochlorothiazide (ACCURETIC) 20-12.5 MG tablet, TAKE 1 TABLET BY MOUTH TWICE A DAY, Disp: 180 tablet, Rfl: 3 .  rosuvastatin (CRESTOR) 5 MG tablet, TAKE 1 TABLET BY MOUTH EVERY DAY, Disp: 90 tablet, Rfl: 1  Review of Systems  Constitutional: Negative for appetite change, chills, fatigue and fever.  HENT: Negative.   Eyes: Negative.   Respiratory:  Negative for chest tightness and shortness of breath.   Cardiovascular: Negative for chest pain and palpitations.  Gastrointestinal: Negative for abdominal pain, nausea and vomiting.  Endocrine: Negative.   Allergic/Immunologic: Negative.   Neurological: Negative for dizziness and weakness.  Psychiatric/Behavioral: Negative.     Social History   Tobacco Use  . Smoking status: Never Smoker  . Smokeless tobacco: Never Used  Substance Use Topics  . Alcohol use: No    Alcohol/week: 0.0 standard drinks      Objective:   There were no vitals taken for this visit. There were no vitals filed for this visit.There is no height or weight on file to calculate BMI.   Physical Exam Vitals  reviewed.  Constitutional:      Appearance: She is well-developed.  HENT:     Head: Normocephalic and atraumatic.     Right Ear: External ear normal.     Left Ear: External ear normal.     Nose: Nose normal.  Eyes:     Conjunctiva/sclera: Conjunctivae normal.     Pupils: Pupils are equal, round, and reactive to light.  Neck:     Thyroid: No thyromegaly.  Cardiovascular:     Rate and Rhythm: Normal rate and regular rhythm.     Heart sounds: Murmur present.     Comments: 3/6 Murmur at RUSB. Pulmonary:     Effort: Pulmonary effort is normal.     Breath sounds: Normal breath sounds.  Abdominal:     Palpations: Abdomen is soft.  Musculoskeletal:        General: Normal range of motion.     Cervical back: Normal range of motion and neck supple.  Skin:    General: Skin is warm and dry.  Neurological:     General: No focal deficit present.     Mental Status: She is alert and oriented to person, place, and time.     Deep Tendon Reflexes: Reflexes are normal and symmetric.  Psychiatric:        Mood and Affect: Mood normal.        Behavior: Behavior normal.        Thought Content: Thought content normal.        Judgment: Judgment normal.      No results found for any visits on 02/07/20.     Assessment & Plan    1. Cardiovascular disease All risk factors treated.  She is tolerating Imdur daily  2. Angina pectoris (HCC) Imdur  3. Benign essential HTN Cut quinapril HCT from twice daily to once a day at 20/12.5 dose  4. Controlled type 2 diabetes mellitus with other circulatory complication, unspecified whether long term insulin use (HCC) Good control with last A1c a month ago at 7.0. Return to clinic 2  months 5. Adult hypothyroidism   6. Mixed hyperlipidemia   7. Nonrheumatic aortic valve stenosis Followed by cardiology.  Patient looks very good for 86 year old.     Jasmine Webster Mon, MD  [name]

## 2020-02-19 ENCOUNTER — Other Ambulatory Visit: Payer: Self-pay | Admitting: Family Medicine

## 2020-02-23 ENCOUNTER — Ambulatory Visit (INDEPENDENT_AMBULATORY_CARE_PROVIDER_SITE_OTHER): Payer: PPO

## 2020-02-23 ENCOUNTER — Other Ambulatory Visit: Payer: Self-pay

## 2020-02-23 DIAGNOSIS — I272 Pulmonary hypertension, unspecified: Secondary | ICD-10-CM

## 2020-02-23 DIAGNOSIS — I350 Nonrheumatic aortic (valve) stenosis: Secondary | ICD-10-CM

## 2020-02-26 NOTE — Progress Notes (Signed)
Date:  02/26/2020   ID:  Jasmine Webster, DOB 07-31-1926, MRN [id number]  Patient Location:  [place] [address]   Provider location:   [facility], [place] office  PCP:  Jerrol Banana., MD  Cardiologist:  Arvid [facility]  Chief Complaint  Patient presents with  . office visit    F/U after echo; Meds verbally reviewed with patient.    History of Present Illness:    Jasmine Webster is a 86 y.o. female  past medical history of Diabetes type 2, hemoglobin A1c 6.8 GERD Hyperlipidemia Hypertension Cardiac murmur Arthritis Non smoker Who presents for chest pain on exertion, angina, murmur Echocardiogram confirming moderate aortic valve stenosis, elevated right heart pressures  On today's visit she presents with her daughter Patient is widowed, no smoker  Independent, doing very well Discussed recent echocardiogram findings showing moderate aortic valve stenosis, no change in mean gradient around 26 mmHg Right heart pressures 43 mmHg down from 60-year earlier Ejection fraction 65%  If she rushes , she develops some chest pressure and SOB Has to slow down  Denies any significant leg swelling, no PND orthopnea She is not taking her Crestor  Lab work reviewed HBA1C 7.0 Used to be  6.7  Lab work reviewed personally by myself Total Chol 273/ LDL 185 HBA1C 6.8 CR 1.03 Glucose 117   Prior CV studies:   The following studies were reviewed today:  Echo March 2020  1. The left ventricle has normal systolic function with an ejection fraction of 60-65%. The cavity size was normal. There is mildly increased left ventricular wall thickness. Left ventricular diastolic Doppler parameters are consistent with impaired  relaxation.  2. The right ventricle has normal systolic function. The cavity was normal. There is no increase in right ventricular wall thickness. Right ventricular systolic pressure is severely elevated with an  estimated pressure of 62.0 mmHg.  3. Tricuspid valve regurgitation is mild-moderate.  4. Mmoderate stenosis of the aortic valve. Mean gradient 26 mm Hg    Past Medical History:  Diagnosis Date  . Arthritis   . Cataract    cataract removal bilaterally approx 20 years ago  . Chronic back pain   . Coronary artery disease   . Diabetes mellitus without complication ([place])   . Heart murmur   . Hypertension    Past Surgical History:  Procedure Laterality Date  . ABDOMINAL HYSTERECTOMY    . APPENDECTOMY    . BACK SURGERY     three  . CARDIAC CATHETERIZATION     6+yrs no stents Dr. Verneda Skill  . CATARACT EXTRACTION    . THORACOLUMBAR [place]       No outpatient medications have been marked as taking for the 02/28/20 encounter (Appointment) with Minna Merritts, MD.     Allergies:   Etodolac, Naproxen, Statins, Latex, Terbinafine, and Terbinafine hcl   Social History   Tobacco Use  . Smoking status: Never Smoker  . Smokeless tobacco: Never Used  Substance Use Topics  . Alcohol use: No    Alcohol/week: 0.0 standard drinks  . Drug use: No     Current Outpatient Medications on File Prior to Visit  Medication Sig Dispense Refill  . ALPRAZolam (XANAX) 0.5 MG tablet TAKE 1 TABLET BY MOUTH EVERY DAY AT BEDTIME AS NEEDED 30 tablet 2  . Artificial Tear Ointment (DRY EYES OP) Apply 1 drop to eye daily.    Marland Kitchen  aspirin EC 81 MG tablet Take 81 mg by mouth daily.    Marland Kitchen gabapentin (NEURONTIN) 100 MG capsule 200 mg 2 (two) times daily.     Marland Kitchen glucose blood (ONE TOUCH ULTRA TEST) test strip Check sugar twice daily. 100 each 3  . HYDROcodone-acetaminophen (NORCO/VICODIN) 5-325 MG tablet Take 1 tablet by mouth every 6 (six) hours as needed for moderate pain.    . Insulin Pen Needle (B-D ULTRAFINE III SHORT PEN) 31G X 8 MM MISC CHECK SUGAR 2 TIMES DAILY, DX E11.9 (NEEDS ULTRA FINE PEN NEEDLES) 100 each 12  . Insulin Pen Needle (FIFTY50 PEN  NEEDLES) 31G X 8 MM MISC Check sugar 2 times daily, DX E11.9 (needs ultra fine pen needles)    . isosorbide mononitrate (IMDUR) 30 MG 24 hr tablet Take 1 tablet (30 mg total) by mouth daily. 30 tablet 12  . Lancets (ONETOUCH ULTRASOFT) lancets Check sugar twice daily DX E11.9 100 each 12  . LANTUS SOLOSTAR 100 UNIT/ML Solostar Pen INJECT 28 UNITS SUBCUTANEOUSLY AT BEDTIME 15 mL 5  . Multiple Vitamins-Minerals (PRESERVISION AREDS) CAPS Take 1 capsule by mouth 2 (two) times daily.     . nitroGLYCERIN (NITROSTAT) 0.4 MG SL tablet PLACE 1 TABLET (0.4 MG TOTAL) UNDER THE TONGUE EVERY 5 (FIVE) MINUTES AS NEEDED FOR CHEST PAIN. 75 tablet 1  . Omega-3 Fatty Acids (FISH OIL PO) Take by mouth daily.    Marland Kitchen omeprazole (PRILOSEC) 20 MG capsule TAKE 1 CAPSULE BY MOUTH EVERY DAY 90 capsule 1  . ONETOUCH ULTRA test strip CHECK SUGAR TWICE A DAY 200 strip 3  . quinapril-hydrochlorothiazide (ACCURETIC) 20-12.5 MG tablet TAKE 1 TABLET BY MOUTH TWICE A DAY 180 tablet 3  . rosuvastatin (CRESTOR) 5 MG tablet TAKE 1 TABLET BY MOUTH EVERY DAY (Patient not taking: Reported on 02/07/2020) 90 tablet 1   No current facility-administered medications on file prior to visit.     Family Hx: The patient's family history includes Cancer in her brother and brother; Diabetes in her father; Heart attack in her mother; Heart disease in her mother and sister; Pneumonia in her father; Stroke in her brother, father, and sister; Sudden death in her brother, sister, and sister.  ROS:   Please see the history of present illness.    Review of Systems  Constitutional: Negative.   Respiratory: Negative.   Cardiovascular: Negative.   Gastrointestinal: Negative.   Musculoskeletal: Positive for back pain.  Neurological: Negative.   Psychiatric/Behavioral: Negative.   All other systems reviewed and are negative.     Labs/Other Tests and Data Reviewed:    Recent Labs: 05/14/2019: ALT 20; BUN 23; Creatinine, Ser 1.05; Hemoglobin 12.9;  Platelets CANCELED; Potassium 4.0; Sodium 137; TSH 4.140   Recent Lipid Panel Lab Results  Component Value Date/Time   CHOL 171 05/14/2019 08:11 AM   TRIG 217 (H) 05/14/2019 08:11 AM   HDL 45 05/14/2019 08:11 AM   CHOLHDL 3.8 05/14/2019 08:11 AM   LDLCALC 83 05/14/2019 08:11 AM    Wt Readings from Last 3 Encounters:  02/07/20 164 lb (74.4 kg)  01/14/20 163 lb (73.9 kg)  01/10/20 163 lb (73.9 kg)     Exam:    BP 140/78 (BP Location: Left Arm, Patient Position: Sitting, Cuff Size: Normal)   Pulse 81   Ht 5\' 4"  (1.626 m)   Wt 162 lb 8 oz (73.7 kg)   SpO2 96%   BMI 27.89 kg/m  Constitutional:  oriented to person, place, and time. No distress.  HENT:  Head: Grossly normal Eyes:  no discharge. No scleral icterus.  Neck: No JVD, no carotid bruits  Cardiovascular: Regular rate and rhythm, no murmurs appreciated Pulmonary/Chest: Clear to auscultation bilaterally, no wheezes or rails Abdominal: Soft.  no distension.  no tenderness.  Musculoskeletal: Normal range of motion Neurological:  normal muscle tone. Coordination normal. No atrophy Skin: Skin warm and dry Psychiatric: normal affect, pleasant   ASSESSMENT & PLAN:    Aortic valve stenosis, etiology of cardiac valve disease unspecified  Long discussion concerning echocardiogram findings Continues to have moderate aortic valve stenosis, Very impressive murmur Asymptomatic We will continue to monitor with echocardiogram on annual basis Long discussion with family concerning symptoms to watch for Discussed possible need for treatment in the future if she develops shortness of breath chest tightness   Pulmonary hypertension (HCC) -  Improved pressures, some shortness of breath on exertion Recommend she take Lasix twice a week with potassium 10  Hyperlipidemia Not taking her Crestor  Hypertension Blood pressure is well controlled on today's visit. No changes made to the medications.   Left bundle branch  block Dating back several years   Tests Ordered: Echocardiogram in 1 year  Long discussion concerning her echocardiogram findings, elevated right heart pressures, options for fixing her aortic valve in the future Symptoms to watch for, limitations, medication changes as above  Total encounter time more than 45 minutes  Greater than 50% was spent in counseling and coordination of care with the patient    Disposition: Follow-up in 12 months after echocardiogram   Signed, Ida Rogue, MD  02/26/2020 10:10 PM    [place] Office [address] [place], [place], [place] [address]

## 2020-02-28 ENCOUNTER — Ambulatory Visit (INDEPENDENT_AMBULATORY_CARE_PROVIDER_SITE_OTHER): Payer: PPO | Admitting: Cardiovascular Disease

## 2020-02-28 ENCOUNTER — Other Ambulatory Visit: Payer: Self-pay

## 2020-02-28 ENCOUNTER — Encounter: Payer: Self-pay | Admitting: Cardiovascular Disease

## 2020-02-28 VITALS — BP 140/78 | HR 81 | Ht 64.0 in | Wt 162.5 lb

## 2020-02-28 DIAGNOSIS — I10 Essential (primary) hypertension: Secondary | ICD-10-CM

## 2020-02-28 DIAGNOSIS — E7800 Pure hypercholesterolemia, unspecified: Secondary | ICD-10-CM

## 2020-02-28 DIAGNOSIS — I272 Pulmonary hypertension, unspecified: Secondary | ICD-10-CM | POA: Diagnosis not present

## 2020-02-28 DIAGNOSIS — E782 Mixed hyperlipidemia: Secondary | ICD-10-CM

## 2020-02-28 DIAGNOSIS — R079 Chest pain, unspecified: Secondary | ICD-10-CM

## 2020-02-28 DIAGNOSIS — E1159 Type 2 diabetes mellitus with other circulatory complications: Secondary | ICD-10-CM | POA: Diagnosis not present

## 2020-02-28 DIAGNOSIS — I350 Nonrheumatic aortic (valve) stenosis: Secondary | ICD-10-CM

## 2020-02-28 MED ORDER — POTASSIUM CHLORIDE ER 10 MEQ PO TBCR: 10.0000 meq | EXTENDED_RELEASE_TABLET | ORAL | 3 refills | Status: DC

## 2020-02-28 MED ORDER — FUROSEMIDE 20 MG PO TABS: 20.0000 mg | ORAL_TABLET | ORAL | 3 refills | Status: DC

## 2020-02-28 NOTE — Patient Instructions (Addendum)
Medication Instructions:  Lasix/furosemide 20 mg with potassium 10 meq 2-3 x a week Ok to hold for low blood pressure, dry skin/dry mouth  If you need a refill on your cardiac medications before your next appointment, please call your pharmacy.    Lab work: No new labs needed   If you have labs (blood work) drawn today and your tests are completely normal, you will receive your results only by: Marland Kitchen MyChart Message (if you have MyChart) OR . A paper copy in the mail If you have any lab test that is abnormal or we need to change your treatment, we will call you to review the results.   Testing/Procedures: Echo in one year for aortic valve stenosis Your physician has requested that you have an echocardiogram. Echocardiography is a painless test that uses sound waves to create images of your heart. It provides your doctor with information about the size and shape of your heart and how well your heart's chambers and valves are working. This procedure takes approximately one hour. There are no restrictions for this procedure.     Follow-Up: At [facility], you and your health needs are our priority.  As part of our continuing mission to provide you with exceptional heart care, we have created designated Provider Care Teams.  These Care Teams include your primary Cardiologist (physician) and Advanced Practice Providers (APPs -  Physician Assistants and Nurse Practitioners) who all work together to provide you with the care you need, when you need it.  . You will need a follow up appointment in 12 months .  Marland Kitchen Providers on your designated Care Team:   . Murray Hodgkins, NP . Christell Faith, PA-C . Marrianne Mood, PA-C  Any Other Special Instructions Will Be Listed Below (If Applicable).  COVID-19 Vaccine Information can be found at: [email or web address].uk For questions related to vaccine distribution or appointments, please email  [email or web address] or call [phone].

## 2020-02-29 ENCOUNTER — Telehealth: Payer: Self-pay | Admitting: *Deleted

## 2020-02-29 DIAGNOSIS — I272 Pulmonary hypertension, unspecified: Secondary | ICD-10-CM

## 2020-02-29 NOTE — Telephone Encounter (Signed)
-----   Message from Minna Merritts, MD sent at 02/27/2020 10:43 AM EDT ----- If she goes on the Lasix will need a BMP in 2 to 3 weeks time ----- Message ----- From: Minna Merritts, MD Sent: 02/27/2020  10:42 AM EDT To: Valora Corporal, RN  Recent echocardiogram again confirmed moderate aortic valve stenosis This is likely contributing to elevated right heart pressures Right heart pressures remain moderately elevated In the past she has declined right heart catheterization for further work-up  Would recommend restart Lasix 20 mg every other day with potassium 10 for pulmonary hypertension There is significant work-up that could be done for the high pressures if she chooses

## 2020-02-29 NOTE — Telephone Encounter (Signed)
Spoke with patient and she has not started this medication yet. Advised that we would like her to come in for labs in 2-3 weeks for repeat labs. Instructed her to please call when she starts that medication and then we can schedule her to come in for BMP to be done roughly 3 weeks after starting. She verbalized understanding with no further questions at this time.

## 2020-03-03 ENCOUNTER — Telehealth: Payer: Self-pay | Admitting: Cardiovascular Disease

## 2020-03-03 ENCOUNTER — Ambulatory Visit: Payer: Self-pay | Admitting: *Deleted

## 2020-03-03 NOTE — Telephone Encounter (Signed)
Last night took first K+ and lasix- this morning she started cramping and has pain in hand and legs.Patient has called cardiologist for advisement on symptoms. Patient instruction is to take twice/week- patient took last night at 8 pm last night. Patient is calling for advisement from PCP while she is awaiting call back. Advised for chest pain - go to ED. Patient is with daughter now- [phone] Reason for Disposition . [1] Caller has URGENT medication question about med that PCP or specialist prescribed AND [2] triager unable to answer question  Answer Assessment - Initial Assessment Questions 1. ONSET: "When did the pain start?"     This morning 2. LOCATION: "Where is the pain located?"     Both legs and R hand 3. PAIN: "How bad is the pain?" (Scale 1-10; or mild, moderate, severe)   - MILD (1-3): doesn't interfere with normal activities   - MODERATE (4-7): interferes with normal activities (e.g., work or school) or awakens from sleep   - SEVERE (8-10): excruciating pain, unable to use hand at all     moderate 4. WORK OR EXERCISE: "Has there been any recent work or exercise that involved this part of the body?"     no 5. CAUSE: "What do you think is causing the pain?"     New medication 6. AGGRAVATING FACTORS: "What makes the pain worse?" (e.g., using computer)     Walking/laying back 7. OTHER SYMPTOMS: "Do you have any other symptoms?" (e.g., neck pain, swelling, rash, numbness, fever)     Leg cramps, hot/cold 8. PREGNANCY: "Is there any chance you are pregnant?" "When was your last menstrual period?"     n/a  Answer Assessment - Initial Assessment Questions 1. ONSET: "When did the pain start?"      today 2. LOCATION: "Where is the pain located?"      groin area- back of legs 3. PAIN: "How bad is the pain?"    (Scale 1-10; or mild, moderate, severe)   -  MILD (1-3): doesn't interfere with normal activities    -  MODERATE (4-7): interferes with normal activities (e.g., work  or school) or awakens from sleep, limping    -  SEVERE (8-10): excruciating pain, unable to do any normal activities, unable to walk     moderate 4. WORK OR EXERCISE: "Has there been any recent work or exercise that involved this part of the body?"      no 5. CAUSE: "What do you think is causing the leg pain?"     New medication 6. OTHER SYMPTOMS: "Do you have any other symptoms?" (e.g., chest pain, back pain, breathing difficulty, swelling, rash, fever, numbness, weakness)     no 7. PREGNANCY: "Is there any chance you are pregnant?" "When was your last menstrual period?"     n/a  Answer Assessment - Initial Assessment Questions 1.   NAME of MEDICATION: "What medicine are you calling about?"     Potasium and lasix 2.   QUESTION: "What is your question?"     Patient took first dosing last night and is having severe cramping in legs and hand 3.   PRESCRIBING HCP: "Who prescribed it?" Reason: if prescribed by specialist, call should be referred to that group.     Cardiologist- patient is awaiting call back 4. SYMPTOMS: "Do you have any symptoms?"     Cramping in legs and hand 5. SEVERITY: If symptoms are present, ask "Are they mild, moderate or severe?"     Moderate/severe 6.  PREGNANCY:  "Is there any chance that you are pregnant?" "When was your last menstrual period?"     n/a  Protocols used: MEDICATION QUESTION CALL-A-AH, HAND AND WRIST PAIN-A-AH, LEG PAIN-A-AH

## 2020-03-03 NOTE — Telephone Encounter (Signed)
Patient called to schedule.   Scheduled 5/4 at 10 am .  Please order so it can be linked for visit.

## 2020-03-03 NOTE — Telephone Encounter (Signed)
Spoke with patients daughter per release form and she states that her mother had severe cramps to the point that her hand curled up and she was not able to do anything. She explained that it was horrible and that she just could not tolerate that severe cramping. Advised that I would send message to provider about her severe cramping to see if we need to adjust anything and would give her a call back at the first of the week. She was appreciative for the call and verbalized understanding of our conversation.

## 2020-03-03 NOTE — Telephone Encounter (Signed)
Spoke with patient and she states that she took the furosemide at 8:30 PM at night and was up all night using the bathroom and had cramps. Reviewed that this medication needs to be taken early morning if possible and that it should only be taken 2 to 3 times per week. Instructed her to please only take 2 to 3 times per week and just make sure it is in the morning. She verbalized understanding with no further questions.

## 2020-03-03 NOTE — Telephone Encounter (Signed)
Pt c/o medication issue:  1. Name of Medication: lasix and potassium   2. How are you currently taking this medication (dosage and times per day)?   3. Are you having a reaction (difficulty breathing--STAT)? 4 trips to bathroom during the night  Patient having cramping shaking and feels dehydrated after 3 containers of water  4. What is your medication issue?  Cannot tolerate   Family on hold and wants an answer now as Dr. Rockey Situ said call if having any issues.

## 2020-03-03 NOTE — Addendum Note (Signed)
Addended by: Valora Corporal on: 03/03/2020 02:45 PM   Modules accepted: Orders

## 2020-03-05 ENCOUNTER — Other Ambulatory Visit: Payer: Self-pay | Admitting: Family Medicine

## 2020-03-05 DIAGNOSIS — F419 Anxiety disorder, unspecified: Secondary | ICD-10-CM

## 2020-03-05 NOTE — Telephone Encounter (Signed)
Requested medication (s) are due for refill today: yes  Requested medication (s) are on the active medication list: yes  Last refill:  11/24/19  Future visit scheduled: yes 04/10/20  Notes to clinic:  medication not delegated to NT to refill   Requested Prescriptions  Pending Prescriptions Disp Refills   ALPRAZolam (XANAX) 0.5 MG tablet [Pharmacy Med Name: ALPRAZOLAM 0.5 MG TABLET] 30 tablet     Sig: TAKE 1 TABLET BY MOUTH EVERY DAY AT BEDTIME AS NEEDED      Not Delegated - Psychiatry:  Anxiolytics/Hypnotics Failed - 03/05/2020 10:28 AM      Failed - This refill cannot be delegated      Failed - Urine Drug Screen completed in last 360 days.      Passed - Valid encounter within last 6 months    Recent Outpatient Visits           3 weeks ago Cardiovascular disease   [place] Jerrol Banana., MD   1 month ago Controlled type 2 diabetes mellitus with other circulatory complication, unspecified whether long term insulin use [facility])   [facility] Jerrol Banana., MD   5 months ago Controlled type 2 diabetes mellitus with other circulatory complication, unspecified whether long term insulin use [facility])   [facility] Jerrol Banana., MD   9 months ago Shortness of breath   [facility] Jerrol Banana., MD   9 months ago Controlled type 2 diabetes mellitus with other circulatory complication, unspecified whether long term insulin use [facility])   [facility] Jerrol Banana., MD       Future Appointments             In 1 month Jerrol Banana., MD [facility], [facility]

## 2020-03-05 NOTE — Telephone Encounter (Signed)
Would hold the lasix /potassium,  Only take for ankle sdwelling, abd bloating, worsening SOB We will need to proceed more cautiously than 2-3 times a week

## 2020-03-06 NOTE — Telephone Encounter (Signed)
Spoke with patient and reviewed provider recommendations regarding medications and she verbalized understanding with no further questions at this time. Inquired if she wanted me to call her daughter to review as well and she declined stating that she would review this information with her.

## 2020-03-14 ENCOUNTER — Other Ambulatory Visit: Payer: Self-pay | Admitting: Family Medicine

## 2020-03-14 DIAGNOSIS — I10 Essential (primary) hypertension: Secondary | ICD-10-CM

## 2020-03-16 ENCOUNTER — Ambulatory Visit: Payer: PPO | Admitting: Podiatry

## 2020-03-16 ENCOUNTER — Other Ambulatory Visit: Payer: Self-pay

## 2020-03-16 ENCOUNTER — Encounter: Payer: Self-pay | Admitting: Podiatry

## 2020-03-16 VITALS — Temp 97.8°F

## 2020-03-16 DIAGNOSIS — B351 Tinea unguium: Secondary | ICD-10-CM

## 2020-03-16 DIAGNOSIS — M79674 Pain in right toe(s): Secondary | ICD-10-CM | POA: Diagnosis not present

## 2020-03-16 DIAGNOSIS — M79675 Pain in left toe(s): Secondary | ICD-10-CM | POA: Diagnosis not present

## 2020-03-16 DIAGNOSIS — E119 Type 2 diabetes mellitus without complications: Secondary | ICD-10-CM | POA: Insufficient documentation

## 2020-03-16 NOTE — Progress Notes (Signed)
This patient returns to my office for at risk foot care.  This patient requires this care by a professional since this patient will be at risk due to having diabetes  This patient is unable to cut nails herself since the patient cannot reach her nails.These nails are painful walking and wearing shoes.  This patient presents for at risk foot care today.  General Appearance  Alert, conversant and in no acute stress.  Vascular  Dorsalis pedis and posterior tibial  pulses are palpable  bilaterally.  Capillary return is within normal limits  bilaterally. Temperature is within normal limits  bilaterally.  Neurologic  Senn-Weinstein monofilament wire test within normal limits  bilaterally. Muscle power within normal limits bilaterally.  Nails Thick disfigured discolored nails with subungual debris  from hallux to fifth toes bilaterally. No evidence of bacterial infection or drainage bilaterally.  Orthopedic  No limitations of motion  feet .  No crepitus or effusions noted.  HAV  B/L.  Hammer toes second  B/L.  Skin  normotropic skin with no porokeratosis noted bilaterally.  No signs of infections or ulcers noted.     Onychomycosis  Pain in right toes  Pain in left toes  Consent was obtained for treatment procedures.   Mechanical debridement of nails 1-5  bilaterally performed with a nail nipper.  Filed with dremel without incident.    Return office visit   3 months                   Told patient to return for periodic foot care and evaluation due to potential at risk complications.   Gardiner Barefoot DPM

## 2020-03-21 ENCOUNTER — Other Ambulatory Visit: Payer: Self-pay

## 2020-03-21 ENCOUNTER — Other Ambulatory Visit (INDEPENDENT_AMBULATORY_CARE_PROVIDER_SITE_OTHER): Payer: PPO

## 2020-03-21 DIAGNOSIS — I272 Pulmonary hypertension, unspecified: Secondary | ICD-10-CM | POA: Diagnosis not present

## 2020-03-22 LAB — BASIC METABOLIC PANEL
BUN/Creatinine Ratio: 20 (ref 12–28)
BUN: 19 mg/dL (ref 10–36)
CO2: 25 mmol/L (ref 20–29)
Calcium: 9.4 mg/dL (ref 8.7–10.3)
Chloride: 99 mmol/L (ref 96–106)
Creatinine, Ser: 0.97 mg/dL (ref 0.57–1.00)
GFR calc Af Amer: 59 mL/min/{1.73_m2} — ABNORMAL LOW (ref >59)
GFR calc non Af Amer: 51 mL/min/{1.73_m2} — ABNORMAL LOW (ref >59)
Glucose: 151 mg/dL — ABNORMAL HIGH (ref 65–99)
Potassium: 4.7 mmol/L (ref 3.5–5.2)
Sodium: 139 mmol/L (ref 134–144)

## 2020-03-27 ENCOUNTER — Telehealth: Payer: Self-pay

## 2020-03-27 NOTE — Telephone Encounter (Signed)
Call to patient to review labs.    Pt verbalized understanding and has no further questions at this time.    Advised pt to call for any further questions or concerns.  No further orders.    Pt wanting to know if she should continue holding fluid pill.

## 2020-03-27 NOTE — Telephone Encounter (Signed)
-----   Message from Minna Merritts, MD sent at 03/25/2020  8:22 PM EDT ----- Labs: Stable BMP

## 2020-03-28 ENCOUNTER — Emergency Department: Payer: PPO

## 2020-03-28 ENCOUNTER — Other Ambulatory Visit: Payer: Self-pay

## 2020-03-28 ENCOUNTER — Emergency Department
Admission: EM | Admit: 2020-03-28 | Discharge: 2020-03-28 | Disposition: A | Discharge status: Home / Self Care | Payer: PPO | Attending: Emergency Medicine | Admitting: Emergency Medicine

## 2020-03-28 DIAGNOSIS — Z79899 Other long term (current) drug therapy: Secondary | ICD-10-CM | POA: Insufficient documentation

## 2020-03-28 DIAGNOSIS — S0990XA Unspecified injury of head, initial encounter: Secondary | ICD-10-CM | POA: Insufficient documentation

## 2020-03-28 DIAGNOSIS — S0003XA Contusion of scalp, initial encounter: Secondary | ICD-10-CM | POA: Diagnosis not present

## 2020-03-28 DIAGNOSIS — Z91040 Latex allergy status: Secondary | ICD-10-CM | POA: Insufficient documentation

## 2020-03-28 DIAGNOSIS — I10 Essential (primary) hypertension: Secondary | ICD-10-CM | POA: Diagnosis not present

## 2020-03-28 DIAGNOSIS — Y92096 Garden or yard of other non-institutional residence as the place of occurrence of the external cause: Secondary | ICD-10-CM | POA: Diagnosis not present

## 2020-03-28 DIAGNOSIS — E119 Type 2 diabetes mellitus without complications: Secondary | ICD-10-CM | POA: Diagnosis not present

## 2020-03-28 DIAGNOSIS — S51812A Laceration without foreign body of left forearm, initial encounter: Secondary | ICD-10-CM | POA: Diagnosis not present

## 2020-03-28 DIAGNOSIS — I2510 Atherosclerotic heart disease of native coronary artery without angina pectoris: Secondary | ICD-10-CM | POA: Insufficient documentation

## 2020-03-28 DIAGNOSIS — W01198A Fall on same level from slipping, tripping and stumbling with subsequent striking against other object, initial encounter: Secondary | ICD-10-CM | POA: Diagnosis not present

## 2020-03-28 DIAGNOSIS — Z7982 Long term (current) use of aspirin: Secondary | ICD-10-CM | POA: Insufficient documentation

## 2020-03-28 DIAGNOSIS — Z7984 Long term (current) use of oral hypoglycemic drugs: Secondary | ICD-10-CM | POA: Insufficient documentation

## 2020-03-28 DIAGNOSIS — E039 Hypothyroidism, unspecified: Secondary | ICD-10-CM | POA: Diagnosis not present

## 2020-03-28 DIAGNOSIS — Y999 Unspecified external cause status: Secondary | ICD-10-CM | POA: Insufficient documentation

## 2020-03-28 DIAGNOSIS — Y9389 Activity, other specified: Secondary | ICD-10-CM | POA: Insufficient documentation

## 2020-03-28 DIAGNOSIS — Z8542 Personal history of malignant neoplasm of other parts of uterus: Secondary | ICD-10-CM | POA: Diagnosis not present

## 2020-03-28 DIAGNOSIS — W19XXXA Unspecified fall, initial encounter: Secondary | ICD-10-CM

## 2020-03-28 NOTE — ED Notes (Signed)
Pt states that she fell and hit her head and her arm. Pt has skin tears on her arm and a knot on her head. Pt denies loss of consciousness.

## 2020-03-28 NOTE — ED Provider Notes (Signed)
[facility] Emergency Department Provider Note ____________________________________________  Time seen: 1619  I have reviewed the triage vital signs and the nursing notes.  HISTORY  Chief Complaint  Fall and Laceration  HPI Jasmine Webster is a 86 y.o. female presents to the ED accompanied by her son, for evaluation of injuries sustained following mechanical fall.  Patient admits to  hitting her head and scraped her left arm on a concrete pole outside her patio.  She thinks that she got her flip-flop caught as she tried to step up on the landing.  This caused her to fall she attempted to brace her self with her left arm, but slid it down the pole resulted in several abrasions to the forearm.  She denies any loss of consciousness but does note a small knot to the back of the head.  She presents now with a mild headache denies any loss of consciousness, nausea, vomiting, or dizziness.  Past Medical History:  Diagnosis Date  . Arthritis   . Cataract    cataract removal bilaterally approx 20 years ago  . Chronic back pain   . Coronary artery disease   . Diabetes mellitus without complication ([place])   . Heart murmur   . Hypertension     Patient Active Problem List   Diagnosis Date Noted  . Diabetes mellitus without complication ([place]) 81/19/1478  . L-S radiculopathy 01/15/2019  . Angina pectoris ([place]) 12/28/2018  . Mixed hyperlipidemia 12/28/2018  . Arthritis 03/23/2015  . Cardiovascular disease 03/23/2015  . Urinary system disease 03/23/2015  . Malignant neoplasm of corpus uteri ([place]) 03/23/2015  . Benign essential HTN 03/23/2015  . Acid reflux 03/23/2015  . Cardiac murmur 03/23/2015  . Hypercholesteremia 03/23/2015  . Adult hypothyroidism 03/23/2015  . Cannot sleep 03/23/2015  . Malaise and fatigue 03/23/2015  . Muscle ache 03/23/2015  . Arthritis, degenerative 03/23/2015  . Diabetes mellitus type II, controlled ([place]) 03/23/2015    Past Surgical  History:  Procedure Laterality Date  . ABDOMINAL HYSTERECTOMY    . APPENDECTOMY    . BACK SURGERY     three  . CARDIAC CATHETERIZATION     6+yrs no stents Dr. Verneda Skill  . CATARACT EXTRACTION    . THORACOLUMBAR [place]      Prior to Admission medications   Medication Sig Start Date End Date Taking? Authorizing Provider  ALPRAZolam Duanne Moron) 0.5 MG tablet TAKE 1 TABLET BY MOUTH EVERY DAY AT BEDTIME AS NEEDED 03/06/20   Jerrol Banana., MD  Artificial Tear Ointment (DRY EYES OP) Apply 1 drop to eye daily.    [provider]  aspirin EC 81 MG tablet Take 81 mg by mouth daily.    [provider]  furosemide (LASIX) 20 MG tablet Take 1 tablet (20 mg total) by mouth as directed. Take 1 tablet (20 mg) 2-3 times a week 02/28/20 05/28/20  Minna Merritts, MD  gabapentin (NEURONTIN) 100 MG capsule 200 mg 2 (two) times daily.  11/15/16   [provider]  glucose blood (ONE TOUCH ULTRA TEST) test strip Check sugar twice daily. 06/22/19   Jerrol Banana., MD  HYDROcodone-acetaminophen (NORCO/VICODIN) 5-325 MG tablet Take 1 tablet by mouth every 6 (six) hours as needed for moderate pain.    [provider]  Insulin Pen Needle (B-D ULTRAFINE III SHORT PEN) 31G X 8 MM MISC CHECK SUGAR 2 TIMES DAILY, DX E11.9 (NEEDS ULTRA FINE PEN NEEDLES)  12/17/19   Jerrol Banana., MD  Insulin Pen Needle (FIFTY50 PEN NEEDLES) 31G X 8 MM MISC Check sugar 2 times daily, DX E11.9 (needs ultra fine pen needles) 02/19/16   [provider]  isosorbide mononitrate (IMDUR) 30 MG 24 hr tablet Take 1 tablet (30 mg total) by mouth daily. 05/11/19   Jerrol Banana., MD  Lancets [facility] ULTRASOFT) lancets Check sugar twice daily DX E11.9 04/21/17   Jerrol Banana., MD  LANTUS SOLOSTAR 100 UNIT/ML Solostar Pen INJECT 28 UNITS SUBCUTANEOUSLY AT BEDTIME 11/01/19   Jerrol Banana., MD  Multiple  Vitamins-Minerals (PRESERVISION AREDS) CAPS Take 1 capsule by mouth 2 (two) times daily.     [provider]  nitroGLYCERIN (NITROSTAT) 0.4 MG SL tablet PLACE 1 TABLET (0.4 MG TOTAL) UNDER THE TONGUE EVERY 5 (FIVE) MINUTES AS NEEDED FOR CHEST PAIN. 04/12/19   Jerrol Banana., MD  Omega-3 Fatty Acids (FISH OIL PO) Take by mouth daily.    [provider]  omeprazole (PRILOSEC) 20 MG capsule TAKE 1 CAPSULE BY MOUTH EVERY DAY 10/07/19   Jerrol Banana., MD  [facility] ULTRA test strip CHECK SUGAR TWICE A DAY 06/18/19   Jerrol Banana., MD  potassium chloride (KLOR-CON) 10 MEQ tablet Take 1 tablet (10 mEq total) by mouth as directed. Take 1 tablet (10 mEq) 2-3 times a week with furosemide. 02/28/20 05/28/20  Minna Merritts, MD  quinapril-hydrochlorothiazide (ACCURETIC) 20-12.5 MG tablet TAKE 1 TABLET BY MOUTH TWICE A DAY 03/14/20   Jerrol Banana., MD  rosuvastatin (CRESTOR) 5 MG tablet TAKE 1 TABLET BY MOUTH EVERY DAY 11/21/19   Jerrol Banana., MD    Allergies Etodolac, Naproxen, Statins, Latex, Terbinafine, and Terbinafine hcl  Family History  Problem Relation Age of Onset  . Heart disease Mother        died from MI  . Heart attack Mother   . Heart disease Sister   . Sudden death Brother        shot and beaten to death during home invasion  . Stroke Sister   . Sudden death Sister        MVA  . Cancer Brother        died from lung cancer  . Sudden death Sister        54 days old  . Cancer Brother        died from lung cancer  . Stroke Brother        cause of death  . Diabetes Father   . Stroke Father   . Pneumonia Father     Social History Social History   Tobacco Use  . Smoking status: Never Smoker  . Smokeless tobacco: Never Used  Substance Use Topics  . Alcohol use: No    Alcohol/week: 0.0 standard drinks  . Drug use: No    Review of Systems  Constitutional: Negative for fever. Eyes: Negative for visual changes. ENT:  Negative for sore throat. Cardiovascular: Negative for chest pain. Respiratory: Negative for shortness of breath. Gastrointestinal: Negative for abdominal pain, vomiting and diarrhea. Genitourinary: Negative for dysuria. Musculoskeletal: Negative for back pain. Skin: Negative for rash.  Skin tear as above. Neurological: Negative for focal weakness or numbness.  Mild headache as noted. ____________________________________________  PHYSICAL EXAM:  VITAL SIGNS: ED Triage Vitals  Enc Vitals Group     BP 03/28/20 1549 (!) 181/63     Pulse Rate 03/28/20 1549  83     Resp 03/28/20 1549 18     Temp 03/28/20 1549 98.4 F (36.9 C)     Temp Source 03/28/20 1549 Oral     SpO2 03/28/20 1549 99 %     Weight 03/28/20 1550 160 lb (72.6 kg)     Height 03/28/20 1550 5\' 4"  (1.626 m)     Head Circumference --      Peak Flow --      Pain Score 03/28/20 1550 3     Pain Loc --      Pain Edu? --      Excl. in [place]? --     Constitutional: Alert and oriented. Well appearing and in no distress.  GCS = 15 Head: Normocephalic and atraumatic, except for a soft tissue hematoma to the crown of the scalp.. Eyes: Conjunctivae are normal. PERRL. Normal extraocular movements Neck: Supple.  Normal range of motion without crepitus.  No distracting midline tenderness is noted. Cardiovascular: Normal rate, regular rhythm. Normal distal pulses. Respiratory: Normal respiratory effort. No wheezes/rales/rhonchi. Gastrointestinal: Soft and nontender. No distention. Musculoskeletal: Nontender with normal range of motion in all extremities.  Neurologic: Cranial nerves II through XII grossly intact.  Normal gait without ataxia. Normal speech and language. No gross focal neurologic deficits are appreciated. Skin:  Skin is warm, dry and intact. No rash noted.  3 distinct superficial skin tears to the left lateral forearm. Psychiatric: Mood and affect are normal. Patient exhibits appropriate insight and  judgment. ___________________________________________   RADIOLOGY  CT Head  Negative ____________________________________________  PROCEDURES  Wound care Procedures ____________________________________________  INITIAL IMPRESSION / ASSESSMENT AND PLAN / ED COURSE  Geriatric patient with ED evaluation of injury sustained following mechanical fall.  Patient apparently tripped over her shoe, resulting in the minor head injury and an abrasion to the left forearm.  The CT of the head is negative for any acute intracranial process.  Patient exam is overall benign and reassuring at this time.  3 superficial skin tears to left forearm are cleansed and dressed with nonstick gauze and tube gauze.  Patient is discharged with wound care instructions and supplies.  She will follow-up with her primary provider return to the ED as necessary.  JESSALYN HINOJOSA was evaluated in Emergency Department on 03/28/2020 for the symptoms described in the history of present illness. She was evaluated in the context of the global COVID-19 pandemic, which necessitated consideration that the patient might be at risk for infection with the SARS-CoV-2 virus that causes COVID-19. Institutional protocols and algorithms that pertain to the evaluation of patients at risk for COVID-19 are in a state of rapid change based on information released by regulatory bodies including the CDC and federal and state organizations. These policies and algorithms were followed during the patient's care in the ED. ____________________________________________  FINAL CLINICAL IMPRESSION(S) / ED DIAGNOSES  Final diagnoses:  Fall in home, initial encounter  Minor head injury, initial encounter  Skin tear of forearm without complication, left, initial encounter      Melvenia Needles, PA-C 03/28/20 1846    Duffy Bruce, MD 03/30/20 1420

## 2020-03-28 NOTE — ED Triage Notes (Addendum)
Pt arrives POV from home for reports of hitting her head and left arm on a concrete pole. Pt states she thinks she tripped over her shoe which caused her to fall. Pt A&Ox4 and in NAD. Reports slight headache and arm soreness. No LOC. Skin tear noted to left arm that is dressed, bleeding controlled

## 2020-03-28 NOTE — Discharge Instructions (Addendum)
Your exam and CT scan of the head are normal at this time. You do not  have any serious head injury. Your skin abrasions have been cleansed and dressed. Apply dressings daily. Follow-up with Dr. Rosanna Randy for continued symptoms.

## 2020-03-29 MED ORDER — FUROSEMIDE 20 MG PO TABS
20.0000 mg | ORAL_TABLET | Freq: Every day | ORAL | 3 refills | Status: DC | PRN
Start: 2020-03-29 — End: 2020-07-07

## 2020-03-29 NOTE — Telephone Encounter (Signed)
Called and spoke with patient about her fluid pill. She mentioned that she had previously called in reporting severe cramping when taking that medication to the point she could barely walk. She has upcoming appointment with Dr. Rosanna Randy and offered appointment in our office. She wanted to wait for now and will give [name] a call when she is ready to make appointment. She was appreciative for the call back with no further questions at this time.   March 05, 2020 Minna Merritts, MD  10:37 AM Note   Would hold the lasix /potassium,  Only take for ankle sdwelling, abd bloating, worsening SOB We will need to proceed more cautiously than 2-3 times a week

## 2020-03-29 NOTE — Telephone Encounter (Signed)
Call to patient to review POC from Dr. Rockey Situ. Pt verbalizes understanding and orders updated.   No further questions at this time.   Will follow up with PCP as scheduled.

## 2020-03-29 NOTE — Telephone Encounter (Signed)
I do not remember giving her any instructions to hold it This is what we put on her last instruction "Lasix/furosemide 20 mg with potassium 10 meq 2-3 x a week Ok to hold for low blood pressure, dry skin/dry mouth"

## 2020-03-29 NOTE — Telephone Encounter (Signed)
Okay to take Lasix as needed for shortness of breath and leg swelling Was just seen in clinic 3 weeks ago does not need another appointment

## 2020-04-04 NOTE — Progress Notes (Signed)
Established patient visit   Patient: Jasmine Webster   DOB: Jun 22, 1926   86 y.o. Female  MRN: [id number] Visit Date: 04/10/2020  Today's healthcare provider: [name], MD   Chief Complaint  Patient presents with  . Follow-up   Subjective    HPI  Patient was in the emergency room a couple of days ago with hypertensive response.  She has been unable to take her Lasix at full tablet because of leg cramping.  He has been able to tolerate half a tablet.  She is fatigued and does not have a normal energy to get up to be active as she usually does. Hypertension, follow-up  BP Readings from Last 3 Encounters:  04/10/20 (!) 102/59  04/08/20 (!) 102/48  03/28/20 (!) 181/63   Wt Readings from Last 3 Encounters:  04/10/20 163 lb (73.9 kg)  04/08/20 160 lb (72.6 kg)  03/28/20 160 lb (72.6 kg)     She was last seen for hypertension 2 months ago.  BP at that visit was 106/60. Management since that visit includes; Cut quinapril HCT from twice daily to once a day at 20/12.5 dose. She reports good compliance with treatment. She is not having side effects. Elevated blood pressure readings She is not exercising. She is adherent to low salt diet.   Outside blood pressures are home meter is broken.  She does not smoke.  Use of agents associated with hypertension: none.   --------------------------------------------------------------------   Past Medical History:  Diagnosis Date  . Arthritis   . Cataract    cataract removal bilaterally approx 20 years ago  . Chronic back pain   . Coronary artery disease   . Diabetes mellitus without complication ([place])   . Heart murmur   . Hypertension        Medications: Outpatient Medications Prior to Visit  Medication Sig  . ALPRAZolam (XANAX) 0.5 MG tablet TAKE 1 TABLET BY MOUTH EVERY DAY AT BEDTIME AS NEEDED  . Artificial Tear Ointment (DRY EYES OP) Apply 1 drop to eye daily.  Marland Kitchen aspirin EC 81 MG tablet Take 81 mg by mouth  daily.  . furosemide (LASIX) 20 MG tablet Take 1 tablet (20 mg total) by mouth daily as needed. Take 1 tablet (20 mg) 2-3 times a week (Patient taking differently: Take 10 mg by mouth daily as needed. Take 1 tablet (20 mg) 2-3 times a week)  . glucose blood (ONE TOUCH ULTRA TEST) test strip Check sugar twice daily.  . Insulin Pen Needle (B-D ULTRAFINE III SHORT PEN) 31G X 8 MM MISC CHECK SUGAR 2 TIMES DAILY, DX E11.9 (NEEDS ULTRA FINE PEN NEEDLES)  . Insulin Pen Needle (FIFTY50 PEN NEEDLES) 31G X 8 MM MISC Check sugar 2 times daily, DX E11.9 (needs ultra fine pen needles)  . isosorbide mononitrate (IMDUR) 30 MG 24 hr tablet Take 1 tablet (30 mg total) by mouth daily.  . Lancets (ONETOUCH ULTRASOFT) lancets Check sugar twice daily DX E11.9  . LANTUS SOLOSTAR 100 UNIT/ML Solostar Pen INJECT 28 UNITS SUBCUTANEOUSLY AT BEDTIME  . Multiple Vitamins-Minerals (PRESERVISION AREDS) CAPS Take 1 capsule by mouth 2 (two) times daily.   . nitroGLYCERIN (NITROSTAT) 0.4 MG SL tablet PLACE 1 TABLET (0.4 MG TOTAL) UNDER THE TONGUE EVERY 5 (FIVE) MINUTES AS NEEDED FOR CHEST PAIN.  Marland Kitchen Omega-3 Fatty Acids (FISH OIL PO) Take by mouth daily.  Glory Rosebush ULTRA test strip CHECK SUGAR TWICE A DAY  . potassium chloride (KLOR-CON) 10 MEQ tablet  Take 1 tablet (10 mEq total) by mouth as directed. Take 1 tablet (10 mEq) 2-3 times a week with furosemide.  . quinapril-hydrochlorothiazide (ACCURETIC) 20-12.5 MG tablet TAKE 1 TABLET BY MOUTH TWICE A DAY  . rosuvastatin (CRESTOR) 5 MG tablet TAKE 1 TABLET BY MOUTH EVERY DAY  . gabapentin (NEURONTIN) 100 MG capsule 200 mg 2 (two) times daily.   Marland Kitchen HYDROcodone-acetaminophen (NORCO/VICODIN) 5-325 MG tablet Take 1 tablet by mouth every 6 (six) hours as needed for moderate pain.  Marland Kitchen omeprazole (PRILOSEC) 20 MG capsule TAKE 1 CAPSULE BY MOUTH EVERY DAY   No facility-administered medications prior to visit.    Review of Systems  Constitutional: Positive for fatigue. Negative for appetite  change, chills and fever.  HENT: Negative.   Eyes: Negative.   Respiratory: Negative for chest tightness and shortness of breath.   Cardiovascular: Negative for chest pain and palpitations.  Gastrointestinal: Negative for abdominal pain, nausea and vomiting.  Endocrine: Negative.   Genitourinary: Negative.   Musculoskeletal: Positive for arthralgias.  Allergic/Immunologic: Negative.   Neurological: Negative for dizziness and weakness.  Hematological: Negative.   Psychiatric/Behavioral: Negative.     Last hemoglobin A1c Lab Results  Component Value Date   HGBA1C 7.0 (A) 01/10/2020      Objective    BP (!) 102/59 (BP Location: Right Arm, Patient Position: Sitting, Cuff Size: Large)   Pulse 74   Temp (!) 97.3 F (36.3 C) (Other (Comment))   Resp 18   Ht 5\' 4"  (1.626 m)   Wt 163 lb (73.9 kg)   SpO2 96%   BMI 27.98 kg/m  BP Readings from Last 3 Encounters:  04/10/20 (!) 102/59  04/08/20 (!) 102/48  03/28/20 (!) 181/63   Wt Readings from Last 3 Encounters:  04/10/20 163 lb (73.9 kg)  04/08/20 160 lb (72.6 kg)  03/28/20 160 lb (72.6 kg)      Physical Exam Vitals reviewed.  Constitutional:      Appearance: She is well-developed.  HENT:     Head: Normocephalic and atraumatic.     Right Ear: External ear normal.     Left Ear: External ear normal.     Nose: Nose normal.  Eyes:     Conjunctiva/sclera: Conjunctivae normal.     Pupils: Pupils are equal, round, and reactive to light.  Neck:     Thyroid: No thyromegaly.  Cardiovascular:     Rate and Rhythm: Normal rate and regular rhythm.     Heart sounds: Murmur present.     Comments: 3/6 Murmur at RUSB. Pulmonary:     Effort: Pulmonary effort is normal.     Breath sounds: Normal breath sounds.  Abdominal:     Palpations: Abdomen is soft.  Musculoskeletal:        General: Normal range of motion.     Cervical back: Normal range of motion and neck supple.  Skin:    General: Skin is warm and dry.  Neurological:      General: No focal deficit present.     Mental Status: She is alert and oriented to person, place, and time.     Deep Tendon Reflexes: Reflexes are normal and symmetric.  Psychiatric:        Mood and Affect: Mood normal.        Behavior: Behavior normal.        Thought Content: Thought content normal.        Judgment: Judgment normal.       No results found for  any visits on 04/10/20.  Assessment & Plan     1. Controlled type 2 diabetes mellitus with other circulatory complication, unspecified whether long term insulin use (HCC)  - CBC w/Diff/Platelet - Renal function panel - Hemoglobin A1C - TSH - insulin glargine (LANTUS SOLOSTAR) 100 UNIT/ML Solostar Pen; 90 day supply.  Dispense: 15 pen; Refill: 3  2. Benign essential HTN Try 1/2 tablet of Lasix along with potassium 3 times a week. - CBC w/Diff/Platelet - Renal function panel - Hemoglobin A1C - TSH  3. Adult hypothyroidism  - CBC w/Diff/Platelet - Renal function panel - Hemoglobin A1C - TSH  4. Fatigue, unspecified type At 89 I think her valve disease might be source of her dyspnea and fatigue. - CBC w/Diff/Platelet - Renal function panel - Hemoglobin A1C - TSH  5. Aortic valve disease She has appointment with Dr. Rockey Situ and a month. - CBC w/Diff/Platelet - Renal function panel - Hemoglobin A1C - TSH - Ambulatory referral to Cardiology   No follow-ups on file.      I, [name], MD, have reviewed all documentation for this visit. The documentation on 04/15/20 for the exam, diagnosis, procedures, and orders are all accurate and complete.    Kinsie Belford Cranford Mon, MD  [facility] [phone] (phone) [phone] (fax)  [place]

## 2020-04-05 ENCOUNTER — Telehealth: Payer: Self-pay | Admitting: Family Medicine

## 2020-04-05 NOTE — Chronic Care Management (AMB) (Signed)
  Chronic Care Management   Note  04/05/2020 Name: Jasmine Webster MRN: [id number] DOB: 1926/03/10  Jasmine Webster is a 86 y.o. year old female who is a primary care patient of Jerrol Banana., MD. I reached out to [name] by phone today in response to a referral sent by [name]'s health plan.     [name] was given information about Chronic Care Management services today including:  1. CCM service includes personalized support from designated clinical staff supervised by her physician, including individualized plan of care and coordination with other care providers 2. 24/7 contact phone numbers for assistance for urgent and routine care needs. 3. Service will only be billed when office clinical staff spend 20 minutes or more in a month to coordinate care. 4. Only one practitioner may furnish and bill the service in a calendar month. 5. The patient may stop CCM services at any time (effective at the end of the month) by phone call to the office staff. 6. The patient will be responsible for cost sharing (co-pay) of up to 20% of the service fee (after annual deductible is met).  Patient agreed to services and verbal consent obtained.   Follow up plan: Telephone appointment with care management team member scheduled for:05/04/2020  [address] [place], [place], [place] [address] Direct Dial: [email or web address] Website: [email or web address]

## 2020-04-08 ENCOUNTER — Emergency Department
Admission: EM | Admit: 2020-04-08 | Discharge: 2020-04-08 | Disposition: A | Discharge status: Home / Self Care | Payer: PPO | Attending: Emergency Medicine | Admitting: Emergency Medicine

## 2020-04-08 ENCOUNTER — Emergency Department: Payer: PPO

## 2020-04-08 ENCOUNTER — Other Ambulatory Visit: Payer: Self-pay

## 2020-04-08 ENCOUNTER — Encounter: Payer: Self-pay | Admitting: Emergency Medicine

## 2020-04-08 DIAGNOSIS — Z79899 Other long term (current) drug therapy: Secondary | ICD-10-CM | POA: Insufficient documentation

## 2020-04-08 DIAGNOSIS — Z794 Long term (current) use of insulin: Secondary | ICD-10-CM | POA: Diagnosis not present

## 2020-04-08 DIAGNOSIS — R0600 Dyspnea, unspecified: Secondary | ICD-10-CM | POA: Diagnosis not present

## 2020-04-08 DIAGNOSIS — R350 Frequency of micturition: Secondary | ICD-10-CM | POA: Insufficient documentation

## 2020-04-08 DIAGNOSIS — I10 Essential (primary) hypertension: Secondary | ICD-10-CM | POA: Diagnosis not present

## 2020-04-08 DIAGNOSIS — I272 Pulmonary hypertension, unspecified: Secondary | ICD-10-CM | POA: Diagnosis not present

## 2020-04-08 DIAGNOSIS — R0602 Shortness of breath: Secondary | ICD-10-CM | POA: Diagnosis not present

## 2020-04-08 DIAGNOSIS — E119 Type 2 diabetes mellitus without complications: Secondary | ICD-10-CM | POA: Diagnosis not present

## 2020-04-08 DIAGNOSIS — R252 Cramp and spasm: Secondary | ICD-10-CM | POA: Diagnosis not present

## 2020-04-08 DIAGNOSIS — Z8542 Personal history of malignant neoplasm of other parts of uterus: Secondary | ICD-10-CM | POA: Insufficient documentation

## 2020-04-08 DIAGNOSIS — R0609 Other forms of dyspnea: Secondary | ICD-10-CM

## 2020-04-08 DIAGNOSIS — Z91040 Latex allergy status: Secondary | ICD-10-CM | POA: Insufficient documentation

## 2020-04-08 DIAGNOSIS — Z7982 Long term (current) use of aspirin: Secondary | ICD-10-CM | POA: Diagnosis not present

## 2020-04-08 LAB — URINALYSIS, COMPLETE (UACMP) WITH MICROSCOPIC
Bacteria, UA: NONE SEEN
Bilirubin Urine: NEGATIVE
Glucose, UA: 50 mg/dL — AB
Hgb urine dipstick: NEGATIVE
Ketones, ur: NEGATIVE mg/dL
Leukocytes,Ua: NEGATIVE
Nitrite: NEGATIVE
Protein, ur: NEGATIVE mg/dL
Specific Gravity, Urine: 1.01 (ref 1.005–1.030)
pH: 6 (ref 5.0–8.0)

## 2020-04-08 LAB — CBC WITH DIFFERENTIAL/PLATELET
Abs Immature Granulocytes: 0.01 10*3/uL (ref 0.00–0.07)
Basophils Absolute: 0.1 10*3/uL (ref 0.0–0.1)
Basophils Relative: 1 %
Eosinophils Absolute: 0.1 10*3/uL (ref 0.0–0.5)
Eosinophils Relative: 3 %
HCT: 37.1 % (ref 36.0–46.0)
Hemoglobin: 12.3 g/dL (ref 12.0–15.0)
Immature Granulocytes: 0 %
Lymphocytes Relative: 35 %
Lymphs Abs: 1.9 10*3/uL (ref 0.7–4.0)
MCH: 32 pg (ref 26.0–34.0)
MCHC: 33.2 g/dL (ref 30.0–36.0)
MCV: 96.6 fL (ref 80.0–100.0)
Monocytes Absolute: 0.8 10*3/uL (ref 0.1–1.0)
Monocytes Relative: 14 %
Neutro Abs: 2.5 10*3/uL (ref 1.7–7.7)
Neutrophils Relative %: 47 %
Platelets: 150 10*3/uL (ref 150–400)
RBC: 3.84 MIL/uL — ABNORMAL LOW (ref 3.87–5.11)
RDW: 13.4 % (ref 11.5–15.5)
WBC: 5.4 10*3/uL (ref 4.0–10.5)
nRBC: 0 % (ref 0.0–0.2)

## 2020-04-08 LAB — TROPONIN I (HIGH SENSITIVITY)
Troponin I (High Sensitivity): 23 ng/L — ABNORMAL HIGH (ref <18)
Troponin I (High Sensitivity): 22 ng/L — ABNORMAL HIGH (ref <18)

## 2020-04-08 LAB — BASIC METABOLIC PANEL
Anion gap: 9 (ref 5–15)
BUN: 23 mg/dL (ref 8–23)
CO2: 27 mmol/L (ref 22–32)
Calcium: 8.9 mg/dL (ref 8.9–10.3)
Chloride: 98 mmol/L (ref 98–111)
Creatinine, Ser: 1.06 mg/dL — ABNORMAL HIGH (ref 0.44–1.00)
GFR calc Af Amer: 53 mL/min — ABNORMAL LOW (ref >60)
GFR calc non Af Amer: 46 mL/min — ABNORMAL LOW (ref >60)
Glucose, Bld: 206 mg/dL — ABNORMAL HIGH (ref 70–99)
Potassium: 4.1 mmol/L (ref 3.5–5.1)
Sodium: 134 mmol/L — ABNORMAL LOW (ref 135–145)

## 2020-04-08 LAB — BRAIN NATRIURETIC PEPTIDE: B Natriuretic Peptide: 98.2 pg/mL (ref 0.0–100.0)

## 2020-04-08 MED ORDER — CLONIDINE HCL 0.1 MG PO TABS
0.1000 mg | ORAL_TABLET | Freq: Once | ORAL | Status: AC
  Administered 2020-04-08: 0.1 mg via ORAL
  Filled 2020-04-08: qty 1

## 2020-04-08 MED ORDER — SODIUM CHLORIDE 0.9 % IV BOLUS
250.0000 mL | Freq: Once | INTRAVENOUS | Status: AC
  Administered 2020-04-08: 250 mL via INTRAVENOUS

## 2020-04-08 NOTE — ED Notes (Signed)
ED Provider at bedside. 

## 2020-04-08 NOTE — ED Notes (Signed)
Pt received approx 50 cc's of fluid, IV noted to be infiltrated, fluid bolus stopped by this RN, EDP made aware, pt's BP noted to be increased. This RN made EDP aware that patient's daughter requesting to know if UA done or would be ordered. EDP made aware states will go speak with daughter.

## 2020-04-08 NOTE — ED Provider Notes (Addendum)
[facility] Emergency Department Provider Note  ____________________________________________  Time seen: Approximately 11:45 AM  I have reviewed the triage vital signs and the nursing notes.   HISTORY  Chief Complaint Fatigue and Shortness of Breath    HPI Jasmine Webster is a 86 y.o. female with a history of CAD, dm, htn, aortic stenosis, pulm htn who comes to the ED complaining of dyspnea on exertion for the past 3 weeks.  Denies chest pain.  Denies orthopnea or PND.  No peripheral edema.  No fevers chills or cough.  Electronic medical record reviewed, including recent evaluations by cardiology, transthoracic echocardiogram in April 2021 showing EF of 60 to 65%, moderate aortic stenosis, pulmonary hypertension.  Patient was recommended to take Lasix 2 times a week with a potassium supplement.  However, she reports that the first time she took it, she took it in the evening and had to urinate frequently all night and got muscle cramps so she has not taken it since.  At the same time, recently her blood pressure medicine was decreased with the quinapril only once a day instead of twice, and amlodipine was discontinued.  Denies any significant headaches vision changes abdominal pain or back pain     Past Medical History:  Diagnosis Date  . Arthritis   . Cataract    cataract removal bilaterally approx 20 years ago  . Chronic back pain   . Coronary artery disease   . Diabetes mellitus without complication ([place])   . Heart murmur   . Hypertension      Patient Active Problem List   Diagnosis Date Noted  . Diabetes mellitus without complication ([place]) 69/48/5462  . L-S radiculopathy 01/15/2019  . Angina pectoris ([place]) 12/28/2018  . Mixed hyperlipidemia 12/28/2018  . Arthritis 03/23/2015  . Cardiovascular disease 03/23/2015  . Urinary system disease 03/23/2015  . Malignant neoplasm of corpus uteri ([place]) 03/23/2015  . Benign essential HTN 03/23/2015  .  Acid reflux 03/23/2015  . Cardiac murmur 03/23/2015  . Hypercholesteremia 03/23/2015  . Adult hypothyroidism 03/23/2015  . Cannot sleep 03/23/2015  . Malaise and fatigue 03/23/2015  . Muscle ache 03/23/2015  . Arthritis, degenerative 03/23/2015  . Diabetes mellitus type II, controlled ([place]) 03/23/2015     Past Surgical History:  Procedure Laterality Date  . ABDOMINAL HYSTERECTOMY    . APPENDECTOMY    . BACK SURGERY     three  . CARDIAC CATHETERIZATION     6+yrs no stents Dr. Verneda Skill  . CATARACT EXTRACTION    . THORACOLUMBAR [place]       Prior to Admission medications   Medication Sig Start Date End Date Taking? Authorizing Provider  ALPRAZolam Duanne Moron) 0.5 MG tablet TAKE 1 TABLET BY MOUTH EVERY DAY AT BEDTIME AS NEEDED 03/06/20   Jerrol Banana., MD  Artificial Tear Ointment (DRY EYES OP) Apply 1 drop to eye daily.    [provider]  aspirin EC 81 MG tablet Take 81 mg by mouth daily.    [provider]  furosemide (LASIX) 20 MG tablet Take 1 tablet (20 mg total) by mouth daily as needed. Take 1 tablet (20 mg) 2-3 times a week 03/29/20 06/27/20  Minna Merritts, MD  gabapentin (NEURONTIN) 100 MG capsule 200 mg 2 (two) times daily.  11/15/16   [provider]  glucose blood (ONE TOUCH ULTRA TEST) test strip Check sugar twice daily. 06/22/19   Eulas Post  Brooke Bonito., MD  HYDROcodone-acetaminophen (NORCO/VICODIN) 5-325 MG tablet Take 1 tablet by mouth every 6 (six) hours as needed for moderate pain.    [provider]  Insulin Pen Needle (B-D ULTRAFINE III SHORT PEN) 31G X 8 MM MISC CHECK SUGAR 2 TIMES DAILY, DX E11.9 (NEEDS ULTRA FINE PEN NEEDLES) 12/17/19   Jerrol Banana., MD  Insulin Pen Needle (FIFTY50 PEN NEEDLES) 31G X 8 MM MISC Check sugar 2 times daily, DX E11.9 (needs ultra fine pen needles) 02/19/16   [provider]  isosorbide mononitrate (IMDUR) 30 MG 24 hr tablet  Take 1 tablet (30 mg total) by mouth daily. 05/11/19   Jerrol Banana., MD  Lancets [facility] ULTRASOFT) lancets Check sugar twice daily DX E11.9 04/21/17   Jerrol Banana., MD  LANTUS SOLOSTAR 100 UNIT/ML Solostar Pen INJECT 28 UNITS SUBCUTANEOUSLY AT BEDTIME 11/01/19   Jerrol Banana., MD  Multiple Vitamins-Minerals (PRESERVISION AREDS) CAPS Take 1 capsule by mouth 2 (two) times daily.     [provider]  nitroGLYCERIN (NITROSTAT) 0.4 MG SL tablet PLACE 1 TABLET (0.4 MG TOTAL) UNDER THE TONGUE EVERY 5 (FIVE) MINUTES AS NEEDED FOR CHEST PAIN. 04/12/19   Jerrol Banana., MD  Omega-3 Fatty Acids (FISH OIL PO) Take by mouth daily.    [provider]  omeprazole (PRILOSEC) 20 MG capsule TAKE 1 CAPSULE BY MOUTH EVERY DAY 10/07/19   Jerrol Banana., MD  [facility] ULTRA test strip CHECK SUGAR TWICE A DAY 06/18/19   Jerrol Banana., MD  potassium chloride (KLOR-CON) 10 MEQ tablet Take 1 tablet (10 mEq total) by mouth as directed. Take 1 tablet (10 mEq) 2-3 times a week with furosemide. 02/28/20 05/28/20  Minna Merritts, MD  quinapril-hydrochlorothiazide (ACCURETIC) 20-12.5 MG tablet TAKE 1 TABLET BY MOUTH TWICE A DAY 03/14/20   Jerrol Banana., MD  rosuvastatin (CRESTOR) 5 MG tablet TAKE 1 TABLET BY MOUTH EVERY DAY 11/21/19   Jerrol Banana., MD     Allergies Etodolac, Naproxen, Statins, Latex, Terbinafine, and Terbinafine hcl   Family History  Problem Relation Age of Onset  . Heart disease Mother        died from MI  . Heart attack Mother   . Heart disease Sister   . Sudden death Brother        shot and beaten to death during home invasion  . Stroke Sister   . Sudden death Sister        MVA  . Cancer Brother        died from lung cancer  . Sudden death Sister        34 days old  . Cancer Brother        died from lung cancer  . Stroke Brother        cause of death  . Diabetes Father   . Stroke Father   . Pneumonia  Father     Social History Social History   Tobacco Use  . Smoking status: Never Smoker  . Smokeless tobacco: Never Used  Substance Use Topics  . Alcohol use: No    Alcohol/week: 0.0 standard drinks  . Drug use: No    Review of Systems  Constitutional:   No fever or chills.  ENT:   No sore throat. No rhinorrhea. Cardiovascular:   No chest pain or syncope. Respiratory:   Positive dyspnea on exertion without cough. Gastrointestinal:   Negative for  abdominal pain, vomiting and diarrhea.  Musculoskeletal:   Negative for focal pain or swelling All other systems reviewed and are negative except as documented above in ROS and HPI.  ____________________________________________   PHYSICAL EXAM:  VITAL SIGNS: ED Triage Vitals  Enc Vitals Group     BP 04/08/20 1015 (!) 188/65     Pulse Rate 04/08/20 1015 78     Resp 04/08/20 1015 16     Temp 04/08/20 1015 98.3 F (36.8 C)     Temp Source 04/08/20 1015 Oral     SpO2 04/08/20 1015 98 %     Weight 04/08/20 1012 160 lb (72.6 kg)     Height 04/08/20 1012 5\' 4"  (1.626 m)     Head Circumference --      Peak Flow --      Pain Score 04/08/20 1012 0     Pain Loc --      Pain Edu? --      Excl. in [place]? --     Vital signs reviewed, nursing assessments reviewed.   Constitutional:   Alert and oriented. Non-toxic appearance. Eyes:   Conjunctivae are normal. EOMI. PERRL. ENT      Head:   Normocephalic and atraumatic.      Nose:   Wearing a mask.      Mouth/Throat:   Wearing a mask.      Neck:   No meningismus. Full ROM.  No JVD Hematological/Lymphatic/Immunilogical:   No cervical lymphadenopathy. Cardiovascular:   RRR. Symmetric bilateral radial and DP pulses.  Loud systolic murmur. Cap refill less than 2 seconds. Respiratory:   Normal respiratory effort without tachypnea/retractions. Breath sounds are clear and equal bilaterally. No wheezes/rales/rhonchi. Gastrointestinal:   Soft and nontender. Non distended. There is no CVA  tenderness.  No rebound, rigidity, or guarding.  Musculoskeletal:   Normal range of motion in all extremities. No joint effusions.  No lower extremity tenderness.  No edema. Neurologic:   Normal speech and language.  Motor grossly intact. No acute focal neurologic deficits are appreciated.  Skin:    Skin is warm, dry and intact. No rash noted.  No petechiae, purpura, or bullae.  ____________________________________________    LABS (pertinent positives/negatives) (all labs ordered are listed, but only abnormal results are displayed) Labs Reviewed  BASIC METABOLIC PANEL - Abnormal; Notable for the following components:      Result Value   Sodium 134 (*)    Glucose, Bld 206 (*)    Creatinine, Ser 1.06 (*)    GFR calc non Af Amer 46 (*)    GFR calc Af Amer 53 (*)    All other components within normal limits  CBC WITH DIFFERENTIAL/PLATELET - Abnormal; Notable for the following components:   RBC 3.84 (*)    All other components within normal limits  TROPONIN I (HIGH SENSITIVITY) - Abnormal; Notable for the following components:   Troponin I (High Sensitivity) 23 (*)    All other components within normal limits  BRAIN NATRIURETIC PEPTIDE  TROPONIN I (HIGH SENSITIVITY)   ____________________________________________   EKG  Interpreted by me Normal sinus rhythm rate of 71, left axis, borderline left bundle branch block.  Voltage criteria for LVH in the high lateral leads with associated repolarization abnormality.  Poor R wave progression.  ST elevation in V2 with preserved concave upslope of T waves, otherwise normal ST segments and T waves.  Repeat EKG interpreted by me, obtained at 10:15 AM Normal sinus rhythm rate of 77, left axis,  left bundle branch block, poor R wave progression.  Normal ST segments and T waves.  Unchanged compared to prior EKGs in April 2021.  ____________________________________________    RADIOLOGY  DG Chest Portable 1 View  Result Date:  04/08/2020 CLINICAL DATA:  Fatigue and shortness of breath EXAM: PORTABLE CHEST 1 VIEW COMPARISON:  04/18/2017 FINDINGS: Cardiac shadow is stable. Aortic calcifications are seen. Postsurgical changes in the cervical and thoracolumbar spine are noted. No focal infiltrate or sizable effusion is seen. No bony abnormality is noted. Chronic changes about the left shoulder joint are seen. IMPRESSION: Chronic changes without acute abnormality. Electronically Signed   By: Inez Catalina M.D.   On: 04/08/2020 11:08    ____________________________________________   PROCEDURES Procedures  ____________________________________________  DIFFERENTIAL DIAGNOSIS   Non-STEMI, heart failure, pulmonary hypertension, deconditioning symptomatic hypertension  CLINICAL IMPRESSION / ASSESSMENT AND PLAN / ED COURSE  Medications ordered in the ED: Medications  cloNIDine (CATAPRES) tablet 0.1 mg (0.1 mg Oral Given 04/08/20 1106)    Pertinent labs & imaging results that were available during my care of the patient were reviewed by me and considered in my medical decision making (see chart for details).  Jasmine Webster was evaluated in Emergency Department on 04/08/2020 for the symptoms described in the history of present illness. She was evaluated in the context of the global COVID-19 pandemic, which necessitated consideration that the patient might be at risk for infection with the SARS-CoV-2 virus that causes COVID-19. Institutional protocols and algorithms that pertain to the evaluation of patients at risk for COVID-19 are in a state of rapid change based on information released by regulatory bodies including the CDC and federal and state organizations. These policies and algorithms were followed during the patient's care in the ED.   Patient presents with dyspnea on exertion for the past several weeks, in the setting of noncompliance with Lasix due to side effects.  Blood pressure is elevated at about 180/60 in the ED  which may be causing excess afterload in the setting of her aortic stenosis and pulmonary hypertension.  I will give a 0.1 mg clonidine while in the ED for initial blood pressure management and symptom relief.  Exam is benign and reassuring without evidence of heart failure.  Chest x-ray is unremarkable.  Initial labs are okay with a troponin of 23 which I suspect is baseline.  Will get a second troponin, plan to discharge to take her Lasix and follow-up with Dr. Rosanna Randy as scheduled next week.  Clinical Course as of Apr 09 2216  Sat Apr 08, 2020  1411 Pt with borderline blood pressure of 95/50, due to clonidine. Pt is not septic. Will give small volume IVF, monitor until improving.    [PS]    Clinical Course User Index [PS] Carrie Mew, MD    ----------------------------------------- 10:17 PM on 04/09/2020 -----------------------------------------  Blood pressure quickly returned to normal range.  Patient was stable for discharge home.   ____________________________________________   FINAL CLINICAL IMPRESSION(S) / ED DIAGNOSES    Final diagnoses:  Hypertension, unspecified type  Pulmonary hypertension ([place])  DOE (dyspnea on exertion)     ED Discharge Orders    None      Portions of this note were generated with dragon dictation software. Dictation errors may occur despite best attempts at proofreading.   Carrie Mew, MD 04/08/20 [place]    Carrie Mew, MD 04/09/20 2217

## 2020-04-08 NOTE — ED Triage Notes (Signed)
First nurse note- pt c/o SHOB and fatigue for couple days. Pulled for EKG

## 2020-04-08 NOTE — Discharge Instructions (Addendum)
Try cutting your Lasix pills in half to 10 mg and take 2-3 times a week to see if this produces a good amount of fluid output without intolerable side effects.  Take the potassium supplement with the pills as directed by your doctor.  Your blood tests today were reassuring.  Follow-up with your doctor next week to reassess blood pressure management.

## 2020-04-08 NOTE — ED Notes (Signed)
X-ray at bedside at this time.

## 2020-04-08 NOTE — ED Notes (Signed)
NAD noted at time of D/C. Pt and daughter state understanding of D/C instructions. Pt taken to lobby via wheelchair by this RN.

## 2020-04-08 NOTE — ED Notes (Signed)
Pt assisted to the bathroom by this RN. Pt back to bed without incident. Pt tolerated well. Pt c/o nausea, requesting a snack at this time. Per EDP pt okay to eat, pt given graham crackers and diet sprite at this time. Call bell remains within reach. Denies further needs. Explained will return to collect blood work at approx 1220.

## 2020-04-08 NOTE — ED Notes (Signed)
This RN called back to bedside at this time, pt c/o recurrent chest pressure at this time. Repeat EKG obtained by this RN. EDP made aware and signed off on EKG. No new orders.

## 2020-04-08 NOTE — ED Notes (Signed)
Plan of care discussed with patient and daughter who is at bedside. Pt repositioned in bed at this time, lights dimmed for comfort, pt given remote. Call bell within reach at this time. Pt denies further needs. Medications administered per EDP order. Pt remains SR on the monitor.

## 2020-04-08 NOTE — ED Notes (Signed)
This RN to bedside, repeat trop collected by this RN. Pt denies any needs, pt's daughter at bedside at this time. Call bell remains within reach. Pt resting in bed watching TV with lights dimmed.

## 2020-04-08 NOTE — ED Notes (Signed)
Per EDP Jari Pigg, pt cleared for discharge, pt's daughter assisting patient with getting dressed.

## 2020-04-08 NOTE — ED Notes (Signed)
Pt assisted to the bathroom for UA by this RN at this time.

## 2020-04-08 NOTE — ED Triage Notes (Signed)
Pt has had fatigue and SHOB for couple days. Unable to walk short distances without feeling increased dyspnea.  Unlabored currently.  Also has had "pings" in left chest intermittent over last couple days.  No pain at this time.

## 2020-04-08 NOTE — ED Notes (Signed)
This RN spoke with EDP prior to discharge regarding pt's BP now 96/44.Marland Kitchen VORB for 250 bolus of fluids and er-eval pt's BP. Fluids initiated by this RN. Plan of care discussed with patient and daughter who are at bedside. Pt visualized in NAD, resting in bed with eyes closed. Denies any needs at this time.

## 2020-04-09 LAB — URINE CULTURE: Culture: <10000 — AB

## 2020-04-10 ENCOUNTER — Ambulatory Visit (INDEPENDENT_AMBULATORY_CARE_PROVIDER_SITE_OTHER): Payer: PPO | Admitting: Family Medicine

## 2020-04-10 ENCOUNTER — Encounter: Payer: Self-pay | Admitting: Family Medicine

## 2020-04-10 ENCOUNTER — Other Ambulatory Visit: Payer: Self-pay

## 2020-04-10 VITALS — BP 102/59 | HR 74 | Temp 97.3°F | Resp 18 | Ht 64.0 in | Wt 163.0 lb

## 2020-04-10 DIAGNOSIS — I359 Nonrheumatic aortic valve disorder, unspecified: Secondary | ICD-10-CM

## 2020-04-10 DIAGNOSIS — E1159 Type 2 diabetes mellitus with other circulatory complications: Secondary | ICD-10-CM | POA: Diagnosis not present

## 2020-04-10 DIAGNOSIS — I10 Essential (primary) hypertension: Secondary | ICD-10-CM | POA: Diagnosis not present

## 2020-04-10 DIAGNOSIS — R5383 Other fatigue: Secondary | ICD-10-CM

## 2020-04-10 DIAGNOSIS — E039 Hypothyroidism, unspecified: Secondary | ICD-10-CM

## 2020-04-10 MED ORDER — LANTUS SOLOSTAR 100 UNIT/ML ~~LOC~~ SOPN: PEN_INJECTOR | SUBCUTANEOUS | 3 refills | Status: DC

## 2020-04-10 NOTE — Patient Instructions (Addendum)
Take 1/2 tablet of furosemide and potassium on Monday, Wednesday, and Friday. Follow up in 1-2 weeks.

## 2020-04-11 ENCOUNTER — Telehealth: Payer: Self-pay

## 2020-04-11 LAB — CBC WITH DIFFERENTIAL/PLATELET
Basophils Absolute: 0.1 10*3/uL (ref 0.0–0.2)
Basos: 1 %
EOS (ABSOLUTE): 0.1 10*3/uL (ref 0.0–0.4)
Eos: 2 %
Hematocrit: 35.8 % (ref 34.0–46.6)
Hemoglobin: 12.4 g/dL (ref 11.1–15.9)
Immature Grans (Abs): 0 10*3/uL (ref 0.0–0.1)
Immature Granulocytes: 0 %
Lymphocytes Absolute: 2.3 10*3/uL (ref 0.7–3.1)
Lymphs: 36 %
MCH: 32.2 pg (ref 26.6–33.0)
MCHC: 34.6 g/dL (ref 31.5–35.7)
MCV: 93 fL (ref 79–97)
Monocytes Absolute: 0.8 10*3/uL (ref 0.1–0.9)
Monocytes: 12 %
Neutrophils Absolute: 3.1 10*3/uL (ref 1.4–7.0)
Neutrophils: 49 %
Platelets: 53 10*3/uL — CL (ref 150–450)
RBC: 3.85 x10E6/uL (ref 3.77–5.28)
RDW: 13 % (ref 11.7–15.4)
WBC: 6.3 10*3/uL (ref 3.4–10.8)

## 2020-04-11 LAB — RENAL FUNCTION PANEL
Albumin: 4 g/dL (ref 3.5–4.6)
BUN/Creatinine Ratio: 21 (ref 12–28)
BUN: 24 mg/dL (ref 10–36)
CO2: 24 mmol/L (ref 20–29)
Calcium: 9.2 mg/dL (ref 8.7–10.3)
Chloride: 98 mmol/L (ref 96–106)
Creatinine, Ser: 1.13 mg/dL — ABNORMAL HIGH (ref 0.57–1.00)
GFR calc Af Amer: 49 mL/min/{1.73_m2} — ABNORMAL LOW (ref >59)
GFR calc non Af Amer: 43 mL/min/{1.73_m2} — ABNORMAL LOW (ref >59)
Glucose: 197 mg/dL — ABNORMAL HIGH (ref 65–99)
Phosphorus: 4 mg/dL (ref 3.0–4.3)
Potassium: 4.2 mmol/L (ref 3.5–5.2)
Sodium: 136 mmol/L (ref 134–144)

## 2020-04-11 LAB — HEMOGLOBIN A1C
Est. average glucose Bld gHb Est-mCnc: 151 mg/dL
Hgb A1c MFr Bld: 6.9 % — ABNORMAL HIGH (ref 4.8–5.6)

## 2020-04-11 LAB — TSH: TSH: 2.28 u[IU]/mL (ref 0.450–4.500)

## 2020-04-11 NOTE — Telephone Encounter (Signed)
-----   Message from Jerrol Banana., MD sent at 04/11/2020  8:44 AM EDT ----- Labs stable but platelets low.  We will repeat up platelets and CBC on next visit

## 2020-04-11 NOTE — Telephone Encounter (Signed)
Patient advised on lab results.

## 2020-04-12 ENCOUNTER — Ambulatory Visit: Payer: Self-pay | Admitting: *Deleted

## 2020-04-12 NOTE — Telephone Encounter (Signed)
Spoke to patient and she said that it was the 1/2 of the lasix that she took this morning and got the leg cramps and she said that she was not going to take any more of the lasix because she said that it is not helping.

## 2020-04-12 NOTE — Telephone Encounter (Signed)
Pt with daughter with her on speaker phone called in c/o having a reaction to the Lasix. I took the Lasix a week ago and I had a reaction.  Sunday I took 1/2 a tablet and I did not have a reaction.  This morning at 9:30 I took a whole Lasix and again had a reaction.   She is experiencing cramping in both legs, breathing fast and sweating a few minutes after taking it. "I walked the cramps out of my legs and my breathing is back to normal now".   "I'm concerned that this has happened twice when I've taken the whole pill. Dr. Rosanna Randy reduced me from taking it every day to M-W-F along with the potassium when I saw him Monday (04/10/2020).  "I didn't know what I should do".  I let her know I would send a high priority note to Dr. Rosanna Randy regarding the reaction and someone would give her a call back.   She and her daughter were agreeable to this plan.  Pt denied having any of the symptoms now.     Reason for Disposition . [1] Caller has URGENT medication question about med that PCP or specialist prescribed AND [2] triager unable to answer question    Reaction to the Lasix  Answer Assessment - Initial Assessment Questions 1.   NAME of MEDICATION: "What medicine are you calling about?"     Lasix  I take it 3 times a week.   I saw Dr. Rosanna Randy Tuesday. 2.   QUESTION: "What is your question?"     It's causing me to have cramps in both legs and sweating.   I took 1/2 tablet Sunday and no reaction.   I take it with potassium.   My breathing was fast then I sweated.   My breathing is back to normal. 3.   PRESCRIBING HCP: "Who prescribed it?" Reason: if prescribed by specialist, call should be referred to that group.     Dr. Rosanna Randy 4. SYMPTOMS: "Do you have any symptoms?"     I'm having cramps in both legs.   Sweating. 5. SEVERITY: If symptoms are present, ask "Are they mild, moderate or severe?"     It lasted 2-3 minutes.  I walked the cramps out. 6.  PREGNANCY:  "Is there any chance that you are  pregnant?" "When was your last menstrual period?"     N/A  Protocols used: MEDICATION QUESTION CALL-A-AH

## 2020-04-12 NOTE — Telephone Encounter (Signed)
Use only 1/2 pill every other day for now along with potassium.

## 2020-04-26 ENCOUNTER — Encounter: Payer: Self-pay | Admitting: Family Medicine

## 2020-04-26 ENCOUNTER — Other Ambulatory Visit: Payer: Self-pay

## 2020-04-26 ENCOUNTER — Ambulatory Visit (INDEPENDENT_AMBULATORY_CARE_PROVIDER_SITE_OTHER): Payer: PPO | Admitting: Family Medicine

## 2020-04-26 VITALS — BP 109/59 | HR 67 | Temp 96.8°F | Resp 18 | Ht 64.0 in | Wt 164.0 lb

## 2020-04-26 DIAGNOSIS — I10 Essential (primary) hypertension: Secondary | ICD-10-CM

## 2020-04-26 DIAGNOSIS — I38 Endocarditis, valve unspecified: Secondary | ICD-10-CM

## 2020-04-26 DIAGNOSIS — M5417 Radiculopathy, lumbosacral region: Secondary | ICD-10-CM

## 2020-04-26 DIAGNOSIS — E1159 Type 2 diabetes mellitus with other circulatory complications: Secondary | ICD-10-CM | POA: Diagnosis not present

## 2020-04-26 DIAGNOSIS — I2510 Atherosclerotic heart disease of native coronary artery without angina pectoris: Secondary | ICD-10-CM | POA: Diagnosis not present

## 2020-04-26 DIAGNOSIS — E7800 Pure hypercholesterolemia, unspecified: Secondary | ICD-10-CM | POA: Diagnosis not present

## 2020-04-26 NOTE — Progress Notes (Signed)
Established patient visit  I,[name],acting as a scribe for [name], MD.,have documented all relevant documentation on the behalf of [name], MD,as directed by  [name], MD while in the presence of [name], MD.   Patient: Jasmine Webster   DOB: May 02, 1926   86 y.o. Female  MRN: [id number] Visit Date: 04/26/2020  Today's healthcare provider: [name], MD   Chief Complaint  Patient presents with  . Follow-up  . Hypertension   Subjective    HPI  She is tolerating the half of Lasix Monday Wednesday Friday as long as she takes potassium before and with the Lasix.  No more lightheaded spells or orthostasis. Hypertension, follow-up  BP Readings from Last 3 Encounters:  04/26/20 (!) 109/59  04/10/20 (!) 102/59  04/08/20 (!) 102/48   Wt Readings from Last 3 Encounters:  04/26/20 164 lb (74.4 kg)  04/10/20 163 lb (73.9 kg)  04/08/20 160 lb (72.6 kg)     She was last seen for hypertension 1 months ago.  BP at that visit was 102/59. Management since that visit includes; Try 1/2 tablet of Lasix along with potassium 3 times a week.  She reports good compliance with treatment. She is having side effects. none She is exercising. She is adherent to low salt diet.   Outside blood pressures are is checking at home.  She does not smoke.  Use of agents associated with hypertension: none.   --------------------------------------------------------------------  Fatigue, unspecified type From 04/10/2020-At 89 I think her valve disease might be source of her dyspnea and fatigue.  Aortic valve disease From 04/10/2020-She has appointment with Dr. Rockey Situ and a month.      Medications: Outpatient Medications Prior to Visit  Medication Sig  . ALPRAZolam (XANAX) 0.5 MG tablet TAKE 1 TABLET BY MOUTH EVERY DAY AT BEDTIME AS NEEDED  . Artificial Tear Ointment (DRY EYES OP) Apply 1 drop to eye daily.  Marland Kitchen aspirin EC 81 MG tablet  Take 81 mg by mouth daily.  . furosemide (LASIX) 20 MG tablet Take 1 tablet (20 mg total) by mouth daily as needed. Take 1 tablet (20 mg) 2-3 times a week (Patient taking differently: Take 10 mg by mouth daily as needed. Take 1/2 tablet (20 mg) 2-3 times a week)  . glucose blood (ONE TOUCH ULTRA TEST) test strip Check sugar twice daily.  Marland Kitchen HYDROcodone-acetaminophen (NORCO/VICODIN) 5-325 MG tablet Take 1 tablet by mouth every 6 (six) hours as needed for moderate pain.  Marland Kitchen insulin glargine (LANTUS SOLOSTAR) 100 UNIT/ML Solostar Pen 90 day supply.  . Insulin Pen Needle (B-D ULTRAFINE III SHORT PEN) 31G X 8 MM MISC CHECK SUGAR 2 TIMES DAILY, DX E11.9 (NEEDS ULTRA FINE PEN NEEDLES)  . Insulin Pen Needle (FIFTY50 PEN NEEDLES) 31G X 8 MM MISC Check sugar 2 times daily, DX E11.9 (needs ultra fine pen needles)  . Lancets (ONETOUCH ULTRASOFT) lancets Check sugar twice daily DX E11.9  . Multiple Vitamins-Minerals (PRESERVISION AREDS) CAPS Take 1 capsule by mouth 2 (two) times daily.   . nitroGLYCERIN (NITROSTAT) 0.4 MG SL tablet PLACE 1 TABLET (0.4 MG TOTAL) UNDER THE TONGUE EVERY 5 (FIVE) MINUTES AS NEEDED FOR CHEST PAIN.  Marland Kitchen Omega-3 Fatty Acids (FISH OIL PO) Take by mouth daily.  Marland Kitchen omeprazole (PRILOSEC) 20 MG capsule TAKE 1 CAPSULE BY MOUTH EVERY DAY  . ONETOUCH ULTRA test strip CHECK SUGAR TWICE A DAY  . potassium chloride (KLOR-CON) 10 MEQ tablet Take 1  tablet (10 mEq total) by mouth as directed. Take 1 tablet (10 mEq) 2-3 times a week with furosemide.  . quinapril-hydrochlorothiazide (ACCURETIC) 20-12.5 MG tablet TAKE 1 TABLET BY MOUTH TWICE A DAY  . rosuvastatin (CRESTOR) 5 MG tablet TAKE 1 TABLET BY MOUTH EVERY DAY  . gabapentin (NEURONTIN) 100 MG capsule 200 mg 2 (two) times daily.   . isosorbide mononitrate (IMDUR) 30 MG 24 hr tablet Take 1 tablet (30 mg total) by mouth daily. (Patient not taking: Reported on 04/26/2020)   No facility-administered medications prior to visit.    Review of Systems    Constitutional: Negative for appetite change, chills, fatigue and fever.  HENT: Negative.   Eyes: Negative.   Respiratory: Negative for chest tightness and shortness of breath.   Cardiovascular: Negative for chest pain and palpitations.  Gastrointestinal: Negative for abdominal pain, nausea and vomiting.  Endocrine: Negative.   Allergic/Immunologic: Negative.   Neurological: Negative for dizziness and weakness.  Hematological: Negative.   Psychiatric/Behavioral: Negative.        Objective    BP (!) 109/59 (BP Location: Right Arm, Patient Position: Sitting, Cuff Size: Large)   Pulse 67   Temp (!) 96.8 F (36 C) (Other (Comment))   Resp 18   Ht 5\' 4"  (1.626 m)   Wt 164 lb (74.4 kg)   SpO2 96%   BMI 28.15 kg/m  BP Readings from Last 3 Encounters:  04/26/20 (!) 109/59  04/10/20 (!) 102/59  04/08/20 (!) 102/48   Wt Readings from Last 3 Encounters:  04/26/20 164 lb (74.4 kg)  04/10/20 163 lb (73.9 kg)  04/08/20 160 lb (72.6 kg)      Physical Exam Vitals reviewed.  Constitutional:      Appearance: She is well-developed.  HENT:     Head: Normocephalic and atraumatic.     Right Ear: External ear normal.     Left Ear: External ear normal.     Nose: Nose normal.  Eyes:     Conjunctiva/sclera: Conjunctivae normal.     Pupils: Pupils are equal, round, and reactive to light.  Neck:     Thyroid: No thyromegaly.  Cardiovascular:     Rate and Rhythm: Normal rate and regular rhythm.     Heart sounds: Murmur heard.      Comments: 3/6 Murmur at RUSB. Pulmonary:     Effort: Pulmonary effort is normal.     Breath sounds: Normal breath sounds.  Abdominal:     Palpations: Abdomen is soft.  Musculoskeletal:        General: Normal range of motion.     Cervical back: Normal range of motion and neck supple.  Skin:    General: Skin is warm and dry.  Neurological:     General: No focal deficit present.     Mental Status: She is alert and oriented to person, place, and time.      Deep Tendon Reflexes: Reflexes are normal and symmetric.  Psychiatric:        Mood and Affect: Mood normal.        Behavior: Behavior normal.        Thought Content: Thought content normal.        Judgment: Judgment normal.       No results found for any visits on 04/26/20.  Assessment & Plan     1. Essential (primary) hypertension Try to go to a full Lasix Monday Wednesday Friday along with the potassium.  She sees Dr. Rockey Situ in 2 weeks  and I will see her in 3 weeks.  2. Cardiovascular disease All risk factors treated   3. Controlled type 2 diabetes mellitus with other circulatory complication, unspecified whether long term insulin use (HCC) Last A1c was 6.9.  4. L-S radiculopathy Patient walks with a cane and has had no falls recently.  5. Hypercholesteremia On rosuvastatin  6. Valvular heart disease Aortic valve disease followed by Dr. Rockey Situ.   No follow-ups on file.      I, [name], MD, have reviewed all documentation for this visit. The documentation on 05/01/20 for the exam, diagnosis, procedures, and orders are all accurate and complete.    Deneane Stifter Cranford Mon, MD  [facility] [phone] (phone) [phone] (fax)  [place]

## 2020-04-27 ENCOUNTER — Ambulatory Visit: Payer: Self-pay | Admitting: Family Medicine

## 2020-05-04 ENCOUNTER — Telehealth: Payer: PPO

## 2020-05-04 ENCOUNTER — Ambulatory Visit: Payer: Self-pay

## 2020-05-04 NOTE — Chronic Care Management (AMB) (Signed)
  Chronic Care Management   Outreach Note  05/04/2020 Name: Jasmine Webster MRN: [id number] DOB: 1926-02-15  Primary Care Provider: Jerrol Banana., MD Reason for referral : Chronic Care Management   An unsuccessful telephone outreach was attempted today. Ms. Wassel was referred to the case management team for assistance with care management and care coordination.   Phone rang multiple times without an option to leave a voice message.   Follow Up Plan: The care management team will reach out to Ms. Barkalow again within the next two weeks.   Horris Latino [facility] Care Management [phone]

## 2020-05-12 ENCOUNTER — Other Ambulatory Visit: Payer: Self-pay | Admitting: Family Medicine

## 2020-05-16 NOTE — Progress Notes (Signed)
Date:  05/17/2020   ID:  Jasmine Webster, DOB 1925/11/29, MRN [id number]  Patient Location:  [address] [place] [address]   Provider location:   [facility], [place] office  PCP:  Jerrol Banana., MD  Cardiologist:  Arvid Right [facility]  Chief Complaint  Patient presents with  . Other     Per Dr. Rosanna Randy - Aortic stenosis. Meds reviewed verbally with patient.     History of Present Illness:    Jasmine Webster is a 86 y.o. female  past medical history of Diabetes type 2, hemoglobin A1c 6.8 GERD Hyperlipidemia Hypertension Arthritis Non smoker Who presents for chest pain on exertion, angina, murmur Echocardiogram confirming moderate aortic valve stenosis, elevated right heart pressures  Presents today with family  Recent echocardiogram February 23, 2020 results reviewed, Moderate aortic valve stenosis Valve velocity 3.59 m/s, mean gradient 25.36mmHg Moderately elevated right heart pressures  Given elevated right heart pressures were started on Lasix 20 mg daily with potassium She reports that she was taking this in the evening, started developing cramping, stop the Lasix  She has restarted the Lasix 10 mg daily every other day with slow titration now taking 20 mg every other day  Relatively sedentary at home, no regular exercise program, legs are weak, gait instability With rushing gets SOB, legs weak, Has to slow down secondary to leg strength less from breathing, Denies chest pressure, no near syncope or syncope Does house chores, does laundry Has several steps going into the house  Patient is widowed, no smoker Denies significant leg swelling  Echocardiogram 1 year ago findings showing moderate aortic valve stenosis, no change in mean gradient around 26 mmHg Right heart pressures 43 mmHg down from 60-year earlier Ejection fraction 65%  Previous inconsistency with taking her Crestor  Lab work reviewed HBA1C 6.9  EKG personally  reviewed by myself on todays visit Shows normal sinus rhythm rate 70 bpm, left bundle branch block  Prior CV studies:   The following studies were reviewed today:  Echo March 2020  1. The left ventricle has normal systolic function with an ejection fraction of 60-65%. The cavity size was normal. There is mildly increased left ventricular wall thickness. Left ventricular diastolic Doppler parameters are consistent with impaired  relaxation.  2. The right ventricle has normal systolic function. The cavity was normal. There is no increase in right ventricular wall thickness. Right ventricular systolic pressure is severely elevated with an estimated pressure of 62.0 mmHg.  3. Tricuspid valve regurgitation is mild-moderate.  4. Mmoderate stenosis of the aortic valve. Mean gradient 26 mm Hg   Past Medical History:  Diagnosis Date  . Arthritis   . Cataract    cataract removal bilaterally approx 20 years ago  . Chronic back pain   . Coronary artery disease   . Diabetes mellitus without complication ([place])   . Heart murmur   . Hypertension    Past Surgical History:  Procedure Laterality Date  . ABDOMINAL HYSTERECTOMY    . APPENDECTOMY    . BACK SURGERY     three  . CARDIAC CATHETERIZATION     6+yrs no stents Dr. Verneda Skill  . CATARACT EXTRACTION    . THORACOLUMBAR SYRINGO SHUNT    . TONSILLECTOMY AND ADENOIDECTOMY       Current Meds  Medication Sig  . ALPRAZolam (XANAX) 0.5 MG tablet TAKE 1 TABLET BY MOUTH EVERY DAY AT BEDTIME AS NEEDED  . Artificial Tear  Ointment (DRY EYES OP) Apply 1 drop to eye daily.  Marland Kitchen aspirin EC 81 MG tablet Take 81 mg by mouth daily.  . furosemide (LASIX) 20 MG tablet Take 1 tablet (20 mg total) by mouth daily as needed. Take 1 tablet (20 mg) 2-3 times a week (Patient taking differently: Take 10 mg by mouth daily as needed. Take 1/2 tablet (20 mg) 2-3 times a week)  . gabapentin (NEURONTIN) 100 MG capsule 200 mg 2 (two) times daily.   Marland Kitchen glucose blood  (ONE TOUCH ULTRA TEST) test strip Check sugar twice daily.  Marland Kitchen HYDROcodone-acetaminophen (NORCO/VICODIN) 5-325 MG tablet Take 1 tablet by mouth every 6 (six) hours as needed for moderate pain.  Marland Kitchen insulin glargine (LANTUS SOLOSTAR) 100 UNIT/ML Solostar Pen 90 day supply.  . Insulin Pen Needle (B-D ULTRAFINE III SHORT PEN) 31G X 8 MM MISC CHECK SUGAR 2 TIMES DAILY, DX E11.9 (NEEDS ULTRA FINE PEN NEEDLES)  . Insulin Pen Needle (FIFTY50 PEN NEEDLES) 31G X 8 MM MISC Check sugar 2 times daily, DX E11.9 (needs ultra fine pen needles)  . isosorbide mononitrate (IMDUR) 30 MG 24 hr tablet TAKE 1 TABLET BY MOUTH EVERY DAY  . Lancets (ONETOUCH ULTRASOFT) lancets Check sugar twice daily DX E11.9  . Multiple Vitamins-Minerals (PRESERVISION AREDS) CAPS Take 1 capsule by mouth 2 (two) times daily.   . nitroGLYCERIN (NITROSTAT) 0.4 MG SL tablet PLACE 1 TABLET (0.4 MG TOTAL) UNDER THE TONGUE EVERY 5 (FIVE) MINUTES AS NEEDED FOR CHEST PAIN.  Marland Kitchen Omega-3 Fatty Acids (FISH OIL PO) Take by mouth daily.  Marland Kitchen omeprazole (PRILOSEC) 20 MG capsule TAKE 1 CAPSULE BY MOUTH EVERY DAY  . ONETOUCH ULTRA test strip CHECK SUGAR TWICE A DAY  . potassium chloride (KLOR-CON) 10 MEQ tablet Take 1 tablet (10 mEq total) by mouth as directed. Take 1 tablet (10 mEq) 2-3 times a week with furosemide.  . quinapril-hydrochlorothiazide (ACCURETIC) 20-12.5 MG tablet TAKE 1 TABLET BY MOUTH TWICE A DAY  . rosuvastatin (CRESTOR) 5 MG tablet TAKE 1 TABLET BY MOUTH EVERY DAY     Allergies:   Etodolac, Naproxen, Statins, Latex, Terbinafine, and Terbinafine hcl   Social History   Tobacco Use  . Smoking status: Never Smoker  . Smokeless tobacco: Never Used  Vaping Use  . Vaping Use: Never used  Substance Use Topics  . Alcohol use: No    Alcohol/week: 0.0 standard drinks  . Drug use: No     Current Outpatient Medications on File Prior to Visit  Medication Sig Dispense Refill  . ALPRAZolam (XANAX) 0.5 MG tablet TAKE 1 TABLET BY MOUTH EVERY DAY  AT BEDTIME AS NEEDED 30 tablet 5  . Artificial Tear Ointment (DRY EYES OP) Apply 1 drop to eye daily.    Marland Kitchen aspirin EC 81 MG tablet Take 81 mg by mouth daily.    . furosemide (LASIX) 20 MG tablet Take 1 tablet (20 mg total) by mouth daily as needed. Take 1 tablet (20 mg) 2-3 times a week (Patient taking differently: Take 10 mg by mouth daily as needed. Take 1/2 tablet (20 mg) 2-3 times a week) 36 tablet 3  . gabapentin (NEURONTIN) 100 MG capsule 200 mg 2 (two) times daily.     Marland Kitchen glucose blood (ONE TOUCH ULTRA TEST) test strip Check sugar twice daily. 100 each 3  . HYDROcodone-acetaminophen (NORCO/VICODIN) 5-325 MG tablet Take 1 tablet by mouth every 6 (six) hours as needed for moderate pain.    Marland Kitchen insulin glargine (LANTUS SOLOSTAR) 100  UNIT/ML Solostar Pen 90 day supply. 15 pen 3  . Insulin Pen Needle (B-D ULTRAFINE III SHORT PEN) 31G X 8 MM MISC CHECK SUGAR 2 TIMES DAILY, DX E11.9 (NEEDS ULTRA FINE PEN NEEDLES) 100 each 12  . Insulin Pen Needle (FIFTY50 PEN NEEDLES) 31G X 8 MM MISC Check sugar 2 times daily, DX E11.9 (needs ultra fine pen needles)    . isosorbide mononitrate (IMDUR) 30 MG 24 hr tablet TAKE 1 TABLET BY MOUTH EVERY DAY 90 tablet 1  . Lancets (ONETOUCH ULTRASOFT) lancets Check sugar twice daily DX E11.9 100 each 12  . Multiple Vitamins-Minerals (PRESERVISION AREDS) CAPS Take 1 capsule by mouth 2 (two) times daily.     . nitroGLYCERIN (NITROSTAT) 0.4 MG SL tablet PLACE 1 TABLET (0.4 MG TOTAL) UNDER THE TONGUE EVERY 5 (FIVE) MINUTES AS NEEDED FOR CHEST PAIN. 75 tablet 1  . Omega-3 Fatty Acids (FISH OIL PO) Take by mouth daily.    Marland Kitchen omeprazole (PRILOSEC) 20 MG capsule TAKE 1 CAPSULE BY MOUTH EVERY DAY 90 capsule 1  . ONETOUCH ULTRA test strip CHECK SUGAR TWICE A DAY 200 strip 3  . potassium chloride (KLOR-CON) 10 MEQ tablet Take 1 tablet (10 mEq total) by mouth as directed. Take 1 tablet (10 mEq) 2-3 times a week with furosemide. 36 tablet 3  . quinapril-hydrochlorothiazide (ACCURETIC)  20-12.5 MG tablet TAKE 1 TABLET BY MOUTH TWICE A DAY 180 tablet 3  . rosuvastatin (CRESTOR) 5 MG tablet TAKE 1 TABLET BY MOUTH EVERY DAY 90 tablet 1   No current facility-administered medications on file prior to visit.     Family Hx: The patient's family history includes Cancer in her brother and brother; Diabetes in her father; Heart attack in her mother; Heart disease in her mother and sister; Pneumonia in her father; Stroke in her brother, father, and sister; Sudden death in her brother, sister, and sister.  ROS:   Please see the history of present illness.    Review of Systems  Constitutional: Negative.   Respiratory: Negative.   Cardiovascular: Negative.   Gastrointestinal: Negative.   Musculoskeletal: Positive for back pain.  Neurological: Negative.   Psychiatric/Behavioral: Negative.   All other systems reviewed and are negative.    Labs/Other Tests and Data Reviewed:    Recent Labs: 04/08/2020: B Natriuretic Peptide 98.2 04/10/2020: BUN 24; Creatinine, Ser 1.13; Hemoglobin 12.4; Platelets 53; Potassium 4.2; Sodium 136; TSH 2.280   Recent Lipid Panel Lab Results  Component Value Date/Time   CHOL 171 05/14/2019 08:11 AM   TRIG 217 (H) 05/14/2019 08:11 AM   HDL 45 05/14/2019 08:11 AM   CHOLHDL 3.8 05/14/2019 08:11 AM   LDLCALC 83 05/14/2019 08:11 AM    Wt Readings from Last 3 Encounters:  05/17/20 162 lb (73.5 kg)  04/26/20 164 lb (74.4 kg)  04/10/20 163 lb (73.9 kg)     Exam:    BP 140/66 (BP Location: Right Arm, Patient Position: Sitting, Cuff Size: Normal)   Pulse 70   Ht 5\' 4"  (1.626 m)   Wt 162 lb (73.5 kg)   BMI 27.81 kg/m  Constitutional:  oriented to person, place, and time. No distress.  HENT:  Head: Grossly normal Eyes:  no discharge. No scleral icterus.  Neck: No JVD, no carotid bruits  Cardiovascular: Regular rate and rhythm, no murmurs appreciated Pulmonary/Chest: Clear to auscultation bilaterally, no wheezes or rails Abdominal: Soft.  no  distension.  no tenderness.  Musculoskeletal: Normal range of motion Neurological:  normal muscle tone.  Coordination normal. No atrophy Skin: Skin warm and dry Psychiatric: normal affect, pleasant   ASSESSMENT & PLAN:    Aortic valve stenosis, etiology of cardiac valve disease unspecified  Long discussion concerning echocardiogram findings Continues to have moderate aortic valve stenosis, Impressive murmur on exam but she reports she is asymptomatic apart from mild shortness of breath on exertion -She is very sedentary, leg weakness/gait instability -Discussed various treatment options for aortic valve stenosis including open heart surgery, TAVR, medical management -Given her lack of symptoms, moderate degree of stenosis on echocardiogram, recommended we continue to monitor with echocardiogram on an annual basis or every 6 months for any worsening shortness of breath or chest pain symptoms -Given her age would likely not be a surgical candidate but might be a candidate for TAVR in the future We did discuss that cardiac catheterization would be needed prior to any intervention, risk and benefit discussed with her  Pulmonary hypertension ([place]) -  Recommend she continue Lasix 20 mg 3 times a week with potassium she is taking.  Renal function stable  Hyperlipidemia Recommend she stay on her Crestor Markedly elevated cholesterol in the past  Hypertension Blood pressure is well controlled on today's visit. No changes made to the medications.  Left bundle branch block Dates back several years  Long discussion with her concerning various treatment options for her aortic valve stenosis, symptoms to watch for including angina Discussed risk and benefit of procedures if needed for work-up of her aortic valve stenosis  Total encounter time more than 45 minutes  Greater than 50% was spent in counseling and coordination of care with the patient   Disposition: Follow-up in 12 months after  echocardiogram   Signed, Ida Rogue, MD  05/17/2020 9:21 AM    [place] [id number], [place], [place] [address]

## 2020-05-17 ENCOUNTER — Encounter: Payer: Self-pay | Admitting: Cardiovascular Disease

## 2020-05-17 ENCOUNTER — Ambulatory Visit: Payer: PPO | Admitting: Cardiovascular Disease

## 2020-05-17 ENCOUNTER — Other Ambulatory Visit: Payer: Self-pay

## 2020-05-17 VITALS — BP 140/66 | HR 70 | Ht 64.0 in | Wt 162.0 lb

## 2020-05-17 DIAGNOSIS — E1159 Type 2 diabetes mellitus with other circulatory complications: Secondary | ICD-10-CM

## 2020-05-17 DIAGNOSIS — E7800 Pure hypercholesterolemia, unspecified: Secondary | ICD-10-CM

## 2020-05-17 DIAGNOSIS — I272 Pulmonary hypertension, unspecified: Secondary | ICD-10-CM

## 2020-05-17 DIAGNOSIS — I10 Essential (primary) hypertension: Secondary | ICD-10-CM

## 2020-05-17 DIAGNOSIS — I350 Nonrheumatic aortic (valve) stenosis: Secondary | ICD-10-CM | POA: Diagnosis not present

## 2020-05-17 NOTE — Patient Instructions (Signed)

## 2020-05-18 ENCOUNTER — Telehealth: Payer: PPO

## 2020-05-18 ENCOUNTER — Ambulatory Visit: Payer: Self-pay

## 2020-05-18 NOTE — Chronic Care Management (AMB) (Signed)
  Chronic Care Management   Outreach Note  05/18/2020 Name: Jasmine Webster MRN: [id number] DOB: 07-Aug-1926  Primary Care Provider: Jerrol Banana., MD Reason for referral : Chronic Care Management    An unsuccessful telephone outreach was attempted today. [name] was referred to the case management team for assistance with care management and care coordination.     Follow Up Plan The care management team will reach out to [name] again within the next two to three weeks.   [facility] Care Management [phone]

## 2020-05-19 ENCOUNTER — Other Ambulatory Visit: Payer: Self-pay | Admitting: Family Medicine

## 2020-06-06 ENCOUNTER — Telehealth: Payer: PPO

## 2020-06-06 ENCOUNTER — Ambulatory Visit: Payer: Self-pay

## 2020-06-06 NOTE — Chronic Care Management (AMB) (Signed)
  Chronic Care Management   Outreach Note  06/06/2020 Name: Jasmine Webster MRN: [id number] DOB: [birth year]  Primary Care Provider: Jerrol Banana., MD Reason for referral : Chronic Care Management     Third unsuccessful telephone outreach was attempted today. Jasmine Webster was referred to the care management team for assistance with chronic care management and care coordination. Her primary care provider will be notified of our unsuccessful attempts to establish contact. The care management team will gladly outreach at any time in the future if she is interested in receiving assistance.   Follow Up Plan:  The care management team will gladly follow up with Jasmine Webster after the primary care provider has a conversation with her regarding recommendation for care management engagement and subsequent re-referral for care management services.    [facility] Care Management [phone]

## 2020-06-08 ENCOUNTER — Other Ambulatory Visit: Payer: Self-pay | Admitting: Family Medicine

## 2020-06-08 DIAGNOSIS — E118 Type 2 diabetes mellitus with unspecified complications: Secondary | ICD-10-CM

## 2020-06-15 ENCOUNTER — Encounter: Payer: Self-pay | Admitting: Podiatry

## 2020-06-15 ENCOUNTER — Ambulatory Visit: Payer: PPO | Admitting: Podiatry

## 2020-06-15 ENCOUNTER — Other Ambulatory Visit: Payer: Self-pay

## 2020-06-15 DIAGNOSIS — M79675 Pain in left toe(s): Secondary | ICD-10-CM

## 2020-06-15 DIAGNOSIS — E119 Type 2 diabetes mellitus without complications: Secondary | ICD-10-CM

## 2020-06-15 DIAGNOSIS — M79674 Pain in right toe(s): Secondary | ICD-10-CM

## 2020-06-15 DIAGNOSIS — B351 Tinea unguium: Secondary | ICD-10-CM | POA: Diagnosis not present

## 2020-06-15 NOTE — Progress Notes (Signed)
This patient returns to my office for at risk foot care.  This patient requires this care by a professional since this patient will be at risk due to having diabetes  This patient is unable to cut nails herself since the patient cannot reach her nails.These nails are painful walking and wearing shoes.  This patient presents for at risk foot care today.  General Appearance  Alert, conversant and in no acute stress.  Vascular  Dorsalis pedis and posterior tibial  pulses are palpable  bilaterally.  Capillary return is within normal limits  bilaterally. Temperature is within normal limits  bilaterally.  Neurologic  Senn-Weinstein monofilament wire test within normal limits  bilaterally. Muscle power within normal limits bilaterally.  Nails Thick disfigured discolored nails with subungual debris  from hallux to fifth toes bilaterally. No evidence of bacterial infection or drainage bilaterally.  Orthopedic  No limitations of motion  feet .  No crepitus or effusions noted.  HAV  B/L.  Hammer toes second  B/L.  Skin  normotropic skin with no porokeratosis noted bilaterally.  No signs of infections or ulcers noted.     Onychomycosis  Pain in right toes  Pain in left toes  Consent was obtained for treatment procedures.   Mechanical debridement of nails 1-5  bilaterally performed with a nail nipper.  Filed with dremel without incident.    Return office visit   3 months                   Told patient to return for periodic foot care and evaluation due to potential at risk complications.   Gardiner Barefoot DPM

## 2020-07-06 ENCOUNTER — Telehealth: Payer: Self-pay | Admitting: Cardiovascular Disease

## 2020-07-06 NOTE — Telephone Encounter (Signed)
Please call to discuss Lasix and Potassium, states that her PCP has increase how much she should take.

## 2020-07-06 NOTE — Telephone Encounter (Signed)
Left voicemail message to call back  

## 2020-07-06 NOTE — Telephone Encounter (Signed)
This encounter was created in error - please disregard.

## 2020-07-07 ENCOUNTER — Other Ambulatory Visit: Payer: Self-pay | Admitting: Cardiovascular Disease

## 2020-07-07 MED ORDER — POTASSIUM CHLORIDE ER 10 MEQ PO TBCR: 10.0000 meq | EXTENDED_RELEASE_TABLET | ORAL | 6 refills | Status: DC

## 2020-07-07 MED ORDER — FUROSEMIDE 20 MG PO TABS: 20.0000 mg | ORAL_TABLET | Freq: Every day | ORAL | 6 refills | Status: DC | PRN

## 2020-07-07 NOTE — Telephone Encounter (Signed)
I spoke with the patient.  She states Dr. Rockey Situ had instructed her to use lasix 2-3 times per week and to take potassium 10 meq on the days she uses her lasix.  Per the patient, she has been taking: 1) lasix 20 mg- once daily on Mondays-Wednesdays-Fridays (as this seems to help her the most)  2) potassium 10 meq- once daily except for saturdays (she increased her potassium 6 times/ week due to cramping and this seems to be helping)  The patient called today stating she is currently out of both prescriptions and needs these refilled.  She states she told Dr. Rosanna Randy how she was taking her medications and he was fine with this, but she is requesting the refills on lasix & potassium above from Dr. Rockey Situ as he prescribed this and she has run out of tablets.  I advised the patient I will review with Dr. Rockey Situ to make sure he ok with how she is taking both and then refill if he is agreeable.  The patient voices understanding and is agreeable.

## 2020-07-07 NOTE — Telephone Encounter (Signed)
Patient is returning your call.  

## 2020-07-11 NOTE — Telephone Encounter (Signed)
Spoke with patient and reviewed that we are waiting for Dr. Rockey Situ to approve. She is taking Furosemide 20 mg once a day on Monday/Wednesday/Friday and she takes Potassium 10 mEq 6 times per week. She needs refills and let her know that due to the changes then we need his approval. She verbalized understanding and told her that I would call once he reviews. She was agreeable with no further needs at this time.

## 2020-07-11 NOTE — Progress Notes (Signed)
Subjective:   Jasmine Webster is a 86 y.o. female who presents for Medicare Annual (Subsequent) preventive examination.  I connected with [name] today by telephone and verified that I am speaking with the correct person using two identifiers. Location patient: home Location provider: work Persons participating in the virtual visit: patient, provider.   I discussed the limitations, risks, security and privacy concerns of performing an evaluation and management service by telephone and the availability of in person appointments. I also discussed with the patient that there may be a patient responsible charge related to this service. The patient expressed understanding and verbally consented to this telephonic visit.    Interactive audio and video telecommunications were attempted between this provider and patient, however failed, due to patient having technical difficulties OR patient did not have access to video capability.  We continued and completed visit with audio only.   Review of Systems    N/A  Cardiac Risk Factors include: advanced age (>53men, >39 women)     Objective:    There were no vitals filed for this visit. There is no height or weight on file to calculate BMI.  Advanced Directives 07/12/2020 04/08/2020 03/28/2020 07/06/2018 12/03/2017 08/27/2017 07/04/2017  Does Patient Have a Medical Advance Directive? Yes No No Yes Yes Yes Yes  Type of Advance Directive Living will - - Living will [place];Living will [place];Living will [place];Living will  Copy of [place] in Chart? - - - - No - copy requested - No - copy requested    Current Medications (verified) Outpatient Encounter Medications as of 07/12/2020  Medication Sig  . ALPRAZolam (XANAX) 0.5 MG tablet TAKE 1 TABLET BY MOUTH EVERY DAY AT BEDTIME AS NEEDED  . Artificial Tear Ointment (DRY EYES OP) Apply 1 drop to eye daily.  Marland Kitchen  aspirin EC 81 MG tablet Take 81 mg by mouth daily.  . furosemide (LASIX) 20 MG tablet Take 1 tablet (20 mg total) by mouth daily as needed. Take 1 tablet (20 mg) 2-3 times a week  . glucose blood (ONE TOUCH ULTRA TEST) test strip Check sugar twice daily.  Marland Kitchen HYDROcodone-acetaminophen (NORCO/VICODIN) 5-325 MG tablet Take 1 tablet by mouth every 6 (six) hours as needed for moderate pain.  Marland Kitchen insulin glargine (LANTUS SOLOSTAR) 100 UNIT/ML Solostar Pen 90 day supply. (Patient taking differently: Inject 28-30 Units into the skin daily. 90 day supply.)  . Insulin Pen Needle (B-D ULTRAFINE III SHORT PEN) 31G X 8 MM MISC CHECK SUGAR 2 TIMES DAILY, DX E11.9 (NEEDS ULTRA FINE PEN NEEDLES)  . Insulin Pen Needle (FIFTY50 PEN NEEDLES) 31G X 8 MM MISC Check sugar 2 times daily, DX E11.9 (needs ultra fine pen needles)  . isosorbide mononitrate (IMDUR) 30 MG 24 hr tablet TAKE 1 TABLET BY MOUTH EVERY DAY  . Lancets (ONETOUCH ULTRASOFT) lancets Check sugar twice daily DX E11.9  . Multiple Vitamins-Minerals (PRESERVISION AREDS) CAPS Take 1 capsule by mouth 2 (two) times daily.   . nitroGLYCERIN (NITROSTAT) 0.4 MG SL tablet PLACE 1 TABLET (0.4 MG TOTAL) UNDER THE TONGUE EVERY 5 (FIVE) MINUTES AS NEEDED FOR CHEST PAIN.  Marland Kitchen Omega-3 Fatty Acids (FISH OIL PO) Take by mouth daily.  Marland Kitchen omeprazole (PRILOSEC) 20 MG capsule TAKE 1 CAPSULE BY MOUTH EVERY DAY  . ONETOUCH ULTRA test strip CHECK SUGAR TWICE A DAY  . potassium chloride (KLOR-CON) 10 MEQ tablet Take 1 tablet (10 mEq total) by mouth as directed.  Take 1 tablet (10 mEq) 2-3 times a week with furosemide.  . quinapril-hydrochlorothiazide (ACCURETIC) 20-12.5 MG tablet TAKE 1 TABLET BY MOUTH TWICE A DAY (Patient taking differently: Take 1 tablet by mouth daily. )  . rosuvastatin (CRESTOR) 5 MG tablet TAKE 1 TABLET BY MOUTH EVERY DAY  . gabapentin (NEURONTIN) 100 MG capsule 200 mg 2 (two) times daily.  (Patient not taking: Reported on 07/12/2020)   No facility-administered  encounter medications on file as of 07/12/2020.    Allergies (verified) Etodolac, Naproxen, Statins, Latex, Terbinafine, and Terbinafine hcl   History: Past Medical History:  Diagnosis Date  . Arthritis   . Cataract    cataract removal bilaterally approx 20 years ago  . Chronic back pain   . Coronary artery disease   . Diabetes mellitus without complication ([place])   . Heart murmur   . Hyperlipidemia   . Hypertension    Past Surgical History:  Procedure Laterality Date  . ABDOMINAL HYSTERECTOMY    . APPENDECTOMY    . BACK SURGERY     three  . CARDIAC CATHETERIZATION     6+yrs no stents Dr. Verneda Skill  . CATARACT EXTRACTION    . THORACOLUMBAR SYRINGO SHUNT    . TONSILLECTOMY AND ADENOIDECTOMY     Family History  Problem Relation Age of Onset  . Heart disease Mother        died from MI  . Heart attack Mother   . Heart disease Sister   . Sudden death Brother        shot and beaten to death during home invasion  . Stroke Sister   . Sudden death Sister        MVA  . Cancer Brother        died from lung cancer  . Sudden death Sister        91 days old  . Cancer Brother        died from lung cancer  . Stroke Brother        cause of death  . Diabetes Father   . Stroke Father   . Pneumonia Father    Social History   Socioeconomic History  . Marital status: Widowed    Spouse name: Not on file  . Number of children: 2  . Years of education: Not on file  . Highest education level: 8th grade  Occupational History  . Occupation: retired  Tobacco Use  . Smoking status: Never Smoker  . Smokeless tobacco: Never Used  Vaping Use  . Vaping Use: Never used  Substance and Sexual Activity  . Alcohol use: No    Alcohol/week: 0.0 standard drinks  . Drug use: No  . Sexual activity: Never  Other Topics Concern  . Not on file  Social History Narrative  . Not on file   Social Determinants of Health   Financial Resource Strain: Low Risk   . Difficulty of Paying  Living Expenses: Not hard at all  Food Insecurity: No Food Insecurity  . Worried About Charity fundraiser in the Last Year: Never true  . Ran Out of Food in the Last Year: Never true  Transportation Needs: No Transportation Needs  . Lack of Transportation (Medical): No  . Lack of Transportation (Non-Medical): No  Physical Activity: Inactive  . Days of Exercise per Week: 0 days  . Minutes of Exercise per Session: 0 min  Stress: No Stress Concern Present  . Feeling of Stress : Not at all  Social Connections: Moderately Isolated  . Frequency of Communication with Friends and Family: More than three times a week  . Frequency of Social Gatherings with Friends and Family: More than three times a week  . Attends Religious Services: More than 4 times per year  . Active Member of Clubs or Organizations: No  . Attends Archivist Meetings: Never  . Marital Status: Widowed    Tobacco Counseling Counseling given: Not Answered   Clinical Intake:  Pre-visit preparation completed: Yes  Pain : No/denies pain     Nutritional Risks: None Diabetes: Yes  How often do you need to have someone help you when you read instructions, pamphlets, or other written materials from your doctor or pharmacy?: 1 - Never  Diabetic? Yes  Nutrition Risk Assessment:  Has the patient had any N/V/D within the last 2 months?  No  Does the patient have any non-healing wounds?  No  Has the patient had any unintentional weight loss or weight gain?  No   Diabetes:  Is the patient diabetic?  Yes  If diabetic, was a CBG obtained today?  No  Did the patient bring in their glucometer from home?  No  How often do you monitor your CBG's? Every morning.   Financial Strains and Diabetes Management:  Are you having any financial strains with the device, your supplies or your medication? No .  Does the patient want to be seen by Chronic Care Management for management of their diabetes?  No  Would the  patient like to be referred to a Nutritionist or for Diabetic Management?  No   Diabetic Exams:  Diabetic Eye Exam: Overdue for diabetic eye exam. Pt has been advised about the importance in completing this exam. Patient advised to call and schedule an eye exam. Pt declined scheduling an eye exam until she "needs something." Diabetic Foot Exam: Completed 03/16/20   Interpreter Needed?: No  Information entered by :: MMarkoski, LPN   Activities of Daily Living In your present state of health, do you have any difficulty performing the following activities: 07/12/2020  Hearing? N  Vision? Y  Comment Due to wet MD.  Difficulty concentrating or making decisions? N  Walking or climbing stairs? Y  Comment Due to SOB from exertion.  Dressing or bathing? N  Doing errands, shopping? Y  Comment Does not drive.  Preparing Food and eating ? N  Using the Toilet? N  In the past six months, have you accidently leaked urine? N  Do you have problems with loss of bowel control? N  Managing your Medications? N  Managing your Finances? N  Housekeeping or managing your Housekeeping? N  Some recent data might be hidden    Patient Care Team: Jerrol Banana., MD as PCP - General (Family Medicine) Rockey Situ, Kathlene November, MD as PCP - Cardiology (Cardiology) Birder Robson, MD as Referring Physician (Ophthalmology) Neldon Labella, RN as Case Manager Gardiner Barefoot, DPM as Consulting Physician (Podiatry)  Indicate any recent Medical Services you may have received from other than [facility] providers in the past year (date may be approximate).     Assessment:   This is a routine wellness examination for [place].  Hearing/Vision screen No exam data present  Dietary issues and exercise activities discussed: Current Exercise Habits: The patient does not participate in regular exercise at present, Exercise limited by: cardiac condition(s)  Goals    . Prevent falls     Recommend to remove any items  from the  home that may cause slips or trips.      Depression Screen PHQ 2/9 Scores 07/12/2020 07/06/2018 12/03/2017 11/03/2017 10/06/2017 08/27/2017 07/04/2017  PHQ - 2 Score 0 0 0 0 0 0 0  Exception Documentation - - - - - - -    Fall Risk Fall Risk  07/12/2020 10/13/2019 07/06/2018 12/03/2017 11/03/2017  Falls in the past year? 1 0 No No No  Comment - Emmi Telephone Survey: data to providers prior to load - - -  Number falls in past yr: 0 - - - -  Injury with Fall? 0 - - - -  Follow up Falls prevention discussed - - - -    Any stairs in or around the home? Yes  If so, are there any without handrails? No  Home free of loose throw rugs in walkways, pet beds, electrical cords, etc? Yes  Adequate lighting in your home to reduce risk of falls? Yes   ASSISTIVE DEVICES UTILIZED TO PREVENT FALLS:  Life alert? Yes  Use of a cane, walker or w/c? Yes  Grab bars in the bathroom? Yes  Shower chair or bench in shower? Yes  Elevated toilet seat or a handicapped toilet? Yes    Cognitive Function: Declined today.      6CIT Screen 09/06/2016  What Year? 0 points  What month? 0 points  What time? 0 points  Count back from 20 0 points  Months in reverse 4 points  Repeat phrase 6 points  Total Score 10    Immunizations Immunization History  Administered Date(s) Administered  . Influenza, High Dose Seasonal PF 09/04/2015, 08/29/2016, 08/02/2018, 08/12/2019  . Influenza-Unspecified 09/18/2013, 08/16/2017  . PFIZER SARS-COV-2 Vaccination 12/02/2019, 12/23/2019  . Pneumococcal Conjugate-13 12/27/2014  . Pneumococcal Polysaccharide-23 07/13/1998  . Td 03/29/2010    TDAP status: Due, Education has been provided regarding the importance of this vaccine. Advised may receive this vaccine at local pharmacy or Health Dept. Aware to provide a copy of the vaccination record if obtained from local pharmacy or Health Dept. Verbalized acceptance and understanding. Flu Vaccine status: Due fall  2021 Pneumococcal vaccine status: Up to date Covid-19 vaccine status: Completed vaccines  Qualifies for Shingles Vaccine? Yes   Zostavax completed No   Shingrix Completed?: No.    Education has been provided regarding the importance of this vaccine. Patient has been advised to call insurance company to determine out of pocket expense if they have not yet received this vaccine. Advised may also receive vaccine at local pharmacy or Health Dept. Verbalized acceptance and understanding.  Screening Tests Health Maintenance  Topic Date Due  . OPHTHALMOLOGY EXAM  06/26/2019  . INFLUENZA VACCINE  06/18/2020  . TETANUS/TDAP  07/12/2021 (Originally 03/29/2020)  . HEMOGLOBIN A1C  10/11/2020  . FOOT EXAM  03/16/2021  . DEXA SCAN  Completed  . COVID-19 Vaccine  Completed  . PNA vac Low Risk Adult  Completed    Health Maintenance  Health Maintenance Due  Topic Date Due  . OPHTHALMOLOGY EXAM  06/26/2019  . INFLUENZA VACCINE  06/18/2020    Colorectal cancer screening: No longer required.  Mammogram status: No longer required.  Bone Density status: Completed 12/03/10. Results reflect: Bone density results: NORMAL.   Lung Cancer Screening: (Low Dose CT Chest recommended if Age 33-80 years, 30 pack-year currently smoking OR have quit w/in 15years.) does not qualify.   Additional Screening:  Vision Screening: Recommended annual ophthalmology exams for early detection of glaucoma and other disorders of the eye. Is  the patient up to date with their annual eye exam?  Yes  Who is the provider or what is the name of the office in which the patient attends annual eye exams? Dr George Ina @ [place] If pt is not established with a provider, would they like to be referred to a provider to establish care? No .   Dental Screening: Recommended annual dental exams for proper oral hygiene  Community Resource Referral / Chronic Care Management: CRR required this visit?  No   CCM required this visit?  No       Plan:     I have personally reviewed and noted the following in the patient's chart:   . Medical and social history . Use of alcohol, tobacco or illicit drugs  . Current medications and supplements . Functional ability and status . Nutritional status . Physical activity . Advanced directives . List of other physicians . Hospitalizations, surgeries, and ER visits in previous 12 months . Vitals . Screenings to include cognitive, depression, and falls . Referrals and appointments  In addition, I have reviewed and discussed with patient certain preventive protocols, quality metrics, and best practice recommendations. A written personalized care plan for preventive services as well as general preventive health recommendations were provided to patient.     Daking Westervelt [place], [place]   [id number]/[id number]   Nurse Notes: Pt needs a flu shot at next in office apt. Pt declined scheduling an eye exam at this time.

## 2020-07-11 NOTE — Telephone Encounter (Signed)
Patient calling for update.

## 2020-07-12 ENCOUNTER — Ambulatory Visit (INDEPENDENT_AMBULATORY_CARE_PROVIDER_SITE_OTHER): Payer: PPO

## 2020-07-12 ENCOUNTER — Other Ambulatory Visit: Payer: Self-pay

## 2020-07-12 DIAGNOSIS — Z0000 Encounter for general adult medical examination without abnormal findings: Secondary | ICD-10-CM

## 2020-07-12 NOTE — Patient Instructions (Signed)
[name] , Thank you for taking time to come for your Medicare Wellness Visit. I appreciate your ongoing commitment to your health goals. Please review the following plan we discussed and let me know if I can assist you in the future.   Screening recommendations/referrals: Colonoscopy: No longer required.  Mammogram: No longer required.  Bone Density: Previous DEXA scan was normal. No repeat needed unless advised by a physician. Recommended yearly ophthalmology/optometry visit for glaucoma screening and checkup Recommended yearly dental visit for hygiene and checkup  Vaccinations: Influenza vaccine: Due fall 2021 Pneumococcal vaccine: Completed series Tdap vaccine: Currently due, declined at this time.  Shingles vaccine: Shingrix discussed. Please contact your pharmacy for coverage information.     Advanced directives: Living Will on file.  Conditions/risks identified: Fall risk preventatives discussed today.   Next appointment: 07/27/20 @ 3:40 PM with Dr Rosanna Randy    Preventive Care 53 Years and Older, Female Preventive care refers to lifestyle choices and visits with your health care provider that can promote health and wellness. What does preventive care include?  A yearly physical exam. This is also called an annual well check.  Dental exams once or twice a year.  Routine eye exams. Ask your health care provider how often you should have your eyes checked.  Personal lifestyle choices, including:  Daily care of your teeth and gums.  Regular physical activity.  Eating a healthy diet.  Avoiding tobacco and drug use.  Limiting alcohol use.  Practicing safe sex.  Taking low-dose aspirin every day.  Taking vitamin and mineral supplements as recommended by your health care provider. What happens during an annual well check? The services and screenings done by your health care provider during your annual well check will depend on your age, overall health, lifestyle risk  factors, and family history of disease. Counseling  Your health care provider may ask you questions about your:  Alcohol use.  Tobacco use.  Drug use.  Emotional well-being.  Home and relationship well-being.  Sexual activity.  Eating habits.  History of falls.  Memory and ability to understand (cognition).  Work and work Statistician.  Reproductive health. Screening  You may have the following tests or measurements:  Height, weight, and BMI.  Blood pressure.  Lipid and cholesterol levels. These may be checked every 5 years, or more frequently if you are over 46 years old.  Skin check.  Lung cancer screening. You may have this screening every year starting at age 30 if you have a 30-pack-year history of smoking and currently smoke or have quit within the past 15 years.  Fecal occult blood test (FOBT) of the stool. You may have this test every year starting at age 47.  Flexible sigmoidoscopy or colonoscopy. You may have a sigmoidoscopy every 5 years or a colonoscopy every 10 years starting at age 72.  Hepatitis C blood test.  Hepatitis B blood test.  Sexually transmitted disease (STD) testing.  Diabetes screening. This is done by checking your blood sugar (glucose) after you have not eaten for a while (fasting). You may have this done every 1-3 years.  Bone density scan. This is done to screen for osteoporosis. You may have this done starting at age 84.  Mammogram. This may be done every 1-2 years. Talk to your health care provider about how often you should have regular mammograms. Talk with your health care provider about your test results, treatment options, and if necessary, the need for more tests. Vaccines  Your health  care provider may recommend certain vaccines, such as:  Influenza vaccine. This is recommended every year.  Tetanus, diphtheria, and acellular pertussis (Tdap, Td) vaccine. You may need a Td booster every 10 years.  Zoster vaccine. You  may need this after age 76.  Pneumococcal 13-valent conjugate (PCV13) vaccine. One dose is recommended after age 10.  Pneumococcal polysaccharide (PPSV23) vaccine. One dose is recommended after age 51. Talk to your health care provider about which screenings and vaccines you need and how often you need them. This information is not intended to replace advice given to you by your health care provider. Make sure you discuss any questions you have with your health care provider. Document Released: 12/01/2015 Document Revised: 07/24/2016 Document Reviewed: 09/05/2015 Elsevier Interactive Patient Education  2017 [place] Prevention in the Home Falls can cause injuries. They can happen to people of all ages. There are many things you can do to make your home safe and to help prevent falls. What can I do on the outside of my home?  Regularly fix the edges of walkways and driveways and fix any cracks.  Remove anything that might make you trip as you walk through a door, such as a raised step or threshold.  Trim any bushes or trees on the path to your home.  Use bright outdoor lighting.  Clear any walking paths of anything that might make someone trip, such as rocks or tools.  Regularly check to see if handrails are loose or broken. Make sure that both sides of any steps have handrails.  Any raised decks and porches should have guardrails on the edges.  Have any leaves, snow, or ice cleared regularly.  Use sand or salt on walking paths during winter.  Clean up any spills in your garage right away. This includes oil or grease spills. What can I do in the bathroom?  Use night lights.  Install grab bars by the toilet and in the tub and shower. Do not use towel bars as grab bars.  Use non-skid mats or decals in the tub or shower.  If you need to sit down in the shower, use a plastic, non-slip stool.  Keep the floor dry. Clean up any water that spills on the floor as soon as  it happens.  Remove soap buildup in the tub or shower regularly.  Attach bath mats securely with double-sided non-slip rug tape.  Do not have throw rugs and other things on the floor that can make you trip. What can I do in the bedroom?  Use night lights.  Make sure that you have a light by your bed that is easy to reach.  Do not use any sheets or blankets that are too big for your bed. They should not hang down onto the floor.  Have a firm chair that has side arms. You can use this for support while you get dressed.  Do not have throw rugs and other things on the floor that can make you trip. What can I do in the kitchen?  Clean up any spills right away.  Avoid walking on wet floors.  Keep items that you use a lot in easy-to-reach places.  If you need to reach something above you, use a strong step stool that has a grab bar.  Keep electrical cords out of the way.  Do not use floor polish or wax that makes floors slippery. If you must use wax, use non-skid floor wax.  Do not have  throw rugs and other things on the floor that can make you trip. What can I do with my stairs?  Do not leave any items on the stairs.  Make sure that there are handrails on both sides of the stairs and use them. Fix handrails that are broken or loose. Make sure that handrails are as long as the stairways.  Check any carpeting to make sure that it is firmly attached to the stairs. Fix any carpet that is loose or worn.  Avoid having throw rugs at the top or bottom of the stairs. If you do have throw rugs, attach them to the floor with carpet tape.  Make sure that you have a light switch at the top of the stairs and the bottom of the stairs. If you do not have them, ask someone to add them for you. What else can I do to help prevent falls?  Wear shoes that:  Do not have high heels.  Have rubber bottoms.  Are comfortable and fit you well.  Are closed at the toe. Do not wear sandals.  If  you use a stepladder:  Make sure that it is fully opened. Do not climb a closed stepladder.  Make sure that both sides of the stepladder are locked into place.  Ask someone to hold it for you, if possible.  Clearly mark and make sure that you can see:  Any grab bars or handrails.  First and last steps.  Where the edge of each step is.  Use tools that help you move around (mobility aids) if they are needed. These include:  Canes.  Walkers.  Scooters.  Crutches.  Turn on the lights when you go into a dark area. Replace any light bulbs as soon as they burn out.  Set up your furniture so you have a clear path. Avoid moving your furniture around.  If any of your floors are uneven, fix them.  If there are any pets around you, be aware of where they are.  Review your medicines with your doctor. Some medicines can make you feel dizzy. This can increase your chance of falling. Ask your doctor what other things that you can do to help prevent falls. This information is not intended to replace advice given to you by your health care provider. Make sure you discuss any questions you have with your health care provider. Document Released: 08/31/2009 Document Revised: 04/11/2016 Document Reviewed: 12/09/2014 Elsevier Interactive Patient Education  2017 [employer].

## 2020-07-13 ENCOUNTER — Other Ambulatory Visit: Payer: Self-pay

## 2020-07-13 ENCOUNTER — Telehealth: Payer: Self-pay | Admitting: Cardiovascular Disease

## 2020-07-13 ENCOUNTER — Emergency Department
Admission: EM | Admit: 2020-07-13 | Discharge: 2020-07-13 | Disposition: A | Discharge status: Home / Self Care | Payer: PPO | Attending: Emergency Medicine | Admitting: Emergency Medicine

## 2020-07-13 ENCOUNTER — Emergency Department: Payer: PPO

## 2020-07-13 DIAGNOSIS — E039 Hypothyroidism, unspecified: Secondary | ICD-10-CM | POA: Diagnosis not present

## 2020-07-13 DIAGNOSIS — I350 Nonrheumatic aortic (valve) stenosis: Secondary | ICD-10-CM | POA: Diagnosis not present

## 2020-07-13 DIAGNOSIS — R5383 Other fatigue: Secondary | ICD-10-CM | POA: Diagnosis not present

## 2020-07-13 DIAGNOSIS — Z91040 Latex allergy status: Secondary | ICD-10-CM | POA: Insufficient documentation

## 2020-07-13 DIAGNOSIS — I10 Essential (primary) hypertension: Secondary | ICD-10-CM | POA: Insufficient documentation

## 2020-07-13 DIAGNOSIS — R531 Weakness: Secondary | ICD-10-CM | POA: Diagnosis not present

## 2020-07-13 DIAGNOSIS — Z79899 Other long term (current) drug therapy: Secondary | ICD-10-CM | POA: Diagnosis not present

## 2020-07-13 DIAGNOSIS — R42 Dizziness and giddiness: Secondary | ICD-10-CM | POA: Insufficient documentation

## 2020-07-13 DIAGNOSIS — Z7982 Long term (current) use of aspirin: Secondary | ICD-10-CM | POA: Insufficient documentation

## 2020-07-13 DIAGNOSIS — E119 Type 2 diabetes mellitus without complications: Secondary | ICD-10-CM | POA: Insufficient documentation

## 2020-07-13 DIAGNOSIS — Z794 Long term (current) use of insulin: Secondary | ICD-10-CM | POA: Diagnosis not present

## 2020-07-13 DIAGNOSIS — I2510 Atherosclerotic heart disease of native coronary artery without angina pectoris: Secondary | ICD-10-CM | POA: Insufficient documentation

## 2020-07-13 LAB — COMPREHENSIVE METABOLIC PANEL
ALT: 18 U/L (ref 0–44)
AST: 28 U/L (ref 15–41)
Albumin: 4.2 g/dL (ref 3.5–5.0)
Alkaline Phosphatase: 97 U/L (ref 38–126)
Anion gap: 13 (ref 5–15)
BUN: 22 mg/dL (ref 8–23)
CO2: 26 mmol/L (ref 22–32)
Calcium: 9.4 mg/dL (ref 8.9–10.3)
Chloride: 100 mmol/L (ref 98–111)
Creatinine, Ser: 1.17 mg/dL — ABNORMAL HIGH (ref 0.44–1.00)
GFR calc Af Amer: 47 mL/min — ABNORMAL LOW (ref >60)
GFR calc non Af Amer: 40 mL/min — ABNORMAL LOW (ref >60)
Glucose, Bld: 77 mg/dL (ref 70–99)
Potassium: 4 mmol/L (ref 3.5–5.1)
Sodium: 139 mmol/L (ref 135–145)
Total Bilirubin: 1 mg/dL (ref 0.3–1.2)
Total Protein: 6.8 g/dL (ref 6.5–8.1)

## 2020-07-13 LAB — URINALYSIS, COMPLETE (UACMP) WITH MICROSCOPIC
Bacteria, UA: NONE SEEN
Bilirubin Urine: NEGATIVE
Glucose, UA: NEGATIVE mg/dL
Hgb urine dipstick: NEGATIVE
Ketones, ur: NEGATIVE mg/dL
Leukocytes,Ua: NEGATIVE
Nitrite: NEGATIVE
Protein, ur: NEGATIVE mg/dL
Specific Gravity, Urine: 1.013 (ref 1.005–1.030)
Squamous Epithelial / HPF: NONE SEEN (ref 0–5)
pH: 5 (ref 5.0–8.0)

## 2020-07-13 LAB — CBC WITH DIFFERENTIAL/PLATELET
Abs Immature Granulocytes: 0.06 10*3/uL (ref 0.00–0.07)
Basophils Absolute: 0.1 10*3/uL (ref 0.0–0.1)
Basophils Relative: 1 %
Eosinophils Absolute: 0.1 10*3/uL (ref 0.0–0.5)
Eosinophils Relative: 1 %
HCT: 39.3 % (ref 36.0–46.0)
Hemoglobin: 12.6 g/dL (ref 12.0–15.0)
Immature Granulocytes: 1 %
Lymphocytes Relative: 14 %
Lymphs Abs: 1.7 10*3/uL (ref 0.7–4.0)
MCH: 31.6 pg (ref 26.0–34.0)
MCHC: 32.1 g/dL (ref 30.0–36.0)
MCV: 98.5 fL (ref 80.0–100.0)
Monocytes Absolute: 1.3 10*3/uL — ABNORMAL HIGH (ref 0.1–1.0)
Monocytes Relative: 11 %
Neutro Abs: 9 10*3/uL — ABNORMAL HIGH (ref 1.7–7.7)
Neutrophils Relative %: 72 %
Platelets: 173 10*3/uL (ref 150–400)
RBC: 3.99 MIL/uL (ref 3.87–5.11)
RDW: 13.3 % (ref 11.5–15.5)
WBC: 12.2 10*3/uL — ABNORMAL HIGH (ref 4.0–10.5)
nRBC: 0 % (ref 0.0–0.2)

## 2020-07-13 LAB — BRAIN NATRIURETIC PEPTIDE: B Natriuretic Peptide: 90.4 pg/mL (ref 0.0–100.0)

## 2020-07-13 LAB — TROPONIN I (HIGH SENSITIVITY): Troponin I (High Sensitivity): 8 ng/L (ref <18)

## 2020-07-13 MED ORDER — ISOSORBIDE MONONITRATE ER 60 MG PO TB24
30.0000 mg | ORAL_TABLET | Freq: Once | ORAL | Status: AC
  Administered 2020-07-13: 30 mg via ORAL
  Filled 2020-07-13: qty 1

## 2020-07-13 MED ORDER — ISOSORBIDE MONONITRATE ER 30 MG PO TB24
30.0000 mg | ORAL_TABLET | Freq: Two times a day (BID) | ORAL | 0 refills | Status: DC
Start: 2020-07-13 — End: 2020-08-03

## 2020-07-13 NOTE — Discharge Instructions (Addendum)
Dr. Donivan Scull office should call you in the morning. If you haven't heard from him by lunch, call the office to set up follow-up  For now, we are going to try increasing your Imdur 30 mg tablet from once a day to twice a day (AM and PM, ideally approx 12 hr apart).  You can start this TOMORROW (first dose was given here).  Keep a log of your blood pressures and pulse  Be very careful going from sitting to standing

## 2020-07-13 NOTE — ED Notes (Signed)
Pt wheeled to lobby and helped to car

## 2020-07-13 NOTE — ED Triage Notes (Addendum)
Pt states that she felt fine this morning when she woke up and ate breakfast- pt states she got up to get some water and started not feeling right- pt states she was having some "feelings" in her chest, but not pain- pt states she collapsed in her chair and her son brought her here- pt denies pain anywhere but states she "does not feel right"- pt denies LOC

## 2020-07-13 NOTE — Telephone Encounter (Signed)
Spoke with patients daughter [name]. She was very upset that she is in the ER with her mother right now, as her mother is experiencing "chest pressure".  She stated that her mother has been out of her Lasix for over a week now and this is the 'first time she has felt like she might lose her mother'.   She wanted to know why the office has not refilled her prescription as her mother had called over week ago requesting this.  She asked that her questions and concerns be forwarded to Dr. Rockey Situ.

## 2020-07-13 NOTE — Telephone Encounter (Signed)
Patients daughter calling in stating patient is in ED at [facility] with chest heaviness and has been waiting for over a week for an order for lasix. Patients daughter not sure if Dr. Rockey Situ would be able to call in lasix or anything to ED doctors. Patient took 3 nitros this morning before going to ED and also was not able to walk  Please advise

## 2020-07-13 NOTE — ED Provider Notes (Signed)
[facility] Emergency Department Provider Note  ____________________________________________   First MD Initiated Contact with Patient 07/13/20 1658     (approximate)  I have reviewed the triage vital signs and the nursing notes.   HISTORY  Chief Complaint Weakness    HPI Jasmine Webster is a 86 y.o. female  Here with near syncopal episode. Pt states that she woke up this AM and felt "great."  She was getting things ready around the house when she stood up to walk to the other room. She began to feel acutely very weak and lightheaded. She felt a mild pressure like feeling in her chest though not overtly painful. No palpitations. She tried to rest but her sx persisted for at least several minutes. She did not actually lose consciousness. Since arriving to the ED, sx have resolved. No recent illness. No SOB, cough. No sputum production.       Past Medical History:  Diagnosis Date  . Arthritis   . Cataract    cataract removal bilaterally approx 20 years ago  . Chronic back pain   . Coronary artery disease   . Diabetes mellitus without complication ([place])   . Heart murmur   . Hyperlipidemia   . Hypertension     Patient Active Problem List   Diagnosis Date Noted  . Diabetes mellitus without complication ([place]) 84/16/6063  . L-S radiculopathy 01/15/2019  . Angina pectoris ([place]) 12/28/2018  . Mixed hyperlipidemia 12/28/2018  . Arthritis 03/23/2015  . Cardiovascular disease 03/23/2015  . Urinary system disease 03/23/2015  . Malignant neoplasm of corpus uteri ([place]) 03/23/2015  . Benign essential HTN 03/23/2015  . Acid reflux 03/23/2015  . Cardiac murmur 03/23/2015  . Hypercholesteremia 03/23/2015  . Adult hypothyroidism 03/23/2015  . Cannot sleep 03/23/2015  . Malaise and fatigue 03/23/2015  . Muscle ache 03/23/2015  . Arthritis, degenerative 03/23/2015  . Diabetes mellitus type II, controlled ([place]) 03/23/2015    Past Surgical History:    Procedure Laterality Date  . ABDOMINAL HYSTERECTOMY    . APPENDECTOMY    . BACK SURGERY     three  . CARDIAC CATHETERIZATION     6+yrs no stents Dr. Verneda Skill  . CATARACT EXTRACTION    . THORACOLUMBAR [place]      Prior to Admission medications   Medication Sig Start Date End Date Taking? Authorizing Provider  ALPRAZolam Duanne Moron) 0.5 MG tablet TAKE 1 TABLET BY MOUTH EVERY DAY AT BEDTIME AS NEEDED 03/06/20   Jerrol Banana., MD  Artificial Tear Ointment (DRY EYES OP) Apply 1 drop to eye daily.    [provider]  aspirin EC 81 MG tablet Take 81 mg by mouth daily.    [provider]  furosemide (LASIX) 20 MG tablet Take 1 tablet (20 mg total) by mouth daily as needed. Take 1 tablet (20 mg) 2-3 times a week 07/07/20   Minna Merritts, MD  gabapentin (NEURONTIN) 100 MG capsule 200 mg 2 (two) times daily.  Patient not taking: Reported on 07/12/2020 11/15/16   [provider]  glucose blood (ONE TOUCH ULTRA TEST) test strip Check sugar twice daily. 06/22/19   Jerrol Banana., MD  HYDROcodone-acetaminophen (NORCO/VICODIN) 5-325 MG tablet Take 1 tablet by mouth every 6 (six) hours as needed for moderate pain.    [provider]  insulin glargine (LANTUS SOLOSTAR) 100 UNIT/ML Solostar Pen 90 day supply. Patient taking differently: Inject 28-30 Units  into the skin daily. 90 day supply. 04/10/20   Jerrol Banana., MD  Insulin Pen Needle (B-D ULTRAFINE III SHORT PEN) 31G X 8 MM MISC CHECK SUGAR 2 TIMES DAILY, DX E11.9 (NEEDS ULTRA FINE PEN NEEDLES) 12/17/19   Jerrol Banana., MD  Insulin Pen Needle (FIFTY50 PEN NEEDLES) 31G X 8 MM MISC Check sugar 2 times daily, DX E11.9 (needs ultra fine pen needles) 02/19/16   [provider]  isosorbide mononitrate (IMDUR) 30 MG 24 hr tablet Take 1 tablet (30 mg total) by mouth in the morning and at bedtime. 07/13/20   Duffy Bruce, MD  Lancets  [facility] ULTRASOFT) lancets Check sugar twice daily DX E11.9 04/21/17   Jerrol Banana., MD  Multiple Vitamins-Minerals (PRESERVISION AREDS) CAPS Take 1 capsule by mouth 2 (two) times daily.     [provider]  nitroGLYCERIN (NITROSTAT) 0.4 MG SL tablet PLACE 1 TABLET (0.4 MG TOTAL) UNDER THE TONGUE EVERY 5 (FIVE) MINUTES AS NEEDED FOR CHEST PAIN. 04/12/19   Jerrol Banana., MD  Omega-3 Fatty Acids (FISH OIL PO) Take by mouth daily.    [provider]  omeprazole (PRILOSEC) 20 MG capsule TAKE 1 CAPSULE BY MOUTH EVERY DAY 10/07/19   Jerrol Banana., MD  [facility] ULTRA test strip CHECK SUGAR TWICE A DAY 06/08/20   Jerrol Banana., MD  potassium chloride (KLOR-CON) 10 MEQ tablet Take 1 tablet (10 mEq total) by mouth as directed. Take 1 tablet (10 mEq) 2-3 times a week with furosemide. 07/07/20   Minna Merritts, MD  quinapril-hydrochlorothiazide (ACCURETIC) 20-12.5 MG tablet TAKE 1 TABLET BY MOUTH TWICE A DAY Patient taking differently: Take 1 tablet by mouth daily.  03/14/20   Jerrol Banana., MD  rosuvastatin (CRESTOR) 5 MG tablet TAKE 1 TABLET BY MOUTH EVERY DAY 05/19/20   Jerrol Banana., MD    Allergies Etodolac, Naproxen, Statins, Latex, Terbinafine, and Terbinafine hcl  Family History  Problem Relation Age of Onset  . Heart disease Mother        died from MI  . Heart attack Mother   . Heart disease Sister   . Sudden death Brother        shot and beaten to death during home invasion  . Stroke Sister   . Sudden death Sister        MVA  . Cancer Brother        died from lung cancer  . Sudden death Sister        23 days old  . Cancer Brother        died from lung cancer  . Stroke Brother        cause of death  . Diabetes Father   . Stroke Father   . Pneumonia Father     Social History Social History   Tobacco Use  . Smoking status: Never Smoker  . Smokeless tobacco: Never Used  Vaping Use  . Vaping Use: Never used    Substance Use Topics  . Alcohol use: No    Alcohol/week: 0.0 standard drinks  . Drug use: No    Review of Systems  Review of Systems  Constitutional: Positive for fatigue. Negative for fever.  HENT: Negative for congestion and sore throat.   Eyes: Negative for visual disturbance.  Respiratory: Negative for cough and shortness of breath.   Cardiovascular: Negative for chest pain.  Gastrointestinal: Negative for abdominal pain, diarrhea, nausea and  vomiting.  Genitourinary: Negative for flank pain.  Musculoskeletal: Negative for back pain and neck pain.  Skin: Negative for rash and wound.  Neurological: Positive for light-headedness. Negative for weakness.  All other systems reviewed and are negative.    ____________________________________________  PHYSICAL EXAM:      VITAL SIGNS: ED Triage Vitals  Enc Vitals Group     BP 07/13/20 1158 (!) 137/55     Pulse Rate 07/13/20 1158 85     Resp 07/13/20 1158 18     Temp 07/13/20 1158 98 F (36.7 C)     Temp Source 07/13/20 1158 Oral     SpO2 07/13/20 1158 99 %     Weight 07/13/20 1146 161 lb (73 kg)     Height 07/13/20 1146 5\' 4"  (1.626 m)     Head Circumference --      Peak Flow --      Pain Score 07/13/20 1145 0     Pain Loc --      Pain Edu? --      Excl. in [place]? --      Physical Exam Vitals and nursing note reviewed.  Constitutional:      General: She is not in acute distress.    Appearance: She is well-developed.  HENT:     Head: Normocephalic and atraumatic.  Eyes:     Conjunctiva/sclera: Conjunctivae normal.  Cardiovascular:     Rate and Rhythm: Normal rate and regular rhythm.     Heart sounds: Murmur heard.  Crescendo decrescendo systolic murmur is present with a grade of 3/6.  No friction rub.  Pulmonary:     Effort: Pulmonary effort is normal. No respiratory distress.     Breath sounds: Normal breath sounds. No wheezing or rales.  Abdominal:     General: There is no distension.     Palpations:  Abdomen is soft.     Tenderness: There is no abdominal tenderness.  Musculoskeletal:     Cervical back: Neck supple.  Skin:    General: Skin is warm.     Capillary Refill: Capillary refill takes less than 2 seconds.  Neurological:     Mental Status: She is alert and oriented to person, place, and time.     Motor: No abnormal muscle tone.       ____________________________________________   LABS (all labs ordered are listed, but only abnormal results are displayed)  Labs Reviewed  URINALYSIS, COMPLETE (UACMP) WITH MICROSCOPIC - Abnormal; Notable for the following components:      Result Value   Color, Urine YELLOW (*)    APPearance CLEAR (*)    All other components within normal limits  CBC WITH DIFFERENTIAL/PLATELET - Abnormal; Notable for the following components:   WBC 12.2 (*)    Neutro Abs 9.0 (*)    Monocytes Absolute 1.3 (*)    All other components within normal limits  COMPREHENSIVE METABOLIC PANEL - Abnormal; Notable for the following components:   Creatinine, Ser 1.17 (*)    GFR calc non Af Amer 40 (*)    GFR calc Af Amer 47 (*)    All other components within normal limits  BRAIN NATRIURETIC PEPTIDE  TROPONIN I (HIGH SENSITIVITY)    ____________________________________________  EKG: Normal sinus rhythm, VR 83. PR 162, QRS 132, QTc 474. LBBB, LAD. No acute ST elevations. No Sgarbossa criteria. ________________________________________  RADIOLOGY All imaging, including plain films, CT scans, and ultrasounds, independently reviewed by me, and interpretations confirmed via formal radiology reads.  ED MD interpretation:   CXR: Clear  Official radiology report(s): DG Chest 2 View  Result Date: 07/13/2020 CLINICAL DATA:  Feels weak, funny feeling in chest EXAM: CHEST - 2 VIEW COMPARISON:  04/08/2020 FINDINGS: No new consolidation or edema. No pleural effusion or pneumothorax. Stable cardiomediastinal contours with normal heart size. No acute osseous abnormality.  IMPRESSION: No acute process in the chest. Electronically Signed   By: Macy Mis M.D.   On: 07/13/2020 15:59    ____________________________________________  PROCEDURES   Procedure(s) performed (including Critical Care):  Procedures  ____________________________________________  INITIAL IMPRESSION / MDM / [place] / ED COURSE  As part of my medical decision making, I reviewed the following data within the Central notes reviewed and incorporated, Old chart reviewed, Notes from prior ED visits, and [place] Controlled Substance Database       *Jasmine Webster was evaluated in Emergency Department on 07/13/2020 for the symptoms described in the history of present illness. She was evaluated in the context of the global COVID-19 pandemic, which necessitated consideration that the patient might be at risk for infection with the SARS-CoV-2 virus that causes COVID-19. Institutional protocols and algorithms that pertain to the evaluation of patients at risk for COVID-19 are in a state of rapid change based on information released by regulatory bodies including the CDC and federal and state organizations. These policies and algorithms were followed during the patient's care in the ED.  Some ED evaluations and interventions may be delayed as a result of limited staffing during the pandemic.*     Medical Decision Making:  86 yo F here with near syncopal episode after standing. Suspect symptomatic AS based on her significant gradient and findings on recent echo. My suspicion is she is increasingly volume-dependent and had some mild orthostasis/drop in pressure. She is now asymptomatic. Her labs, including troponin, are unremarkable. CXr is clear w/o edema. Sats are >95% on RA. No ectopy on telemetry and EKG is nonischemic with no Sgarbossa criteria. Lytes acceptable. Discussed with Dr. Rockey Situ. Will increase her imdur given her SBP 190s here with 140-150s at home to  help w/ afterload, and have her f/u inc linic in next several days. Return precautions given.  ____________________________________________  FINAL CLINICAL IMPRESSION(S) / ED DIAGNOSES  Final diagnoses:  Nonrheumatic aortic valve stenosis  Generalized weakness     MEDICATIONS GIVEN DURING THIS VISIT:  Medications  isosorbide mononitrate (IMDUR) 24 hr tablet 30 mg (30 mg Oral Given 07/13/20 1907)     ED Discharge Orders         Ordered    isosorbide mononitrate (IMDUR) 30 MG 24 hr tablet  2 times daily        07/13/20 1822           Note:  This document was prepared using Dragon voice recognition software and may include unintentional dictation errors.   Duffy Bruce, MD 07/13/20 [phone]

## 2020-07-14 ENCOUNTER — Encounter: Payer: Self-pay | Admitting: Cardiology

## 2020-07-14 ENCOUNTER — Ambulatory Visit: Payer: PPO | Admitting: Cardiology

## 2020-07-14 VITALS — BP 136/68 | HR 72 | Ht 64.0 in | Wt 161.0 lb

## 2020-07-14 DIAGNOSIS — I350 Nonrheumatic aortic (valve) stenosis: Secondary | ICD-10-CM | POA: Diagnosis not present

## 2020-07-14 DIAGNOSIS — I272 Pulmonary hypertension, unspecified: Secondary | ICD-10-CM | POA: Diagnosis not present

## 2020-07-14 DIAGNOSIS — I10 Essential (primary) hypertension: Secondary | ICD-10-CM | POA: Diagnosis not present

## 2020-07-14 MED ORDER — FUROSEMIDE 20 MG PO TABS
20.0000 mg | ORAL_TABLET | Freq: Every day | ORAL | 6 refills | Status: DC | PRN
Start: 2020-07-14 — End: 2021-05-03

## 2020-07-14 MED ORDER — POTASSIUM CHLORIDE ER 10 MEQ PO TBCR: 10.0000 meq | EXTENDED_RELEASE_TABLET | ORAL | 6 refills | Status: DC

## 2020-07-14 NOTE — Telephone Encounter (Signed)
Patient seen today by DOD Dr. Garen Lah. Closing this encounter.

## 2020-07-14 NOTE — Progress Notes (Signed)
Cardiology Office Note:    Date:  07/14/2020   ID:  Jasmine Webster, DOB 27-Nov-1925, MRN [id number]  PCP:  Jerrol Banana., MD  [facility] Cardiologist:  Ida Rogue, MD  [place] [facility] Electrophysiologist:  None   Referring MD: Jerrol Banana.,*   Chief Complaint  Patient presents with  . OTHER    ED follow up for hypertension. Medications verbally reviewed with patient.     History of Present Illness:    Jasmine Webster is a 86 y.o. female with a hx of diabetes, hypertension, hyperlipidemia, moderate aortic stenosis on echo in 02/2020, moderate pulmonary hypertension who presents after ED follow-up.  Patient seen in the emergency room yesterday due to weakness and elevated blood pressure.  She felt well earlier during the day, but upon standing up to walk to another room, felt sluggish, weak, heavy.  Patient states it felt like "out of body experience".  She took 3 nitroglycerin and she always thinks whenever something happens is due to her heart.  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  Upon standing, her knees buckled/gave out on her.  Her son-in-law, who was working from home caught her and prevented the fall.  She denies chest pain, shortness of breath, palpitations, dizziness, blurry vision, loss of consciousness, lightheadedness.  She was then taken to the emergency room.  Symptoms resolved while in the emergency room.  Blood pressure in the ED was initially was 137/55 but later on increased to 568L systolic and 275T systolic.Marland Kitchen  EKG was nonischemic and troponins were unremarkable.  She was given something for blood pressure in the emergency room and recommended to follow-up with cardiology.  Her blood pressure medication was increased to twice daily.  She has not had any similar symptoms since.  Past Medical History:  Diagnosis Date  . Arthritis   . Cataract    cataract removal bilaterally approx 20 years ago  . Chronic back pain   . Coronary  artery disease   . Diabetes mellitus without complication ([place])   . Heart murmur   . Hyperlipidemia   . Hypertension     Past Surgical History:  Procedure Laterality Date  . ABDOMINAL HYSTERECTOMY    . APPENDECTOMY    . BACK SURGERY     three  . CARDIAC CATHETERIZATION     6+yrs no stents Dr. Verneda Skill  . CATARACT EXTRACTION    . THORACOLUMBAR SYRINGO SHUNT    . TONSILLECTOMY AND ADENOIDECTOMY      Current Medications: Current Meds  Medication Sig  . ALPRAZolam (XANAX) 0.5 MG tablet TAKE 1 TABLET BY MOUTH EVERY DAY AT BEDTIME AS NEEDED  . Artificial Tear Ointment (DRY EYES OP) Apply 1 drop to eye daily.  Marland Kitchen aspirin EC 81 MG tablet Take 81 mg by mouth daily.  Marland Kitchen glucose blood (ONE TOUCH ULTRA TEST) test strip Check sugar twice daily.  Marland Kitchen HYDROcodone-acetaminophen (NORCO/VICODIN) 5-325 MG tablet Take 1 tablet by mouth every 6 (six) hours as needed for moderate pain.  Marland Kitchen insulin glargine (LANTUS SOLOSTAR) 100 UNIT/ML Solostar Pen 90 day supply. (Patient taking differently: Inject 28-30 Units into the skin daily. 90 day supply.)  . Insulin Pen Needle (B-D ULTRAFINE III SHORT PEN) 31G X 8 MM MISC CHECK SUGAR 2 TIMES DAILY, DX E11.9 (NEEDS ULTRA FINE PEN NEEDLES)  . Insulin Pen Needle (FIFTY50 PEN NEEDLES) 31G X 8 MM MISC Check sugar 2 times daily, DX E11.9 (needs ultra fine pen needles)  .  isosorbide mononitrate (IMDUR) 30 MG 24 hr tablet Take 1 tablet (30 mg total) by mouth in the morning and at bedtime.  . Lancets (ONETOUCH ULTRASOFT) lancets Check sugar twice daily DX E11.9  . Multiple Vitamins-Minerals (PRESERVISION AREDS) CAPS Take 1 capsule by mouth 2 (two) times daily.   . nitroGLYCERIN (NITROSTAT) 0.4 MG SL tablet PLACE 1 TABLET (0.4 MG TOTAL) UNDER THE TONGUE EVERY 5 (FIVE) MINUTES AS NEEDED FOR CHEST PAIN.  Marland Kitchen Omega-3 Fatty Acids (FISH OIL PO) Take by mouth daily.  Marland Kitchen omeprazole (PRILOSEC) 20 MG capsule TAKE 1 CAPSULE BY MOUTH EVERY DAY  . ONETOUCH ULTRA test strip CHECK  SUGAR TWICE A DAY  . potassium chloride (KLOR-CON) 10 MEQ tablet Take 1 tablet (10 mEq total) by mouth as directed. Take 1 tablet (10 mEq) 2-3 times a week with furosemide.  . quinapril-hydrochlorothiazide (ACCURETIC) 20-12.5 MG tablet TAKE 1 TABLET BY MOUTH TWICE A DAY (Patient taking differently: Take 1 tablet by mouth daily. )  . rosuvastatin (CRESTOR) 5 MG tablet TAKE 1 TABLET BY MOUTH EVERY DAY  . [DISCONTINUED] furosemide (LASIX) 20 MG tablet Take 1 tablet (20 mg total) by mouth daily as needed. Take 1 tablet (20 mg) 2-3 times a week  . [DISCONTINUED] potassium chloride (KLOR-CON) 10 MEQ tablet Take 1 tablet (10 mEq total) by mouth as directed. Take 1 tablet (10 mEq) 2-3 times a week with furosemide.     Allergies:   Etodolac, Naproxen, Statins, Latex, Terbinafine, and Terbinafine hcl   Social History   Socioeconomic History  . Marital status: Widowed    Spouse name: Not on file  . Number of children: 2  . Years of education: Not on file  . Highest education level: 8th grade  Occupational History  . Occupation: retired  Tobacco Use  . Smoking status: Never Smoker  . Smokeless tobacco: Never Used  Vaping Use  . Vaping Use: Never used  Substance and Sexual Activity  . Alcohol use: No    Alcohol/week: 0.0 standard drinks  . Drug use: No  . Sexual activity: Never  Other Topics Concern  . Not on file  Social History Narrative  . Not on file   Social Determinants of Health   Financial Resource Strain: Low Risk   . Difficulty of Paying Living Expenses: Not hard at all  Food Insecurity: No Food Insecurity  . Worried About Charity fundraiser in the Last Year: Never true  . Ran Out of Food in the Last Year: Never true  Transportation Needs: No Transportation Needs  . Lack of Transportation (Medical): No  . Lack of Transportation (Non-Medical): No  Physical Activity: Inactive  . Days of Exercise per Week: 0 days  . Minutes of Exercise per Session: 0 min  Stress: No Stress  Concern Present  . Feeling of Stress : Not at all  Social Connections: Moderately Isolated  . Frequency of Communication with Friends and Family: More than three times a week  . Frequency of Social Gatherings with Friends and Family: More than three times a week  . Attends Religious Services: More than 4 times per year  . Active Member of Clubs or Organizations: No  . Attends Archivist Meetings: Never  . Marital Status: Widowed     Family History: The patient's family history includes Cancer in her brother and brother; Diabetes in her father; Heart attack in her mother; Heart disease in her mother and sister; Pneumonia in her father; Stroke in her brother, father,  and sister; Sudden death in her brother, sister, and sister.  ROS:   Please see the history of present illness.     All other systems reviewed and are negative.  EKGs/Labs/Other Studies Reviewed:    The following studies were reviewed today:   EKG:  EKG is  ordered today.  The ekg ordered today demonstrates normal sinus rhythm, left bundle branch block.  Recent Labs: 04/10/2020: TSH 2.280 07/13/2020: ALT 18; B Natriuretic Peptide 90.4; BUN 22; Creatinine, Ser 1.17; Hemoglobin 12.6; Platelets 173; Potassium 4.0; Sodium 139  Recent Lipid Panel    Component Value Date/Time   CHOL 171 05/14/2019 0811   TRIG 217 (H) 05/14/2019 0811   HDL 45 05/14/2019 0811   CHOLHDL 3.8 05/14/2019 0811   LDLCALC 83 05/14/2019 0811    Physical Exam:    VS:  BP 136/68 (BP Location: Left Arm, Patient Position: Sitting, Cuff Size: Normal)   Pulse 72   Ht 5\' 4"  (1.626 m)   Wt 161 lb (73 kg)   SpO2 97%   BMI 27.64 kg/m     Wt Readings from Last 3 Encounters:  07/14/20 161 lb (73 kg)  07/13/20 161 lb (73 kg)  05/17/20 162 lb (73.5 kg)     GEN:  Well nourished, well developed in no acute distress HEENT: Normal NECK: No JVD; No carotid bruits LYMPHATICS: No lymphadenopathy CARDIAC: RRR, 3/6 systolic murmur RESPIRATORY:   Clear to auscultation without rales, wheezing or rhonchi  ABDOMEN: Soft, non-tender, non-distended MUSCULOSKELETAL:  No edema; No deformity  SKIN: Warm and dry NEUROLOGIC:  Alert and oriented x 3 PSYCHIATRIC:  Normal affect   ASSESSMENT:    1. Essential hypertension   2. Aortic valve stenosis, etiology of cardiac valve disease unspecified   3. Pulmonary hypertension, unspecified ([place])    PLAN:    In order of problems listed above:  1. Patient with history of hypertension, recent ED visit with elevated blood pressure.  Quinapril and HCTZ increased to twice daily.  Blood pressure today appears reasonable with systolic 388.  Continue as prescribed.  Continue Imdur. 2. History of moderate aortic valve stenosis.  She has no edema or crackles on my exam.  Continue to monitor.  We will plan for serial echocardiogram for valve monitoring. 3. History of moderate pulmonary hypertension on last echocardiogram.  Continue Lasix as needed for edema or shortness of breath.  Patient is currently without edema and asymptomatic.  Keep follow-up appointment with Dr. Rockey Situ and or APP  Total encounter time 40 minutes  Greater than 50% was spent in counseling and coordination of care with the patient    Medication Adjustments/Labs and Tests Ordered: Current medicines are reviewed at length with the patient today.  Concerns regarding medicines are outlined above.  Orders Placed This Encounter  Procedures  . EKG 12-Lead   Meds ordered this encounter  Medications  . furosemide (LASIX) 20 MG tablet    Sig: Take 1 tablet (20 mg total) by mouth daily as needed. Take 1 tablet (20 mg) 2-3 times a week    Dispense:  36 tablet    Refill:  6  . potassium chloride (KLOR-CON) 10 MEQ tablet    Sig: Take 1 tablet (10 mEq total) by mouth as directed. Take 1 tablet (10 mEq) 2-3 times a week with furosemide.    Dispense:  36 tablet    Refill:  6    Patient Instructions  Medication Instructions:   Refilled  your Lasix today.  *If you  need a refill on your cardiac medications before your next appointment, please call your pharmacy*   Lab Work: None Ordered If you have labs (blood work) drawn today and your tests are completely normal, you will receive your results only by: Marland Kitchen MyChart Message (if you have MyChart) OR . A paper copy in the mail If you have any lab test that is abnormal or we need to change your treatment, we will call you to review the results.   Testing/Procedures: None Ordered   Follow-Up: At [facility], you and your health needs are our priority.  As part of our continuing mission to provide you with exceptional heart care, we have created designated Provider Care Teams.  These Care Teams include your primary Cardiologist (physician) and Advanced Practice Providers (APPs -  Physician Assistants and Nurse Practitioners) who all work together to provide you with the care you need, when you need it.  We recommend signing up for the patient portal called "MyChart".  Sign up information is provided on this After Visit Summary.  MyChart is used to connect with patients for Virtual Visits (Telemedicine).  Patients are able to view lab/test results, encounter notes, upcoming appointments, etc.  Non-urgent messages can be sent to your provider as well.   To learn more about what you can do with MyChart, go to [email or web address].    Your next appointment:   2 month(s)  The format for your next appointment:   In Person  Provider:   Ida Rogue, MD   Other Instructions      Signed, Kate Sable, MD  07/14/2020 5:26 PM    [place]

## 2020-07-14 NOTE — Patient Instructions (Signed)
Medication Instructions:   Refilled your Lasix today.  *If you need a refill on your cardiac medications before your next appointment, please call your pharmacy*   Lab Work: None Ordered If you have labs (blood work) drawn today and your tests are completely normal, you will receive your results only by: Marland Kitchen MyChart Message (if you have MyChart) OR . A paper copy in the mail If you have any lab test that is abnormal or we need to change your treatment, we will call you to review the results.   Testing/Procedures: None Ordered   Follow-Up: At [facility], you and your health needs are our priority.  As part of our continuing mission to provide you with exceptional heart care, we have created designated Provider Care Teams.  These Care Teams include your primary Cardiologist (physician) and Advanced Practice Providers (APPs -  Physician Assistants and Nurse Practitioners) who all work together to provide you with the care you need, when you need it.  We recommend signing up for the patient portal called "MyChart".  Sign up information is provided on this After Visit Summary.  MyChart is used to connect with patients for Virtual Visits (Telemedicine).  Patients are able to view lab/test results, encounter notes, upcoming appointments, etc.  Non-urgent messages can be sent to your provider as well.   To learn more about what you can do with MyChart, go to [email or web address].    Your next appointment:   2 month(s)  The format for your next appointment:   In Person  Provider:   Ida Rogue, MD   Other Instructions

## 2020-07-26 NOTE — Progress Notes (Signed)
I,[name],acting as a scribe for Jasmine Durie, MD.,have documented all relevant documentation on the behalf of Jasmine Durie, MD,as directed by  Jasmine Durie, MD while in the presence of Jasmine Durie, MD.   Established patient visit   Patient: Jasmine Webster   DOB: 07-25-1926   86 y.o. Female  MRN: [id number] Visit Date: 07/27/2020  Today's healthcare provider: [name], MD   Chief Complaint  Patient presents with  . Diabetes  . Follow-up  . Hypertension   Subjective    HPI  Patient had another near syncopal episode that was evaluated in the ED.  She was told to increase fluids.  They thought aortic stenosis might be contributory.  When this happened at home the patient nor her family ever checked her sugar. She has felt well since then and had no problems. Diabetes Mellitus Type II, follow-up  Lab Results  Component Value Date   HGBA1C 6.7 (A) 07/27/2020   HGBA1C 6.9 (H) 04/10/2020   HGBA1C 7.0 (A) 01/10/2020   Last seen for diabetes 3 months ago.  Management since then includes continuing the same treatment. She reports good compliance with treatment. She is not having side effects. none  Home blood sugar records: fasting range: 108-150  Episodes of hypoglycemia? No none   Current insulin regiment: n/a Most Recent Eye Exam: due  --------------------------------------------------------------------  Hypertension, follow-up  BP Readings from Last 3 Encounters:  07/27/20 115/68  07/14/20 136/68  07/13/20 (!) 168/58   Wt Readings from Last 3 Encounters:  07/27/20 163 lb (73.9 kg)  07/14/20 161 lb (73 kg)  07/13/20 161 lb (73 kg)     She was last seen for hypertension 3 months ago.  BP at that visit was 109/59. Management since that visit includes; Try to go to a full Lasix Monday Wednesday Friday along with the potassium.  She sees Dr. Rockey Situ in 2 weeks and I will see her in 3 weeks. She reports good compliance with  treatment. She is not having side effects. none She is not exercising. She is adherent to low salt diet.   Outside blood pressures are 136/70.  She does not smoke.  Use of agents associated with hypertension: none.   --------------------------------------------------------------------  L-S radiculopathy From 04/26/2020-Patient walks with a cane and has had no falls recently.       Medications: Outpatient Medications Prior to Visit  Medication Sig  . ALPRAZolam (XANAX) 0.5 MG tablet TAKE 1 TABLET BY MOUTH EVERY DAY AT BEDTIME AS NEEDED  . Artificial Tear Ointment (DRY EYES OP) Apply 1 drop to eye daily.  Marland Kitchen aspirin EC 81 MG tablet Take 81 mg by mouth daily.  . furosemide (LASIX) 20 MG tablet Take 1 tablet (20 mg total) by mouth daily as needed. Take 1 tablet (20 mg) 2-3 times a week  . glucose blood (ONE TOUCH ULTRA TEST) test strip Check sugar twice daily.  Marland Kitchen HYDROcodone-acetaminophen (NORCO/VICODIN) 5-325 MG tablet Take 1 tablet by mouth every 6 (six) hours as needed for moderate pain.  Marland Kitchen insulin glargine (LANTUS SOLOSTAR) 100 UNIT/ML Solostar Pen 90 day supply. (Patient taking differently: Inject 28-30 Units into the skin daily. 90 day supply.)  . Insulin Pen Needle (B-D ULTRAFINE III SHORT PEN) 31G X 8 MM MISC CHECK SUGAR 2 TIMES DAILY, DX E11.9 (NEEDS ULTRA FINE PEN NEEDLES)  . Insulin Pen Needle (FIFTY50 PEN NEEDLES) 31G X 8 MM MISC Check sugar 2 times daily, DX E11.9 (needs ultra  fine pen needles)  . isosorbide mononitrate (IMDUR) 30 MG 24 hr tablet Take 1 tablet (30 mg total) by mouth in the morning and at bedtime.  . Lancets (ONETOUCH ULTRASOFT) lancets Check sugar twice daily DX E11.9  . Multiple Vitamins-Minerals (PRESERVISION AREDS) CAPS Take 1 capsule by mouth 2 (two) times daily.   . nitroGLYCERIN (NITROSTAT) 0.4 MG SL tablet PLACE 1 TABLET (0.4 MG TOTAL) UNDER THE TONGUE EVERY 5 (FIVE) MINUTES AS NEEDED FOR CHEST PAIN.  Marland Kitchen Omega-3 Fatty Acids (FISH OIL PO) Take by mouth  daily.  Marland Kitchen omeprazole (PRILOSEC) 20 MG capsule TAKE 1 CAPSULE BY MOUTH EVERY DAY  . ONETOUCH ULTRA test strip CHECK SUGAR TWICE A DAY  . potassium chloride (KLOR-CON) 10 MEQ tablet Take 1 tablet (10 mEq total) by mouth as directed. Take 1 tablet (10 mEq) 2-3 times a week with furosemide.  . quinapril-hydrochlorothiazide (ACCURETIC) 20-12.5 MG tablet TAKE 1 TABLET BY MOUTH TWICE A DAY (Patient taking differently: Take 1 tablet by mouth daily. )  . rosuvastatin (CRESTOR) 5 MG tablet TAKE 1 TABLET BY MOUTH EVERY DAY   No facility-administered medications prior to visit.    Review of Systems  Constitutional: Negative for appetite change, chills, fatigue and fever.  Respiratory: Negative for chest tightness and shortness of breath.   Cardiovascular: Negative for chest pain and palpitations.  Gastrointestinal: Negative for abdominal pain, nausea and vomiting.  Neurological: Negative for dizziness and weakness.    Last hemoglobin A1c Lab Results  Component Value Date   HGBA1C 6.7 (A) 07/27/2020      Objective    BP 115/68 (BP Location: Left Arm, Patient Position: Sitting, Cuff Size: Large)   Pulse 70   Temp 98.3 F (36.8 C) (Oral)   Resp 18   Ht 5\' 4"  (1.626 m)   Wt 163 lb (73.9 kg)   SpO2 97%   BMI 27.98 kg/m  BP Readings from Last 3 Encounters:  07/27/20 115/68  07/14/20 136/68  07/13/20 (!) 168/58   Wt Readings from Last 3 Encounters:  07/27/20 163 lb (73.9 kg)  07/14/20 161 lb (73 kg)  07/13/20 161 lb (73 kg)      Physical Exam Vitals reviewed.  Constitutional:      Appearance: She is well-developed.  HENT:     Head: Normocephalic and atraumatic.     Right Ear: External ear normal.     Left Ear: External ear normal.     Nose: Nose normal.  Eyes:     Conjunctiva/sclera: Conjunctivae normal.     Pupils: Pupils are equal, round, and reactive to light.  Neck:     Thyroid: No thyromegaly.  Cardiovascular:     Rate and Rhythm: Normal rate and regular rhythm.      Heart sounds: Murmur heard.      Comments: 3/6 Murmur at RUSB. Pulmonary:     Effort: Pulmonary effort is normal.     Breath sounds: Normal breath sounds.  Abdominal:     Palpations: Abdomen is soft.  Musculoskeletal:     Cervical back: Normal range of motion and neck supple.  Skin:    General: Skin is warm and dry.  Neurological:     General: No focal deficit present.     Mental Status: She is alert and oriented to person, place, and time.     Deep Tendon Reflexes: Reflexes are normal and symmetric.  Psychiatric:        Mood and Affect: Mood normal.  Behavior: Behavior normal.        Thought Content: Thought content normal.        Judgment: Judgment normal.     BP 115/68 (BP Location: Left Arm, Patient Position: Sitting, Cuff Size: Large)   Pulse 70   Temp 98.3 F (36.8 C) (Oral)   Resp 18   Ht 5\' 4"  (1.626 m)   Wt 163 lb (73.9 kg)   SpO2 97%   BMI 27.98 kg/m                                                                  Results for orders placed or performed in visit on 07/27/20  POCT glycosylated hemoglobin (Hb A1C)  Result Value Ref Range   Hemoglobin A1C 6.7 (A) 4.0 - 5.6 %   Est. average glucose Bld gHb Est-mCnc 146     Assessment & Plan     1. Controlled type 2 diabetes mellitus with other circulatory complication, unspecified whether long term insulin use (HCC) Cut back on Insulin from 30 to 25 units to avoid hypoglycemia. - POCT glycosylated hemoglobin (Hb A1C)--6.7  2. Benign essential HTN   3. Angina pectoris (HCC) Stable  4. Nonrheumatic aortic valve stenosis Per cardiology.   Return in about 4 months (around 11/26/2020).      I, Jasmine Durie, MD, have reviewed all documentation for this visit. The documentation on 08/05/20 for the exam, diagnosis, procedures, and orders are all accurate and complete.    Jaylean Buenaventura Cranford Mon, MD  [facility] [phone] (phone) [phone] (fax)  [place]

## 2020-07-27 ENCOUNTER — Encounter: Payer: Self-pay | Admitting: Family Medicine

## 2020-07-27 ENCOUNTER — Other Ambulatory Visit: Payer: Self-pay

## 2020-07-27 ENCOUNTER — Ambulatory Visit (INDEPENDENT_AMBULATORY_CARE_PROVIDER_SITE_OTHER): Payer: PPO | Admitting: Family Medicine

## 2020-07-27 VITALS — BP 115/68 | HR 70 | Temp 98.3°F | Resp 18 | Ht 64.0 in | Wt 163.0 lb

## 2020-07-27 DIAGNOSIS — I350 Nonrheumatic aortic (valve) stenosis: Secondary | ICD-10-CM | POA: Diagnosis not present

## 2020-07-27 DIAGNOSIS — I10 Essential (primary) hypertension: Secondary | ICD-10-CM | POA: Diagnosis not present

## 2020-07-27 DIAGNOSIS — I209 Angina pectoris, unspecified: Secondary | ICD-10-CM | POA: Diagnosis not present

## 2020-07-27 DIAGNOSIS — E1159 Type 2 diabetes mellitus with other circulatory complications: Secondary | ICD-10-CM | POA: Diagnosis not present

## 2020-07-27 LAB — POCT GLYCOSYLATED HEMOGLOBIN (HGB A1C)
Est. average glucose Bld gHb Est-mCnc: 146
Hemoglobin A1C: 6.7 % — AB (ref 4.0–5.6)

## 2020-07-27 NOTE — Patient Instructions (Addendum)
Decrease Insulin from 30 to 25 units daily.

## 2020-08-03 ENCOUNTER — Other Ambulatory Visit: Payer: Self-pay | Admitting: Family Medicine

## 2020-08-03 DIAGNOSIS — I209 Angina pectoris, unspecified: Secondary | ICD-10-CM

## 2020-08-03 NOTE — Telephone Encounter (Signed)
Requested medication (s) are due for refill today - yes  Requested medication (s) are on the active medication list -no  Future visit scheduled -yes  Last refill: 3 weeks  Notes to clinic: Patient is requesting RF of medication prescribed by ED provider- sent to PCP for review   Requested Prescriptions  Pending Prescriptions Disp Refills   isosorbide mononitrate (IMDUR) 30 MG 24 hr tablet [Pharmacy Med Name: ISOSORBIDE MONONIT ER 30 MG TB] 90 tablet 1    Sig: TAKE 1 TABLET BY MOUTH EVERY DAY      Cardiovascular:  Nitrates Passed - 08/03/2020  1:34 PM      Passed - Last BP in normal range    BP Readings from Last 1 Encounters:  07/27/20 115/68          Passed - Last Heart Rate in normal range    Pulse Readings from Last 1 Encounters:  07/27/20 70          Passed - Valid encounter within last 12 months    Recent Outpatient Visits           1 week ago Controlled type 2 diabetes mellitus with other circulatory complication, unspecified whether long term insulin use ([place])   [facility] Jerrol Banana., MD   3 months ago Essential (primary) hypertension   [facility] Jerrol Banana., MD   3 months ago Controlled type 2 diabetes mellitus with other circulatory complication, unspecified whether long term insulin use [facility])   [facility] Jerrol Ban[facility]., MD   5 months ago Cardiovascular disease   [facility] Jerrol Banana., MD   6 months ago Controlled type 2 diabetes mellitus with other circulatory complication, unspecified whether long term insulin use [facility])   [facility] Jerrol Banana., MD       Future Appointments             In 3 months Jerrol Banana., MD [facility]                Requested Prescriptions  Pending Prescriptions Disp Refills   isosorbide mononitrate (IMDUR) 30 MG 24 hr tablet [Pharmacy Med Name: ISOSORBIDE  MONONIT ER 30 MG TB] 90 tablet 1    Sig: TAKE 1 TABLET BY MOUTH EVERY DAY      Cardiovascular:  Nitrates Passed - 08/03/2020  1:34 PM      Passed - Last BP in normal range    BP Readings from Last 1 Encounters:  07/27/20 115/68          Passed - Last Heart Rate in normal range    Pulse Readings from Last 1 Encounters:  07/27/20 70          Passed - Valid encounter within last 12 months    Recent Outpatient Visits           1 week ago Controlled type 2 diabetes mellitus with other circulatory complication, unspecified whether long term insulin use [facility])   [facility] Jerrol Banana., MD   3 months ago Essential (primary) hypertension   [facility] Jerrol Banana., MD   3 months ago Controlled type 2 diabetes mellitus with other circulatory complication, unspecified whether long term insulin use [facility])   [facility] Jerrol Banana., MD   5 months ago Cardiovascular disease   [place], Retia Passe.,  MD   6 months ago Controlled type 2 diabetes mellitus with other circulatory complication, unspecified whether long term insulin use [facility])   [facility] Jerrol Banana., MD       Future Appointments             In 3 months Jerrol Banana., MD [facility] [place]

## 2020-08-10 ENCOUNTER — Other Ambulatory Visit: Payer: Self-pay | Admitting: Family Medicine

## 2020-08-10 NOTE — Telephone Encounter (Signed)
Requested  medications are  due for refill today yes  Requested medications are on the active medication list yes  Last refill 7/2  Last visit Sept 2021  Future visit scheduled Jan 2022  Notes to clinic Failed protocol of labs within 360 days.

## 2020-09-15 ENCOUNTER — Ambulatory Visit: Payer: PPO | Admitting: Cardiovascular Disease

## 2020-09-16 ENCOUNTER — Other Ambulatory Visit: Payer: Self-pay | Admitting: Family Medicine

## 2020-09-16 DIAGNOSIS — F419 Anxiety disorder, unspecified: Secondary | ICD-10-CM

## 2020-09-16 NOTE — Telephone Encounter (Signed)
Requested medication (s) are due for refill today: yes  Requested medication (s) are on the active medication list: yes  Last refill:  03/06/20  Future visit scheduled: yes  Notes to clinic:  med not delegated to NT to RF    Requested Prescriptions  Pending Prescriptions Disp Refills   ALPRAZolam (XANAX) 0.5 MG tablet [Pharmacy Med Name: ALPRAZOLAM 0.5 MG TABLET] 30 tablet     Sig: TAKE 1 TABLET BY MOUTH EVERY DAY AT BEDTIME AS NEEDED      Not Delegated - Psychiatry:  Anxiolytics/Hypnotics Failed - 09/16/2020 10:12 AM      Failed - This refill cannot be delegated      Failed - Urine Drug Screen completed in last 360 days      Passed - Valid encounter within last 6 months    Recent Outpatient Visits           1 month ago Controlled type 2 diabetes mellitus with other circulatory complication, unspecified whether long term insulin use ([place])   [facility] Jerrol Ban[facility] MD   4 months ago Essential (primary) hypertension   [facility] Jerrol Ban[facility] MD   5 months ago Controlled type 2 diabetes mellitus with other circulatory complication, unspecified whether long term insulin use [facility])   [facility] Jerrol Ban[facility] MD   7 months ago Cardiovascular disease   [facility] Jer[facility] Ban[facility] MD   8 months ago Controlled type 2 diabetes mellitus with other circulatory complication, unspecified whether long term insulin use [facility])   [facility] Jerrol Banana., MD       Future Appointments             In 2 months Jerrol Banana., MD [facility]

## 2020-09-21 ENCOUNTER — Ambulatory Visit: Payer: PPO | Admitting: Podiatry

## 2020-09-21 ENCOUNTER — Other Ambulatory Visit: Payer: Self-pay

## 2020-09-21 ENCOUNTER — Encounter: Payer: Self-pay | Admitting: Podiatry

## 2020-09-21 DIAGNOSIS — B351 Tinea unguium: Secondary | ICD-10-CM

## 2020-09-21 DIAGNOSIS — M79675 Pain in left toe(s): Secondary | ICD-10-CM

## 2020-09-21 DIAGNOSIS — M79674 Pain in right toe(s): Secondary | ICD-10-CM

## 2020-09-21 DIAGNOSIS — E119 Type 2 diabetes mellitus without complications: Secondary | ICD-10-CM | POA: Diagnosis not present

## 2020-09-21 NOTE — Progress Notes (Signed)
This patient returns to my office for at risk foot care.  This patient requires this care by a professional since this patient will be at risk due to having diabetes  This patient is unable to cut nails herself since the patient cannot reach her nails.These nails are painful walking and wearing shoes.  This patient presents for at risk foot care today.  General Appearance  Alert, conversant and in no acute stress.  Vascular  Dorsalis pedis and posterior tibial  pulses are palpable  bilaterally.  Capillary return is within normal limits  bilaterally. Temperature is within normal limits  bilaterally.  Neurologic  Senn-Weinstein monofilament wire test within normal limits  bilaterally. Muscle power within normal limits bilaterally.  Nails Thick disfigured discolored nails with subungual debris  from hallux to fifth toes bilaterally except nail plate left hallux.. No evidence of bacterial infection or drainage bilaterally.  Orthopedic  No limitations of motion  feet .  No crepitus or effusions noted.  HAV  B/L.  Hammer toes second  B/L.  Skin  normotropic skin with no porokeratosis noted bilaterally.  No signs of infections or ulcers noted.     Onychomycosis  Pain in right toes  Pain in left toes  Consent was obtained for treatment procedures.   Mechanical debridement of nails 1-5  bilaterally performed with a nail nipper.  Filed with dremel without incident.    Return office visit   3 months                   Told patient to return for periodic foot care and evaluation due to potential at risk complications.   Gardiner Barefoot DPM

## 2020-10-09 ENCOUNTER — Other Ambulatory Visit: Payer: Self-pay | Admitting: Family Medicine

## 2020-10-09 DIAGNOSIS — I209 Angina pectoris, unspecified: Secondary | ICD-10-CM

## 2020-10-09 DIAGNOSIS — R079 Chest pain, unspecified: Secondary | ICD-10-CM

## 2020-10-20 ENCOUNTER — Other Ambulatory Visit: Payer: Self-pay | Admitting: Family Medicine

## 2020-11-28 ENCOUNTER — Ambulatory Visit: Payer: Self-pay | Admitting: Family Medicine

## 2020-12-18 ENCOUNTER — Other Ambulatory Visit: Payer: Self-pay | Admitting: Family Medicine

## 2020-12-18 DIAGNOSIS — F419 Anxiety disorder, unspecified: Secondary | ICD-10-CM

## 2020-12-18 NOTE — Telephone Encounter (Signed)
Non delegated refill for Xanax

## 2020-12-19 DIAGNOSIS — U071 COVID-19: Secondary | ICD-10-CM

## 2020-12-19 HISTORY — DX: COVID-19: U07.1

## 2020-12-27 ENCOUNTER — Other Ambulatory Visit: Payer: Self-pay | Admitting: Family Medicine

## 2020-12-27 DIAGNOSIS — E1159 Type 2 diabetes mellitus with other circulatory complications: Secondary | ICD-10-CM

## 2020-12-28 ENCOUNTER — Ambulatory Visit: Payer: PPO | Admitting: Podiatry

## 2020-12-28 ENCOUNTER — Telehealth: Payer: Self-pay

## 2020-12-28 ENCOUNTER — Other Ambulatory Visit (INDEPENDENT_AMBULATORY_CARE_PROVIDER_SITE_OTHER): Payer: PPO | Admitting: *Deleted

## 2020-12-28 DIAGNOSIS — Z1152 Encounter for screening for COVID-19: Secondary | ICD-10-CM

## 2020-12-28 DIAGNOSIS — R110 Nausea: Secondary | ICD-10-CM

## 2020-12-28 DIAGNOSIS — U071 COVID-19: Secondary | ICD-10-CM

## 2020-12-28 NOTE — Telephone Encounter (Signed)
Patient's daughter [name] was advised to bring patient to office for testing.

## 2020-12-28 NOTE — Telephone Encounter (Signed)
Covid test ordered. Tried calling pt's daughter, no answer and no vm. Will try again later.

## 2020-12-28 NOTE — Telephone Encounter (Signed)
Please advise 

## 2020-12-28 NOTE — Telephone Encounter (Signed)
Copied from [place] [phone]. Topic: General - Inquiry >> Dec 28, 2020 11:21 AM Pawlus, Brayton Layman A wrote: Reason for CRM: Pts daughter called in concerned because her mother is coughing / sneezing. Caller is worried she may have COVID. Pts daughter really wanted to speak with Dr Rosanna Randy and see if he could advise her of how to help her mother.

## 2020-12-29 NOTE — Telephone Encounter (Signed)
Jasmine Webster was advised. Jasmine Webster wanted to know what OTC medication was safe for pt to take for cough. Per [employer]. Coricidin HBP is ok per package directions is okay and she can take plain ( guaifenesin / mucinex )per package to help with mucous.

## 2020-12-29 NOTE — Telephone Encounter (Signed)
Pt is feeling worse and she would like to get some medication called in today if she could / please advise and call pts daughter

## 2020-12-29 NOTE — Telephone Encounter (Signed)
Noted  

## 2020-12-29 NOTE — Telephone Encounter (Signed)
She would need an evaluation virtual and since it is Friday afternoon at 4pm there are likely are no more appointments available.  If she is worse over the weekend, she should be seen in the emergency room or urgent care immediately.

## 2020-12-29 NOTE — Telephone Encounter (Signed)
Patient was tested here for covid yesterday. Please advise patient's daughter [name]?

## 2020-12-30 LAB — NOVEL CORONAVIRUS, NAA: SARS-CoV-2, NAA: DETECTED — AB

## 2020-12-30 LAB — SARS-COV-2, NAA 2 DAY TAT

## 2020-12-30 MED ORDER — ONDANSETRON HCL 4 MG PO TABS: 4.0000 mg | ORAL_TABLET | Freq: Three times a day (TID) | ORAL | 0 refills | Status: DC | PRN

## 2020-12-30 NOTE — Addendum Note (Signed)
Addended by: Eulas Post on: 12/30/2020 09:27 AM   Modules accepted: Orders, Level of Service

## 2020-12-30 NOTE — Progress Notes (Signed)
Patient with some nausea and fatigue and feeling poorly but no breathing Difficulties. Ondansetron sent in.  Refer to potential Covid therapy although she has had 3 vaccines.

## 2020-12-30 NOTE — Addendum Note (Signed)
Addended by: Eulas Post on: 12/30/2020 09:30 AM   Modules accepted: Orders

## 2020-12-31 ENCOUNTER — Other Ambulatory Visit: Payer: Self-pay | Admitting: Nurse Practitioner

## 2020-12-31 ENCOUNTER — Encounter: Payer: Self-pay | Admitting: Nurse Practitioner

## 2020-12-31 DIAGNOSIS — Z794 Long term (current) use of insulin: Secondary | ICD-10-CM

## 2020-12-31 DIAGNOSIS — U071 COVID-19: Secondary | ICD-10-CM

## 2020-12-31 DIAGNOSIS — I10 Essential (primary) hypertension: Secondary | ICD-10-CM | POA: Insufficient documentation

## 2020-12-31 DIAGNOSIS — I2510 Atherosclerotic heart disease of native coronary artery without angina pectoris: Secondary | ICD-10-CM | POA: Insufficient documentation

## 2020-12-31 DIAGNOSIS — E118 Type 2 diabetes mellitus with unspecified complications: Secondary | ICD-10-CM

## 2020-12-31 NOTE — Progress Notes (Signed)
I connected by phone with [name] on 12/31/2020 at 9:48 AM to discuss the potential use of a new treatment for mild to moderate COVID-19 viral infection in non-hospitalized patients.  This patient is a 87 y.o. female that meets the FDA criteria for Emergency Use Authorization of COVID monoclonal antibody sotrovimab.  Has a (+) direct SARS-CoV-2 viral test result  Has mild or moderate COVID-19   Is NOT hospitalized due to COVID-19  Is within 10 days of symptom onset  Has at least one of the high risk factor(s) for progression to severe COVID-19 and/or hospitalization as defined in EUA.  Specific high risk criteria : Older age (>/= 85 yo), Diabetes and Cardiovascular disease or hypertension   I have spoken and communicated the following to the patient or parent/caregiver regarding COVID monoclonal antibody treatment:  1. FDA has authorized the emergency use for the treatment of mild to moderate COVID-19 in adults and pediatric patients with positive results of direct SARS-CoV-2 viral testing who are 41 years of age and older weighing at least 40 kg, and who are at high risk for progressing to severe COVID-19 and/or hospitalization.  2. The significant known and potential risks and benefits of COVID monoclonal antibody, and the extent to which such potential risks and benefits are unknown.  3. Information on available alternative treatments and the risks and benefits of those alternatives, including clinical trials.  4. Patients treated with COVID monoclonal antibody should continue to self-isolate and use infection control measures (e.g., wear mask, isolate, social distance, avoid sharing personal items, clean and disinfect "high touch" surfaces, and frequent handwashing) according to CDC guidelines.   5. The patient or parent/caregiver has the option to accept or refuse COVID monoclonal antibody treatment.  After reviewing this information with the patient, the patient has agreed  to receive one of the available covid 19 monoclonal antibodies and will be provided an appropriate fact sheet prior to infusion. Murray Hodgkins, NP 12/31/2020 9:48 AM

## 2021-01-01 ENCOUNTER — Ambulatory Visit (HOSPITAL_COMMUNITY)
Admission: RE | Admit: 2021-01-01 | Discharge: 2021-01-01 | Disposition: A | Discharge status: Home / Self Care | Payer: PPO | Source: Ambulatory Visit | Attending: Pulmonary Disease | Admitting: Pulmonary Disease

## 2021-01-01 ENCOUNTER — Other Ambulatory Visit: Payer: Self-pay

## 2021-01-01 DIAGNOSIS — E118 Type 2 diabetes mellitus with unspecified complications: Secondary | ICD-10-CM | POA: Insufficient documentation

## 2021-01-01 DIAGNOSIS — U071 COVID-19: Secondary | ICD-10-CM | POA: Insufficient documentation

## 2021-01-01 DIAGNOSIS — E1159 Type 2 diabetes mellitus with other circulatory complications: Secondary | ICD-10-CM

## 2021-01-01 DIAGNOSIS — Z794 Long term (current) use of insulin: Secondary | ICD-10-CM | POA: Diagnosis not present

## 2021-01-01 DIAGNOSIS — I10 Essential (primary) hypertension: Secondary | ICD-10-CM

## 2021-01-01 MED ORDER — DIPHENHYDRAMINE HCL 50 MG/ML IJ SOLN: 50.0000 mg | Freq: Once | INTRAMUSCULAR | Status: DC | PRN

## 2021-01-01 MED ORDER — ALBUTEROL SULFATE HFA 108 (90 BASE) MCG/ACT IN AERS: 2.0000 | INHALATION_SPRAY | Freq: Once | RESPIRATORY_TRACT | Status: DC | PRN

## 2021-01-01 MED ORDER — SODIUM CHLORIDE 0.9 % IV SOLN: INTRAVENOUS | Status: DC | PRN

## 2021-01-01 MED ORDER — METHYLPREDNISOLONE SODIUM SUCC 125 MG IJ SOLR: 125.0000 mg | Freq: Once | INTRAMUSCULAR | Status: DC | PRN

## 2021-01-01 MED ORDER — FAMOTIDINE IN NACL 20-0.9 MG/50ML-% IV SOLN: 20.0000 mg | Freq: Once | INTRAVENOUS | Status: DC | PRN

## 2021-01-01 MED ORDER — EPINEPHRINE 0.3 MG/0.3ML IJ SOAJ: 0.3000 mg | Freq: Once | INTRAMUSCULAR | Status: DC | PRN

## 2021-01-01 MED ORDER — SOTROVIMAB 500 MG/8ML IV SOLN
500.0000 mg | Freq: Once | INTRAVENOUS | Status: AC
  Administered 2021-01-01: 500 mg via INTRAVENOUS

## 2021-01-01 NOTE — Telephone Encounter (Signed)
Copied from [place] [phone]. Topic: General - Inquiry >> Jan 01, 2021  9:05 AM Greggory Keen D wrote: Reason for CRM: Pt called saying her pharmacy [facility] [address] told her that Dr. Rosanna Randy would have to order 2 boxes of her insulin in order to get 90 days at a [facility].    She also wanted to let Dr. Rosanna Randy know she is going to [place] to get an infusion for Covid this morning.  CB#  [phone]

## 2021-01-01 NOTE — Progress Notes (Signed)
Patient reviewed Fact Sheet for Patients, Parents, and Caregivers for Emergency Use Authorization (EUA) of sotrovimab for the Treatment of Coronavirus. Patient also reviewed and is agreeable to the estimated cost of treatment. Patient is agreeable to proceed.   

## 2021-01-01 NOTE — Telephone Encounter (Signed)
Please advise 

## 2021-01-01 NOTE — Discharge Instructions (Signed)

## 2021-01-01 NOTE — Progress Notes (Signed)
Diagnosis: COVID-19  Physician: Dr. [facility]  Procedure: Covid Infusion Clinic Med: Sotrovimab infusion - Provided patient with sotrovimab fact sheet for patients, parents, and caregivers prior to infusion.   Complications: No immediate complications noted  Discharge: Discharged home    

## 2021-01-04 ENCOUNTER — Other Ambulatory Visit: Payer: Self-pay | Admitting: Family Medicine

## 2021-01-04 DIAGNOSIS — I209 Angina pectoris, unspecified: Secondary | ICD-10-CM

## 2021-01-04 MED ORDER — LANTUS SOLOSTAR 100 UNIT/ML ~~LOC~~ SOPN: PEN_INJECTOR | SUBCUTANEOUS | 5 refills | Status: DC

## 2021-01-04 NOTE — Telephone Encounter (Signed)
Requested medications are due for refill today yes  Requested medications are on the active medication list yes  Last refill 11/8  Last visit 07/27/20  Future visit scheduled 06/2021  Notes to clinic This does pass protocol, however, the OV note states to come  back in 4 months which would have been January, her appt is in August 2022.

## 2021-01-06 ENCOUNTER — Other Ambulatory Visit: Payer: Self-pay | Admitting: Family Medicine

## 2021-01-06 NOTE — Telephone Encounter (Signed)
Requested medication (s) are due for refill today: Yes  Requested medication (s) are on the active medication list: Yes  Last refill:  12/17/19  Future visit scheduled: No  Notes to clinic:  Unable to refill per protocol, Rx expired.      Requested Prescriptions  Pending Prescriptions Disp Refills   B-D ULTRAFINE III SHORT PEN 31G X 8 MM MISC [Pharmacy Med Name: BD UF SHORT PEN NEEDLE 8MMX31G] 100 each 12    Sig: CHECK SUGAR 2 TIMES DAILY, DX E11.9 (NEEDS ULTRA FINE PEN NEEDLES)      Endocrinology: Diabetes - Testing Supplies Passed - 01/06/2021  1:14 PM      Passed - Valid encounter within last 12 months    Recent Outpatient Visits           5 months ago Controlled type 2 diabetes mellitus with other circulatory complication, unspecified whether long term insulin use [facility])   [facility] Jerrol Banana., MD   8 months ago Essential (primary) hypertension   [facility] Jerrol Ban[facility]., MD   9 months ago Controlled type 2 diabetes mellitus with other circulatory complication, unspecified whether long term insulin use [facility])   [facility] Jerrol Banana., MD   11 months ago Cardiovascular disease   [facility] Jerrol Banana., MD   12 months ago Controlled type 2 diabetes mellitus with other circulatory complication, unspecified whether long term insulin use [facility])   [facility] Jerrol Banana., MD

## 2021-01-17 ENCOUNTER — Other Ambulatory Visit: Payer: Self-pay | Admitting: Family Medicine

## 2021-01-17 NOTE — Telephone Encounter (Signed)
Requested medications are due for refill today yes  Requested medications are on the active medication list yes  Last refill 12/3  Last visit 07/2020  Future visit scheduled Has appt today  Notes to clinic Labs are greater than 360 days, therefore failed protocol.

## 2021-01-18 ENCOUNTER — Ambulatory Visit (INDEPENDENT_AMBULATORY_CARE_PROVIDER_SITE_OTHER): Payer: PPO | Admitting: Family Medicine

## 2021-01-18 ENCOUNTER — Encounter: Payer: Self-pay | Admitting: Family Medicine

## 2021-01-18 ENCOUNTER — Other Ambulatory Visit: Payer: Self-pay

## 2021-01-18 VITALS — BP 135/45 | HR 72 | Temp 98.0°F | Resp 16 | Ht 64.0 in | Wt 162.0 lb

## 2021-01-18 DIAGNOSIS — E1159 Type 2 diabetes mellitus with other circulatory complications: Secondary | ICD-10-CM

## 2021-01-18 DIAGNOSIS — I10 Essential (primary) hypertension: Secondary | ICD-10-CM

## 2021-01-18 DIAGNOSIS — E039 Hypothyroidism, unspecified: Secondary | ICD-10-CM

## 2021-01-18 DIAGNOSIS — E7800 Pure hypercholesterolemia, unspecified: Secondary | ICD-10-CM | POA: Diagnosis not present

## 2021-01-18 LAB — POCT GLYCOSYLATED HEMOGLOBIN (HGB A1C): Hemoglobin A1C: 6.9 % — AB (ref 4.0–5.6)

## 2021-01-18 NOTE — Progress Notes (Unsigned)
Established patient visit   Patient: Jasmine Webster   DOB: [birth year]   87 y.o. Female  MRN: [id number] Visit Date: 01/18/2021  Today's healthcare provider: [name], MD   Chief Complaint  Patient presents with  . Diabetes  . Hypertension   Subjective    HPI  She comes in today for follow-up.  Overall she is doing well.  No falls. Diabetes Mellitus Type II, Follow-up  Lab Results  Component Value Date   HGBA1C 6.9 (A) 01/18/2021   HGBA1C 6.7 (A) 07/27/2020   HGBA1C 6.9 (H) 04/10/2020   Wt Readings from Last 3 Encounters:  01/18/21 162 lb (73.5 kg)  07/27/20 163 lb (73.9 kg)  07/14/20 161 lb (73 kg)   Last seen for diabetes 6 months ago.  Management since then includes decreasing insulin from 30 to 25 units. She reports good compliance with treatment. She is not having side effects.  Symptoms: No fatigue No foot ulcerations  No appetite changes No nausea  No paresthesia of the feet  No polydipsia  No polyuria No visual disturbances   No vomiting     Home blood sugar records: fasting range: 130s  Episodes of hypoglycemia? No    Current insulin regiment: 25 units Most Recent Eye Exam: due Current exercise: no regular exercise Current diet habits: well balanced  Pertinent Labs: Lab Results  Component Value Date   CHOL 171 05/14/2019   HDL 45 05/14/2019   LDLCALC 83 05/14/2019   TRIG 217 (H) 05/14/2019   CHOLHDL 3.8 05/14/2019   Lab Results  Component Value Date   NA 139 07/13/2020   K 4.0 07/13/2020   CREATININE 1.17 (H) 07/13/2020   GFRNONAA 40 (L) 07/13/2020   GFRAA 47 (L) 07/13/2020   GLUCOSE 77 07/13/2020     Lipid/Cholesterol, Follow-up  Last lipid panel Other pertinent labs  Lab Results  Component Value Date   CHOL 171 05/14/2019   HDL 45 05/14/2019   LDLCALC 83 05/14/2019   TRIG 217 (H) 05/14/2019   CHOLHDL 3.8 05/14/2019   Lab Results  Component Value Date   ALT 18 07/13/2020   AST 28 07/13/2020   PLT 173  07/13/2020   TSH 2.280 04/10/2020     She was last seen for this 6 months ago.  Management since that visit includes no medication changes.  She reports good compliance with treatment. She is not having side effects.   Symptoms: No chest pain No chest pressure/discomfort  No dyspnea No lower extremity edema  No numbness or tingling of extremity No orthopnea  No palpitations No paroxysmal nocturnal dyspnea  No speech difficulty No syncope   Current diet: well balanced Current exercise: no regular exercise  The ASCVD Risk score ([place]., et al., 2013) failed to calculate for the following reasons:   The 2013 ASCVD risk score is only valid for ages 45 to 11      Medications: Outpatient Medications Prior to Visit  Medication Sig  . ALPRAZolam (XANAX) 0.5 MG tablet TAKE 1 TABLET BY MOUTH EVERY DAY AT BEDTIME AS NEEDED  . Artificial Tear Ointment (DRY EYES OP) Apply 1 drop to eye daily.  Marland Kitchen aspirin EC 81 MG tablet Take 81 mg by mouth daily.  . B-D ULTRAFINE III SHORT PEN 31G X 8 MM MISC CHECK SUGAR 2 TIMES DAILY, DX E11.9 (NEEDS ULTRA FINE PEN NEEDLES)  . furosemide (LASIX) 20 MG tablet Take 1 tablet (20 mg total) by mouth daily  as needed. Take 1 tablet (20 mg) 2-3 times a week  . glucose blood (ONE TOUCH ULTRA TEST) test strip Check sugar twice daily.  Marland Kitchen HYDROcodone-acetaminophen (NORCO/VICODIN) 5-325 MG tablet Take 1 tablet by mouth every 6 (six) hours as needed for moderate pain.  Marland Kitchen insulin glargine (LANTUS SOLOSTAR) 100 UNIT/ML Solostar Pen INJECT 25 UNITS SUBCUTANEOUSLY  . Insulin Pen Needle (FIFTY50 PEN NEEDLES) 31G X 8 MM MISC Check sugar 2 times daily, DX E11.9 (needs ultra fine pen needles)  . isosorbide mononitrate (IMDUR) 30 MG 24 hr tablet TAKE 1 TABLET BY MOUTH EVERY DAY  . Lancets (ONETOUCH ULTRASOFT) lancets Check sugar twice daily DX E11.9  . Multiple Vitamins-Minerals (PRESERVISION AREDS) CAPS Take 1 capsule by mouth 2 (two) times daily.   . nitroGLYCERIN  (NITROSTAT) 0.4 MG SL tablet PLACE 1 TABLET (0.4 MG TOTAL) UNDER THE TONGUE EVERY 5 (FIVE) MINUTES AS NEEDED FOR CHEST PAIN.  Marland Kitchen Omega-3 Fatty Acids (FISH OIL PO) Take by mouth daily.  Marland Kitchen omeprazole (PRILOSEC) 20 MG capsule TAKE 1 CAPSULE BY MOUTH EVERY DAY  . ondansetron (ZOFRAN) 4 MG tablet Take 1 tablet (4 mg total) by mouth every 8 (eight) hours as needed for nausea or vomiting.  Glory Rosebush ULTRA test strip CHECK SUGAR TWICE A DAY  . potassium chloride (KLOR-CON) 10 MEQ tablet Take 1 tablet (10 mEq total) by mouth as directed. Take 1 tablet (10 mEq) 2-3 times a week with furosemide.  . quinapril-hydrochlorothiazide (ACCURETIC) 20-12.5 MG tablet TAKE 1 TABLET BY MOUTH TWICE A DAY (Patient taking differently: Take 1 tablet by mouth daily. )  . rosuvastatin (CRESTOR) 5 MG tablet TAKE 1 TABLET BY MOUTH EVERY DAY   No facility-administered medications prior to visit.    Review of Systems  Constitutional: Negative.   Respiratory: Negative for cough and shortness of breath.   Cardiovascular: Negative for chest pain, palpitations and leg swelling.  Endocrine: Negative for cold intolerance, heat intolerance, polydipsia, polyphagia and polyuria.  Neurological: Negative for dizziness, light-headedness and headaches.        Objective    BP (!) 135/45   Pulse 72   Temp 98 F (36.7 C)   Resp 16   Ht 5\' 4"  (1.626 m)   Wt 162 lb (73.5 kg)   BMI 27.81 kg/m  BP Readings from Last 3 Encounters:  01/18/21 (!) 135/45  01/01/21 (!) 143/49  07/27/20 115/68   Wt Readings from Last 3 Encounters:  01/18/21 162 lb (73.5 kg)  07/27/20 163 lb (73.9 kg)  07/14/20 161 lb (73 kg)       Physical Exam Vitals reviewed.  Constitutional:      Appearance: She is well-developed.  HENT:     Head: Normocephalic and atraumatic.     Right Ear: External ear normal.     Left Ear: External ear normal.     Nose: Nose normal.  Eyes:     Conjunctiva/sclera: Conjunctivae normal.     Pupils: Pupils are equal,  round, and reactive to light.  Neck:     Thyroid: No thyromegaly.  Cardiovascular:     Rate and Rhythm: Normal rate and regular rhythm.     Heart sounds: Murmur heard.      Comments: 3/6 Murmur at RUSB. Pulmonary:     Effort: Pulmonary effort is normal.     Breath sounds: Normal breath sounds.  Abdominal:     Palpations: Abdomen is soft.  Musculoskeletal:        General: Normal range of  motion.     Cervical back: Normal range of motion and neck supple.  Skin:    General: Skin is warm and dry.  Neurological:     General: No focal deficit present.     Mental Status: She is alert and oriented to person, place, and time.     Deep Tendon Reflexes: Reflexes are normal and symmetric.  Psychiatric:        Mood and Affect: Mood normal.        Behavior: Behavior normal.        Thought Content: Thought content normal.        Judgment: Judgment normal.       Results for orders placed or performed in visit on 01/18/21  POCT glycosylated hemoglobin (Hb A1C)  Result Value Ref Range   Hemoglobin A1C 6.9 (A) 4.0 - 5.6 %   HbA1c POC (<> result, manual entry)     HbA1c, POC (prediabetic range)     HbA1c, POC (controlled diabetic range)      Assessment & Plan     1. Controlled type 2 diabetes mellitus with other circulatory complication, unspecified whether long term insulin use (HCC) 1C under good control at 6.9.% - POCT glycosylated hemoglobin (Hb A1C) - CBC with Differential/Platelet  2. Benign essential HTN Good control. - Comprehensive metabolic panel  3. Hypercholesteremia On rosuvastatin 5 - Lipid panel  4. Adult hypothyroidism  - TSH   No follow-ups on file.      I, Jasmine Durie, MD, have reviewed all documentation for this visit. The documentation on 01/19/21 for the exam, diagnosis, procedures, and orders are all accurate and complete.    Richard Cranford Mon, MD  [facility] [phone] (phone) [phone] (fax)  [place]

## 2021-01-19 DIAGNOSIS — E7800 Pure hypercholesterolemia, unspecified: Secondary | ICD-10-CM | POA: Diagnosis not present

## 2021-01-19 DIAGNOSIS — E039 Hypothyroidism, unspecified: Secondary | ICD-10-CM | POA: Diagnosis not present

## 2021-01-19 DIAGNOSIS — I10 Essential (primary) hypertension: Secondary | ICD-10-CM | POA: Diagnosis not present

## 2021-01-19 DIAGNOSIS — E1159 Type 2 diabetes mellitus with other circulatory complications: Secondary | ICD-10-CM | POA: Diagnosis not present

## 2021-01-20 LAB — CBC WITH DIFFERENTIAL/PLATELET
Basophils Absolute: 0.1 10*3/uL (ref 0.0–0.2)
Basos: 1 %
EOS (ABSOLUTE): 0.2 10*3/uL (ref 0.0–0.4)
Eos: 3 %
Hematocrit: 36.5 % (ref 34.0–46.6)
Hemoglobin: 12.2 g/dL (ref 11.1–15.9)
Immature Grans (Abs): 0 10*3/uL (ref 0.0–0.1)
Immature Granulocytes: 0 %
Lymphocytes Absolute: 2.8 10*3/uL (ref 0.7–3.1)
Lymphs: 45 %
MCH: 31.8 pg (ref 26.6–33.0)
MCHC: 33.4 g/dL (ref 31.5–35.7)
MCV: 95 fL (ref 79–97)
Monocytes Absolute: 0.9 10*3/uL (ref 0.1–0.9)
Monocytes: 14 %
Neutrophils Absolute: 2.3 10*3/uL (ref 1.4–7.0)
Neutrophils: 37 %
RBC: 3.84 x10E6/uL (ref 3.77–5.28)
RDW: 12.8 % (ref 11.7–15.4)
WBC: 6.2 10*3/uL (ref 3.4–10.8)

## 2021-01-20 LAB — LIPID PANEL
Chol/HDL Ratio: 3.5 ratio (ref 0.0–4.4)
Cholesterol, Total: 185 mg/dL (ref 100–199)
HDL: 53 mg/dL (ref >39)
LDL Chol Calc (NIH): 102 mg/dL — ABNORMAL HIGH (ref 0–99)
Triglycerides: 175 mg/dL — ABNORMAL HIGH (ref 0–149)
VLDL Cholesterol Cal: 30 mg/dL (ref 5–40)

## 2021-01-20 LAB — COMPREHENSIVE METABOLIC PANEL
ALT: 18 IU/L (ref 0–32)
AST: 26 IU/L (ref 0–40)
Albumin/Globulin Ratio: 2 (ref 1.2–2.2)
Albumin: 4.2 g/dL (ref 3.5–4.6)
Alkaline Phosphatase: 120 IU/L (ref 44–121)
BUN/Creatinine Ratio: 22 (ref 12–28)
BUN: 26 mg/dL (ref 10–36)
Bilirubin Total: 0.4 mg/dL (ref 0.0–1.2)
CO2: 23 mmol/L (ref 20–29)
Calcium: 9 mg/dL (ref 8.7–10.3)
Chloride: 100 mmol/L (ref 96–106)
Creatinine, Ser: 1.2 mg/dL — ABNORMAL HIGH (ref 0.57–1.00)
Globulin, Total: 2.1 g/dL (ref 1.5–4.5)
Glucose: 98 mg/dL (ref 65–99)
Potassium: 4.2 mmol/L (ref 3.5–5.2)
Sodium: 139 mmol/L (ref 134–144)
Total Protein: 6.3 g/dL (ref 6.0–8.5)
eGFR: 42 mL/min/{1.73_m2} — ABNORMAL LOW (ref >59)

## 2021-01-20 LAB — TSH: TSH: 3.6 u[IU]/mL (ref 0.450–4.500)

## 2021-04-02 ENCOUNTER — Other Ambulatory Visit: Payer: Self-pay | Admitting: Family Medicine

## 2021-04-02 DIAGNOSIS — I209 Angina pectoris, unspecified: Secondary | ICD-10-CM

## 2021-04-02 DIAGNOSIS — R079 Chest pain, unspecified: Secondary | ICD-10-CM

## 2021-04-14 ENCOUNTER — Other Ambulatory Visit: Payer: Self-pay | Admitting: Family Medicine

## 2021-04-18 ENCOUNTER — Other Ambulatory Visit: Payer: Self-pay | Admitting: Family Medicine

## 2021-04-18 DIAGNOSIS — F419 Anxiety disorder, unspecified: Secondary | ICD-10-CM

## 2021-04-18 NOTE — Telephone Encounter (Signed)
Requested medication (s) are due for refill today: yes  Requested medication (s) are on the active medication list: yes  Last refill: 03/19/21  Future visit scheduled: yes  Notes to clinic:  not delegated    Requested Prescriptions  Pending Prescriptions Disp Refills   ALPRAZolam (XANAX) 0.5 MG tablet [Pharmacy Med Name: ALPRAZOLAM 0.5 MG TABLET] 30 tablet 3    Sig: TAKE 1 TABLET BY MOUTH EVERY DAY AT BEDTIME AS NEEDED      Not Delegated - Psychiatry:  Anxiolytics/Hypnotics Failed - 04/18/2021 10:46 AM      Failed - This refill cannot be delegated      Failed - Urine Drug Screen completed in last 360 days      Passed - Valid encounter within last 6 months    Recent Outpatient Visits           3 months ago Controlled type 2 diabetes mellitus with other circulatory complication, unspecified whether long term insulin use ([place])   [facility] Jerrol Banana., MD   8 months ago Controlled type 2 diabetes mellitus with other circulatory complication, unspecified whether long term insulin use [facility])   [facility] Jerrol Banana., MD   11 months ago Essential (primary) hypertension   [facility] Jerrol Banana., MD   1 year ago Controlled type 2 diabetes mellitus with other circulatory complication, unspecified whether long term insulin use [facility])   [facility] Jerrol Banana., MD   1 year ago Cardiovascular disease   [facility] Jerrol Banana., MD       Future Appointments             In 1 month Jerrol Banana., MD [facility]

## 2021-04-19 ENCOUNTER — Ambulatory Visit: Payer: Self-pay | Admitting: *Deleted

## 2021-04-19 ENCOUNTER — Telehealth: Payer: Self-pay

## 2021-04-19 DIAGNOSIS — M545 Low back pain, unspecified: Secondary | ICD-10-CM | POA: Diagnosis not present

## 2021-04-19 DIAGNOSIS — Z03818 Encounter for observation for suspected exposure to other biological agents ruled out: Secondary | ICD-10-CM | POA: Diagnosis not present

## 2021-04-19 DIAGNOSIS — R5383 Other fatigue: Secondary | ICD-10-CM | POA: Diagnosis not present

## 2021-04-19 DIAGNOSIS — R350 Frequency of micturition: Secondary | ICD-10-CM | POA: Diagnosis not present

## 2021-04-19 NOTE — Telephone Encounter (Signed)
Copied from [place] [id number]. Topic: Appointment Scheduling - Scheduling Inquiry for Clinic >> Apr 19, 2021 12:48 PM Oneta Rack wrote: Reason for CRM: patient experiencing lower back pain and would like her kidney check. Patient would like PCP to work her on or would like to be seen today.

## 2021-04-19 NOTE — Telephone Encounter (Signed)
Pt advised that we don't have appointments.  She is going to go to [facility] on [address].   Thanks,   -Mickel Baas

## 2021-04-19 NOTE — Telephone Encounter (Signed)
I returned pt's call.   She called in because "My family has diagnosed me having kidney or bladder trouble".  She has not felt good since last Thursday.   "I'm usually up and busy but I feel tired and out of sorts".   I asked her many different questions to figure out what is going on with her.   Her answers were vague.   She finally admitted she had diarrhea last Thursday but not since.   Last week her appetite wasn't good but it's fine now.   She mentioned she was drinking Gator Aid so she was urinating a lot.   When I asked why she was drinking Gator Aid she replied,  "My family said it would replace my electrolytes".   This is when I found out she was having diarrhea just on Thursday.   She c/o having lower back pain across her lower back yesterday that was an 8/10 on the pain scale but is fine today.   No pain at all.   She took some ibuprofen yesterday for the back pain.   "I've never had back pain like that before but I'm fine today and I feel better".  "My family thinks I should be checked out is all I know".    She does not have a history of kidney stones or UTIs.   She denies burning, frequency except with drinking Gator Aid on Thursday while having diarrhea otherwise she is "going like I normally do".   Denies dark urine, smell to her urine that's different, no itching or burning in her vaginal/bladder area.  No fever, body aches or headaches.   I asked if she had done a Covid test and she has not.   She mentioned she had Covid in Feb. 2022 "and I didn't feel like this".    I made her an appt with the first available opening  With Dr. Rosanna Randy on 05/08/2021 at 3:00 in office visit.  Covid questionnaire/question completed.   Not had Covid in last 10 days. I let her know I would see if they could see her sooner.   She was agreeable to this plan.  I sent my notes to [facility] for Dr. Rosanna Randy and to see if they can work her in sooner.  Protocol I used was for back pain which  indicated a visit within 3 days.    Reason for Disposition . [1] Age > 65 AND [2] no history of prior similar back pain  Answer Assessment - Initial Assessment Questions 1. ONSET: "When did the pain begin?"      Yesterday starting hurting in my lower back all the way across. 2. LOCATION: "Where does it hurt?" (upper, mid or lower back)     All the way across my lower back.   I've had 4 back surgeries.   3. SEVERITY: "How bad is the pain?"  (e.g., Scale 1-10; mild, moderate, or severe)   - MILD (1-3): doesn't interfere with normal activities    - MODERATE (4-7): interferes with normal activities or awakens from sleep    - SEVERE (8-10): excruciating pain, unable to do any normal activities      Yesterday 8 on pain scale.   Today none 4. PATTERN: "Is the pain constant?" (e.g., yes, no; constant, intermittent)      That's the first time I've ever had. 5. RADIATION: "Does the pain shoot into your legs or elsewhere?"     No radiating.  Just across the  bottom of my back. I haven't felt good since last Thursday.   I've been drinking Gator Aid.   I went to the bathroom all day Thur.  Yesterday I went normally.    And today urinating fine without any symptoms.     No fever.   Thursday I had diarrhea.   They told me the Gator Aid would help with my electrolytes.    My appetite is fine now as of yesterday.   Since Thursday I don't have any energy.    My family thinks it's my kidneys because my lower back hurts.    No injuries. 6. CAUSE:  "What do you think is causing the back pain?"      I have no idea. I'm just so tired and out of sorts.  My family is worried about me.   No headache, fever or sore throat.    I've had Covid in Feb. 7. BACK OVERUSE:  "Any recent lifting of heavy objects, strenuous work or exercise?"     No 8. MEDICATIONS: "What have you taken so far for the pain?" (e.g., nothing, acetaminophen, NSAIDS)     Taking ibuprofen for her back.   9. NEUROLOGIC SYMPTOMS: "Do you have any  weakness, numbness, or problems with bowel/bladder control?"     No 10. OTHER SYMPTOMS: "Do you have any other symptoms?" (e.g., fever, abdominal pain, burning with urination, blood in urine)       No bladder issues.   My family just wants me checked. 11. PREGNANCY: "Is there any chance you are pregnant?" (e.g., yes, no; LMP)       N/A  Protocols used: BACK PAIN-A-AH

## 2021-04-23 NOTE — Telephone Encounter (Signed)
Patient states she went to urgent care at [facility] clinic and was evaluated and treated on Friday 04/20/21, patient states that she feels well today and has no other concerns to address.

## 2021-04-29 ENCOUNTER — Other Ambulatory Visit: Payer: Self-pay | Admitting: Family Medicine

## 2021-04-29 DIAGNOSIS — I10 Essential (primary) hypertension: Secondary | ICD-10-CM

## 2021-04-29 NOTE — Telephone Encounter (Signed)
Requested Prescriptions  Pending Prescriptions Disp Refills  . quinapril-hydrochlorothiazide (ACCURETIC) 20-12.5 MG tablet [Pharmacy Med Name: QUINAPRIL-HCTZ 20-12.5 MG TAB] 180 tablet 0    Sig: TAKE 1 TABLET BY MOUTH TWICE A DAY     Cardiovascular:  ACEI + Diuretic Combos Failed - 04/29/2021 12:48 AM      Failed - Cr in normal range and within 180 days    Creatinine, Ser  Date Value Ref Range Status  01/19/2021 1.20 (H) 0.57 - 1.00 mg/dL Final         Failed - Last BP in normal range    BP Readings from Last 1 Encounters:  01/18/21 (!) 135/45         Passed - Na in normal range and within 180 days    Sodium  Date Value Ref Range Status  01/19/2021 139 134 - 144 mmol/L Final         Passed - K in normal range and within 180 days    Potassium  Date Value Ref Range Status  01/19/2021 4.2 3.5 - 5.2 mmol/L Final         Passed - Ca in normal range and within 180 days    Calcium  Date Value Ref Range Status  01/19/2021 9.0 8.7 - 10.3 mg/dL Final         Passed - Patient is not pregnant      Passed - Valid encounter within last 6 months    Recent Outpatient Visits          3 months ago Controlled type 2 diabetes mellitus with other circulatory complication, unspecified whether long term insulin use ([place])   [facility] Jerrol Banana., MD   9 months ago Controlled type 2 diabetes mellitus with other circulatory complication, unspecified whether long term insulin use [facility])   [facility] Jerrol Banana., MD   1 year ago Essential (primary) hypertension   [facility] Jerrol Banana., MD   1 year ago Controlled type 2 diabetes mellitus with other circulatory complication, unspecified whether long term insulin use [facility])   [facility] Jerrol Banana., MD   1 year ago Cardiovascular disease   [facility] Jerrol Banana., MD      Future Appointments            In 1 week  Jerrol Banana., MD [facility], [place]   In 1 month Jerrol Banana., MD [facility], [place]

## 2021-04-30 DIAGNOSIS — Z8719 Personal history of other diseases of the digestive system: Secondary | ICD-10-CM | POA: Diagnosis not present

## 2021-04-30 DIAGNOSIS — Z794 Long term (current) use of insulin: Secondary | ICD-10-CM | POA: Diagnosis not present

## 2021-04-30 DIAGNOSIS — E119 Type 2 diabetes mellitus without complications: Secondary | ICD-10-CM | POA: Diagnosis not present

## 2021-05-03 ENCOUNTER — Other Ambulatory Visit: Payer: Self-pay | Admitting: Cardiovascular Disease

## 2021-05-08 ENCOUNTER — Encounter: Payer: Self-pay | Admitting: Family Medicine

## 2021-05-08 ENCOUNTER — Other Ambulatory Visit: Payer: Self-pay

## 2021-05-08 ENCOUNTER — Ambulatory Visit (INDEPENDENT_AMBULATORY_CARE_PROVIDER_SITE_OTHER): Payer: PPO | Admitting: Family Medicine

## 2021-05-08 VITALS — BP 104/58 | HR 69 | Temp 98.3°F | Resp 18 | Ht 64.0 in | Wt 160.0 lb

## 2021-05-08 DIAGNOSIS — E782 Mixed hyperlipidemia: Secondary | ICD-10-CM | POA: Diagnosis not present

## 2021-05-08 DIAGNOSIS — I2510 Atherosclerotic heart disease of native coronary artery without angina pectoris: Secondary | ICD-10-CM | POA: Diagnosis not present

## 2021-05-08 DIAGNOSIS — I25118 Atherosclerotic heart disease of native coronary artery with other forms of angina pectoris: Secondary | ICD-10-CM | POA: Diagnosis not present

## 2021-05-08 DIAGNOSIS — E1159 Type 2 diabetes mellitus with other circulatory complications: Secondary | ICD-10-CM | POA: Diagnosis not present

## 2021-05-08 DIAGNOSIS — I350 Nonrheumatic aortic (valve) stenosis: Secondary | ICD-10-CM

## 2021-05-08 DIAGNOSIS — F411 Generalized anxiety disorder: Secondary | ICD-10-CM | POA: Diagnosis not present

## 2021-05-08 DIAGNOSIS — E039 Hypothyroidism, unspecified: Secondary | ICD-10-CM

## 2021-05-08 DIAGNOSIS — I10 Essential (primary) hypertension: Secondary | ICD-10-CM | POA: Diagnosis not present

## 2021-05-08 LAB — POCT GLYCOSYLATED HEMOGLOBIN (HGB A1C)
Est. average glucose Bld gHb Est-mCnc: 151
Hemoglobin A1C: 6.9 % — AB (ref 4.0–5.6)

## 2021-05-08 NOTE — Progress Notes (Signed)
I,[name],acting as a scribe for [name], MD.,have documented all relevant documentation on the behalf of [name], MD,as directed by  [name], MD while in the presence of [name], MD.   Established patient visit   Patient: Jasmine Webster   DOB: 03/02/1926   87 y.o. Female  MRN: [id number] Visit Date: 05/08/2021  Today's healthcare provider: [name], MD   Chief Complaint  Patient presents with   Follow-up   Diabetes   Hypertension   Subjective    HPI  Patient states that she is "usually good".  Specific complaints.  No recent falls.  Taking her medications as prescribed. Diabetes Mellitus Type II, follow-up  Lab Results  Component Value Date   HGBA1C 6.9 (A) 01/18/2021   HGBA1C 6.7 (A) 07/27/2020   HGBA1C 6.9 (H) 04/10/2020   Last seen for diabetes 3 months ago.  Management since then includes continuing the same treatment. She reports good compliance with treatment. She is not having side effects. none  Home blood sugar records: fasting range: 100 210  Episodes of hypoglycemia? No 106-112   Current insulin regiment: Lantus Most Recent Eye Exam: Due  ---------------------------------------------------------------------------- Hypertension, follow-up  BP Readings from Last 3 Encounters:  05/08/21 (!) 104/58  01/18/21 (!) 135/45  01/01/21 (!) 143/49   Wt Readings from Last 3 Encounters:  05/08/21 160 lb (72.6 kg)  01/18/21 162 lb (73.5 kg)  07/27/20 163 lb (73.9 kg)     She was last seen for hypertension 3 months ago.  BP at that visit was 135/45. Management since that visit includes; . She reports good compliance with treatment. She is not having side effects. none She is not exercising. She is adherent to low salt diet.   Outside blood pressures are 120/70.  She does not smoke.  Use of agents associated with hypertension: none.    ---------------------------------------------------------------------------        Medications: Outpatient Medications Prior to Visit  Medication Sig   ALPRAZolam (XANAX) 0.5 MG tablet TAKE 1 TABLET BY MOUTH EVERY DAY AT BEDTIME AS NEEDED   Artificial Tear Ointment (DRY EYES OP) Apply 1 drop to eye daily.   aspirin EC 81 MG tablet Take 81 mg by mouth daily.   B-D ULTRAFINE III SHORT PEN 31G X 8 MM MISC CHECK SUGAR 2 TIMES DAILY, DX E11.9 (NEEDS ULTRA FINE PEN NEEDLES)   furosemide (LASIX) 20 MG tablet TAKE 1 TABLET (20 MG TOTAL) BY MOUTH AS DIRECTED. TAKE 1 TABLET (20 MG) 2-3 TIMES A WEEK   glucose blood (ONE TOUCH ULTRA TEST) test strip Check sugar twice daily.   insulin glargine (LANTUS SOLOSTAR) 100 UNIT/ML Solostar Pen INJECT 25 UNITS SUBCUTANEOUSLY   Insulin Pen Needle 31G X 8 MM MISC Check sugar 2 times daily, DX E11.9 (needs ultra fine pen needles)   isosorbide mononitrate (IMDUR) 30 MG 24 hr tablet TAKE 1 TABLET BY MOUTH EVERY DAY   Lancets (ONETOUCH ULTRASOFT) lancets Check sugar twice daily DX E11.9   Multiple Vitamins-Minerals (PRESERVISION AREDS) CAPS Take 1 capsule by mouth 2 (two) times daily.    nitroGLYCERIN (NITROSTAT) 0.4 MG SL tablet PLACE 1 TABLET (0.4 MG TOTAL) UNDER THE TONGUE EVERY 5 (FIVE) MINUTES AS NEEDED FOR CHEST PAIN.   Omega-3 Fatty Acids (FISH OIL PO) Take by mouth daily.   omeprazole (PRILOSEC) 20 MG capsule TAKE 1 CAPSULE BY MOUTH EVERY DAY   ondansetron (ZOFRAN) 4 MG tablet Take 1 tablet (4  mg total) by mouth every 8 (eight) hours as needed for nausea or vomiting.   ONETOUCH ULTRA test strip CHECK SUGAR TWICE A DAY   potassium chloride (KLOR-CON) 10 MEQ tablet Take 1 tablet (10 mEq total) by mouth as directed. Take 1 tablet (10 mEq) 2-3 times a week with furosemide.   quinapril-hydrochlorothiazide (ACCURETIC) 20-12.5 MG tablet TAKE 1 TABLET BY MOUTH TWICE A DAY   rosuvastatin (CRESTOR) 5 MG tablet TAKE 1 TABLET BY MOUTH EVERY DAY    HYDROcodone-acetaminophen (NORCO/VICODIN) 5-325 MG tablet Take 1 tablet by mouth every 6 (six) hours as needed for moderate pain. (Patient not taking: Reported on 05/08/2021)   No facility-administered medications prior to visit.    Review of Systems      Objective    BP (!) 104/58 (BP Location: Right Arm, Patient Position: Sitting, Cuff Size: Normal)   Pulse 69   Temp 98.3 F (36.8 C) (Oral)   Resp 18   Ht 5\' 4"  (1.626 m)   Wt 160 lb (72.6 kg)   SpO2 97%   BMI 27.46 kg/m  BP Readings from Last 3 Encounters:  05/08/21 (!) 104/58  01/18/21 (!) 135/45  01/01/21 (!) 143/49   Wt Readings from Last 3 Encounters:  05/08/21 160 lb (72.6 kg)  01/18/21 162 lb (73.5 kg)  07/27/20 163 lb (73.9 kg)       Physical Exam Vitals reviewed.  Constitutional:      Appearance: She is well-developed.  HENT:     Head: Normocephalic and atraumatic.     Right Ear: External ear normal.     Left Ear: External ear normal.     Nose: Nose normal.  Eyes:     Conjunctiva/sclera: Conjunctivae normal.     Pupils: Pupils are equal, round, and reactive to light.  Neck:     Thyroid: No thyromegaly.  Cardiovascular:     Rate and Rhythm: Normal rate and regular rhythm.     Heart sounds: Murmur heard.     Comments: 3/6 Murmur at RUSB. Pulmonary:     Effort: Pulmonary effort is normal.     Breath sounds: Normal breath sounds.  Abdominal:     Palpations: Abdomen is soft.  Musculoskeletal:     Cervical back: Normal range of motion and neck supple.  Skin:    General: Skin is warm and dry.  Neurological:     General: No focal deficit present.     Mental Status: She is alert and oriented to person, place, and time.     Deep Tendon Reflexes: Reflexes are normal and symmetric.  Psychiatric:        Mood and Affect: Mood normal.        Behavior: Behavior normal.        Thought Content: Thought content normal.        Judgment: Judgment normal.      No results found for any visits on 05/08/21.   Assessment & Plan     1. Controlled type 2 diabetes mellitus with other circulatory complication, unspecified whether long term insulin use ([facility]) C of 6.9 on sling. - POCT glycosylated hemoglobin (Hb A1C)  2. Benign essential HTN Controlled on quinapril HCT  3. Cardiovascular disease On their daily for anginal relief with good control all risk factors treated Rosuvastatin 5 mg daily 4. Coronary artery disease of native artery of native heart with stable angina pectoris ([place])   5. Adult hypothyroidism TSH. 6. Mixed hyperlipidemia On rosuvastatin 5  7. Nonrheumatic aortic  valve stenosis Valve disease followed by cardiology  8. GAD (generalized anxiety disorder) Diazepam as needed carefully in the store 87 year old   No follow-ups on file.      I, [name], MD, have reviewed all documentation for this visit. The documentation on 05/13/21 for the exam, diagnosis, procedures, and orders are all accurate and complete.    Breshae Belcher Cranford Mon, MD  [facility] [phone] (phone) [phone] (fax)  [place]

## 2021-05-10 ENCOUNTER — Telehealth: Payer: Self-pay

## 2021-05-10 NOTE — Telephone Encounter (Signed)
Advised daughter as below.  

## 2021-05-10 NOTE — Telephone Encounter (Signed)
Did you want the patient to stay on cholesterol medication?

## 2021-05-10 NOTE — Telephone Encounter (Signed)
Copied from [place] [id number]. Topic: General - Other >> May 10, 2021 10:57 AM Keene Breath wrote: Reason for CRM: Patient's daughter called to inform the doctor that patient's legs are weak and tired.  She would like to change the patient's cholesterol medication to see if that is the cause.  Please advise and call to discuss at [phone]

## 2021-05-31 ENCOUNTER — Telehealth: Payer: Self-pay | Admitting: *Deleted

## 2021-05-31 NOTE — Telephone Encounter (Signed)
error 

## 2021-06-04 ENCOUNTER — Ambulatory Visit: Payer: Self-pay | Admitting: Family Medicine

## 2021-06-05 DIAGNOSIS — D2261 Melanocytic nevi of right upper limb, including shoulder: Secondary | ICD-10-CM | POA: Diagnosis not present

## 2021-06-05 DIAGNOSIS — D2272 Melanocytic nevi of left lower limb, including hip: Secondary | ICD-10-CM | POA: Diagnosis not present

## 2021-06-05 DIAGNOSIS — D2262 Melanocytic nevi of left upper limb, including shoulder: Secondary | ICD-10-CM | POA: Diagnosis not present

## 2021-06-05 DIAGNOSIS — D2271 Melanocytic nevi of right lower limb, including hip: Secondary | ICD-10-CM | POA: Diagnosis not present

## 2021-06-05 DIAGNOSIS — D485 Neoplasm of uncertain behavior of skin: Secondary | ICD-10-CM | POA: Diagnosis not present

## 2021-06-05 DIAGNOSIS — L538 Other specified erythematous conditions: Secondary | ICD-10-CM | POA: Diagnosis not present

## 2021-06-05 DIAGNOSIS — D225 Melanocytic nevi of trunk: Secondary | ICD-10-CM | POA: Diagnosis not present

## 2021-06-05 DIAGNOSIS — D0439 Carcinoma in situ of skin of other parts of face: Secondary | ICD-10-CM | POA: Diagnosis not present

## 2021-06-05 DIAGNOSIS — L821 Other seborrheic keratosis: Secondary | ICD-10-CM | POA: Diagnosis not present

## 2021-07-01 NOTE — Progress Notes (Signed)
Date:  07/02/2021   ID:  [name], DOB Mar 29, 1926, MRN [id number]  Patient Location:  [address] [place] [address]   Provider location:   [facility], [place] office  PCP:  Jerrol Banana., MD  Cardiologist:  [facility]  Chief Complaint  Patient presents with   12 month follow up     Patient c/o shortness of breath with little to no exertion with occasional chest heaviness. Medications reviewed by the patient verbally.     History of Present Illness:    Jasmine Webster is a 87 y.o. female  past medical history of Diabetes type 2, hemoglobin A1c 6.8 GERD Hyperlipidemia Hypertension Arthritis Non smoker Who presents for chest pain on exertion, angina, murmur Echocardiogram confirming moderate aortic valve stenosis, elevated right heart pressures  Presents today with family Bp at home :good" 120/60 at home  Cough, wonders if from quinapril  Shortness of breath getting worse Can hardly walk anywhere without having to stop, catch her breath Family very concerned as symptoms seem to be quite significant  Denies any leg swelling or abdominal distention Does not take Lasix Previously had some cramping on Lasix  Previous studies reviewed on today's visit echocardiogram February 23, 2020  Moderate aortic valve stenosis Valve velocity 3.59 m/s, mean gradient 25.41mmHg Moderately elevated right heart pressures  Sedentary at baseline, weak legs, gait instability Prior history shortness of breath with exertion/rushing Does better in the house Does house chores, does laundry  Patient is widowed, no smoker  Previous inconsistency with taking her Crestor  Lab work reviewed HBA1C 6.9   EKG personally reviewed by myself on todays visit Shows normal sinus rhythm rate 71 bpm, left bundle branch block  Prior CV studies:   The following studies were reviewed today:  Echo March 2020  1. The left ventricle has normal systolic  function with an ejection fraction of 60-65%. The cavity size was normal. There is mildly increased left ventricular wall thickness. Left ventricular diastolic Doppler parameters are consistent with impaired  relaxation.  2. The right ventricle has normal systolic function. The cavity was normal. There is no increase in right ventricular wall thickness. Right ventricular systolic pressure is severely elevated with an estimated pressure of 62.0 mmHg.  3. Tricuspid valve regurgitation is mild-moderate.  4. Mmoderate stenosis of the aortic valve. Mean gradient 26 mm Hg   Past Medical History:  Diagnosis Date   Arthritis    Cataract    cataract removal bilaterally approx 20 years ago   Chronic back pain    Coronary artery disease    COVID-19 virus infection 12/2020   Diabetes mellitus without complication (HCC)    Heart murmur    Hyperlipidemia    Hypertension    Past Surgical History:  Procedure Laterality Date   ABDOMINAL HYSTERECTOMY     APPENDECTOMY     BACK SURGERY     three   CARDIAC CATHETERIZATION     6+yrs no stents Dr. Verneda Skill   CATARACT EXTRACTION     THORACOLUMBAR SYRINGO SHUNT     TONSILLECTOMY AND ADENOIDECTOMY       Current Meds  Medication Sig   ALPRAZolam (XANAX) 0.5 MG tablet TAKE 1 TABLET BY MOUTH EVERY DAY AT BEDTIME AS NEEDED   Artificial Tear Ointment (DRY EYES OP) Apply 1 drop to eye daily.   aspirin EC 81 MG tablet Take 81 mg by mouth daily.   B-D ULTRAFINE III SHORT  PEN 31G X 8 MM MISC CHECK SUGAR 2 TIMES DAILY, DX E11.9 (NEEDS ULTRA FINE PEN NEEDLES)   furosemide (LASIX) 20 MG tablet TAKE 1 TABLET (20 MG TOTAL) BY MOUTH AS DIRECTED. TAKE 1 TABLET (20 MG) 2-3 TIMES A WEEK   glucose blood (ONE TOUCH ULTRA TEST) test strip Check sugar twice daily.   insulin glargine (LANTUS SOLOSTAR) 100 UNIT/ML Solostar Pen INJECT 25 UNITS SUBCUTANEOUSLY   Insulin Pen Needle 31G X 8 MM MISC Check sugar 2 times daily, DX E11.9 (needs ultra fine pen needles)    isosorbide mononitrate (IMDUR) 30 MG 24 hr tablet TAKE 1 TABLET BY MOUTH EVERY DAY   Lancets (ONETOUCH ULTRASOFT) lancets Check sugar twice daily DX E11.9   Magnesium 400 MG CAPS Take 400 mg by mouth daily.   Multiple Vitamins-Minerals (PRESERVISION AREDS) CAPS Take 1 capsule by mouth 2 (two) times daily.    nitroGLYCERIN (NITROSTAT) 0.4 MG SL tablet PLACE 1 TABLET (0.4 MG TOTAL) UNDER THE TONGUE EVERY 5 (FIVE) MINUTES AS NEEDED FOR CHEST PAIN.   Omega-3 Fatty Acids (FISH OIL PO) Take by mouth daily.   omeprazole (PRILOSEC) 20 MG capsule TAKE 1 CAPSULE BY MOUTH EVERY DAY   ondansetron (ZOFRAN) 4 MG tablet Take 1 tablet (4 mg total) by mouth every 8 (eight) hours as needed for nausea or vomiting.   ONETOUCH ULTRA test strip CHECK SUGAR TWICE A DAY   quinapril-hydrochlorothiazide (ACCURETIC) 20-12.5 MG tablet TAKE 1 TABLET BY MOUTH TWICE A DAY     Allergies:   Etodolac, Naproxen, Statins, Latex, Terbinafine, and Terbinafine hcl   Social History   Tobacco Use   Smoking status: Never   Smokeless tobacco: Never  Vaping Use   Vaping Use: Never used  Substance Use Topics   Alcohol use: No    Alcohol/week: 0.0 standard drinks   Drug use: No     Current Outpatient Medications on File Prior to Visit  Medication Sig Dispense Refill   ALPRAZolam (XANAX) 0.5 MG tablet TAKE 1 TABLET BY MOUTH EVERY DAY AT BEDTIME AS NEEDED 30 tablet 3   Artificial Tear Ointment (DRY EYES OP) Apply 1 drop to eye daily.     aspirin EC 81 MG tablet Take 81 mg by mouth daily.     B-D ULTRAFINE III SHORT PEN 31G X 8 MM MISC CHECK SUGAR 2 TIMES DAILY, DX E11.9 (NEEDS ULTRA FINE PEN NEEDLES) 100 each 12   furosemide (LASIX) 20 MG tablet TAKE 1 TABLET (20 MG TOTAL) BY MOUTH AS DIRECTED. TAKE 1 TABLET (20 MG) 2-3 TIMES A WEEK 36 tablet 3   glucose blood (ONE TOUCH ULTRA TEST) test strip Check sugar twice daily. 100 each 3   insulin glargine (LANTUS SOLOSTAR) 100 UNIT/ML Solostar Pen INJECT 25 UNITS SUBCUTANEOUSLY 45 mL 5    Insulin Pen Needle 31G X 8 MM MISC Check sugar 2 times daily, DX E11.9 (needs ultra fine pen needles)     isosorbide mononitrate (IMDUR) 30 MG 24 hr tablet TAKE 1 TABLET BY MOUTH EVERY DAY 90 tablet 1   Lancets (ONETOUCH ULTRASOFT) lancets Check sugar twice daily DX E11.9 100 each 12   Magnesium 400 MG CAPS Take 400 mg by mouth daily.     Multiple Vitamins-Minerals (PRESERVISION AREDS) CAPS Take 1 capsule by mouth 2 (two) times daily.      nitroGLYCERIN (NITROSTAT) 0.4 MG SL tablet PLACE 1 TABLET (0.4 MG TOTAL) UNDER THE TONGUE EVERY 5 (FIVE) MINUTES AS NEEDED FOR CHEST PAIN. 75 tablet 1  Omega-3 Fatty Acids (FISH OIL PO) Take by mouth daily.     omeprazole (PRILOSEC) 20 MG capsule TAKE 1 CAPSULE BY MOUTH EVERY DAY 90 capsule 1   ondansetron (ZOFRAN) 4 MG tablet Take 1 tablet (4 mg total) by mouth every 8 (eight) hours as needed for nausea or vomiting. 20 tablet 0   ONETOUCH ULTRA test strip CHECK SUGAR TWICE A DAY 200 strip 3   quinapril-hydrochlorothiazide (ACCURETIC) 20-12.5 MG tablet TAKE 1 TABLET BY MOUTH TWICE A DAY 180 tablet 0   HYDROcodone-acetaminophen (NORCO/VICODIN) 5-325 MG tablet Take 1 tablet by mouth every 6 (six) hours as needed for moderate pain. (Patient not taking: No sig reported)     potassium chloride (KLOR-CON) 10 MEQ tablet Take 1 tablet (10 mEq total) by mouth as directed. Take 1 tablet (10 mEq) 2-3 times a week with furosemide. (Patient not taking: Reported on 07/02/2021) 36 tablet 6   rosuvastatin (CRESTOR) 5 MG tablet TAKE 1 TABLET BY MOUTH EVERY DAY (Patient not taking: Reported on 07/02/2021) 90 tablet 2   No current facility-administered medications on file prior to visit.     Family Hx: The patient's family history includes Cancer in her brother and brother; Diabetes in her father; Heart attack in her mother; Heart disease in her mother and sister; Pneumonia in her father; Stroke in her brother, father, and sister; Sudden death in her brother, sister, and  sister.  ROS:   Please see the history of present illness.    Review of Systems  Constitutional: Negative.   Respiratory:  Positive for shortness of breath.   Cardiovascular: Negative.   Gastrointestinal: Negative.   Musculoskeletal:  Positive for back pain.  Neurological: Negative.   Psychiatric/Behavioral: Negative.    All other systems reviewed and are negative.   Labs/Other Tests and Data Reviewed:    Recent Labs: 07/13/2020: B Natriuretic Peptide 90.4 01/19/2021: ALT 18; BUN 26; Creatinine, Ser 1.20; Hemoglobin 12.2; Platelets CANCELED; Potassium 4.2; Sodium 139; TSH 3.600   Recent Lipid Panel Lab Results  Component Value Date/Time   CHOL 185 01/19/2021 08:05 AM   TRIG 175 (H) 01/19/2021 08:05 AM   HDL 53 01/19/2021 08:05 AM   CHOLHDL 3.5 01/19/2021 08:05 AM   LDLCALC 102 (H) 01/19/2021 08:05 AM    Wt Readings from Last 3 Encounters:  07/02/21 159 lb 5 oz (72.3 kg)  05/08/21 160 lb (72.6 kg)  01/18/21 162 lb (73.5 kg)     Exam:    BP (!) 166/66 (BP Location: Left Arm, Patient Position: Sitting, Cuff Size: Normal)   Pulse 71   Ht 5\' 4"  (1.626 m)   Wt 159 lb 5 oz (72.3 kg)   SpO2 98%   BMI 27.35 kg/m  Constitutional:  oriented to person, place, and time. No distress.  HENT:  Head: Grossly normal Eyes:  no discharge. No scleral icterus.  Neck: No JVD, no carotid bruits  Cardiovascular: Regular rate and rhythm, no murmurs appreciated Pulmonary/Chest: Clear to auscultation bilaterally, no wheezes or rails Abdominal: Soft.  no distension.  no tenderness.  Musculoskeletal: Normal range of motion Neurological:  normal muscle tone. Coordination normal. No atrophy Skin: Skin warm and dry Psychiatric: normal affect, pleasant   ASSESSMENT & PLAN:    Aortic valve stenosis, etiology of cardiac valve disease unspecified  Long discussion concerning echocardiogram findings We have ordered repeat echocardiogram given her shortness of breath -Discussed various treatment  options for aortic valve stenosis including TAVR  Pulmonary hypertension (HCC) -  Stable renal  function, not really taking Lasix Worsening shortness of breath symptoms Repeat echocardiogram ordered  Hyperlipidemia Recommend she stay on her Crestor Numbers improving  Hypertension Blood pressure is well controlled on today's visit. No changes made to the medications.  Left bundle branch block Dates back several years  Long discussion concerning shortness of breath, aortic valve stenosis, work-up available  Total encounter time more than 35 minutes  Greater than 50% was spent in counseling and coordination of care with the patient    Signed, Ida Rogue, MD  07/02/2021 9:01 AM    [place] Office [address], [place], [place] [address]

## 2021-07-02 ENCOUNTER — Encounter: Payer: Self-pay | Admitting: Cardiovascular Disease

## 2021-07-02 ENCOUNTER — Ambulatory Visit: Payer: PPO | Admitting: Cardiovascular Disease

## 2021-07-02 ENCOUNTER — Other Ambulatory Visit: Payer: Self-pay

## 2021-07-02 VITALS — BP 166/66 | HR 71 | Ht 64.0 in | Wt 159.3 lb

## 2021-07-02 DIAGNOSIS — I272 Pulmonary hypertension, unspecified: Secondary | ICD-10-CM | POA: Diagnosis not present

## 2021-07-02 DIAGNOSIS — I10 Essential (primary) hypertension: Secondary | ICD-10-CM | POA: Diagnosis not present

## 2021-07-02 DIAGNOSIS — Z01810 Encounter for preprocedural cardiovascular examination: Secondary | ICD-10-CM | POA: Diagnosis not present

## 2021-07-02 DIAGNOSIS — E782 Mixed hyperlipidemia: Secondary | ICD-10-CM | POA: Diagnosis not present

## 2021-07-02 DIAGNOSIS — R079 Chest pain, unspecified: Secondary | ICD-10-CM

## 2021-07-02 DIAGNOSIS — R0789 Other chest pain: Secondary | ICD-10-CM | POA: Diagnosis not present

## 2021-07-02 DIAGNOSIS — I209 Angina pectoris, unspecified: Secondary | ICD-10-CM

## 2021-07-02 DIAGNOSIS — E118 Type 2 diabetes mellitus with unspecified complications: Secondary | ICD-10-CM

## 2021-07-02 DIAGNOSIS — Z794 Long term (current) use of insulin: Secondary | ICD-10-CM | POA: Diagnosis not present

## 2021-07-02 DIAGNOSIS — R0602 Shortness of breath: Secondary | ICD-10-CM

## 2021-07-02 DIAGNOSIS — I350 Nonrheumatic aortic (valve) stenosis: Secondary | ICD-10-CM

## 2021-07-02 MED ORDER — ROSUVASTATIN CALCIUM 5 MG PO TABS: 5.0000 mg | ORAL_TABLET | Freq: Every day | ORAL | 3 refills | Status: DC

## 2021-07-02 MED ORDER — LOSARTAN POTASSIUM-HCTZ 50-12.5 MG PO TABS: 1.0000 | ORAL_TABLET | Freq: Every day | ORAL | 3 refills | Status: DC

## 2021-07-02 MED ORDER — ISOSORBIDE MONONITRATE ER 30 MG PO TB24: 30.0000 mg | ORAL_TABLET | Freq: Every day | ORAL | 3 refills | Status: DC

## 2021-07-02 MED ORDER — POTASSIUM CHLORIDE ER 10 MEQ PO TBCR: 10.0000 meq | EXTENDED_RELEASE_TABLET | ORAL | 3 refills | Status: DC

## 2021-07-02 MED ORDER — NITROGLYCERIN 0.4 MG SL SUBL: 0.4000 mg | SUBLINGUAL_TABLET | SUBLINGUAL | 3 refills | Status: DC | PRN

## 2021-07-02 MED ORDER — METOPROLOL TARTRATE 100 MG PO TABS: 100.0000 mg | ORAL_TABLET | Freq: Once | ORAL | 0 refills | Status: DC

## 2021-07-02 MED ORDER — FUROSEMIDE 20 MG PO TABS: ORAL_TABLET | ORAL | 3 refills | Status: DC

## 2021-07-02 NOTE — Patient Instructions (Addendum)
Medication Instructions:  Please STOP Quinapril  Please START losartan-HCTZ 50/12.5 once daily  If you need a refill on your cardiac medications before your next appointment, please call your pharmacy.   Lab work: [employer]  Testing/Procedures: Echocardiogram  Cardiac CTA (Thursday 8/25, [place] will call for time)  Follow-Up: At [facility], you and your health needs are our priority.  As part of our continuing mission to provide you with exceptional heart care, we have created designated Provider Care Teams.  These Care Teams include your primary Cardiologist (physician) and Advanced Practice Providers (APPs -  Physician Assistants and Nurse Practitioners) who all work together to provide you with the care you need, when you need it.  You will need a follow up appointment in 12 months  Providers on your designated Care Team:   Murray Hodgkins, NP Christell Faith, PA-C Marrianne Mood, PA-C Cadence Truckee, [place]  COVID-19 Vaccine Information can be found at: ShippingScam.co.uk For questions related to vaccine distribution or appointments, please email [email or web address] or call [phone].   [facility] [address] [place], [place] [address] [phone]   Please arrive 15 mins early for check-in and test prep.  On the Night Before the Test: Be sure to Drink plenty of water. Do not consume any caffeinated/decaffeinated beverages or chocolate 12 hours prior to your test. This includes decaf coffee as well Do not take any antihistamines 12 hours prior to your test. No Benadryl  On the Day of the Test: Drink plenty of water until 1 hour prior to the test. Do not eat any food 4 hours prior to the test. You may take your regular medications prior to the test.  Take metoprolol (Lopressor) two hours prior to test. HOLD Furosemide/Hydrochlorothiazide morning of the  test. FEMALES- please wear underwire-free bra if available       After the Test: Drink plenty of water. After receiving IV contrast, you may experience a mild flushed feeling. This is normal. On occasion, you may experience a mild rash up to 24 hours after the test. This is not dangerous. If this occurs, you can take Benadryl 25 mg and increase your fluid intake. If you experience trouble breathing, this can be serious. If it is severe call 911 IMMEDIATELY. If it is mild, please call our office. If you take any of these medications: Glipizide/Metformin, Avandament, Glucavance, please do not take 48 hours after completing test unless otherwise instructed.   For scheduling needs, including cancellations and rescheduling, please call [place], [phone].

## 2021-07-03 ENCOUNTER — Other Ambulatory Visit: Payer: Self-pay | Admitting: Family Medicine

## 2021-07-03 DIAGNOSIS — I209 Angina pectoris, unspecified: Secondary | ICD-10-CM

## 2021-07-03 LAB — BASIC METABOLIC PANEL
BUN/Creatinine Ratio: 25 (ref 12–28)
BUN: 23 mg/dL (ref 10–36)
CO2: 25 mmol/L (ref 20–29)
Calcium: 9.5 mg/dL (ref 8.7–10.3)
Chloride: 93 mmol/L — ABNORMAL LOW (ref 96–106)
Creatinine, Ser: 0.92 mg/dL (ref 0.57–1.00)
Glucose: 99 mg/dL (ref 65–99)
Potassium: 4.6 mmol/L (ref 3.5–5.2)
Sodium: 132 mmol/L — ABNORMAL LOW (ref 134–144)
eGFR: 58 mL/min/{1.73_m2} — ABNORMAL LOW (ref >59)

## 2021-07-04 NOTE — Telephone Encounter (Signed)
  Notes to clinic:  medication was filled 2 days ago by different provider Refill should not be needed      Requested Prescriptions  Pending Prescriptions Disp Refills   isosorbide mononitrate (IMDUR) 30 MG 24 hr tablet [Pharmacy Med Name: ISOSORBIDE MONONIT ER 30 MG TB] 90 tablet 1    Sig: TAKE 1 TABLET BY MOUTH EVERY DAY     Cardiovascular:  Nitrates Failed - 07/03/2021  8:52 PM      Failed - Last BP in normal range    BP Readings from Last 1 Encounters:  07/02/21 (!) 166/66          Passed - Last Heart Rate in normal range    Pulse Readings from Last 1 Encounters:  07/02/21 71          Passed - Valid encounter within last 12 months    Recent Outpatient Visits           1 month ago Controlled type 2 diabetes mellitus with other circulatory complication, unspecified whether long term insulin use ([place])   [facility] Jerrol Banana., MD   5 months ago Controlled type 2 diabetes mellitus with other circulatory complication, unspecified whether long term insulin use [facility])   [facility] Jerrol Banana., MD   11 months ago Controlled type 2 diabetes mellitus with other circulatory complication, unspecified whether long term insulin use [facility])   [facility] Jerrol Banana., MD   1 year ago Essential (primary) hypertension   [facility] Jerrol Banana., MD   1 year ago Controlled type 2 diabetes mellitus with other circulatory complication, unspecified whether long term insulin use [facility])   [facility] Jerrol Banana., MD       Future Appointments             In 2 months Jerrol Banana., MD [facility]

## 2021-07-09 ENCOUNTER — Telehealth: Payer: Self-pay | Admitting: Cardiovascular Disease

## 2021-07-09 NOTE — Telephone Encounter (Signed)
Notified pt of preliminary lab results.  BMET prior to CCTA that is scheduled this week 8/25.  Notified pt ok to proceed with CCTA d/t preliminary lab results.  Pt appreciative and voiced understanding.

## 2021-07-09 NOTE — Telephone Encounter (Signed)
Please call with lab results 

## 2021-07-10 ENCOUNTER — Telehealth (HOSPITAL_COMMUNITY): Payer: Self-pay | Admitting: *Deleted

## 2021-07-10 NOTE — Telephone Encounter (Signed)
Reaching out to patient to offer assistance regarding upcoming cardiac imaging study; pt verbalizes understanding of appt date/time, parking situation and where to check in, pre-test NPO status and medications ordered, and verified current allergies; name and call back number provided for further questions should they arise  Gordy Clement RN Navigator Cardiac Imaging [facility] [phone] office [phone] cell  Patient to take 100mg  metoprolol tartrate two hours prior to cardiac CT and her Xanax if appropriate for scan.  She states that her daughter will be taking her to the appointment.

## 2021-07-12 ENCOUNTER — Other Ambulatory Visit: Payer: Self-pay

## 2021-07-12 ENCOUNTER — Ambulatory Visit
Admission: RE | Admit: 2021-07-12 | Discharge: 2021-07-12 | Disposition: A | Discharge status: Home / Self Care | Payer: PPO | Source: Ambulatory Visit | Attending: Cardiovascular Disease | Admitting: Cardiovascular Disease

## 2021-07-12 ENCOUNTER — Telehealth: Payer: Self-pay | Admitting: Cardiovascular Disease

## 2021-07-12 DIAGNOSIS — R0602 Shortness of breath: Secondary | ICD-10-CM | POA: Insufficient documentation

## 2021-07-12 DIAGNOSIS — I350 Nonrheumatic aortic (valve) stenosis: Secondary | ICD-10-CM | POA: Insufficient documentation

## 2021-07-12 DIAGNOSIS — I209 Angina pectoris, unspecified: Secondary | ICD-10-CM | POA: Insufficient documentation

## 2021-07-12 DIAGNOSIS — I272 Pulmonary hypertension, unspecified: Secondary | ICD-10-CM | POA: Diagnosis not present

## 2021-07-12 DIAGNOSIS — R0789 Other chest pain: Secondary | ICD-10-CM | POA: Diagnosis not present

## 2021-07-12 DIAGNOSIS — I10 Essential (primary) hypertension: Secondary | ICD-10-CM

## 2021-07-12 MED ORDER — IOHEXOL 350 MG/ML SOLN
75.0000 mL | Freq: Once | INTRAVENOUS | Status: AC | PRN
  Administered 2021-07-12: 75 mL via INTRAVENOUS

## 2021-07-12 MED ORDER — NITROGLYCERIN 0.4 MG SL SUBL
0.4000 mg | SUBLINGUAL_TABLET | Freq: Once | SUBLINGUAL | Status: AC
  Administered 2021-07-12: 0.4 mg via SUBLINGUAL

## 2021-07-12 NOTE — Telephone Encounter (Signed)
Was able to reach back to Jasmine Webster regarding her concern for her legs weakness and aching. Pt reports since coming off Quinapril and switching to Losartan 10 days ago her legs have "been giving me a fit".  Noted in Dr. Rockey Situ notes, switch medications to see if pt's cough improved coming off the Quinapril, pt stated has seen no difference in her cough, feels that she wants to go back on Quinapril. Advised would stop the losartan and restart Quinapril since she still has her medications on hand. Will route to Dr. Rockey Situ for further instructions regarding medications, pt verbalized understanding. Advised to monitor BP, pt agrees.   Will update med list once Dr. Rockey Situ has reviewed medications.   Pt had her CCTA today, preliminary results reviewed by nurse to Jasmine Webster, pt thankful, advised once Dr. Rockey Situ has read the finally reports, RN will call back to give her the final results and any recommendation if any by Dr. Rockey Situ. Pt verbalized understanding.   Jasmine Webster very thankful for the return call this evening, will stop losartan and restart Quinapril until she has further instructions.

## 2021-07-12 NOTE — Telephone Encounter (Signed)
Pt c/o medication issue:  1. Name of Medication: quinapril to losartan   2. How are you currently taking this medication (dosage and times per day)?  Changed from quinapril to losartan /hctz 50/12.5 mg po q d   3. Are you having a reaction (difficulty breathing--STAT)? Weakness in legs feels terrible   4. What is your medication issue? Feeling bad since med change

## 2021-07-13 NOTE — Progress Notes (Signed)
Pt's lab results mailed to pt, pt is not utilizing his MyChart account to see results   

## 2021-07-15 IMAGING — CT CT HEAD W/O CM
1 series · 15 of 34 positions shown, 19 images · non-contrast
Comparison: Brain MRI 04/18/2017.

CLINICAL DATA: Trip and fall with a blow to the head today. Initial
encounter.

EXAM:
CT HEAD WITHOUT CONTRAST
TECHNIQUE: Contiguous axial images were obtained from the base of the skull
through the vertex without intravenous contrast.

[Series 4: coronal soft tissue · coronal · 0.31mm/px · 15 of 62 slices shown, 19 images]
[im 5/62  brain]
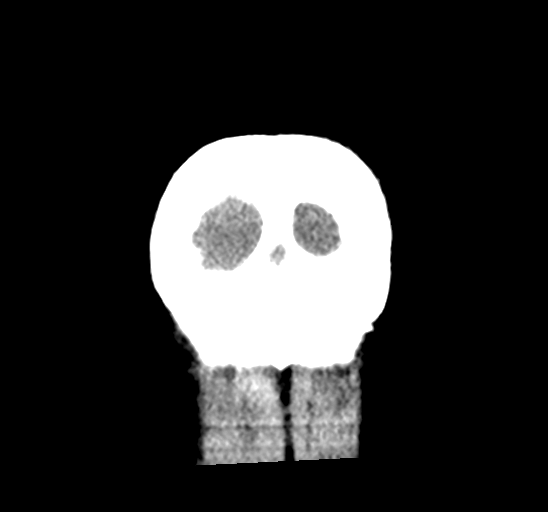
[im 5/62  bone]
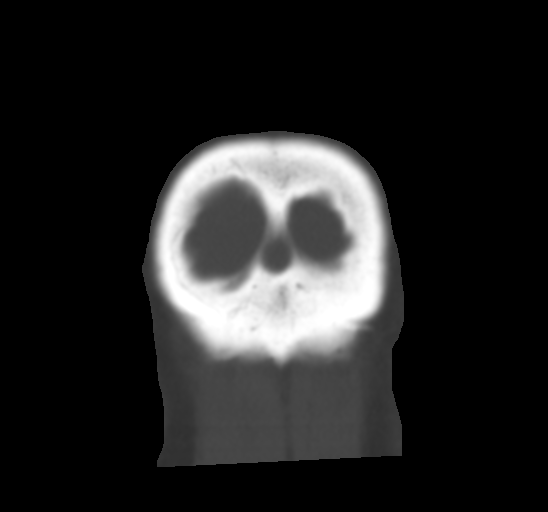
[im 9/62  brain]
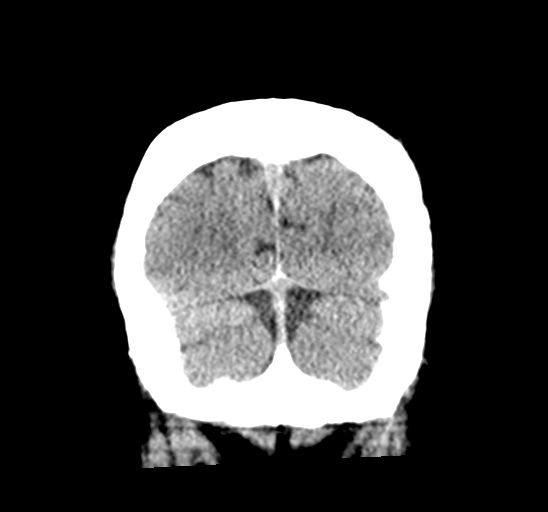
[im 13/62  brain]
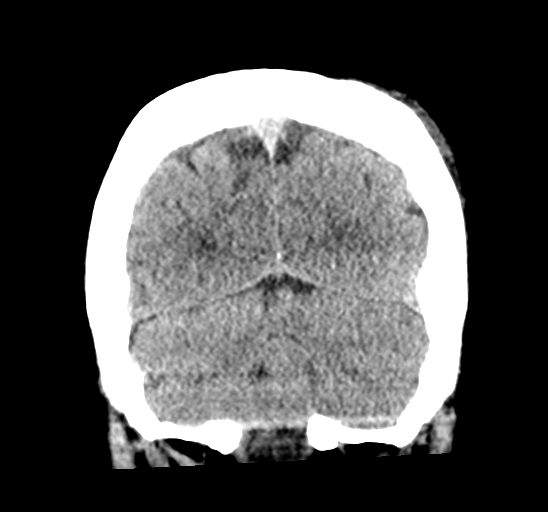
[im 15/62  brain]
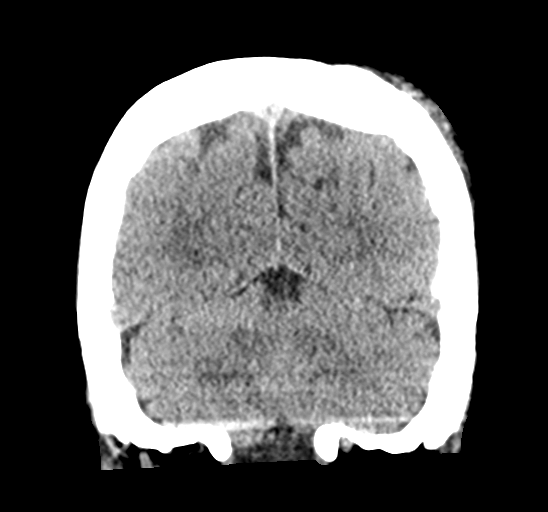
[im 19/62  brain]
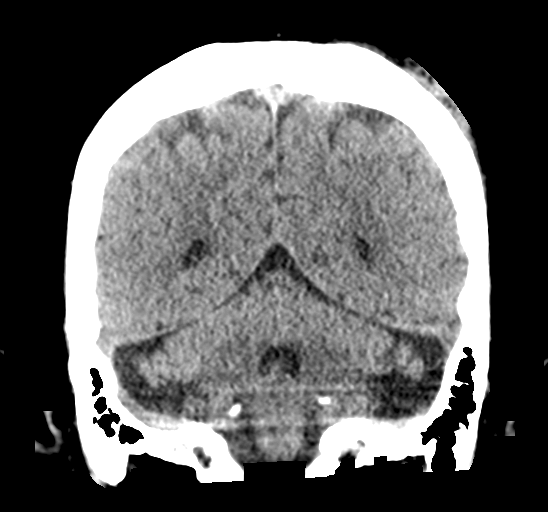
[im 19/62  bone]
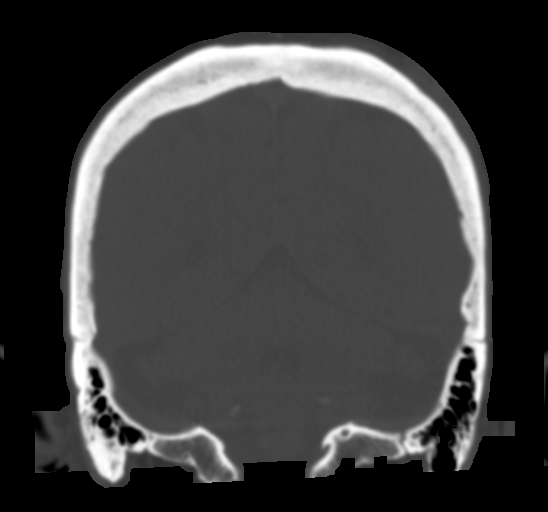
[im 22/62  brain]
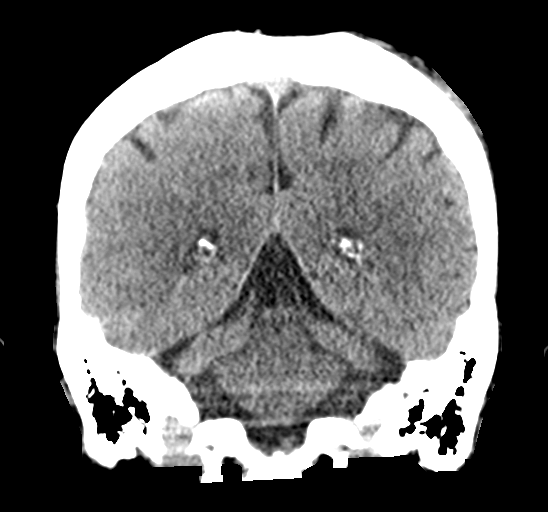
[im 26/62  brain]
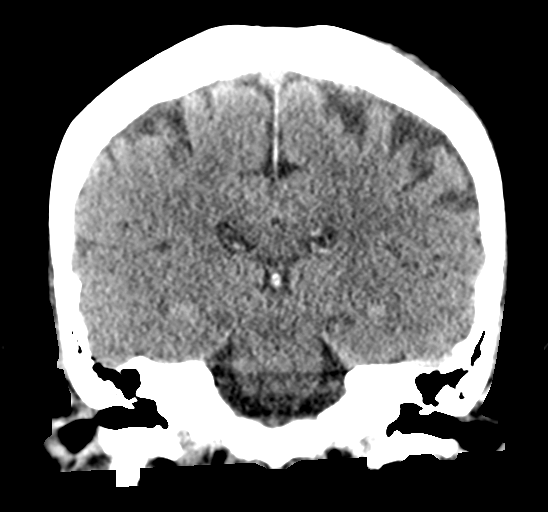
[im 32/62  brain]
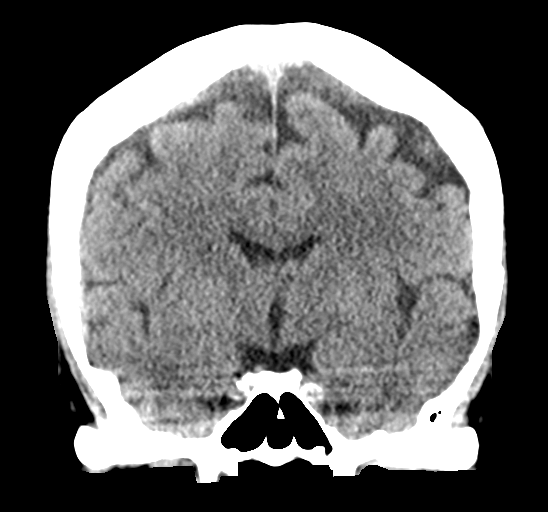
[im 36/62  brain]
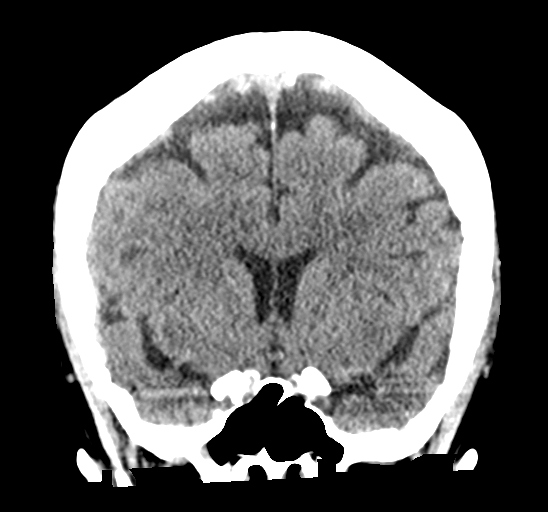
[im 36/62  bone]
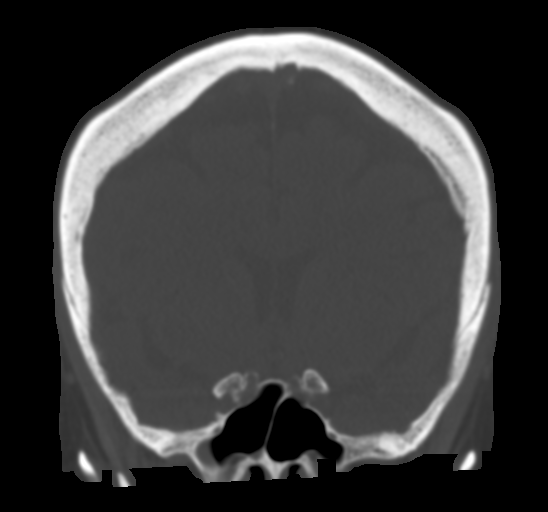
[im 40/62  brain]
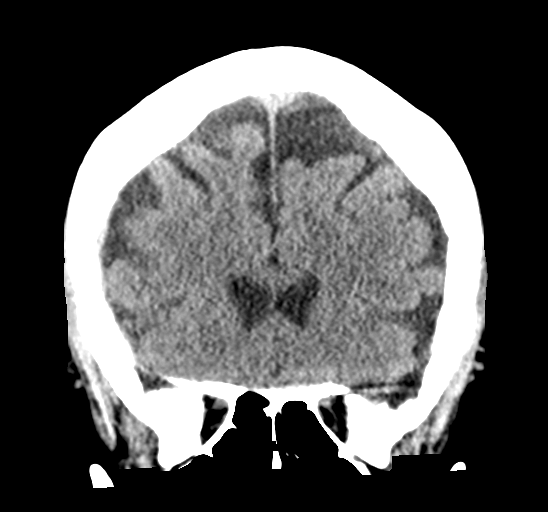
[im 43/62  brain]
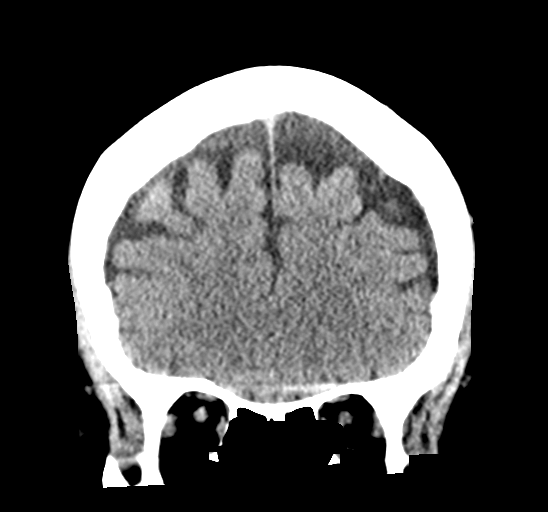
[im 47/62  brain]
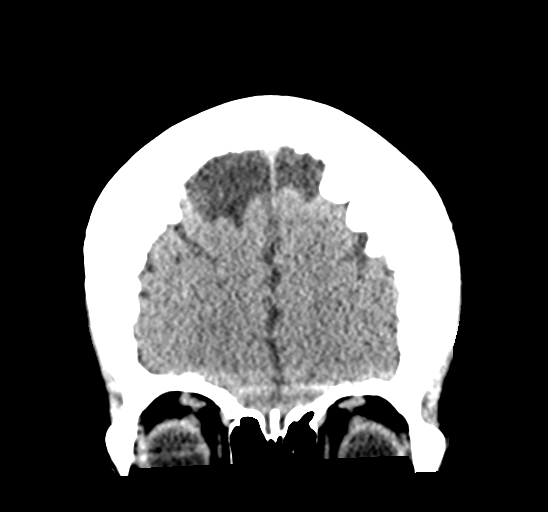
[im 49/62  brain]
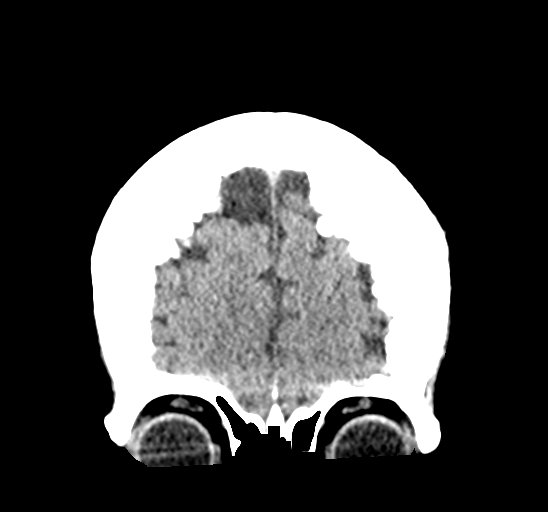
[im 49/62  bone]
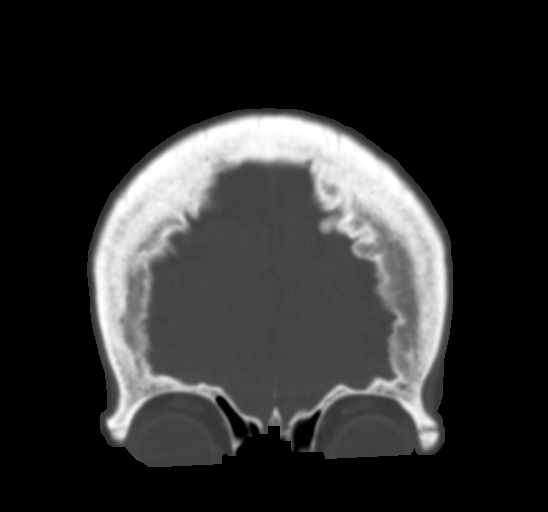
[im 53/62  brain]
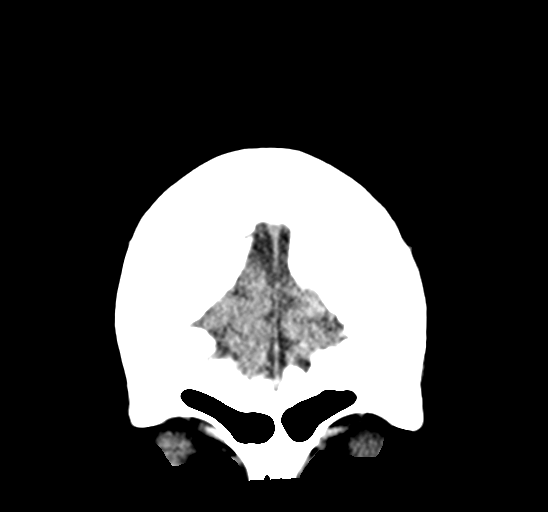
[im 57/62  brain]
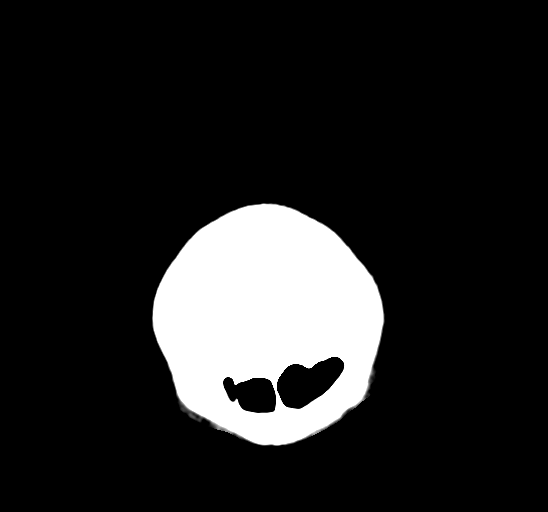

[15 of 34 positions shown; findings below may reference images not displayed]

FINDINGS: Brain: No evidence of acute infarction, hemorrhage, hydrocephalus,
extra-axial collection or mass lesion/mass effect.

Vascular: No hyperdense vessel or unexpected calcification.

Skull: Intact.  No focal lesion.

Sinuses/Orbits: Status post cataract surgery.  Otherwise negative.

Other: Scalp hematoma posteriorly on the left near the vertex noted.
IMPRESSION: Scalp hematoma posteriorly on the left without underlying fracture.

No acute intracranial abnormality.

## 2021-07-16 MED ORDER — QUINAPRIL-HYDROCHLOROTHIAZIDE 20-12.5 MG PO TABS: 1.0000 | ORAL_TABLET | Freq: Two times a day (BID) | ORAL | 3 refills | Status: DC

## 2021-07-16 NOTE — Addendum Note (Signed)
Addended by: Wynema Birch on: 07/16/2021 10:28 AM   Modules accepted: Orders

## 2021-07-16 NOTE — Telephone Encounter (Signed)
Was able to speak with pt's daughter [name] [facility] approved), following up on Mrs. Godette conversation regarding Losartan and Quinapril from last week (see phone encoutner) Dr. Rockey Situ stated  Okay to go back on quinapril  Thx  TG   [name] will make sure Mrs. Toso understands to continue Quinapril and d/c Losartan, med list will be updated and pt will have refills on Quinapril.  [name] also requested when pt has her ECHO that results be called to her and she will have Mrs. Busbee on speaker phone to hear results, that way no "confusion or get my mom over whelmed with the results" gave understanding to [name] and placed not in chart to do so.  [name] thankful for the f/u call, will await ECHO results once pt has her ECHO.

## 2021-07-17 DIAGNOSIS — D485 Neoplasm of uncertain behavior of skin: Secondary | ICD-10-CM | POA: Diagnosis not present

## 2021-07-17 DIAGNOSIS — D0439 Carcinoma in situ of skin of other parts of face: Secondary | ICD-10-CM | POA: Diagnosis not present

## 2021-07-17 DIAGNOSIS — L821 Other seborrheic keratosis: Secondary | ICD-10-CM | POA: Diagnosis not present

## 2021-07-25 ENCOUNTER — Ambulatory Visit (INDEPENDENT_AMBULATORY_CARE_PROVIDER_SITE_OTHER): Payer: PPO

## 2021-07-25 ENCOUNTER — Other Ambulatory Visit: Payer: Self-pay

## 2021-07-25 ENCOUNTER — Other Ambulatory Visit: Payer: Self-pay | Admitting: Family Medicine

## 2021-07-25 DIAGNOSIS — I272 Pulmonary hypertension, unspecified: Secondary | ICD-10-CM

## 2021-07-25 DIAGNOSIS — I350 Nonrheumatic aortic (valve) stenosis: Secondary | ICD-10-CM | POA: Diagnosis not present

## 2021-07-25 LAB — ECHOCARDIOGRAM COMPLETE
AR max vel: 0.83 cm2
AV Area VTI: 0.78 cm2
AV Area mean vel: 0.82 cm2
AV Mean grad: 21.7 mmHg
AV Peak grad: 33.9 mmHg
Ao pk vel: 2.91 m/s
Area-P 1/2: 3.12 cm2
MV VTI: 1.74 cm2
P 1/2 time: 512 msec
S' Lateral: 3.1 cm

## 2021-07-31 ENCOUNTER — Telehealth: Payer: Self-pay

## 2021-07-31 NOTE — Telephone Encounter (Signed)
Able to reach pt's daughter Jasmine Webster regarding pt's recent CCTA Dr. Rockey Situ had a chance to review her results and advised   "Cardiac CTA  Low calcium score  Nonobstructive coronary disease  Overall looks good "  Jasmine Webster is very thankful for the phone call of Jasmine Webster results, all questions and concerns were address with nothing further at this time. Will see at next schedule f/u appt.

## 2021-07-31 NOTE — Telephone Encounter (Signed)
Able to reach pt's daughter [name] regarding mrs. Furnas recent ECHO. Dr. Rockey Situ had a chance to review her results and advised   "Echocardiogram Normal cardiac function, moderate, by some measurements moderate to severe aortic valve stenosis Continue to watch with echo on annual basis"  [name] is  very thankful for the phone call of her results, all questions and concerns were address with nothing further at this time. Will call her mother Mrs. Tursi to let her know of the results. Will see at next schedule f/u appt.

## 2021-08-03 ENCOUNTER — Other Ambulatory Visit: Payer: Self-pay | Admitting: Family Medicine

## 2021-08-03 ENCOUNTER — Other Ambulatory Visit: Payer: Self-pay | Admitting: Cardiovascular Disease

## 2021-08-03 DIAGNOSIS — I209 Angina pectoris, unspecified: Secondary | ICD-10-CM

## 2021-08-04 NOTE — Telephone Encounter (Signed)
Prescriber not at this practice.

## 2021-08-10 ENCOUNTER — Ambulatory Visit: Payer: PPO | Admitting: Cardiovascular Disease

## 2021-08-11 ENCOUNTER — Ambulatory Visit (INDEPENDENT_AMBULATORY_CARE_PROVIDER_SITE_OTHER): Payer: PPO | Admitting: Family Medicine

## 2021-08-11 DIAGNOSIS — Z0000 Encounter for general adult medical examination without abnormal findings: Secondary | ICD-10-CM | POA: Diagnosis not present

## 2021-08-11 NOTE — Progress Notes (Signed)
Subjective:   Jasmine Webster is a 87 y.o. female who presents for Medicare Annual (Subsequent) preventive examination.  Review of Systems    Defer to PCP  Cardiac Risk Factors include: advanced age (>45men, >80 women);diabetes mellitus     Objective:    There were no vitals filed for this visit. There is no height or weight on file to calculate BMI.  Advanced Directives 08/11/2021 07/13/2020 07/12/2020 04/08/2020 03/28/2020 07/06/2018 12/03/2017  Does Patient Have a Medical Advance Directive? Yes Yes Yes No No Yes Yes  Type of Paramedic of [place];Living will Living will Living will - - Living will [place];Living will  Does patient want to make changes to medical advance directive? No - Patient declined - - - - - -  Copy of [place] in Chart? No - copy requested - - - - - No - copy requested    Current Medications (verified) Outpatient Encounter Medications as of 08/11/2021  Medication Sig   ALPRAZolam (XANAX) 0.5 MG tablet TAKE 1 TABLET BY MOUTH EVERY DAY AT BEDTIME AS NEEDED   Artificial Tear Ointment (DRY EYES OP) Apply 1 drop to eye daily.   aspirin EC 81 MG tablet Take 81 mg by mouth daily.   B-D ULTRAFINE III SHORT PEN 31G X 8 MM MISC CHECK SUGAR 2 TIMES DAILY, DX E11.9 (NEEDS ULTRA FINE PEN NEEDLES)   furosemide (LASIX) 20 MG tablet TAKE 1 TABLET (20 MG TOTAL) BY MOUTH AS DIRECTED. TAKE 1 TABLET (20 MG) 2-3 TIMES A WEEK   glucose blood (ONE TOUCH ULTRA TEST) test strip Check sugar twice daily.   HYDROcodone-acetaminophen (NORCO/VICODIN) 5-325 MG tablet Take 1 tablet by mouth every 6 (six) hours as needed for moderate pain.   insulin glargine (LANTUS SOLOSTAR) 100 UNIT/ML Solostar Pen INJECT 25 UNITS SUBCUTANEOUSLY   Insulin Pen Needle 31G X 8 MM MISC Check sugar 2 times daily, DX E11.9 (needs ultra fine pen needles)   isosorbide mononitrate (IMDUR) 30 MG 24 hr tablet Take 1 tablet (30 mg total) by mouth daily.    Lancets (ONETOUCH ULTRASOFT) lancets Check sugar twice daily DX E11.9   Magnesium 400 MG CAPS Take 400 mg by mouth daily.   Multiple Vitamins-Minerals (PRESERVISION AREDS) CAPS Take 1 capsule by mouth 2 (two) times daily.    nitroGLYCERIN (NITROSTAT) 0.4 MG SL tablet Place 1 tablet (0.4 mg total) under the tongue every 5 (five) minutes as needed for chest pain.   Omega-3 Fatty Acids (FISH OIL PO) Take by mouth daily.   omeprazole (PRILOSEC) 20 MG capsule TAKE 1 CAPSULE BY MOUTH EVERY DAY   ondansetron (ZOFRAN) 4 MG tablet TAKE 1 TABLET BY MOUTH EVERY 8 HOURS AS NEEDED FOR NAUSEA AND VOMITING   ONETOUCH ULTRA test strip CHECK SUGAR TWICE A DAY   potassium chloride (KLOR-CON) 10 MEQ tablet TAKE 1 TABLET BY MOUTH 2-3 TIMES A WEEK WITH FUROSEMIDE AS DIRECTED   quinapril-hydrochlorothiazide (ACCURETIC) 20-12.5 MG tablet Take 1 tablet by mouth 2 (two) times daily.   rosuvastatin (CRESTOR) 5 MG tablet Take 1 tablet (5 mg total) by mouth daily.   metoprolol tartrate (LOPRESSOR) 100 MG tablet Take 1 tablet (100 mg total) by mouth once for 1 dose. Take 2 hours prior to your CTA procedure   No facility-administered encounter medications on file as of 08/11/2021.    Allergies (verified) Etodolac, Naproxen, Statins, Latex, Terbinafine, and Terbinafine hcl   History: Past Medical History:  Diagnosis Date  Arthritis    Cataract    cataract removal bilaterally approx 20 years ago   Chronic back pain    Coronary artery disease    COVID-19 virus infection 12/2020   Diabetes mellitus without complication (HCC)    Heart murmur    Hyperlipidemia    Hypertension    Past Surgical History:  Procedure Laterality Date   ABDOMINAL HYSTERECTOMY     APPENDECTOMY     BACK SURGERY     three   CARDIAC CATHETERIZATION     6+yrs no stents Dr. Verneda Skill   CATARACT EXTRACTION     THORACOLUMBAR SYRINGO SHUNT     TONSILLECTOMY AND ADENOIDECTOMY     Family History  Problem Relation Age of Onset    Heart disease Mother        died from MI   Heart attack Mother    Heart disease Sister    Sudden death Brother        shot and beaten to death during home invasion   Stroke Sister    Sudden death Sister        MVA   Cancer Brother        died from lung cancer   Sudden death Sister        19 days old   Cancer Brother        died from lung cancer   Stroke Brother        cause of death   Diabetes Father    Stroke Father    Pneumonia Father    Social History   Socioeconomic History   Marital status: Widowed    Spouse name: Not on file   Number of children: 2   Years of education: Not on file   Highest education level: 8th grade  Occupational History   Occupation: retired  Tobacco Use   Smoking status: Never   Smokeless tobacco: Never  Vaping Use   Vaping Use: Never used  Substance and Sexual Activity   Alcohol use: No    Alcohol/week: 0.0 standard drinks   Drug use: No   Sexual activity: Never  Other Topics Concern   Not on file  Social History Narrative   Not on file   Social Determinants of Health   Financial Resource Strain: Not on file  Food Insecurity: Not on file  Transportation Needs: Not on file  Physical Activity: Not on file  Stress: Not on file  Social Connections: Not on file    Tobacco Counseling Counseling given: Not Answered   Clinical Intake:     Pain : No/denies pain     Diabetes: Yes CBG done?: No CBG resulted in Enter/ Edit results?: No Did pt. bring in CBG monitor from home?: No  How often do you need to have someone help you when you read instructions, pamphlets, or other written materials from your doctor or pharmacy?: 1 - Never  Diabetic?Yes  Interpreter Needed?: No      Activities of Daily Living In your present state of health, do you have any difficulty performing the following activities: 08/11/2021  Hearing? N  Vision? N  Difficulty concentrating or making decisions? N  Walking or climbing stairs? Y  Comment  uses stair chair  Dressing or bathing? N  Doing errands, shopping? Y  Preparing Food and eating ? N  Using the Toilet? N  In the past six months, have you accidently leaked urine? N  Do you have problems with loss of bowel control?  N  Managing your Medications? N  Managing your Finances? N  Housekeeping or managing your Housekeeping? N  Some recent data might be hidden    Patient Care Team: Jerrol Banana., MD as PCP - General (Family Medicine) Birder Robson, MD as Referring Physician (Ophthalmology) Gardiner Barefoot, DPM as Consulting Physician (Podiatry) Rockey Situ, Kathlene November, MD as Consulting Physician (Cardiology)  Indicate any recent Medical Services you may have received from other than [facility] providers in the past year (date may be approximate).     Assessment:   This is a routine wellness examination for [place].  Hearing/Vision screen Hearing Screening - Comments:: Passed whisper test  Vision Screening - Comments:: Vision checked yearly   Dietary issues and exercise activities discussed: Current Exercise Habits: Home exercise routine, Type of exercise: walking, Time (Minutes): 40, Frequency (Times/Week): 7, Weekly Exercise (Minutes/Week): 280, Intensity: Mild   Goals Addressed   None   Depression Screen PHQ 2/9 Scores 08/11/2021 07/12/2020 07/06/2018 12/03/2017 11/03/2017 10/06/2017 08/27/2017  PHQ - 2 Score 0 0 0 0 0 0 0  Exception Documentation - - - - - - -    Fall Risk Fall Risk  08/11/2021 07/12/2020 10/13/2019 07/06/2018 12/03/2017  Falls in the past year? 0 1 0 No No  Comment - - Emmi Telephone Survey: data to providers prior to load - -  Number falls in past yr: 0 0 - - -  Injury with Fall? 0 0 - - -  Risk for fall due to : Impaired balance/gait;Orthopedic patient;Impaired mobility - - - -  Follow up Falls prevention discussed Falls prevention discussed - - -    FALL RISK PREVENTION PERTAINING TO THE HOME:  Any stairs in or around the home? Yes  If so,  are there any without handrails? Yes  Home free of loose throw rugs in walkways, pet beds, electrical cords, etc? Yes  Adequate lighting in your home to reduce risk of falls? Yes   ASSISTIVE DEVICES UTILIZED TO PREVENT FALLS:  Life alert? No  Use of a cane, walker or w/c? Yes  Grab bars in the bathroom? Yes  Shower chair or bench in shower? Yes  Elevated toilet seat or a handicapped toilet? No   TIMED UP AND GO:  Was the test performed? No .  Length of time to ambulate 10 feet: 0 sec.   Gait slow and steady with assistive device  Cognitive Function:     6CIT Screen 08/11/2021 09/06/2016  What Year? 0 points 0 points  What month? 0 points 0 points  What time? 0 points 0 points  Count back from 20 0 points 0 points  Months in reverse 0 points 4 points  Repeat phrase 0 points 6 points  Total Score 0 10    Immunizations Immunization History  Administered Date(s) Administered   Influenza, High Dose Seasonal PF 09/04/2015, 08/29/2016, 08/02/2018, 08/12/2019   Influenza-Unspecified 09/18/2013, 08/16/2017   PFIZER(Purple Top)SARS-COV-2 Vaccination 12/02/2019, 12/23/2019, 07/19/2020   Pneumococcal Conjugate-13 12/27/2014   Pneumococcal Polysaccharide-23 07/13/1998   Td 03/29/2010    TDAP status: Due, Education has been provided regarding the importance of this vaccine. Advised may receive this vaccine at local pharmacy or Health Dept. Aware to provide a copy of the vaccination record if obtained from local pharmacy or Health Dept. Verbalized acceptance and understanding.  Flu Vaccine status: Due, Education has been provided regarding the importance of this vaccine. Advised may receive this vaccine at local pharmacy or Health Dept. Aware to provide a copy  of the vaccination record if obtained from local pharmacy or Health Dept. Verbalized acceptance and understanding.  Pneumococcal vaccine status: Due, Education has been provided regarding the importance of this vaccine. Advised  may receive this vaccine at local pharmacy or Health Dept. Aware to provide a copy of the vaccination record if obtained from local pharmacy or Health Dept. Verbalized acceptance and understanding.  Covid-19 vaccine status: Information provided on how to obtain vaccines.   Qualifies for Shingles Vaccine? Yes   Zostavax completed No   Shingrix Completed?: No.    Education has been provided regarding the importance of this vaccine. Patient has been advised to call insurance company to determine out of pocket expense if they have not yet received this vaccine. Advised may also receive vaccine at local pharmacy or Health Dept. Verbalized acceptance and understanding.  Screening Tests Health Maintenance  Topic Date Due   Zoster Vaccines- Shingrix (1 of 2) Never done   OPHTHALMOLOGY EXAM  06/26/2019   TETANUS/TDAP  03/29/2020   COVID-19 Vaccine (4 - Booster for Pfizer series) 10/11/2020   FOOT EXAM  03/16/2021   INFLUENZA VACCINE  06/18/2021   HEMOGLOBIN A1C  11/07/2021   DEXA SCAN  Completed   HPV VACCINES  Aged Out    Health Maintenance  Health Maintenance Due  Topic Date Due   Zoster Vaccines- Shingrix (1 of 2) Never done   OPHTHALMOLOGY EXAM  06/26/2019   TETANUS/TDAP  03/29/2020   COVID-19 Vaccine (4 - Booster for Pfizer series) 10/11/2020   FOOT EXAM  03/16/2021   INFLUENZA VACCINE  06/18/2021    Colorectal cancer screening: No longer required.   Mammogram status: No longer required due to Age..  Bone Density: Defer to PCP.   Lung Cancer Screening: (Low Dose CT Chest recommended if Age 10-80 years, 30 pack-year currently smoking OR have quit w/in 15years.) does not qualify.     Additional Screening:  Hepatitis C Screening: does not qualify; Completed   Vision Screening: Recommended annual ophthalmology exams for early detection of glaucoma and other disorders of the eye. Is the patient up to date with their annual eye exam?  Yes  Who is the provider or what is the  name of the office in which the patient attends annual eye exams? [place]  If pt is not established with a provider, would they like to be referred to a provider to establish care? No .   Dental Screening: Recommended annual dental exams for proper oral hygiene  Community Resource Referral / Chronic Care Management: CRR required this visit?  No   CCM required this visit?  No      Plan:     I have personally reviewed and noted the following in the patient's chart:   Medical and social history Use of alcohol, tobacco or illicit drugs  Current medications and supplements including opioid prescriptions.  Functional ability and status Nutritional status Physical activity Advanced directives List of other physicians Hospitalizations, surgeries, and ER visits in previous 12 months Vitals Screenings to include cognitive, depression, and falls Referrals and appointments  In addition, I have reviewed and discussed with patient certain preventive protocols, quality metrics, and best practice recommendations. A written personalized care plan for preventive services as well as general preventive health recommendations were provided to patient.     Stephan Minister, [place]   08/11/2021

## 2021-08-11 NOTE — Patient Instructions (Signed)
Diabetes Mellitus and Foot Care Foot care is an important part of your health, especially when you have diabetes. Diabetes may cause you to have problems because of poor blood flow (circulation) to your feet and legs, which can cause your skin to: Become thinner and drier. Break more easily. Heal more slowly. Peel and crack. You may also have nerve damage (neuropathy) in your legs and feet, causing decreased feeling in them. This means that you may not notice minor injuries to your feet that could lead to more serious problems. Noticing and addressing any potential problems early is the best way to prevent future foot problems. How to care for your feet Foot hygiene  Wash your feet daily with warm water and mild soap. Do not use hot water. Then, pat your feet and the areas between your toes until they are completely dry. Do not soak your feet as this can dry your skin. Trim your toenails straight across. Do not dig under them or around the cuticle. File the edges of your nails with an emery board or nail file. Apply a moisturizing lotion or petroleum jelly to the skin on your feet and to dry, brittle toenails. Use lotion that does not contain alcohol and is unscented. Do not apply lotion between your toes. Shoes and socks Wear clean socks or stockings every day. Make sure they are not too tight. Do not wear knee-high stockings since they may decrease blood flow to your legs. Wear shoes that fit properly and have enough cushioning. Always look in your shoes before you put them on to be sure there are no objects inside. To break in new shoes, wear them for just a few hours a day. This prevents injuries on your feet. Wounds, scrapes, corns, and calluses  Check your feet daily for blisters, cuts, bruises, sores, and redness. If you cannot see the bottom of your feet, use a mirror or ask someone for help. Do not cut corns or calluses or try to remove them with medicine. If you find a minor scrape,  cut, or break in the skin on your feet, keep it and the skin around it clean and dry. You may clean these areas with mild soap and water. Do not clean the area with peroxide, alcohol, or iodine. If you have a wound, scrape, corn, or callus on your foot, look at it several times a day to make sure it is healing and not infected. Check for: Redness, swelling, or pain. Fluid or blood. Warmth. Pus or a bad smell. General tips Do not cross your legs. This may decrease blood flow to your feet. Do not use heating pads or hot water bottles on your feet. They may burn your skin. If you have lost feeling in your feet or legs, you may not know this is happening until it is too late. Protect your feet from hot and cold by wearing shoes, such as at the beach or on hot pavement. Schedule a complete foot exam at least once a year (annually) or more often if you have foot problems. Report any cuts, sores, or bruises to your health care provider immediately. Where to find more information American Diabetes Association: www.diabetes.org Association of Diabetes Care & Education Specialists: www.diabeteseducator.org Contact a health care provider if: You have a medical condition that increases your risk of infection and you have any cuts, sores, or bruises on your feet. You have an injury that is not healing. You have redness on your legs or feet. You   feel burning or tingling in your legs or feet. You have pain or cramps in your legs and feet. Your legs or feet are numb. Your feet always feel cold. You have pain around any toenails. Get help right away if: You have a wound, scrape, corn, or callus on your foot and: You have pain, swelling, or redness that gets worse. You have fluid or blood coming from the wound, scrape, corn, or callus. Your wound, scrape, corn, or callus feels warm to the touch. You have pus or a bad smell coming from the wound, scrape, corn, or callus. You have a fever. You have a red  line going up your leg. Summary Check your feet every day for blisters, cuts, bruises, sores, and redness. Apply a moisturizing lotion or petroleum jelly to the skin on your feet and to dry, brittle toenails. Wear shoes that fit properly and have enough cushioning. If you have foot problems, report any cuts, sores, or bruises to your health care provider immediately. Schedule a complete foot exam at least once a year (annually) or more often if you have foot problems. This information is not intended to replace advice given to you by your health care provider. Make sure you discuss any questions you have with your health care provider. Document Revised: 05/25/2020 Document Reviewed: 05/25/2020 Elsevier Patient Education  2022 [place] for Type 2 Diabetes A screening test for type 2 diabetes (type 2 diabetes mellitus) is a blood test to measure your blood sugar (glucose) level. This test is done to check for early signs of diabetes, before you develop symptoms.  Type 2 diabetes is a long-term (chronic) disease. In type 2 diabetes, one or both of these problems may be present: The pancreas does not make enough of a hormone called insulin. Cells in the body do not respond properly to insulin that the body makes (insulin resistance). Normally, insulin allows blood sugar (glucose) to enter cells in the body. The cells use glucose for energy. Insulin resistance or lack of insulin causes excess glucose to build up in the blood instead of going into cells. This results in high blood glucose levels (hyperglycemia), which can cause many complications. You may be screened for type 2 diabetes as part of your regular health care, especially if you have a high risk for diabetes. Screening can help to identify type 2 diabetes at its early stage (prediabetes). Identifying and treating prediabetes may delay or prevent the development of type 2 diabetes. Tell a health care provider about: All  medicines you are taking, including vitamins, herbs, eye drops, creams, and over-the-counter medicines. Any bleeding problems you have. Any medical conditions you have. Whether you are pregnant or may be pregnant. Who should be screened for type 2 diabetes? Adults Adults age 37 and older. These adults should be screened once every three years. Adults who are any age, are overweight, and have one other risk factor. These adults should be screened once every three years. Adults who have normal blood glucose levels and two or more risk factors. These adults may be screened once every year (annually). Women who have had gestational diabetes in the past. These women should be screened once every three years. Pregnant women who have risk factors. These women should be screened at their first prenatal visit and again between weeks 24 and 28 of pregnancy. Children and adolescents Children and adolescents should be screened for type 2 diabetes if they are overweight and have any of the following risk  factors: A family history of type 2 diabetes. Being a member of a high-risk ethnic group. Signs of insulin resistance or conditions that are associated with insulin resistance. A mother who had gestational diabetes while pregnant. Screening should be done at least once every three years, starting at age 62 or at the onset of puberty, whichever comes first. Your health care provider or your child's health care provider may recommend having a screening more or less often. What are the risk factors for type 2 diabetes? The following are factors that may make you more likely to develop type 2 diabetes and can be modified: Not getting enough exercise. Having high blood pressure. Having low levels of good cholesterol (HDL-C) or high levels of blood fats (triglycerides). Having high blood glucose in a previous blood test. Being overweight or obese. The following are factors that may make you more likely to  develop type 2 diabetes and can not be modified: Having a parent or sibling (first-degree relative) who has diabetes. Being of American-Indian, African-American, Hispanic/Latino, Asian, or [place] descent. Being older than age 30. Having a history of diabetes during pregnancy (gestational diabetes). Having certain diseases or conditions that may be caused by insulin resistance, including: Acanthosis nigricans. This is a condition that causes dark skin on the neck, armpits, and groin. Polycystic ovary syndrome (PCOS). Cardiovascular heart disease. What happens during screening? During screening, your health care provider may ask questions about: Your health and your risk factors, including your activity level and any medical conditions that you have. The health of your first-degree relatives. Past pregnancies, if this applies. Your health care provider will also do a physical exam, including a blood pressure measurement and blood tests. There are four blood tests that can be used to screen for type 2 diabetes. You may have one or more of the following: A fasting blood glucose (FBG) test. You will not be allowed to eat (you will fast) for 8 hours or more before a blood sample is taken. A random blood glucose test. This test checks your blood glucose at any time of the day regardless of when you ate. An oral glucose tolerance test (OGTT). This test measures your blood glucose at two times: After you have not eaten (have fasted) overnight. This is your baseline glucose level. Two hours after you drink a glucose-containing beverage. An A1C (hemoglobin A1C) blood test. This test provides information about blood glucose control over the previous 2-3 months. What do the results mean? Your test results are a measurement of how much glucose is in your blood. Normal blood glucose levels mean that you do not have diabetes or prediabetes. High blood glucose levels may mean that you have prediabetes  or diabetes. Depending on the results, other tests may be needed to confirm the diagnosis. You may be diagnosed with type 2 diabetes if: Your FBG level is 126 mg/dL (7.0 mmol/L) or higher. Your random blood glucose level is 200 mg/dL (11.1 mmol/L) or higher. Your A1C level is 6.5% or higher. Your OGTT result is higher than 200 mg/dL (11.1 mmol/L). These blood tests may be repeated to confirm your diagnosis. Talk with your health care provider about what your results mean. Summary A screening test for type 2 diabetes (type 2 diabetes mellitus) is a blood test to measure your blood sugar (glucose) level. Know what your risk factors are for developing type 2 diabetes. If you are at risk, get screening tests as often as told by your health care provider. Screening  may help you identify type 2 diabetes at its early stage (prediabetes). Identifying and treating prediabetes may delay or prevent the development of type 2 diabetes. This information is not intended to replace advice given to you by your health care provider. Make sure you discuss any questions you have with your health care provider. Document Revised: 01/29/2021 Document Reviewed: 01/29/2021 Elsevier Patient Education  2022 [place] Prevention in the Home, Adult Falls can cause injuries and can happen to people of all ages. There are many things you can do to make your home safe and to help prevent falls. Ask for help when making these changes. What actions can I take to prevent falls? General Instructions Use good lighting in all rooms. Replace any light bulbs that burn out. Turn on the lights in dark areas. Use night-lights. Keep items that you use often in easy-to-reach places. Lower the shelves around your home if needed. Set up your furniture so you have a clear path. Avoid moving your furniture around. Do not have throw rugs or other things on the floor that can make you trip. Avoid walking on wet floors. If any of  your floors are uneven, fix them. Add color or contrast paint or tape to clearly mark and help you see: Grab bars or handrails. First and last steps of staircases. Where the edge of each step is. If you use a stepladder: Make sure that it is fully opened. Do not climb a closed stepladder. Make sure the sides of the stepladder are locked in place. Ask someone to hold the stepladder while you use it. Know where your pets are when moving through your home. What can I do in the bathroom?   Keep the floor dry. Clean up any water on the floor right away. Remove soap buildup in the tub or shower. Use nonskid mats or decals on the floor of the tub or shower. Attach bath mats securely with double-sided, nonslip rug tape. If you need to sit down in the shower, use a plastic, nonslip stool. Install grab bars by the toilet and in the tub and shower. Do not use towel bars as grab bars. What can I do in the bedroom? Make sure that you have a light by your bed that is easy to reach. Do not use any sheets or blankets for your bed that hang to the floor. Have a firm chair with side arms that you can use for support when you get dressed. What can I do in the kitchen? Clean up any spills right away. If you need to reach something above you, use a step stool with a grab bar. Keep electrical cords out of the way. Do not use floor polish or wax that makes floors slippery. What can I do with my stairs? Do not leave any items on the stairs. Make sure that you have a light switch at the top and the bottom of the stairs. Make sure that there are handrails on both sides of the stairs. Fix handrails that are broken or loose. Install nonslip stair treads on all your stairs. Avoid having throw rugs at the top or bottom of the stairs. Choose a carpet that does not hide the edge of the steps on the stairs. Check carpeting to make sure that it is firmly attached to the stairs. Fix carpet that is loose or  worn. What can I do on the outside of my home? Use bright outdoor lighting. Fix the edges of walkways and  driveways and fix any cracks. Remove anything that might make you trip as you walk through a door, such as a raised step or threshold. Trim any bushes or trees on paths to your home. Check to see if handrails are loose or broken and that both sides of all steps have handrails. Install guardrails along the edges of any raised decks and porches. Clear paths of anything that can make you trip, such as tools or rocks. Have leaves, snow, or ice cleared regularly. Use sand or salt on paths during winter. Clean up any spills in your garage right away. This includes grease or oil spills. What other actions can I take? Wear shoes that: Have a low heel. Do not wear high heels. Have rubber bottoms. Feel good on your feet and fit well. Are closed at the toe. Do not wear open-toe sandals. Use tools that help you move around if needed. These include: Canes. Walkers. Scooters. Crutches. Review your medicines with your doctor. Some medicines can make you feel dizzy. This can increase your chance of falling. Ask your doctor what else you can do to help prevent falls. Where to find more information Centers for Disease Control and Prevention, [employer]: http://www.wolf.info/ National Institute on Aging: http://kim-miller.com/ Contact a doctor if: You are afraid of falling at home. You feel weak, drowsy, or dizzy at home. You fall at home. Summary There are many simple things that you can do to make your home safe and to help prevent falls. Ways to make your home safe include removing things that can make you trip and installing grab bars in the bathroom. Ask for help when making these changes in your home. This information is not intended to replace advice given to you by your health care provider. Make sure you discuss any questions you have with your health care provider. Document Revised: 06/07/2020 Document  Reviewed: 06/07/2020 Elsevier Patient Education  [place] Maintenance, Female Adopting a healthy lifestyle and getting preventive care are important in promoting health and wellness. Ask your health care provider about: The right schedule for you to have regular tests and exams. Things you can do on your own to prevent diseases and keep yourself healthy. What should I know about diet, weight, and exercise? Eat a healthy diet  Eat a diet that includes plenty of vegetables, fruits, low-fat dairy products, and lean protein. Do not eat a lot of foods that are high in solid fats, added sugars, or sodium. Maintain a healthy weight Body mass index (BMI) is used to identify weight problems. It estimates body fat based on height and weight. Your health care provider can help determine your BMI and help you achieve or maintain a healthy weight. Get regular exercise Get regular exercise. This is one of the most important things you can do for your health. Most adults should: Exercise for at least 150 minutes each week. The exercise should increase your heart rate and make you sweat (moderate-intensity exercise). Do strengthening exercises at least twice a week. This is in addition to the moderate-intensity exercise. Spend less time sitting. Even light physical activity can be beneficial. Watch cholesterol and blood lipids Have your blood tested for lipids and cholesterol at 85 years of age, then have this test every 5 years. Have your cholesterol levels checked more often if: Your lipid or cholesterol levels are high. You are older than 85 years of age. You are at high risk for heart disease. What should I know about cancer screening?  Depending on your health history and family history, you may need to have cancer screening at various ages. This may include screening for: Breast cancer. Cervical cancer. Colorectal cancer. Skin cancer. Lung cancer. What should I know about  heart disease, diabetes, and high blood pressure? Blood pressure and heart disease High blood pressure causes heart disease and increases the risk of stroke. This is more likely to develop in people who have high blood pressure readings, are of African descent, or are overweight. Have your blood pressure checked: Every 3-5 years if you are 10-57 years of age. Every year if you are 54 years old or older. Diabetes Have regular diabetes screenings. This checks your fasting blood sugar level. Have the screening done: Once every three years after age 37 if you are at a normal weight and have a low risk for diabetes. More often and at a younger age if you are overweight or have a high risk for diabetes. What should I know about preventing infection? Hepatitis B If you have a higher risk for hepatitis B, you should be screened for this virus. Talk with your health care provider to find out if you are at risk for hepatitis B infection. Hepatitis C Testing is recommended for: Everyone born from [birth year] through 1963. Anyone with known risk factors for hepatitis C. Sexually transmitted infections (STIs) Get screened for STIs, including gonorrhea and chlamydia, if: You are sexually active and are younger than 85 years of age. You are older than 85 years of age and your health care provider tells you that you are at risk for this type of infection. Your sexual activity has changed since you were last screened, and you are at increased risk for chlamydia or gonorrhea. Ask your health care provider if you are at risk. Ask your health care provider about whether you are at high risk for HIV. Your health care provider may recommend a prescription medicine to help prevent HIV infection. If you choose to take medicine to prevent HIV, you should first get tested for HIV. You should then be tested every 3 months for as long as you are taking the medicine. Pregnancy If you are about to stop having your period  (premenopausal) and you may become pregnant, seek counseling before you get pregnant. Take 400 to 800 micrograms (mcg) of folic acid every day if you become pregnant. Ask for birth control (contraception) if you want to prevent pregnancy. Osteoporosis and menopause Osteoporosis is a disease in which the bones lose minerals and strength with aging. This can result in bone fractures. If you are 91 years old or older, or if you are at risk for osteoporosis and fractures, ask your health care provider if you should: Be screened for bone loss. Take a calcium or vitamin D supplement to lower your risk of fractures. Be given hormone replacement therapy (HRT) to treat symptoms of menopause. Follow these instructions at home: Lifestyle Do not use any products that contain nicotine or tobacco, such as cigarettes, e-cigarettes, and chewing tobacco. If you need help quitting, ask your health care provider. Do not use street drugs. Do not share needles. Ask your health care provider for help if you need support or information about quitting drugs. Alcohol use Do not drink alcohol if: Your health care provider tells you not to drink. You are pregnant, may be pregnant, or are planning to become pregnant. If you drink alcohol: Limit how much you use to 0-1 drink a day. Limit intake if you are  breastfeeding. Be aware of how much alcohol is in your drink. In the [place] one drink equals one 12 oz bottle of beer (355 mL), one 5 oz glass of wine (148 mL), or one 1 oz glass of hard liquor (44 mL). General instructions Schedule regular health, dental, and eye exams. Stay current with your vaccines. Tell your health care provider if: You often feel depressed. You have ever been abused or do not feel safe at home. Summary Adopting a healthy lifestyle and getting preventive care are important in promoting health and wellness. Follow your health care provider's instructions about healthy diet, exercising, and  getting tested or screened for diseases. Follow your health care provider's instructions on monitoring your cholesterol and blood pressure. This information is not intended to replace advice given to you by your health care provider. Make sure you discuss any questions you have with your health care provider. Document Revised: 01/12/2021 Document Reviewed: 10/28/2018 Elsevier Patient Education  2022 [employer].

## 2021-08-17 ENCOUNTER — Other Ambulatory Visit: Payer: Self-pay | Admitting: Family Medicine

## 2021-08-17 DIAGNOSIS — F419 Anxiety disorder, unspecified: Secondary | ICD-10-CM

## 2021-08-17 NOTE — Telephone Encounter (Signed)
Requested medications are due for refill today yes  Requested medications are on the active medication list yes  Last refill 07/19/21  Last visit 05/08/21  Future visit scheduled 09/10/21  Notes to clinic This med is Not Delegated, please assess.

## 2021-08-21 DIAGNOSIS — T8189XA Other complications of procedures, not elsewhere classified, initial encounter: Secondary | ICD-10-CM | POA: Diagnosis not present

## 2021-08-21 DIAGNOSIS — L923 Foreign body granuloma of the skin and subcutaneous tissue: Secondary | ICD-10-CM | POA: Diagnosis not present

## 2021-08-23 NOTE — Telephone Encounter (Signed)
Patient called in wnating to know why she cant get refill of alprozalam. Please call back

## 2021-08-28 ENCOUNTER — Ambulatory Visit (INDEPENDENT_AMBULATORY_CARE_PROVIDER_SITE_OTHER): Payer: PPO

## 2021-08-28 ENCOUNTER — Other Ambulatory Visit: Payer: Self-pay

## 2021-08-28 ENCOUNTER — Ambulatory Visit: Payer: PPO | Admitting: Podiatry

## 2021-08-28 DIAGNOSIS — S9031XA Contusion of right foot, initial encounter: Secondary | ICD-10-CM | POA: Diagnosis not present

## 2021-08-28 NOTE — Progress Notes (Signed)
   HPI: 87 y.o. female presenting today for evaluation of an injury that the patient sustained about 3 weeks ago to the right foot.  Patient states that she dropped a water bottle on her right foot.  She has had some pain and tenderness which has resolved.  She says she is able to walk throughout the day just fine however she does have some evening pain at night when she goes to bed.  Overall there has been improvement of the last 3 weeks  Past Medical History:  Diagnosis Date   Arthritis    Cataract    cataract removal bilaterally approx 20 years ago   Chronic back pain    Coronary artery disease    COVID-19 virus infection 12/2020   Diabetes mellitus without complication (HCC)    Heart murmur    Hyperlipidemia    Hypertension      Physical Exam: General: The patient is alert and oriented x3 in no acute distress.  Dermatology: Skin is warm, dry and supple bilateral lower extremities. Negative for open lesions or macerations.  Vascular: Ecchymosis with mild edema noted to the dorsal lateral aspect of the right foot  Neurological: Epicritic and protective threshold grossly intact bilaterally.   Musculoskeletal Exam: No pedal deformity noted.  Associated tenderness to light palpation of the soft tissue contusion  Radiographic Exam:  Diffuse degenerative changes with increased radiolucency of the bone consistent with findings of DJD and osteoporosis which is expected given the patient's age.  No acute fractures identified  Assessment: 1.  Soft tissue contusion right foot   Plan of Care:  1. Patient evaluated. X-Rays reviewed.  2.  Patient states that she is able to ambulate just fine throughout the day.  Continue wearing good supportive sneakers and shoes 3.  Patient variances most of her pain at night when she goes to bed.  Recommend OTC Tylenol nightly 4.  Return to clinic as needed      Edrick Kins, DPM [facility]  Dr. Edrick Kins, DPM    [address], [place] [address]                Office [phone]  Fax [phone]

## 2021-09-10 ENCOUNTER — Other Ambulatory Visit: Payer: Self-pay

## 2021-09-10 ENCOUNTER — Ambulatory Visit (INDEPENDENT_AMBULATORY_CARE_PROVIDER_SITE_OTHER): Payer: PPO | Admitting: Family Medicine

## 2021-09-10 VITALS — BP 141/55 | HR 77 | Temp 98.1°F | Wt 158.0 lb

## 2021-09-10 DIAGNOSIS — Z23 Encounter for immunization: Secondary | ICD-10-CM

## 2021-09-10 DIAGNOSIS — E1159 Type 2 diabetes mellitus with other circulatory complications: Secondary | ICD-10-CM | POA: Diagnosis not present

## 2021-09-10 DIAGNOSIS — E7800 Pure hypercholesterolemia, unspecified: Secondary | ICD-10-CM | POA: Diagnosis not present

## 2021-09-10 DIAGNOSIS — I350 Nonrheumatic aortic (valve) stenosis: Secondary | ICD-10-CM

## 2021-09-10 DIAGNOSIS — M5417 Radiculopathy, lumbosacral region: Secondary | ICD-10-CM | POA: Diagnosis not present

## 2021-09-10 DIAGNOSIS — I209 Angina pectoris, unspecified: Secondary | ICD-10-CM | POA: Diagnosis not present

## 2021-09-10 DIAGNOSIS — E039 Hypothyroidism, unspecified: Secondary | ICD-10-CM | POA: Diagnosis not present

## 2021-09-10 DIAGNOSIS — I10 Essential (primary) hypertension: Secondary | ICD-10-CM | POA: Diagnosis not present

## 2021-09-10 DIAGNOSIS — F5101 Primary insomnia: Secondary | ICD-10-CM | POA: Diagnosis not present

## 2021-09-10 DIAGNOSIS — I25118 Atherosclerotic heart disease of native coronary artery with other forms of angina pectoris: Secondary | ICD-10-CM | POA: Diagnosis not present

## 2021-09-10 DIAGNOSIS — I359 Nonrheumatic aortic valve disorder, unspecified: Secondary | ICD-10-CM | POA: Diagnosis not present

## 2021-09-10 LAB — POCT GLYCOSYLATED HEMOGLOBIN (HGB A1C): Hemoglobin A1C: 6.4 % — AB (ref 4.0–5.6)

## 2021-09-10 NOTE — Progress Notes (Signed)
Established patient visit   Patient: Jasmine Webster   DOB: [birth year]   87 y.o. Female  MRN: [id number] Visit Date: 09/10/2021  Today's healthcare provider: [name], MD   No chief complaint on file.  Subjective    HPI  Patient doing well.  She feels better off the Crestor.  She has occasional hypoglycemia but is able to control this.  He has chronic arthritic type pains and low back pain.  She also has some mild muscle pain at times.  He states the Crestor or any statin makes it worse. She is not interested in any referrals or any surgical intervention and I agree with her. Chief states she feels really well and blessed to be 87 years old. Diabetes Mellitus Type II, follow-up  Lab Results  Component Value Date   HGBA1C 6.9 (A) 05/08/2021   HGBA1C 6.9 (A) 01/18/2021   HGBA1C 6.7 (A) 07/27/2020   Last seen for diabetes 4 months ago.  Management since then includes continuing the same treatment. She reports good compliance with treatment. She is having side effects.   Home blood sugar records: fasting range: 89's and 90's  Episodes of hypoglycemia? No    Most Recent Eye Exam: Patient is due  --------------------------------------------------------------------------------------------------- Patient also reports having leg pain.  She states her 'bones' hurt.  She admits it may bwe coming from her back.  She says it is especially painful to bend over.    Medications: Outpatient Medications Prior to Visit  Medication Sig   ALPRAZolam (XANAX) 0.5 MG tablet TAKE 1 TABLET BY MOUTH EVERY DAY AT BEDTIME AS NEEDED   Artificial Tear Ointment (DRY EYES OP) Apply 1 drop to eye daily.   aspirin EC 81 MG tablet Take 81 mg by mouth daily.   B-D ULTRAFINE III SHORT PEN 31G X 8 MM MISC CHECK SUGAR 2 TIMES DAILY, DX E11.9 (NEEDS ULTRA FINE PEN NEEDLES)   furosemide (LASIX) 20 MG tablet TAKE 1 TABLET (20 MG TOTAL) BY MOUTH AS DIRECTED. TAKE 1 TABLET (20 MG) 2-3 TIMES A WEEK    glucose blood (ONE TOUCH ULTRA TEST) test strip Check sugar twice daily.   HYDROcodone-acetaminophen (NORCO/VICODIN) 5-325 MG tablet Take 1 tablet by mouth every 6 (six) hours as needed for moderate pain.   insulin glargine (LANTUS SOLOSTAR) 100 UNIT/ML Solostar Pen INJECT 25 UNITS SUBCUTANEOUSLY   Insulin Pen Needle 31G X 8 MM MISC Check sugar 2 times daily, DX E11.9 (needs ultra fine pen needles)   isosorbide mononitrate (IMDUR) 30 MG 24 hr tablet Take 1 tablet (30 mg total) by mouth daily.   Lancets (ONETOUCH ULTRASOFT) lancets Check sugar twice daily DX E11.9   Magnesium 400 MG CAPS Take 400 mg by mouth daily.   Multiple Vitamins-Minerals (PRESERVISION AREDS) CAPS Take 1 capsule by mouth 2 (two) times daily.    nitroGLYCERIN (NITROSTAT) 0.4 MG SL tablet Place 1 tablet (0.4 mg total) under the tongue every 5 (five) minutes as needed for chest pain.   Omega-3 Fatty Acids (FISH OIL PO) Take by mouth daily.   omeprazole (PRILOSEC) 20 MG capsule TAKE 1 CAPSULE BY MOUTH EVERY DAY   ondansetron (ZOFRAN) 4 MG tablet TAKE 1 TABLET BY MOUTH EVERY 8 HOURS AS NEEDED FOR NAUSEA AND VOMITING   ONETOUCH ULTRA test strip CHECK SUGAR TWICE A DAY   potassium chloride (KLOR-CON) 10 MEQ tablet TAKE 1 TABLET BY MOUTH 2-3 TIMES A WEEK WITH FUROSEMIDE AS DIRECTED   quinapril-hydrochlorothiazide (ACCURETIC) 20-12.5  MG tablet Take 1 tablet by mouth 2 (two) times daily.   rosuvastatin (CRESTOR) 5 MG tablet Take 1 tablet (5 mg total) by mouth daily.   metoprolol tartrate (LOPRESSOR) 100 MG tablet Take 1 tablet (100 mg total) by mouth once for 1 dose. Take 2 hours prior to your CTA procedure   No facility-administered medications prior to visit.    Review of Systems  Constitutional:  Negative for appetite change, chills, fatigue and fever.  Respiratory:  Negative for chest tightness and shortness of breath.   Cardiovascular:  Negative for chest pain and palpitations.  Gastrointestinal:  Positive for nausea.  Negative for abdominal pain and vomiting.  Musculoskeletal:  Positive for myalgias.  Neurological:  Negative for dizziness and weakness.      Objective    BP (!) 141/55 (BP Location: Right Arm, Patient Position: Sitting, Cuff Size: Normal)   Pulse 77   Temp 98.1 F (36.7 C) (Oral)   Wt 158 lb (71.7 kg)   SpO2 99%   BMI 27.12 kg/m  {Show previous vital signs (optional):23777}  Physical Exam Vitals reviewed.  Constitutional:      Appearance: She is well-developed.     Comments: She definitely looks younger than her age of 53.  HENT:     Head: Normocephalic and atraumatic.     Right Ear: External ear normal.     Left Ear: External ear normal.     Nose: Nose normal.  Eyes:     Conjunctiva/sclera: Conjunctivae normal.     Pupils: Pupils are equal, round, and reactive to light.  Neck:     Thyroid: No thyromegaly.  Cardiovascular:     Rate and Rhythm: Normal rate and regular rhythm.     Heart sounds: Murmur heard.     Comments: 3/6 Murmur at RUSB. Pulmonary:     Effort: Pulmonary effort is normal.     Breath sounds: Normal breath sounds.  Abdominal:     Palpations: Abdomen is soft.  Musculoskeletal:     Cervical back: Normal range of motion and neck supple.  Skin:    General: Skin is warm and dry.  Neurological:     General: No focal deficit present.     Mental Status: She is alert and oriented to person, place, and time.     Deep Tendon Reflexes: Reflexes are normal and symmetric.  Psychiatric:        Mood and Affect: Mood normal.        Behavior: Behavior normal.        Thought Content: Thought content normal.        Judgment: Judgment normal.      No results found for any visits on 09/10/21.  Assessment & Plan      1. Controlled type 2 diabetes mellitus with other circulatory complication, unspecified whether long term insulin use (HCC) 1C is 6.4.  I had a long discussion with the patient guarding trying to avoid hypoglycemia.  This time decrease her insulin  from 24 to 20 units daily with the idea of letting her blood sugars drift up a little bit.  When I see like her fastings at 87 years old to be 120 or higher and not below 100. - POCT glycosylated hemoglobin (Hb A1C)  2. Need for influenza vaccination   3. Coronary artery disease of native artery of native heart with stable angina pectoris ([place]) All risk factors treated  4. Hypercholesteremia Patient is now off of Crestor.  Feels better and I  think this is fine in this 87 year old.  He has myopathy.  Definitely associates to statins with causing this.  Co-Q10 for her muscle aches anyway.  5. L-S radiculopathy   6. Aortic valve disease Followed by cardiology  7. Nonrheumatic aortic valve stenosis   8. Benign essential HTN Controlled  9. Adult hypothyroidism Follow-up TSH  10. Angina pectoris ([place]) Has Nitrostat available.  Presently has no pain or angina recently  11. Primary insomnia Told her I would like her to stop the alprazolam completely     No follow-ups on file.      I, [name], MD, have reviewed all documentation for this visit. The documentation on 09/15/21 for the exam, diagnosis, procedures, and orders are all accurate and complete.    Lavanya Roa Cranford Mon, MD  [facility] [phone] (phone) [phone] (fax)  [place]

## 2021-09-10 NOTE — Patient Instructions (Signed)
Take over the counter turmeric Take over the counter Co-Q 10 Decrease Insulin from 24 to 20 units

## 2021-10-03 DIAGNOSIS — E113293 Type 2 diabetes mellitus with mild nonproliferative diabetic retinopathy without macular edema, bilateral: Secondary | ICD-10-CM | POA: Diagnosis not present

## 2021-10-03 DIAGNOSIS — H524 Presbyopia: Secondary | ICD-10-CM | POA: Diagnosis not present

## 2021-10-03 DIAGNOSIS — H31013 Macula scars of posterior pole (postinflammatory) (post-traumatic), bilateral: Secondary | ICD-10-CM | POA: Diagnosis not present

## 2021-10-08 ENCOUNTER — Other Ambulatory Visit: Payer: Self-pay | Admitting: Family Medicine

## 2021-10-08 DIAGNOSIS — R079 Chest pain, unspecified: Secondary | ICD-10-CM

## 2021-10-08 DIAGNOSIS — I209 Angina pectoris, unspecified: Secondary | ICD-10-CM

## 2021-10-08 NOTE — Telephone Encounter (Signed)
Requested Prescriptions  Pending Prescriptions Disp Refills  . omeprazole (PRILOSEC) 20 MG capsule [Pharmacy Med Name: OMEPRAZOLE DR 20 MG CAPSULE] 90 capsule 1    Sig: TAKE 1 CAPSULE BY MOUTH EVERY DAY     Gastroenterology: Proton Pump Inhibitors Passed - 10/08/2021  1:27 AM      Passed - Valid encounter within last 12 months    Recent Outpatient Visits          4 weeks ago Controlled type 2 diabetes mellitus with other circulatory complication, unspecified whether long term insulin use [facility])   [facility] Jerrol Banana., MD   1 month ago Encounter for [employer] annual wellness exam   [facility] Jerrol Banana., MD   5 months ago Controlled type 2 diabetes mellitus with other circulatory complication, unspecified whether long term insulin use [facility])   [facility] Jerrol Banana., MD   8 months ago Controlled type 2 diabetes mellitus with other circulatory complication, unspecified whether long term insulin use [facility])   [facility] Jerrol Banana., MD   1 year ago Controlled type 2 diabetes mellitus with other circulatory complication, unspecified whether long term insulin use [facility])   [facility] Jerrol Banana., MD      Future Appointments            In 3 months Jerrol Banana., MD [facility]

## 2021-11-21 ENCOUNTER — Ambulatory Visit: Payer: Self-pay

## 2021-11-21 DIAGNOSIS — R42 Dizziness and giddiness: Secondary | ICD-10-CM | POA: Diagnosis not present

## 2021-11-21 DIAGNOSIS — J069 Acute upper respiratory infection, unspecified: Secondary | ICD-10-CM | POA: Diagnosis not present

## 2021-11-21 NOTE — Progress Notes (Signed)
Virtual telephone visit    Virtual Visit via Telephone Note   This visit type was conducted due to national recommendations for restrictions regarding the COVID-19 Pandemic (e.g. social distancing) in an effort to limit this patient's exposure and mitigate transmission in our community. Due to her co-morbid illnesses, this patient is at least at moderate risk for complications without adequate follow up. This format is felt to be most appropriate for this patient at this time. The patient did not have access to video technology or had technical difficulties with video requiring transitioning to audio format only (telephone). Physical exam was limited to content and character of the telephone converstion.     Patient location: home Provider location: [place] involved in the visit: patient, provider  I discussed the limitations of evaluation and management by telemedicine and the availability of in person appointments. The patient expressed understanding and agreed to proceed.   Visit Date: 11/22/2021  Today's healthcare provider: Lavon Paganini, MD   Chief Complaint  Patient presents with   URI   I,Sulibeya S Dimas,acting as a scribe for Lavon Paganini, MD.,have documented all relevant documentation on the behalf of Lavon Paganini, MD,as directed by  Lavon Paganini, MD while in the presence of Lavon Paganini, MD.  Subjective    URI  This is a new problem. The current episode started in the past 7 days. The problem has been gradually improving. There has been no fever. Associated symptoms include coughing. Pertinent negatives include no chest pain, congestion, ear pain, nausea, rhinorrhea, sinus pain, sore throat, vomiting or wheezing. Associated symptoms comments: Clear phlegm. She has tried decongestant (mucinex) for the symptoms. The treatment provided moderate relief.   Symptoms started on 12/30.   No h/o COPD or asthma.  [place] RN for  [facility] came and checked her home vitals and told her that they were normal and her lungs were clear.   COVID vaccine x4 and flu vaccine this year.   Medications: Outpatient Medications Prior to Visit  Medication Sig   ALPRAZolam (XANAX) 0.5 MG tablet TAKE 1 TABLET BY MOUTH EVERY DAY AT BEDTIME AS NEEDED   Artificial Tear Ointment (DRY EYES OP) Apply 1 drop to eye daily.   aspirin EC 81 MG tablet Take 81 mg by mouth daily.   B-D ULTRAFINE III SHORT PEN 31G X 8 MM MISC CHECK SUGAR 2 TIMES DAILY, DX E11.9 (NEEDS ULTRA FINE PEN NEEDLES)   furosemide (LASIX) 20 MG tablet TAKE 1 TABLET (20 MG TOTAL) BY MOUTH AS DIRECTED. TAKE 1 TABLET (20 MG) 2-3 TIMES A WEEK   glucose blood (ONE TOUCH ULTRA TEST) test strip Check sugar twice daily.   HYDROcodone-acetaminophen (NORCO/VICODIN) 5-325 MG tablet Take 1 tablet by mouth every 6 (six) hours as needed for moderate pain.   insulin glargine (LANTUS SOLOSTAR) 100 UNIT/ML Solostar Pen INJECT 25 UNITS SUBCUTANEOUSLY (Patient taking differently: Inject 20 Units into the skin daily.)   Insulin Pen Needle 31G X 8 MM MISC Check sugar 2 times daily, DX E11.9 (needs ultra fine pen needles)   Lancets (ONETOUCH ULTRASOFT) lancets Check sugar twice daily DX E11.9   Magnesium 400 MG CAPS Take 400 mg by mouth daily.   Multiple Vitamins-Minerals (PRESERVISION AREDS) CAPS Take 1 capsule by mouth 2 (two) times daily.    nitroGLYCERIN (NITROSTAT) 0.4 MG SL tablet Place 1 tablet (0.4 mg total) under the tongue every 5 (five) minutes as needed for chest pain.   Omega-3 Fatty Acids (FISH OIL PO)  Take by mouth daily.   omeprazole (PRILOSEC) 20 MG capsule TAKE 1 CAPSULE BY MOUTH EVERY DAY   ondansetron (ZOFRAN) 4 MG tablet TAKE 1 TABLET BY MOUTH EVERY 8 HOURS AS NEEDED FOR NAUSEA AND VOMITING   ONETOUCH ULTRA test strip CHECK SUGAR TWICE A DAY   potassium chloride (KLOR-CON) 10 MEQ tablet TAKE 1 TABLET BY MOUTH 2-3 TIMES A WEEK WITH FUROSEMIDE AS DIRECTED    quinapril-hydrochlorothiazide (ACCURETIC) 20-12.5 MG tablet Take 1 tablet by mouth 2 (two) times daily.   isosorbide mononitrate (IMDUR) 30 MG 24 hr tablet Take 1 tablet (30 mg total) by mouth daily. (Patient not taking: Reported on 11/22/2021)   [DISCONTINUED] metoprolol tartrate (LOPRESSOR) 100 MG tablet Take 1 tablet (100 mg total) by mouth once for 1 dose. Take 2 hours prior to your CTA procedure   No facility-administered medications prior to visit.    Review of Systems  Constitutional:  Negative for appetite change, chills and fever.  HENT:  Negative for congestion, ear pain, postnasal drip, rhinorrhea, sinus pressure, sinus pain and sore throat.   Respiratory:  Positive for cough and shortness of breath. Negative for wheezing.   Cardiovascular:  Negative for chest pain and palpitations.  Gastrointestinal:  Negative for nausea and vomiting.      Objective    There were no vitals taken for this visit.      Assessment & Plan     1. Viral URI with cough - symptoms and exam c/w viral URI - no symptoms of strep pharyngitis, AOM, bacterial sinusitis, or other bacterial infection - reassuring that her vitals were normal and lungs clear - discussed symptomatic management, natural course, and return precautions   No follow-ups on file.    I discussed the assessment and treatment plan with the patient. The patient was provided an opportunity to ask questions and all were answered. The patient agreed with the plan and demonstrated an understanding of the instructions.   The patient was advised to call back or seek an in-person evaluation if the symptoms worsen or if the condition fails to improve as anticipated.  I provided 12 minutes of non-face-to-face time during this encounter.  I, Lavon Paganini, MD, have reviewed all documentation for this visit. The documentation on 11/22/21 for the exam, diagnosis, procedures, and orders are all accurate and complete.   Rise Traeger, Dionne Bucy, MD, MPH [facility]

## 2021-11-21 NOTE — Telephone Encounter (Signed)
°  Chief Complaint: dizziness Symptoms: congestion, dizziness, cough, fatigue Frequency: 7 days Pertinent Negatives: Patient denies fever Disposition: [] ED /[] Urgent Care (no appt availability in office) / [x] Appointment(In office/virtual)/ []  [facility]/ [] Home Care/ [] Refused Recommended Disposition /[] [facility]/ []  Follow-up with PCP Additional Notes: Pt states she is taking Mucinex OTC but symptoms aren't improving. She is scheduled for VV at 1440 with Dr. B but pt is unsure if her mychart works d/t her daughter always helps with that. Advised pt I put in Dr. B to call her for appt. Care advice given and pt verbalized understanding. No other questions/concerns noted.    Summary: dizzy, lightheaded, weak   Patient called in to get an appointment stated that she have been un well since 11/17/21 have been lightheaded, and dizzy feeling weaker each day. She was told to hold for a nurse but she hung up before I was able to get back over to her. Please call patient at Ph# [phone]       Reason for Disposition  [1] Sinus congestion (pressure, fullness) AND [2] present > 10 days  Answer Assessment - Initial Assessment Questions 2. ONSET: "When did the sinus pain start?"  (e.g., hours, days)      11/14/21 5. NASAL CONGESTION: "Is the nose blocked?" If Yes, ask: "Can you open it or must you breathe through your mouth?"     yes 6. NASAL DISCHARGE: "Do you have discharge from your nose?" If so ask, "What color?"     Clear 7. FEVER: "Do you have a fever?" If Yes, ask: "What is it, how was it measured, and when did it start?"      no 8. OTHER SYMPTOMS: "Do you have any other symptoms?" (e.g., sore throat, cough, earache, difficulty breathing)     Cough, dizziness, fatigue, congestion  Protocols used: Sinus Pain or Congestion-A-AH

## 2021-11-22 ENCOUNTER — Encounter: Payer: Self-pay | Admitting: Family Medicine

## 2021-11-22 ENCOUNTER — Telehealth (INDEPENDENT_AMBULATORY_CARE_PROVIDER_SITE_OTHER): Payer: PPO | Admitting: Family Medicine

## 2021-11-22 ENCOUNTER — Other Ambulatory Visit: Payer: Self-pay

## 2021-11-22 DIAGNOSIS — J069 Acute upper respiratory infection, unspecified: Secondary | ICD-10-CM | POA: Diagnosis not present

## 2021-11-24 ENCOUNTER — Ambulatory Visit (INDEPENDENT_AMBULATORY_CARE_PROVIDER_SITE_OTHER): Payer: PPO

## 2021-11-24 ENCOUNTER — Ambulatory Visit
Admission: EM | Admit: 2021-11-24 | Discharge: 2021-11-24 | Disposition: A | Discharge status: Home / Self Care | Payer: PPO | Attending: Family Medicine | Admitting: Family Medicine

## 2021-11-24 ENCOUNTER — Encounter: Payer: Self-pay | Admitting: Emergency Medicine

## 2021-11-24 DIAGNOSIS — R059 Cough, unspecified: Secondary | ICD-10-CM

## 2021-11-24 DIAGNOSIS — J189 Pneumonia, unspecified organism: Secondary | ICD-10-CM

## 2021-11-24 DIAGNOSIS — Z1152 Encounter for screening for COVID-19: Secondary | ICD-10-CM | POA: Diagnosis not present

## 2021-11-24 DIAGNOSIS — R5383 Other fatigue: Secondary | ICD-10-CM | POA: Diagnosis not present

## 2021-11-24 MED ORDER — CEFDINIR 300 MG PO CAPS
300.0000 mg | ORAL_CAPSULE | Freq: Two times a day (BID) | ORAL | 0 refills | Status: DC
Start: 2021-11-24 — End: 2021-11-28

## 2021-11-24 MED ORDER — DEXAMETHASONE SODIUM PHOSPHATE 10 MG/ML IJ SOLN
10.0000 mg | Freq: Once | INTRAMUSCULAR | Status: AC
  Administered 2021-11-24: 10 mg via INTRAMUSCULAR

## 2021-11-24 MED ORDER — DOXYCYCLINE HYCLATE 100 MG PO CAPS: 100.0000 mg | ORAL_CAPSULE | Freq: Two times a day (BID) | ORAL | 0 refills | Status: AC

## 2021-11-24 NOTE — ED Triage Notes (Signed)
Pt c/o cough, fatigue, and nausea x 1 week.

## 2021-11-24 NOTE — ED Provider Notes (Signed)
Roderic Palau    CSN: [id number] Arrival date & time: 11/24/21  1151      History   Chief Complaint Chief Complaint  Patient presents with   Cough   Fatigue   Nausea    HPI Jasmine Webster is a 88 y.o. female.   HPI Patient presents for evaluation of cough, fatigue, nausea x 10 days. Patient has history of diabetes, blood sugar has been slightly higher than normal averaging 179 fasting despite very poor appetite. Cough is non-productive. Daughter explains that patient is very active and has been extremely fatigue and sleeping more. Uncertain of fever. No known exposure to COVID or Flu. She has not tested at home. Patient denies shortness of breath. Endorses chest tightness with coughing.  Past Medical History:  Diagnosis Date   Arthritis    Cataract    cataract removal bilaterally approx 20 years ago   Chronic back pain    Coronary artery disease    COVID-19 virus infection 12/2020   Diabetes mellitus without complication (HCC)    Heart murmur    Hyperlipidemia    Hypertension     Patient Active Problem List   Diagnosis Date Noted   Hypertension    Coronary artery disease    COVID-19 virus infection 12/2020   Diabetes mellitus without complication ([place]) 50/56/9794   L-S radiculopathy 01/15/2019   Angina pectoris ([place]) 12/28/2018   Mixed hyperlipidemia 12/28/2018   Arthritis 03/23/2015   Cardiovascular disease 03/23/2015   Urinary system disease 03/23/2015   Malignant neoplasm of corpus uteri ([place]) 03/23/2015   Benign essential HTN 03/23/2015   Acid reflux 03/23/2015   Cardiac murmur 03/23/2015   Hypercholesteremia 03/23/2015   Adult hypothyroidism 03/23/2015   Cannot sleep 03/23/2015   Malaise and fatigue 03/23/2015   Muscle ache 03/23/2015   Arthritis, degenerative 03/23/2015   Diabetes mellitus type II, controlled ([place]) 03/23/2015    Past Surgical History:  Procedure Laterality Date   ABDOMINAL HYSTERECTOMY     APPENDECTOMY     BACK SURGERY      three   CARDIAC CATHETERIZATION     6+yrs no stents Dr. Verneda Skill   CATARACT EXTRACTION     THORACOLUMBAR SYRINGO SHUNT     TONSILLECTOMY AND ADENOIDECTOMY      OB History   No obstetric history on file.      Home Medications    Prior to Admission medications   Medication Sig Start Date End Date Taking? Authorizing Provider  cefdinir (OMNICEF) 300 MG capsule Take 1 capsule (300 mg total) by mouth 2 (two) times daily for 5 days. 11/24/21 11/29/21 Yes Scot Jun, FNP  doxycycline (VIBRAMYCIN) 100 MG capsule Take 1 capsule (100 mg total) by mouth 2 (two) times daily for 7 days. 11/24/21 12/01/21 Yes Scot Jun, FNP  ALPRAZolam Duanne Moron) 0.5 MG tablet TAKE 1 TABLET BY MOUTH EVERY DAY AT BEDTIME AS NEEDED 08/23/21   Jerrol Banana., MD  Artificial Tear Ointment (DRY EYES OP) Apply 1 drop to eye daily.    [provider]  aspirin EC 81 MG tablet Take 81 mg by mouth daily.    [provider]  B-D ULTRAFINE III SHORT PEN 31G X 8 MM MISC CHECK SUGAR 2 TIMES DAILY, DX E11.9 (NEEDS ULTRA FINE PEN NEEDLES) 01/08/21   Jerrol Banana., MD  furosemide (LASIX) 20 MG tablet TAKE 1 TABLET (20 MG TOTAL) BY MOUTH AS DIRECTED. TAKE 1 TABLET (20 MG) 2-3 TIMES A WEEK  07/02/21   Minna Merritts, MD  glucose blood (ONE TOUCH ULTRA TEST) test strip Check sugar twice daily. 06/22/19   Jerrol Banana., MD  HYDROcodone-acetaminophen (NORCO/VICODIN) 5-325 MG tablet Take 1 tablet by mouth every 6 (six) hours as needed for moderate pain.    [provider]  insulin glargine (LANTUS SOLOSTAR) 100 UNIT/ML Solostar Pen INJECT 25 UNITS SUBCUTANEOUSLY Patient taking differently: Inject 20 Units into the skin daily. 01/04/21   Jerrol Banana., MD  Insulin Pen Needle 31G X 8 MM MISC Check sugar 2 times daily, DX E11.9 (needs ultra fine pen needles) 02/19/16   [provider]  isosorbide mononitrate (IMDUR) 30 MG 24 hr tablet Take 1 tablet (30 mg  total) by mouth daily. Patient not taking: Reported on 11/22/2021 07/02/21   Minna Merritts, MD  Lancets [facility] ULTRASOFT) lancets Check sugar twice daily DX E11.9 04/21/17   Jerrol Banana., MD  Magnesium 400 MG CAPS Take 400 mg by mouth daily.    [provider]  Multiple Vitamins-Minerals (PRESERVISION AREDS) CAPS Take 1 capsule by mouth 2 (two) times daily.     [provider]  nitroGLYCERIN (NITROSTAT) 0.4 MG SL tablet Place 1 tablet (0.4 mg total) under the tongue every 5 (five) minutes as needed for chest pain. 07/02/21   Minna Merritts, MD  Omega-3 Fatty Acids (FISH OIL PO) Take by mouth daily.    [provider]  omeprazole (PRILOSEC) 20 MG capsule TAKE 1 CAPSULE BY MOUTH EVERY DAY 10/08/21   Jerrol Banana., MD  ondansetron (ZOFRAN) 4 MG tablet TAKE 1 TABLET BY MOUTH EVERY 8 HOURS AS NEEDED FOR NAUSEA AND VOMITING 07/25/21   Jerrol Banana., MD  [facility] ULTRA test strip CHECK SUGAR TWICE A DAY 06/08/20   Jerrol Banana., MD  potassium chloride (KLOR-CON) 10 MEQ tablet TAKE 1 TABLET BY MOUTH 2-3 TIMES A WEEK WITH FUROSEMIDE AS DIRECTED 08/03/21   Minna Merritts, MD  quinapril-hydrochlorothiazide (ACCURETIC) 20-12.5 MG tablet Take 1 tablet by mouth 2 (two) times daily. 07/16/21   Minna Merritts, MD    Family History Family History  Problem Relation Age of Onset   Heart disease Mother        died from MI   Heart attack Mother    Heart disease Sister    Sudden death Brother        shot and beaten to death during home invasion   Stroke Sister    Sudden death Sister        MVA   Cancer Brother        died from lung cancer   Sudden death Sister        79 days old   Cancer Brother        died from lung cancer   Stroke Brother        cause of death   Diabetes Father    Stroke Father    Pneumonia Father     Social History Social History   Tobacco Use   Smoking status: Never   Smokeless tobacco: Never  Vaping Use    Vaping Use: Never used  Substance Use Topics   Alcohol use: No    Alcohol/week: 0.0 standard drinks   Drug use: No     Allergies   Etodolac, Naproxen, Statins, Latex, Terbinafine, and Terbinafine hcl   Review of Systems Review of Systems Pertinent negatives listed in HPI  Physical Exam Triage Vital Signs ED Triage Vitals  Enc Vitals Group     BP 11/24/21 1258 (!) 155/66     Pulse Rate 11/24/21 1258 68     Resp 11/24/21 1258 16     Temp 11/24/21 1258 97.7 F (36.5 C)     Temp Source 11/24/21 1258 Oral     SpO2 11/24/21 1258 98 %     Weight --      Height --      Head Circumference --      Peak Flow --      Pain Score 11/24/21 1256 0     Pain Loc --      Pain Edu? --      Excl. in [place]? --    No data found.  Updated Vital Signs BP (!) 155/66 (BP Location: Right Arm)    Pulse 68    Temp 97.7 F (36.5 C) (Oral)    Resp 16    SpO2 98%   Visual Acuity Right Eye Distance:   Left Eye Distance:   Bilateral Distance:    Right Eye Near:   Left Eye Near:    Bilateral Near:     Physical Exam Vitals reviewed.  Constitutional:      Appearance: Normal appearance. She is ill-appearing.  HENT:     Head: Normocephalic and atraumatic.     Nose: Nose normal.  Eyes:     Extraocular Movements: Extraocular movements intact.     Pupils: Pupils are equal, round, and reactive to light.  Cardiovascular:     Rate and Rhythm: Normal rate and regular rhythm.  Pulmonary:     Effort: Pulmonary effort is normal.     Breath sounds: Decreased breath sounds present. No wheezing, rhonchi or rales.  Musculoskeletal:        General: Normal range of motion.     Cervical back: Normal range of motion.  Skin:    General: Skin is warm and dry.  Neurological:     General: No focal deficit present.     Mental Status: She is alert and oriented to person, place, and time.  Psychiatric:        Mood and Affect: Mood normal.        Behavior: Behavior normal.        Thought Content: Thought  content normal.        Judgment: Judgment normal.     UC Treatments / Results  Labs (all labs ordered are listed, but only abnormal results are displayed) Labs Reviewed  COVID-19, FLU A+B NAA    EKG   Radiology DG Chest 2 View  Result Date: 11/24/2021 CLINICAL DATA:  Productive cough, fatigue EXAM: CHEST - 2 VIEW COMPARISON:  07/13/2020 FINDINGS: The heart size and mediastinal contours are within normal limits. Diffuse bilateral interstitial pulmonary opacity. Disc degenerative disease of the thoracic spine. Partially imaged posterior thoracolumbar fusion. IMPRESSION: Diffuse bilateral interstitial pulmonary opacity, consistent with edema or infection. No focal airspace opacity. Electronically Signed   By: Delanna Ahmadi M.D.   On: 11/24/2021 13:26    Procedures Procedures (including critical care time)  Medications Ordered in UC Medications  dexamethasone (DECADRON) injection 10 mg (10 mg Intramuscular Given 11/24/21 1346)    Initial Impression / Assessment and Plan / UC Course  I have reviewed the triage vital signs and the nursing notes.  Pertinent labs & imaging results that were available during my care of the patient were reviewed by me and considered  in my medical decision making (see chart for details).    COVID/FLU pending to rule out underlying viral as source of current illness. Treating for pneumonia, Decadron IM given here in clinic. Treating patient with Omnicef and Doxycycline. Given hyperglycemia, will opt against placing on steroids. Incentive Spirometer provided prescribed 10 cycles at least 3-4 times per day. Strict ER precautions given. RTC PRN Final Clinical Impressions(s) / UC Diagnoses   Final diagnoses:  Encounter for screening for COVID-19  Pneumonia of both lungs due to infectious organism, unspecified part of lung     Discharge Instructions      COVID/FLU test will result within 3 days. I am treating you for pneumonia. Take both antibiotics at  the same time until all of are completed. You received steroid shot to help improve the congestion in your chest. Take tessalon pearls that you have at home 1 tablet every 8 hours as needed for cough. If at anytime your symptoms worsen or you experience severe weakness or shortness of breath, go immediately to the Emergency department.   ED Prescriptions     Medication Sig Dispense Auth. Provider   cefdinir (OMNICEF) 300 MG capsule Take 1 capsule (300 mg total) by mouth 2 (two) times daily for 5 days. 10 capsule Scot Jun, FNP   doxycycline (VIBRAMYCIN) 100 MG capsule Take 1 capsule (100 mg total) by mouth 2 (two) times daily for 7 days. 14 capsule Scot Jun, FNP      PDMP not reviewed this encounter.   Scot Jun, [place] 11/25/21 1815

## 2021-11-24 NOTE — Discharge Instructions (Signed)
COVID/FLU test will result within 3 days. I am treating you for pneumonia. Take both antibiotics at the same time until all of are completed. You received steroid shot to help improve the congestion in your chest. Take tessalon pearls that you have at home 1 tablet every 8 hours as needed for cough. If at anytime your symptoms worsen or you experience severe weakness or shortness of breath, go immediately to the Emergency department.

## 2021-11-26 ENCOUNTER — Emergency Department: Payer: PPO

## 2021-11-26 ENCOUNTER — Telehealth: Payer: Self-pay | Admitting: Family Medicine

## 2021-11-26 ENCOUNTER — Other Ambulatory Visit: Payer: Self-pay

## 2021-11-26 ENCOUNTER — Inpatient Hospital Stay
Admission: EM | Admit: 2021-11-26 | Discharge: 2021-11-28 | DRG: 640 | Disposition: A | Discharge status: Home Health | Payer: PPO | Attending: Internal Medicine | Admitting: Internal Medicine

## 2021-11-26 DIAGNOSIS — I209 Angina pectoris, unspecified: Secondary | ICD-10-CM

## 2021-11-26 DIAGNOSIS — E039 Hypothyroidism, unspecified: Secondary | ICD-10-CM | POA: Diagnosis present

## 2021-11-26 DIAGNOSIS — Z886 Allergy status to analgesic agent status: Secondary | ICD-10-CM | POA: Diagnosis not present

## 2021-11-26 DIAGNOSIS — Z7982 Long term (current) use of aspirin: Secondary | ICD-10-CM

## 2021-11-26 DIAGNOSIS — Z823 Family history of stroke: Secondary | ICD-10-CM

## 2021-11-26 DIAGNOSIS — F419 Anxiety disorder, unspecified: Secondary | ICD-10-CM | POA: Diagnosis present

## 2021-11-26 DIAGNOSIS — I2510 Atherosclerotic heart disease of native coronary artery without angina pectoris: Secondary | ICD-10-CM | POA: Diagnosis present

## 2021-11-26 DIAGNOSIS — M1990 Unspecified osteoarthritis, unspecified site: Secondary | ICD-10-CM | POA: Diagnosis not present

## 2021-11-26 DIAGNOSIS — Z79899 Other long term (current) drug therapy: Secondary | ICD-10-CM | POA: Diagnosis not present

## 2021-11-26 DIAGNOSIS — Z91040 Latex allergy status: Secondary | ICD-10-CM

## 2021-11-26 DIAGNOSIS — Z8249 Family history of ischemic heart disease and other diseases of the circulatory system: Secondary | ICD-10-CM

## 2021-11-26 DIAGNOSIS — Z66 Do not resuscitate: Secondary | ICD-10-CM | POA: Diagnosis present

## 2021-11-26 DIAGNOSIS — Z833 Family history of diabetes mellitus: Secondary | ICD-10-CM

## 2021-11-26 DIAGNOSIS — E7800 Pure hypercholesterolemia, unspecified: Secondary | ICD-10-CM | POA: Diagnosis present

## 2021-11-26 DIAGNOSIS — E782 Mixed hyperlipidemia: Secondary | ICD-10-CM | POA: Diagnosis not present

## 2021-11-26 DIAGNOSIS — Z888 Allergy status to other drugs, medicaments and biological substances status: Secondary | ICD-10-CM

## 2021-11-26 DIAGNOSIS — J209 Acute bronchitis, unspecified: Secondary | ICD-10-CM | POA: Diagnosis not present

## 2021-11-26 DIAGNOSIS — Z8616 Personal history of COVID-19: Secondary | ICD-10-CM

## 2021-11-26 DIAGNOSIS — Z794 Long term (current) use of insulin: Secondary | ICD-10-CM | POA: Diagnosis not present

## 2021-11-26 DIAGNOSIS — E1140 Type 2 diabetes mellitus with diabetic neuropathy, unspecified: Secondary | ICD-10-CM | POA: Diagnosis present

## 2021-11-26 DIAGNOSIS — E871 Hypo-osmolality and hyponatremia: Principal | ICD-10-CM

## 2021-11-26 DIAGNOSIS — K219 Gastro-esophageal reflux disease without esophagitis: Secondary | ICD-10-CM | POA: Diagnosis not present

## 2021-11-26 DIAGNOSIS — I10 Essential (primary) hypertension: Secondary | ICD-10-CM | POA: Diagnosis present

## 2021-11-26 DIAGNOSIS — J189 Pneumonia, unspecified organism: Secondary | ICD-10-CM | POA: Diagnosis not present

## 2021-11-26 DIAGNOSIS — J40 Bronchitis, not specified as acute or chronic: Secondary | ICD-10-CM | POA: Diagnosis not present

## 2021-11-26 DIAGNOSIS — I350 Nonrheumatic aortic (valve) stenosis: Secondary | ICD-10-CM | POA: Diagnosis not present

## 2021-11-26 DIAGNOSIS — Z20822 Contact with and (suspected) exposure to covid-19: Secondary | ICD-10-CM | POA: Diagnosis present

## 2021-11-26 DIAGNOSIS — R531 Weakness: Secondary | ICD-10-CM | POA: Diagnosis not present

## 2021-11-26 DIAGNOSIS — R079 Chest pain, unspecified: Secondary | ICD-10-CM

## 2021-11-26 LAB — URINALYSIS, ROUTINE W REFLEX MICROSCOPIC
Bilirubin Urine: NEGATIVE
Glucose, UA: NEGATIVE mg/dL
Hgb urine dipstick: NEGATIVE
Ketones, ur: NEGATIVE mg/dL
Leukocytes,Ua: NEGATIVE
Nitrite: NEGATIVE
Protein, ur: NEGATIVE mg/dL
Specific Gravity, Urine: 1.003 — ABNORMAL LOW (ref 1.005–1.030)
pH: 5 (ref 5.0–8.0)

## 2021-11-26 LAB — PHOSPHORUS: Phosphorus: 3.7 mg/dL (ref 2.5–4.6)

## 2021-11-26 LAB — RESP PANEL BY RT-PCR (FLU A&B, COVID) ARPGX2
Influenza A by PCR: NEGATIVE
Influenza B by PCR: NEGATIVE
SARS Coronavirus 2 by RT PCR: NEGATIVE

## 2021-11-26 LAB — BASIC METABOLIC PANEL
Anion gap: 8 (ref 5–15)
BUN: 17 mg/dL (ref 8–23)
CO2: 26 mmol/L (ref 22–32)
Calcium: 8.8 mg/dL — ABNORMAL LOW (ref 8.9–10.3)
Chloride: 91 mmol/L — ABNORMAL LOW (ref 98–111)
Creatinine, Ser: 0.96 mg/dL (ref 0.44–1.00)
GFR, Estimated: 55 mL/min — ABNORMAL LOW (ref >60)
Glucose, Bld: 177 mg/dL — ABNORMAL HIGH (ref 70–99)
Potassium: 3.9 mmol/L (ref 3.5–5.1)
Sodium: 125 mmol/L — ABNORMAL LOW (ref 135–145)

## 2021-11-26 LAB — HEMOGLOBIN A1C
Hgb A1c MFr Bld: 6.7 % — ABNORMAL HIGH (ref 4.8–5.6)
Mean Plasma Glucose: 145.59 mg/dL

## 2021-11-26 LAB — TROPONIN I (HIGH SENSITIVITY): Troponin I (High Sensitivity): 12 ng/L (ref <18)

## 2021-11-26 LAB — COVID-19, FLU A+B NAA
Influenza A, NAA: NOT DETECTED
Influenza B, NAA: NOT DETECTED
SARS-CoV-2, NAA: NOT DETECTED

## 2021-11-26 LAB — CBG MONITORING, ED
Glucose-Capillary: 152 mg/dL — ABNORMAL HIGH (ref 70–99)
Glucose-Capillary: 133 mg/dL — ABNORMAL HIGH (ref 70–99)

## 2021-11-26 LAB — CBC
HCT: 30.5 % — ABNORMAL LOW (ref 36.0–46.0)
Hemoglobin: 10 g/dL — ABNORMAL LOW (ref 12.0–15.0)
MCH: 31.1 pg (ref 26.0–34.0)
MCHC: 32.8 g/dL (ref 30.0–36.0)
MCV: 94.7 fL (ref 80.0–100.0)
Platelets: 311 10*3/uL (ref 150–400)
RBC: 3.22 MIL/uL — ABNORMAL LOW (ref 3.87–5.11)
RDW: 12.9 % (ref 11.5–15.5)
WBC: 8 10*3/uL (ref 4.0–10.5)
nRBC: 0 % (ref 0.0–0.2)

## 2021-11-26 LAB — BRAIN NATRIURETIC PEPTIDE: B Natriuretic Peptide: 133.2 pg/mL — ABNORMAL HIGH (ref 0.0–100.0)

## 2021-11-26 LAB — VITAMIN B12: Vitamin B-12: 533 pg/mL (ref 180–914)

## 2021-11-26 LAB — MAGNESIUM: Magnesium: 2 mg/dL (ref 1.7–2.4)

## 2021-11-26 LAB — TSH: TSH: 2.066 u[IU]/mL (ref 0.350–4.500)

## 2021-11-26 LAB — OSMOLALITY, URINE: Osmolality, Ur: 222 mOsm/kg — ABNORMAL LOW (ref 300–900)

## 2021-11-26 LAB — PROCALCITONIN: Procalcitonin: <0.1 ng/mL

## 2021-11-26 LAB — OSMOLALITY: Osmolality: 273 mOsm/kg — ABNORMAL LOW (ref 275–295)

## 2021-11-26 MED ORDER — ACETAMINOPHEN 650 MG RE SUPP: 650.0000 mg | Freq: Four times a day (QID) | RECTAL | Status: DC | PRN

## 2021-11-26 MED ORDER — ENOXAPARIN SODIUM 40 MG/0.4ML IJ SOSY
40.0000 mg | PREFILLED_SYRINGE | INTRAMUSCULAR | Status: DC
  Administered 2021-11-26 – 2021-11-27 (×2): 40 mg via SUBCUTANEOUS
  Filled 2021-11-26 (×2): qty 0.4

## 2021-11-26 MED ORDER — MAGNESIUM OXIDE -MG SUPPLEMENT 400 (240 MG) MG PO TABS
400.0000 mg | ORAL_TABLET | Freq: Every day | ORAL | Status: DC
  Administered 2021-11-27 – 2021-11-28 (×2): 400 mg via ORAL
  Filled 2021-11-26 (×2): qty 1

## 2021-11-26 MED ORDER — NITROGLYCERIN 0.4 MG SL SUBL: 0.4000 mg | SUBLINGUAL_TABLET | SUBLINGUAL | Status: DC | PRN

## 2021-11-26 MED ORDER — ONDANSETRON HCL 4 MG PO TABS: 4.0000 mg | ORAL_TABLET | Freq: Four times a day (QID) | ORAL | Status: DC | PRN

## 2021-11-26 MED ORDER — ACETAMINOPHEN 500 MG PO TABS: 1000.0000 mg | ORAL_TABLET | Freq: Four times a day (QID) | ORAL | Status: DC | PRN

## 2021-11-26 MED ORDER — INSULIN GLARGINE-YFGN 100 UNIT/ML ~~LOC~~ SOLN
20.0000 [IU] | Freq: Every day | SUBCUTANEOUS | Status: DC
  Administered 2021-11-26: 20 [IU] via SUBCUTANEOUS
  Filled 2021-11-26 (×3): qty 0.2

## 2021-11-26 MED ORDER — FUROSEMIDE 10 MG/ML IJ SOLN
20.0000 mg | Freq: Once | INTRAMUSCULAR | Status: AC
  Administered 2021-11-26: 20 mg via INTRAVENOUS
  Filled 2021-11-26: qty 4

## 2021-11-26 MED ORDER — ASPIRIN EC 81 MG PO TBEC
81.0000 mg | DELAYED_RELEASE_TABLET | Freq: Every day | ORAL | Status: DC
  Administered 2021-11-27 – 2021-11-28 (×2): 81 mg via ORAL
  Filled 2021-11-26 (×2): qty 1

## 2021-11-26 MED ORDER — INSULIN ASPART 100 UNIT/ML IJ SOLN: 0.0000 [IU] | Freq: Every day | INTRAMUSCULAR | Status: DC

## 2021-11-26 MED ORDER — INSULIN ASPART 100 UNIT/ML IJ SOLN
0.0000 [IU] | Freq: Three times a day (TID) | INTRAMUSCULAR | Status: DC
  Administered 2021-11-27: 3 [IU] via SUBCUTANEOUS
  Administered 2021-11-28: 2 [IU] via SUBCUTANEOUS
  Filled 2021-11-26 (×3): qty 1

## 2021-11-26 MED ORDER — OCUVITE-LUTEIN PO CAPS
1.0000 | ORAL_CAPSULE | Freq: Two times a day (BID) | ORAL | Status: DC
  Administered 2021-11-26 – 2021-11-28 (×4): 1 via ORAL
  Filled 2021-11-26 (×5): qty 1

## 2021-11-26 MED ORDER — LISINOPRIL 20 MG PO TABS
20.0000 mg | ORAL_TABLET | Freq: Every day | ORAL | Status: DC
  Administered 2021-11-26: 20 mg via ORAL
  Filled 2021-11-26: qty 1
  Filled 2021-11-26: qty 2

## 2021-11-26 MED ORDER — AMLODIPINE BESYLATE 5 MG PO TABS
5.0000 mg | ORAL_TABLET | Freq: Every day | ORAL | Status: DC
  Administered 2021-11-26 – 2021-11-28 (×2): 5 mg via ORAL
  Filled 2021-11-26 (×3): qty 1

## 2021-11-26 MED ORDER — PANTOPRAZOLE SODIUM 40 MG PO TBEC
40.0000 mg | DELAYED_RELEASE_TABLET | Freq: Every day | ORAL | Status: DC
  Administered 2021-11-27: 40 mg via ORAL
  Filled 2021-11-26: qty 1

## 2021-11-26 MED ORDER — ALPRAZOLAM 0.5 MG PO TABS: 0.5000 mg | ORAL_TABLET | Freq: Every evening | ORAL | Status: DC | PRN

## 2021-11-26 MED ORDER — HYDRALAZINE HCL 20 MG/ML IJ SOLN: 5.0000 mg | Freq: Four times a day (QID) | INTRAMUSCULAR | Status: DC | PRN

## 2021-11-26 MED ORDER — ONDANSETRON HCL 4 MG/2ML IJ SOLN: 4.0000 mg | Freq: Four times a day (QID) | INTRAMUSCULAR | Status: DC | PRN

## 2021-11-26 NOTE — ED Notes (Signed)
Pt care taken, pt resting, family wanted to know about antibiotics and fluids. Texted NP and asked her.

## 2021-11-26 NOTE — ED Triage Notes (Signed)
Pt to ED for bilateral PNA dx 2 days ago, sx started 9 days ago. Reports increased fatigue. Cough noted in triage.  Currently taking antibiotics.  NAD noted

## 2021-11-26 NOTE — H&P (Signed)
History and Physical   Jasmine Webster ZSW:[id number] DOB: 05-15-1926 DOA: 11/26/2021  PCP: Jerrol Banana., MD Outpatient Specialists: Dr. Mikle Bosworth, [facility] Patient coming from: Home  I have personally briefly reviewed patient's old medical records in [place].  Chief Concern: Weakness and fatigue  HPI: Jasmine Webster is a 88 y.o. female with medical history significant for hypertension, insulin-dependent diabetes mellitus, anxiety, insomnia, hyperlipidemia, GERD, neuropathy, who presents emergency department for chief concerns of fatigue and weakness.  She reports she has been feeling weak for the last 5 days at least. She endorses the nausea has been ongoing since before Christmas. She denies diarrhea, chest pain. She endorses epigastric tenderness that is noticeable when she coughs.   She reports she has had a cough that has been ongoing since December 25th, she reports it is nonproductive or producing of white phlegm. She denies fever, known sick contact, diarrhea, swelling in legs. She does not noticed any weight gain.   She reports that she has been compliant with outpatient antibiotic of doxycycline and cefdinir.  She does not perceive any improvement of her symptoms.  Social history: She lives at home with her daughter. She denies history of tobacco, etoh, and recreational drug use. She is retired and formerly worked in a [employer].   Vaccination history: She is vacinated for covid 19, 4 doses  ROS: Constitutional: no weight change, no fever ENT/Mouth: no sore throat, no rhinorrhea Eyes: no eye pain, no vision changes Cardiovascular: no chest pain, + dyspnea,  no edema, no palpitations Respiratory: + cough, no sputum, no wheezing Gastrointestinal: + nausea, no vomiting, no diarrhea, no constipation Genitourinary: no urinary incontinence, no dysuria, no hematuria Musculoskeletal: no arthralgias, no myalgias Skin: no skin lesions, no pruritus, Neuro: +  weakness, no loss of consciousness, no syncope Psych: no anxiety, no depression, + decrease appetite Heme/Lymph: no bruising, no bleeding  ED Course: Discussed with emergency medicine provider, patient requiring hospitalization for chief concerns of hyponatremia.  Vitals in the emergency department showed temperature of 97.6, respiration rate of 18, heart rate of 61, initial blood pressure 149/47, increased to 186/62, SPO2 of 98% on room air.  Labs in the emergency department showed serum sodium 125, potassium 3.9, chloride 91, bicarb of 26, BUN of 17, serum creatinine of 0.96, nonfasting blood glucose 177, GFR 55, WBC 8.0, hemoglobin 10, platelets of 311.  In the emergency department EDP also ordered a BNP and procalcitonin.  No medical intervention was performed in the emergency department.  Assessment/Plan  Principal Problem:   Hyponatremia Active Problems:   Benign essential HTN   Acid reflux   Hypercholesteremia   Adult hypothyroidism   Mixed hyperlipidemia   Coronary artery disease   # Weakness-etiology work-up in progress at this time # Fluid overload-presumed secondary to mild heart failure exacerbation - Differentials include hyponatremia versus pneumonia versus age-related debility - Procalcitonin was negative and I do not perceive consolidation and or atypical pneumonia on chest x-ray - Complete echo not ordered due to recent echo done in September 2022 - Discontinue home/outpatient antibiotic - Check B12, TSH - Incentive spirometry and flutter valve  # Hyponatremia-with hypervolemia, patient is also on a hydrochlorothiazide medication -Holding home hydrochlorothiazide at this time - Furosemide 20 mg IV one-time dose ordered - Strict I's and O's - BMP in the a.m.  # GERD-PPI resumed  # Hypertension-elevated - In care everywhere patient takes quinapril-hydrochlorothiazide 20-12 0.5 daily, amlodipine 5 mg daily, furosemide 20 mg daily prn -  Resumed quinapril 20 mg  daily - Hydralazine 5 mg IV every 6 hours as needed for SBP greater than 180, 3 doses ordered  # Insulin-dependent diabetes mellitus-resumed home insulin glargine 20 units subcutaneous daily - Patient is prescribed 25 units however she only takes 20 - Insulin SSI with at bedtime coverage ordered - Goal inpatient blood glucose level is 140-180  # Anxiety-patient takes alprazolam 0.5 mg p.o. nightly as needed for anxiety # Neuropathy-patient takes gabapentin 100 mg capsule # CAD-aspirin 81 mg daily; isosorbide mononitrate 30 mg daily # Hyperlipidemia-rosuvastatin 5 mg daily # GERD-PPI resumed  Chart reviewed.   11/24/2021: Patient had an urgent care visit and was prescribed cefdinir 300 mg capsule, 1 capsule twice daily for 5 days; doxycycline 100 mg p.o. twice daily for 7 days  Complete echo on 07/25/2021: Was read as ejection fraction estimated at 50 to 81%; grade 2 diastolic dysfunction.  The mitral valve is degenerative in appearance.  Mild calcification of the mitral valve leaflets.  Mild to moderate mitral annular calcification.  Mild mitral valve regurgitation.  Tricuspid valve regurgitation is trivial.  Tricuspid valve is normal in structure.  DVT prophylaxis: Enoxaparin Code Status: Full code Diet: Heart healthy/carb modified Family Communication: Updated Dominica Severin, son at bedside with patient's permission. Disposition Plan: Pending clinical course Consults called: PT, OT Admission status: Telemetry medical, observation  Past Medical History:  Diagnosis Date   Arthritis    Cataract    cataract removal bilaterally approx 20 years ago   Chronic back pain    Coronary artery disease    COVID-19 virus infection 12/2020   Diabetes mellitus without complication ([place])    Heart murmur    Hyperlipidemia    Hypertension    Past Surgical History:  Procedure Laterality Date   ABDOMINAL HYSTERECTOMY     APPENDECTOMY     BACK SURGERY     three   CARDIAC CATHETERIZATION     6+yrs no  stents Dr. Verneda Skill   CATARACT EXTRACTION     THORACOLUMBAR SYRINGO SHUNT     TONSILLECTOMY AND ADENOIDECTOMY     Social History:  reports that she has never smoked. She has never used smokeless tobacco. She reports that she does not drink alcohol and does not use drugs.  Allergies  Allergen Reactions   Etodolac Nausea Only, Other (See Comments) and Anxiety    Dizziness, too   Naproxen Other (See Comments) and Anxiety    Gave her an ulcer after taking it for years   Statins Other (See Comments)    No energy and myalgias   Latex Rash   Terbinafine Other (See Comments) and Rash    "Maybe made me nervous"   Terbinafine Hcl Rash   Family History  Problem Relation Age of Onset   Heart disease Mother        died from MI   Heart attack Mother    Heart disease Sister    Sudden death Brother        shot and beaten to death during home invasion   Stroke Sister    Sudden death Sister        MVA   Cancer Brother        died from lung cancer   Sudden death Sister        24 days old   Cancer Brother        died from lung cancer   Stroke Brother        cause of death  Diabetes Father    Stroke Father    Pneumonia Father    Family history: Family history reviewed and pertinent for heart disease in mother, sister, diabetes in father.  Prior to Admission medications   Medication Sig Start Date End Date Taking? Authorizing Provider  ALPRAZolam Duanne Moron) 0.5 MG tablet TAKE 1 TABLET BY MOUTH EVERY DAY AT BEDTIME AS NEEDED 08/23/21   Jerrol Banana., MD  Artificial Tear Ointment (DRY EYES OP) Apply 1 drop to eye daily.    [provider]  aspirin EC 81 MG tablet Take 81 mg by mouth daily.    [provider]  B-D ULTRAFINE III SHORT PEN 31G X 8 MM MISC CHECK SUGAR 2 TIMES DAILY, DX E11.9 (NEEDS ULTRA FINE PEN NEEDLES) 01/08/21   Jerrol Banana., MD  cefdinir (OMNICEF) 300 MG capsule Take 1 capsule (300 mg total) by mouth 2 (two) times daily for 5 days.  11/24/21 11/29/21  Scot Jun, FNP  doxycycline (VIBRAMYCIN) 100 MG capsule Take 1 capsule (100 mg total) by mouth 2 (two) times daily for 7 days. 11/24/21 12/01/21  Scot Jun, FNP  furosemide (LASIX) 20 MG tablet TAKE 1 TABLET (20 MG TOTAL) BY MOUTH AS DIRECTED. TAKE 1 TABLET (20 MG) 2-3 TIMES A WEEK 07/02/21   Minna Merritts, MD  glucose blood (ONE TOUCH ULTRA TEST) test strip Check sugar twice daily. 06/22/19   Jerrol Banana., MD  HYDROcodone-acetaminophen (NORCO/VICODIN) 5-325 MG tablet Take 1 tablet by mouth every 6 (six) hours as needed for moderate pain.    [provider]  insulin glargine (LANTUS SOLOSTAR) 100 UNIT/ML Solostar Pen INJECT 25 UNITS SUBCUTANEOUSLY Patient taking differently: Inject 20 Units into the skin daily. 01/04/21   Jerrol Banana., MD  Insulin Pen Needle 31G X 8 MM MISC Check sugar 2 times daily, DX E11.9 (needs ultra fine pen needles) 02/19/16   [provider]  isosorbide mononitrate (IMDUR) 30 MG 24 hr tablet Take 1 tablet (30 mg total) by mouth daily. Patient not taking: Reported on 11/22/2021 07/02/21   Minna Merritts, MD  Lancets [facility] ULTRASOFT) lancets Check sugar twice daily DX E11.9 04/21/17   Jerrol Banana., MD  Magnesium 400 MG CAPS Take 400 mg by mouth daily.    [provider]  Multiple Vitamins-Minerals (PRESERVISION AREDS) CAPS Take 1 capsule by mouth 2 (two) times daily.     [provider]  nitroGLYCERIN (NITROSTAT) 0.4 MG SL tablet Place 1 tablet (0.4 mg total) under the tongue every 5 (five) minutes as needed for chest pain. 07/02/21   Minna Merritts, MD  Omega-3 Fatty Acids (FISH OIL PO) Take by mouth daily.    [provider]  omeprazole (PRILOSEC) 20 MG capsule TAKE 1 CAPSULE BY MOUTH EVERY DAY 10/08/21   Jerrol Banana., MD  ondansetron (ZOFRAN) 4 MG tablet TAKE 1 TABLET BY MOUTH EVERY 8 HOURS AS NEEDED FOR NAUSEA AND VOMITING 07/25/21   Jerrol Banana., MD   [facility] ULTRA test strip CHECK SUGAR TWICE A DAY 06/08/20   Jerrol Banana., MD  potassium chloride (KLOR-CON) 10 MEQ tablet TAKE 1 TABLET BY MOUTH 2-3 TIMES A WEEK WITH FUROSEMIDE AS DIRECTED 08/03/21   Minna Merritts, MD  quinapril-hydrochlorothiazide (ACCURETIC) 20-12.5 MG tablet Take 1 tablet by mouth 2 (two) times daily. 07/16/21   Minna Merritts, MD   Physical Exam: Vitals:   11/26/21 1051 11/26/21 1052  11/26/21 1626  BP: (!) 147/50  (!) 149/47  Pulse: 67  61  Resp: 20  18  Temp: 97.8 F (36.6 C)  97.6 F (36.4 C)  TempSrc: Oral  Oral  SpO2: 98%  98%  Weight:  72.6 kg   Height:  5\' 4"  (1.626 m)    Constitutional: appears younger than chronological age, frail, NAD, calm, comfortable Eyes: PERRL, lids and conjunctivae normal ENMT: Mucous membranes are moist. Posterior pharynx clear of any exudate or lesions. Age-appropriate dentition. Hearing appropriate Neck: normal, supple, no masses, no thyromegaly Respiratory: clear to auscultation bilaterally, no wheezing, no crackles. Normal respiratory effort. No accessory muscle use.  Cardiovascular: Regular rate and rhythm, no murmurs / rubs / gallops. No extremity edema. 2+ pedal pulses. No carotid bruits.  Abdomen: no tenderness, no masses palpated, no hepatosplenomegaly. Bowel sounds positive.  Musculoskeletal: no clubbing / cyanosis. No joint deformity upper and lower extremities. Good ROM, no contractures, no atrophy. Normal muscle tone.  Skin: no rashes, lesions, ulcers. No induration Neurologic: Sensation intact. Strength 5/5 in all 4.  Psychiatric: Normal judgment and insight. Alert and oriented x 3. Normal mood.   EKG: independently reviewed, showing sinus rhythm with a rate of 66, QTc 473  Chest x-ray on Admission: I personally reviewed and I agree with radiologist reading as below.  DG Chest 2 View  Result Date: 11/26/2021 CLINICAL DATA:  Pneumonia EXAM: CHEST - 2 VIEW COMPARISON:  11/24/2021 FINDINGS: Stable  heart size. Aortic atherosclerosis. Mild bilateral interstitial prominence is improved from prior. No lobar consolidation. No pleural effusion or pneumothorax. Thoracolumbar spinal fusion hardware. Chronic deformity of the proximal left humerus. Bones appear demineralized. IMPRESSION: Mild bilateral interstitial prominence, improving from prior. Electronically Signed   By: Davina Poke D.O.   On: 11/26/2021 11:29    Labs on Admission: I have personally reviewed following labs  CBC: Recent Labs  Lab 11/26/21 1053  WBC 8.0  HGB 10.0*  HCT 30.5*  MCV 94.7  PLT 270   Basic Metabolic Panel: Recent Labs  Lab 11/26/21 1053  NA 125*  K 3.9  CL 91*  CO2 26  GLUCOSE 177*  BUN 17  CREATININE 0.96  CALCIUM 8.8*   GFR: Estimated Creatinine Clearance: 35.8 mL/min (by C-G formula based on SCr of 0.96 mg/dL).  Urine analysis:    Component Value Date/Time   COLORURINE YELLOW (A) 07/13/2020 1159   APPEARANCEUR CLEAR (A) 07/13/2020 1159   LABSPEC 1.013 07/13/2020 1159   PHURINE 5.0 07/13/2020 1159   GLUCOSEU NEGATIVE 07/13/2020 1159   HGBUR NEGATIVE 07/13/2020 1159   BILIRUBINUR NEGATIVE 07/13/2020 1159   BILIRUBINUR negative 05/26/2019 1558   KETONESUR NEGATIVE 07/13/2020 1159   PROTEINUR NEGATIVE 07/13/2020 1159   UROBILINOGEN 0.2 05/26/2019 1558   UROBILINOGEN 1.0 05/25/2009 0717   NITRITE NEGATIVE 07/13/2020 [place] 07/13/2020 1159   Dr. Tobie Poet [facility]  If 7PM-7AM, please contact overnight-coverage provider If 7AM-7PM, please contact day coverage provider [email or web address]  11/26/2021, 5:54 PM

## 2021-11-26 NOTE — ED Provider Notes (Signed)
[facility] Provider Note    Event Date/Time   First MD Initiated Contact with Patient 11/26/21 1638     (approximate)   History   Pneumonia   HPI  Jasmine Webster is a 88 y.o. female with diabetes, hypertension, aortic stenosis who comes in with concerns for pneumonia.  Patient was diagnosed with pneumonia 2 days ago.  On review of records patient was seen at an urgent care on 11/28/2021 and reported cough, fatigue, nausea for 10 days.  Patient was given IM Decadron and discharged with Omnicef and doxycycline for pneumonia.  Patient reports taking 2 days of these antibiotics.  Patient reports that she came in today for increasing weakness.  She states that she has had 2 near falls but did not fall and hit her head did not lose consciousness just feels more weak and wobbly with walking.  Denies any chest pain, but does have a little bit of shortness of breath with the coughing.  No abdominal pain.  She is some chronic left leg swelling is from a prior accident but denies any increasing swelling.  She reports that she has been sitting up with sleeping and due to being told to do that by the urgent care to help with the pneumonia.  She reports taking Lasix 20 mg 3 times a week.  Last took it on Friday.  Still having good urine output with clear urine.        Physical Exam   Triage Vital Signs: ED Triage Vitals  Enc Vitals Group     BP 11/26/21 1051 (!) 147/50     Pulse Rate 11/26/21 1051 67     Resp 11/26/21 1051 20     Temp 11/26/21 1051 97.8 F (36.6 C)     Temp Source 11/26/21 1051 Oral     SpO2 11/26/21 1051 98 %     Weight 11/26/21 1052 160 lb (72.6 kg)     Height 11/26/21 1052 5\' 4"  (1.626 m)     Head Circumference --      Peak Flow --      Pain Score 11/26/21 1052 0     Pain Loc --      Pain Edu? --      Excl. in [place]? --     Most recent vital signs: Vitals:   11/26/21 1051 11/26/21 1626  BP: (!) 147/50 (!) 149/47  Pulse: 67 61  Resp: 20 18   Temp: 97.8 F (36.6 C) 97.6 F (36.4 C)  SpO2: 98% 98%     General: Awake, no distress.  Frequently coughing CV:  Good peripheral perfusion.  Positive murmur Resp:  Normal effort.  Clear lung Abd:  No distention.  Soft nontender Legs:  Some trace edema of the left leg which patient states is baseline for her    ED Results / Procedures / Treatments   Labs (all labs ordered are listed, but only abnormal results are displayed) Labs Reviewed  CBC - Abnormal; Notable for the following components:      Result Value   RBC 3.22 (*)    Hemoglobin 10.0 (*)    HCT 30.5 (*)    All other components within normal limits  BASIC METABOLIC PANEL - Abnormal; Notable for the following components:   Sodium 125 (*)    Chloride 91 (*)    Glucose, Bld 177 (*)    Calcium 8.8 (*)    GFR, Estimated 55 (*)    All other  components within normal limits     EKG  My interpretation of EKG:  Normal sinus rate of 66 with left bundle branch block with no scar Bosa criteria.  T wave version aVL, normal intervals.  I reviewed her prior EKG from 06/2021 that was similar in nature.  RADIOLOGY I have reviewed the xray personally and agree with radiology read Patient has some minimal interstitial prominence bilaterally.  PROCEDURES:  Critical Care performed: No  .1-3 Lead EKG Interpretation Performed by: Vanessa Burt, MD Authorized by: Vanessa Fisher, MD     Interpretation: normal     ECG rate:  60s   ECG rate assessment: normal     Rhythm: sinus rhythm     Ectopy: none     MEDICATIONS ORDERED IN ED: Medications - No data to display   IMPRESSION / MDM / [place] / ED COURSE  I reviewed the triage vital signs and the nursing notes.  Patient has a history of aortic stenosis who comes in with shortness of breath, coughing, weakness  Differential diagnosis includes, but is not limited to, pneumonia versus CHF versus electrolyte abnormalities.   COVID, flu are negative from  yesterday.  Her hemoglobin is slightly lower than normal at 10 but white count is stable.  However her sodium is low at 125 and chloride is low at 91.  Patient reports good intake.  I wonder if this could be from her aortic stenosis and some heart failure.  We will add on BNP to evaluate for heart failure, procalcitonin to evaluate for infection and urine  to help decide if patient needs fluid or needs additional Lasix.  Given patient is currently hemodynamically stable we will hold off on treatment until these results come back.  At this time I doubt pulmonary embolism, ACS, DVT given up other symptoms more likely  On review of records from a cardiology note on 07/02/2021 patient was seen by Dr. Rockey Situ which showed moderate aortic valve stenosis and elevated right heart pressures.  Patient had a normal EF of 60 to 65% in March 2020 but did have some diastolic dysfunction.   The patient is on the cardiac monitor to evaluate for evidence of arrhythmia and/or significant heart rate changes.   Given patient's hyponatremia leading to weakness and recurrent near falls we will discuss possible team for admission  Discussed with Dr. Tobie Poet in the hospital team for admission.    FINAL CLINICAL IMPRESSION(S) / ED DIAGNOSES   Final diagnoses:  Hyponatremia  Weakness     Rx / DC Orders   ED Discharge Orders     None        Note:  This document was prepared using Dragon voice recognition software and may include unintentional dictation errors.   Vanessa [place], MD 11/26/21 [phone]

## 2021-11-26 NOTE — Telephone Encounter (Signed)
Pts daughter Katherina Right is calling to report that she is taking the pt to the ed for extreme fatigue.

## 2021-11-26 NOTE — ED Notes (Signed)
Np said that she did not want to start the antibiotics in the middle of the night. Wanted the morning md to see her before.

## 2021-11-27 DIAGNOSIS — Z8616 Personal history of COVID-19: Secondary | ICD-10-CM | POA: Diagnosis not present

## 2021-11-27 DIAGNOSIS — J189 Pneumonia, unspecified organism: Secondary | ICD-10-CM | POA: Diagnosis present

## 2021-11-27 DIAGNOSIS — F419 Anxiety disorder, unspecified: Secondary | ICD-10-CM | POA: Diagnosis present

## 2021-11-27 DIAGNOSIS — I2510 Atherosclerotic heart disease of native coronary artery without angina pectoris: Secondary | ICD-10-CM | POA: Diagnosis present

## 2021-11-27 DIAGNOSIS — Z20822 Contact with and (suspected) exposure to covid-19: Secondary | ICD-10-CM | POA: Diagnosis present

## 2021-11-27 DIAGNOSIS — Z888 Allergy status to other drugs, medicaments and biological substances status: Secondary | ICD-10-CM | POA: Diagnosis not present

## 2021-11-27 DIAGNOSIS — K219 Gastro-esophageal reflux disease without esophagitis: Secondary | ICD-10-CM | POA: Diagnosis present

## 2021-11-27 DIAGNOSIS — J40 Bronchitis, not specified as acute or chronic: Secondary | ICD-10-CM | POA: Diagnosis not present

## 2021-11-27 DIAGNOSIS — I350 Nonrheumatic aortic (valve) stenosis: Secondary | ICD-10-CM | POA: Diagnosis present

## 2021-11-27 DIAGNOSIS — R531 Weakness: Secondary | ICD-10-CM | POA: Diagnosis not present

## 2021-11-27 DIAGNOSIS — Z823 Family history of stroke: Secondary | ICD-10-CM | POA: Diagnosis not present

## 2021-11-27 DIAGNOSIS — E039 Hypothyroidism, unspecified: Secondary | ICD-10-CM | POA: Diagnosis present

## 2021-11-27 DIAGNOSIS — Z794 Long term (current) use of insulin: Secondary | ICD-10-CM | POA: Diagnosis not present

## 2021-11-27 DIAGNOSIS — M1990 Unspecified osteoarthritis, unspecified site: Secondary | ICD-10-CM | POA: Diagnosis present

## 2021-11-27 DIAGNOSIS — E1140 Type 2 diabetes mellitus with diabetic neuropathy, unspecified: Secondary | ICD-10-CM | POA: Diagnosis present

## 2021-11-27 DIAGNOSIS — Z833 Family history of diabetes mellitus: Secondary | ICD-10-CM | POA: Diagnosis not present

## 2021-11-27 DIAGNOSIS — E782 Mixed hyperlipidemia: Secondary | ICD-10-CM | POA: Diagnosis present

## 2021-11-27 DIAGNOSIS — E871 Hypo-osmolality and hyponatremia: Secondary | ICD-10-CM | POA: Diagnosis present

## 2021-11-27 DIAGNOSIS — Z8249 Family history of ischemic heart disease and other diseases of the circulatory system: Secondary | ICD-10-CM | POA: Diagnosis not present

## 2021-11-27 DIAGNOSIS — Z7982 Long term (current) use of aspirin: Secondary | ICD-10-CM | POA: Diagnosis not present

## 2021-11-27 DIAGNOSIS — Z79899 Other long term (current) drug therapy: Secondary | ICD-10-CM | POA: Diagnosis not present

## 2021-11-27 DIAGNOSIS — Z886 Allergy status to analgesic agent status: Secondary | ICD-10-CM | POA: Diagnosis not present

## 2021-11-27 DIAGNOSIS — Z91040 Latex allergy status: Secondary | ICD-10-CM | POA: Diagnosis not present

## 2021-11-27 DIAGNOSIS — I10 Essential (primary) hypertension: Secondary | ICD-10-CM | POA: Diagnosis present

## 2021-11-27 DIAGNOSIS — Z66 Do not resuscitate: Secondary | ICD-10-CM | POA: Diagnosis present

## 2021-11-27 DIAGNOSIS — J209 Acute bronchitis, unspecified: Secondary | ICD-10-CM | POA: Diagnosis present

## 2021-11-27 LAB — BASIC METABOLIC PANEL
Anion gap: 9 (ref 5–15)
BUN: 20 mg/dL (ref 8–23)
CO2: 27 mmol/L (ref 22–32)
Calcium: 8.6 mg/dL — ABNORMAL LOW (ref 8.9–10.3)
Chloride: 93 mmol/L — ABNORMAL LOW (ref 98–111)
Creatinine, Ser: 0.95 mg/dL (ref 0.44–1.00)
GFR, Estimated: 56 mL/min — ABNORMAL LOW (ref >60)
Glucose, Bld: 123 mg/dL — ABNORMAL HIGH (ref 70–99)
Potassium: 3.5 mmol/L (ref 3.5–5.1)
Sodium: 129 mmol/L — ABNORMAL LOW (ref 135–145)

## 2021-11-27 LAB — GLUCOSE, CAPILLARY
Glucose-Capillary: 82 mg/dL (ref 70–99)
Glucose-Capillary: 108 mg/dL — ABNORMAL HIGH (ref 70–99)
Glucose-Capillary: 175 mg/dL — ABNORMAL HIGH (ref 70–99)
Glucose-Capillary: 71 mg/dL (ref 70–99)
Glucose-Capillary: 81 mg/dL (ref 70–99)

## 2021-11-27 LAB — CBC
HCT: 29.6 % — ABNORMAL LOW (ref 36.0–46.0)
Hemoglobin: 9.7 g/dL — ABNORMAL LOW (ref 12.0–15.0)
MCH: 30 pg (ref 26.0–34.0)
MCHC: 32.8 g/dL (ref 30.0–36.0)
MCV: 91.6 fL (ref 80.0–100.0)
Platelets: 103 10*3/uL — ABNORMAL LOW (ref 150–400)
RBC: 3.23 MIL/uL — ABNORMAL LOW (ref 3.87–5.11)
RDW: 12.8 % (ref 11.5–15.5)
WBC: 10.7 10*3/uL — ABNORMAL HIGH (ref 4.0–10.5)
nRBC: 0 % (ref 0.0–0.2)

## 2021-11-27 LAB — PROCALCITONIN: Procalcitonin: <0.1 ng/mL

## 2021-11-27 MED ORDER — SODIUM CHLORIDE 0.9 % IV SOLN: Freq: Once | INTRAVENOUS | Status: AC

## 2021-11-27 MED ORDER — LISINOPRIL 20 MG PO TABS
20.0000 mg | ORAL_TABLET | Freq: Every day | ORAL | Status: DC
  Administered 2021-11-27: 20 mg via ORAL
  Filled 2021-11-27 (×2): qty 1

## 2021-11-27 MED ORDER — PANTOPRAZOLE SODIUM 40 MG PO TBEC
40.0000 mg | DELAYED_RELEASE_TABLET | Freq: Two times a day (BID) | ORAL | Status: DC
  Administered 2021-11-27 – 2021-11-28 (×2): 40 mg via ORAL
  Filled 2021-11-27 (×2): qty 1

## 2021-11-27 MED ORDER — BENZONATATE 100 MG PO CAPS
100.0000 mg | ORAL_CAPSULE | Freq: Three times a day (TID) | ORAL | Status: DC
  Administered 2021-11-27 – 2021-11-28 (×3): 100 mg via ORAL
  Filled 2021-11-27 (×3): qty 1

## 2021-11-27 MED ORDER — GUAIFENESIN-DM 100-10 MG/5ML PO SYRP: 5.0000 mL | ORAL_SOLUTION | ORAL | Status: DC | PRN

## 2021-11-27 MED ORDER — SODIUM CHLORIDE 0.9 % IV SOLN: INTRAVENOUS | Status: DC

## 2021-11-27 MED ORDER — DOXYCYCLINE HYCLATE 100 MG PO TABS
100.0000 mg | ORAL_TABLET | Freq: Two times a day (BID) | ORAL | Status: DC
  Administered 2021-11-27 – 2021-11-28 (×3): 100 mg via ORAL
  Filled 2021-11-27 (×3): qty 1

## 2021-11-27 MED ORDER — GUAIFENESIN ER 600 MG PO TB12
600.0000 mg | ORAL_TABLET | Freq: Two times a day (BID) | ORAL | Status: DC
  Administered 2021-11-27 – 2021-11-28 (×3): 600 mg via ORAL
  Filled 2021-11-27 (×3): qty 1

## 2021-11-27 MED ORDER — CEFDINIR 300 MG PO CAPS
300.0000 mg | ORAL_CAPSULE | Freq: Two times a day (BID) | ORAL | Status: DC
Start: 2021-11-27 — End: 2021-11-28
  Administered 2021-11-27 (×2): 300 mg via ORAL
  Filled 2021-11-27 (×3): qty 1

## 2021-11-27 NOTE — Plan of Care (Signed)
°  Problem: Clinical Measurements: Goal: Diagnostic test results will improve Outcome: Progressing Goal: Respiratory complications will improve Outcome: Progressing Goal: Cardiovascular complication will be avoided Outcome: Progressing   Problem: Activity: Goal: Risk for activity intolerance will decrease Outcome: Progressing   Problem: Safety: Goal: Ability to remain free from injury will improve Outcome: Progressing

## 2021-11-27 NOTE — Plan of Care (Signed)
  Problem: Clinical Measurements: Goal: Diagnostic test results will improve Outcome: Progressing Goal: Respiratory complications will improve Outcome: Progressing   Problem: Activity: Goal: Risk for activity intolerance will decrease Outcome: Progressing   Problem: Safety: Goal: Ability to remain free from injury will improve Outcome: Progressing   

## 2021-11-27 NOTE — Progress Notes (Signed)
[facility] [place] NAME: Jasmine Webster    MR#:  [id number]  DATE OF BIRTH:  10-30-1926  SUBJECTIVE:  daughter at bedside. Patient came in with cough for about 10 days. Cough is mainly dry with occasional clear expectoration. Denies any fever or chills. Patient was seen by PCP on the fifth and was diagnosed with viral URI. She was seen at urgent care on 7 January and chest x-ray showed some patchy infiltrate was started on cefdinir and doxycycline as outpatient. Due to cough patient was not able to eat much and felt weak. She was found to have sodium 125. Today feeling a little better. Continues with dry cough. No fever. Eating a little bit better  REVIEW OF SYSTEMS:   Review of Systems  Constitutional:  Negative for chills, fever and weight loss.  HENT:  Negative for ear discharge, ear pain and nosebleeds.   Eyes:  Negative for blurred vision, pain and discharge.  Respiratory:  Positive for cough. Negative for sputum production, shortness of breath, wheezing and stridor.   Cardiovascular:  Negative for chest pain, palpitations, orthopnea and PND.  Gastrointestinal:  Negative for abdominal pain, diarrhea, nausea and vomiting.  Genitourinary:  Negative for frequency and urgency.  Musculoskeletal:  Negative for back pain and joint pain.  Neurological:  Positive for weakness. Negative for sensory change, speech change and focal weakness.  Psychiatric/Behavioral:  Negative for depression and hallucinations. The patient is not nervous/anxious.   Tolerating Diet:yes Tolerating PT:   DRUG ALLERGIES:   Allergies  Allergen Reactions   Etodolac Nausea Only, Other (See Comments) and Anxiety    Dizziness, too   Naproxen Other (See Comments) and Anxiety    Gave her an ulcer after taking it for years   Statins Other (See Comments)    No energy and myalgias   Latex Rash   Terbinafine Other (See Comments) and Rash    "Maybe made me nervous"    Terbinafine Hcl Rash    VITALS:  Blood pressure (!) 163/56, pulse 69, temperature 97.8 F (36.6 C), resp. rate 20, height 5\' 4"  (1.626 m), weight 72.6 kg, SpO2 100 %.  PHYSICAL EXAMINATION:   Physical Exam  GENERAL:  88 y.o.-year-old patient lying in the bed with no acute distress.  HEENT: Head atraumatic, normocephalic. Oropharynx and nasopharynx clear.  LUNGS: Normal breath sounds bilaterally, no wheezing, rales, rhonchi. No use of accessory muscles of respiration.  CARDIOVASCULAR: S1, S2 normal. No murmurs, rubs, or gallops.  ABDOMEN: Soft, nontender, nondistended. Bowel sounds present. No organomegaly or mass.  EXTREMITIES: No cyanosis, clubbing or edema b/l.    NEUROLOGIC: nonfocal PSYCHIATRIC:  patient is alert and oriented x 3.  SKIN: No obvious rash, lesion, or ulcer.   LABORATORY PANEL:  CBC Recent Labs  Lab 11/27/21 0423  WBC 10.7*  HGB 9.7*  HCT 29.6*  PLT 103*    Chemistries  Recent Labs  Lab 11/26/21 1817 11/27/21 0423  NA  --  129*  K  --  3.5  CL  --  93*  CO2  --  27  GLUCOSE  --  123*  BUN  --  20  CREATININE  --  0.95  CALCIUM  --  8.6*  MG 2.0  --    Cardiac Enzymes No results for input(s): TROPONINI in the last 168 hours. RADIOLOGY:  DG Chest 2 View  Result Date: 11/26/2021 CLINICAL DATA:  Pneumonia EXAM: CHEST - 2 VIEW COMPARISON:  11/24/2021  FINDINGS: Stable heart size. Aortic atherosclerosis. Mild bilateral interstitial prominence is improved from prior. No lobar consolidation. No pleural effusion or pneumothorax. Thoracolumbar spinal fusion hardware. Chronic deformity of the proximal left humerus. Bones appear demineralized. IMPRESSION: Mild bilateral interstitial prominence, improving from prior. Electronically Signed   By: Davina Poke D.O.   On: 11/26/2021 11:29   ASSESSMENT AND PLAN:   Jasmine Webster is a 88 y.o. female with medical history significant for hypertension, insulin-dependent diabetes mellitus, anxiety, insomnia,  hyperlipidemia, GERD, neuropathy, who presents emergency department for chief concerns of fatigue and weakness. She reports she has had a cough that has been ongoing since December 25th, she reports it is nonproductive or producing of white phlegm. She denies fever, known sick contact, diarrhea, swelling in legs. She does not noticed any weight gain.   Hyponatremia in the setting of viral URI/pneumonia -- patient came in with generalized weakness, poor PO intake and dry cough -- will give IV fluids -- patient encouraged to eat small meals -- sodium 125--- 129-- IV fluids -- hold hydrochlorothiazide  viral URI with possible mild pneumonia superimposed -- patient was started on oral recycling and cefdinir as outpatient which will continue -- Pro calcitonin times two negative -- white count 10.7 -- no fever -- PRN cough meds  type II diabetes with neuropathy -- sliding scale insulin -- continue gabapentin  Hypertension -- will continue home meds -- hold hydrochlorothiazide  CAD. History of aortic stenosis -- no chest pain. Continue aspirin and isosorbide  Gerd -- PPI BID  low platelet count --? Viral versus lab error -- check labs in the morning     Procedures: Family communication :dter at bedside, Dr Juliet Rude Consults :none CODE STATUS: full DVT Prophylaxis :scd Level of care: Telemetry Medical Status is: Inpatient  Remains inpatient appropriate because: hyponatremia        TOTAL TIME TAKING CARE OF THIS PATIENT: 25 minutes.  >50% time spent on counselling and coordination of care  Note: This dictation was prepared with Dragon dictation along with smaller phrase technology. Any transcriptional errors that result from this process are unintentional.  Fritzi Mandes M.D    [facility]   CC: Primary care physician; Jasmine Webster., MD Patient ID: Jasmine Webster, female   DOB: June 30, 1926, 88 y.o.   MRN: [id number]

## 2021-11-27 NOTE — Evaluation (Signed)
Occupational Therapy Evaluation Patient Details Name: Jasmine Webster MRN: [id number] DOB: September 18, 1926 Today's Date: 11/27/2021   History of Present Illness 88 y.o. female with diabetes, hypertension, aortic stenosis who comes in with concerns for pneumonia.  Patient was diagnosed with pneumonia 2 days ago at urgent care.  Has had continued cough, fatigue, nausea for 10 days.  Now here with progressive weakness and 2 near falls.   Clinical Impression   Patient presenting with decreased Ind in self care, balance, functional mobility/transfers, endurance, and safety awareness. Patient reports being mod I with use of rollator at baseline. She uses cane in community and has stair lift at home to get to second level where bedroom and bath are located. Pt has several family members living at home to assist as needed. Patient currently functioning at supervision but reports nausea and dizziness with standing from EOB. She was able to sit on EOB for grooming tasks with set up A to obtain all needed items. Patient will benefit from acute OT to increase overall independence in the areas of ADLs, functional mobility, and safety awareness in order to safely discharge home with family.      Recommendations for follow up therapy are one component of a multi-disciplinary discharge planning process, led by the attending physician.  Recommendations may be updated based on patient status, additional functional criteria and insurance authorization.   Follow Up Recommendations  Home health OT    Assistance Recommended at Discharge Intermittent Supervision/Assistance  Patient can return home with the following A little help with walking and/or transfers;A little help with bathing/dressing/bathroom;Help with stairs or ramp for entrance    Functional Status Assessment  Patient has had a recent decline in their functional status and demonstrates the ability to make significant improvements in function in a reasonable  and predictable amount of time.  Equipment Recommendations  None recommended by OT       Precautions / Restrictions Precautions Precautions: Fall Restrictions Weight Bearing Restrictions: No      Mobility Bed Mobility Overal bed mobility: Modified Independent             General bed mobility comments: increased time and effort    Transfers Overall transfer level: Needs assistance Equipment used: Rolling walker (2 wheels) Transfers: Sit to/from Stand Sit to Stand: Supervision           General transfer comment: minimal cuing for positioning and sequencing      Balance Overall balance assessment: Modified Independent                                         ADL either performed or assessed with clinical judgement   ADL Overall ADL's : Needs assistance/impaired     Grooming: Wash/dry hands;Wash/dry face;Oral care;Sitting;Set up;Supervision/safety                                       Vision Patient Visual Report: No change from baseline              Pertinent Vitals/Pain Pain Assessment: No/denies pain        Extremity/Trunk Assessment Upper Extremity Assessment Upper Extremity Assessment: Overall WFL for tasks assessed   Lower Extremity Assessment Lower Extremity Assessment: Overall WFL for tasks assessed       Communication Communication Communication: No difficulties  Cognition Arousal/Alertness: Awake/alert Behavior During Therapy: WFL for tasks assessed/performed Overall Cognitive Status: Within Functional Limits for tasks assessed                                       General Comments  BP: supine 147/51, sitting 137/99, standing 118/70, seated post ambulation 119/80.  No orthostatic symptoms t/o session - frequently checking on this            Home Living Family/patient expects to be discharged to:: Private residence Living Arrangements: Children;Other relatives Available Help  at Discharge: Available 24 hours/day   Home Access: Stairs to enter Entrance Stairs-Number of Steps: 3 Entrance Stairs-Rails: Can reach both Home Layout: Two level Alternate Level Stairs-Number of Steps: flight, has stair lift   Bathroom Shower/Tub: Walk-in shower         Home Equipment: Conservation officer, nature (2 wheels);Rollator (4 wheels);Cane - single point;Shower seat          Prior Functioning/Environment Prior Level of Function : Independent/Modified Independent             Mobility Comments: uses RW 24/7, reports community ambulation for limited distances, always with family ADLs Comments: Pt is able to perform ADLs independent at baseline and assist with some IADLs such as laundry.        OT Problem List: Decreased strength;Decreased activity tolerance;Impaired balance (sitting and/or standing);Decreased safety awareness      OT Treatment/Interventions: Self-care/ADL training;Therapeutic exercise;Therapeutic activities;Energy conservation;DME and/or AE instruction;Patient/family education;Manual therapy;Balance training    OT Goals(Current goals can be found in the care plan section) Acute Rehab OT Goals Patient Stated Goal: to go home and feel better OT Goal Formulation: With patient/family Time For Goal Achievement: 12/11/21 Potential to Achieve Goals: Good ADL Goals Pt Will Perform Grooming: with modified independence;standing Pt Will Perform Lower Body Dressing: with modified independence;sit to/from stand Pt Will Transfer to Toilet: with modified independence;ambulating Pt Will Perform Toileting - Clothing Manipulation and hygiene: with modified independence;sit to/from stand  OT Frequency: Min 2X/week       AM-PAC OT "6 Clicks" Daily Activity     Outcome Measure Help from another person eating meals?: None Help from another person taking care of personal grooming?: None Help from another person toileting, which includes using toliet, bedpan, or urinal?: A  Little   Help from another person to put on and taking off regular upper body clothing?: None Help from another person to put on and taking off regular lower body clothing?: A Little 6 Click Score: 18   End of Session Nurse Communication: Mobility status  Activity Tolerance: Patient limited by fatigue Patient left: with call bell/phone within reach;in bed;with bed alarm set;with family/visitor present  OT Visit Diagnosis: Unsteadiness on feet (R26.81);Muscle weakness (generalized) (M62.81);Repeated falls (R29.6)                Time: 2423-5361 OT Time Calculation (min): 24 min Charges:  OT General Charges $OT Visit: 1 Visit OT Evaluation $OT Eval Moderate Complexity: 1 Mod OT Treatments $Self Care/Home Management : 8-22 mins  Darleen Crocker, MS, OTR/L , CBIS ascom [phone]  11/27/21, 2:50 PM

## 2021-11-27 NOTE — TOC Initial Note (Signed)
Transition of Care [facility]) - Initial/Assessment Note    Patient Details  Name: Jasmine Webster MRN: [id number] Date of Birth: 05-15-1926  [facility] CM/SW Contact:    Beverly Sessions, RN Phone Number: 11/27/2021, 2:44 PM  Clinical Narrative:                  Patient admitted with hyponatremia.  Patient lives at home with daughter PCP Rosanna Randy - daughter takes to appointments Denies issues obtaining medications  Patient has RW and rollator in the home.  Therapy recommending home health PT.  Patient declines home health at discharge.  Daughter [name] states she will let her mom make the decision if she would like  services.   Please consult TOC if needs arise  Expected Discharge Plan: Home/Self Care Barriers to Discharge: Continued Medical Work up   Patient Goals and CMS Choice        Expected Discharge Plan and Services Expected Discharge Plan: Home/Self Care                                              Prior Living Arrangements/Services   Lives with:: Adult Children Patient language and need for interpreter reviewed:: Yes Do you feel safe going back to the place where you live?: Yes      Need for Family Participation in Patient Care: Yes (Comment) Care giver support system in place?: Yes (comment) Current home services: DME Criminal Activity/Legal Involvement Pertinent to Current Situation/Hospitalization: No - Comment as needed  Activities of Daily Living Home Assistive Devices/Equipment: Eyeglasses, Dentures (specify type), Grab bars in shower, Cane (specify quad or straight) (upper and lower dentures) ADL Screening (condition at time of admission) Patient's cognitive ability adequate to safely complete daily activities?: Yes Is the patient deaf or have difficulty hearing?: Yes (hearing aids at home) Does the patient have difficulty seeing, even when wearing glasses/contacts?: No Does the patient have difficulty concentrating, remembering,  or making decisions?: No Patient able to express need for assistance with ADLs?: Yes Does the patient have difficulty dressing or bathing?: Yes Independently performs ADLs?: No Communication: Independent Dressing (OT): Needs assistance Is this a change from baseline?: Change from baseline, expected to last <3days Grooming: Independent Feeding: Independent Bathing: Needs assistance Is this a change from baseline?: Change from baseline, expected to last <3 days Toileting: Needs assistance Is this a change from baseline?: Change from baseline, expected to last <3 days In/Out Bed: Needs assistance Is this a change from baseline?: Change from baseline, expected to last <3 days Walks in Home: Independent with device (comment) (walker) Does the patient have difficulty walking or climbing stairs?: Yes Weakness of Legs: Both Weakness of Arms/Hands: None  Permission Sought/Granted                  Emotional Assessment       Orientation: : Oriented to Self, Oriented to Place, Oriented to  Time, Oriented to Situation Alcohol / Substance Use: Not Applicable Psych Involvement: No (comment)  Admission diagnosis:  Hyponatremia [E87.1] Weakness [R53.1] Patient Active Problem List   Diagnosis Date Noted   Hyponatremia 11/26/2021   Hypertension    Coronary artery disease    COVID-19 virus infection 12/2020   Diabetes mellitus without complication ([name]) 13/24/4010   L-S radiculopathy 01/15/2019   Angina pectoris ([place]) 12/28/2018   Mixed hyperlipidemia 12/28/2018  Arthritis 03/23/2015   Cardiovascular disease 03/23/2015   Urinary system disease 03/23/2015   Malignant neoplasm of corpus uteri ([place]) 03/23/2015   Benign essential HTN 03/23/2015   Acid reflux 03/23/2015   Cardiac murmur 03/23/2015   Hypercholesteremia 03/23/2015   Adult hypothyroidism 03/23/2015   Cannot sleep 03/23/2015   Malaise and fatigue 03/23/2015   Muscle ache 03/23/2015   Arthritis, degenerative 03/23/2015    Diabetes mellitus type II, controlled ([place]) 03/23/2015   PCP:  Jerrol Banana., MD Pharmacy:   [id number] [name], [place] [address] Phone: [phone] Fax: [phone]     Social Determinants of Health (SDOH) Interventions    Readmission Risk Interventions No flowsheet data found.

## 2021-11-27 NOTE — Evaluation (Signed)
Physical Therapy Evaluation Patient Details Name: Jasmine Webster MRN: [id number] DOB: [birth year] Today's Date: 11/27/2021  History of Present Illness  88 y.o. female with diabetes, hypertension, aortic stenosis who comes in with concerns for pneumonia.  Patient was diagnosed with pneumonia 2 days ago at urgent care.  Has had continued cough, fatigue, nausea for 10 days.  Now here with progressive weakness and 2 near falls.  Clinical Impression  Pt did well with PT exam and apart from some nausea reports she is generally feeling better.  She had appropriate BP readings t/o the session with no orthostatic changes and no lightheadeness, etc.  She showed good overall effort but after ~40 ft of ambulation she did have some nausea and requested to sit; HR 70 and O2 high 90s with activity.  She has 24/7 assist available and should be able able to safely return home once medically cleared, she is weaker than her baseline and PT recommending HHPT.      Recommendations for follow up therapy are one component of a multi-disciplinary discharge planning process, led by the attending physician.  Recommendations may be updated based on patient status, additional functional criteria and insurance authorization.  Follow Up Recommendations Home health PT    Assistance Recommended at Discharge PRN  Patient can return home with the following       Equipment Recommendations None recommended by PT  Recommendations for Other Services       Functional Status Assessment Patient has had a recent decline in their functional status and demonstrates the ability to make significant improvements in function in a reasonable and predictable amount of time.     Precautions / Restrictions Precautions Precautions: Fall Restrictions Weight Bearing Restrictions: No      Mobility  Bed Mobility Overal bed mobility: Modified Independent             General bed mobility comments: Pt able to get herself to EOB w/o  assist    Transfers Overall transfer level: Modified independent Equipment used: Rolling walker (2 wheels)               General transfer comment: minimal cuing for positioning and sequencing    Ambulation/Gait Ambulation/Gait assistance: Supervision Gait Distance (Feet): 40 Feet Assistive device: Rolling walker (2 wheels)         General Gait Details: Pt with slow but steady gait.  O2 remains in high 90s t/o the effort on room air, HR 70s.  Requests to stop due to some nausea, but no lightheadedness or excessive fatigue.  Stairs            Wheelchair Mobility    Modified Rankin (Stroke Patients Only)       Balance Overall balance assessment: Modified Independent (appropriate use of walker with no LOBs)                                           Pertinent Vitals/Pain Pain Assessment: No/denies pain ("Just a little from coughing")    Home Living Family/patient expects to be discharged to:: Private residence Living Arrangements: Children Available Help at Discharge: Available 24 hours/day (son-in-law available 24/7, daughter works from home multiple days a week)   Home Access: Stairs to enter Entrance Stairs-Rails: Can reach both Entrance Stairs-Number of Steps: 3 Alternate Level Stairs-Number of Steps: flight, has stair lift Home Layout: Two level Home Equipment: Conservation officer, nature (2  wheels);Rollator (4 wheels)      Prior Function Prior Level of Function : Independent/Modified Independent (RW, able to get out of the home with family driving)             Mobility Comments: uses RW 24/7, reports community ambulation for limited distances, always with family       Hand Dominance        Extremity/Trunk Assessment   Upper Extremity Assessment Upper Extremity Assessment: Overall WFL for tasks assessed    Lower Extremity Assessment Lower Extremity Assessment: Overall WFL for tasks assessed       Communication    Communication: No difficulties  Cognition Arousal/Alertness: Awake/alert Behavior During Therapy: WFL for tasks assessed/performed Overall Cognitive Status: Within Functional Limits for tasks assessed                                          General Comments General comments (skin integrity, edema, etc.): BP: supine 147/51, sitting 137/99, standing 118/70, seated post ambulation 119/80.  No orthostatic symptoms t/o session - frequently checking on this    Exercises     Assessment/Plan    PT Assessment Patient needs continued PT services  PT Problem List Decreased activity tolerance;Decreased safety awareness;Decreased mobility;Decreased strength;Decreased range of motion;Decreased balance       PT Treatment Interventions DME instruction;Gait training;Stair training;Functional mobility training;Therapeutic activities;Therapeutic exercise;Balance training;Neuromuscular re-education;Patient/family education    PT Goals (Current goals can be found in the Care Plan section)  Acute Rehab PT Goals Patient Stated Goal: Go home PT Goal Formulation: With patient Time For Goal Achievement: 12/11/21 Potential to Achieve Goals: Good    Frequency Min 2X/week     Co-evaluation               AM-PAC PT "6 Clicks" Mobility  Outcome Measure Help needed turning from your back to your side while in a flat bed without using bedrails?: None Help needed moving from lying on your back to sitting on the side of a flat bed without using bedrails?: A Little Help needed moving to and from a bed to a chair (including a wheelchair)?: A Little Help needed standing up from a chair using your arms (e.g., wheelchair or bedside chair)?: A Little Help needed to walk in hospital room?: A Little Help needed climbing 3-5 steps with a railing? : A Little 6 Click Score: 19    End of Session Equipment Utilized During Treatment: Gait belt Activity Tolerance: Patient tolerated treatment well  (nausea) Patient left: with chair alarm set;with call bell/phone within reach;with family/visitor present Nurse Communication: Mobility status (normal orthostatic BPs) PT Visit Diagnosis: Muscle weakness (generalized) (M62.81);Difficulty in walking, not elsewhere classified (R26.2)    Time: 1610-9604 PT Time Calculation (min) (ACUTE ONLY): 29 min   Charges:   PT Evaluation $PT Eval Low Complexity: 1 Low PT Treatments $Therapeutic Activity: 8-22 mins        Kreg Shropshire, DPT 11/27/2021, 1:45 PM

## 2021-11-28 DIAGNOSIS — J40 Bronchitis, not specified as acute or chronic: Secondary | ICD-10-CM

## 2021-11-28 DIAGNOSIS — I10 Essential (primary) hypertension: Secondary | ICD-10-CM

## 2021-11-28 DIAGNOSIS — R531 Weakness: Secondary | ICD-10-CM

## 2021-11-28 LAB — PLATELET COUNT: Platelets: 92 10*3/uL — ABNORMAL LOW (ref 150–400)

## 2021-11-28 LAB — GLUCOSE, CAPILLARY
Glucose-Capillary: 93 mg/dL (ref 70–99)
Glucose-Capillary: 127 mg/dL — ABNORMAL HIGH (ref 70–99)

## 2021-11-28 LAB — BASIC METABOLIC PANEL
Anion gap: 7 (ref 5–15)
BUN: 20 mg/dL (ref 8–23)
CO2: 24 mmol/L (ref 22–32)
Calcium: 8.2 mg/dL — ABNORMAL LOW (ref 8.9–10.3)
Chloride: 97 mmol/L — ABNORMAL LOW (ref 98–111)
Creatinine, Ser: 0.95 mg/dL (ref 0.44–1.00)
GFR, Estimated: 56 mL/min — ABNORMAL LOW (ref >60)
Glucose, Bld: 87 mg/dL (ref 70–99)
Potassium: 4.1 mmol/L (ref 3.5–5.1)
Sodium: 128 mmol/L — ABNORMAL LOW (ref 135–145)

## 2021-11-28 LAB — HEPATIC FUNCTION PANEL
ALT: 11 U/L (ref 0–44)
AST: 17 U/L (ref 15–41)
Albumin: 2.9 g/dL — ABNORMAL LOW (ref 3.5–5.0)
Alkaline Phosphatase: 82 U/L (ref 38–126)
Bilirubin, Direct: <0.1 mg/dL (ref 0.0–0.2)
Total Bilirubin: 0.4 mg/dL (ref 0.3–1.2)
Total Protein: 5.2 g/dL — ABNORMAL LOW (ref 6.5–8.1)

## 2021-11-28 MED ORDER — PANTOPRAZOLE SODIUM 40 MG PO TBEC: 40.0000 mg | DELAYED_RELEASE_TABLET | Freq: Two times a day (BID) | ORAL | 1 refills | Status: DC

## 2021-11-28 MED ORDER — GUAIFENESIN ER 600 MG PO TB12: 600.0000 mg | ORAL_TABLET | Freq: Two times a day (BID) | ORAL | 0 refills | Status: AC

## 2021-11-28 MED ORDER — BENZONATATE 100 MG PO CAPS: 100.0000 mg | ORAL_CAPSULE | Freq: Three times a day (TID) | ORAL | 0 refills | Status: DC

## 2021-11-28 MED ORDER — SODIUM CHLORIDE 1 G PO TABS
1.0000 g | ORAL_TABLET | Freq: Two times a day (BID) | ORAL | Status: DC
  Administered 2021-11-28: 1 g via ORAL
  Filled 2021-11-28 (×3): qty 1

## 2021-11-28 MED ORDER — SODIUM CHLORIDE 1 G PO TABS: 1.0000 g | ORAL_TABLET | Freq: Two times a day (BID) | ORAL | 0 refills | Status: AC

## 2021-11-28 MED ORDER — LISINOPRIL 20 MG PO TABS: 20.0000 mg | ORAL_TABLET | Freq: Every day | ORAL | 1 refills | Status: DC

## 2021-11-28 NOTE — Progress Notes (Signed)
Patient discharged to home with family with no complications. Discharge instructions given to patient and family with no complications or questions. Prescriptions sent to [facility] per family request. No complications with prescriptions. PIVX1 REMOVED and catheter intact. VSS at discharge.

## 2021-11-28 NOTE — Discharge Instructions (Signed)
Patient will need CBC and BMP check in 4 to 5 days.

## 2021-11-28 NOTE — Discharge Summary (Signed)
Jasmine Webster at [place] NAME: Jasmine Webster    MR#:  [id number]  DATE OF BIRTH:  06-Jan-1926  DATE OF ADMISSION:  11/26/2021 ADMITTING PHYSICIAN: Amy N Cox, DO  DATE OF DISCHARGE: 11/28/2021  PRIMARY CARE PHYSICIAN: Jerrol Banana., MD    ADMISSION DIAGNOSIS:  Hyponatremia [E87.1] Weakness [R53.1]  DISCHARGE DIAGNOSIS:  generalized weakness with Hyponatremia Acute Bronchitis  SECONDARY DIAGNOSIS:   Past Medical History:  Diagnosis Date   Arthritis    Cataract    cataract removal bilaterally approx 20 years ago   Chronic back pain    Coronary artery disease    COVID-19 virus infection 12/2020   Diabetes mellitus without complication ([place])    Heart murmur    Hyperlipidemia    Hypertension     HOSPITAL COURSE:   Jasmine Webster is a 88 y.o. female with medical history significant for hypertension, insulin-dependent diabetes mellitus, anxiety, insomnia, hyperlipidemia, GERD, neuropathy, who presents emergency department for chief concerns of fatigue and weakness. She reports she has had a cough that has been ongoing since December 25th, she reports it is nonproductive or producing of white phlegm. She denies fever, known sick contact, diarrhea, swelling in legs. She does not noticed any weight gain.    Hyponatremia in the setting of viral URI/Bronchits/mild PNA Suspect SIADH -- patient came in with generalized weakness, poor PO intake and dry cough -- will give IV fluids -- patient encouraged to eat small meals -- sodium 125--- 129-- IV fluids--128.  -- hold hydrochlorothiazide -pt eating much better --started salt tabs for 3 days. --f/u BMP in 4-5 days with Dr Rosanna Randy -- overall patient clinically feels better. No fever no shortness of breath. Sats stable. MAP stable.   viral URI with possible mild pneumonia superimposed -- patient was started on oral doxycycline and cefdinir as outpatient which will continue -- Pro  calcitonin times two negative -- white count 10.7 -- no fever -- PRN cough meds   type II diabetes with neuropathy -- sliding scale insulin -- continue gabapentin --resume Insulin home dose and cont check sugars    Hypertension -- will continue home meds Quinapril for now. Keep log of bp at home and d/w PCP dr Rosanna Randy -- hold hydrochlorothiazide   CAD. History of aortic stenosis -- no chest pain.    Gerd -- PPI BID   low platelet count --? Viral , ?cefdinir -- plt count 311--103--92 --D/w pt to get it recheck  in 4-5 days as outpt to ensure it is improving --consider Hematology consult if not improvement as out pt --d/c cefdinir and lovenox  Above discharge plan was d/ w pt and dter [name] (on the phone)       CONSULTS OBTAINED:    DRUG ALLERGIES:   Allergies  Allergen Reactions   Etodolac Nausea Only, Other (See Comments) and Anxiety    Dizziness, too   Naproxen Other (See Comments) and Anxiety    Gave her an ulcer after taking it for years   Statins Other (See Comments)    No energy and myalgias   Latex Rash   Terbinafine Other (See Comments) and Rash    "Maybe made me nervous"   Terbinafine Hcl Rash    DISCHARGE MEDICATIONS:   Allergies as of 11/28/2021       Reactions   Etodolac Nausea Only, Other (See Comments), Anxiety   Dizziness, too   Naproxen Other (See Comments), Anxiety   Gave her an  ulcer after taking it for years   Statins Other (See Comments)   No energy and myalgias   Latex Rash   Terbinafine Other (See Comments), Rash   "Maybe made me nervous"   Terbinafine Hcl Rash        Medication List     STOP taking these medications    cefdinir 300 MG capsule Commonly known as: OMNICEF   furosemide 20 MG tablet Commonly known as: LASIX   omeprazole 20 MG capsule Commonly known as: PRILOSEC Replaced by: pantoprazole 40 MG tablet   potassium chloride 10 MEQ tablet Commonly known as: KLOR-CON   quinapril-hydrochlorothiazide  20-12.5 MG tablet Commonly known as: ACCURETIC       TAKE these medications    ALPRAZolam 0.5 MG tablet Commonly known as: XANAX TAKE 1 TABLET BY MOUTH EVERY DAY AT BEDTIME AS NEEDED   aspirin EC 81 MG tablet Take 81 mg by mouth daily.   benzonatate 100 MG capsule Commonly known as: TESSALON Take 1 capsule (100 mg total) by mouth 3 (three) times daily.   doxycycline 100 MG capsule Commonly known as: VIBRAMYCIN Take 1 capsule (100 mg total) by mouth 2 (two) times daily for 7 days.   DRY EYES OP Apply 1 drop to eye daily.   FISH OIL PO Take by mouth daily.   guaiFENesin 600 MG 12 hr tablet Commonly known as: MUCINEX Take 1 tablet (600 mg total) by mouth 2 (two) times daily for 7 days.   Insulin Pen Needle 31G X 8 MM Misc Check sugar 2 times daily, DX E11.9 (needs ultra fine pen needles)   B-D ULTRAFINE III SHORT PEN 31G X 8 MM Misc Generic drug: Insulin Pen Needle CHECK SUGAR 2 TIMES DAILY, DX E11.9 (NEEDS ULTRA FINE PEN NEEDLES)   Lantus SoloStar 100 UNIT/ML Solostar Pen Generic drug: insulin glargine INJECT 25 UNITS SUBCUTANEOUSLY What changed:  how much to take how to take this when to take this additional instructions   lisinopril 20 MG tablet Commonly known as: ZESTRIL Take 1 tablet (20 mg total) by mouth daily. Start taking on: November 29, 2021   Magnesium 400 MG Caps Take 400 mg by mouth daily.   nitroGLYCERIN 0.4 MG SL tablet Commonly known as: NITROSTAT Place 1 tablet (0.4 mg total) under the tongue every 5 (five) minutes as needed for chest pain.   ondansetron 4 MG tablet Commonly known as: ZOFRAN TAKE 1 TABLET BY MOUTH EVERY 8 HOURS AS NEEDED FOR NAUSEA AND VOMITING   ONE TOUCH ULTRA TEST test strip Generic drug: glucose blood Check sugar twice daily.   OneTouch Ultra test strip Generic drug: glucose blood CHECK SUGAR TWICE A DAY   onetouch ultrasoft lancets Check sugar twice daily DX E11.9   pantoprazole 40 MG tablet Commonly  known as: PROTONIX Take 1 tablet (40 mg total) by mouth 2 (two) times daily before a meal. Replaces: omeprazole 20 MG capsule   PreserVision AREDS Caps Take 1 capsule by mouth 2 (two) times daily.   sodium chloride 1 g tablet Take 1 tablet (1 g total) by mouth 2 (two) times daily with a meal for 3 days.        If you experience worsening of your admission symptoms, develop shortness of breath, life threatening emergency, suicidal or homicidal thoughts you must seek medical attention immediately by calling 911 or calling your MD immediately  if symptoms less severe.  You Must read complete instructions/literature along with all the possible adverse reactions/side effects for  all the Medicines you take and that have been prescribed to you. Take any new Medicines after you have completely understood and accept all the possible adverse reactions/side effects.   Please note  You were cared for by a hospitalist during your hospital stay. If you have any questions about your discharge medications or the care you received while you were in the hospital after you are discharged, you can call the unit and asked to speak with the hospitalist on call if the hospitalist that took care of you is not available. Once you are discharged, your primary care physician will handle any further medical issues. Please note that NO REFILLS for any discharge medications will be authorized once you are discharged, as it is imperative that you return to your primary care physician (or establish a relationship with a primary care physician if you do not have one) for your aftercare needs so that they can reassess your need for medications and monitor your lab values. Today   SUBJECTIVE    I feels much better today. Have some cough. Eating better No fever VITAL SIGNS:  Blood pressure (!) 147/33, pulse 68, temperature 98 F (36.7 C), temperature source Oral, resp. rate 20, height 5\' 4"  (1.626 m), weight 72.6 kg, SpO2  100 %.  I/O:   Intake/Output Summary (Last 24 hours) at 11/28/2021 1448 Last data filed at 11/28/2021 1412 Gross per 24 hour  Intake 1450 ml  Output --  Net 1450 ml    PHYSICAL EXAMINATION:  GENERAL:  88 y.o.-year-old patient lying in the bed with no acute distress.  LUNGS: Normal breath sounds bilaterally, no wheezing, rales,rhonchi or crepitation. No use of accessory muscles of respiration.  CARDIOVASCULAR: S1, S2 normal. No murmurs, rubs, or gallops.  ABDOMEN: Soft, non-tender, non-distended. Bowel sounds present. No organomegaly or mass.  EXTREMITIES: No pedal edema, cyanosis, or clubbing.  NEUROLOGIC: non-focal PSYCHIATRIC:  patient is alert and oriented x 3.  SKIN: No obvious rash, lesion, or ulcer.   DATA REVIEW:   CBC  Recent Labs  Lab 11/27/21 0423 11/28/21 0429  WBC 10.7*  --   HGB 9.7*  --   HCT 29.6*  --   PLT 103* 92*    Chemistries  Recent Labs  Lab 11/26/21 1817 11/27/21 0423 11/28/21 0429  NA  --    < > 128*  K  --    < > 4.1  CL  --    < > 97*  CO2  --    < > 24  GLUCOSE  --    < > 87  BUN  --    < > 20  CREATININE  --    < > 0.95  CALCIUM  --    < > 8.2*  MG 2.0  --   --   AST  --   --  17  ALT  --   --  11  ALKPHOS  --   --  82  BILITOT  --   --  0.4   < > = values in this interval not displayed.    Microbiology Results   Recent Results (from the past 240 hour(s))  Covid-19, Flu A+B ([facility])     Status: None   Collection Time: 11/24/21  1:19 PM   Specimen: Nasopharyngeal   Naso  Result Value Ref Range Status   SARS-CoV-2, NAA Not Detected Not Detected Final   Influenza A, NAA Not Detected Not Detected Final   Influenza B, NAA Not  Detected Not Detected Final   Test Information: Comment  Final    Comment: This nucleic acid amplification test was developed and its performance characteristics determined by [employer]. Nucleic acid amplification tests include RT-PCR and TMA. This test has not been FDA cleared or approved.  This test has been authorized by FDA under an Emergency Use Authorization (EUA). This test is only authorized for the duration of time the declaration that circumstances exist justifying the authorization of the emergency use of in vitro diagnostic tests for detection of SARS-CoV-2 virus and/or diagnosis of COVID-19 infection under section 564(b)(1) of the Act, 21 U.S.C. 008QPY-1(P) (1), unless the authorization is terminated or revoked sooner. When diagnostic testing is negative, the possibility of a false negative result should be considered in the context of a patient's recent exposures and the presence of clinical signs and symptoms consistent with COVID-19. An individual without symptoms of COVID-19 and who is not shedding SARS-CoV-2 virus wo uld expect to have a negative (not detected) result in this assay.   Resp Panel by RT-PCR (Flu A&B, Covid) Nasopharyngeal Swab     Status: None   Collection Time: 11/26/21  6:17 PM   Specimen: Nasopharyngeal Swab; Nasopharyngeal(NP) swabs in vial transport medium  Result Value Ref Range Status   SARS Coronavirus 2 by RT PCR NEGATIVE NEGATIVE Final    Comment: (NOTE) SARS-CoV-2 target nucleic acids are NOT DETECTED.  The SARS-CoV-2 RNA is generally detectable in upper respiratory specimens during the acute phase of infection. The lowest concentration of SARS-CoV-2 viral copies this assay can detect is 138 copies/mL. A negative result does not preclude SARS-Cov-2 infection and should not be used as the sole basis for treatment or other patient management decisions. A negative result may occur with  improper specimen collection/handling, submission of specimen other than nasopharyngeal swab, presence of viral mutation(s) within the areas targeted by this assay, and inadequate number of viral copies(<138 copies/mL). A negative result must be combined with clinical observations, patient history, and epidemiological information. The expected  result is Negative.  Fact Sheet for Patients:  EntrepreneurPulse.com.au  Fact Sheet for Healthcare Providers:  IncredibleEmployment.be  This test is no t yet approved or cleared by the [place] FDA and  has been authorized for detection and/or diagnosis of SARS-CoV-2 by FDA under an Emergency Use Authorization (EUA). This EUA will remain  in effect (meaning this test can be used) for the duration of the COVID-19 declaration under Section 564(b)(1) of the Act, 21 U.S.C.section 360bbb-3(b)(1), unless the authorization is terminated  or revoked sooner.       Influenza A by PCR NEGATIVE NEGATIVE Final   Influenza B by PCR NEGATIVE NEGATIVE Final    Comment: (NOTE) The Xpert Xpress SARS-CoV-2/FLU/RSV plus assay is intended as an aid in the diagnosis of influenza from Nasopharyngeal swab specimens and should not be used as a sole basis for treatment. Nasal washings and aspirates are unacceptable for Xpert Xpress SARS-CoV-2/FLU/RSV testing.  Fact Sheet for Patients: EntrepreneurPulse.com.au  Fact Sheet for Healthcare Providers: IncredibleEmployment.be  This test is not yet approved or cleared by the [place] FDA and has been authorized for detection and/or diagnosis of SARS-CoV-2 by FDA under an Emergency Use Authorization (EUA). This EUA will remain in effect (meaning this test can be used) for the duration of the COVID-19 declaration under Section 564(b)(1) of the Act, 21 U.S.C. section 360bbb-3(b)(1), unless the authorization is terminated or revoked.  Performed at [facility], [place], [place]  [address]     RADIOLOGY:  No results found.   CODE STATUS:     Code Status Orders  (From admission, onward)           Start     Ordered   11/26/21 1939  Do not attempt resuscitation (DNR)  Continuous       Question Answer Comment  In the event of cardiac or  respiratory ARREST Do not call a code blue   In the event of cardiac or respiratory ARREST Do not perform Intubation, CPR, defibrillation or ACLS   In the event of cardiac or respiratory ARREST Use medication by any route, position, wound care, and other measures to relive pain and suffering. May use oxygen, suction and manual treatment of airway obstruction as needed for comfort.      11/26/21 1938           Code Status History     Date Active Date Inactive Code Status Order ID Comments User Context   11/26/2021 1731 11/26/2021 1938 Full Code [id number]  Criss Alvine, DO ED   04/18/2017 1531 04/18/2017 2013 Full Code [id number]  Schuyler Amor, MD ED      Advance Directive Documentation    Flowsheet Row Most Recent Value  Type of Advance Directive Healthcare Power of Attorney, Living will, Out of facility DNR (pink MOST or yellow form)  Pre-existing out of facility DNR order (yellow form or pink MOST form) --  "MOST" Form in Place? --        TOTAL TIME TAKING CARE OF THIS PATIENT: 40 minutes.    Fritzi Mandes M.D  [facility]    CC: Primary care physician; Jerrol Banana., MD

## 2021-11-28 NOTE — Plan of Care (Signed)
Problem: Clinical Measurements: °Goal: Diagnostic test results will improve °Outcome: Completed/Met °Goal: Respiratory complications will improve °Outcome: Completed/Met °Goal: Cardiovascular complication will be avoided °Outcome: Completed/Met °  °Problem: Activity: °Goal: Risk for activity intolerance will decrease °Outcome: Completed/Met °  °Problem: Safety: °Goal: Ability to remain free from injury will improve °Outcome: Completed/Met °  °Problem: Acute Rehab PT Goals(only PT should resolve) °Goal: Pt Will Perform Standing Balance Or Pre-Gait °Outcome: Completed/Met °Goal: Pt Will Ambulate °Outcome: Completed/Met °Goal: Pt Will Go Up/Down Stairs °Outcome: Completed/Met °  °Problem: Acute Rehab OT Goals (only OT should resolve) °Goal: Pt. Will Perform Grooming °Outcome: Completed/Met °Goal: Pt. Will Perform Lower Body Dressing °Outcome: Completed/Met °Goal: Pt. Will Transfer To Toilet °Outcome: Completed/Met °Goal: Pt. Will Perform Toileting-Clothing Manipulation °Outcome: Completed/Met °  °

## 2021-11-28 NOTE — Progress Notes (Signed)
Occupational Therapy Treatment Patient Details Name: Jasmine Webster MRN: [id number] DOB: 1926/10/11 Today's Date: 11/28/2021   History of present illness 88 y.o. female with diabetes, hypertension, aortic stenosis who comes in with concerns for pneumonia.  Patient was diagnosed with pneumonia 2 days ago at urgent care.  Has had continued cough, fatigue, nausea for 10 days.  Now here with progressive weakness and 2 near falls.   OT comments  Upon entering the room, pt supine in bed with no c/o pain and reports feeling much better. Pt performed bed mobility without assistance. Pt standing with RW and supervision and ambulating to bathroom for toileting need. Pt able to perform clothing management, hygiene, and transfer with supervision for safety. Pt standing at sink to wash hands and brush teeth with supervision and then pt ambulates 200' with RW without LOB. Pt returning to bed secondary to fatigue at end of session. All needs within reach and family present in the room. Pt continues to benefit from OT intervention.    Recommendations for follow up therapy are one component of a multi-disciplinary discharge planning process, led by the attending physician.  Recommendations may be updated based on patient status, additional functional criteria and insurance authorization.    Follow Up Recommendations  Home health OT    Assistance Recommended at Discharge Intermittent Supervision/Assistance  Patient can return home with the following  A little help with walking and/or transfers;A little help with bathing/dressing/bathroom;Help with stairs or ramp for entrance   Equipment Recommendations  None recommended by OT       Precautions / Restrictions Precautions Precautions: Fall Restrictions Weight Bearing Restrictions: No       Mobility Bed Mobility Overal bed mobility: Modified Independent             General bed mobility comments: increased time and effort    Transfers Overall  transfer level: Needs assistance Equipment used: Rolling walker (2 wheels) Transfers: Sit to/from Stand Sit to Stand: Supervision                 Balance Overall balance assessment: Needs assistance Sitting-balance support: Feet supported Sitting balance-Leahy Scale: Good     Standing balance support: Reliant on assistive device for balance;During functional activity Standing balance-Leahy Scale: Fair                             ADL either performed or assessed with clinical judgement   ADL Overall ADL's : Needs assistance/impaired     Grooming: Wash/dry hands;Wash/dry face;Oral care;Standing;Supervision/safety                   Toilet Transfer: Supervision/safety;Rolling walker (2 wheels)   Toileting- Water quality scientist and Hygiene: Supervision/safety;Sit to/from stand       Functional mobility during ADLs: Supervision/safety;Rolling walker (2 wheels)      Extremity/Trunk Assessment Upper Extremity Assessment Upper Extremity Assessment: Overall WFL for tasks assessed   Lower Extremity Assessment Lower Extremity Assessment: Overall WFL for tasks assessed        Vision Patient Visual Report: No change from baseline            Cognition Arousal/Alertness: Awake/alert Behavior During Therapy: WFL for tasks assessed/performed Overall Cognitive Status: Within Functional Limits for tasks assessed  Pertinent Vitals/ Pain       Pain Assessment: No/denies pain         Frequency  Min 2X/week        Progress Toward Goals  OT Goals(current goals can now be found in the care plan section)  Progress towards OT goals: Progressing toward goals  Acute Rehab OT Goals Patient Stated Goal: to go home OT Goal Formulation: With patient/family Time For Goal Achievement: 12/11/21 Potential to Achieve Goals: Good  Plan Discharge plan remains appropriate;Frequency  remains appropriate       AM-PAC OT "6 Clicks" Daily Activity     Outcome Measure   Help from another person eating meals?: None Help from another person taking care of personal grooming?: None Help from another person toileting, which includes using toliet, bedpan, or urinal?: A Little Help from another person bathing (including washing, rinsing, drying)?: A Little Help from another person to put on and taking off regular upper body clothing?: None Help from another person to put on and taking off regular lower body clothing?: A Little 6 Click Score: 21    End of Session Equipment Utilized During Treatment: Rolling walker (2 wheels)  OT Visit Diagnosis: Unsteadiness on feet (R26.81);Muscle weakness (generalized) (M62.81);Repeated falls (R29.6)   Activity Tolerance Patient tolerated treatment well   Patient Left with call bell/phone within reach;in bed;with bed alarm set;with family/visitor present   Nurse Communication Mobility status        Time: 9476-5465 OT Time Calculation (min): 23 min  Charges: OT General Charges $OT Visit: 1 Visit OT Treatments $Self Care/Home Management : 8-22 mins $Therapeutic Activity: 8-22 mins  Darleen Crocker, MS, OTR/L , CBIS ascom [phone]  11/28/21, 12:28 PM

## 2021-11-29 ENCOUNTER — Telehealth: Payer: Self-pay

## 2021-11-29 DIAGNOSIS — Z8709 Personal history of other diseases of the respiratory system: Secondary | ICD-10-CM | POA: Diagnosis not present

## 2021-11-29 DIAGNOSIS — Z09 Encounter for follow-up examination after completed treatment for conditions other than malignant neoplasm: Secondary | ICD-10-CM | POA: Diagnosis not present

## 2021-11-29 DIAGNOSIS — Z8701 Personal history of pneumonia (recurrent): Secondary | ICD-10-CM | POA: Diagnosis not present

## 2021-11-29 NOTE — Telephone Encounter (Signed)
Transition Care Management Follow-up Telephone Call Date of discharge and from where: TCM [facility] 11-28-21 Dx: generalized weakness with hyponatremia How have you been since you were released from the hospital? Not feeling the best but better than before  Any questions or concerns? No  Items Reviewed: Did the pt receive and understand the discharge instructions provided? Yes  Medications obtained and verified? Yes  Other? No  Any new allergies since your discharge? No  Dietary orders reviewed? Yes Do you have support at home? Yes   Home Care and Equipment/Supplies: Were home health services ordered? no If so, what is the name of the agency? na  Has the agency set up a time to come to the patient's home? not applicable Were any new equipment or medical supplies ordered?  No What is the name of the medical supply agency? na Were you able to get the supplies/equipment? not applicable Do you have any questions related to the use of the equipment or supplies? No  Functional Questionnaire: (I = Independent and D = Dependent) ADLs: I  Bathing/Dressing- I  Meal Prep- I  Eating- I  Maintaining continence- I  Transferring/Ambulation- I  Managing Meds- I  Follow up appointments reviewed:  PCP Hospital f/u appt confirmed? Yes  Scheduled to see Dr Ky Barban  on 12-04-21 @ [facility] f/u appt confirmed? No  . Are transportation arrangements needed? No  If their condition worsens, is the pt aware to call PCP or go to the Emergency Dept.? Yes Was the patient provided with contact information for the PCP's office or ED? Yes Was to pt encouraged to call back with questions or concerns? Yes

## 2021-12-04 ENCOUNTER — Other Ambulatory Visit: Payer: Self-pay

## 2021-12-04 ENCOUNTER — Encounter: Payer: Self-pay | Admitting: Family Medicine

## 2021-12-04 ENCOUNTER — Ambulatory Visit (INDEPENDENT_AMBULATORY_CARE_PROVIDER_SITE_OTHER): Payer: PPO | Admitting: Family Medicine

## 2021-12-04 VITALS — BP 160/74 | HR 78 | Temp 98.3°F | Resp 18 | Ht 64.0 in | Wt 154.0 lb

## 2021-12-04 DIAGNOSIS — E1159 Type 2 diabetes mellitus with other circulatory complications: Secondary | ICD-10-CM | POA: Diagnosis not present

## 2021-12-04 DIAGNOSIS — I10 Essential (primary) hypertension: Secondary | ICD-10-CM | POA: Diagnosis not present

## 2021-12-04 DIAGNOSIS — R5381 Other malaise: Secondary | ICD-10-CM

## 2021-12-04 DIAGNOSIS — R5383 Other fatigue: Secondary | ICD-10-CM

## 2021-12-04 DIAGNOSIS — D649 Anemia, unspecified: Secondary | ICD-10-CM

## 2021-12-04 DIAGNOSIS — E871 Hypo-osmolality and hyponatremia: Secondary | ICD-10-CM | POA: Diagnosis not present

## 2021-12-04 NOTE — Progress Notes (Signed)
° °  SUBJECTIVE:   CHIEF COMPLAINT / HPI:   HOSPITAL FOLLOW UP Hospital/facility: [facility] 1/9-1/11 Diagnosis: hyponatremia 2/2 viral illness vs PNA improved s/p IVF Procedures/tests:  Consultants: none New medications:  - hold HCTZ, furosemide - salt tabs x3 days - doxycycline x 7 days. Discharge instructions:   Status: better but still weak and tired.  - PT recommended HHPT, declines. - checking BP at home, 140s SBP.  - CBGs 127 this morning. Lowest 90 since discharge. Lantus 20u. - breathing ok. Coughing improving. Non productive. Finished antibiotics.  OBJECTIVE:   BP (!) 160/74 (BP Location: Left Arm, Patient Position: Sitting, Cuff Size: Normal)    Pulse 78    Temp 98.3 F (36.8 C) (Temporal)    Resp 18    Ht 5\' 4"  (1.626 m)    Wt 154 lb (69.9 kg)    SpO2 97%    BMI 26.43 kg/m   Gen: elderly, well appearing, in NAD Card: RRR, systolic murmur Lungs: CTAB. No wheeze, rales, rhonchi. Comfortable WOB on RA. Ext: WWP, no edema   ASSESSMENT/PLAN:   Benign essential HTN Slightly elevated today but at goal for age at home and with recent lows in hospital, will continue to withhold diuretics. No LE edema on exam today. Obtaining labs.   Diabetes mellitus type II, controlled ([place]) CBGs below goal, may be contributing to generalized weakness and fatigue. Decrease lantus to 18u. Keep log of blood sugars and bring to appt next month.   Hyponatremia Recheck labs.   Malaise and fatigue Likely multifactorial as above. Declines HHPT. F/u next month.   Pneumonia Improved s/p abx.  Myles Gip, DO

## 2021-12-04 NOTE — Assessment & Plan Note (Signed)
Recheck labs 

## 2021-12-04 NOTE — Assessment & Plan Note (Signed)
CBGs below goal, may be contributing to generalized weakness and fatigue. Decrease lantus to 18u. Keep log of blood sugars and bring to appt next month.

## 2021-12-04 NOTE — Assessment & Plan Note (Signed)
Slightly elevated today but at goal for age at home and with recent lows in hospital, will continue to withhold diuretics. No LE edema on exam today. Obtaining labs.

## 2021-12-04 NOTE — Assessment & Plan Note (Signed)
Likely multifactorial as above. Declines HHPT. F/u next month.

## 2021-12-04 NOTE — Patient Instructions (Signed)
It was great to see you!  Our plans for today:  - Decrease lantus to 18u. Keep a log of your blood sugars and bring with you to your next appointment.  - We are checking some labs today, we will release these results to your MyChart. - Let us know if you change your mind about physical therapy.   Take care and seek immediate care sooner if you develop any concerns.   Dr. Ky Barban

## 2021-12-05 LAB — BASIC METABOLIC PANEL
BUN/Creatinine Ratio: 23 (ref 12–28)
BUN: 25 mg/dL (ref 10–36)
CO2: 21 mmol/L (ref 20–29)
Calcium: 9.5 mg/dL (ref 8.7–10.3)
Chloride: 102 mmol/L (ref 96–106)
Creatinine, Ser: 1.09 mg/dL — ABNORMAL HIGH (ref 0.57–1.00)
Glucose: 146 mg/dL — ABNORMAL HIGH (ref 70–99)
Potassium: 5 mmol/L (ref 3.5–5.2)
Sodium: 137 mmol/L (ref 134–144)
eGFR: 47 mL/min/{1.73_m2} — ABNORMAL LOW (ref >59)

## 2021-12-05 LAB — CBC WITH DIFFERENTIAL/PLATELET
Basophils Absolute: 0.1 10*3/uL (ref 0.0–0.2)
Basos: 1 %
EOS (ABSOLUTE): 0.1 10*3/uL (ref 0.0–0.4)
Eos: 1 %
Hematocrit: 30.9 % — ABNORMAL LOW (ref 34.0–46.6)
Hemoglobin: 10 g/dL — ABNORMAL LOW (ref 11.1–15.9)
Immature Grans (Abs): 0 10*3/uL (ref 0.0–0.1)
Immature Granulocytes: 0 %
Lymphocytes Absolute: 2.5 10*3/uL (ref 0.7–3.1)
Lymphs: 32 %
MCH: 30.6 pg (ref 26.6–33.0)
MCHC: 32.4 g/dL (ref 31.5–35.7)
MCV: 95 fL (ref 79–97)
Monocytes Absolute: 0.8 10*3/uL (ref 0.1–0.9)
Monocytes: 11 %
Neutrophils Absolute: 4.2 10*3/uL (ref 1.4–7.0)
Neutrophils: 55 %
Platelets: 120 10*3/uL — ABNORMAL LOW (ref 150–450)
RBC: 3.27 x10E6/uL — ABNORMAL LOW (ref 3.77–5.28)
RDW: 12.3 % (ref 11.7–15.4)
WBC: 7.6 10*3/uL (ref 3.4–10.8)

## 2021-12-05 NOTE — Addendum Note (Signed)
Addended by: Myles Gip on: 12/05/2021 09:54 AM   Modules accepted: Orders

## 2021-12-08 LAB — SPECIMEN STATUS REPORT

## 2021-12-08 LAB — IRON AND TIBC
Iron Saturation: 11 % — ABNORMAL LOW (ref 15–55)
Iron: 36 ug/dL (ref 27–139)
Total Iron Binding Capacity: 327 ug/dL (ref 250–450)
UIBC: 291 ug/dL (ref 118–369)

## 2021-12-08 LAB — FERRITIN: Ferritin: 31 ng/mL (ref 15–150)

## 2021-12-08 LAB — VITAMIN B12: Vitamin B-12: 716 pg/mL (ref 232–1245)

## 2021-12-08 LAB — FOLATE: Folate: >20 ng/mL (ref >3.0)

## 2021-12-10 ENCOUNTER — Other Ambulatory Visit: Payer: Self-pay | Admitting: Family Medicine

## 2021-12-11 ENCOUNTER — Encounter: Payer: Self-pay | Admitting: Family Medicine

## 2021-12-13 ENCOUNTER — Ambulatory Visit: Payer: PPO | Admitting: Podiatry

## 2021-12-13 ENCOUNTER — Other Ambulatory Visit: Payer: Self-pay | Admitting: Family Medicine

## 2021-12-13 MED ORDER — LISINOPRIL 20 MG PO TABS
20.0000 mg | ORAL_TABLET | Freq: Every day | ORAL | 1 refills | Status: DC
Start: 2021-12-13 — End: 2022-05-24

## 2021-12-20 ENCOUNTER — Other Ambulatory Visit: Payer: Self-pay | Admitting: Family Medicine

## 2021-12-20 ENCOUNTER — Encounter: Payer: Self-pay | Admitting: Podiatry

## 2021-12-20 ENCOUNTER — Other Ambulatory Visit: Payer: Self-pay

## 2021-12-20 ENCOUNTER — Ambulatory Visit: Payer: PPO | Admitting: Podiatry

## 2021-12-20 DIAGNOSIS — M79675 Pain in left toe(s): Secondary | ICD-10-CM | POA: Diagnosis not present

## 2021-12-20 DIAGNOSIS — E119 Type 2 diabetes mellitus without complications: Secondary | ICD-10-CM | POA: Diagnosis not present

## 2021-12-20 DIAGNOSIS — M79674 Pain in right toe(s): Secondary | ICD-10-CM | POA: Diagnosis not present

## 2021-12-20 DIAGNOSIS — B351 Tinea unguium: Secondary | ICD-10-CM

## 2021-12-20 DIAGNOSIS — F419 Anxiety disorder, unspecified: Secondary | ICD-10-CM

## 2021-12-20 NOTE — Telephone Encounter (Signed)
Requested medication (s) are due for refill today: yes  Requested medication (s) are on the active medication list: yes  Last refill:  08/23/21 #30 3 refills  Future visit scheduled: yes in 3 weeks  Notes to clinic:  not delegated per protocol . Do you want to refill Rx?     Requested Prescriptions  Pending Prescriptions Disp Refills   ALPRAZolam (XANAX) 0.5 MG tablet [Pharmacy Med Name: ALPRAZOLAM 0.5 MG TABLET] 30 tablet 3    Sig: TAKE 1 TABLET BY MOUTH EVERY DAY AT BEDTIME AS NEEDED     Not Delegated - Psychiatry: Anxiolytics/Hypnotics 2 Failed - 12/20/2021  9:18 AM      Failed - This refill cannot be delegated      Failed - Urine Drug Screen completed in last 360 days      Passed - Patient is not pregnant      Passed - Valid encounter within last 6 months    Recent Outpatient Visits           2 weeks ago Benign essential HTN   [facility] Rory Percy M, DO   4 weeks ago Viral URI with cough   [facility], Dionne Bucy, MD   3 months ago Controlled type 2 diabetes mellitus with other circulatory complication, unspecified whether long term insulin use [facility])   [facility] Jerrol Banana., MD   4 months ago Encounter for [employer] annual wellness exam   [facility] Jerrol Banana., MD   7 months ago Controlled type 2 diabetes mellitus with other circulatory complication, unspecified whether long term insulin use [facility])   [facility] Jerrol Banana., MD       Future Appointments             In 3 weeks Jerrol Banana., MD [facility]

## 2021-12-20 NOTE — Progress Notes (Signed)
This patient returns to my office for at risk foot care.  This patient requires this care by a professional since this patient will be at risk due to having diabetes  This patient is unable to cut nails herself since the patient cannot reach her nails.These nails are painful walking and wearing shoes.  This patient presents for at risk foot care today.  General Appearance  Alert, conversant and in no acute stress.  Vascular  Dorsalis pedis and posterior tibial  pulses are weakly  palpable  bilaterally.  Capillary return is within normal limits  bilaterally. Temperature is within normal limits  bilaterally.  Neurologic  Senn-Weinstein monofilament wire test within normal limits  bilaterally. Muscle power within normal limits bilaterally.  Nails Thick disfigured discolored nails with subungual debris  from hallux to fifth toes bilaterally except nail plate left hallux.. No evidence of bacterial infection or drainage bilaterally.  Orthopedic  No limitations of motion  feet .  No crepitus or effusions noted.  HAV  B/L.  Hammer toes second  B/L.  Skin  normotropic skin with no porokeratosis noted bilaterally.  No signs of infections or ulcers noted.     Onychomycosis  Pain in right toes  Pain in left toes  Consent was obtained for treatment procedures.   Mechanical debridement of nails 1-5  bilaterally performed with a nail nipper.  Filed with dremel without incident.    Return office visit   3 months                   Told patient to return for periodic foot care and evaluation due to potential at risk complications.   Gardiner Barefoot DPM

## 2022-01-14 ENCOUNTER — Ambulatory Visit
Admission: RE | Admit: 2022-01-14 | Discharge: 2022-01-14 | Disposition: A | Discharge status: Home / Self Care | Payer: PPO | Source: Ambulatory Visit | Attending: Family Medicine | Admitting: Family Medicine

## 2022-01-14 ENCOUNTER — Ambulatory Visit (INDEPENDENT_AMBULATORY_CARE_PROVIDER_SITE_OTHER): Payer: PPO | Admitting: Family Medicine

## 2022-01-14 ENCOUNTER — Other Ambulatory Visit: Payer: Self-pay

## 2022-01-14 ENCOUNTER — Ambulatory Visit
Admission: RE | Admit: 2022-01-14 | Discharge: 2022-01-14 | Disposition: A | Discharge status: Home / Self Care | Payer: PPO | Attending: Family Medicine | Admitting: Family Medicine

## 2022-01-14 VITALS — BP 120/50 | HR 83 | Temp 98.4°F | Wt 153.0 lb

## 2022-01-14 DIAGNOSIS — I2510 Atherosclerotic heart disease of native coronary artery without angina pectoris: Secondary | ICD-10-CM

## 2022-01-14 DIAGNOSIS — I10 Essential (primary) hypertension: Secondary | ICD-10-CM | POA: Diagnosis not present

## 2022-01-14 DIAGNOSIS — E871 Hypo-osmolality and hyponatremia: Secondary | ICD-10-CM | POA: Diagnosis not present

## 2022-01-14 DIAGNOSIS — I350 Nonrheumatic aortic (valve) stenosis: Secondary | ICD-10-CM | POA: Diagnosis not present

## 2022-01-14 DIAGNOSIS — I509 Heart failure, unspecified: Secondary | ICD-10-CM

## 2022-01-14 DIAGNOSIS — J189 Pneumonia, unspecified organism: Secondary | ICD-10-CM | POA: Diagnosis not present

## 2022-01-14 DIAGNOSIS — D649 Anemia, unspecified: Secondary | ICD-10-CM

## 2022-01-14 DIAGNOSIS — I209 Angina pectoris, unspecified: Secondary | ICD-10-CM

## 2022-01-14 NOTE — Progress Notes (Signed)
[place],acting as a scribe for [name], MD.,have documented all relevant documentation on the behalf of [name], MD,as directed by  [name], MD while in the presence of [name], MD.     Established patient visit   Patient: Jasmine Webster   DOB: 11-18-1926   88 y.o. Female  MRN: [id number] Visit Date: 01/14/2022  Today's healthcare provider: [name], MD   No chief complaint on file.  Subjective    HPI  Patient comes in today for follow-up of multiple medical problems including walking pneumonia CHF hyponatremia anemia and chronic kidney disease along with diabetes.  She is feeling fairly well.  She is having no recent hypoglycemia.  Diabetes Mellitus Type II, Follow-up  Lab Results  Component Value Date   HGBA1C 6.7 (H) 11/26/2021   HGBA1C 6.4 (A) 09/10/2021   HGBA1C 6.9 (A) 05/08/2021   Wt Readings from Last 3 Encounters:  01/14/22 153 lb (69.4 kg)  12/04/21 154 lb (69.9 kg)  11/26/21 160 lb (72.6 kg)   Last seen for diabetes 1 months ago.  Management since then includes decreasing Lantus to 18 units. She reports good compliance with treatment. She is not having side effects.  Symptoms: No fatigue No foot ulcerations  No appetite changes No nausea  No paresthesia of the feet  No polydipsia  No polyuria No visual disturbances   No vomiting     Home blood sugar records:  140's150's  Episodes of hypoglycemia? No    Current insulin regiment: Lantus 18 units  Pertinent Labs: Lab Results  Component Value Date   CHOL 185 01/19/2021   HDL 53 01/19/2021   LDLCALC 102 (H) 01/19/2021   TRIG 175 (H) 01/19/2021   CHOLHDL 3.5 01/19/2021   Lab Results  Component Value Date   NA 137 12/04/2021   K 5.0 12/04/2021   CREATININE 1.09 (H) 12/04/2021   EGFR 47 (L) 12/04/2021   MICROALBUR 20 06/02/2017      --------------------------------------------------------------------------------------------------- Hypertension, follow-up  BP Readings from Last 3 Encounters:  01/14/22 (!) 120/50  12/04/21 (!) 160/74  11/28/21 (!) 147/33   Wt Readings from Last 3 Encounters:  01/14/22 153 lb (69.4 kg)  12/04/21 154 lb (69.9 kg)  11/26/21 160 lb (72.6 kg)     She was last seen for hypertension 1 months ago.  BP at that visit was as above. Management since that visit includes none.  She reports good compliance with treatment. She is not having side effects.   Symptoms: No chest pain No chest pressure  No palpitations No syncope  No dyspnea No orthopnea  No paroxysmal nocturnal dyspnea No lower extremity edema   Pertinent labs: Lab Results  Component Value Date   CHOL 185 01/19/2021   HDL 53 01/19/2021   LDLCALC 102 (H) 01/19/2021   TRIG 175 (H) 01/19/2021   CHOLHDL 3.5 01/19/2021   Lab Results  Component Value Date   NA 137 12/04/2021   K 5.0 12/04/2021   CREATININE 1.09 (H) 12/04/2021   EGFR 47 (L) 12/04/2021   GLUCOSE 146 (H) 12/04/2021   TSH 2.066 11/26/2021     The ASCVD Risk score (Arnett DK, et al., 2019) failed to calculate for the following reasons:   The 2019 ASCVD risk score is only valid for ages 12 to 35   ---------------------------------------------------------------------------------------------------   Medications: Outpatient Medications Prior to Visit  Medication Sig   ALPRAZolam (XANAX) 0.5 MG tablet TAKE 1 TABLET BY  MOUTH EVERY DAY AT BEDTIME AS NEEDED   Artificial Tear Ointment (DRY EYES OP) Apply 1 drop to eye daily.   aspirin EC 81 MG tablet Take 81 mg by mouth daily.   B-D ULTRAFINE III SHORT PEN 31G X 8 MM MISC CHECK SUGAR 2 TIMES DAILY, DX E11.9 (NEEDS ULTRA FINE PEN NEEDLES)   benzonatate (TESSALON) 100 MG capsule Take 1 capsule (100 mg total) by mouth 3 (three) times daily.   glucose blood (ONE TOUCH ULTRA TEST) test strip Check sugar twice  daily.   insulin glargine (LANTUS SOLOSTAR) 100 UNIT/ML Solostar Pen INJECT 25 UNITS SUBCUTANEOUSLY (Patient taking differently: Inject 20 Units into the skin daily.)   Insulin Pen Needle 31G X 8 MM MISC Check sugar 2 times daily, DX E11.9 (needs ultra fine pen needles)   Lancets (ONETOUCH ULTRASOFT) lancets Check sugar twice daily DX E11.9   lisinopril (ZESTRIL) 20 MG tablet Take 1 tablet (20 mg total) by mouth daily.   Magnesium 400 MG CAPS Take 400 mg by mouth daily.   Multiple Vitamins-Minerals (PRESERVISION AREDS) CAPS Take 1 capsule by mouth 2 (two) times daily.    nitroGLYCERIN (NITROSTAT) 0.4 MG SL tablet Place 1 tablet (0.4 mg total) under the tongue every 5 (five) minutes as needed for chest pain.   Omega-3 Fatty Acids (FISH OIL PO) Take by mouth daily.   ondansetron (ZOFRAN) 4 MG tablet TAKE 1 TABLET BY MOUTH EVERY 8 HOURS AS NEEDED FOR NAUSEA AND VOMITING   ONETOUCH ULTRA test strip CHECK SUGAR TWICE A DAY   pantoprazole (PROTONIX) 40 MG tablet Take 1 tablet (40 mg total) by mouth 2 (two) times daily before a meal.   No facility-administered medications prior to visit.    Review of Systems      Objective    BP (!) 120/50 (BP Location: Left Arm, Patient Position: Sitting, Cuff Size: Normal)    Pulse 83    Temp 98.4 F (36.9 C) (Oral)    Wt 153 lb (69.4 kg)    SpO2 97%    BMI 26.26 kg/m     Physical Exam Vitals reviewed.  Constitutional:      Appearance: She is well-developed.     Comments: She definitely looks younger than her age of 63.  HENT:     Head: Normocephalic and atraumatic.     Right Ear: External ear normal.     Left Ear: External ear normal.     Nose: Nose normal.  Eyes:     Conjunctiva/sclera: Conjunctivae normal.     Pupils: Pupils are equal, round, and reactive to light.  Neck:     Thyroid: No thyromegaly.  Cardiovascular:     Rate and Rhythm: Normal rate and regular rhythm.     Heart sounds: Murmur heard.     Comments: 3/6 Murmur at  RUSB. Pulmonary:     Effort: Pulmonary effort is normal.     Breath sounds: Normal breath sounds.  Abdominal:     Palpations: Abdomen is soft.  Musculoskeletal:     Cervical back: Normal range of motion and neck supple.  Skin:    General: Skin is warm and dry.  Neurological:     General: No focal deficit present.     Mental Status: She is alert and oriented to person, place, and time.     Deep Tendon Reflexes: Reflexes are normal and symmetric.  Psychiatric:        Mood and Affect: Mood normal.  Behavior: Behavior normal.        Thought Content: Thought content normal.        Judgment: Judgment normal.      No results found for any visits on 01/14/22.  Assessment & Plan     1. Congestive heart failure, unspecified HF chronicity, unspecified heart failure type ([place]) Clinically stable today.  Follow-up chest x-ray. - DG Chest 2 View; Future  2. Coronary artery disease involving native heart without angina pectoris, unspecified vessel or lesion type All risk factors treated.  No recent chest pain or syncope appraising  3. Hyponatremia Follow-up labs for stability - CBC with Differential/Platelet - Renal function panel  4. Pneumonia due to infectious organism, unspecified laterality, unspecified part of lung Follow-up chest - DG Chest 2 View; Future  5. Anemia, unspecified type CBC.  Keep hemoglobin above 8 - CBC with Differential/Platelet  6. Nonrheumatic aortic valve stenosis Followed by cardiology  7. Angina pectoris ([place]) Clinically stable with no chest pain  8. Benign essential HTN Good control.   No follow-ups on file.      I, [name], MD, have reviewed all documentation for this visit. The documentation on 01/15/22 for the exam, diagnosis, procedures, and orders are all accurate and complete.    Lacye Mccarn Cranford Mon, MD  [facility] [phone] (phone) [phone] (fax)  [place]

## 2022-01-15 LAB — RENAL FUNCTION PANEL
Albumin: 4.4 g/dL (ref 3.5–4.6)
BUN/Creatinine Ratio: 15 (ref 12–28)
BUN: 28 mg/dL (ref 10–36)
CO2: 23 mmol/L (ref 20–29)
Calcium: 9.3 mg/dL (ref 8.7–10.3)
Chloride: 100 mmol/L (ref 96–106)
Creatinine, Ser: 1.84 mg/dL — ABNORMAL HIGH (ref 0.57–1.00)
Glucose: 204 mg/dL — ABNORMAL HIGH (ref 70–99)
Phosphorus: 4.1 mg/dL (ref 3.0–4.3)
Potassium: 4.9 mmol/L (ref 3.5–5.2)
Sodium: 140 mmol/L (ref 134–144)
eGFR: 25 mL/min/{1.73_m2} — ABNORMAL LOW (ref >59)

## 2022-01-15 LAB — CBC WITH DIFFERENTIAL/PLATELET
Basophils Absolute: 0.1 10*3/uL (ref 0.0–0.2)
Basos: 1 %
EOS (ABSOLUTE): 0.1 10*3/uL (ref 0.0–0.4)
Eos: 1 %
Hematocrit: 35.8 % (ref 34.0–46.6)
Hemoglobin: 11.5 g/dL (ref 11.1–15.9)
Immature Grans (Abs): 0 10*3/uL (ref 0.0–0.1)
Immature Granulocytes: 0 %
Lymphocytes Absolute: 2.9 10*3/uL (ref 0.7–3.1)
Lymphs: 34 %
MCH: 30.3 pg (ref 26.6–33.0)
MCHC: 32.1 g/dL (ref 31.5–35.7)
MCV: 94 fL (ref 79–97)
Monocytes Absolute: 0.8 10*3/uL (ref 0.1–0.9)
Monocytes: 10 %
Neutrophils Absolute: 4.7 10*3/uL (ref 1.4–7.0)
Neutrophils: 54 %
RBC: 3.8 x10E6/uL (ref 3.77–5.28)
RDW: 14 % (ref 11.7–15.4)
WBC: 8.6 10*3/uL (ref 3.4–10.8)

## 2022-01-17 ENCOUNTER — Other Ambulatory Visit: Payer: Self-pay | Admitting: Family Medicine

## 2022-01-17 NOTE — Telephone Encounter (Signed)
Requested medication (s) are due for refill today - expired Rx ? ?Requested medication (s) are on the active medication list -yes ? ?Future visit scheduled -yes ? ?Last refill: 01/08/21 #100 12RF ? ?Notes to clinic: Request RF: expired Rx ? ?Requested Prescriptions  ?Pending Prescriptions Disp Refills  ? B-D ULTRAFINE III SHORT PEN 31G X 8 MM MISC [Pharmacy Med Name: BD UF SHORT PEN NEEDLE [id number]] 100 each 12  ?  Sig: CHECK SUGAR 2 TIMES DAILY, DX E11.9  ?  ? Endocrinology: Diabetes - Testing Supplies Passed - 01/17/2022  9:26 AM  ?  ?  Passed - Valid encounter within last 12 months  ?  Recent Outpatient Visits   ? ?      ? 3 days ago Congestive heart failure, unspecified HF chronicity, unspecified heart failure type ([place])  ? [facility] Jerrol Banana., MD  ? 1 month ago Benign essential HTN  ? [place], DO  ? 1 month ago Viral URI with cough  ? [facility] [place], Dionne Bucy, MD  ? 4 months ago Controlled type 2 diabetes mellitus with other circulatory complication, unspecified whether long term insulin use ([place])  ? [facility] Jerrol Banana., MD  ? 5 months ago Encounter for [employer] annual wellness exam  ? [facility] Jerrol Banana., MD  ? ?  ?  ?Future Appointments   ? ?        ? In 2 months Jerrol Banana., MD [facility]  ? ?  ? ?  ?  ?  ? ? ? ?Requested Prescriptions  ?Pending Prescriptions Disp Refills  ? B-D ULTRAFINE III SHORT PEN 31G X 8 MM MISC [Pharmacy Med Name: BD UF SHORT PEN NEEDLE [id number]] 100 each 12  ?  Sig: CHECK SUGAR 2 TIMES DAILY, DX E11.9  ?  ? Endocrinology: Diabetes - Testing Supplies Passed - 01/17/2022  9:26 AM  ?  ?  Passed - Valid encounter within last 12 months  ?  Recent Outpatient Visits   ? ?      ? 3 days ago Congestive heart failure, unspecified HF chronicity, unspecified heart failure type ([place])  ? [facility] Jerrol Banana., MD  ? 1 month ago Benign essential HTN  ? [place], DO  ? 1 month ago Viral URI with cough  ? [facility] [place], Dionne Bucy, MD  ? 4 months ago Controlled type 2 diabetes mellitus with other circulatory complication, unspecified whether long term insulin use ([place])  ? [facility] Jerrol Banana., MD  ? 5 months ago Encounter for [employer] annual wellness exam  ? [facility] Jerrol Banana., MD  ? ?  ?  ?Future Appointments   ? ?        ? In 2 months Jerrol Banana., MD [facility]  ? ?  ? ?  ?  ?  ? ? ? ?

## 2022-01-18 ENCOUNTER — Other Ambulatory Visit: Payer: Self-pay

## 2022-01-18 DIAGNOSIS — N289 Disorder of kidney and ureter, unspecified: Secondary | ICD-10-CM

## 2022-01-31 ENCOUNTER — Other Ambulatory Visit: Payer: Self-pay | Admitting: Cardiovascular Disease

## 2022-02-20 ENCOUNTER — Other Ambulatory Visit: Payer: Self-pay | Admitting: Nephrology

## 2022-02-20 DIAGNOSIS — E1122 Type 2 diabetes mellitus with diabetic chronic kidney disease: Secondary | ICD-10-CM | POA: Diagnosis not present

## 2022-02-20 DIAGNOSIS — E785 Hyperlipidemia, unspecified: Secondary | ICD-10-CM | POA: Diagnosis not present

## 2022-02-20 DIAGNOSIS — N184 Chronic kidney disease, stage 4 (severe): Secondary | ICD-10-CM | POA: Insufficient documentation

## 2022-02-20 DIAGNOSIS — R809 Proteinuria, unspecified: Secondary | ICD-10-CM

## 2022-02-20 DIAGNOSIS — I509 Heart failure, unspecified: Secondary | ICD-10-CM | POA: Diagnosis not present

## 2022-02-20 DIAGNOSIS — D631 Anemia in chronic kidney disease: Secondary | ICD-10-CM | POA: Diagnosis not present

## 2022-02-20 DIAGNOSIS — I10 Essential (primary) hypertension: Secondary | ICD-10-CM | POA: Diagnosis not present

## 2022-02-22 DIAGNOSIS — R42 Dizziness and giddiness: Secondary | ICD-10-CM | POA: Diagnosis not present

## 2022-02-22 DIAGNOSIS — J069 Acute upper respiratory infection, unspecified: Secondary | ICD-10-CM | POA: Diagnosis not present

## 2022-03-01 ENCOUNTER — Ambulatory Visit
Admission: RE | Admit: 2022-03-01 | Discharge: 2022-03-01 | Disposition: A | Discharge status: Home / Self Care | Payer: PPO | Source: Ambulatory Visit | Attending: Nephrology | Admitting: Nephrology

## 2022-03-01 ENCOUNTER — Other Ambulatory Visit: Payer: Self-pay

## 2022-03-01 DIAGNOSIS — N184 Chronic kidney disease, stage 4 (severe): Secondary | ICD-10-CM | POA: Insufficient documentation

## 2022-03-01 DIAGNOSIS — E1122 Type 2 diabetes mellitus with diabetic chronic kidney disease: Secondary | ICD-10-CM | POA: Diagnosis not present

## 2022-03-01 DIAGNOSIS — R809 Proteinuria, unspecified: Secondary | ICD-10-CM | POA: Insufficient documentation

## 2022-03-01 DIAGNOSIS — N2889 Other specified disorders of kidney and ureter: Secondary | ICD-10-CM | POA: Diagnosis not present

## 2022-03-07 ENCOUNTER — Other Ambulatory Visit: Payer: Self-pay | Admitting: Family Medicine

## 2022-03-07 DIAGNOSIS — E1159 Type 2 diabetes mellitus with other circulatory complications: Secondary | ICD-10-CM

## 2022-03-07 NOTE — Telephone Encounter (Signed)
Notification from Pharmacy that PA is needed. Routed to office. ?

## 2022-03-07 NOTE — Telephone Encounter (Signed)
Requested Prescriptions  ?Pending Prescriptions Disp Refills  ?? LANTUS SOLOSTAR 100 UNIT/ML Solostar Pen [Pharmacy Med Name: LANTUS SOLOSTAR 100 UNIT/ML] 45 mL 0  ?  Sig: INJECT 25 UNITS SUBCUTANEOUSLY  ?  ? Endocrinology:  Diabetes - Insulins Passed - 03/07/2022  2:23 AM  ?  ?  Passed - HBA1C is between 0 and 7.9 and within 180 days  ?  Hgb A1c MFr Bld  ?Date Value Ref Range Status  ?11/26/2021 6.7 (H) 4.8 - 5.6 % Final  ?  Comment:  ?  (NOTE) ?Pre diabetes:          5.7%-6.4% ? ?Diabetes:              >6.4% ? ?Glycemic control for   <7.0% ?adults with diabetes ?  ?   ?  ?  Passed - Valid encounter within last 6 months  ?  Recent Outpatient Visits   ?      ? 1 month ago Congestive heart failure, unspecified HF chronicity, unspecified heart failure type ([place])  ? [facility] Jerrol Banana., MD  ? 3 months ago Benign essential HTN  ? [place], DO  ? 3 months ago Viral URI with cough  ? [facility] [place], Dionne Bucy, MD  ? 5 months ago Controlled type 2 diabetes mellitus with other circulatory complication, unspecified whether long term insulin use ([place])  ? [facility] Jerrol Banana., MD  ? 6 months ago Encounter for [employer] annual wellness exam  ? [facility] Jerrol Banana., MD  ?  ?  ?Future Appointments   ?        ? In 1 month Jerrol Banana., MD [facility]  ?  ? ?  ?  ?  ? ?

## 2022-03-09 ENCOUNTER — Other Ambulatory Visit: Payer: Self-pay | Admitting: Family Medicine

## 2022-03-09 DIAGNOSIS — E118 Type 2 diabetes mellitus with unspecified complications: Secondary | ICD-10-CM

## 2022-03-11 NOTE — Telephone Encounter (Signed)
PA approved.

## 2022-03-11 NOTE — Telephone Encounter (Signed)
PA started

## 2022-03-13 ENCOUNTER — Ambulatory Visit (INDEPENDENT_AMBULATORY_CARE_PROVIDER_SITE_OTHER): Payer: PPO | Admitting: Physician Assistant

## 2022-03-13 ENCOUNTER — Ambulatory Visit
Admission: RE | Admit: 2022-03-13 | Discharge: 2022-03-13 | Disposition: A | Discharge status: Home / Self Care | Payer: PPO | Source: Ambulatory Visit | Attending: Physician Assistant | Admitting: Physician Assistant

## 2022-03-13 ENCOUNTER — Encounter: Payer: Self-pay | Admitting: Physician Assistant

## 2022-03-13 ENCOUNTER — Ambulatory Visit: Payer: Self-pay | Admitting: *Deleted

## 2022-03-13 ENCOUNTER — Ambulatory Visit
Admission: RE | Admit: 2022-03-13 | Discharge: 2022-03-13 | Disposition: A | Discharge status: Home / Self Care | Payer: PPO | Attending: Physician Assistant | Admitting: Physician Assistant

## 2022-03-13 VITALS — BP 139/72 | HR 71 | Temp 97.6°F | Ht 64.5 in | Wt 153.2 lb

## 2022-03-13 DIAGNOSIS — J189 Pneumonia, unspecified organism: Secondary | ICD-10-CM | POA: Diagnosis not present

## 2022-03-13 DIAGNOSIS — R0989 Other specified symptoms and signs involving the circulatory and respiratory systems: Secondary | ICD-10-CM | POA: Diagnosis not present

## 2022-03-13 DIAGNOSIS — R059 Cough, unspecified: Secondary | ICD-10-CM | POA: Diagnosis not present

## 2022-03-13 NOTE — Telephone Encounter (Signed)
?  Chief Complaint: cough hx heart issues  ?Symptoms: cough non productive non stop at times. "Wearing me out". Constant nasal drip .  ?Frequency: x 1 week  ?Pertinent Negatives: Patient denies chest pain , difficulty breathing, no fever no issues sleeping  ?Disposition: '[]'$ ED /'[]'$ Urgent Care (no appt availability in office) / '[x]'$ Appointment(In office/virtual)/ '[]'$  [place] [facility]/ '[]'$ Home Care/ '[]'$ Refused Recommended Disposition /'[]'$ [place] [facility]/ '[]'$  Follow-up with PCP ?Additional Notes:  ? ?Reports hx of hospitalizations after coughing episodes . See hx . Appt scheduled for today  ? ? Reason for Disposition ? Cough has been present for > 3 weeks ?   X 1 week ? ?Answer Assessment - Initial Assessment Questions ?1. ONSET: "When did the cough begin?"  ?    1 week ago  ?2. SEVERITY: "How bad is the cough today?"  ?    Hard to stop when starting coughing  ?3. SPUTUM: "Describe the color of your sputum" (none, dry cough; clear, white, yellow, green) ?    clear ?4. HEMOPTYSIS: "Are you coughing up any blood?" If so ask: "How much?" (flecks, streaks, tablespoons, etc.) ?    Na  ?5. DIFFICULTY BREATHING: "Are you having difficulty breathing?" If Yes, ask: "How bad is it?" (e.g., mild, moderate, severe)  ?  - MILD: No SOB at rest, mild SOB with walking, speaks normally in sentences, can lie down, no retractions, pulse < 100.  ?  - MODERATE: SOB at rest, SOB with minimal exertion and prefers to sit, cannot lie down flat, speaks in phrases, mild retractions, audible wheezing, pulse 100-120.  ?  - SEVERE: Very SOB at rest, speaks in single words, struggling to breathe, sitting hunched forward, retractions, pulse > 120  ?    Mild  ?6. FEVER: "Do you have a fever?" If Yes, ask: "What is your temperature, how was it measured, and when did it start?" ?    No  ?7. CARDIAC HISTORY: "Do you have any history of heart disease?" (e.g., heart attack, congestive heart failure)  ?    Hx heart failure  ?8. LUNG HISTORY: "Do you  have any history of lung disease?"  (e.g., pulmonary embolus, asthma, emphysema) ?    na ?9. PE RISK FACTORS: "Do you have a history of blood clots?" (or: recent major surgery, recent prolonged travel, bedridden) ?    a ?10. OTHER SYMPTOMS: "Do you have any other symptoms?" (e.g., runny nose, wheezing, chest pain) ?      Cough  ?11. PREGNANCY: "Is there any chance you are pregnant?" "When was your last menstrual period?" ?      na ?12. TRAVEL: "Have you traveled out of the country in the last month?" (e.g., travel history, exposures) ?      na ? ?Protocols used: Cough - Acute Non-Productive-A-AH ? ?

## 2022-03-13 NOTE — Telephone Encounter (Signed)
Please advise 

## 2022-03-13 NOTE — Progress Notes (Signed)
? ? ?  I,Sha'taria Tyson,acting as a Education administrator for [employer], PA-C.,have documented all relevant documentation on the behalf of Mikey Kirschner, PA-C,as directed by  Mikey Kirschner, PA-C while in the presence of Mikey Kirschner, PA-C. ? ? ?Acute Office Visit ? ?Subjective:  ? ?  ?Patient ID: Jasmine Webster, female    DOB: [birth year], 88 y.o.   MRN: [id number] ? ?Cc. Cough x 1 week ? ?HPI ?Patient is in today for cough that she has had for one week. She has been taking tussin OTC. Cough is dry, chest congestion. Some associated fatigue. Denies sob, nasal congestion, headaches, fevers.  ? ?Review of Systems  ?Constitutional:  Positive for malaise/fatigue. Negative for fever.  ?HENT:  Negative for congestion, sinus pain and sore throat.   ?Respiratory:  Positive for cough. Negative for shortness of breath and wheezing.   ?Neurological:  Negative for dizziness and headaches.  ? ? ?   ?Objective:  ?  ?Blood pressure 139/72, pulse 71, temperature 97.6 ?F (36.4 ?C), temperature source Oral, height 5' 4.5" (1.638 m), weight 153 lb 3.2 oz (69.5 kg), SpO2 100 %.  ? ?Physical Exam ?Constitutional:   ?   General: She is awake.  ?   Appearance: She is well-developed.  ?HENT:  ?   Head: Normocephalic.  ?Eyes:  ?   Conjunctiva/sclera: Conjunctivae normal.  ?Cardiovascular:  ?   Rate and Rhythm: Normal rate and regular rhythm.  ?   Heart sounds: Normal heart sounds.  ?Pulmonary:  ?   Effort: Pulmonary effort is normal.  ?   Breath sounds: Wheezing and rhonchi present.  ?Musculoskeletal:  ?   Right lower leg: No edema.  ?   Left lower leg: No edema.  ?Skin: ?   General: Skin is warm.  ?Neurological:  ?   Mental Status: She is alert and oriented to person, place, and time.  ?Psychiatric:     ?   Attention and Perception: Attention normal.     ?   Mood and Affect: Mood normal.     ?   Speech: Speech normal.     ?   Behavior: Behavior is cooperative.  ? ? ?No results found for any visits on 03/13/22. ? ? ?   ?Assessment & Plan:   ? ?Suspicion for walking pneumonia ?No signs of fluid overload ?Cxr ordered ?Cmp, cbc.  ?Will wait on new GFR and cxr to rx antibiotics ?Advised increasing fluids over nephrologist's recommended 40 oz slightly for a few days ?Ok to take cough medication ?Pt examined by Dr Rosanna Randy as well who verbally agreed with recommendations  ? ?I, Mikey Kirschner, PA-C have reviewed all documentation for this visit. The documentation on  03/13/2022  for the exam, diagnosis, procedures, and orders are all accurate and complete. ? ?Mikey Kirschner, PA-C ?[place] [id number] ?[place], [place], [address] ?Office: [phone] ?Fax: [phone]  ? ? ?

## 2022-03-14 ENCOUNTER — Ambulatory Visit: Payer: Self-pay | Admitting: *Deleted

## 2022-03-14 ENCOUNTER — Telehealth: Payer: Self-pay

## 2022-03-14 NOTE — Telephone Encounter (Signed)
?  Summary: Medication Management.  ? Pt stated Dr.Gilbert changed insulin and pt is unsure if she should finish her 3 pens that she has left then start on the new medication semglee insulin. Pt is requesting to speak with a nurse for clarification.  ? ? ?Pt seeking clinical advice.   ?  ? ? ? ?Chief Complaint: requesting clarification of insulin and requesting x ray results ?Symptoms: new insulin prescribed ?Frequency: today per patietnt ?Pertinent Negatives: Patient denies na ?Disposition: '[]'$ ED /'[]'$ Urgent Care (no appt availability in office) / '[]'$ Appointment(In office/virtual)/ '[]'$  [place] [facility]/ '[]'$ Home Care/ '[]'$ Refused Recommended Disposition /'[]'$ [place] [facility]/ '[x]'$  Follow-up with PCP ?Additional Notes:  ?Patient reports PCP changed insulin and dose today . No documentation noted at this time. Patient  would like to know if she is to continue lantus 25 units  or 18 units and use up her 3 pens before starting semglee insulin ordered today? Please advise and call back when results of xray available .  ? ? ? ? ? ?Reason for Disposition ? [1] Caller has URGENT medicine question about med that PCP or specialist prescribed AND [2] triager unable to answer question ? ?Answer Assessment - Initial Assessment Questions ?1. NAME of MEDICATION: "What medicine are you calling about?" ?    Semglee insulin  ?2. QUESTION: "What is your question?" (e.g., double dose of medicine, side effect) ?    Should patient take her remaining lantus insulin 18 units and use up her 3 pens before starting the new insulin semglee? ?3. PRESCRIBING HCP: "Who prescribed it?" Reason: if prescribed by specialist, call should be referred to that group. ?    Patient report PCP  ?4. SYMPTOMS: "Do you have any symptoms?" ?    no ?5. SEVERITY: If symptoms are present, ask "Are they mild, moderate or severe?" ?    na ?6. PREGNANCY:  "Is there any chance that you are pregnant?" "When was your last menstrual period?" ?    na ? ?Protocols used:  Medication Question Call-A-AH ? ?

## 2022-03-14 NOTE — Telephone Encounter (Signed)
Copied from [place] [id number]. Topic: General - Other ?>> Mar 14, 2022 10:05 AM Valere Dross wrote: ?Reason for CRM: Pts daughter called in wanting to see about the imaging results and what medication they were planning to put the pt on, please advise. ?

## 2022-03-15 LAB — COMPREHENSIVE METABOLIC PANEL
ALT: 11 IU/L (ref 0–32)
AST: 19 IU/L (ref 0–40)
Albumin/Globulin Ratio: 2.2 (ref 1.2–2.2)
Albumin: 4.3 g/dL (ref 3.5–4.6)
Alkaline Phosphatase: 135 IU/L — ABNORMAL HIGH (ref 44–121)
BUN/Creatinine Ratio: 19 (ref 12–28)
BUN: 22 mg/dL (ref 10–36)
Bilirubin Total: 0.2 mg/dL (ref 0.0–1.2)
CO2: 21 mmol/L (ref 20–29)
Calcium: 9.1 mg/dL (ref 8.7–10.3)
Chloride: 99 mmol/L (ref 96–106)
Creatinine, Ser: 1.16 mg/dL — ABNORMAL HIGH (ref 0.57–1.00)
Globulin, Total: 2 g/dL (ref 1.5–4.5)
Glucose: 165 mg/dL — ABNORMAL HIGH (ref 70–99)
Potassium: 4.6 mmol/L (ref 3.5–5.2)
Sodium: 136 mmol/L (ref 134–144)
Total Protein: 6.3 g/dL (ref 6.0–8.5)
eGFR: 44 mL/min/{1.73_m2} — ABNORMAL LOW (ref >59)

## 2022-03-15 LAB — CBC WITH DIFFERENTIAL/PLATELET
Basophils Absolute: 0.1 10*3/uL (ref 0.0–0.2)
Basos: 1 %
EOS (ABSOLUTE): 0.1 10*3/uL (ref 0.0–0.4)
Eos: 2 %
Hematocrit: 33.7 % — ABNORMAL LOW (ref 34.0–46.6)
Hemoglobin: 10.6 g/dL — ABNORMAL LOW (ref 11.1–15.9)
Immature Grans (Abs): 0 10*3/uL (ref 0.0–0.1)
Immature Granulocytes: 0 %
Lymphocytes Absolute: 2.5 10*3/uL (ref 0.7–3.1)
Lymphs: 40 %
MCH: 28.7 pg (ref 26.6–33.0)
MCHC: 31.5 g/dL (ref 31.5–35.7)
MCV: 91 fL (ref 79–97)
Monocytes Absolute: 0.7 10*3/uL (ref 0.1–0.9)
Monocytes: 12 %
Neutrophils Absolute: 2.9 10*3/uL (ref 1.4–7.0)
Neutrophils: 45 %
RBC: 3.69 x10E6/uL — ABNORMAL LOW (ref 3.77–5.28)
RDW: 12.9 % (ref 11.7–15.4)
WBC: 6.2 10*3/uL (ref 3.4–10.8)

## 2022-03-20 DIAGNOSIS — R809 Proteinuria, unspecified: Secondary | ICD-10-CM | POA: Diagnosis not present

## 2022-03-20 DIAGNOSIS — N184 Chronic kidney disease, stage 4 (severe): Secondary | ICD-10-CM | POA: Diagnosis not present

## 2022-03-20 DIAGNOSIS — E785 Hyperlipidemia, unspecified: Secondary | ICD-10-CM | POA: Diagnosis not present

## 2022-03-20 DIAGNOSIS — E1122 Type 2 diabetes mellitus with diabetic chronic kidney disease: Secondary | ICD-10-CM | POA: Diagnosis not present

## 2022-03-20 DIAGNOSIS — D631 Anemia in chronic kidney disease: Secondary | ICD-10-CM | POA: Diagnosis not present

## 2022-03-20 DIAGNOSIS — I10 Essential (primary) hypertension: Secondary | ICD-10-CM | POA: Diagnosis not present

## 2022-03-20 DIAGNOSIS — I509 Heart failure, unspecified: Secondary | ICD-10-CM | POA: Diagnosis not present

## 2022-03-21 ENCOUNTER — Ambulatory Visit: Payer: PPO | Admitting: Podiatry

## 2022-03-21 ENCOUNTER — Encounter: Payer: Self-pay | Admitting: Podiatry

## 2022-03-21 DIAGNOSIS — M79675 Pain in left toe(s): Secondary | ICD-10-CM | POA: Diagnosis not present

## 2022-03-21 DIAGNOSIS — M79674 Pain in right toe(s): Secondary | ICD-10-CM | POA: Diagnosis not present

## 2022-03-21 DIAGNOSIS — E119 Type 2 diabetes mellitus without complications: Secondary | ICD-10-CM | POA: Diagnosis not present

## 2022-03-21 DIAGNOSIS — B351 Tinea unguium: Secondary | ICD-10-CM

## 2022-03-21 NOTE — Progress Notes (Signed)
This patient returns to my office for at risk foot care.  This patient requires this care by a professional since this patient will be at risk due to having diabetes  This patient is unable to cut nails herself since the patient cannot reach her nails.These nails are painful walking and wearing shoes.  This patient presents for at risk foot care today. ? ?General Appearance  Alert, conversant and in no acute stress. ? ?Vascular  Dorsalis pedis and posterior tibial  pulses are weakly  palpable  bilaterally.  Capillary return is within normal limits  bilaterally. Temperature is within normal limits  bilaterally. ? ?Neurologic  Senn-Weinstein monofilament wire test within normal limits  bilaterally. Muscle power within normal limits bilaterally. ? ?Nails Thick disfigured discolored nails with subungual debris  from hallux to fifth toes bilaterally except nail plate left hallux.. No evidence of bacterial infection or drainage bilaterally. ? ?Orthopedic  No limitations of motion  feet .  No crepitus or effusions noted.  HAV  B/L.  Hammer toes second  B/L. ? ?Skin  normotropic skin with no porokeratosis noted bilaterally.  No signs of infections or ulcers noted.    ? ?Onychomycosis  Pain in right toes  Pain in left toes ? ?Consent was obtained for treatment procedures.   Mechanical debridement of nails 1-5  bilaterally performed with a nail nipper.  Filed with dremel without incident.  ? ? ?Return office visit   3 months                   Told patient to return for periodic foot care and evaluation due to potential at risk complications. ? ? ?Gardiner Barefoot DPM  ?

## 2022-03-28 ENCOUNTER — Encounter: Payer: Self-pay | Admitting: Physician Assistant

## 2022-03-28 ENCOUNTER — Ambulatory Visit (INDEPENDENT_AMBULATORY_CARE_PROVIDER_SITE_OTHER): Payer: PPO | Admitting: Physician Assistant

## 2022-03-28 ENCOUNTER — Ambulatory Visit: Payer: Self-pay | Admitting: *Deleted

## 2022-03-28 VITALS — BP 133/47 | HR 68 | Temp 98.1°F | Resp 16 | Wt 153.7 lb

## 2022-03-28 DIAGNOSIS — H748X1 Other specified disorders of right middle ear and mastoid: Secondary | ICD-10-CM

## 2022-03-28 NOTE — Telephone Encounter (Signed)
?  Chief Complaint: small amount of blood in ear ?Symptoms: small amount of blood in R ear ?Frequency: 2-3 days- dried blood now ?Pertinent Negatives: Patient denies pain,headache, fever, dizziness, vomiting, runny nose, ?Disposition: '[]'$ ED /'[]'$ Urgent Care (no appt availability in office) / '[x]'$ Appointment(In office/virtual)/ '[]'$  [place] [facility]/ '[]'$ Home Care/ '[]'$ Refused Recommended Disposition /'[]'$ [place] [facility]/ '[]'$  Follow-up with PCP ?Additional Notes:    ?

## 2022-03-28 NOTE — Telephone Encounter (Signed)
Reason for Disposition ? Unexplained bleeding from ear ? ?Answer Assessment - Initial Assessment Questions ?1. LOCATION: "Which ear is involved?"  ?    R ear ?2. COLOR: "What is the color of the discharge?"  ?    Dried blood- fresh blood yesterday ?3. CONSISTENCY: "How runny is the discharge? Could it be water?"  ?    Patient reports she felt crusting in ear- uses hearing aid ?4. ONSET: "When did you first notice the discharge?" ?    2-3 nights ?5. PAIN: "Is there any earache?" "How bad is it?"  (Scale 1-10; or mild, moderate, severe) ?    Patient has had pain in that ear in the past- no pain now ?6. OBJECTS: "Have you put anything in your ear?" (e.g., Q-tip, other object)  ?    Hearing aid, cleaning ear with peroxide on Q-tip- does not insert all the way in ear ?7. OTHER SYMPTOMS: "Do you have any other symptoms?" (e.g., headache, fever, dizziness, vomiting, runny nose) ?    no ?8. PREGNANCY: "Is there any chance you are pregnant?" "When was your last menstrual period?" ? ?Protocols used: Ear - Discharge-A-AH ? ?

## 2022-03-28 NOTE — Patient Instructions (Addendum)
For your  ear I recommend the following ? ?No more Q-tips : if you need to clean your ears or dry them use a damp towel ?To clean them I recommend placing a few drops of peroxide and letting it sit in the ear for about a minute before allowing to drain from the canal ? ?While it is healing do not place anything into your ear except to use a towel to dry it  ?This will prevent further damage and potential infection ? ?Please return in 2-4 weeks for [name] to follow up  ?Injuries like this tend to resolve on their own as long as further injury is not sustained from poking or trauma  ?Please ask your PCP to look in your ear at your next apt in 2 weeks to make sure the ear drum is healing well.  ? ?If you notice any of the following please let us know: ear swelling, fever, drainage from the ear, decreased hearing, ear pain  ? ? ?

## 2022-03-28 NOTE — Progress Notes (Signed)
?  ? ?I,[name],acting as a scribe for [employer], PA-C.,have documented all relevant documentation on the behalf of [place], PA-C,as directed by  [employer], PA-C while in the presence of Safaa Stingley E Dontai Pember, PA-C.  ? ?Established patient visit ? ? ?Patient: Jasmine Webster   DOB: [birth year]   88 y.o. Female  MRN: [id number] ?Visit Date: 03/28/2022 ? ?Today's healthcare provider: Dani Gobble Rodrickus Min, PA-C  ?Introduced myself to the patient as a Journalist, newspaper and provided education on APPs in clinical practice.  ? ? ? ?Chief Complaint  ?Patient presents with  ? blood in Rgiht ear  ? ?Subjective  ?  ?HPI  ?Patient is a 87 year old female with concerns of small amount of blood in R ear for the past 2-3 days. Reports that the blood is dried now. Patient denies ear pain, headache, fever, dizziness, or URI symptoms. ? ?States she noticed blood in her ear yesterday morning when she was cleaning her ear with a Qtip  ?Thinks it is mostly dry but yesterday was bright red ?Has concern it may by from inserting her hearing aid ?She denies trauma, barotrauma, injury to the head or face, inner ear pain or drainage from the ear ? ? ?Medications: ?Outpatient Medications Prior to Visit  ?Medication Sig  ? ALPRAZolam (XANAX) 0.5 MG tablet TAKE 1 TABLET BY MOUTH EVERY DAY AT BEDTIME AS NEEDED  ? Artificial Tear Ointment (DRY EYES OP) Apply 1 drop to eye daily.  ? aspirin EC 81 MG tablet Take 81 mg by mouth daily.  ? benzonatate (TESSALON) 100 MG capsule Take 1 capsule (100 mg total) by mouth 3 (three) times daily.  ? furosemide (LASIX) 20 MG tablet TAKE 1 TABLET (20 MG TOTAL) BY MOUTH AS DIRECTED. TAKE 1 TABLET (20 MG) 2-3 TIMES A WEEK  ? glucose blood (ONE TOUCH ULTRA TEST) test strip Check sugar twice daily.  ? Insulin Pen Needle (B-D ULTRAFINE III SHORT PEN) 31G X 8 MM MISC CHECK SUGAR 2 TIMES DAILY, DX E11.9  ? Insulin Pen Needle 31G X 8 MM MISC Check sugar 2 times daily, DX E11.9 (needs ultra fine pen needles)  ? Lancets (ONETOUCH  ULTRASOFT) lancets Check sugar twice daily DX E11.9  ? LANTUS SOLOSTAR 100 UNIT/ML Solostar Pen INJECT 25 UNITS SUBCUTANEOUSLY  ? lisinopril (ZESTRIL) 20 MG tablet Take 1 tablet (20 mg total) by mouth daily.  ? Magnesium 400 MG CAPS Take 400 mg by mouth daily.  ? Multiple Vitamins-Minerals (PRESERVISION AREDS) CAPS Take 1 capsule by mouth 2 (two) times daily.   ? nitroGLYCERIN (NITROSTAT) 0.4 MG SL tablet Place 1 tablet (0.4 mg total) under the tongue every 5 (five) minutes as needed for chest pain.  ? Omega-3 Fatty Acids (FISH OIL PO) Take by mouth daily.  ? ondansetron (ZOFRAN) 4 MG tablet TAKE 1 TABLET BY MOUTH EVERY 8 HOURS AS NEEDED FOR NAUSEA AND VOMITING  ? ONETOUCH ULTRA test strip CHECK SUGAR TWICE A DAY  ? pantoprazole (PROTONIX) 40 MG tablet Take 1 tablet (40 mg total) by mouth 2 (two) times daily before a meal.  ? ?No facility-administered medications prior to visit.  ? ? ?Review of Systems  ?Constitutional:  Negative for chills, diaphoresis and fever.  ?HENT:  Negative for ear discharge, ear pain and hearing loss.   ?     Bleeding from right ear  ?Neurological:  Negative for dizziness, light-headedness and headaches.  ? ? ?  Objective  ?  ?BP Marland Kitchen)  133/47 (BP Location: Right Arm)   Pulse 68   Temp 98.1 ?F (36.7 ?C) (Oral)   Resp 16   Wt 153 lb 11.2 oz (69.7 kg)   BMI 25.98 kg/m?  ? ? ?Physical Exam ?Vitals reviewed.  ?Constitutional:   ?   Appearance: Normal appearance. She is normal weight.  ?HENT:  ?   Head: Normocephalic.  ?   Right Ear: External ear normal. No decreased hearing noted. No laceration, drainage or swelling. There is hemotympanum. Tympanic membrane is erythematous. Tympanic membrane is not scarred, perforated, retracted or bulging.  ?   Left Ear: External ear normal. No decreased hearing noted. No laceration, drainage or swelling. No hemotympanum. Tympanic membrane is not scarred, perforated, erythematous, retracted or bulging.  ?Eyes:  ?   General: Lids are normal. Vision grossly  intact. No allergic shiner. ?Neurological:  ?   Mental Status: She is alert.  ?  ? ? ?No results found for any visits on 03/28/22. ? Assessment & Plan  ?  ? ?Problem List Items Addressed This Visit   ?None ?Visit Diagnoses   ? ? Hemotympanum, right    -  Primary ?Acute, new concern ?Started yesterday when she noted blood from her ear canal  ?Denies trauma, barotrauma, injury to head or face ?She reports she frequently uses Q-tips to get water out of her ears after showers ?Suspect blunt force injury to the tympanic membrane from Q-tip use ?Reviewed recommendations that she does not place anything in her ear canal from now on ?Reviewed proper ways to clean and dry the ears for her future practice.  ?Reviewed signs of infection she should alert [name] to for further evaluation ?Follow up in 2-4 weeks to monitor progress and recovery ?  ? ?  ? ? ? ?Return in about 4 weeks (around 04/25/2022) for hemotympanum follow up . ? ? ?I, Blaise Palladino E Nielle Duford, PA-C, have reviewed all documentation for this visit. The documentation on 03/28/22 for the exam, diagnosis, procedures, and orders are all accurate and complete. ? ? ?Mallerie Blok, MHS, PA-C ?[facility] ?[facility]  ? ? ?

## 2022-04-11 ENCOUNTER — Ambulatory Visit (INDEPENDENT_AMBULATORY_CARE_PROVIDER_SITE_OTHER): Payer: PPO | Admitting: Family Medicine

## 2022-04-11 ENCOUNTER — Encounter: Payer: Self-pay | Admitting: Family Medicine

## 2022-04-11 VITALS — BP 110/60 | HR 74 | Temp 97.6°F | Resp 16 | Ht 64.0 in | Wt 150.0 lb

## 2022-04-11 DIAGNOSIS — I10 Essential (primary) hypertension: Secondary | ICD-10-CM | POA: Diagnosis not present

## 2022-04-11 DIAGNOSIS — R5381 Other malaise: Secondary | ICD-10-CM

## 2022-04-11 DIAGNOSIS — N1832 Chronic kidney disease, stage 3b: Secondary | ICD-10-CM | POA: Diagnosis not present

## 2022-04-11 DIAGNOSIS — R5383 Other fatigue: Secondary | ICD-10-CM | POA: Diagnosis not present

## 2022-04-11 DIAGNOSIS — I25118 Atherosclerotic heart disease of native coronary artery with other forms of angina pectoris: Secondary | ICD-10-CM | POA: Diagnosis not present

## 2022-04-11 DIAGNOSIS — E782 Mixed hyperlipidemia: Secondary | ICD-10-CM

## 2022-04-11 DIAGNOSIS — I350 Nonrheumatic aortic (valve) stenosis: Secondary | ICD-10-CM

## 2022-04-11 DIAGNOSIS — E1159 Type 2 diabetes mellitus with other circulatory complications: Secondary | ICD-10-CM | POA: Diagnosis not present

## 2022-04-11 DIAGNOSIS — E1122 Type 2 diabetes mellitus with diabetic chronic kidney disease: Secondary | ICD-10-CM

## 2022-04-11 DIAGNOSIS — E871 Hypo-osmolality and hyponatremia: Secondary | ICD-10-CM

## 2022-04-11 DIAGNOSIS — Z794 Long term (current) use of insulin: Secondary | ICD-10-CM

## 2022-04-11 DIAGNOSIS — I209 Angina pectoris, unspecified: Secondary | ICD-10-CM

## 2022-04-11 LAB — POCT GLYCOSYLATED HEMOGLOBIN (HGB A1C)
Est. average glucose Bld gHb Est-mCnc: 166
Hemoglobin A1C: 7.4 % — AB (ref 4.0–5.6)

## 2022-04-11 NOTE — Progress Notes (Signed)
Established patient visit  I,Jasmine Webster,acting as a scribe for [name], MD.,have documented all relevant documentation on the behalf of [name], MD,as directed by  [name], MD while in the presence of [name], MD.   Patient: Jasmine Webster   DOB: 02/18/1926   88 y.o. Female  MRN: [id number] Visit Date: 04/11/2022  Today's healthcare provider: [name], MD   Chief Complaint  Patient presents with   Follow-up   Hypertension   Diabetes   Subjective    HPI  Patient comes in today for routine follow-up.  She feels well except for some leg pain On review of systems she does have some slowly progressive dyspnea on exertion over the past several months.  No chest pain with it. She is having no hypoglycemia or known hyperglycemic episodes Diabetes Mellitus Type II, follow-up  Lab Results  Component Value Date   HGBA1C 7.4 (A) 04/11/2022   HGBA1C 6.7 (H) 11/26/2021   HGBA1C 6.4 (A) 09/10/2021   Last seen for diabetes 4 months ago.  Management since then includes; Decreased lantus to 18u. Advised to keep log of blood sugars and bring to appt next month.  Home blood sugar records: fasting range: 100--130 Most Recent Eye Exam: requested latest results.  --------------------------------------------------------------------------------------------------- Hypertension, follow-up  BP Readings from Last 3 Encounters:  04/11/22 110/60  03/28/22 (!) 133/47  03/13/22 139/72   Wt Readings from Last 3 Encounters:  04/11/22 150 lb (68 kg)  03/28/22 153 lb 11.2 oz (69.7 kg)  03/13/22 153 lb 3.2 oz (69.5 kg)     She was last seen for hypertension 4 months ago.  Management since that visit includes; will continue to withhold diuretics. No LE edema on exam today. Obtaining labs. .  Outside blood pressures are  139/65.  --------------------------------------------------------------------------------------------------   Medications: Outpatient Medications Prior to Visit  Medication Sig   ALPRAZolam (XANAX) 0.5 MG tablet TAKE 1 TABLET BY MOUTH EVERY DAY AT BEDTIME AS NEEDED   Artificial Tear Ointment (DRY EYES OP) Apply 1 drop to eye daily.   aspirin EC 81 MG tablet Take 81 mg by mouth daily.   benzonatate (TESSALON) 100 MG capsule Take 1 capsule (100 mg total) by mouth 3 (three) times daily.   furosemide (LASIX) 20 MG tablet TAKE 1 TABLET (20 MG TOTAL) BY MOUTH AS DIRECTED. TAKE 1 TABLET (20 MG) 2-3 TIMES A WEEK   glucose blood (ONE TOUCH ULTRA TEST) test strip Check sugar twice daily.   Insulin Pen Needle (B-D ULTRAFINE III SHORT PEN) 31G X 8 MM MISC CHECK SUGAR 2 TIMES DAILY, DX E11.9   Insulin Pen Needle 31G X 8 MM MISC Check sugar 2 times daily, DX E11.9 (needs ultra fine pen needles)   Lancets (ONETOUCH ULTRASOFT) lancets Check sugar twice daily DX E11.9   LANTUS SOLOSTAR 100 UNIT/ML Solostar Pen INJECT 25 UNITS SUBCUTANEOUSLY   lisinopril (ZESTRIL) 20 MG tablet Take 1 tablet (20 mg total) by mouth daily.   Magnesium 400 MG CAPS Take 400 mg by mouth daily.   Multiple Vitamins-Minerals (PRESERVISION AREDS) CAPS Take 1 capsule by mouth 2 (two) times daily.    nitroGLYCERIN (NITROSTAT) 0.4 MG SL tablet Place 1 tablet (0.4 mg total) under the tongue every 5 (five) minutes as needed for chest pain.   Omega-3 Fatty Acids (FISH OIL PO) Take by mouth daily.   ondansetron (ZOFRAN) 4 MG tablet TAKE 1 TABLET BY MOUTH EVERY 8 HOURS AS  NEEDED FOR NAUSEA AND VOMITING   ONETOUCH ULTRA test strip CHECK SUGAR TWICE A DAY   pantoprazole (PROTONIX) 40 MG tablet Take 1 tablet (40 mg total) by mouth 2 (two) times daily before a meal.   No facility-administered medications prior to visit.    Review of Systems  Constitutional:  Negative for appetite change, chills, fatigue and fever.  Respiratory:  Negative  for chest tightness and shortness of breath.   Cardiovascular:  Negative for chest pain and palpitations.  Gastrointestinal:  Negative for abdominal pain, nausea and vomiting.  Neurological:  Negative for dizziness and weakness.   Last hemoglobin A1c Lab Results  Component Value Date   HGBA1C 7.4 (A) 04/11/2022       Objective    BP 110/60 (BP Location: Right Arm, Patient Position: Sitting, Cuff Size: Normal)   Pulse 74   Temp 97.6 F (36.4 C) (Temporal)   Resp 16   Ht '5\' 4"'$  (1.626 m)   Wt 150 lb (68 kg)   SpO2 96%   BMI 25.75 kg/m  BP Readings from Last 3 Encounters:  04/11/22 110/60  03/28/22 (!) 133/47  03/13/22 139/72   Wt Readings from Last 3 Encounters:04/11/22 150 lb (68 kg) 03/28/22 153 lb 11.2 oz (69.7 kg) 03/13/22 153 lb 3.2 oz (69.5 kg)      Physical Exam Vitals reviewed.  Constitutional:      Appearance: She is well-developed.     Comments: She definitely looks younger than her age of 66.  HENT:     Head: Normocephalic and atraumatic.     Right Ear: External ear normal.     Left Ear: External ear normal.     Nose: Nose normal.  Eyes:     Conjunctiva/sclera: Conjunctivae normal.     Pupils: Pupils are equal, round, and reactive to light.  Neck:     Thyroid: No thyromegaly.  Cardiovascular:     Rate and Rhythm: Normal rate and regular rhythm.     Heart sounds: Murmur heard.     Comments: 3/6 Murmur at RUSB. Pulmonary:     Effort: Pulmonary effort is normal.     Breath sounds: Normal breath sounds.  Abdominal:     Palpations: Abdomen is soft.  Musculoskeletal:     Cervical back: Normal range of motion and neck supple.  Skin:    General: Skin is warm and dry.  Neurological:     General: No focal deficit present.     Mental Status: She is alert and oriented to person, place, and time.     Deep Tendon Reflexes: Reflexes are normal and symmetric.  Psychiatric:        Mood and Affect: Mood normal.        Behavior: Behavior normal.         Thought Content: Thought content normal.        Judgment: Judgment normal.      POCT glycosylated hemoglobin (Hb A1C) Result Value Ref Range Hemoglobin A1C 7.4 (A) 4.0 - 5.6 % Est. average glucose Bld gHb Est-mCnc 166    Assessment & Plan     1. Controlled type 2 diabetes mellitus with other circulatory complication, unspecified whether long term insulin use ([place]) He has gone from 6.7-7.4 in this very active 88 year old.  Not change anything.  I would rather her drift towards an A1c of 8 and then get hypoglycemic on her insulin - POCT glycosylated hemoglobin (Hb A1C)  2. Essential (primary) hypertension Good blood pressure control  3. Angina pectoris ([place]) Chest pain but she does have dyspnea on exertion which I think is probably valvular disease.  4. Nonrheumatic aortic valve stenosis Follow-up with cardiology this summe  5. Coronary artery disease of native artery of native heart with stable angina pectoris ([place]) All risk factors treate  6. Type 2 diabetes mellitus with stage 3b chronic kidney disease, with long-term current use of insulin (HCC) Void anti-inflammatories  7. Mixed hyperlipidemia   8. Malaise and fatigue   9. Hyponatremia    No follow-ups on file.      I, [name], MD, have reviewed all documentation for this visit. The documentation on 04/12/22 for the exam, diagnosis, procedures, and orders are all accurate and complete.    Cruz Devilla Cranford Mon, MD  [facility] [phone] (phone) [phone] (fax)  [place]

## 2022-04-17 ENCOUNTER — Other Ambulatory Visit: Payer: Self-pay | Admitting: Family Medicine

## 2022-04-17 DIAGNOSIS — F419 Anxiety disorder, unspecified: Secondary | ICD-10-CM

## 2022-04-19 ENCOUNTER — Telehealth: Payer: Self-pay | Admitting: Cardiovascular Disease

## 2022-04-19 NOTE — Telephone Encounter (Signed)
Called and spoke with patient's daughter [name], per [facility].   [name] reports that her mother seems to have been hiding her symptoms for several weeks now, due to the face that there was a family wedding out of town and the patient did not want to miss it. At the wedding, the patient was noticeably more short of breath and was only able to walk a maximum of 20 feet before she had to sit down and rest.   At one point on the trip [name] noticed a pill bottle by her mother's leg and when she asked what it was, patient told her that it was her nitro and admitted that she had been having some chest pain, but with the nitro it was better. She then told [name] that when she saw Dr. Rosanna Randy on 5/25, he told her that he felt she needed to see Dr. Rockey Situ.   [name] offered appointment Monday, 6/5 with Dr. Rockey Situ for patient. [name] accepted and voiced appreciation for the call.

## 2022-04-19 NOTE — Telephone Encounter (Signed)
Pt c/o Shortness Of Breath: STAT if SOB developed within the last 24 hours or pt is noticeably SOB on the phone  1. Are you currently SOB (can you hear that pt is SOB on the phone)?  No, only occurs when walking per patient  2. How long have you been experiencing SOB?  Past 3 weeks  3. Are you SOB when sitting or when up moving around?  When up and moving around  4. Are you currently experiencing any other symptoms?  Nausea, chest pressure   Pt c/o of Chest Pain: STAT if CP now or developed within 24 hours  1. Are you having CP right now?  No, only occurs when walking  2. Are you experiencing any other symptoms (ex. SOB, nausea, vomiting, sweating)?   3. How long have you been experiencing CP?  Denies pain, states chest pressure has been going on for the past 3 weeks  4. Is your CP continuous or coming and going?  Coming and going, with minimal exertion  5. Have you taken Nitroglycerin?  Yes, patient's daughter states she took it this past weekend ?

## 2022-04-21 NOTE — Progress Notes (Unsigned)
Date:  04/22/2022   ID:  Jasmine Webster, DOB June 04, 1926, MRN [id number]  Patient Location:  [place] [address]   Provider location:   [facility], [place] office  PCP:  Jerrol Banana., MD  Cardiologist:  [facility]  Chief Complaint  Patient presents with   Chest Pain    Patient c/o shortness of breath & chest pain with walking; symptoms for the past 5 weeks. "I am having difficulty taking a deep breath" Medications reviewed by the patient verbally.     History of Present Illness:    Jasmine Webster is a 88 y.o. female  past medical history of Diabetes type 2, hemoglobin A1c 6.8 GERD Hyperlipidemia Hypertension Arthritis Non smoker Who presents for chest pain on exertion, angina, murmur  Prior Echocardiogram confirming moderate aortic valve stenosis, elevated right heart pressures/pulmonary hypertension  Presents today with family Last seen in clinic by myself August 2022  Echocardiogram September 2022 Normal cardiac function,  moderate, by some measurements moderate to severe aortic valve stenosis  Follow-up today, Reports having worsening shortness of breath, episodes of chest pain, taking nitro as needed Recently went to a wedding, had difficulty walking any distance secondary to shortness of breath  No significant leg swelling, no abdominal distention Denies PND/orthopnea  Blood pressure typically well controlled  Most recent echocardiogram September 2022 Mean gradient 29, peak gradient 51 peak velocity 3.56 m/s  Unable to estimate right heart pressures Prior echocardiogram 2021 with elevated right heart pressures  Cardiac CTA with nonobstructive disease August 2022  Sedentary, legs weak, gait instability Does house chores  Patient is widowed, no smoker  Lab work reviewed HBA1C 6.9   EKG personally reviewed by myself on todays visit Shows normal sinus rhythm rate 70 bpm, left bundle branch  block  Prior CV studies:   The following studies were reviewed today:  Echo March 2020  1. The left ventricle has normal systolic function with an ejection fraction of 60-65%. The cavity size was normal. There is mildly increased left ventricular wall thickness. Left ventricular diastolic Doppler parameters are consistent with impaired  relaxation.  2. The right ventricle has normal systolic function. The cavity was normal. There is no increase in right ventricular wall thickness. Right ventricular systolic pressure is severely elevated with an estimated pressure of 62.0 mmHg.  3. Tricuspid valve regurgitation is mild-moderate.  4. Mmoderate stenosis of the aortic valve. Mean gradient 26 mm Hg   Past Medical History:  Diagnosis Date   Arthritis    Cataract    cataract removal bilaterally approx 20 years ago   Chronic back pain    Coronary artery disease    COVID-19 virus infection 12/2020   Diabetes mellitus without complication (HCC)    Heart murmur    Hyperlipidemia    Hypertension    Past Surgical History:  Procedure Laterality Date   ABDOMINAL HYSTERECTOMY     APPENDECTOMY     BACK SURGERY     three   CARDIAC CATHETERIZATION     6+yrs no stents Dr. Verneda Skill   CATARACT EXTRACTION     THORACOLUMBAR SYRINGO SHUNT     TONSILLECTOMY AND ADENOIDECTOMY       Current Meds  Medication Sig   ALPRAZolam (XANAX) 0.5 MG tablet TAKE 1 TABLET BY MOUTH EVERY DAY AT BEDTIME AS NEEDED   Artificial Tear Ointment (DRY EYES OP) Apply 1 drop to eye daily.   aspirin EC 81  MG tablet Take 81 mg by mouth daily.   co-enzyme Q-10 30 MG capsule Take 30 mg by mouth daily.   furosemide (LASIX) 20 MG tablet TAKE 1 TABLET (20 MG TOTAL) BY MOUTH AS DIRECTED. TAKE 1 TABLET (20 MG) 2-3 TIMES A WEEK   LANTUS SOLOSTAR 100 UNIT/ML Solostar Pen INJECT 25 UNITS SUBCUTANEOUSLY   lisinopril (ZESTRIL) 20 MG tablet Take 1 tablet (20 mg total) by mouth daily.   Magnesium 400 MG CAPS Take 400 mg by  mouth daily.   Multiple Vitamins-Minerals (PRESERVISION AREDS) CAPS Take 1 capsule by mouth 2 (two) times daily.    nitroGLYCERIN (NITROSTAT) 0.4 MG SL tablet Place 1 tablet (0.4 mg total) under the tongue every 5 (five) minutes as needed for chest pain.   Omega-3 Fatty Acids (FISH OIL PO) Take by mouth daily.   omeprazole (PRILOSEC) 20 MG capsule Take 20 mg by mouth daily.   ONETOUCH ULTRA test strip CHECK SUGAR TWICE A DAY   pantoprazole (PROTONIX) 40 MG tablet Take 1 tablet (40 mg total) by mouth 2 (two) times daily before a meal.   potassium chloride (KLOR-CON) 10 MEQ tablet TAKE 1 TABLET BY MOUTH 2-3 TIMES A WEEK WITH FUROSEMIDE AS DIRECTED   Turmeric 500 MG CAPS Take by mouth daily.     Allergies:   Etodolac, Naproxen, Statins, Latex, Terbinafine, and Terbinafine hcl   Social History   Tobacco Use   Smoking status: Never   Smokeless tobacco: Never  Vaping Use   Vaping Use: Never used  Substance Use Topics   Alcohol use: No    Alcohol/week: 0.0 standard drinks   Drug use: No     Family Hx: The patient's family history includes Cancer in her brother and brother; Diabetes in her father; Heart attack in her mother; Heart disease in her mother and sister; Pneumonia in her father; Stroke in her brother, father, and sister; Sudden death in her brother, sister, and sister.  ROS:   Please see the history of present illness.    Review of Systems  Constitutional: Negative.   Respiratory:  Positive for shortness of breath.   Cardiovascular: Negative.   Gastrointestinal: Negative.   Musculoskeletal:  Positive for back pain.  Neurological: Negative.   Psychiatric/Behavioral: Negative.    All other systems reviewed and are negative.   Labs/Other Tests and Data Reviewed:    Recent Labs: 11/26/2021: B Natriuretic Peptide 133.2; Magnesium 2.0; TSH 2.066 03/13/2022: ALT 11; BUN 22; Creatinine, Ser 1.16; Hemoglobin 10.6; Platelets CANCELED; Potassium 4.6; Sodium 136   Recent Lipid  Panel Lab Results  Component Value Date/Time   CHOL 185 01/19/2021 08:05 AM   TRIG 175 (H) 01/19/2021 08:05 AM   HDL 53 01/19/2021 08:05 AM   CHOLHDL 3.5 01/19/2021 08:05 AM   LDLCALC 102 (H) 01/19/2021 08:05 AM    Wt Readings from Last 3 Encounters:  04/22/22 151 lb 2 oz (68.5 kg)  04/11/22 150 lb (68 kg)  03/28/22 153 lb 11.2 oz (69.7 kg)     Exam:    BP (!) 128/50 (BP Location: Left Arm, Patient Position: Sitting, Cuff Size: Normal)   Pulse 70   Ht '5\' 4"'$  (1.626 m)   Wt 151 lb 2 oz (68.5 kg)   SpO2 99%   BMI 25.94 kg/m  Constitutional:  oriented to person, place, and time. No distress.  HENT:  Head: Grossly normal Eyes:  no discharge. No scleral icterus.  Neck: No JVD, no carotid bruits  Cardiovascular: Regular rate and  rhythm, 3/6 systolic ejection murmur appreciated right sternal border radiating to carotids Pulmonary/Chest: Clear to auscultation bilaterally, no wheezes or rails Abdominal: Soft.  no distension.  no tenderness.  Musculoskeletal: Normal range of motion Neurological:  normal muscle tone. Coordination normal. No atrophy Skin: Skin warm and dry Psychiatric: normal affect, pleasant  ASSESSMENT & PLAN:    Aortic valve stenosis, etiology of cardiac valve disease unspecified  Long discussion concerning echocardiogram findings and her worsening shortness of breath symptoms -Discussed various treatment options for aortic valve stenosis including TAVR We recommended right and left heart catheterization given worsening symptoms If catheterization numbers confirm significant stenosis, will refer to [facility] for further evaluation from our valvular heart team  Pulmonary hypertension ([place]) -  Previously noted on echocardiogram Stable renal function, Worsening shortness of breath symptoms she reports She previously declined right heart catheterization but willing to consider a procedure at this time We have scheduled right and left heart catheterization given  worsening symptoms Risk and benefit of procedure was discussed in detail  Hyperlipidemia Was previously on Crestor, appears this is no longer on her list  Hypertension Blood pressure is well controlled on today's visit. No changes made to the medications.  Left bundle branch block Dates back several years   Total encounter time more than 40 minutes  Greater than 50% was spent in counseling and coordination of care with the patient    Signed, Ida Rogue, MD  04/22/2022 2:43 PM    [place] [id number], [place], [place] [address]

## 2022-04-21 NOTE — H&P (View-Only) (Signed)
Date:  04/22/2022   ID:  Jasmine Webster, DOB August 09, 1926, MRN [id number]  Patient Location:  [place] [address]   Provider location:   [facility], [place] office  PCP:  Jerrol Banana., MD  Cardiologist:  Arvid Right [facility]  Chief Complaint  Patient presents with   Chest Pain    Patient c/o shortness of breath & chest pain with walking; symptoms for the past 5 weeks. "I am having difficulty taking a deep breath" Medications reviewed by the patient verbally.     History of Present Illness:    Jasmine Webster is a 88 y.o. female  past medical history of Diabetes type 2, hemoglobin A1c 6.8 GERD Hyperlipidemia Hypertension Arthritis Non smoker Who presents for chest pain on exertion, angina, murmur  Prior Echocardiogram confirming moderate aortic valve stenosis, elevated right heart pressures/pulmonary hypertension  Presents today with family Last seen in clinic by myself August 2022  Echocardiogram September 2022 Normal cardiac function,  moderate, by some measurements moderate to severe aortic valve stenosis  Follow-up today, Reports having worsening shortness of breath, episodes of chest pain, taking nitro as needed Recently went to a wedding, had difficulty walking any distance secondary to shortness of breath  No significant leg swelling, no abdominal distention Denies PND/orthopnea  Blood pressure typically well controlled  Most recent echocardiogram September 2022 Mean gradient 29, peak gradient 51 peak velocity 3.56 m/s  Unable to estimate right heart pressures Prior echocardiogram 2021 with elevated right heart pressures  Cardiac CTA with nonobstructive disease August 2022  Sedentary, legs weak, gait instability Does house chores  Patient is widowed, no smoker  Lab work reviewed HBA1C 6.9   EKG personally reviewed by myself on todays visit Shows normal sinus rhythm rate 70 bpm, left bundle branch  block  Prior CV studies:   The following studies were reviewed today:  Echo March 2020  1. The left ventricle has normal systolic function with an ejection fraction of 60-65%. The cavity size was normal. There is mildly increased left ventricular wall thickness. Left ventricular diastolic Doppler parameters are consistent with impaired  relaxation.  2. The right ventricle has normal systolic function. The cavity was normal. There is no increase in right ventricular wall thickness. Right ventricular systolic pressure is severely elevated with an estimated pressure of 62.0 mmHg.  3. Tricuspid valve regurgitation is mild-moderate.  4. Mmoderate stenosis of the aortic valve. Mean gradient 26 mm Hg   Past Medical History:  Diagnosis Date   Arthritis    Cataract    cataract removal bilaterally approx 20 years ago   Chronic back pain    Coronary artery disease    COVID-19 virus infection 12/2020   Diabetes mellitus without complication (HCC)    Heart murmur    Hyperlipidemia    Hypertension    Past Surgical History:  Procedure Laterality Date   ABDOMINAL HYSTERECTOMY     APPENDECTOMY     BACK SURGERY     three   CARDIAC CATHETERIZATION     6+yrs no stents Dr. Verneda Skill   CATARACT EXTRACTION     THORACOLUMBAR SYRINGO SHUNT     TONSILLECTOMY AND ADENOIDECTOMY       Current Meds  Medication Sig   ALPRAZolam (XANAX) 0.5 MG tablet TAKE 1 TABLET BY MOUTH EVERY DAY AT BEDTIME AS NEEDED   Artificial Tear Ointment (DRY EYES OP) Apply 1 drop to eye daily.   aspirin EC 81  MG tablet Take 81 mg by mouth daily.   co-enzyme Q-10 30 MG capsule Take 30 mg by mouth daily.   furosemide (LASIX) 20 MG tablet TAKE 1 TABLET (20 MG TOTAL) BY MOUTH AS DIRECTED. TAKE 1 TABLET (20 MG) 2-3 TIMES A WEEK   LANTUS SOLOSTAR 100 UNIT/ML Solostar Pen INJECT 25 UNITS SUBCUTANEOUSLY   lisinopril (ZESTRIL) 20 MG tablet Take 1 tablet (20 mg total) by mouth daily.   Magnesium 400 MG CAPS Take 400 mg by  mouth daily.   Multiple Vitamins-Minerals (PRESERVISION AREDS) CAPS Take 1 capsule by mouth 2 (two) times daily.    nitroGLYCERIN (NITROSTAT) 0.4 MG SL tablet Place 1 tablet (0.4 mg total) under the tongue every 5 (five) minutes as needed for chest pain.   Omega-3 Fatty Acids (FISH OIL PO) Take by mouth daily.   omeprazole (PRILOSEC) 20 MG capsule Take 20 mg by mouth daily.   ONETOUCH ULTRA test strip CHECK SUGAR TWICE A DAY   pantoprazole (PROTONIX) 40 MG tablet Take 1 tablet (40 mg total) by mouth 2 (two) times daily before a meal.   potassium chloride (KLOR-CON) 10 MEQ tablet TAKE 1 TABLET BY MOUTH 2-3 TIMES A WEEK WITH FUROSEMIDE AS DIRECTED   Turmeric 500 MG CAPS Take by mouth daily.     Allergies:   Etodolac, Naproxen, Statins, Latex, Terbinafine, and Terbinafine hcl   Social History   Tobacco Use   Smoking status: Never   Smokeless tobacco: Never  Vaping Use   Vaping Use: Never used  Substance Use Topics   Alcohol use: No    Alcohol/week: 0.0 standard drinks   Drug use: No     Family Hx: The patient's family history includes Cancer in her brother and brother; Diabetes in her father; Heart attack in her mother; Heart disease in her mother and sister; Pneumonia in her father; Stroke in her brother, father, and sister; Sudden death in her brother, sister, and sister.  ROS:   Please see the history of present illness.    Review of Systems  Constitutional: Negative.   Respiratory:  Positive for shortness of breath.   Cardiovascular: Negative.   Gastrointestinal: Negative.   Musculoskeletal:  Positive for back pain.  Neurological: Negative.   Psychiatric/Behavioral: Negative.    All other systems reviewed and are negative.   Labs/Other Tests and Data Reviewed:    Recent Labs: 11/26/2021: B Natriuretic Peptide 133.2; Magnesium 2.0; TSH 2.066 03/13/2022: ALT 11; BUN 22; Creatinine, Ser 1.16; Hemoglobin 10.6; Platelets CANCELED; Potassium 4.6; Sodium 136   Recent Lipid  Panel Lab Results  Component Value Date/Time   CHOL 185 01/19/2021 08:05 AM   TRIG 175 (H) 01/19/2021 08:05 AM   HDL 53 01/19/2021 08:05 AM   CHOLHDL 3.5 01/19/2021 08:05 AM   LDLCALC 102 (H) 01/19/2021 08:05 AM    Wt Readings from Last 3 Encounters:  04/22/22 151 lb 2 oz (68.5 kg)  04/11/22 150 lb (68 kg)  03/28/22 153 lb 11.2 oz (69.7 kg)     Exam:    BP (!) 128/50 (BP Location: Left Arm, Patient Position: Sitting, Cuff Size: Normal)   Pulse 70   Ht '5\' 4"'$  (1.626 m)   Wt 151 lb 2 oz (68.5 kg)   SpO2 99%   BMI 25.94 kg/m  Constitutional:  oriented to person, place, and time. No distress.  HENT:  Head: Grossly normal Eyes:  no discharge. No scleral icterus.  Neck: No JVD, no carotid bruits  Cardiovascular: Regular rate and  rhythm, 3/6 systolic ejection murmur appreciated right sternal border radiating to carotids Pulmonary/Chest: Clear to auscultation bilaterally, no wheezes or rails Abdominal: Soft.  no distension.  no tenderness.  Musculoskeletal: Normal range of motion Neurological:  normal muscle tone. Coordination normal. No atrophy Skin: Skin warm and dry Psychiatric: normal affect, pleasant  ASSESSMENT & PLAN:    Aortic valve stenosis, etiology of cardiac valve disease unspecified  Long discussion concerning echocardiogram findings and her worsening shortness of breath symptoms -Discussed various treatment options for aortic valve stenosis including TAVR We recommended right and left heart catheterization given worsening symptoms If catheterization numbers confirm significant stenosis, will refer to [facility] for further evaluation from our valvular heart team  Pulmonary hypertension ([place]) -  Previously noted on echocardiogram Stable renal function, Worsening shortness of breath symptoms she reports She previously declined right heart catheterization but willing to consider a procedure at this time We have scheduled right and left heart catheterization given  worsening symptoms Risk and benefit of procedure was discussed in detail  Hyperlipidemia Was previously on Crestor, appears this is no longer on her list  Hypertension Blood pressure is well controlled on today's visit. No changes made to the medications.  Left bundle branch block Dates back several years   Total encounter time more than 40 minutes  Greater than 50% was spent in counseling and coordination of care with the patient    Signed, Ida Rogue, MD  04/22/2022 2:43 PM    [place] [facility] [address], [place], [place] [address]

## 2022-04-22 ENCOUNTER — Telehealth: Payer: Self-pay | Admitting: Family Medicine

## 2022-04-22 ENCOUNTER — Other Ambulatory Visit: Payer: Self-pay | Admitting: Cardiovascular Disease

## 2022-04-22 ENCOUNTER — Ambulatory Visit: Payer: PPO | Admitting: Cardiovascular Disease

## 2022-04-22 ENCOUNTER — Encounter: Payer: Self-pay | Admitting: Cardiovascular Disease

## 2022-04-22 ENCOUNTER — Other Ambulatory Visit
Admission: RE | Admit: 2022-04-22 | Discharge: 2022-04-22 | Disposition: A | Discharge status: Home / Self Care | Payer: PPO | Source: Ambulatory Visit | Attending: Cardiovascular Disease | Admitting: Cardiovascular Disease

## 2022-04-22 VITALS — BP 128/50 | HR 70 | Ht 64.0 in | Wt 151.1 lb

## 2022-04-22 DIAGNOSIS — I272 Pulmonary hypertension, unspecified: Secondary | ICD-10-CM

## 2022-04-22 DIAGNOSIS — E118 Type 2 diabetes mellitus with unspecified complications: Secondary | ICD-10-CM

## 2022-04-22 DIAGNOSIS — I10 Essential (primary) hypertension: Secondary | ICD-10-CM | POA: Diagnosis not present

## 2022-04-22 DIAGNOSIS — I350 Nonrheumatic aortic (valve) stenosis: Secondary | ICD-10-CM | POA: Diagnosis not present

## 2022-04-22 DIAGNOSIS — R0602 Shortness of breath: Secondary | ICD-10-CM

## 2022-04-22 DIAGNOSIS — E782 Mixed hyperlipidemia: Secondary | ICD-10-CM

## 2022-04-22 DIAGNOSIS — Z794 Long term (current) use of insulin: Secondary | ICD-10-CM | POA: Diagnosis not present

## 2022-04-22 DIAGNOSIS — I209 Angina pectoris, unspecified: Secondary | ICD-10-CM

## 2022-04-22 LAB — CBC
HCT: 28.6 % — ABNORMAL LOW (ref 36.0–46.0)
Hemoglobin: 8.7 g/dL — ABNORMAL LOW (ref 12.0–15.0)
MCH: 27.4 pg (ref 26.0–34.0)
MCHC: 30.4 g/dL (ref 30.0–36.0)
MCV: 90.2 fL (ref 80.0–100.0)
Platelets: 284 10*3/uL (ref 150–400)
RBC: 3.17 MIL/uL — ABNORMAL LOW (ref 3.87–5.11)
RDW: 14 % (ref 11.5–15.5)
WBC: 6.3 10*3/uL (ref 4.0–10.5)
nRBC: 0 % (ref 0.0–0.2)

## 2022-04-22 LAB — BASIC METABOLIC PANEL
Anion gap: 7 (ref 5–15)
BUN: 19 mg/dL (ref 8–23)
CO2: 25 mmol/L (ref 22–32)
Calcium: 8.7 mg/dL — ABNORMAL LOW (ref 8.9–10.3)
Chloride: 103 mmol/L (ref 98–111)
Creatinine, Ser: 1.02 mg/dL — ABNORMAL HIGH (ref 0.44–1.00)
GFR, Estimated: 51 mL/min — ABNORMAL LOW (ref >60)
Glucose, Bld: 155 mg/dL — ABNORMAL HIGH (ref 70–99)
Potassium: 4.4 mmol/L (ref 3.5–5.1)
Sodium: 135 mmol/L (ref 135–145)

## 2022-04-22 MED ORDER — ONDANSETRON HCL 4 MG PO TABS: ORAL_TABLET | ORAL | 1 refills | Status: DC

## 2022-04-22 NOTE — Telephone Encounter (Signed)
Pt is calling to ask Dr. Rosanna Randy about a creme pain medication. Pt would like to know did Dr. Rosanna Randy call the medication in? [facility]- [phone]

## 2022-04-22 NOTE — Patient Instructions (Signed)
Medication Instructions:  No changes  If you need a refill on your cardiac medications before your next appointment, please call your pharmacy.   Lab work: Today: CBC, BMP   Medical Mall Entrance at [facility] 1st desk on the right to check in (REGISTRATION)  Lab hours: Monday- Friday (7:30 am- 5:30 pm)   Testing/Procedures:  You are scheduled for a Cardiac Catheterization on Friday, June 9 with Dr. [name].  1. Please arrive at the [place] at [address], [place], [place] [address] at 9:30 AM (This time is one hour before your procedure to ensure your preparation). Free valet parking service is available.   Special note: Every effort is made to have your procedure done on time. Please understand that emergencies sometimes delay scheduled procedures.  2. Diet: Do not eat solid foods after midnight.  You may have clear liquids until 5 AM upon the day of the procedure.  3. Labs: You will need to have blood drawn on Monday, June 5 at [facility], Go to 1st desk on your right to register.  Address: [place],  [address]  Open: 8am - 5pm  Phone: [phone]. You do not need to be fasting.  4. Medication instructions in preparation for your procedure:   Contrast Allergy: No  Take only 13 units of insulin the night before your procedure. Do not take any insulin on the day of the procedure.   On the morning of your procedure, take Aspirin and any morning medicines NOT listed above.  You may use sips of water.  5. Plan to go home the same day, you will only stay overnight if medically necessary. 6. You MUST have a responsible adult to drive you home. 7. An adult MUST be with you the first 24 hours after you arrive home. 8. Bring a current list of your medications, and the last time and date medication taken. 9. Bring ID and current insurance cards. 10.Please wear clothes that are easy to get on and off and wear slip-on  shoes.  Thank you for allowing [name] to care for you!   -- [facility]   Follow-Up: At [facility], you and your health needs are our priority.  As part of our continuing mission to provide you with exceptional heart care, we have created designated Provider Care Teams.  These Care Teams include your primary Cardiologist (physician) and Advanced Practice Providers (APPs -  Physician Assistants and Nurse Practitioners) who all work together to provide you with the care you need, when you need it.  You will need a follow up appointment in 1 month  Providers on your designated Care Team:   Murray Hodgkins, NP Christell Faith, PA-C Cadence Kathlen Mody, [place]  COVID-19 Vaccine Information can be found at: [email or web address] For questions related to vaccine distribution or appointments, please email [email or web address] or call [phone].

## 2022-04-22 NOTE — Telephone Encounter (Signed)
Please advise pain cream? There is no note in chart concerning this.

## 2022-04-23 ENCOUNTER — Telehealth: Payer: Self-pay | Admitting: Cardiovascular Disease

## 2022-04-23 NOTE — Telephone Encounter (Signed)
Patient calling in to cancel her procedure on Friday. Please advise

## 2022-04-23 NOTE — Telephone Encounter (Signed)
Called and spoke with patient.   Patient stated that she needs to cancel it because it's just "causing too much trouble." Per patient, son is upset and granddaughter is very worried, and she states that even if the cath shows that she may need a TAVR, her son "would have a fit" if they talked about putting her to sleep. Family is very concerned that something will happen to her.   Asked patient to make sure she understands that without the cath, we wouldn't have any more information to help [name] treat her shortness of breath. Pt states she does understand and that she's doing pretty good for being 89, almost 89 and she's ok.   Advised patient that should she change her mind or need anything, she can always call our office. Pt verbalized understanding and voiced appreciation for the call.   After completing call, spoke with Dr. Rockey Situ face-to-face to advise him that patient is cancelling cath. Dr. Rockey Situ acknowledged with no further orders.   Scheduling called to cancel.

## 2022-04-24 ENCOUNTER — Telehealth: Payer: Self-pay | Admitting: Cardiovascular Disease

## 2022-04-24 NOTE — Telephone Encounter (Signed)
New Message:     Patient said she had cx her  procedure for Friday(04-26-22). She would like to reschedule it back for Friday(04-26-22)

## 2022-04-24 NOTE — Telephone Encounter (Signed)
Called to let patient know that she was back on the schedule for her right and left cath on 6/9 at 10:30 with a 9:30 arrival time Pt verbalized understanding and voiced appreciation for the assistance.

## 2022-04-24 NOTE — Addendum Note (Signed)
Addended by: Anselm Pancoast on: 04/24/2022 11:24 AM   Modules accepted: Orders

## 2022-04-26 ENCOUNTER — Encounter: Admission: RE | Payer: Self-pay | Source: Home / Self Care

## 2022-04-26 ENCOUNTER — Encounter: Payer: Self-pay | Admitting: Cardiovascular Disease

## 2022-04-26 ENCOUNTER — Ambulatory Visit: Admission: RE | Admit: 2022-04-26 | Payer: PPO | Source: Home / Self Care | Admitting: Cardiovascular Disease

## 2022-04-26 ENCOUNTER — Ambulatory Visit
Admission: RE | Admit: 2022-04-26 | Discharge: 2022-04-26 | Disposition: A | Discharge status: Home / Self Care | Payer: PPO | Attending: Cardiovascular Disease | Admitting: Cardiovascular Disease

## 2022-04-26 ENCOUNTER — Encounter
Admission: RE | Disposition: A | Discharge status: Home / Self Care | Payer: Self-pay | Source: Home / Self Care | Attending: Cardiovascular Disease

## 2022-04-26 ENCOUNTER — Other Ambulatory Visit: Payer: Self-pay

## 2022-04-26 DIAGNOSIS — E119 Type 2 diabetes mellitus without complications: Secondary | ICD-10-CM | POA: Insufficient documentation

## 2022-04-26 DIAGNOSIS — K219 Gastro-esophageal reflux disease without esophagitis: Secondary | ICD-10-CM | POA: Diagnosis not present

## 2022-04-26 DIAGNOSIS — I10 Essential (primary) hypertension: Secondary | ICD-10-CM | POA: Insufficient documentation

## 2022-04-26 DIAGNOSIS — I272 Pulmonary hypertension, unspecified: Secondary | ICD-10-CM

## 2022-04-26 DIAGNOSIS — I209 Angina pectoris, unspecified: Secondary | ICD-10-CM | POA: Diagnosis not present

## 2022-04-26 DIAGNOSIS — E785 Hyperlipidemia, unspecified: Secondary | ICD-10-CM | POA: Insufficient documentation

## 2022-04-26 DIAGNOSIS — I447 Left bundle-branch block, unspecified: Secondary | ICD-10-CM | POA: Insufficient documentation

## 2022-04-26 DIAGNOSIS — Z794 Long term (current) use of insulin: Secondary | ICD-10-CM | POA: Insufficient documentation

## 2022-04-26 DIAGNOSIS — I2510 Atherosclerotic heart disease of native coronary artery without angina pectoris: Secondary | ICD-10-CM

## 2022-04-26 DIAGNOSIS — I350 Nonrheumatic aortic (valve) stenosis: Secondary | ICD-10-CM | POA: Diagnosis not present

## 2022-04-26 HISTORY — PX: RIGHT/LEFT HEART CATH AND CORONARY ANGIOGRAPHY: CATH118266

## 2022-04-26 LAB — GLUCOSE, CAPILLARY
Glucose-Capillary: 156 mg/dL — ABNORMAL HIGH (ref 70–99)
Glucose-Capillary: 152 mg/dL — ABNORMAL HIGH (ref 70–99)

## 2022-04-26 SURGERY — RIGHT AND LEFT HEART CATH
Anesthesia: Moderate Sedation

## 2022-04-26 SURGERY — RIGHT/LEFT HEART CATH AND CORONARY ANGIOGRAPHY
Anesthesia: Moderate Sedation | Laterality: Bilateral

## 2022-04-26 SURGERY — RIGHT/LEFT HEART CATH AND CORONARY ANGIOGRAPHY
Anesthesia: Moderate Sedation

## 2022-04-26 MED ORDER — IOHEXOL 300 MG/ML  SOLN
INTRAMUSCULAR | Status: DC | PRN
  Administered 2022-04-26: 35 mL

## 2022-04-26 MED ORDER — SODIUM CHLORIDE 0.9% FLUSH
3.0000 mL | INTRAVENOUS | Status: DC | PRN
Start: 2022-04-26 — End: 2022-04-26

## 2022-04-26 MED ORDER — ACETAMINOPHEN 325 MG PO TABS: 650.0000 mg | ORAL_TABLET | ORAL | Status: DC | PRN

## 2022-04-26 MED ORDER — ONDANSETRON HCL 4 MG/2ML IJ SOLN: 4.0000 mg | Freq: Four times a day (QID) | INTRAMUSCULAR | Status: DC | PRN

## 2022-04-26 MED ORDER — ASPIRIN 81 MG PO CHEW: 81.0000 mg | CHEWABLE_TABLET | ORAL | Status: DC

## 2022-04-26 MED ORDER — SODIUM CHLORIDE 0.9 % IV SOLN: 250.0000 mL | INTRAVENOUS | Status: DC | PRN

## 2022-04-26 MED ORDER — LIDOCAINE HCL 1 % IJ SOLN
INTRAMUSCULAR | Status: AC
  Filled 2022-04-26: qty 20

## 2022-04-26 MED ORDER — HEPARIN SODIUM (PORCINE) 1000 UNIT/ML IJ SOLN
INTRAMUSCULAR | Status: AC
  Filled 2022-04-26: qty 10

## 2022-04-26 MED ORDER — SODIUM CHLORIDE 0.9 % IV SOLN: INTRAVENOUS | Status: DC

## 2022-04-26 MED ORDER — SODIUM CHLORIDE 0.9% FLUSH: 3.0000 mL | Freq: Two times a day (BID) | INTRAVENOUS | Status: DC

## 2022-04-26 MED ORDER — HEPARIN SODIUM (PORCINE) 1000 UNIT/ML IJ SOLN
INTRAMUSCULAR | Status: DC | PRN
  Administered 2022-04-26: 3000 [IU] via INTRAVENOUS

## 2022-04-26 MED ORDER — MIDAZOLAM HCL 2 MG/2ML IJ SOLN
INTRAMUSCULAR | Status: AC
  Filled 2022-04-26: qty 2

## 2022-04-26 MED ORDER — VERAPAMIL HCL 2.5 MG/ML IV SOLN
INTRAVENOUS | Status: AC
  Filled 2022-04-26: qty 2

## 2022-04-26 MED ORDER — FENTANYL CITRATE (PF) 100 MCG/2ML IJ SOLN
INTRAMUSCULAR | Status: AC
  Filled 2022-04-26: qty 2

## 2022-04-26 MED ORDER — LIDOCAINE HCL (PF) 1 % IJ SOLN
INTRAMUSCULAR | Status: DC | PRN
  Administered 2022-04-26 (×2): 2 mL

## 2022-04-26 MED ORDER — HEPARIN (PORCINE) IN NACL 1000-0.9 UT/500ML-% IV SOLN
INTRAVENOUS | Status: AC
  Filled 2022-04-26: qty 1000

## 2022-04-26 MED ORDER — VERAPAMIL HCL 2.5 MG/ML IV SOLN
INTRAVENOUS | Status: DC | PRN
  Administered 2022-04-26: 2.5 mg via INTRA_ARTERIAL

## 2022-04-26 MED ORDER — HEPARIN (PORCINE) IN NACL 1000-0.9 UT/500ML-% IV SOLN
INTRAVENOUS | Status: DC | PRN
  Administered 2022-04-26 (×2): 500 mL

## 2022-04-26 MED ORDER — SODIUM CHLORIDE 0.9 % WEIGHT BASED INFUSION: 1.0000 mL/kg/h | INTRAVENOUS | Status: DC

## 2022-04-26 MED ORDER — SODIUM CHLORIDE 0.9 % WEIGHT BASED INFUSION
3.0000 mL/kg/h | INTRAVENOUS | Status: AC
  Administered 2022-04-26: 3 mL/kg/h via INTRAVENOUS

## 2022-04-26 SURGICAL SUPPLY — 12 items
BAND CMPR LRG ZPHR (HEMOSTASIS) ×1
BAND ZEPHYR COMPRESS 30 LONG (HEMOSTASIS) ×2
CATH 5F 110X4 TIG (CATHETERS) ×2
CATH BALLN WEDGE 5F 110CM (CATHETERS) ×2
DRAPE BRACHIAL (DRAPES) ×4
GLIDESHEATH SLEND SS 6F .021 (SHEATH) ×2
GUIDEWIRE EMER 3M J .025X150CM (WIRE) ×2
INQWIRE 1.5J .035X260CM (WIRE) ×2
PACK CARDIAC CATH (CUSTOM PROCEDURE TRAY) ×2
PROTECTION STATION PRESSURIZED (MISCELLANEOUS) ×2
SET ATX SIMPLICITY (MISCELLANEOUS) ×2
SHEATH GLIDE SLENDER 4/5FR (SHEATH) ×2

## 2022-04-26 NOTE — Interval H&P Note (Signed)
History and Physical Interval Note:  04/26/2022 10:46 AM  [name]  has presented today for surgery, with the diagnosis of RT LT Heart Cath   Pulmonary hypertension  Aortic Valve Stenosis.  The various methods of treatment have been discussed with the patient and family. After consideration of risks, benefits and other options for treatment, the patient has consented to  Procedure(s): RIGHT/LEFT HEART CATH AND CORONARY ANGIOGRAPHY (N/A) as a surgical intervention.  The patient's history has been reviewed, patient examined, no change in status, stable for surgery.  I have reviewed the patient's chart and labs.  Questions were answered to the patient's satisfaction.     [name]

## 2022-04-29 ENCOUNTER — Other Ambulatory Visit: Payer: Self-pay

## 2022-04-29 DIAGNOSIS — I350 Nonrheumatic aortic (valve) stenosis: Secondary | ICD-10-CM

## 2022-04-29 DIAGNOSIS — R0602 Shortness of breath: Secondary | ICD-10-CM

## 2022-04-29 NOTE — Telephone Encounter (Signed)
Daughter advised.

## 2022-05-06 ENCOUNTER — Encounter: Payer: Self-pay | Admitting: Cardiovascular Disease

## 2022-05-07 NOTE — Progress Notes (Unsigned)
Date:  05/08/2022   ID:  Jasmine Webster, DOB 11-27-1925, MRN [id number]  Patient Location:  [place] [address]   Provider location:   [facility], [place] office  PCP:  Jerrol Banana., MD  Cardiologist:  Arvid Right [facility]  Chief Complaint  Patient presents with   Follow up from cardiac cath    Patient c/o shortness of breath with walking a short distance. Medications reviewed by the patient verbally.     History of Present Illness:    Jasmine Webster is a 88 y.o. female  past medical history of Diabetes type 2, hemoglobin A1c 6.8 GERD Hyperlipidemia Hypertension Arthritis Non smoker Who presents for chest pain on exertion, angina, aortic valve stenosis  Last seen in clinic by myself April 22, 2022 On that visit reported worsening shortness of breath, some chest tightness on overexertion Family was concerned that she was unable to walk a good distance at a recent wedding  Underwent recent left heart catheterization April 26, 2022 out of concern for underlying ischemia and needing evaluation of her aortic valve Catheterization showed nonobstructive disease Severe aortic valve stenosis with mean gradient 40 mmHg recorded Severe pulmonary hypertension on right heart catheterization Was referred to TAVR clinic  She has appointment in 2 days time in [facility] TAVR clinic  Presents in wheelchair today with family, additional family calling in on the telephone Continues to have shortness of breath on exertion, some chest tightness with over exerting herself Takes Lasix every other day denies leg swelling, no PND orthopnea Blood pressure stable Recovered well from catheterization  EKG personally reviewed by myself on todays visit Normal sinus rhythm rate 71 bpm left bundle branch block  Prior studies reviewed echocardiogram September 2022 Mean gradient 29, peak gradient 51 peak velocity 3.56 m/s  Unable to estimate right  heart pressures Prior echocardiogram 2021 with elevated right heart pressures  Cardiac CTA with nonobstructive disease August 2022  Patient is widowed, no smoker  Lab work reviewed HBA1C 6.9   Echo March 2020  1. The left ventricle has normal systolic function with an ejection fraction of 60-65%. The cavity size was normal. There is mildly increased left ventricular wall thickness. Left ventricular diastolic Doppler parameters are consistent with impaired  relaxation.  2. The right ventricle has normal systolic function. The cavity was normal. There is no increase in right ventricular wall thickness. Right ventricular systolic pressure is severely elevated with an estimated pressure of 62.0 mmHg.  3. Tricuspid valve regurgitation is mild-moderate.  4. Mmoderate stenosis of the aortic valve. Mean gradient 26 mm Hg   Past Medical History:  Diagnosis Date   Arthritis    Cataract    cataract removal bilaterally approx 20 years ago   Chronic back pain    Coronary artery disease    COVID-19 virus infection 12/2020   Diabetes mellitus without complication (HCC)    Heart murmur    Hyperlipidemia    Hypertension    Past Surgical History:  Procedure Laterality Date   ABDOMINAL HYSTERECTOMY     APPENDECTOMY     BACK SURGERY     three   CARDIAC CATHETERIZATION     6+yrs no stents Dr. Verneda Skill   CATARACT EXTRACTION     RIGHT/LEFT HEART CATH AND CORONARY ANGIOGRAPHY N/A 04/26/2022   Procedure: RIGHT/LEFT HEART CATH AND CORONARY ANGIOGRAPHY;  Surgeon: Wellington Hampshire, MD;  Location: [facility];  Service: Cardiovascular;  Laterality:  N/A;   THORACOLUMBAR SYRINGO SHUNT     TONSILLECTOMY AND ADENOIDECTOMY       Current Meds  Medication Sig   ALPRAZolam (XANAX) 0.5 MG tablet TAKE 1 TABLET BY MOUTH EVERY DAY AT BEDTIME AS NEEDED   Artificial Tear Ointment (DRY EYES OP) Apply 1 drop to eye daily.   aspirin EC 81 MG tablet Take 81 mg by mouth daily.   co-enzyme Q-10 30  MG capsule Take 30 mg by mouth daily.   furosemide (LASIX) 20 MG tablet TAKE 1 TABLET (20 MG TOTAL) BY MOUTH AS DIRECTED. TAKE 1 TABLET (20 MG) 2-3 TIMES A WEEK   LANTUS SOLOSTAR 100 UNIT/ML Solostar Pen INJECT 25 UNITS SUBCUTANEOUSLY   lisinopril (ZESTRIL) 20 MG tablet Take 1 tablet (20 mg total) by mouth daily.   Magnesium 400 MG CAPS Take 400 mg by mouth daily.   Multiple Vitamins-Minerals (PRESERVISION AREDS) CAPS Take 1 capsule by mouth 2 (two) times daily.    nitroGLYCERIN (NITROSTAT) 0.4 MG SL tablet Place 1 tablet (0.4 mg total) under the tongue every 5 (five) minutes as needed for chest pain.   Omega-3 Fatty Acids (FISH OIL PO) Take by mouth daily.   omeprazole (PRILOSEC) 20 MG capsule Take 20 mg by mouth daily.   ondansetron (ZOFRAN) 4 MG tablet TAKE 1 TABLET BY MOUTH EVERY 8 HOURS AS NEEDED FOR NAUSEA AND VOMITING   pantoprazole (PROTONIX) 40 MG tablet Take 1 tablet (40 mg total) by mouth 2 (two) times daily before a meal.   potassium chloride (KLOR-CON) 10 MEQ tablet TAKE 1 TABLET BY MOUTH 2-3 TIMES A WEEK WITH FUROSEMIDE AS DIRECTED   Turmeric 500 MG CAPS Take by mouth daily.   vitamin B-12 (CYANOCOBALAMIN) 500 MCG tablet Take 500 mcg by mouth daily.     Allergies:   Etodolac, Naproxen, Statins, Latex, Terbinafine, and Terbinafine hcl   Social History   Tobacco Use   Smoking status: Never   Smokeless tobacco: Never  Vaping Use   Vaping Use: Never used  Substance Use Topics   Alcohol use: No    Alcohol/week: 0.0 standard drinks of alcohol   Drug use: No     Family Hx: The patient's family history includes Cancer in her brother and brother; Diabetes in her father; Heart attack in her mother; Heart disease in her mother and sister; Pneumonia in her father; Stroke in her brother, father, and sister; Sudden death in her brother, sister, and sister.  ROS:   Please see the history of present illness.    Review of Systems  Constitutional: Negative.   Respiratory:  Positive  for shortness of breath.   Cardiovascular: Negative.   Gastrointestinal: Negative.   Musculoskeletal:  Positive for back pain.  Neurological: Negative.   Psychiatric/Behavioral: Negative.    All other systems reviewed and are negative.    Labs/Other Tests and Data Reviewed:    Recent Labs: 11/26/2021: B Natriuretic Peptide 133.2; Magnesium 2.0; TSH 2.066 03/13/2022: ALT 11 04/22/2022: BUN 19; Creatinine, Ser 1.02; Hemoglobin 8.7; Platelets 284; Potassium 4.4; Sodium 135   Recent Lipid Panel Lab Results  Component Value Date/Time   CHOL 185 01/19/2021 08:05 AM   TRIG 175 (H) 01/19/2021 08:05 AM   HDL 53 01/19/2021 08:05 AM   CHOLHDL 3.5 01/19/2021 08:05 AM   LDLCALC 102 (H) 01/19/2021 08:05 AM    Wt Readings from Last 3 Encounters:  05/08/22 151 lb 6 oz (68.7 kg)  04/26/22 151 lb (68.5 kg)  04/22/22 151 lb 2  oz (68.5 kg)     Exam:    BP (!) 120/50 (BP Location: Left Arm, Patient Position: Sitting, Cuff Size: Normal)   Pulse 71   Ht 5' 4.5" (1.638 m)   Wt 151 lb 6 oz (68.7 kg)   SpO2 99%   BMI 25.58 kg/m  Constitutional:  oriented to person, place, and time. No distress.  HENT:  Head: Grossly normal Eyes:  no discharge. No scleral icterus.  Neck: No JVD, no carotid bruits  Cardiovascular: Regular rate and rhythm, 3/6 systolic ejection murmur right sternal border Pulmonary/Chest: Clear to auscultation bilaterally, no wheezes or rails Abdominal: Soft.  no distension.  no tenderness.  Musculoskeletal: Normal range of motion Neurological:  normal muscle tone. Coordination normal. No atrophy Skin: Skin warm and dry Psychiatric: normal affect, pleasant   ASSESSMENT & PLAN:    Aortic valve stenosis, etiology of cardiac valve disease unspecified  Severe aortic valve stenosis on catheterization Nonobstructive coronary disease Suspect her shortness of breath and chest tightness on overexertion is exacerbated by aortic valve stenosis and pulmonary hypertension She has  appointment with TAVR team later this week Recommend she continue her current medications I did contemplate starting low-dose beta-blocker, will defer to TAVR team  Pulmonary hypertension ([place]) -  Previously noted on echocardiogram and on cardiac catheterization Likely exacerbated by aortic valve disease Stable renal function, on Lasix every other day We will need close follow-up after potential TAVR If pressures remain high, would benefit from advanced heart failure clinic evaluation  Hyperlipidemia Currently not on a statin  Hypertension Continue current medications, we did contemplate adding low-dose beta-blocker But no changes today  Left bundle branch block Dates back several years Nonobstructive coronary disease on catheterization   Total encounter time more than 40 minutes  Greater than 50% was spent in counseling and coordination of care with the patient    Signed, Ida Rogue, MD  05/08/2022 11:57 AM    [place] Office [address] [place] [address], [place], [place] [address]

## 2022-05-08 ENCOUNTER — Encounter: Payer: Self-pay | Admitting: Cardiovascular Disease

## 2022-05-08 ENCOUNTER — Ambulatory Visit: Payer: PPO | Admitting: Cardiovascular Disease

## 2022-05-08 VITALS — BP 120/50 | HR 71 | Ht 64.5 in | Wt 151.4 lb

## 2022-05-08 DIAGNOSIS — I350 Nonrheumatic aortic (valve) stenosis: Secondary | ICD-10-CM

## 2022-05-08 NOTE — Patient Instructions (Signed)
Medication Instructions:  No changes  If you need a refill on your cardiac medications before your next appointment, please call your pharmacy.    Lab work: No new labs needed   Testing/Procedures: No new testing needed   Follow-Up: At [facility], you and your health needs are our priority.  As part of our continuing mission to provide you with exceptional heart care, we have created designated Provider Care Teams.  These Care Teams include your primary Cardiologist (physician) and Advanced Practice Providers (APPs -  Physician Assistants and Nurse Practitioners) who all work together to provide you with the care you need, when you need it.  You will need a follow up appointment in 3 months  Providers on your designated Care Team:   Christopher Berge, NP Ryan Dunn, PA-C Cadence Furth, PA-C  COVID-19 Vaccine Information can be found at: [email or web address] For questions related to vaccine distribution or appointments, please email [email or web address] or call [phone].   

## 2022-05-09 NOTE — Progress Notes (Signed)
Patient ID: Jasmine Webster MRN: [id number] DOB/AGE: 20-Sep-1926 88 y.o.  Primary Care Physician:Gilbert, Leonette Monarch., MD Primary Cardiologist: Julien Nordmann, MD   FOCUSED CARDIOVASCULAR PROBLEM LIST:   1.  Moderate to severe aortic stenosis with aortic valve area of 0.68 cm, a dimensionless index of 0.28, peak velocity 3.5, and mean gradient of 29 mmHg with a normal ejection fraction; EKG demonstrates left bundle branch block 2.  Hypertension 3.  Hyperlipidemia 4.  Type 2 diabetes controlled by diet 5.  Pulmonary venous hypertension with a mean PA pressure of 46 mmHg and a pulmonary vascular resistance of 2.6 Woods units 6.  Frailty and ambulates with a walker or cane  HISTORY OF PRESENT ILLNESS: The patient is a 88 y.o. female with the indicated medical history here for recommendations regarding her symptomatic aortic valvular disease.  The patient looks very much younger than her stated age.  She is here with her daughter.  She tells me that over the last year she has been slowing down but more so over the last few months.  She is normally accustomed to doing all of her activities of daily living.  Over the last few months she has noticed increasing shortness of breath with these activities.  She has had an episode of presyncope but no frank syncope.  She endorses dyspnea on exertion walking about 10 feet.  She also has some chest tightness with this.  She denies any significant palpitations, paroxysmal nocturnal dyspnea, orthopnea.  She has not required recent emergency room evaluation or hospitalization.  She lives above her daughter.  She does not have stairs that she needs to navigate.  She uses a cane or a walker to ambulate.  She does not drive.  If she felt better she would like to be more active around her house taking care of it.  She has both upper and lower dentures.  Past Medical History:  Diagnosis Date   Arthritis    Cataract    cataract removal bilaterally approx  20 years ago   Chronic back pain    Coronary artery disease    COVID-19 virus infection 12/2020   Diabetes mellitus without complication (HCC)    Heart murmur    Hyperlipidemia    Hypertension     Past Surgical History:  Procedure Laterality Date   ABDOMINAL HYSTERECTOMY     APPENDECTOMY     BACK SURGERY     three   CARDIAC CATHETERIZATION     6+yrs no stents Dr. Vanita Panda   CATARACT EXTRACTION     RIGHT/LEFT HEART CATH AND CORONARY ANGIOGRAPHY N/A 04/26/2022   Procedure: RIGHT/LEFT HEART CATH AND CORONARY ANGIOGRAPHY;  Surgeon: Iran Ouch, MD;  Location: [facility] INVASIVE CV LAB;  Service: Cardiovascular;  Laterality: N/A;   THORACOLUMBAR SYRINGO SHUNT     TONSILLECTOMY AND ADENOIDECTOMY      Family History  Problem Relation Age of Onset   Heart disease Mother        died from MI   Heart attack Mother    Heart disease Sister    Sudden death Brother        shot and beaten to death during home invasion   Stroke Sister    Sudden death Sister        MVA   Cancer Brother        died from lung cancer   Sudden death Sister        67 days old   Cancer  Brother        died from lung cancer   Stroke Brother        cause of death   Diabetes Father    Stroke Father    Pneumonia Father     Social History   Socioeconomic History   Marital status: Widowed    Spouse name: Not on file   Number of children: 2   Years of education: Not on file   Highest education level: 8th grade  Occupational History   Occupation: retired  Tobacco Use   Smoking status: Never   Smokeless tobacco: Never  Vaping Use   Vaping Use: Never used  Substance and Sexual Activity   Alcohol use: No    Alcohol/week: 0.0 standard drinks of alcohol   Drug use: No   Sexual activity: Not Currently  Other Topics Concern   Not on file  Social History Narrative   Lives with daughter, [name]   Social Determinants of Health   Financial Resource Strain: Low Risk  (07/12/2020)   Overall  Financial Resource Strain (CARDIA)    Difficulty of Paying Living Expenses: Not hard at all  Food Insecurity: No Food Insecurity (07/12/2020)   Hunger Vital Sign    Worried About Running Out of Food in the Last Year: Never true    Ran Out of Food in the Last Year: Never true  Transportation Needs: No Transportation Needs (07/12/2020)   PRAPARE - Administrator, Civil Service (Medical): No    Lack of Transportation (Non-Medical): No  Physical Activity: Inactive (07/12/2020)   Exercise Vital Sign    Days of Exercise per Week: 0 days    Minutes of Exercise per Session: 0 min  Stress: No Stress Concern Present (07/12/2020)   [employer] of Occupational Health - Occupational Stress Questionnaire    Feeling of Stress : Not at all  Social Connections: Moderately Isolated (07/12/2020)   Social Connection and Isolation Panel [NHANES]    Frequency of Communication with Friends and Family: More than three times a week    Frequency of Social Gatherings with Friends and Family: More than three times a week    Attends Religious Services: More than 4 times per year    Active Member of [employer] or Organizations: No    Attends Banker Meetings: Never    Marital Status: Widowed  Intimate Partner Violence: Not At Risk (07/12/2020)   Humiliation, Afraid, Rape, and Kick questionnaire    Fear of Current or Ex-Partner: No    Emotionally Abused: No    Physically Abused: No    Sexually Abused: No     Prior to Admission medications   Medication Sig Start Date End Date Taking? Authorizing Provider  ALPRAZolam Prudy Feeler) 0.5 MG tablet TAKE 1 TABLET BY MOUTH EVERY DAY AT BEDTIME AS NEEDED 04/18/22   Bacigalupo, Marzella Schlein, MD  Artificial Tear Ointment (DRY EYES OP) Apply 1 drop to eye daily.    [provider]  aspirin EC 81 MG tablet Take 81 mg by mouth daily.    [provider]  co-enzyme Q-10 30 MG capsule Take 30 mg by mouth daily.    [provider]  furosemide  (LASIX) 20 MG tablet TAKE 1 TABLET (20 MG TOTAL) BY MOUTH AS DIRECTED. TAKE 1 TABLET (20 MG) 2-3 TIMES A WEEK 02/01/22   Antonieta Iba, MD  LANTUS SOLOSTAR 100 UNIT/ML Solostar Pen INJECT 25 UNITS SUBCUTANEOUSLY 03/07/22   Maple Hudson.,  MD  lisinopril (ZESTRIL) 20 MG tablet Take 1 tablet (20 mg total) by mouth daily. 12/13/21   Caro Laroche, DO  Magnesium 400 MG CAPS Take 400 mg by mouth daily.    [provider]  Multiple Vitamins-Minerals (PRESERVISION AREDS) CAPS Take 1 capsule by mouth 2 (two) times daily.     [provider]  nitroGLYCERIN (NITROSTAT) 0.4 MG SL tablet Place 1 tablet (0.4 mg total) under the tongue every 5 (five) minutes as needed for chest pain. 07/02/21   Antonieta Iba, MD  Omega-3 Fatty Acids (FISH OIL PO) Take by mouth daily.    [provider]  omeprazole (PRILOSEC) 20 MG capsule Take 20 mg by mouth daily. 01/18/22   [provider]  ondansetron (ZOFRAN) 4 MG tablet TAKE 1 TABLET BY MOUTH EVERY 8 HOURS AS NEEDED FOR NAUSEA AND VOMITING 04/22/22   Antonieta Iba, MD  pantoprazole (PROTONIX) 40 MG tablet Take 1 tablet (40 mg total) by mouth 2 (two) times daily before a meal. 11/28/21   Enedina Finner, MD  potassium chloride (KLOR-CON) 10 MEQ tablet TAKE 1 TABLET BY MOUTH 2-3 TIMES A WEEK WITH FUROSEMIDE AS DIRECTED 02/02/22   [provider]  Turmeric 500 MG CAPS Take by mouth daily.    [provider]  vitamin B-12 (CYANOCOBALAMIN) 500 MCG tablet Take 500 mcg by mouth daily.    [provider]    Allergies  Allergen Reactions   Etodolac Nausea Only, Other (See Comments) and Anxiety    Dizziness, too   Naproxen Other (See Comments) and Anxiety    Gave her an ulcer after taking it for years   Statins Other (See Comments)    No energy and myalgias   Latex Rash   Terbinafine Other (See Comments) and Rash    "Maybe made me nervous"   Terbinafine Hcl Rash    REVIEW OF SYSTEMS:  General: no  fevers/chills/night sweats Eyes: no blurry vision, diplopia, or amaurosis ENT: no sore throat or hearing loss Resp: no cough, wheezing, or hemoptysis CV: no edema or palpitations GI: no abdominal pain, nausea, vomiting, diarrhea, or constipation GU: no dysuria, frequency, or hematuria Skin: no rash Neuro: no headache, numbness, tingling, or weakness of extremities Musculoskeletal: no joint pain or swelling Heme: no bleeding, DVT, or easy bruising Endo: no polydipsia or polyuria  BP (!) 120/50 (BP Location: Left Arm, Patient Position: Sitting, Cuff Size: Normal)   Pulse 70   Ht 5' 4.5" (1.638 m)   Wt 152 lb (68.9 kg)   SpO2 98%   BMI 25.69 kg/m   PHYSICAL EXAM: GEN:  AO x 3 in no acute distress HEENT: normal Dentition: Dentures Neck: JVP normal. +2 carotid upstrokes without bruits with radiation of aortic stenosis murmur. No thyromegaly. Lungs: equal expansion, clear bilaterally CV: Apex is discrete and nondisplaced, RRR with 3 out of 6 systolic ejection murmur Abd: soft, non-tender, non-distended; no bruit; positive bowel sounds Ext: no edema, ecchymoses, or cyanosis Vascular: 2+ femoral pulses, 2+ radial pulses       Skin: warm and dry without rash Neuro: CN II-XII grossly intact; motor and sensory grossly intact    DATA AND STUDIES:  EKG: Sinus rhythm with left bundle branch block  2D ECHO: Moderate severe aortic stenosis with an ejection fraction of 60 to 65% with no other valvular abnormalities  CARDIAC CATH: Mild obstructive coronary artery disease and pulmonary venous hypertension  STS RISK CALCULATOR: Pending  NHYA CLASS: 2-3  ASSESSMENT AND PLAN:   Nonrheumatic aortic valve stenosis  The patient has developed lifestyle limiting symptoms attributable to her stage D1 aortic stenosis.  Given her frailty and advanced age I do not think she will be a candidate for surgical aortic valve replacement.  She is underwent coronary angiography which demonstrated  minimal obstructive coronary artery disease with pulmonary venous hypertension.  We had a long conversation about potential treatment options.  She is a pretty extraordinary looking 88 year old and so I think it makes sense to pursue an aortic valve intervention in this particular patient.  We will refer her for CTA and cardiothoracic surgical evaluation.  I did tell the patient and her family to monitor for any worrisome symptoms such as severe presyncope or frank syncope which would trigger them to seek urgent medical attention, transferred to [facility], and expedited consideration for aortic valve intervention.  I have personally reviewed the patients imaging data as summarized above.  I have reviewed the natural history of aortic stenosis with the patient and family members who are present today. We have discussed the limitations of medical therapy and the poor prognosis associated with symptomatic aortic stenosis. We have also reviewed potential treatment options, including palliative medical therapy, conventional surgical aortic valve replacement, and transcatheter aortic valve replacement. We discussed treatment options in the context of this patient's specific comorbid medical conditions.   All of the patient's questions were answered today. Will make further recommendations based on the results of studies outlined above.   Total time spent with patient today 60 minutes. This includes reviewing records, evaluating the patient and coordinating care.   Orbie Pyo, MD  05/10/2022 1:30 PM    [facility] [address] [place], [place], [place]  [address] Phone: [phone]; Fax: [phone]

## 2022-05-10 ENCOUNTER — Encounter: Payer: Self-pay | Admitting: Internal Medicine

## 2022-05-10 ENCOUNTER — Ambulatory Visit: Payer: PPO | Admitting: Internal Medicine

## 2022-05-10 VITALS — BP 120/50 | HR 70 | Ht 64.5 in | Wt 152.0 lb

## 2022-05-10 DIAGNOSIS — I350 Nonrheumatic aortic (valve) stenosis: Secondary | ICD-10-CM | POA: Diagnosis not present

## 2022-05-10 NOTE — Progress Notes (Signed)
Pre Surgical Assessment: 5 M Walk Test  49M=16.66ft  5 Meter Walk Test- trial 1: 14.12 seconds 5 Meter Walk Test- trial 2: 10.74 seconds 5 Meter Walk Test- trial 3: 10.90 seconds 5 Meter Walk Test Average: 11.92 seconds  Pt normally uses cane or walker for ambulation.  Did not have at visit today.  Hand held and wall assist provided.

## 2022-05-16 ENCOUNTER — Ambulatory Visit (HOSPITAL_COMMUNITY)
Admission: RE | Admit: 2022-05-16 | Discharge: 2022-05-16 | Disposition: A | Discharge status: Home / Self Care | Payer: PPO | Source: Ambulatory Visit | Attending: Internal Medicine | Admitting: Internal Medicine

## 2022-05-16 DIAGNOSIS — R0602 Shortness of breath: Secondary | ICD-10-CM | POA: Insufficient documentation

## 2022-05-16 DIAGNOSIS — I350 Nonrheumatic aortic (valve) stenosis: Secondary | ICD-10-CM | POA: Diagnosis not present

## 2022-05-16 DIAGNOSIS — I2510 Atherosclerotic heart disease of native coronary artery without angina pectoris: Secondary | ICD-10-CM | POA: Diagnosis not present

## 2022-05-16 DIAGNOSIS — Z01818 Encounter for other preprocedural examination: Secondary | ICD-10-CM | POA: Diagnosis not present

## 2022-05-16 DIAGNOSIS — K8020 Calculus of gallbladder without cholecystitis without obstruction: Secondary | ICD-10-CM | POA: Diagnosis not present

## 2022-05-16 MED ORDER — METOPROLOL TARTRATE 5 MG/5ML IV SOLN
INTRAVENOUS | Status: AC
  Administered 2022-05-16: 10 mg via INTRAVENOUS
  Filled 2022-05-16: qty 10

## 2022-05-16 MED ORDER — METOPROLOL TARTRATE 5 MG/5ML IV SOLN: 5.0000 mg | INTRAVENOUS | Status: DC | PRN

## 2022-05-16 MED ORDER — IOHEXOL 350 MG/ML SOLN
100.0000 mL | Freq: Once | INTRAVENOUS | Status: AC | PRN
  Administered 2022-05-16: 100 mL via INTRAVENOUS

## 2022-05-17 ENCOUNTER — Other Ambulatory Visit: Payer: Self-pay | Admitting: Family Medicine

## 2022-05-17 DIAGNOSIS — F419 Anxiety disorder, unspecified: Secondary | ICD-10-CM

## 2022-05-21 ENCOUNTER — Emergency Department: Payer: PPO

## 2022-05-21 ENCOUNTER — Inpatient Hospital Stay
Admission: EM | Admit: 2022-05-21 | Discharge: 2022-05-24 | DRG: 811 | Disposition: A | Discharge status: Home / Self Care | Payer: PPO | Attending: Osteopathic Medicine | Admitting: Osteopathic Medicine

## 2022-05-21 DIAGNOSIS — I110 Hypertensive heart disease with heart failure: Secondary | ICD-10-CM | POA: Diagnosis present

## 2022-05-21 DIAGNOSIS — E785 Hyperlipidemia, unspecified: Secondary | ICD-10-CM | POA: Diagnosis present

## 2022-05-21 DIAGNOSIS — Z66 Do not resuscitate: Secondary | ICD-10-CM | POA: Diagnosis present

## 2022-05-21 DIAGNOSIS — R739 Hyperglycemia, unspecified: Secondary | ICD-10-CM | POA: Diagnosis not present

## 2022-05-21 DIAGNOSIS — D649 Anemia, unspecified: Secondary | ICD-10-CM | POA: Diagnosis not present

## 2022-05-21 DIAGNOSIS — D62 Acute posthemorrhagic anemia: Secondary | ICD-10-CM | POA: Diagnosis not present

## 2022-05-21 DIAGNOSIS — R079 Chest pain, unspecified: Secondary | ICD-10-CM | POA: Diagnosis not present

## 2022-05-21 DIAGNOSIS — Z8616 Personal history of COVID-19: Secondary | ICD-10-CM | POA: Diagnosis not present

## 2022-05-21 DIAGNOSIS — Z794 Long term (current) use of insulin: Secondary | ICD-10-CM

## 2022-05-21 DIAGNOSIS — Z833 Family history of diabetes mellitus: Secondary | ICD-10-CM | POA: Diagnosis not present

## 2022-05-21 DIAGNOSIS — K219 Gastro-esophageal reflux disease without esophagitis: Secondary | ICD-10-CM | POA: Diagnosis not present

## 2022-05-21 DIAGNOSIS — I5031 Acute diastolic (congestive) heart failure: Secondary | ICD-10-CM | POA: Diagnosis not present

## 2022-05-21 DIAGNOSIS — I5032 Chronic diastolic (congestive) heart failure: Secondary | ICD-10-CM | POA: Diagnosis present

## 2022-05-21 DIAGNOSIS — I509 Heart failure, unspecified: Secondary | ICD-10-CM | POA: Diagnosis not present

## 2022-05-21 DIAGNOSIS — Z90710 Acquired absence of both cervix and uterus: Secondary | ICD-10-CM | POA: Diagnosis not present

## 2022-05-21 DIAGNOSIS — G8929 Other chronic pain: Secondary | ICD-10-CM | POA: Diagnosis not present

## 2022-05-21 DIAGNOSIS — I350 Nonrheumatic aortic (valve) stenosis: Secondary | ICD-10-CM | POA: Diagnosis not present

## 2022-05-21 DIAGNOSIS — I2510 Atherosclerotic heart disease of native coronary artery without angina pectoris: Secondary | ICD-10-CM | POA: Diagnosis not present

## 2022-05-21 DIAGNOSIS — Z79899 Other long term (current) drug therapy: Secondary | ICD-10-CM

## 2022-05-21 DIAGNOSIS — I340 Nonrheumatic mitral (valve) insufficiency: Secondary | ICD-10-CM | POA: Diagnosis present

## 2022-05-21 DIAGNOSIS — I214 Non-ST elevation (NSTEMI) myocardial infarction: Secondary | ICD-10-CM

## 2022-05-21 DIAGNOSIS — D509 Iron deficiency anemia, unspecified: Secondary | ICD-10-CM | POA: Diagnosis present

## 2022-05-21 DIAGNOSIS — R0789 Other chest pain: Secondary | ICD-10-CM | POA: Diagnosis not present

## 2022-05-21 DIAGNOSIS — I959 Hypotension, unspecified: Secondary | ICD-10-CM | POA: Diagnosis not present

## 2022-05-21 DIAGNOSIS — Z8249 Family history of ischemic heart disease and other diseases of the circulatory system: Secondary | ICD-10-CM | POA: Diagnosis not present

## 2022-05-21 DIAGNOSIS — I272 Pulmonary hypertension, unspecified: Secondary | ICD-10-CM | POA: Diagnosis present

## 2022-05-21 DIAGNOSIS — K921 Melena: Secondary | ICD-10-CM | POA: Diagnosis present

## 2022-05-21 DIAGNOSIS — E871 Hypo-osmolality and hyponatremia: Secondary | ICD-10-CM | POA: Diagnosis present

## 2022-05-21 DIAGNOSIS — I21A1 Myocardial infarction type 2: Secondary | ICD-10-CM | POA: Diagnosis present

## 2022-05-21 DIAGNOSIS — Z888 Allergy status to other drugs, medicaments and biological substances status: Secondary | ICD-10-CM

## 2022-05-21 DIAGNOSIS — R0689 Other abnormalities of breathing: Secondary | ICD-10-CM | POA: Diagnosis not present

## 2022-05-21 DIAGNOSIS — Z7982 Long term (current) use of aspirin: Secondary | ICD-10-CM

## 2022-05-21 DIAGNOSIS — M549 Dorsalgia, unspecified: Secondary | ICD-10-CM | POA: Diagnosis present

## 2022-05-21 DIAGNOSIS — M1990 Unspecified osteoarthritis, unspecified site: Secondary | ICD-10-CM | POA: Diagnosis not present

## 2022-05-21 DIAGNOSIS — I447 Left bundle-branch block, unspecified: Secondary | ICD-10-CM | POA: Diagnosis not present

## 2022-05-21 DIAGNOSIS — I10 Essential (primary) hypertension: Secondary | ICD-10-CM

## 2022-05-21 DIAGNOSIS — I259 Chronic ischemic heart disease, unspecified: Secondary | ICD-10-CM

## 2022-05-21 DIAGNOSIS — Z886 Allergy status to analgesic agent status: Secondary | ICD-10-CM

## 2022-05-21 DIAGNOSIS — E119 Type 2 diabetes mellitus without complications: Secondary | ICD-10-CM | POA: Diagnosis not present

## 2022-05-21 DIAGNOSIS — Z91040 Latex allergy status: Secondary | ICD-10-CM

## 2022-05-21 DIAGNOSIS — R0600 Dyspnea, unspecified: Secondary | ICD-10-CM | POA: Diagnosis not present

## 2022-05-21 LAB — CBC WITH DIFFERENTIAL/PLATELET
Abs Immature Granulocytes: 0.02 10*3/uL (ref 0.00–0.07)
Basophils Absolute: 0.1 10*3/uL (ref 0.0–0.1)
Basophils Relative: 1 %
Eosinophils Absolute: 0 10*3/uL (ref 0.0–0.5)
Eosinophils Relative: 0 %
HCT: 20.7 % — ABNORMAL LOW (ref 36.0–46.0)
Hemoglobin: 6.1 g/dL — ABNORMAL LOW (ref 12.0–15.0)
Immature Granulocytes: 0 %
Lymphocytes Relative: 12 %
Lymphs Abs: 0.9 10*3/uL (ref 0.7–4.0)
MCH: 25.4 pg — ABNORMAL LOW (ref 26.0–34.0)
MCHC: 29.5 g/dL — ABNORMAL LOW (ref 30.0–36.0)
MCV: 86.3 fL (ref 80.0–100.0)
Monocytes Absolute: 0.6 10*3/uL (ref 0.1–1.0)
Monocytes Relative: 7 %
Neutro Abs: 6.3 10*3/uL (ref 1.7–7.7)
Neutrophils Relative %: 80 %
Platelets: UNDETERMINED 10*3/uL (ref 150–400)
RBC: 2.4 MIL/uL — ABNORMAL LOW (ref 3.87–5.11)
RDW: 15.2 % (ref 11.5–15.5)
WBC: 7.8 10*3/uL (ref 4.0–10.5)
nRBC: 0 % (ref 0.0–0.2)

## 2022-05-21 LAB — COMPREHENSIVE METABOLIC PANEL
ALT: 12 U/L (ref 0–44)
AST: 18 U/L (ref 15–41)
Albumin: 3.6 g/dL (ref 3.5–5.0)
Alkaline Phosphatase: 114 U/L (ref 38–126)
Anion gap: 6 (ref 5–15)
BUN: 36 mg/dL — ABNORMAL HIGH (ref 8–23)
CO2: 22 mmol/L (ref 22–32)
Calcium: 8.3 mg/dL — ABNORMAL LOW (ref 8.9–10.3)
Chloride: 102 mmol/L (ref 98–111)
Creatinine, Ser: 1.21 mg/dL — ABNORMAL HIGH (ref 0.44–1.00)
GFR, Estimated: 42 mL/min — ABNORMAL LOW (ref >60)
Glucose, Bld: 293 mg/dL — ABNORMAL HIGH (ref 70–99)
Potassium: 4.5 mmol/L (ref 3.5–5.1)
Sodium: 130 mmol/L — ABNORMAL LOW (ref 135–145)
Total Bilirubin: 0.6 mg/dL (ref 0.3–1.2)
Total Protein: 5.9 g/dL — ABNORMAL LOW (ref 6.5–8.1)

## 2022-05-21 LAB — APTT: aPTT: 31 seconds (ref 24–36)

## 2022-05-21 LAB — LACTATE DEHYDROGENASE: LDH: 208 U/L — ABNORMAL HIGH (ref 98–192)

## 2022-05-21 LAB — TYPE AND SCREEN

## 2022-05-21 LAB — PROTIME-INR
INR: 1.1 (ref 0.8–1.2)
Prothrombin Time: 13.6 seconds (ref 11.4–15.2)

## 2022-05-21 LAB — TROPONIN I (HIGH SENSITIVITY)
Troponin I (High Sensitivity): 116 ng/L (ref <18)
Troponin I (High Sensitivity): 523 ng/L (ref <18)

## 2022-05-21 LAB — RETICULOCYTES
Immature Retic Fract: 34.2 % — ABNORMAL HIGH (ref 2.3–15.9)
RBC.: 2.65 MIL/uL — ABNORMAL LOW (ref 3.87–5.11)
Retic Count, Absolute: 71.8 10*3/uL (ref 19.0–186.0)
Retic Ct Pct: 2.7 % (ref 0.4–3.1)

## 2022-05-21 LAB — CBG MONITORING, ED: Glucose-Capillary: 195 mg/dL — ABNORMAL HIGH (ref 70–99)

## 2022-05-21 LAB — FOLATE: Folate: 28 ng/mL (ref >5.9)

## 2022-05-21 LAB — FERRITIN: Ferritin: 12 ng/mL (ref 11–307)

## 2022-05-21 LAB — IRON AND TIBC
Iron: 11 ug/dL — ABNORMAL LOW (ref 28–170)
Saturation Ratios: 2 % — ABNORMAL LOW (ref 10.4–31.8)
TIBC: 462 ug/dL — ABNORMAL HIGH (ref 250–450)
UIBC: 451 ug/dL

## 2022-05-21 LAB — ABO/RH: ABO/RH(D): O POS

## 2022-05-21 LAB — BRAIN NATRIURETIC PEPTIDE: B Natriuretic Peptide: 300.3 pg/mL — ABNORMAL HIGH (ref 0.0–100.0)

## 2022-05-21 MED ORDER — ACETAMINOPHEN 325 MG PO TABS: 650.0000 mg | ORAL_TABLET | Freq: Four times a day (QID) | ORAL | Status: DC | PRN

## 2022-05-21 MED ORDER — MAGNESIUM HYDROXIDE 400 MG/5ML PO SUSP: 30.0000 mL | Freq: Every day | ORAL | Status: DC | PRN

## 2022-05-21 MED ORDER — ACETAMINOPHEN 650 MG RE SUPP: 650.0000 mg | Freq: Four times a day (QID) | RECTAL | Status: DC | PRN

## 2022-05-21 MED ORDER — SODIUM CHLORIDE 0.9% IV SOLUTION: Freq: Once | INTRAVENOUS | Status: DC

## 2022-05-21 MED ORDER — HEPARIN (PORCINE) 25000 UT/250ML-% IV SOLN
850.0000 [IU]/h | INTRAVENOUS | Status: DC
  Administered 2022-05-21 – 2022-05-23 (×3): 850 [IU]/h via INTRAVENOUS
  Filled 2022-05-21 (×3): qty 250

## 2022-05-21 MED ORDER — SODIUM CHLORIDE 0.9 % IV SOLN: 10.0000 mL/h | Freq: Once | INTRAVENOUS | Status: DC

## 2022-05-21 MED ORDER — INSULIN ASPART 100 UNIT/ML IJ SOLN
0.0000 [IU] | Freq: Three times a day (TID) | INTRAMUSCULAR | Status: DC
  Administered 2022-05-21: 2 [IU] via SUBCUTANEOUS
  Administered 2022-05-22: 1 [IU] via SUBCUTANEOUS
  Administered 2022-05-22 – 2022-05-23 (×4): 2 [IU] via SUBCUTANEOUS
  Administered 2022-05-23: 1 [IU] via SUBCUTANEOUS
  Administered 2022-05-23: 3 [IU] via SUBCUTANEOUS
  Administered 2022-05-24: 2 [IU] via SUBCUTANEOUS
  Administered 2022-05-24: 3 [IU] via SUBCUTANEOUS
  Filled 2022-05-21 (×8): qty 1

## 2022-05-21 MED ORDER — ENOXAPARIN SODIUM 30 MG/0.3ML IJ SOSY: 30.0000 mg | PREFILLED_SYRINGE | INTRAMUSCULAR | Status: DC

## 2022-05-21 MED ORDER — TRAZODONE HCL 50 MG PO TABS
25.0000 mg | ORAL_TABLET | Freq: Every evening | ORAL | Status: DC | PRN
  Administered 2022-05-22 (×2): 25 mg via ORAL
  Filled 2022-05-21 (×3): qty 1

## 2022-05-21 MED ORDER — ONDANSETRON HCL 4 MG/2ML IJ SOLN: 4.0000 mg | Freq: Four times a day (QID) | INTRAMUSCULAR | Status: DC | PRN

## 2022-05-21 MED ORDER — ONDANSETRON HCL 4 MG PO TABS: 4.0000 mg | ORAL_TABLET | Freq: Four times a day (QID) | ORAL | Status: DC | PRN

## 2022-05-21 MED ORDER — ENOXAPARIN SODIUM 40 MG/0.4ML IJ SOSY: 40.0000 mg | PREFILLED_SYRINGE | INTRAMUSCULAR | Status: DC

## 2022-05-21 MED ORDER — HEPARIN BOLUS VIA INFUSION
4000.0000 [IU] | Freq: Once | INTRAVENOUS | Status: AC
  Administered 2022-05-21: 4000 [IU] via INTRAVENOUS
  Filled 2022-05-21: qty 4000

## 2022-05-21 MED ORDER — FUROSEMIDE 10 MG/ML IJ SOLN
20.0000 mg | Freq: Once | INTRAMUSCULAR | Status: AC
  Administered 2022-05-21: 20 mg via INTRAVENOUS
  Filled 2022-05-21: qty 4

## 2022-05-21 MED ORDER — METOPROLOL TARTRATE 25 MG PO TABS
12.5000 mg | ORAL_TABLET | Freq: Once | ORAL | Status: AC
  Administered 2022-05-21: 12.5 mg via ORAL
  Filled 2022-05-21: qty 1

## 2022-05-21 MED ORDER — FUROSEMIDE 10 MG/ML IJ SOLN
20.0000 mg | Freq: Two times a day (BID) | INTRAMUSCULAR | Status: DC
  Administered 2022-05-21 – 2022-05-22 (×2): 20 mg via INTRAVENOUS
  Filled 2022-05-21 (×2): qty 4

## 2022-05-21 NOTE — Assessment & Plan Note (Signed)
-   The patient will be placed on IV heparin per protocol. - We will follow serial troponins. - The patient will be placed on aspirin as well as sublingual nitroglycerin and morphine sulfate for pain.

## 2022-05-21 NOTE — ED Provider Notes (Signed)
[facility] Provider Note    Event Date/Time   First MD Initiated Contact with Patient 05/21/22 Vernelle Emerald     (approximate)   History   Chest Pain (Midline CP and worsening dyspnea x 2 weeks; Patient received 4 baby ASA and chest pain resolved after SL NTG by EMS)   HPI  PIPER HASSEBROCK is a 88 y.o. female  with h/o mod to severe AS here with chest pain, SOB. Pt reports that for the past week she has had worsening SOB, initially with exertion but now even at rest. She has severe AS and is being worked up for TAVR with Dr. Cyndia Bent. Reoprts that earlier today, she "overdid it" getting things ready for the holiday, and began to feel acute chest pressure, SOB. She has no CP currently but continues to feel SOB. No lightheadedness today but did have some yesterday. No syncope. She's been taking her meds as prescribed. Denies significant increase in LE swelling. No fevers, chills.       Physical Exam   Triage Vital Signs: ED Triage Vitals  Enc Vitals Group     BP 05/21/22 1830 (!) 115/57     Pulse Rate 05/21/22 1830 98     Resp 05/21/22 1830 (!) 23     Temp 05/21/22 1830 98.2 F (36.8 C)     Temp Source 05/21/22 1830 Oral     SpO2 05/21/22 1830 99 %     Weight 05/21/22 1830 162 lb 7.7 oz (73.7 kg)     Height 05/21/22 1830 '5\' 4"'$  (1.626 m)     Head Circumference --      Peak Flow --      Pain Score 05/21/22 1839 0     Pain Loc --      Pain Edu? --      Excl. in [place]? --     Most recent vital signs: Vitals:   05/22/22 0345 05/22/22 0700  BP: (!) 138/48 (!) 150/59  Pulse: 71 66  Resp: 17 15  Temp: 98.5 F (36.9 C) 98 F (36.7 C)  SpO2:  99%     General: Awake, no distress.  CV:  Good peripheral perfusion. Marked systolic ejection murmur. Pulses symmetric. Resp:  Bibasilar rales with mild tachypnea. Abd:  No distention. No tenderness. Other:  No significant LE edema. Well appearing. Alert and oriented.   ED Results / Procedures / Treatments    Labs (all labs ordered are listed, but only abnormal results are displayed) Labs Reviewed  CBC WITH DIFFERENTIAL/PLATELET - Abnormal; Notable for the following components:      Result Value   RBC 2.40 (*)    Hemoglobin 6.1 (*)    HCT 20.7 (*)    MCH 25.4 (*)    MCHC 29.5 (*)    All other components within normal limits  COMPREHENSIVE METABOLIC PANEL - Abnormal; Notable for the following components:   Sodium 130 (*)    Glucose, Bld 293 (*)    BUN 36 (*)    Creatinine, Ser 1.21 (*)    Calcium 8.3 (*)    Total Protein 5.9 (*)    GFR, Estimated 42 (*)    All other components within normal limits  BRAIN NATRIURETIC PEPTIDE - Abnormal; Notable for the following components:   B Natriuretic Peptide 300.3 (*)    All other components within normal limits  LACTATE DEHYDROGENASE - Abnormal; Notable for the following components:   LDH 208 (*)    All other  components within normal limits  IRON AND TIBC - Abnormal; Notable for the following components:   Iron 11 (*)    TIBC 462 (*)    Saturation Ratios 2 (*)    All other components within normal limits  RETICULOCYTES - Abnormal; Notable for the following components:   RBC. 2.65 (*)    Immature Retic Fract 34.2 (*)    All other components within normal limits  BASIC METABOLIC PANEL - Abnormal; Notable for the following components:   Sodium 134 (*)    Glucose, Bld 137 (*)    BUN 32 (*)    Creatinine, Ser 1.15 (*)    Calcium 8.0 (*)    GFR, Estimated 44 (*)    All other components within normal limits  CBC - Abnormal; Notable for the following components:   RBC 3.63 (*)    Hemoglobin 9.7 (*)    HCT 30.7 (*)    Platelets 81 (*)    All other components within normal limits  CBG MONITORING, ED - Abnormal; Notable for the following components:   Glucose-Capillary 195 (*)    All other components within normal limits  TROPONIN I (HIGH SENSITIVITY) - Abnormal; Notable for the following components:   Troponin I (High Sensitivity) 116  (*)    All other components within normal limits  TROPONIN I (HIGH SENSITIVITY) - Abnormal; Notable for the following components:   Troponin I (High Sensitivity) 523 (*)    All other components within normal limits  TROPONIN I (HIGH SENSITIVITY) - Abnormal; Notable for the following components:   Troponin I (High Sensitivity) 4,218 (*)    All other components within normal limits  VITAMIN B12  FOLATE  FERRITIN  APTT  PROTIME-INR  HEPARIN LEVEL (UNFRACTIONATED)  HEPARIN LEVEL (UNFRACTIONATED)  TYPE AND SCREEN  PREPARE RBC (CROSSMATCH)  ABO/RH  TYPE AND SCREEN     EKG Normal sinus rhythm, LBBB. VR 98, QRS 180, QTc 509. No Sgarbossa criteria. No acute St elevations.   RADIOLOGY CXR: Bibasilar opacities and likely edema, possible PNA vs atelectasis in R base   I also independently reviewed and agree with radiologist interpretations.   PROCEDURES:  Critical Care performed: Yes, see critical care procedure note(s)  .Critical Care  Performed by: Duffy Bruce, MD Authorized by: Duffy Bruce, MD   Critical care provider statement:    Critical care time (minutes):  30   Critical care time was exclusive of:  Separately billable procedures and treating other patients   Critical care was necessary to treat or prevent imminent or life-threatening deterioration of the following conditions:  Cardiac failure, circulatory failure and respiratory failure   Critical care was time spent personally by me on the following activities:  Development of treatment plan with patient or surrogate, discussions with consultants, evaluation of patient's response to treatment, examination of patient, ordering and review of laboratory studies, ordering and review of radiographic studies, ordering and performing treatments and interventions, pulse oximetry, re-evaluation of patient's condition and review of old charts     MEDICATIONS ORDERED IN ED: Medications  furosemide (LASIX) injection 20  mg (20 mg Intravenous Given 05/22/22 1030)  acetaminophen (TYLENOL) tablet 650 mg (has no administration in time range)    Or  acetaminophen (TYLENOL) suppository 650 mg (has no administration in time range)  traZODone (DESYREL) tablet 25 mg (25 mg Oral Given 05/22/22 0400)  magnesium hydroxide (MILK OF MAGNESIA) suspension 30 mL (has no administration in time range)  ondansetron (ZOFRAN) tablet 4 mg (has no  administration in time range)    Or  ondansetron (ZOFRAN) injection 4 mg (has no administration in time range)  insulin aspart (novoLOG) injection 0-9 Units (1 Units Subcutaneous Given 05/22/22 0738)  heparin ADULT infusion 100 units/mL (25000 units/248m) (850 Units/hr Intravenous Rate/Dose Verify 05/22/22 0636)  furosemide (LASIX) injection 20 mg (20 mg Intravenous Given 05/21/22 2030)  metoprolol tartrate (LOPRESSOR) tablet 12.5 mg (12.5 mg Oral Given 05/21/22 2040)  heparin bolus via infusion 4,000 Units (4,000 Units Intravenous Bolus from Bag 05/21/22 2246)     IMPRESSION / MDM / [place]/ ED COURSE  I reviewed the triage vital signs and the nursing notes.                               The patient is on the cardiac monitor to evaluate for evidence of arrhythmia and/or significant heart rate changes.   Ddx:  Differential includes the following, with pertinent life- or limb-threatening emergencies considered:  Increasingly severe AS, ACS/angina, CHF, symptomatic anemia, AFib/arrhythmia  Patient's presentation is most consistent with acute presentation with potential threat to life or bodily function.  MDM:  88yo F with h/o severe AS, CAD, here with chest pain, near syncope. Suspect worsening symptomatic AS, with possible ischemia/angina related to this. Pt is pain free now. CXR shows new CHF and trop, BNP both elevated c/w CHF and possible ischemia. No ST elevations and she has no chest pain currently. Pt also increasingly anemic - unclear etiology, will check panel. Query  MAHA in setting of her AS. LDH sent.   Discussed case with pt's Cardiology Dr. GRockey Situ Will admit for medical management/optimization, possible heparin. Discussed that I suspect sx are related to her worsening and now increasingly sx AS, Dr. GRockey Situwill see in AM and discuss with team.    MTindallED: Medications  furosemide (LASIX) injection 20 mg (20 mg Intravenous Given 05/22/22 1030)  acetaminophen (TYLENOL) tablet 650 mg (has no administration in time range)    Or  acetaminophen (TYLENOL) suppository 650 mg (has no administration in time range)  traZODone (DESYREL) tablet 25 mg (25 mg Oral Given 05/22/22 0400)  magnesium hydroxide (MILK OF MAGNESIA) suspension 30 mL (has no administration in time range)  ondansetron (ZOFRAN) tablet 4 mg (has no administration in time range)    Or  ondansetron (ZOFRAN) injection 4 mg (has no administration in time range)  insulin aspart (novoLOG) injection 0-9 Units (1 Units Subcutaneous Given 05/22/22 0738)  heparin ADULT infusion 100 units/mL (25000 units/2543m (850 Units/hr Intravenous Rate/Dose Verify 05/22/22 0636)  furosemide (LASIX) injection 20 mg (20 mg Intravenous Given 05/21/22 2030)  metoprolol tartrate (LOPRESSOR) tablet 12.5 mg (12.5 mg Oral Given 05/21/22 2040)  heparin bolus via infusion 4,000 Units (4,000 Units Intravenous Bolus from Bag 05/21/22 2246)     Consults:  Dr. GoRockey SituCardiology Dr. MaSidney Aceospitalist   EMR reviewed  Reviewed outside Cardiology notes and notes from Dr. BaCyndia Bentwith plan for likely TAVR soon     FINAL CLINICAL IMPRESSION(S) / ED DIAGNOSES   Final diagnoses:  Severe aortic stenosis  Acute congestive heart failure, unspecified heart failure type ([facility]iac ischemia     Rx / DC Orders   ED Discharge Orders     None        Note:  This document was prepared using Dragon voice recognition software and may include unintentional dictation errors.   IsDuffy BruceMD  05/22/22 1052  

## 2022-05-21 NOTE — ED Triage Notes (Signed)
Midline CP and worsening dyspnea x 2 weeks; Patient received 4 baby ASA and chest pain resolved after SL NTG by EMS

## 2022-05-21 NOTE — Assessment & Plan Note (Signed)
-   We will continue PPI therapy 

## 2022-05-21 NOTE — Assessment & Plan Note (Signed)
-   We will continue her antihypertensives. 

## 2022-05-21 NOTE — H&P (Incomplete)
[place]   PATIENT NAME: Jasmine Webster    MR#:  [id number]  DATE OF BIRTH:  05-06-1926  DATE OF ADMISSION:  05/21/2022  PRIMARY CARE PHYSICIAN: Jerrol Banana., MD   Patient is coming from: Home  REQUESTING/REFERRING PHYSICIAN: Duffy Bruce, MD  CHIEF COMPLAINT:   Chief Complaint  Patient presents with   Chest Pain    Midline CP and worsening dyspnea x 2 weeks; Patient received 4 baby ASA and chest pain resolved after SL NTG by EMS    HISTORY OF PRESENT ILLNESS:  Jasmine Webster is a 88 y.o. Caucasian female with medical history significant for coronary artery disease, type diabetes mellitus, hypertension dyslipidemia, who presented to the ER with acute onset of worsening dyspnea over the last couple of weeks as well as midsternal chest pain that started today.  She was given 40 aspirin and 1 sublingual nitroglycerin by EMS with resolution of the chest pain.  No nausea or vomiting or diaphoresis.  No cough or wheezing.  No dysuria, oliguria or hematuria or flank pain.  The patient has moderate aortic stenosis and was expected to have assessment for TAVR.  ED Course: Upon presentation to the emergency room, BP was 115/57 with respiratory rate of 23 with otherwise normal vital signs.  Labs revealed hyponatremia 130 BUN of 36 with creatinine 1.21 with a glucose of 293 with total protein of 5.9.  BNP was 300.3.  High-sensitivity troponin was 116 and later 523.  CBC showed hemoglobin of 6.1 hematocrit 20.7 compared to 8.7 and 28.6. EKG as reviewed by me : EKG showed sinus rhythm with rate 90 and left bundle branch block Imaging: Portable chest x-ray showed bibasilar interstitial opacities and small patchy airspace opacity at the right costophrenic angle that may reflect subsegmental atelectasis, aspiration related changes or possibly infection in the appropriate clinical context.  The patient was given 20 mg of IV Lasix and troponin given a p.o. Lopressor.  She will be  admitted to a progressive unit bed for further evaluation and management. PAST MEDICAL HISTORY:   Past Medical History:  Diagnosis Date   Arthritis    Cataract    cataract removal bilaterally approx 20 years ago   Chronic back pain    Coronary artery disease    COVID-19 virus infection 12/2020   Diabetes mellitus without complication (HCC)    Heart murmur    Hyperlipidemia    Hypertension     PAST SURGICAL HISTORY:   Past Surgical History:  Procedure Laterality Date   ABDOMINAL HYSTERECTOMY     APPENDECTOMY     BACK SURGERY     three   CARDIAC CATHETERIZATION     6+yrs no stents Dr. Verneda Skill   CATARACT EXTRACTION     RIGHT/LEFT HEART CATH AND CORONARY ANGIOGRAPHY N/A 04/26/2022   Procedure: RIGHT/LEFT HEART CATH AND CORONARY ANGIOGRAPHY;  Surgeon: Wellington Hampshire, MD;  Location: [facility];  Service: Cardiovascular;  Laterality: N/A;   THORACOLUMBAR SYRINGO SHUNT     TONSILLECTOMY AND ADENOIDECTOMY      SOCIAL HISTORY:   Social History   Tobacco Use   Smoking status: Never   Smokeless tobacco: Never  Substance Use Topics   Alcohol use: No    Alcohol/week: 0.0 standard drinks of alcohol    FAMILY HISTORY:   Family History  Problem Relation Age of Onset   Heart disease Mother        died from MI  Heart attack Mother    Heart disease Sister    Sudden death Brother        shot and beaten to death during home invasion   Stroke Sister    Sudden death Sister        MVA   Cancer Brother        died from lung cancer   Sudden death Sister        64 days old   Cancer Brother        died from lung cancer   Stroke Brother        cause of death   Diabetes Father    Stroke Father    Pneumonia Father     DRUG ALLERGIES:   Allergies  Allergen Reactions   Etodolac Nausea Only, Other (See Comments) and Anxiety    Dizziness, too   Naproxen Other (See Comments) and Anxiety    Gave her an ulcer after taking it for years   Statins Other (See  Comments)    No energy and myalgias   Latex Rash   Terbinafine Other (See Comments) and Rash    "Maybe made me nervous"   Terbinafine Hcl Rash    REVIEW OF SYSTEMS:   ROS As per history of present illness. All pertinent systems were reviewed above. Constitutional, HEENT, cardiovascular, respiratory, GI, GU, musculoskeletal, neuro, psychiatric, endocrine, integumentary and hematologic systems were reviewed and are otherwise negative/unremarkable except for positive findings mentioned above in the HPI.   MEDICATIONS AT HOME:   Prior to Admission medications   Medication Sig Start Date End Date Taking? Authorizing Provider  ALPRAZolam Duanne Moron) 0.5 MG tablet TAKE 1 TABLET BY MOUTH EVERY DAY AT BEDTIME AS NEEDED 05/18/22  Yes Drubel, Ria Comment, PA-C  Artificial Tear Ointment (DRY EYES OP) Apply 1 drop to eye daily.   Yes [provider]  aspirin EC 81 MG tablet Take 81 mg by mouth daily.   Yes [provider]  co-enzyme Q-10 30 MG capsule Take 30 mg by mouth daily.   Yes [provider]  furosemide (LASIX) 20 MG tablet TAKE 1 TABLET (20 MG TOTAL) BY MOUTH AS DIRECTED. TAKE 1 TABLET (20 MG) 2-3 TIMES A WEEK 02/01/22  Yes Gollan, Kathlene November, MD  LANTUS SOLOSTAR 100 UNIT/ML Solostar Pen INJECT 25 UNITS SUBCUTANEOUSLY Patient taking differently: 25 Units daily. INJECT 25 UNITS SUBCUTANEOUSLY 03/07/22  Yes Jerrol Banana., MD  lisinopril (ZESTRIL) 20 MG tablet Take 1 tablet (20 mg total) by mouth daily. 12/13/21  Yes Myles Gip, DO  Magnesium 400 MG CAPS Take 400 mg by mouth daily.   Yes [provider]  Multiple Vitamins-Minerals (PRESERVISION AREDS) CAPS Take 1 capsule by mouth 2 (two) times daily.    Yes [provider]  Omega-3 Fatty Acids (FISH OIL PO) Take by mouth daily.   Yes [provider]  omeprazole (PRILOSEC) 20 MG capsule Take 20 mg by mouth daily. 01/18/22  Yes [provider]  pantoprazole (PROTONIX) 40 MG tablet  Take 1 tablet (40 mg total) by mouth 2 (two) times daily before a meal. 11/28/21  Yes Fritzi Mandes, MD  potassium chloride (KLOR-CON) 10 MEQ tablet TAKE 1 TABLET BY MOUTH 2-3 TIMES A WEEK WITH FUROSEMIDE AS DIRECTED 02/02/22  Yes [provider]  Turmeric 500 MG CAPS Take by mouth daily.   Yes [provider]  vitamin B-12 (CYANOCOBALAMIN) 500 MCG tablet Take 500 mcg by mouth once a week.   Yes  [provider]  nitroGLYCERIN (NITROSTAT) 0.4 MG SL tablet Place 1 tablet (0.4 mg total) under the tongue every 5 (five) minutes as needed for chest pain. 07/02/21   Minna Merritts, MD  ondansetron (ZOFRAN) 4 MG tablet TAKE 1 TABLET BY MOUTH EVERY 8 HOURS AS NEEDED FOR NAUSEA AND VOMITING 04/22/22   Minna Merritts, MD      VITAL SIGNS:  Blood pressure (!) 162/70, pulse 95, temperature 98.2 F (36.8 C), temperature source Oral, resp. rate 20, height '5\' 4"'$  (1.626 m), weight 73.7 kg, SpO2 100 %.  PHYSICAL EXAMINATION:  Physical Exam  GENERAL:  88 y.o.-year-old Caucasian female patient lying in the bed with no acute distress.  EYES: Pupils equal, round, reactive to light and accommodation. No scleral icterus. Extraocular muscles intact.  HEENT: Head atraumatic, normocephalic. Oropharynx and nasopharynx clear.  NECK:  Supple, no jugular venous distention. No thyroid enlargement, no tenderness.  LUNGS: Diminished bibasilar breath sounds with bibasilar rales.  No use of accessory muscles of respiration.  CARDIOVASCULAR: Regular rate and rhythm, S1, S2 normal. No murmurs, rubs, or gallops.  ABDOMEN: Soft, nondistended, nontender. Bowel sounds present. No organomegaly or mass.  EXTREMITIES: No pedal edema, cyanosis, or clubbing.  NEUROLOGIC: Cranial nerves II through XII are intact. Muscle strength 5/5 in all extremities. Sensation intact. Gait not checked.  PSYCHIATRIC: The patient is alert and oriented x 3.  Normal affect and good eye contact. SKIN: No obvious rash, lesion, or  ulcer.   LABORATORY PANEL:   CBC Recent Labs  Lab 05/21/22 1833  WBC 7.8  HGB 6.1*  HCT 20.7*  PLT PLATELET CLUMPS NOTED ON SMEAR, UNABLE TO ESTIMATE   ------------------------------------------------------------------------------------------------------------------  Chemistries  Recent Labs  Lab 05/21/22 1833  NA 130*  K 4.5  CL 102  CO2 22  GLUCOSE 293*  BUN 36*  CREATININE 1.21*  CALCIUM 8.3*  AST 18  ALT 12  ALKPHOS 114  BILITOT 0.6   ------------------------------------------------------------------------------------------------------------------  Cardiac Enzymes No results for input(s): "TROPONINI" in the last 168 hours. ------------------------------------------------------------------------------------------------------------------  RADIOLOGY:  DG Chest Portable 1 View  Result Date: 05/21/2022 CLINICAL DATA:  Chest pain.  Worsening dyspnea for 2 weeks. EXAM: PORTABLE CHEST 1 VIEW COMPARISON:  Chest radiograph 03/13/2022 and earlier; CT chest 05/16/2022. FINDINGS: The heart size and mediastinal contours are within normal limits. Aortic calcifications. Bibasilar interstitial opacities. Small patchy airspace opacity in the right costophrenic angle. No pleural effusion or pneumothorax. Anterior cervical spinal fusion. IMPRESSION: Bibasilar interstitial opacities and small patchy airspace opacity at the right costophrenic angle may reflect subsegmental atelectasis, aspiration related changes or possibly infection in the appropriate clinical context. Aortic Atherosclerosis (ICD10-I70.0). Electronically Signed   By: Ileana Roup M.D.   On: 05/21/2022 18:57      IMPRESSION AND PLAN:  Assessment and Plan: * Acute CHF (congestive heart failure) ([place]) - The patient will be admitted to the PCU. - We will gently diurese with IV Lasix. - We will obtain a 2D echo and cardiology consult. -Dr. Rockey Situ was notified and is aware about the patient.  He recommended admission  here.  NSTEMI (non-ST elevated myocardial infarction) ([place]) - The patient will be placed on IV heparin per protocol. - We will follow serial troponins. - The patient will be placed on aspirin as well as sublingual nitroglycerin and morphine sulfate for pain.    Benign essential HTN - We will continue her antihypertensives.  Type 2 diabetes mellitus without complications (HCC) - We will place her on supplement  coverage with NovoLog. - We will continue her basal coverage. - We will continue Januvia and Amaryl.  GERD without esophagitis - We will continue PPI therapy.  Dyslipidemia - We will continue statin therapy.   DVT prophylaxis: Lovenox.  Advanced Care Planning:  Code Status: full code.  Family Communication:  The plan of care was discussed in details with the patient (and family). I answered all questions. The patient agreed to proceed with the above mentioned plan. Further management will depend upon hospital course. Disposition Plan: Back to previous home environment Consults called: Cardiology. All the records are reviewed and case discussed with ED provider.  Status is: Inpatient  At the time of the admission, it appears that the appropriate admission status for this patient is inpatient.  This is judged to be reasonable and necessary in order to provide the required intensity of service to ensure the patient's safety given the presenting symptoms, physical exam findings and initial radiographic and laboratory data in the context of comorbid conditions.  The patient requires inpatient status due to high intensity of service, high risk of further deterioration and high frequency of surveillance required.  I certify that at the time of admission, it is my clinical judgment that the patient will require inpatient hospital care extending more than 2 midnights.                            Dispo: The patient is from: Home              Anticipated d/c is to: Home               Patient currently is not medically stable to d/c.              Difficult to place patient: No  Christel Mormon M.D on 05/21/2022 at 9:43 PM  [facility]   From 7 PM-7 AM, contact night-coverage [email or web address]  CC: Primary care physician; Jerrol Banana., MD

## 2022-05-21 NOTE — Progress Notes (Signed)
PHARMACIST - PHYSICIAN COMMUNICATION  CONCERNING:  Enoxaparin (Lovenox) for DVT Prophylaxis    RECOMMENDATION: Patient was prescribed enoxaprin '40mg'$  q24 hours for VTE prophylaxis.   Filed Weights   05/21/22 1830  Weight: 73.7 kg (162 lb 7.7 oz)    Body mass index is 27.89 kg/m.  Estimated Creatinine Clearance: 28.6 mL/min (A) (by C-G formula based on SCr of 1.21 mg/dL (H)).   Based on [place] patient is candidate for enoxaparin '30mg'$  every 24 hours based on CrCl <53m/min or Weight <45kg  DESCRIPTION: [facility]  Patient is now receiving enoxaparin 30 mg every 24 hours    Yovany Clock Rodriguez-Guzman PharmD, BCPS 05/21/2022 9:25 PM

## 2022-05-21 NOTE — Assessment & Plan Note (Addendum)
-   We will place her on supplement coverage with NovoLog. - We will continue her basal coverage. - We will continue Januvia and Amaryl.

## 2022-05-21 NOTE — Assessment & Plan Note (Signed)
-   The patient will be admitted to the PCU. - We will gently diurese with IV Lasix. - We will obtain a 2D echo and cardiology consult. -Dr. Rockey Situ was notified and is aware about the patient.  He recommended admission here.

## 2022-05-21 NOTE — Progress Notes (Signed)
ANTICOAGULATION CONSULT NOTE - Initial Consult  Pharmacy Consult for Heparin  Indication: chest pain/ACS  Allergies  Allergen Reactions   Etodolac Nausea Only, Other (See Comments) and Anxiety    Dizziness, too   Naproxen Other (See Comments) and Anxiety    Gave her an ulcer after taking it for years   Statins Other (See Comments)    No energy and myalgias   Latex Rash   Terbinafine Other (See Comments) and Rash    "Maybe made me nervous"   Terbinafine Hcl Rash    Patient Measurements: Height: '5\' 4"'$  (162.6 cm) Weight: 73.7 kg (162 lb 7.7 oz) IBW/kg (Calculated) : 54.7 Heparin Dosing Weight: 70 kg   Vital Signs: Temp: 98.2 F (36.8 C) (07/04 1830) Temp Source: Oral (07/04 1830) BP: 162/70 (07/04 2100) Pulse Rate: 95 (07/04 2100)  Labs: Recent Labs    05/21/22 1833 05/21/22 2033  HGB 6.1*  --   HCT 20.7*  --   PLT PLATELET CLUMPS NOTED ON SMEAR, UNABLE TO ESTIMATE  --   CREATININE 1.21*  --   TROPONINIHS 116* 523*    Estimated Creatinine Clearance: 28.6 mL/min (A) (by C-G formula based on SCr of 1.21 mg/dL (H)).   Medical History: Past Medical History:  Diagnosis Date   Arthritis    Cataract    cataract removal bilaterally approx 20 years ago   Chronic back pain    Coronary artery disease    COVID-19 virus infection 12/2020   Diabetes mellitus without complication (HCC)    Heart murmur    Hyperlipidemia    Hypertension     Medications:  (Not in a hospital admission)   Assessment: Pharmacy consulted to dose heparin in this 88 year old female admitted with ACS/NSTEMI.  No prior anticoag noted.  CrCl = 28.6 ml/min   Goal of Therapy:  Heparin level 0.3-0.7 units/ml Monitor platelets by anticoagulation protocol: Yes   Plan:  Give 4000 units bolus x 1 Start heparin infusion at 850 units/hr Check anti-Xa level in 8 hours and daily while on heparin Continue to monitor H&H and platelets  Tagen Brethauer D 05/21/2022,10:07 PM

## 2022-05-21 NOTE — Assessment & Plan Note (Signed)
-   We will continue statin therapy. 

## 2022-05-22 ENCOUNTER — Other Ambulatory Visit: Payer: Self-pay

## 2022-05-22 ENCOUNTER — Inpatient Hospital Stay (HOSPITAL_COMMUNITY)
Admit: 2022-05-22 | Discharge: 2022-05-22 | Disposition: A | Discharge status: Home / Self Care | Payer: PPO | Attending: Family Medicine | Admitting: Family Medicine

## 2022-05-22 DIAGNOSIS — I350 Nonrheumatic aortic (valve) stenosis: Secondary | ICD-10-CM | POA: Diagnosis not present

## 2022-05-22 DIAGNOSIS — E785 Hyperlipidemia, unspecified: Secondary | ICD-10-CM | POA: Diagnosis not present

## 2022-05-22 DIAGNOSIS — I10 Essential (primary) hypertension: Secondary | ICD-10-CM | POA: Diagnosis not present

## 2022-05-22 DIAGNOSIS — D649 Anemia, unspecified: Secondary | ICD-10-CM | POA: Diagnosis not present

## 2022-05-22 DIAGNOSIS — I214 Non-ST elevation (NSTEMI) myocardial infarction: Secondary | ICD-10-CM | POA: Diagnosis not present

## 2022-05-22 DIAGNOSIS — I5031 Acute diastolic (congestive) heart failure: Secondary | ICD-10-CM

## 2022-05-22 LAB — VITAMIN B12: Vitamin B-12: 391 pg/mL (ref 180–914)

## 2022-05-22 LAB — BASIC METABOLIC PANEL
Anion gap: 8 (ref 5–15)
BUN: 32 mg/dL — ABNORMAL HIGH (ref 8–23)
CO2: 23 mmol/L (ref 22–32)
Calcium: 8 mg/dL — ABNORMAL LOW (ref 8.9–10.3)
Chloride: 103 mmol/L (ref 98–111)
Creatinine, Ser: 1.15 mg/dL — ABNORMAL HIGH (ref 0.44–1.00)
GFR, Estimated: 44 mL/min — ABNORMAL LOW (ref >60)
Glucose, Bld: 137 mg/dL — ABNORMAL HIGH (ref 70–99)
Potassium: 3.8 mmol/L (ref 3.5–5.1)
Sodium: 134 mmol/L — ABNORMAL LOW (ref 135–145)

## 2022-05-22 LAB — ECHOCARDIOGRAM COMPLETE
AR max vel: 0.62 cm2
AV Area VTI: 0.62 cm2
AV Area mean vel: 0.61 cm2
AV Mean grad: 33.3 mmHg
AV Peak grad: 57.5 mmHg
Ao pk vel: 3.79 m/s
Area-P 1/2: 5.16 cm2
Height: 64 in
MV VTI: 1.47 cm2
S' Lateral: 2.8 cm
Weight: 2599.66 oz

## 2022-05-22 LAB — HEMOGLOBIN AND HEMATOCRIT, BLOOD
HCT: 32.2 % — ABNORMAL LOW (ref 36.0–46.0)
Hemoglobin: 10.2 g/dL — ABNORMAL LOW (ref 12.0–15.0)

## 2022-05-22 LAB — CBC
HCT: 30.7 % — ABNORMAL LOW (ref 36.0–46.0)
Hemoglobin: 9.7 g/dL — ABNORMAL LOW (ref 12.0–15.0)
MCH: 26.7 pg (ref 26.0–34.0)
MCHC: 31.6 g/dL (ref 30.0–36.0)
MCV: 84.6 fL (ref 80.0–100.0)
Platelets: 81 10*3/uL — ABNORMAL LOW (ref 150–400)
RBC: 3.63 MIL/uL — ABNORMAL LOW (ref 3.87–5.11)
RDW: 14.8 % (ref 11.5–15.5)
WBC: 8.4 10*3/uL (ref 4.0–10.5)
nRBC: 0 % (ref 0.0–0.2)

## 2022-05-22 LAB — TROPONIN I (HIGH SENSITIVITY)
Troponin I (High Sensitivity): 4218 ng/L (ref <18)
Troponin I (High Sensitivity): 3556 ng/L (ref <18)
Troponin I (High Sensitivity): 2929 ng/L (ref <18)

## 2022-05-22 LAB — GLUCOSE, CAPILLARY: Glucose-Capillary: 198 mg/dL — ABNORMAL HIGH (ref 70–99)

## 2022-05-22 LAB — HEPARIN LEVEL (UNFRACTIONATED)
Heparin Unfractionated: 0.66 IU/mL (ref 0.30–0.70)
Heparin Unfractionated: 0.55 IU/mL (ref 0.30–0.70)

## 2022-05-22 LAB — CBG MONITORING, ED: Glucose-Capillary: 169 mg/dL — ABNORMAL HIGH (ref 70–99)

## 2022-05-22 MED ORDER — FUROSEMIDE 10 MG/ML IJ SOLN
20.0000 mg | Freq: Every day | INTRAMUSCULAR | Status: DC
  Administered 2022-05-23 – 2022-05-24 (×2): 20 mg via INTRAVENOUS
  Filled 2022-05-22 (×2): qty 2

## 2022-05-22 MED ORDER — LISINOPRIL 20 MG PO TABS
20.0000 mg | ORAL_TABLET | Freq: Every day | ORAL | Status: DC
  Administered 2022-05-22 – 2022-05-24 (×3): 20 mg via ORAL
  Filled 2022-05-22 (×3): qty 1

## 2022-05-22 MED ORDER — ALPRAZOLAM 0.5 MG PO TABS
0.5000 mg | ORAL_TABLET | Freq: Every evening | ORAL | Status: DC | PRN
  Administered 2022-05-23: 0.5 mg via ORAL
  Filled 2022-05-22: qty 1

## 2022-05-22 NOTE — Progress Notes (Addendum)
ANTICOAGULATION CONSULT NOTE - Initial Consult  Pharmacy Consult for Heparin  Indication: chest pain/ACS  Allergies  Allergen Reactions   Etodolac Nausea Only, Other (See Comments) and Anxiety    Dizziness, too   Naproxen Other (See Comments) and Anxiety    Gave her an ulcer after taking it for years   Statins Other (See Comments)    No energy and myalgias   Latex Rash   Terbinafine Other (See Comments) and Rash    "Maybe made me nervous"   Terbinafine Hcl Rash    Patient Measurements: Height: '5\' 4"'$  (162.6 cm) Weight: 73.7 kg (162 lb 7.7 oz) IBW/kg (Calculated) : 54.7 Heparin Dosing Weight: 70 kg   Vital Signs: Temp: 98.4 F (36.9 C) (07/05 1140) Temp Source: Oral (07/05 1140) BP: 146/67 (07/05 1140) Pulse Rate: 80 (07/05 1140)  Labs: Recent Labs    05/21/22 1833 05/21/22 1834 05/21/22 2033 05/22/22 0637 05/22/22 1435  HGB 6.1*  --   --  9.7* 10.2*  HCT 20.7*  --   --  30.7* 32.2*  PLT PLATELET CLUMPS NOTED ON SMEAR, UNABLE TO ESTIMATE  --   --  81*  --   APTT  --  31  --   --   --   LABPROT  --  13.6  --   --   --   INR  --  1.1  --   --   --   HEPARINUNFRC  --   --   --  0.66 0.55  CREATININE 1.21*  --   --  1.15*  --   TROPONINIHS 116*  --  523* 4,218*  --      Estimated Creatinine Clearance: 30.1 mL/min (A) (by C-G formula based on SCr of 1.15 mg/dL (H)).   Medical History: Past Medical History:  Diagnosis Date   Arthritis    Cataract    cataract removal bilaterally approx 20 years ago   Chronic back pain    Coronary artery disease    COVID-19 virus infection 12/2020   Diabetes mellitus without complication (HCC)    Heart murmur    Hyperlipidemia    Hypertension     Medications:  (Not in a hospital admission)  Assessment: Pharmacy consulted to dose heparin in this 88 year old female admitted with ACS/NSTEMI.  No prior anticoag noted.   7/5 0637 HL 0.66  7/5 1435 HL 0.55   Goal of Therapy:  Heparin level 0.3-0.7 units/ml Monitor  platelets by anticoagulation protocol: Yes   Plan:  Heparin remains therapeutic. Will continue heparin infusion at 850 units/hr. Recheck heparin level with AM labs CBC daily while on heparin. Continue heparin x 48 hours per cardiology     Dorothe Pea, PharmD, BCPS 05/22/2022,3:18 PM

## 2022-05-22 NOTE — Progress Notes (Signed)
PROGRESS NOTE    Jasmine Webster  VQQ:[id number] DOB: 08/29/1926  DOA: 05/21/2022 Date of Service: 05/22/22 PCP: Jerrol Banana., MD     Brief Narrative / Hospital Course:  Jasmine Webster is a 88 y.o. Caucasian female with medical history significant for CAD, T2DM, HTN, HLD, who presented to the ER 05/21/2022 with acute onset of worsening dyspnea over the last couple of weeks as well as midsternal chest pain x1 day,  07/04: Admitted for NSTEMI and CHF exacerbation, troponin was 116 --> 523 --(heparin gtt)--> trend. Hgb 6.1 --(transfusion> 2 units PRBC)--> 9.7, 10.2. Tx: IV lasix, po Lopressor 07/05: Echo done, results pending. Troponin up to max 4218 --> trending to 3556. Cardiology consult: plan 48 hours heparin, d/c if significant drop HH. LHC last month nonobstructive.   Consultants:  Cardiology  Procedures: none    Subjective: Patient reports feeling fatigued but no CP/SOB at time of exam. No nausea, no vomiting.      ASSESSMENT & PLAN:   Principal Problem:   Acute CHF (congestive heart failure) (HCC) Active Problems:   NSTEMI (non-ST elevated myocardial infarction) (HCC)   Symptomatic anemia   Moderate aortic valve stenosis   Benign essential HTN   GERD without esophagitis   Type 2 diabetes mellitus without complications (HCC)   Dyslipidemia   Acute CHF (congestive heart failure) ([place]) - admitted to the PCU. - IV Lasix. - 2D echo  -- cardiology consult, no intervention planned at this time  NSTEMI (non-ST elevated myocardial infarction) ([place]) - IV heparin per protocol, 48 hours duration - We will follow serial troponins. - The patient will be placed on aspirin as well as sublingual nitroglycerin and morphine sulfate for pain.   -- cardiology following   Benign essential HTN - continue antihypertensives.  GERD without esophagitis - continue PPI therapy.  Type 2 diabetes mellitus without complications (HCC) - NovoLog. - continue her basal  coverage. - continue Januvia and Amaryl.  Dyslipidemia - continue statin therapy.  Symptomatic anemia - This is acute on chronic anemia. - stable s/p 2 units PRBC  Moderate aortic valve stenosis - This is likely the main contributor for her acute CHF and possibly for ischemia and non-STEMI. - Cardiology consult will be obtained and 2D echo to assess progression of her aortic valve stenosis. - She was expected to have evaluation for TAVR in August.    DVT prophylaxis: heparin gtt Code Status: DNR Family Communication: husband at bedside on rounds.  Disposition Plan / TOC needs: anticipate back to previous home environment  Barriers to discharge / significant pending items: cardiology consult and recs, IV medications treating active disease exacerbation w/ NSTEMI, CHF. Remains inpatient appropriate              Objective: Vitals:   05/22/22 0200 05/22/22 0300 05/22/22 0345 05/22/22 0700  BP: (!) 140/49 (!) 138/48 (!) 138/48 (!) 150/59  Pulse: 74 75 71 66  Resp: 16 (!) '23 17 15  '$ Temp:  98.3 F (36.8 C) 98.5 F (36.9 C) 98 F (36.7 C)  TempSrc:  Oral Oral Oral  SpO2: 100% 99%  99%  Weight:      Height:        Intake/Output Summary (Last 24 hours) at 05/22/2022 0808 Last data filed at 05/22/2022 0630 Gross per 24 hour  Intake 1590 ml  Output 900 ml  Net 690 ml   Filed Weights   05/21/22 1830  Weight: 73.7 kg    Examination:  Constitutional:  VS as above General Appearance: alert, well-developed, well-nourished, NAD Ears, Nose, Mouth, Throat: Normal appearance Neck: No masses, trachea midline Respiratory: Normal respiratory effort Breath sounds normal, no wheeze/rhonchi/rales Cardiovascular: S1/S2 normal, no murmur/rub/gallop auscultated Trace lower extremity edema Gastrointestinal: Nontender, no masses No hepatomegaly, no splenomegaly No hernia appreciated Musculoskeletal:  No clubbing/cyanosis of digits Neurological: No cranial nerve deficit  on limited exam Psychiatric: Normal judgment/insight Normal mood and affect       Scheduled Medications:   furosemide  20 mg Intravenous Q12H   insulin aspart  0-9 Units Subcutaneous TID AC & HS    Continuous Infusions:  heparin 850 Units/hr (05/22/22 0636)    PRN Medications:  acetaminophen **OR** acetaminophen, magnesium hydroxide, ondansetron **OR** ondansetron (ZOFRAN) IV, traZODone  Antimicrobials:  Anti-infectives (From admission, onward)    None       Data Reviewed: I have personally reviewed following labs and imaging studies  CBC: Recent Labs  Lab 05/21/22 1833 05/22/22 0637  WBC 7.8 8.4  NEUTROABS 6.3  --   HGB 6.1* 9.7*  HCT 20.7* 30.7*  MCV 86.3 84.6  PLT PLATELET CLUMPS NOTED ON SMEAR, UNABLE TO ESTIMATE 81*   Basic Metabolic Panel: Recent Labs  Lab 05/21/22 1833 05/22/22 0637  NA 130* 134*  K 4.5 3.8  CL 102 103  CO2 22 23  GLUCOSE 293* 137*  BUN 36* 32*  CREATININE 1.21* 1.15*  CALCIUM 8.3* 8.0*   GFR: Estimated Creatinine Clearance: 30.1 mL/min (A) (by C-G formula based on SCr of 1.15 mg/dL (H)). Liver Function Tests: Recent Labs  Lab 05/21/22 1833  AST 18  ALT 12  ALKPHOS 114  BILITOT 0.6  PROT 5.9*  ALBUMIN 3.6   No results for input(s): "LIPASE", "AMYLASE" in the last 168 hours. No results for input(s): "AMMONIA" in the last 168 hours. Coagulation Profile: Recent Labs  Lab 05/21/22 1834  INR 1.1   Cardiac Enzymes: No results for input(s): "CKTOTAL", "CKMB", "CKMBINDEX", "TROPONINI" in the last 168 hours. BNP (last 3 results) No results for input(s): "PROBNP" in the last 8760 hours. HbA1C: No results for input(s): "HGBA1C" in the last 72 hours. CBG: Recent Labs  Lab 05/21/22 2249  GLUCAP 195*   Lipid Profile: No results for input(s): "CHOL", "HDL", "LDLCALC", "TRIG", "CHOLHDL", "LDLDIRECT" in the last 72 hours. Thyroid Function Tests: No results for input(s): "TSH", "T4TOTAL", "FREET4", "T3FREE",  "THYROIDAB" in the last 72 hours. Anemia Panel: Recent Labs    05/21/22 2123  VITAMINB12 391  FOLATE 28.0  FERRITIN 12  TIBC 462*  IRON 11*  RETICCTPCT 2.7   Urine analysis:    Component Value Date/Time   COLORURINE COLORLESS (A) 11/26/2021 2215   APPEARANCEUR CLEAR (A) 11/26/2021 2215   LABSPEC 1.003 (L) 11/26/2021 2215   PHURINE 5.0 11/26/2021 2215   GLUCOSEU NEGATIVE 11/26/2021 2215   HGBUR NEGATIVE 11/26/2021 2215   BILIRUBINUR NEGATIVE 11/26/2021 2215   BILIRUBINUR negative 05/26/2019 1558   KETONESUR NEGATIVE 11/26/2021 2215   PROTEINUR NEGATIVE 11/26/2021 2215   UROBILINOGEN 0.2 05/26/2019 1558   UROBILINOGEN 1.0 05/25/2009 0717   NITRITE NEGATIVE 11/26/2021 2215   LEUKOCYTESUR NEGATIVE 11/26/2021 2215   Sepsis Labs: '@LABRCNTIP'$ (procalcitonin:4,lacticidven:4)  No results found for this or any previous visit (from the past 240 hour(s)).       Radiology Studies last 96 hours: DG Chest Portable 1 View  Result Date: 05/21/2022 CLINICAL DATA:  Chest pain.  Worsening dyspnea for 2 weeks. EXAM: PORTABLE CHEST 1 VIEW COMPARISON:  Chest radiograph 03/13/2022 and earlier;  CT chest 05/16/2022. FINDINGS: The heart size and mediastinal contours are within normal limits. Aortic calcifications. Bibasilar interstitial opacities. Small patchy airspace opacity in the right costophrenic angle. No pleural effusion or pneumothorax. Anterior cervical spinal fusion. IMPRESSION: Bibasilar interstitial opacities and small patchy airspace opacity at the right costophrenic angle may reflect subsegmental atelectasis, aspiration related changes or possibly infection in the appropriate clinical context. Aortic Atherosclerosis (ICD10-I70.0). Electronically Signed   By: Ileana Roup M.D.   On: 05/21/2022 18:57            LOS: 1 day     Emeterio Reeve, DO [facility] 05/22/2022, 8:08 AM   Staff may message me via secure chat in [place]  but this may not receive immediate  response,  please page for urgent matters!  If 7PM-7AM, please contact night-coverage [email or web address]  Dictation software was used to generate the above note. Typos may occur and escape review, as with typed/written notes. Please contact Dr Sheppard Coil directly for clarity if needed.

## 2022-05-22 NOTE — Assessment & Plan Note (Signed)
-   This is acute on chronic anemia. - She will be typed and crossmatch and transfuse 2 units of packed red blood cells especially given her acute CHF and NSTEMI.  I discussed the benefits and risks of transfusion and she agreed to proceed with it.

## 2022-05-22 NOTE — Consult Note (Signed)
Cardiology Consultation:   Patient ID: Jasmine Webster MRN: [id number]; DOB: 02-Jun-1926  Admit date: 05/21/2022 Date of Consult: 05/22/2022  PCP:  Jerrol Banana., MD   [facility] Providers Cardiologist: Dr. Clyde Lundborg here to update MD or APP on Care Team, Refresh:1}     Patient Profile:   Jasmine Webster is a 88 y.o. female with a hx of severe AS, who is being seen 05/22/2022 for the evaluation of chest pain at the request of Dr. Sidney Ace.  History of Present Illness:   Jasmine Webster is a 88 year old female with history of severe aortic stenosis, nonobstructive CAD, pulmonary hypertension, hyperlipidemia, hypertension presenting with chest pain and shortness of breath.  Patient has had on and off chest pain for the past several weeks.  Chest pains usually occur with exertion and relieved with rest.  Yesterday, her chest pain seemed to persist lasting over 30 minutes prompting her to call EMS.  Also endorsed shortness of breath with exertion.  She has a known history of aortic stenosis, currently being evaluated for TAVR procedure on outpatient basis.  She denies blood in her urine or stool.  Denies constipation.  Last echocardiogram 07/2021 EF 50 to 55%, left heart cath 04/2022 showed 60% ramus, 30% mid LAD.  Upon admission, EKG showed sinus rhythm with left bundle branch block.  Telemetry shows sinus rhythm.  Initial hemoglobin was 6.1, patient underwent transfusion with 2 units packed red blood cells, last hemoglobin 9.7.  Initial troponins 116, 523, 4218.  Patient started on heparin drip per ACS protocol.  Currently denies chest pain upon my evaluation   Past Medical History:  Diagnosis Date   Arthritis    Cataract    cataract removal bilaterally approx 20 years ago   Chronic back pain    Coronary artery disease    COVID-19 virus infection 12/2020   Diabetes mellitus without complication (HCC)    Heart murmur    Hyperlipidemia    Hypertension     Past Surgical  History:  Procedure Laterality Date   ABDOMINAL HYSTERECTOMY     APPENDECTOMY     BACK SURGERY     three   CARDIAC CATHETERIZATION     6+yrs no stents Dr. Verneda Skill   CATARACT EXTRACTION     Webster/LEFT HEART CATH AND CORONARY ANGIOGRAPHY N/A 04/26/2022   Procedure: Webster/LEFT HEART CATH AND CORONARY ANGIOGRAPHY;  Surgeon: Wellington Hampshire, MD;  Location: [place] [facility];  Service: Cardiovascular;  Laterality: N/A;   THORACOLUMBAR SYRINGO SHUNT     TONSILLECTOMY AND ADENOIDECTOMY       Home Medications:  Prior to Admission medications   Medication Sig Start Date End Date Taking? Authorizing Provider  ALPRAZolam Duanne Moron) 0.5 MG tablet TAKE 1 TABLET BY MOUTH EVERY DAY AT BEDTIME AS NEEDED 05/18/22  Yes Drubel, Ria Comment, PA-C  Artificial Tear Ointment (DRY EYES OP) Apply 1 drop to eye daily.   Yes [provider]  aspirin EC 81 MG tablet Take 81 mg by mouth daily.   Yes [provider]  co-enzyme Q-10 30 MG capsule Take 30 mg by mouth daily.   Yes [provider]  furosemide (LASIX) 20 MG tablet TAKE 1 TABLET (20 MG TOTAL) BY MOUTH AS DIRECTED. TAKE 1 TABLET (20 MG) 2-3 TIMES A WEEK 02/01/22  Yes Gollan, Kathlene November, MD  LANTUS SOLOSTAR 100 UNIT/ML Solostar Pen INJECT 25 UNITS SUBCUTANEOUSLY Patient taking differently: 25 Units daily. INJECT 25 UNITS SUBCUTANEOUSLY 03/07/22  Yes Miguel Aschoff  Kaylyn Lim., MD  lisinopril (ZESTRIL) 20 MG tablet Take 1 tablet (20 mg total) by mouth daily. 12/13/21  Yes Myles Gip, DO  Magnesium 400 MG CAPS Take 400 mg by mouth daily.   Yes [provider]  Multiple Vitamins-Minerals (PRESERVISION AREDS) CAPS Take 1 capsule by mouth 2 (two) times daily.    Yes [provider]  Omega-3 Fatty Acids (FISH OIL PO) Take by mouth daily.   Yes [provider]  omeprazole (PRILOSEC) 20 MG capsule Take 20 mg by mouth daily. 01/18/22  Yes [provider]  pantoprazole (PROTONIX) 40 MG tablet Take 1  tablet (40 mg total) by mouth 2 (two) times daily before a meal. 11/28/21  Yes Fritzi Mandes, MD  potassium chloride (KLOR-CON) 10 MEQ tablet TAKE 1 TABLET BY MOUTH 2-3 TIMES A WEEK WITH FUROSEMIDE AS DIRECTED 02/02/22  Yes [provider]  Turmeric 500 MG CAPS Take by mouth daily.   Yes [provider]  vitamin B-12 (CYANOCOBALAMIN) 500 MCG tablet Take 500 mcg by mouth once a week.   Yes [provider]  nitroGLYCERIN (NITROSTAT) 0.4 MG SL tablet Place 1 tablet (0.4 mg total) under the tongue every 5 (five) minutes as needed for chest pain. 07/02/21   Minna Merritts, MD  ondansetron (ZOFRAN) 4 MG tablet TAKE 1 TABLET BY MOUTH EVERY 8 HOURS AS NEEDED FOR NAUSEA AND VOMITING 04/22/22   Minna Merritts, MD    Inpatient Medications: Scheduled Meds:  furosemide  20 mg Intravenous Q12H   insulin aspart  0-9 Units Subcutaneous TID AC & HS   Continuous Infusions:  heparin 850 Units/hr (05/22/22 0636)   PRN Meds: acetaminophen **OR** acetaminophen, magnesium hydroxide, ondansetron **OR** ondansetron (ZOFRAN) IV, traZODone  Allergies:    Allergies  Allergen Reactions   Etodolac Nausea Only, Other (See Comments) and Anxiety    Dizziness, too   Naproxen Other (See Comments) and Anxiety    Gave her an ulcer after taking it for years   Statins Other (See Comments)    No energy and myalgias   Latex Rash   Terbinafine Other (See Comments) and Rash    "Maybe made me nervous"   Terbinafine Hcl Rash    Social History:   Social History   Socioeconomic History   Marital status: Widowed    Spouse name: Not on file   Number of children: 2   Years of education: Not on file   Highest education level: 8th grade  Occupational History   Occupation: retired  Tobacco Use   Smoking status: Never   Smokeless tobacco: Never  Vaping Use   Vaping Use: Never used  Substance and Sexual Activity   Alcohol use: No    Alcohol/week: 0.0 standard drinks of alcohol   Drug use: No    Sexual activity: Not Currently  Other Topics Concern   Not on file  Social History Narrative   Lives with daughter, Jasmine Webster   Social Determinants of Health   Financial Resource Strain: Low Risk  (07/12/2020)   Overall Financial Resource Strain (CARDIA)    Difficulty of Paying Living Expenses: Not hard at all  Food Insecurity: No Food Insecurity (07/12/2020)   Hunger Vital Sign    Worried About Running Out of Food in the Last Year: Never true    [place] in the Last Year: Never true  Transportation Needs: No Transportation Needs (07/12/2020)   PRAPARE - Hydrologist (Medical):  No    Lack of Transportation (Non-Medical): No  Physical Activity: Inactive (07/12/2020)   Exercise Vital Sign    Days of Exercise per Week: 0 days    Minutes of Exercise per Session: 0 min  Stress: No Stress Concern Present (07/12/2020)   [place]    Feeling of Stress : Not at all  Social Connections: Moderately Isolated (07/12/2020)   Social Connection and Isolation Panel [NHANES]    Frequency of Communication with Friends and Family: More than three times a week    Frequency of Social Gatherings with Friends and Family: More than three times a week    Attends Religious Services: More than 4 times per year    Active Member of [employer] or Organizations: No    Attends Archivist Meetings: Never    Marital Status: Widowed  Intimate Partner Violence: Not At Risk (07/12/2020)   Humiliation, Afraid, Rape, and Kick questionnaire    Fear of Current or Ex-Partner: No    Emotionally Abused: No    Physically Abused: No    Sexually Abused: No    Family History:    Family History  Problem Relation Age of Onset   Heart disease Mother        died from MI   Heart attack Mother    Heart disease Sister    Sudden death Brother        shot and beaten to death during home invasion   Stroke Sister     Sudden death Sister        MVA   Cancer Brother        died from lung cancer   Sudden death Sister        19 days old   Cancer Brother        died from lung cancer   Stroke Brother        cause of death   Diabetes Father    Stroke Father    Pneumonia Father      ROS:  Please see the history of present illness.  ` All other ROS reviewed and negative.     Physical Exam/Data:   Vitals:   05/22/22 0300 05/22/22 0345 05/22/22 0700 05/22/22 1140  BP: (!) 138/48 (!) 138/48 (!) 150/59 (!) 146/67  Pulse: 75 71 66 80  Resp: (!) '23 17 15 19  '$ Temp: 98.3 F (36.8 C) 98.5 F (36.9 C) 98 F (36.7 C) 98.4 F (36.9 C)  TempSrc: Oral Oral Oral Oral  SpO2: 99%  99% 100%  Weight:      Height:        Intake/Output Summary (Last 24 hours) at 05/22/2022 1417 Last data filed at 05/22/2022 0630 Gross per 24 hour  Intake 1590 ml  Output 900 ml  Net 690 ml      05/21/2022    6:30 PM 05/10/2022    1:24 PM 05/08/2022   11:43 AM  Last 3 Weights  Weight (lbs) 162 lb 7.7 oz 152 lb 151 lb 6 oz  Weight (kg) 73.7 kg 68.947 kg 68.663 kg     Body mass index is 27.89 kg/m.  General:  Well nourished, well developed, in no acute distress HEENT: normal Neck: no JVD Vascular: No carotid bruits; Distal pulses 2+ bilaterally Cardiac:  normal S1, S2; RRR; 3/6 systolic murmur Lungs:  clear to auscultation bilaterally, no wheezing, rhonchi or rales  Abd: soft, nontender, no hepatomegaly  Ext: trace edema  Musculoskeletal:  No deformities, BUE and BLE strength normal and equal Skin: warm and dry  Neuro:  CNs 2-12 intact, no focal abnormalities noted Psych:  Normal affect   EKG:  The EKG was personally reviewed and demonstrates: Sinus rhythm, left bundle branch block Telemetry:  Telemetry was personally reviewed and demonstrates: Sinus rhythm  Relevant CV Studies: Left heart cath last month 60% ramus branch stenosis, 30% LAD.  Echo 07/2021 EF 50 to 55%  Laboratory Data:  High Sensitivity Troponin:    Recent Labs  Lab 05/21/22 1833 05/21/22 2033 05/22/22 0637  TROPONINIHS 116* 523* 4,218*     Chemistry Recent Labs  Lab 05/21/22 1833 05/22/22 0637  NA 130* 134*  K 4.5 3.8  CL 102 103  CO2 22 23  GLUCOSE 293* 137*  BUN 36* 32*  CREATININE 1.21* 1.15*  CALCIUM 8.3* 8.0*  GFRNONAA 42* 44*  ANIONGAP 6 8    Recent Labs  Lab 05/21/22 1833  PROT 5.9*  ALBUMIN 3.6  AST 18  ALT 12  ALKPHOS 114  BILITOT 0.6   Lipids No results for input(s): "CHOL", "TRIG", "HDL", "LABVLDL", "LDLCALC", "CHOLHDL" in the last 168 hours.  Hematology Recent Labs  Lab 05/21/22 1833 05/21/22 2123 05/22/22 0637  WBC 7.8  --  8.4  RBC 2.40* 2.65* 3.63*  HGB 6.1*  --  9.7*  HCT 20.7*  --  30.7*  MCV 86.3  --  84.6  MCH 25.4*  --  26.7  MCHC 29.5*  --  31.6  RDW 15.2  --  14.8  PLT PLATELET CLUMPS NOTED ON SMEAR, UNABLE TO ESTIMATE  --  81*   Thyroid No results for input(s): "TSH", "FREET4" in the last 168 hours.  BNP Recent Labs  Lab 05/21/22 1833  BNP 300.3*    DDimer No results for input(s): "DDIMER" in the last 168 hours.   Radiology/Studies:  DG Chest Portable 1 View  Result Date: 05/21/2022 CLINICAL DATA:  Chest pain.  Worsening dyspnea for 2 weeks. EXAM: PORTABLE CHEST 1 VIEW COMPARISON:  Chest radiograph 03/13/2022 and earlier; CT chest 05/16/2022. FINDINGS: The heart size and mediastinal contours are within normal limits. Aortic calcifications. Bibasilar interstitial opacities. Small patchy airspace opacity in the Webster costophrenic angle. No pleural effusion or pneumothorax. Anterior cervical spinal fusion. IMPRESSION: Bibasilar interstitial opacities and small patchy airspace opacity at the Webster costophrenic angle may reflect subsegmental atelectasis, aspiration related changes or possibly infection in the appropriate clinical context. Aortic Atherosclerosis (ICD10-I70.0). Electronically Signed   By: Ileana Roup M.D.   On: 05/21/2022 18:57     Assessment and Plan:    Chest pain, non obstructive CAD, NSTEMI -Currently denies chest pain -Trend troponins until peak -Continue heparin for now to complete 48hr course, recheck H&H later today and tomorrow -Anemia, initial hemoglobin 6.1 s/p packed red blood cells. -Stop heparin if there is significant drop in H&H -Not a candidate for invasive procedures until etiology for anemia is identified and managed. -Echocardiogram was ordered, will review -LHC last month with no obstructive dz (60% Ramus, 30% LAD)  2.  Severe aortic stenosis -TAVR work-up being planned as outpatient -Reduce IV Lasix to 20 mg daily  3.  Anemia s/p 2 units prbc -Etiology unclear -Work-up as per medicine team -Transfuse to keep hemoglobin greater than 8  Total encounter time more than 85 minutes  Greater than 50% was spent in counseling and coordination of care with the patient   Signed, Kate Sable, MD  05/22/2022 2:17 PM

## 2022-05-22 NOTE — Assessment & Plan Note (Signed)
-   This is likely the main contributor for her acute CHF and possibly for ischemia and non-STEMI. - Cardiology consult will be obtained and 2D echo to assess progression of her aortic valve stenosis. - She was expected to have evaluation for TAVR in August.

## 2022-05-22 NOTE — Progress Notes (Signed)
ANTICOAGULATION CONSULT NOTE - Initial Consult  Pharmacy Consult for Heparin  Indication: chest pain/ACS  Allergies  Allergen Reactions   Etodolac Nausea Only, Other (See Comments) and Anxiety    Dizziness, too   Naproxen Other (See Comments) and Anxiety    Gave her an ulcer after taking it for years   Statins Other (See Comments)    No energy and myalgias   Latex Rash   Terbinafine Other (See Comments) and Rash    "Maybe made me nervous"   Terbinafine Hcl Rash    Patient Measurements: Height: '5\' 4"'$  (162.6 cm) Weight: 73.7 kg (162 lb 7.7 oz) IBW/kg (Calculated) : 54.7 Heparin Dosing Weight: 70 kg   Vital Signs: Temp: 98 F (36.7 C) (07/05 0700) Temp Source: Oral (07/05 0700) BP: 150/59 (07/05 0700) Pulse Rate: 66 (07/05 0700)  Labs: Recent Labs    05/21/22 1833 05/21/22 1834 05/21/22 2033 05/22/22 0637  HGB 6.1*  --   --  9.7*  HCT 20.7*  --   --  30.7*  PLT PLATELET CLUMPS NOTED ON SMEAR, UNABLE TO ESTIMATE  --   --  81*  APTT  --  31  --   --   LABPROT  --  13.6  --   --   INR  --  1.1  --   --   HEPARINUNFRC  --   --   --  0.66  CREATININE 1.21*  --   --  1.15*  TROPONINIHS 116*  --  523*  --      Estimated Creatinine Clearance: 30.1 mL/min (A) (by C-G formula based on SCr of 1.15 mg/dL (H)).   Medical History: Past Medical History:  Diagnosis Date   Arthritis    Cataract    cataract removal bilaterally approx 20 years ago   Chronic back pain    Coronary artery disease    COVID-19 virus infection 12/2020   Diabetes mellitus without complication (HCC)    Heart murmur    Hyperlipidemia    Hypertension     Medications:  (Not in a hospital admission)  Assessment: Pharmacy consulted to dose heparin in this 88 year old female admitted with ACS/NSTEMI.  No prior anticoag noted.   7/5 0637 HL 0.66    Goal of Therapy:  Heparin level 0.3-0.7 units/ml Monitor platelets by anticoagulation protocol: Yes   Plan:  Heparin level is therapeutic.  Will continue heparin infusion at 850 units/hr. Recheck heparin level in 8 hours. CBC daily while on heparin.    Oswald Hillock, PharmD, BCPS 05/22/2022,7:41 AM

## 2022-05-22 NOTE — Progress Notes (Signed)
*  PRELIMINARY RESULTS* Echocardiogram 2D Echocardiogram has been performed.  Sherrie Sport 05/22/2022, 10:06 AM

## 2022-05-22 NOTE — ED Notes (Signed)
Per md only transfuse 2 units of blood dr Sidney Ace md

## 2022-05-23 DIAGNOSIS — E785 Hyperlipidemia, unspecified: Secondary | ICD-10-CM

## 2022-05-23 DIAGNOSIS — I350 Nonrheumatic aortic (valve) stenosis: Secondary | ICD-10-CM | POA: Diagnosis not present

## 2022-05-23 DIAGNOSIS — I5031 Acute diastolic (congestive) heart failure: Secondary | ICD-10-CM | POA: Diagnosis not present

## 2022-05-23 DIAGNOSIS — I259 Chronic ischemic heart disease, unspecified: Secondary | ICD-10-CM | POA: Diagnosis not present

## 2022-05-23 DIAGNOSIS — I10 Essential (primary) hypertension: Secondary | ICD-10-CM | POA: Diagnosis not present

## 2022-05-23 DIAGNOSIS — Z794 Long term (current) use of insulin: Secondary | ICD-10-CM

## 2022-05-23 DIAGNOSIS — E119 Type 2 diabetes mellitus without complications: Secondary | ICD-10-CM

## 2022-05-23 LAB — TYPE AND SCREEN
ABO/RH(D): O POS
Antibody Screen: NEGATIVE
Unit division: 0
Unit division: 0

## 2022-05-23 LAB — BPAM RBC
Blood Product Expiration Date: 202308052359
Blood Product Expiration Date: 202308052359
ISSUE DATE / TIME: 202307042355
ISSUE DATE / TIME: 202307050339
Unit Type and Rh: 5100
Unit Type and Rh: 5100

## 2022-05-23 LAB — HEPARIN LEVEL (UNFRACTIONATED): Heparin Unfractionated: 0.36 IU/mL (ref 0.30–0.70)

## 2022-05-23 LAB — BASIC METABOLIC PANEL
Anion gap: 7 (ref 5–15)
BUN: 28 mg/dL — ABNORMAL HIGH (ref 8–23)
CO2: 25 mmol/L (ref 22–32)
Calcium: 8.4 mg/dL — ABNORMAL LOW (ref 8.9–10.3)
Chloride: 102 mmol/L (ref 98–111)
Creatinine, Ser: 1.23 mg/dL — ABNORMAL HIGH (ref 0.44–1.00)
GFR, Estimated: 41 mL/min — ABNORMAL LOW (ref >60)
Glucose, Bld: 165 mg/dL — ABNORMAL HIGH (ref 70–99)
Potassium: 3.3 mmol/L — ABNORMAL LOW (ref 3.5–5.1)
Sodium: 134 mmol/L — ABNORMAL LOW (ref 135–145)

## 2022-05-23 LAB — CBC
HCT: 31.4 % — ABNORMAL LOW (ref 36.0–46.0)
Hemoglobin: 9.9 g/dL — ABNORMAL LOW (ref 12.0–15.0)
MCH: 26.5 pg (ref 26.0–34.0)
MCHC: 31.5 g/dL (ref 30.0–36.0)
MCV: 84 fL (ref 80.0–100.0)
RBC: 3.74 MIL/uL — ABNORMAL LOW (ref 3.87–5.11)
RDW: 15.1 % (ref 11.5–15.5)
WBC: 6 10*3/uL (ref 4.0–10.5)
nRBC: 0 % (ref 0.0–0.2)

## 2022-05-23 LAB — GLUCOSE, CAPILLARY
Glucose-Capillary: 205 mg/dL — ABNORMAL HIGH (ref 70–99)
Glucose-Capillary: 146 mg/dL — ABNORMAL HIGH (ref 70–99)
Glucose-Capillary: 159 mg/dL — ABNORMAL HIGH (ref 70–99)
Glucose-Capillary: 176 mg/dL — ABNORMAL HIGH (ref 70–99)

## 2022-05-23 LAB — PREPARE RBC (CROSSMATCH)

## 2022-05-23 MED ORDER — POTASSIUM CHLORIDE CRYS ER 20 MEQ PO TBCR
40.0000 meq | EXTENDED_RELEASE_TABLET | Freq: Once | ORAL | Status: AC
  Administered 2022-05-23: 40 meq via ORAL
  Filled 2022-05-23: qty 2

## 2022-05-23 MED ORDER — PANTOPRAZOLE SODIUM 40 MG PO TBEC
40.0000 mg | DELAYED_RELEASE_TABLET | Freq: Two times a day (BID) | ORAL | Status: DC
  Administered 2022-05-23 – 2022-05-24 (×2): 40 mg via ORAL
  Filled 2022-05-23 (×2): qty 1

## 2022-05-23 NOTE — Progress Notes (Signed)
ANTICOAGULATION CONSULT NOTE  Pharmacy Consult for Heparin  Indication: chest pain/ACS  Allergies  Allergen Reactions   Etodolac Nausea Only, Other (See Comments) and Anxiety    Dizziness, too   Naproxen Other (See Comments) and Anxiety    Gave her an ulcer after taking it for years   Statins Other (See Comments)    No energy and myalgias   Latex Rash   Terbinafine Other (See Comments) and Rash    "Maybe made me nervous"   Terbinafine Hcl Rash    Patient Measurements: Height: '5\' 4"'$  (162.6 cm) Weight: 73.5 kg (162 lb 0.6 oz) IBW/kg (Calculated) : 54.7 Heparin Dosing Weight: 70 kg   Vital Signs: Temp: 97.3 F (36.3 C) (07/06 0719) Temp Source: Oral (07/06 0435) BP: 135/44 (07/06 0719) Pulse Rate: 75 (07/06 0719)  Labs: Recent Labs    05/21/22 1833 05/21/22 1834 05/21/22 2033 05/22/22 0637 05/22/22 1435 05/22/22 1915 05/23/22 0634  HGB 6.1*  --   --  9.7* 10.2*  --   --   HCT 20.7*  --   --  30.7* 32.2*  --   --   PLT PLATELET CLUMPS NOTED ON SMEAR, UNABLE TO ESTIMATE  --   --  81*  --   --   --   APTT  --  31  --   --   --   --   --   LABPROT  --  13.6  --   --   --   --   --   INR  --  1.1  --   --   --   --   --   HEPARINUNFRC  --   --   --  0.66 0.55  --  0.36  CREATININE 1.21*  --   --  1.15*  --   --  1.23*  TROPONINIHS 116*  --    < > 4,218* 3,556* 2,929*  --    < > = values in this interval not displayed.     Estimated Creatinine Clearance: 28.1 mL/min (A) (by C-G formula based on SCr of 1.23 mg/dL (H)).   Medical History: Past Medical History:  Diagnosis Date   Arthritis    Cataract    cataract removal bilaterally approx 20 years ago   Chronic back pain    Coronary artery disease    COVID-19 virus infection 12/2020   Diabetes mellitus without complication (HCC)    Heart murmur    Hyperlipidemia    Hypertension     Medications:  Medications Prior to Admission  Medication Sig Dispense Refill Last Dose   ALPRAZolam (XANAX) 0.5 MG tablet  TAKE 1 TABLET BY MOUTH EVERY DAY AT BEDTIME AS NEEDED 30 tablet 0 05/20/2022   Artificial Tear Ointment (DRY EYES OP) Apply 1 drop to eye daily.   05/21/2022   aspirin EC 81 MG tablet Take 81 mg by mouth daily.   05/21/2022   co-enzyme Q-10 30 MG capsule Take 30 mg by mouth daily.   05/21/2022   furosemide (LASIX) 20 MG tablet TAKE 1 TABLET (20 MG TOTAL) BY MOUTH AS DIRECTED. TAKE 1 TABLET (20 MG) 2-3 TIMES A WEEK 36 tablet 2 05/20/2022   LANTUS SOLOSTAR 100 UNIT/ML Solostar Pen INJECT 25 UNITS SUBCUTANEOUSLY (Patient taking differently: 25 Units daily. INJECT 25 UNITS SUBCUTANEOUSLY) 45 mL 0 05/21/2022   lisinopril (ZESTRIL) 20 MG tablet Take 1 tablet (20 mg total) by mouth daily. 90 tablet 1 05/21/2022   Magnesium 400 MG  CAPS Take 400 mg by mouth daily.   05/21/2022   Multiple Vitamins-Minerals (PRESERVISION AREDS) CAPS Take 1 capsule by mouth 2 (two) times daily.    05/21/2022   Omega-3 Fatty Acids (FISH OIL PO) Take by mouth daily.   05/21/2022   omeprazole (PRILOSEC) 20 MG capsule Take 20 mg by mouth daily.   05/21/2022   pantoprazole (PROTONIX) 40 MG tablet Take 1 tablet (40 mg total) by mouth 2 (two) times daily before a meal. 60 tablet 1 05/21/2022   potassium chloride (KLOR-CON) 10 MEQ tablet TAKE 1 TABLET BY MOUTH 2-3 TIMES A WEEK WITH FUROSEMIDE AS DIRECTED   05/20/2022   Turmeric 500 MG CAPS Take by mouth daily.   05/21/2022   vitamin B-12 (CYANOCOBALAMIN) 500 MCG tablet Take 500 mcg by mouth once a week.   Past Week   nitroGLYCERIN (NITROSTAT) 0.4 MG SL tablet Place 1 tablet (0.4 mg total) under the tongue every 5 (five) minutes as needed for chest pain. 25 tablet 3 prn at prn   ondansetron (ZOFRAN) 4 MG tablet TAKE 1 TABLET BY MOUTH EVERY 8 HOURS AS NEEDED FOR NAUSEA AND VOMITING 20 tablet 1 prn at prn    Assessment: Pharmacy consulted to dose heparin in this 88 year old female admitted with ACS/NSTEMI.  No prior anticoag noted.   7/5 0637 HL 0.66  7/5 1435 HL 0.55 7/6 0634 HL 0.36    Goal of Therapy:   Heparin level 0.3-0.7 units/ml Monitor platelets by anticoagulation protocol: Yes   Plan:  Heparin remains therapeutic. Will continue heparin infusion at 850 units/hr. Recheck heparin level and CBC with AM labs. Heparin for 48 hours.   Oswald Hillock, PharmD, BCPS 05/23/2022,7:32 AM

## 2022-05-23 NOTE — Consult Note (Signed)
Consultation  Referring Provider:     Dr Sidney Ace Admit date 05/21/22 Consult date     05/22/22    Reason for Consultation: melena/acute on chronic anemia             HPI:   Jasmine Webster is a 88 y.o. female with history of DM, CAD, severe AS who presented to ED with chest pain, sob/increasing DOE over the last 2-3w. She was  found to be very anemic, received 2u prbc andwas heparinized as her troponins were elevated- started at 116, then 523, then 4218. BNP elevated. She has been evaluated by cardiology and the heart failure clinic. She is planning on having TAVR in the near future as she cannot walk more than a small distance without chest pain/sob. States she was nauseated with her chest pain but this has improved. Chest pain has improved. Had a black stool since admission but says that was the only one. Denies abdominal pain, other black stools. Hgb has improved after transfusion. She is taking a PPI at home although has not been taking this for max benefit. States she has history of gastric ulcer several years ago. She was using aleve fairly frequently for her chronic back pain until her January hospitalization for pneumonia, at which time she was noted to have renal insufficiency. States she has had maybe one aleve monthly since then. Denies all further GI concerns. Denies any family history of colorectal/gastric cancer.   PREVIOUS ENDOSCOPIES:            Colonoscopy 2008- noted to be normal but full report not available. EGD 2008- by patient report; report not available.  Past Medical History:  Diagnosis Date   Arthritis    Cataract    cataract removal bilaterally approx 20 years ago   Chronic back pain    Coronary artery disease    COVID-19 virus infection 12/2020   Diabetes mellitus without complication (HCC)    Heart murmur    Hyperlipidemia    Hypertension     Past Surgical History:  Procedure Laterality Date   ABDOMINAL HYSTERECTOMY     APPENDECTOMY     BACK SURGERY     three    CARDIAC CATHETERIZATION     6+yrs no stents Dr. Verneda Skill   CATARACT EXTRACTION     RIGHT/LEFT HEART CATH AND CORONARY ANGIOGRAPHY N/A 04/26/2022   Procedure: RIGHT/LEFT HEART CATH AND CORONARY ANGIOGRAPHY;  Surgeon: Wellington Hampshire, MD;  Location: [facility];  Service: Cardiovascular;  Laterality: N/A;   THORACOLUMBAR SYRINGO SHUNT     TONSILLECTOMY AND ADENOIDECTOMY      Family History  Problem Relation Age of Onset   Heart disease Mother        died from MI   Heart attack Mother    Heart disease Sister    Sudden death Brother        shot and beaten to death during home invasion   Stroke Sister    Sudden death Sister        MVA   Cancer Brother        died from lung cancer   Sudden death Sister        55 days old   Cancer Brother        died from lung cancer   Stroke Brother        cause of death   Diabetes Father    Stroke Father    Pneumonia Father  Social History   Tobacco Use   Smoking status: Never   Smokeless tobacco: Never  Vaping Use   Vaping Use: Never used  Substance Use Topics   Alcohol use: No    Alcohol/week: 0.0 standard drinks of alcohol   Drug use: No    Prior to Admission medications   Medication Sig Start Date End Date Taking? Authorizing Provider  ALPRAZolam Duanne Moron) 0.5 MG tablet TAKE 1 TABLET BY MOUTH EVERY DAY AT BEDTIME AS NEEDED 05/18/22  Yes Drubel, Ria Comment, PA-C  Artificial Tear Ointment (DRY EYES OP) Apply 1 drop to eye daily.   Yes [provider]  aspirin EC 81 MG tablet Take 81 mg by mouth daily.   Yes [provider]  co-enzyme Q-10 30 MG capsule Take 30 mg by mouth daily.   Yes [provider]  furosemide (LASIX) 20 MG tablet TAKE 1 TABLET (20 MG TOTAL) BY MOUTH AS DIRECTED. TAKE 1 TABLET (20 MG) 2-3 TIMES A WEEK 02/01/22  Yes Gollan, Kathlene November, MD  LANTUS SOLOSTAR 100 UNIT/ML Solostar Pen INJECT 25 UNITS SUBCUTANEOUSLY Patient taking differently: 25 Units daily. INJECT 25 UNITS  SUBCUTANEOUSLY 03/07/22  Yes Jerrol Banana., MD  lisinopril (ZESTRIL) 20 MG tablet Take 1 tablet (20 mg total) by mouth daily. 12/13/21  Yes Myles Gip, DO  Magnesium 400 MG CAPS Take 400 mg by mouth daily.   Yes [provider]  Multiple Vitamins-Minerals (PRESERVISION AREDS) CAPS Take 1 capsule by mouth 2 (two) times daily.    Yes [provider]  Omega-3 Fatty Acids (FISH OIL PO) Take by mouth daily.   Yes [provider]  omeprazole (PRILOSEC) 20 MG capsule Take 20 mg by mouth daily. 01/18/22  Yes [provider]  pantoprazole (PROTONIX) 40 MG tablet Take 1 tablet (40 mg total) by mouth 2 (two) times daily before a meal. 11/28/21  Yes Fritzi Mandes, MD  potassium chloride (KLOR-CON) 10 MEQ tablet TAKE 1 TABLET BY MOUTH 2-3 TIMES A WEEK WITH FUROSEMIDE AS DIRECTED 02/02/22  Yes [provider]  Turmeric 500 MG CAPS Take by mouth daily.   Yes [provider]  vitamin B-12 (CYANOCOBALAMIN) 500 MCG tablet Take 500 mcg by mouth once a week.   Yes [provider]  nitroGLYCERIN (NITROSTAT) 0.4 MG SL tablet Place 1 tablet (0.4 mg total) under the tongue every 5 (five) minutes as needed for chest pain. 07/02/21   Minna Merritts, MD  ondansetron (ZOFRAN) 4 MG tablet TAKE 1 TABLET BY MOUTH EVERY 8 HOURS AS NEEDED FOR NAUSEA AND VOMITING 04/22/22   Minna Merritts, MD    Current Facility-Administered Medications  Medication Dose Route Frequency Provider Last Rate Last Admin   acetaminophen (TYLENOL) tablet 650 mg  650 mg Oral Q6H PRN Mansy, Jan A, MD       Or   acetaminophen (TYLENOL) suppository 650 mg  650 mg Rectal Q6H PRN Mansy, Arvella Merles, MD       ALPRAZolam Duanne Moron) tablet 0.5 mg  0.5 mg Oral QHS PRN Emeterio Reeve, DO       furosemide (LASIX) injection 20 mg  20 mg Intravenous Daily Kate Sable, MD   20 mg at 05/23/22 1610   heparin ADULT infusion 100 units/mL (25000 units/259m)  850 Units/hr Intravenous Continuous  POswald Hillock RPH 8.5 mL/hr at 05/23/22 0517 850 Units/hr at 05/23/22 0517   insulin aspart (novoLOG) injection 0-9 Units  0-9 Units Subcutaneous TID [facility] Mansy, Jan  A, MD   1 Units at 05/23/22 1234   lisinopril (ZESTRIL) tablet 20 mg  20 mg Oral Daily Emeterio Reeve, DO   20 mg at 05/23/22 1194   magnesium hydroxide (MILK OF MAGNESIA) suspension 30 mL  30 mL Oral Daily PRN Mansy, Jan A, MD       ondansetron [facility]) tablet 4 mg  4 mg Oral Q6H PRN Mansy, Jan A, MD       Or   ondansetron [facility]) injection 4 mg  4 mg Intravenous Q6H PRN Mansy, Jan A, MD       traZODone (DESYREL) tablet 25 mg  25 mg Oral QHS PRN Mansy, Jan A, MD   25 mg at 05/22/22 2110    Allergies as of 05/21/2022 - Review Complete 05/21/2022  Allergen Reaction Noted   Etodolac Nausea Only, Other (See Comments), and Anxiety 03/23/2015   Naproxen Other (See Comments) and Anxiety 03/23/2015   Statins Other (See Comments) 11/28/2016   Latex Rash 03/23/2015   Terbinafine Other (See Comments) and Rash 03/23/2015   Terbinafine hcl Rash 03/23/2015     Review of Systems:    All systems reviewed and negative except where noted in HPI with exception of activity intolerance and DOE.     Physical Exam:  Vital signs in last 24 hours: Temp:  [97.3 F (36.3 C)-98.5 F (36.9 C)] 97.3 F (36.3 C) (07/06 0719) Pulse Rate:  [66-83] 69 (07/06 1208) Resp:  [17-20] 17 (07/06 1208) BP: (120-156)/(44-59) 120/44 (07/06 1208) SpO2:  [95 %-100 %] 98 % (07/06 1208) Weight:  [73.5 kg] 73.5 kg (07/06 0459) Last BM Date : 05/22/22 General:   Pleasant elderly woman in NAD Head:  Normocephalic and atraumatic. Eyes:   No icterus.   Conjunctiva pink. Ears:  Normal auditory acuity. Mouth: Mucosa pink moist, no lesions. Neck:  Supple; no masses felt Lungs:  Respirations even and unlabored. Lungs clear to auscultation bilaterally.   No wheezes, crackles, or rhonchi.  Heart:  S1S2, RRR, no MRG. No edema. Abdomen:   Flat, soft,  nondistended, nontender. Normal bowel sounds. No appreciable masses or hepatomegaly. No rebound signs or other peritoneal signs. Rectal:  Not performed.  Msk:  MAEW x4, No clubbing or cyanosis. Strength 5/5. Symmetrical without gross deformities. Neurologic:  Alert and  oriented x4;  Cranial nerves II-XII intact.  Skin:  Warm, dry, pink without significant lesions or rashes. Psych:  Alert and cooperative. Normal affect.  LAB RESULTS: Recent Labs    05/21/22 1833 05/22/22 0637 05/22/22 1435 05/23/22 0634  WBC 7.8 8.4  --  6.0  HGB 6.1* 9.7* 10.2* 9.9*  HCT 20.7* 30.7* 32.2* 31.4*  PLT PLATELET CLUMPS NOTED ON SMEAR, UNABLE TO ESTIMATE 81*  --  PLATELET CLUMPS NOTED ON SMEAR   BMET Recent Labs    05/21/22 1833 05/22/22 0637 05/23/22 0634  NA 130* 134* 134*  K 4.5 3.8 3.3*  CL 102 103 102  CO2 '22 23 25  '$ GLUCOSE 293* 137* 165*  BUN 36* 32* 28*  CREATININE 1.21* 1.15* 1.23*  CALCIUM 8.3* 8.0* 8.4*   LFT Recent Labs    05/21/22 1833  PROT 5.9*  ALBUMIN 3.6  AST 18  ALT 12  ALKPHOS 114  BILITOT 0.6   PT/INR Recent Labs    05/21/22 1834  LABPROT 13.6  INR 1.1    STUDIES: ECHOCARDIOGRAM COMPLETE  Result Date: 05/22/2022    ECHOCARDIOGRAM REPORT   Patient Name:   DARLINA MCCAUGHEY Tasso Date of Exam: 05/22/2022  Medical Rec #:  [id number]       Height:       64.0 in Accession #:    [id number]      Weight:       162.5 lb Date of Birth:  09-Oct-1926       BSA:          1.791 m Patient Age:    54 years        BP:           150/59 mmHg Patient Gender: F               HR:           66 bpm. Exam Location:  [facility] Procedure: 2D Echo, Cardiac Doppler and Color Doppler Indications:     CHF-Acute Diastolic O27.03  History:         Patient has prior history of Echocardiogram examinations, most                  recent 07/25/2021. CAD, Signs/Symptoms:Murmur; Risk                  Factors:Diabetes, Hypertension and Dyslipidemia. History of                  Covid-19 infection.  Sonographer:     Sherrie Sport Referring Phys:  [id number] JAN A MANSY Diagnosing Phys: Kate Sable MD  Sonographer Comments: Suboptimal apical window. IMPRESSIONS  1. Left ventricular ejection fraction, by estimation, is 55 to 60%. The left ventricle has normal function. The left ventricle has no regional wall motion abnormalities. There is mild left ventricular hypertrophy. Left ventricular diastolic parameters are consistent with Grade II diastolic dysfunction (pseudonormalization).  2. Right ventricular systolic function is normal. The right ventricular size is normal.  3. The mitral valve is degenerative. Mild mitral valve regurgitation. Mild to moderate mitral stenosis. The mean mitral valve gradient is 6.0 mmHg. Moderate to severe mitral annular calcification.  4. Mean aortic valve gradient 73mHg, peak gradient 650mg, Vmax 4 m/s, AVA 0.6cm2, DVI 0.19. The aortic valve is calcified. Aortic valve regurgitation is not visualized. Severe aortic valve stenosis. FINDINGS  Left Ventricle: Left ventricular ejection fraction, by estimation, is 55 to 60%. The left ventricle has normal function. The left ventricle has no regional wall motion abnormalities. The left ventricular internal cavity size was normal in size. There is  mild left ventricular hypertrophy. Left ventricular diastolic parameters are consistent with Grade II diastolic dysfunction (pseudonormalization). Right Ventricle: The right ventricular size is normal. No increase in right ventricular wall thickness. Right ventricular systolic function is normal. Left Atrium: Left atrial size was normal in size. Right Atrium: Right atrial size was normal in size. Pericardium: There is no evidence of pericardial effusion. Mitral Valve: The mitral valve is degenerative in appearance. There is mild calcification of the mitral valve leaflet(s). Moderate to severe mitral annular calcification. Mild mitral valve regurgitation. Mild to moderate mitral valve stenosis. MV peak gradient, 10.9  mmHg. The mean mitral valve gradient is 6.0 mmHg. Tricuspid Valve: The tricuspid valve is grossly normal. Tricuspid valve regurgitation is trivial. Aortic Valve: Mean aortic valve gradient 3336m, peak gradient 88m50m Vmax 4 m/s, AVA 0.6cm2, DVI 0.19. The aortic valve is calcified. Aortic valve regurgitation is not visualized. Severe aortic stenosis is present. Aortic valve mean gradient measures 33.3 mmHg. Aortic valve peak gradient measures 57.5 mmHg. Aortic valve area, by VTI measures 0.62 cm. Pulmonic Valve: The pulmonic valve was not well  visualized. Pulmonic valve regurgitation is not visualized. Aorta: The aortic root is normal in size and structure. Venous: The inferior vena cava was not well visualized. IAS/Shunts: No atrial level shunt detected by color flow Doppler.  LEFT VENTRICLE PLAX 2D LVIDd:         4.00 cm   Diastology LVIDs:         2.80 cm   LV e' medial:    3.37 cm/s LV PW:         1.40 cm   LV E/e' medial:  44.8 LV IVS:        1.40 cm   LV e' lateral:   8.59 cm/s LVOT diam:     2.00 cm   LV E/e' lateral: 17.6 LV SV:         56 LV SV Index:   31 LVOT Area:     3.14 cm  RIGHT VENTRICLE RV Basal diam:  2.40 cm RV S prime:     13.50 cm/s TAPSE (M-mode): 2.3 cm LEFT ATRIUM             Index        RIGHT ATRIUM           Index LA diam:        3.70 cm 2.07 cm/m   RA Area:     12.80 cm LA Vol (A2C):   50.1 ml 27.97 ml/m  RA Volume:   29.00 ml  16.19 ml/m LA Vol (A4C):   45.6 ml 25.46 ml/m LA Biplane Vol: 50.4 ml 28.14 ml/m  AORTIC VALVE AV Area (Vmax):    0.62 cm AV Area (Vmean):   0.61 cm AV Area (VTI):     0.62 cm AV Vmax:           379.00 cm/s AV Vmean:          272.667 cm/s AV VTI:            0.899 m AV Peak Grad:      57.5 mmHg AV Mean Grad:      33.3 mmHg LVOT Vmax:         75.10 cm/s LVOT Vmean:        52.900 cm/s LVOT VTI:          0.177 m LVOT/AV VTI ratio: 0.20  AORTA Ao Root diam: 2.13 cm MITRAL VALVE                TRICUSPID VALVE MV Area (PHT): 5.16 cm     TR Peak grad:   22.8  mmHg MV Area VTI:   1.47 cm     TR Vmax:        239.00 cm/s MV Peak grad:  10.9 mmHg MV Mean grad:  6.0 mmHg     SHUNTS MV Vmax:       1.65 m/s     Systemic VTI:  0.18 m MV Vmean:      113.0 cm/s   Systemic Diam: 2.00 cm MV Decel Time: 147 msec MV E velocity: 151.00 cm/s MV A velocity: 135.00 cm/s MV E/A ratio:  1.12 Kate Sable MD Electronically signed by Kate Sable MD Signature Date/Time: 05/22/2022/3:59:07 PM    Final    DG Chest Portable 1 View  Result Date: 05/21/2022 CLINICAL DATA:  Chest pain.  Worsening dyspnea for 2 weeks. EXAM: PORTABLE CHEST 1 VIEW COMPARISON:  Chest radiograph 03/13/2022 and earlier; CT chest 05/16/2022. FINDINGS: The heart size and mediastinal contours are within normal limits.  Aortic calcifications. Bibasilar interstitial opacities. Small patchy airspace opacity in the right costophrenic angle. No pleural effusion or pneumothorax. Anterior cervical spinal fusion. IMPRESSION: Bibasilar interstitial opacities and small patchy airspace opacity at the right costophrenic angle may reflect subsegmental atelectasis, aspiration related changes or possibly infection in the appropriate clinical context. Aortic Atherosclerosis (ICD10-I70.0). Electronically Signed   By: Ileana Roup M.D.   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 Given her tenuous cardiac status will hold elective invasive procedures for now- will treat empirically for ulcers/gastritis- reviewed how to take PPI for max benefit. She is already avoiding nsaids. She should keep her follow ups about the TAVR as improving her AS will likely reduce the risk of developing further AVMs that can bleed spontaneously. Continue to follow hemoglobins, transfuse prn.   Thank you very much for this consult. These services were provided by Stephens November, NP-C, in  collaboration with Lesly Rubenstein, MD, with whom I have discussed this patient in full.   Stephens November, NP-C

## 2022-05-23 NOTE — Consult Note (Addendum)
   Heart Failure Nurse Navigator Note  HFpEF EF 55 to 60%.  Grade 2 diastolic dysfunction.  Mild LVH.  Severe aortic stenosis.  Mild mitral regurgitation.  Mild to moderate mitral stenosis.  She presented to the emergency room with complaints of worsening shortness of breath x2 weeks, chest discomfort and bilateral lower extremity edema.  BNP 303.  Hemoglobin noted to be 6.1 down from 8.19-monthago and the month prior to that was 10.6.  Comorbidities:  Coronary artery disease Diabetes Hypertension Hyperlipidemia Left bundle branch block Severe aortic stenosis Mild to moderate mitral stenosis  Medications:  Furosemide 20 mg IV daily Lisinopril 20 mg daily  Labs:  BNP 134, potassium 3.3, chloride 102, CO2 25, BUN 28, creatinine 1.32, estimated GFR 41, hemoglobin 9.9, hematocrit 31.7 Weight is 73.5 kg Intake 225 mL Output not documented   Initial meeting with patient, who was lying in bed in no acute distress.  Son and good friend who is a retired pLexicographeris at the bedside.  She states for the last 2 weeks she had noted worsening shortness of breath and chest discomfort along with lower extremity edema.  She states that she does not weigh herself on a daily basis but just keeps track of how her ankles and lower legs look.  Explained the reasoning behind daily weights and reporting 3 pound weight gain overnight or 5 pounds within the week.  Also discussed the importance to do report weight loss if you are eating and not able to maintain a weight.  She states that she knows at home that she is lost weight due to not having the appetite that she had prior.  She states a few months ago she was told by the nephrologist to limit her fluid intake to 40 ounces which she is able to stick with.  She also does not use salt at the table and misses eating crackers and etc. But was asked to abstain so she has done that  She states at home that she continues to ambulate around the home to  remain active.  She cannot wait until the TAVR is done so she can resume dancing.  Discussed follow-up in the outpatient heart failure clinic for which she has an appointment on July 24 at 2:30 in the afternoon.  She has a 0% no-show which is 0 out of 109 appointments.  She was given the living with heart failure teaching booklet, zone magnet, info on low-sodium and heart failure along with a weight chart.  JPricilla RiffleRN CHFN

## 2022-05-23 NOTE — Progress Notes (Signed)
Progress Note  Patient Name: Jasmine Webster Date of Encounter: 05/23/2022  [facility] Cardiologist: Rockey Situ  Subjective   Recent events discussed with her.  Reports having abnormal stools through much of the week though did not look at them to see if they were discolored, they just felt smaller, more frequent.  Developed chest pain July 4, presenting to the emergency room elevated troponin, hemoglobin of 6. Had transfusion x2, improvement of her chest pain symptoms was placed on heparin infusion Scheduled to complete heparin 48 hours by this evening 10 PM Reports doing better yesterday though had black stool, also noted by family No guaiac obtained.  By report she told the nurses  Inpatient Medications    Scheduled Meds:  furosemide  20 mg Intravenous Daily   insulin aspart  0-9 Units Subcutaneous TID AC & HS   lisinopril  20 mg Oral Daily   potassium chloride  40 mEq Oral Once   Continuous Infusions:  heparin 850 Units/hr (05/23/22 0517)   PRN Meds: acetaminophen **OR** acetaminophen, ALPRAZolam, magnesium hydroxide, ondansetron **OR** ondansetron (ZOFRAN) IV, traZODone   Vital Signs    Vitals:   05/22/22 2318 05/23/22 0435 05/23/22 0459 05/23/22 0719  BP: (!) 141/49 (!) 150/59  (!) 135/44  Pulse: 71 81  75  Resp: '18 18  18  '$ Temp: 98.4 F (36.9 C) 98.5 F (36.9 C)  (!) 97.3 F (36.3 C)  TempSrc: Oral Oral    SpO2: 98% 97%  95%  Weight:   73.5 kg   Height:        Intake/Output Summary (Last 24 hours) at 05/23/2022 1049 Last data filed at 05/23/2022 0517 Gross per 24 hour  Intake 225.15 ml  Output --  Net 225.15 ml      05/23/2022    4:59 AM 05/21/2022    6:30 PM 05/10/2022    1:24 PM  Last 3 Weights  Weight (lbs) 162 lb 0.6 oz 162 lb 7.7 oz 152 lb  Weight (kg) 73.5 kg 73.7 kg 68.947 kg      Telemetry     nsr - Personally Reviewed  ECG     - Personally Reviewed  Physical Exam   GEN: No acute distress.   Neck: No JVD Cardiac: RRR, no murmurs,  rubs, or gallops.  Respiratory: Clear to auscultation bilaterally. GI: Soft, nontender, non-distended  MS: No edema; No deformity. Neuro:  Nonfocal  Psych: Normal affect   Labs    High Sensitivity Troponin:   Recent Labs  Lab 05/21/22 1833 05/21/22 2033 05/22/22 0637 05/22/22 1435 05/22/22 1915  TROPONINIHS 116* 523* 4,218* 3,556* 2,929*     Chemistry Recent Labs  Lab 05/21/22 1833 05/22/22 0637 05/23/22 0634  NA 130* 134* 134*  K 4.5 3.8 3.3*  CL 102 103 102  CO2 '22 23 25  '$ GLUCOSE 293* 137* 165*  BUN 36* 32* 28*  CREATININE 1.21* 1.15* 1.23*  CALCIUM 8.3* 8.0* 8.4*  PROT 5.9*  --   --   ALBUMIN 3.6  --   --   AST 18  --   --   ALT 12  --   --   ALKPHOS 114  --   --   BILITOT 0.6  --   --   GFRNONAA 42* 44* 41*  ANIONGAP '6 8 7    '$ Lipids No results for input(s): "CHOL", "TRIG", "HDL", "LABVLDL", "LDLCALC", "CHOLHDL" in the last 168 hours.  Hematology Recent Labs  Lab 05/21/22 1833 05/21/22 2123 05/22/22 2119 05/22/22  1435 05/23/22 0634  WBC 7.8  --  8.4  --  6.0  RBC 2.40* 2.65* 3.63*  --  3.74*  HGB 6.1*  --  9.7* 10.2* 9.9*  HCT 20.7*  --  30.7* 32.2* 31.4*  MCV 86.3  --  84.6  --  84.0  MCH 25.4*  --  26.7  --  26.5  MCHC 29.5*  --  31.6  --  31.5  RDW 15.2  --  14.8  --  15.1  PLT PLATELET CLUMPS NOTED ON SMEAR, UNABLE TO ESTIMATE  --  81*  --  PLATELET CLUMPS NOTED ON SMEAR   Thyroid No results for input(s): "TSH", "FREET4" in the last 168 hours.  BNP Recent Labs  Lab 05/21/22 1833  BNP 300.3*    DDimer No results for input(s): "DDIMER" in the last 168 hours.   Radiology    ECHOCARDIOGRAM COMPLETE  Result Date: 05/22/2022    ECHOCARDIOGRAM REPORT   Patient Name:   Jasmine Webster Date of Exam: 05/22/2022 Medical Rec #:  [id number]       Height:       64.0 in Accession #:    [id number]      Weight:       162.5 lb Date of Birth:  [birth year]       BSA:          1.791 m Patient Age:    88 years        BP:           150/59 mmHg Patient Gender: F                HR:           66 bpm. Exam Location:  [facility] Procedure: 2D Echo, Cardiac Doppler and Color Doppler Indications:     CHF-Acute Diastolic H67.59  History:         Patient has prior history of Echocardiogram examinations, most                  recent 07/25/2021. CAD, Signs/Symptoms:Murmur; Risk                  Factors:Diabetes, Hypertension and Dyslipidemia. History of                  Covid-19 infection.  Sonographer:     Sherrie Sport Referring Phys:  [id number] JAN A MANSY Diagnosing Phys: Kate Sable MD  Sonographer Comments: Suboptimal apical window. IMPRESSIONS  1. Left ventricular ejection fraction, by estimation, is 55 to 60%. The left ventricle has normal function. The left ventricle has no regional wall motion abnormalities. There is mild left ventricular hypertrophy. Left ventricular diastolic parameters are consistent with Grade II diastolic dysfunction (pseudonormalization).  2. Right ventricular systolic function is normal. The right ventricular size is normal.  3. The mitral valve is degenerative. Mild mitral valve regurgitation. Mild to moderate mitral stenosis. The mean mitral valve gradient is 6.0 mmHg. Moderate to severe mitral annular calcification.  4. Mean aortic valve gradient 61mHg, peak gradient 694mg, Vmax 4 m/s, AVA 0.6cm2, DVI 0.19. The aortic valve is calcified. Aortic valve regurgitation is not visualized. Severe aortic valve stenosis. FINDINGS  Left Ventricle: Left ventricular ejection fraction, by estimation, is 55 to 60%. The left ventricle has normal function. The left ventricle has no regional wall motion abnormalities. The left ventricular internal cavity size was normal in size. There is  mild left ventricular hypertrophy. Left ventricular diastolic parameters are consistent with  Grade II diastolic dysfunction (pseudonormalization). Right Ventricle: The right ventricular size is normal. No increase in right ventricular wall thickness. Right ventricular systolic function is  normal. Left Atrium: Left atrial size was normal in size. Right Atrium: Right atrial size was normal in size. Pericardium: There is no evidence of pericardial effusion. Mitral Valve: The mitral valve is degenerative in appearance. There is mild calcification of the mitral valve leaflet(s). Moderate to severe mitral annular calcification. Mild mitral valve regurgitation. Mild to moderate mitral valve stenosis. MV peak gradient, 10.9 mmHg. The mean mitral valve gradient is 6.0 mmHg. Tricuspid Valve: The tricuspid valve is grossly normal. Tricuspid valve regurgitation is trivial. Aortic Valve: Mean aortic valve gradient 58mHg, peak gradient 660mg, Vmax 4 m/s, AVA 0.6cm2, DVI 0.19. The aortic valve is calcified. Aortic valve regurgitation is not visualized. Severe aortic stenosis is present. Aortic valve mean gradient measures 33.3 mmHg. Aortic valve peak gradient measures 57.5 mmHg. Aortic valve area, by VTI measures 0.62 cm. Pulmonic Valve: The pulmonic valve was not well visualized. Pulmonic valve regurgitation is not visualized. Aorta: The aortic root is normal in size and structure. Venous: The inferior vena cava was not well visualized. IAS/Shunts: No atrial level shunt detected by color flow Doppler.  LEFT VENTRICLE PLAX 2D LVIDd:         4.00 cm   Diastology LVIDs:         2.80 cm   LV e' medial:    3.37 cm/s LV PW:         1.40 cm   LV E/e' medial:  44.8 LV IVS:        1.40 cm   LV e' lateral:   8.59 cm/s LVOT diam:     2.00 cm   LV E/e' lateral: 17.6 LV SV:         56 LV SV Index:   31 LVOT Area:     3.14 cm  RIGHT VENTRICLE RV Basal diam:  2.40 cm RV S prime:     13.50 cm/s TAPSE (M-mode): 2.3 cm LEFT ATRIUM             Index        RIGHT ATRIUM           Index LA diam:        3.70 cm 2.07 cm/m   RA Area:     12.80 cm LA Vol (A2C):   50.1 ml 27.97 ml/m  RA Volume:   29.00 ml  16.19 ml/m LA Vol (A4C):   45.6 ml 25.46 ml/m LA Biplane Vol: 50.4 ml 28.14 ml/m  AORTIC VALVE AV Area (Vmax):    0.62 cm AV  Area (Vmean):   0.61 cm AV Area (VTI):     0.62 cm AV Vmax:           379.00 cm/s AV Vmean:          272.667 cm/s AV VTI:            0.899 m AV Peak Grad:      57.5 mmHg AV Mean Grad:      33.3 mmHg LVOT Vmax:         75.10 cm/s LVOT Vmean:        52.900 cm/s LVOT VTI:          0.177 m LVOT/AV VTI ratio: 0.20  AORTA Ao Root diam: 2.13 cm MITRAL VALVE                TRICUSPID  VALVE MV Area (PHT): 5.16 cm     TR Peak grad:   22.8 mmHg MV Area VTI:   1.47 cm     TR Vmax:        239.00 cm/s MV Peak grad:  10.9 mmHg MV Mean grad:  6.0 mmHg     SHUNTS MV Vmax:       1.65 m/s     Systemic VTI:  0.18 m MV Vmean:      113.0 cm/s   Systemic Diam: 2.00 cm MV Decel Time: 147 msec MV E velocity: 151.00 cm/s MV A velocity: 135.00 cm/s MV E/A ratio:  1.12 Kate Sable MD Electronically signed by Kate Sable MD Signature Date/Time: 05/22/2022/3:59:07 PM    Final    DG Chest Portable 1 View  Result Date: 05/21/2022 CLINICAL DATA:  Chest pain.  Worsening dyspnea for 2 weeks. EXAM: PORTABLE CHEST 1 VIEW COMPARISON:  Chest radiograph 03/13/2022 and earlier; CT chest 05/16/2022. FINDINGS: The heart size and mediastinal contours are within normal limits. Aortic calcifications. Bibasilar interstitial opacities. Small patchy airspace opacity in the right costophrenic angle. No pleural effusion or pneumothorax. Anterior cervical spinal fusion. IMPRESSION: Bibasilar interstitial opacities and small patchy airspace opacity at the right costophrenic angle may reflect subsegmental atelectasis, aspiration related changes or possibly infection in the appropriate clinical context. Aortic Atherosclerosis (ICD10-I70.0). Electronically Signed   By: Ileana Roup M.D.   On: 05/21/2022 18:57    Cardiac Studies   Echo 05/22/2022  1. Left ventricular ejection fraction, by estimation, is 55 to 60%. The  left ventricle has normal function. The left ventricle has no regional  wall motion abnormalities. There is mild left ventricular  hypertrophy.  Left ventricular diastolic parameters  are consistent with Grade II diastolic dysfunction (pseudonormalization).   2. Right ventricular systolic function is normal. The right ventricular  size is normal.   3. The mitral valve is degenerative. Mild mitral valve regurgitation.  Mild to moderate mitral stenosis. The mean mitral valve gradient is 6.0  mmHg. Moderate to severe mitral annular calcification.   4. Mean aortic valve gradient 19mHg, peak gradient 611mg, Vmax 4 m/s,  AVA 0.6cm2, DVI 0.19. The aortic valve is calcified. Aortic valve  regurgitation is not visualized. Severe aortic valve stenosis.   Patient Profile     Jasmine Webster a [age].o. female with a hx of severe AS, who is being seen 05/22/2022 for the evaluation of chest pain/non-STEMI, aortic valve stenosis, anemia  Assessment & Plan     NSTEMI Recent catheterization documenting nonobstructive coronary disease Troponin has peaked at 4000, now trending downward -Currently denies chest pain 48 hours of heparin infusion to be completed this evening -Presentation in the setting of anemia, initial hemoglobin 6.1 s/p packed red blood cells x2 with hemoglobin up to 10, stable -" Black stool last night" Echocardiogram with no new focal wall motion abnormality -LHC last month with no obstructive dz (60% Ramus, 30% LAD) --Would recommend medical management at this time given she is asymptomatic -Will need GI work-up   2.  Severe aortic stenosis -TAVR work-up being planned as outpatient Lasix as needed -Would likely require GI work-up as TAVR placement will require aspirin Plavix for several months post procedure   3.  Anemia s/p 2 units prbc Reported having black stools last night also some irregular bowel movements during the past week though did not look at them to see if they were discolored Would guaiac stools, consult GI -Transfuse to keep hemoglobin  greater than 8    Total encounter time more than  50 minutes  Greater than 50% was spent in counseling and coordination of care with the patient   For questions or updates, please contact [place] Please consult [email or web address] for contact info under        Signed, Ida Rogue, MD  05/23/2022, 10:49 AM

## 2022-05-23 NOTE — Progress Notes (Signed)
PROGRESS NOTE    Jasmine Webster  FXT:[id number] DOB: 10-27-1926  DOA: 05/21/2022 Date of Service: 05/23/22 PCP: Jerrol Banana., MD     Brief Narrative / Hospital Course:  Jasmine Webster is a 88 y.o. Caucasian female with medical history significant for CAD, T2DM, HTN, HLD, who presented to the ER 05/21/2022 with acute onset of worsening dyspnea over the last couple of weeks as well as midsternal chest pain x1 day,  07/04: Admitted for NSTEMI and CHF exacerbation, troponin was 116 --> 523 --(heparin gtt)--> trend. Hgb 6.1 --(transfusion> 2 units PRBC)--> 9.7, 10.2. Tx: IV lasix, po Lopressor. Note: Last echocardiogram 07/2021 EF 50 to 55%, left heart cath 04/2022 showed 60% ramus, 30% mid LAD. 07/05: Echo done, results pending. Troponin up to max 4218 --> trending to 3556. Cardiology consult: plan 48 hours heparin, d/c if significant drop HH. LHC last month nonobstructive.  07/06: Tropes trending down from peak 4000 yesterday morning to approx 3000 yesterday evening. H/H stable. Remains on heparin gtt this morning  until 22:00 tonight. I&O apparently not accurate, no output documented yesterday. Cocnern for dark stool, pending guaiac. Iron deficiency on labs but possible slow GI bleed. GI consult palced today  Consultants:  Cardiology Gastroenterology  Procedures: none    Subjective: Patient reports feeling fatigued but no CP/SOB at time of exam. No nausea, no vomiting. Feels improved compared to yesterday though notes trouble sleeping      ASSESSMENT & PLAN:   Principal Problem:   Acute CHF (congestive heart failure) ([place]) Active Problems:   NSTEMI (non-ST elevated myocardial infarction) (HCC)   Symptomatic anemia   Moderate aortic valve stenosis   Benign essential HTN   GERD without esophagitis   Type 2 diabetes mellitus without complications ([facility])   Dyslipidemia   Acute CHF (congestive heart failure) ([facility]) - admitted to the PCU. - IV Lasix --> po per  cardiology  - 2D echo --> EF 55-60%, mild LVH, Grade II diastolic df, severe AS -- cardiology consult, no intervention planned at this time  NSTEMI (non-ST elevated myocardial infarction) ([place]) - IV heparin per protocol, 48 hours duration - will follow serial troponins --> trending down  - The patient will be placed on aspirin as well as sublingual nitroglycerin and morphine sulfate for pain.   -- cardiology following   Benign essential HTN - continue antihypertensives.  GERD without esophagitis - continue PPI therapy.  Type 2 diabetes mellitus without complications (HCC) - NovoLog. - continue her basal coverage. - continue Januvia and Amaryl.  Dyslipidemia - continue statin therapy.  Symptomatic anemia - This is acute on chronic anemia. - stable s/p 2 units PRBC -- GI consulted  -- guaiac stool pending   Moderate aortic valve stenosis - This is likely the main contributor for her acute CHF and possibly for ischemia and non-STEMI. - Cardiology consult will be obtained and 2D echo as above - She was expected to have evaluation for TAVR in August.    DVT prophylaxis: heparin gtt Code Status: DNR Family Communication: family is at bedside on rounds.  Disposition Plan / TOC needs: anticipate back to previous home environment  Barriers to discharge / significant pending items: cardiology consult and recs, IV medications treating active disease exacerbation w/ NSTEMI, CHF. Working up anemia. Remains inpatient appropriate              Objective: Vitals:   05/22/22 2318 05/23/22 0435 05/23/22 0459 05/23/22 0719  BP: (!) 141/49 (!) 150/59  Marland Kitchen)  135/44  Pulse: 71 81  75  Resp: '18 18  18  '$ Temp: 98.4 F (36.9 C) 98.5 F (36.9 C)  (!) 97.3 F (36.3 C)  TempSrc: Oral Oral    SpO2: 98% 97%  95%  Weight:   73.5 kg   Height:        Intake/Output Summary (Last 24 hours) at 05/23/2022 6378 Last data filed at 05/23/2022 5885 Gross per 24 hour  Intake 225.15 ml  Output  --  Net 225.15 ml    Filed Weights   05/21/22 1830 05/23/22 0459  Weight: 73.7 kg 73.5 kg    Examination:  Constitutional:  VS as above General Appearance: alert, well-developed, well-nourished, NAD Ears, Nose, Mouth, Throat: Normal appearance Neck: No masses, trachea midline Respiratory: Normal respiratory effort Breath sounds normal, no wheeze/rhonchi/rales Cardiovascular: S1/S2 normal, no murmur/rub/gallop auscultated Trace lower extremity edema Gastrointestinal: Nontender, no masses No hepatomegaly, no splenomegaly No hernia appreciated Musculoskeletal:  No clubbing/cyanosis of digits Neurological: No cranial nerve deficit on limited exam Psychiatric: Normal judgment/insight Normal mood and affect       Scheduled Medications:   furosemide  20 mg Intravenous Daily   insulin aspart  0-9 Units Subcutaneous TID AC & HS   lisinopril  20 mg Oral Daily    Continuous Infusions:  heparin 850 Units/hr (05/23/22 0517)    PRN Medications:  acetaminophen **OR** acetaminophen, ALPRAZolam, magnesium hydroxide, ondansetron **OR** ondansetron (ZOFRAN) IV, traZODone  Antimicrobials:  Anti-infectives (From admission, onward)    None       Data Reviewed: I have personally reviewed following labs and imaging studies  CBC: Recent Labs  Lab 05/21/22 1833 05/22/22 0637 05/22/22 1435  WBC 7.8 8.4  --   NEUTROABS 6.3  --   --   HGB 6.1* 9.7* 10.2*  HCT 20.7* 30.7* 32.2*  MCV 86.3 84.6  --   PLT PLATELET CLUMPS NOTED ON SMEAR, UNABLE TO ESTIMATE 81*  --     Basic Metabolic Panel: Recent Labs  Lab 05/21/22 1833 05/22/22 0637 05/23/22 0634  NA 130* 134* 134*  K 4.5 3.8 3.3*  CL 102 103 102  CO2 '22 23 25  '$ GLUCOSE 293* 137* 165*  BUN 36* 32* 28*  CREATININE 1.21* 1.15* 1.23*  CALCIUM 8.3* 8.0* 8.4*    GFR: Estimated Creatinine Clearance: 28.1 mL/min (A) (by C-G formula based on SCr of 1.23 mg/dL (H)). Liver Function Tests: Recent Labs  Lab  05/21/22 1833  AST 18  ALT 12  ALKPHOS 114  BILITOT 0.6  PROT 5.9*  ALBUMIN 3.6    No results for input(s): "LIPASE", "AMYLASE" in the last 168 hours. No results for input(s): "AMMONIA" in the last 168 hours. Coagulation Profile: Recent Labs  Lab 05/21/22 1834  INR 1.1    Cardiac Enzymes: No results for input(s): "CKTOTAL", "CKMB", "CKMBINDEX", "TROPONINI" in the last 168 hours. BNP (last 3 results) No results for input(s): "PROBNP" in the last 8760 hours. HbA1C: No results for input(s): "HGBA1C" in the last 72 hours. CBG: Recent Labs  Lab 05/21/22 2249 05/22/22 1135 05/22/22 2049 05/23/22 0716  GLUCAP 195* 169* 198* 205*    Lipid Profile: No results for input(s): "CHOL", "HDL", "LDLCALC", "TRIG", "CHOLHDL", "LDLDIRECT" in the last 72 hours. Thyroid Function Tests: No results for input(s): "TSH", "T4TOTAL", "FREET4", "T3FREE", "THYROIDAB" in the last 72 hours. Anemia Panel: Recent Labs    05/21/22 2123  VITAMINB12 391  FOLATE 28.0  FERRITIN 12  TIBC 462*  IRON 11*  RETICCTPCT 2.7  Urine analysis:    Component Value Date/Time   COLORURINE COLORLESS (A) 11/26/2021 2215   APPEARANCEUR CLEAR (A) 11/26/2021 2215   LABSPEC 1.003 (L) 11/26/2021 2215   PHURINE 5.0 11/26/2021 2215   GLUCOSEU NEGATIVE 11/26/2021 2215   HGBUR NEGATIVE 11/26/2021 2215   BILIRUBINUR NEGATIVE 11/26/2021 2215   BILIRUBINUR negative 05/26/2019 1558   KETONESUR NEGATIVE 11/26/2021 2215   PROTEINUR NEGATIVE 11/26/2021 2215   UROBILINOGEN 0.2 05/26/2019 1558   UROBILINOGEN 1.0 05/25/2009 0717   NITRITE NEGATIVE 11/26/2021 2215   LEUKOCYTESUR NEGATIVE 11/26/2021 2215   Sepsis Labs: '@LABRCNTIP'$ (procalcitonin:4,lacticidven:4)  No results found for this or any previous visit (from the past 240 hour(s)).       Radiology Studies last 96 hours: ECHOCARDIOGRAM COMPLETE  Result Date: 05/22/2022    ECHOCARDIOGRAM REPORT   Patient Name:   ALISHA BACUS Gilardi Date of Exam: 05/22/2022  Medical Rec #:  [id number]       Height:       64.0 in Accession #:    [id number]      Weight:       162.5 lb Date of Birth:  Oct 13, 1926       BSA:          1.791 m Patient Age:    86 years        BP:           150/59 mmHg Patient Gender: F               HR:           66 bpm. Exam Location:  [facility] Procedure: 2D Echo, Cardiac Doppler and Color Doppler Indications:     CHF-Acute Diastolic M54.65  History:         Patient has prior history of Echocardiogram examinations, most                  recent 07/25/2021. CAD, Signs/Symptoms:Murmur; Risk                  Factors:Diabetes, Hypertension and Dyslipidemia. History of                  Covid-19 infection.  Sonographer:     Sherrie Sport Referring Phys:  [id number] JAN A MANSY Diagnosing Phys: Kate Sable MD  Sonographer Comments: Suboptimal apical window. IMPRESSIONS  1. Left ventricular ejection fraction, by estimation, is 55 to 60%. The left ventricle has normal function. The left ventricle has no regional wall motion abnormalities. There is mild left ventricular hypertrophy. Left ventricular diastolic parameters are consistent with Grade II diastolic dysfunction (pseudonormalization).  2. Right ventricular systolic function is normal. The right ventricular size is normal.  3. The mitral valve is degenerative. Mild mitral valve regurgitation. Mild to moderate mitral stenosis. The mean mitral valve gradient is 6.0 mmHg. Moderate to severe mitral annular calcification.  4. Mean aortic valve gradient 59mHg, peak gradient 667mg, Vmax 4 m/s, AVA 0.6cm2, DVI 0.19. The aortic valve is calcified. Aortic valve regurgitation is not visualized. Severe aortic valve stenosis. FINDINGS  Left Ventricle: Left ventricular ejection fraction, by estimation, is 55 to 60%. The left ventricle has normal function. The left ventricle has no regional wall motion abnormalities. The left ventricular internal cavity size was normal in size. There is  mild left ventricular hypertrophy. Left  ventricular diastolic parameters are consistent with Grade II diastolic dysfunction (pseudonormalization). Right Ventricle: The right ventricular size is normal. No increase in right ventricular wall thickness. Right ventricular systolic function is  normal. Left Atrium: Left atrial size was normal in size. Right Atrium: Right atrial size was normal in size. Pericardium: There is no evidence of pericardial effusion. Mitral Valve: The mitral valve is degenerative in appearance. There is mild calcification of the mitral valve leaflet(s). Moderate to severe mitral annular calcification. Mild mitral valve regurgitation. Mild to moderate mitral valve stenosis. MV peak gradient, 10.9 mmHg. The mean mitral valve gradient is 6.0 mmHg. Tricuspid Valve: The tricuspid valve is grossly normal. Tricuspid valve regurgitation is trivial. Aortic Valve: Mean aortic valve gradient 29mHg, peak gradient 661mg, Vmax 4 m/s, AVA 0.6cm2, DVI 0.19. The aortic valve is calcified. Aortic valve regurgitation is not visualized. Severe aortic stenosis is present. Aortic valve mean gradient measures 33.3 mmHg. Aortic valve peak gradient measures 57.5 mmHg. Aortic valve area, by VTI measures 0.62 cm. Pulmonic Valve: The pulmonic valve was not well visualized. Pulmonic valve regurgitation is not visualized. Aorta: The aortic root is normal in size and structure. Venous: The inferior vena cava was not well visualized. IAS/Shunts: No atrial level shunt detected by color flow Doppler.  LEFT VENTRICLE PLAX 2D LVIDd:         4.00 cm   Diastology LVIDs:         2.80 cm   LV e' medial:    3.37 cm/s LV PW:         1.40 cm   LV E/e' medial:  44.8 LV IVS:        1.40 cm   LV e' lateral:   8.59 cm/s LVOT diam:     2.00 cm   LV E/e' lateral: 17.6 LV SV:         56 LV SV Index:   31 LVOT Area:     3.14 cm  RIGHT VENTRICLE RV Basal diam:  2.40 cm RV S prime:     13.50 cm/s TAPSE (M-mode): 2.3 cm LEFT ATRIUM             Index        RIGHT ATRIUM            Index LA diam:        3.70 cm 2.07 cm/m   RA Area:     12.80 cm LA Vol (A2C):   50.1 ml 27.97 ml/m  RA Volume:   29.00 ml  16.19 ml/m LA Vol (A4C):   45.6 ml 25.46 ml/m LA Biplane Vol: 50.4 ml 28.14 ml/m  AORTIC VALVE AV Area (Vmax):    0.62 cm AV Area (Vmean):   0.61 cm AV Area (VTI):     0.62 cm AV Vmax:           379.00 cm/s AV Vmean:          272.667 cm/s AV VTI:            0.899 m AV Peak Grad:      57.5 mmHg AV Mean Grad:      33.3 mmHg LVOT Vmax:         75.10 cm/s LVOT Vmean:        52.900 cm/s LVOT VTI:          0.177 m LVOT/AV VTI ratio: 0.20  AORTA Ao Root diam: 2.13 cm MITRAL VALVE                TRICUSPID VALVE MV Area (PHT): 5.16 cm     TR Peak grad:   22.8 mmHg MV Area VTI:   1.47 cm  TR Vmax:        239.00 cm/s MV Peak grad:  10.9 mmHg MV Mean grad:  6.0 mmHg     SHUNTS MV Vmax:       1.65 m/s     Systemic VTI:  0.18 m MV Vmean:      113.0 cm/s   Systemic Diam: 2.00 cm MV Decel Time: 147 msec MV E velocity: 151.00 cm/s MV A velocity: 135.00 cm/s MV E/A ratio:  1.12 Kate Sable MD Electronically signed by Kate Sable MD Signature Date/Time: 05/22/2022/3:59:07 PM    Final    DG Chest Portable 1 View  Result Date: 05/21/2022 CLINICAL DATA:  Chest pain.  Worsening dyspnea for 2 weeks. EXAM: PORTABLE CHEST 1 VIEW COMPARISON:  Chest radiograph 03/13/2022 and earlier; CT chest 05/16/2022. FINDINGS: The heart size and mediastinal contours are within normal limits. Aortic calcifications. Bibasilar interstitial opacities. Small patchy airspace opacity in the right costophrenic angle. No pleural effusion or pneumothorax. Anterior cervical spinal fusion. IMPRESSION: Bibasilar interstitial opacities and small patchy airspace opacity at the right costophrenic angle may reflect subsegmental atelectasis, aspiration related changes or possibly infection in the appropriate clinical context. Aortic Atherosclerosis (ICD10-I70.0). Electronically Signed   By: Ileana Roup M.D.   On: 05/21/2022  18:57            LOS: 2 days     Emeterio Reeve, DO [facility] 05/23/2022, 9:07 AM   Staff may message me via secure chat in [place]  but this may not receive immediate response,  please page for urgent matters!  If 7PM-7AM, please contact night-coverage [email or web address]  Dictation software was used to generate the above note. Typos may occur and escape review, as with typed/written notes. Please contact Dr Sheppard Coil directly for clarity if needed.

## 2022-05-24 DIAGNOSIS — I350 Nonrheumatic aortic (valve) stenosis: Secondary | ICD-10-CM | POA: Diagnosis not present

## 2022-05-24 DIAGNOSIS — I5031 Acute diastolic (congestive) heart failure: Secondary | ICD-10-CM | POA: Diagnosis not present

## 2022-05-24 LAB — BASIC METABOLIC PANEL
Anion gap: 6 (ref 5–15)
BUN: 37 mg/dL — ABNORMAL HIGH (ref 8–23)
CO2: 24 mmol/L (ref 22–32)
Calcium: 8.3 mg/dL — ABNORMAL LOW (ref 8.9–10.3)
Chloride: 105 mmol/L (ref 98–111)
Creatinine, Ser: 1.32 mg/dL — ABNORMAL HIGH (ref 0.44–1.00)
GFR, Estimated: 38 mL/min — ABNORMAL LOW (ref >60)
Glucose, Bld: 138 mg/dL — ABNORMAL HIGH (ref 70–99)
Potassium: 4.2 mmol/L (ref 3.5–5.1)
Sodium: 135 mmol/L (ref 135–145)

## 2022-05-24 LAB — GLUCOSE, CAPILLARY
Glucose-Capillary: 157 mg/dL — ABNORMAL HIGH (ref 70–99)
Glucose-Capillary: 312 mg/dL — ABNORMAL HIGH (ref 70–99)
Glucose-Capillary: 229 mg/dL — ABNORMAL HIGH (ref 70–99)

## 2022-05-24 LAB — CBC
HCT: 28.9 % — ABNORMAL LOW (ref 36.0–46.0)
Hemoglobin: 9 g/dL — ABNORMAL LOW (ref 12.0–15.0)
MCH: 26.7 pg (ref 26.0–34.0)
MCHC: 31.1 g/dL (ref 30.0–36.0)
MCV: 85.8 fL (ref 80.0–100.0)
Platelets: UNDETERMINED 10*3/uL (ref 150–400)
RBC: 3.37 MIL/uL — ABNORMAL LOW (ref 3.87–5.11)
RDW: 15.4 % (ref 11.5–15.5)
WBC: 6 10*3/uL (ref 4.0–10.5)
nRBC: 0 % (ref 0.0–0.2)

## 2022-05-24 MED ORDER — FUROSEMIDE 20 MG PO TABS: 20.0000 mg | ORAL_TABLET | ORAL | Status: DC

## 2022-05-24 MED ORDER — LISINOPRIL 10 MG PO TABS: 10.0000 mg | ORAL_TABLET | Freq: Every day | ORAL | 0 refills | Status: DC

## 2022-05-24 MED ORDER — OMEPRAZOLE 40 MG PO CPDR: 40.0000 mg | DELAYED_RELEASE_CAPSULE | Freq: Every day | ORAL | 0 refills | Status: DC

## 2022-05-24 MED ORDER — FUROSEMIDE 20 MG PO TABS: 20.0000 mg | ORAL_TABLET | ORAL | 0 refills | Status: DC

## 2022-05-24 MED ORDER — EZETIMIBE 10 MG PO TABS: 10.0000 mg | ORAL_TABLET | Freq: Every day | ORAL | 0 refills | Status: DC

## 2022-05-24 MED ORDER — EZETIMIBE 10 MG PO TABS
10.0000 mg | ORAL_TABLET | Freq: Every day | ORAL | Status: DC
  Administered 2022-05-24: 10 mg via ORAL
  Filled 2022-05-24: qty 1

## 2022-05-24 MED ORDER — ENOXAPARIN SODIUM 30 MG/0.3ML IJ SOSY: 30.0000 mg | PREFILLED_SYRINGE | INTRAMUSCULAR | Status: DC

## 2022-05-24 NOTE — Discharge Summary (Signed)
Physician Discharge Summary   Patient: Jasmine Webster MRN: [id number]  DOB: [birth year]   Admit:     Date of Admission: 05/21/2022 Admitted from: home   Discharge: Date of discharge: 05/24/22 Disposition: Home Condition at discharge: good  CODE STATUS: DNR    Discharge Physician: Emeterio Reeve, DO [facility]     PCP: Jerrol Banana., MD  Recommendations for Outpatient Follow-up:  Follow up with PCP Jerrol Banana., MD in 1-2 weeks Please obtain labs/tests: CBC, BMP Please follow up on the following pending results: none Please also ensure follow-up with cardiology as directed.   Discharge Instructions     Diet - low sodium heart healthy   Complete by: As directed    Increase activity slowly   Complete by: As directed         Hospital Course:  Jasmine Webster is a 88 y.o. Caucasian female with medical history significant for CAD, T2DM, HTN, HLD, who presented to the ER 05/21/2022 with acute onset of worsening dyspnea over the last couple of weeks as well as midsternal chest pain x1 day,  07/04: Admitted for NSTEMI and CHF exacerbation, troponin was 116 --> 523 --(heparin gtt)--> trend. Hgb 6.1 --(transfusion> 2 units PRBC)--> 9.7, 10.2. Tx: IV lasix, po Lopressor. Note: Last echocardiogram 07/2021 EF 50 to 55%, left heart cath 04/2022 showed 60% ramus, 30% mid LAD. 07/05: Echo done, results pending. Troponin up to max 4218 --> trending to 3556. Cardiology consult: plan 48 hours heparin, d/c if significant drop HH. LHC last month nonobstructive.  07/06: Tropes trending down from peak 4000 yesterday morning to approx 3000 yesterday evening. H/H stable. Remains on heparin gtt this morning  until 22:00 tonight. I&O apparently not accurate, no output documented yesterday. Cocnern for dark stool, pending guaiac. Iron deficiency on labs but possible slow GI bleed. GI consult  07/07: Off heparin drip since night prior, no CP/SOB, ambulated in the  hallway.  Cardiology cleared for discharge, diagnosis demand ischemia in the setting of severe anemia and underlying severe aortic stenosis.  Plan to follow-up outpatient evaluation for TAVR and cardiology team will try to expedite this.   Consultants:  Cardiology Gastroenterology   Procedures: none       Subjective: Patient reports feeling good this morning, no CP/SOB, no headache, dizziness.  There was some concern about blood pressure being low but I manually checked it myself and was normal   Discharge Diagnoses: Principal Problem:   Acute CHF (congestive heart failure) ([place]) Active Problems:   NSTEMI (non-ST elevated myocardial infarction) (HCC)   Symptomatic anemia   Moderate aortic valve stenosis   Benign essential HTN   GERD without esophagitis   Type 2 diabetes mellitus without complications ([place])   Dyslipidemia    Assessment & Plan:  Acute CHF (congestive heart failure) (HCC) in setting of severe aortic stenosis, anemia. - admitted to the PCU. - IV Lasix --> po per cardiology  - 2D echo --> EF 55-60%, mild LVH, Grade II diastolic df, severe AS -- cardiology consult, no intervention planned at this time, continue plan for outpatient follow-up for TAVR   NSTEMI (non-ST elevated myocardial infarction) ([place]) in setting of severe aortic stenosis, anemia, demand ischemia due to these conditions. - IV heparin per protocol, 48 hours duration - serial troponins --> trending down    Benign essential HTN - continue antihypertensives.   GERD without esophagitis - continue PPI therapy.   Type 2 diabetes mellitus without complications (  [place]) - continue her basal coverage. - continue Januvia and Amaryl.   Dyslipidemia -Intolerance to statins, cardiology started [place].   Symptomatic anemia - This is acute on chronic anemia. - stable s/p 2 units PRBC -- GI consulted no intervention   Moderate aortic valve stenosis - This is likely the main contributor for her acute  CHF and ischemia and non-STEMI.[place]       Discharge Instructions  Allergies as of 05/24/2022       Reactions   Etodolac Nausea Only, Other (See Comments), Anxiety   Dizziness, too   Naproxen Other (See Comments), Anxiety   Gave her an ulcer after taking it for years   Statins Other (See Comments)   No energy and myalgias   Latex Rash   Terbinafine Other (See Comments), Rash   "Maybe made me nervous"   Terbinafine Hcl Rash        Medication List     TAKE these medications    ALPRAZolam 0.5 MG tablet Commonly known as: XANAX TAKE 1 TABLET BY MOUTH EVERY DAY AT BEDTIME AS NEEDED   aspirin EC 81 MG tablet Take 81 mg by mouth daily.   co-enzyme Q-10 30 MG capsule Take 30 mg by mouth daily.   DRY EYES OP Apply 1 drop to eye daily.   ezetimibe 10 MG tablet Commonly known as: ZETIA Take 1 tablet (10 mg total) by mouth daily.   FISH OIL PO Take by mouth daily.   furosemide 20 MG tablet Commonly known as: LASIX Take 1 tablet (20 mg total) by mouth every other day. Start taking on: May 26, 2022 What changed: See the new instructions.   Lantus SoloStar 100 UNIT/ML Solostar Pen Generic drug: insulin glargine INJECT 25 UNITS SUBCUTANEOUSLY What changed: See the new instructions.   lisinopril 10 MG tablet Commonly known as: ZESTRIL Take 1 tablet (10 mg total) by mouth daily. What changed:  medication strength how much to take   Magnesium 400 MG Caps Take 400 mg by mouth daily.   nitroGLYCERIN 0.4 MG SL tablet Commonly known as: NITROSTAT Place 1 tablet (0.4 mg total) under the tongue every 5 (five) minutes as needed for chest pain.   omeprazole 40 MG capsule Commonly known as: PRILOSEC Take 1 capsule (40 mg total) by mouth daily. What changed:  medication strength how much to take   ondansetron 4 MG tablet Commonly known as: ZOFRAN TAKE 1 TABLET BY MOUTH EVERY 8 HOURS AS NEEDED FOR NAUSEA AND VOMITING   pantoprazole 40 MG tablet Commonly known as:  PROTONIX Take 1 tablet (40 mg total) by mouth 2 (two) times daily before a meal.   potassium chloride 10 MEQ tablet Commonly known as: KLOR-CON TAKE 1 TABLET BY MOUTH 2-3 TIMES A WEEK WITH FUROSEMIDE AS DIRECTED   PreserVision AREDS Caps Take 1 capsule by mouth 2 (two) times daily.   Turmeric 500 MG Caps Take by mouth daily.   vitamin B-12 500 MCG tablet Commonly known as: CYANOCOBALAMIN Take 500 mcg by mouth once a week.          Allergies  Allergen Reactions   Etodolac Nausea Only, Other (See Comments) and Anxiety    Dizziness, too   Naproxen Other (See Comments) and Anxiety    Gave her an ulcer after taking it for years   Statins Other (See Comments)    No energy and myalgias   Latex Rash   Terbinafine Other (See Comments) and Rash    "Maybe made me  nervous"   Terbinafine Hcl Rash      Discharge Exam: Vitals:   05/24/22 0724 05/24/22 1114  BP: (!) 123/42 (!) 113/37  Pulse: 64 72  Resp: 16 16  Temp: (!) 97.5 F (36.4 C) 97.8 F (36.6 C)  SpO2: 100% 99%   Vitals:   05/24/22 0358 05/24/22 0424 05/24/22 0724 05/24/22 1114  BP: (!) 107/42  (!) 123/42 (!) 113/37  Pulse: 65  64 72  Resp: '18  16 16  '$ Temp: 97.7 F (36.5 C)  (!) 97.5 F (36.4 C) 97.8 F (36.6 C)  TempSrc: Oral  Oral   SpO2: 95%  100% 99%  Weight:  67.9 kg    Height:         General: Pt is alert, awake, not in acute distress Cardiovascular: RRR, S1/S2 +, no rubs, no gallops, 3-6 systolic murmur Respiratory: CTA bilaterally, no wheezing, no rhonchi Abdominal: Soft, NT, ND, bowel sounds + Extremities: no edema, no cyanosis     The results of significant diagnostics from this hospitalization (including imaging, microbiology, ancillary and laboratory) are listed below for reference.     Microbiology: No results found for this or any previous visit (from the past 240 hour(s)).   Labs: BNP (last 3 results) Recent Labs    11/26/21 1053 05/21/22 1833  BNP 133.2* 300.3*   Basic  Metabolic Panel: Recent Labs  Lab 05/21/22 1833 05/22/22 0637 05/23/22 0634 05/24/22 0520  NA 130* 134* 134* 135  K 4.5 3.8 3.3* 4.2  CL 102 103 102 105  CO2 '22 23 25 24  '$ GLUCOSE 293* 137* 165* 138*  BUN 36* 32* 28* 37*  CREATININE 1.21* 1.15* 1.23* 1.32*  CALCIUM 8.3* 8.0* 8.4* 8.3*   Liver Function Tests: Recent Labs  Lab 05/21/22 1833  AST 18  ALT 12  ALKPHOS 114  BILITOT 0.6  PROT 5.9*  ALBUMIN 3.6   No results for input(s): "LIPASE", "AMYLASE" in the last 168 hours. No results for input(s): "AMMONIA" in the last 168 hours. CBC: Recent Labs  Lab 05/21/22 1833 05/22/22 0637 05/22/22 1435 05/23/22 0634 05/24/22 0520  WBC 7.8 8.4  --  6.0 6.0  NEUTROABS 6.3  --   --   --   --   HGB 6.1* 9.7* 10.2* 9.9* 9.0*  HCT 20.7* 30.7* 32.2* 31.4* 28.9*  MCV 86.3 84.6  --  84.0 85.8  PLT PLATELET CLUMPS NOTED ON SMEAR, UNABLE TO ESTIMATE 81*  --  PLATELET CLUMPS NOTED ON SMEAR PLATELET CLUMPS NOTED ON SMEAR, UNABLE TO ESTIMATE   Cardiac Enzymes: No results for input(s): "CKTOTAL", "CKMB", "CKMBINDEX", "TROPONINI" in the last 168 hours. BNP: Invalid input(s): "POCBNP" CBG: Recent Labs  Lab 05/23/22 1655 05/23/22 2049 05/24/22 0722 05/24/22 1109 05/24/22 1258  GLUCAP 159* 176* 157* 312* 229*   D-Dimer No results for input(s): "DDIMER" in the last 72 hours. Hgb A1c No results for input(s): "HGBA1C" in the last 72 hours. Lipid Profile No results for input(s): "CHOL", "HDL", "LDLCALC", "TRIG", "CHOLHDL", "LDLDIRECT" in the last 72 hours. Thyroid function studies No results for input(s): "TSH", "T4TOTAL", "T3FREE", "THYROIDAB" in the last 72 hours.  Invalid input(s): "FREET3" Anemia work up Recent Labs    05/21/22 2123  VITAMINB12 391  FOLATE 28.0  FERRITIN 12  TIBC 462*  IRON 11*  RETICCTPCT 2.7   Urinalysis    Component Value Date/Time   COLORURINE COLORLESS (A) 11/26/2021 2215   APPEARANCEUR CLEAR (A) 11/26/2021 2215   LABSPEC 1.003 (L) 11/26/2021  2215  PHURINE 5.0 11/26/2021 2215   GLUCOSEU NEGATIVE 11/26/2021 2215   HGBUR NEGATIVE 11/26/2021 2215   BILIRUBINUR NEGATIVE 11/26/2021 2215   BILIRUBINUR negative 05/26/2019 1558   KETONESUR NEGATIVE 11/26/2021 2215   PROTEINUR NEGATIVE 11/26/2021 2215   UROBILINOGEN 0.2 05/26/2019 1558   UROBILINOGEN 1.0 05/25/2009 0717   NITRITE NEGATIVE 11/26/2021 2215   LEUKOCYTESUR NEGATIVE 11/26/2021 2215   Sepsis Labs Recent Labs  Lab 05/21/22 1833 05/22/22 0637 05/23/22 0634 05/24/22 0520  WBC 7.8 8.4 6.0 6.0   Microbiology No results found for this or any previous visit (from the past 240 hour(s)). Imaging ECHOCARDIOGRAM COMPLETE  Result Date: 05/22/2022    ECHOCARDIOGRAM REPORT   Patient Name:   Jasmine Webster [place] Date of Exam: 05/22/2022 Medical Rec #:  [id number]       Height:       64.0 in Accession #:    [id number]      Weight:       162.5 lb Date of Birth:  [birth year]       BSA:          1.791 m Patient Age:    88 years        BP:           150/59 mmHg Patient Gender: F               HR:           66 bpm. Exam Location:  [facility] Procedure: 2D Echo, Cardiac Doppler and Color Doppler Indications:     CHF-Acute Diastolic E94.07  History:         Patient has prior history of Echocardiogram examinations, most                  recent 07/25/2021. CAD, Signs/Symptoms:Murmur; Risk                  Factors:Diabetes, Hypertension and Dyslipidemia. History of                  Covid-19 infection.  Sonographer:     Sherrie Sport Referring Phys:  [id number] JAN A MANSY Diagnosing Phys: Kate Sable MD  Sonographer Comments: Suboptimal apical window. IMPRESSIONS  1. Left ventricular ejection fraction, by estimation, is 55 to 60%. The left ventricle has normal function. The left ventricle has no regional wall motion abnormalities. There is mild left ventricular hypertrophy. Left ventricular diastolic parameters are consistent with Grade II diastolic dysfunction (pseudonormalization).  2. Right ventricular systolic  function is normal. The right ventricular size is normal.  3. The mitral valve is degenerative. Mild mitral valve regurgitation. Mild to moderate mitral stenosis. The mean mitral valve gradient is 6.0 mmHg. Moderate to severe mitral annular calcification.  4. Mean aortic valve gradient 65mHg, peak gradient 665mg, Vmax 4 m/s, AVA 0.6cm2, DVI 0.19. The aortic valve is calcified. Aortic valve regurgitation is not visualized. Severe aortic valve stenosis. FINDINGS  Left Ventricle: Left ventricular ejection fraction, by estimation, is 55 to 60%. The left ventricle has normal function. The left ventricle has no regional wall motion abnormalities. The left ventricular internal cavity size was normal in size. There is  mild left ventricular hypertrophy. Left ventricular diastolic parameters are consistent with Grade II diastolic dysfunction (pseudonormalization). Right Ventricle: The right ventricular size is normal. No increase in right ventricular wall thickness. Right ventricular systolic function is normal. Left Atrium: Left atrial size was normal in size. Right Atrium: Right atrial size was normal in size. Pericardium: There is no  evidence of pericardial effusion. Mitral Valve: The mitral valve is degenerative in appearance. There is mild calcification of the mitral valve leaflet(s). Moderate to severe mitral annular calcification. Mild mitral valve regurgitation. Mild to moderate mitral valve stenosis. MV peak gradient, 10.9 mmHg. The mean mitral valve gradient is 6.0 mmHg. Tricuspid Valve: The tricuspid valve is grossly normal. Tricuspid valve regurgitation is trivial. Aortic Valve: Mean aortic valve gradient 46mHg, peak gradient 655mg, Vmax 4 m/s, AVA 0.6cm2, DVI 0.19. The aortic valve is calcified. Aortic valve regurgitation is not visualized. Severe aortic stenosis is present. Aortic valve mean gradient measures 33.3 mmHg. Aortic valve peak gradient measures 57.5 mmHg. Aortic valve area, by VTI measures 0.62  cm. Pulmonic Valve: The pulmonic valve was not well visualized. Pulmonic valve regurgitation is not visualized. Aorta: The aortic root is normal in size and structure. Venous: The inferior vena cava was not well visualized. IAS/Shunts: No atrial level shunt detected by color flow Doppler.  LEFT VENTRICLE PLAX 2D LVIDd:         4.00 cm   Diastology LVIDs:         2.80 cm   LV e' medial:    3.37 cm/s LV PW:         1.40 cm   LV E/e' medial:  44.8 LV IVS:        1.40 cm   LV e' lateral:   8.59 cm/s LVOT diam:     2.00 cm   LV E/e' lateral: 17.6 LV SV:         56 LV SV Index:   31 LVOT Area:     3.14 cm  RIGHT VENTRICLE RV Basal diam:  2.40 cm RV S prime:     13.50 cm/s TAPSE (M-mode): 2.3 cm LEFT ATRIUM             Index        RIGHT ATRIUM           Index LA diam:        3.70 cm 2.07 cm/m   RA Area:     12.80 cm LA Vol (A2C):   50.1 ml 27.97 ml/m  RA Volume:   29.00 ml  16.19 ml/m LA Vol (A4C):   45.6 ml 25.46 ml/m LA Biplane Vol: 50.4 ml 28.14 ml/m  AORTIC VALVE AV Area (Vmax):    0.62 cm AV Area (Vmean):   0.61 cm AV Area (VTI):     0.62 cm AV Vmax:           379.00 cm/s AV Vmean:          272.667 cm/s AV VTI:            0.899 m AV Peak Grad:      57.5 mmHg AV Mean Grad:      33.3 mmHg LVOT Vmax:         75.10 cm/s LVOT Vmean:        52.900 cm/s LVOT VTI:          0.177 m LVOT/AV VTI ratio: 0.20  AORTA Ao Root diam: 2.13 cm MITRAL VALVE                TRICUSPID VALVE MV Area (PHT): 5.16 cm     TR Peak grad:   22.8 mmHg MV Area VTI:   1.47 cm     TR Vmax:        239.00 cm/s MV Peak grad:  10.9 mmHg MV Mean grad:  6.0 mmHg     SHUNTS MV Vmax:       1.65 m/s     Systemic VTI:  0.18 m MV Vmean:      113.0 cm/s   Systemic Diam: 2.00 cm MV Decel Time: 147 msec MV E velocity: 151.00 cm/s MV A velocity: 135.00 cm/s MV E/A ratio:  1.12 Kate Sable MD Electronically signed by Kate Sable MD Signature Date/Time: 05/22/2022/3:59:07 PM    Final       Time coordinating discharge: Over 30  minutes  SIGNED:  Emeterio Reeve DO [facility]

## 2022-05-24 NOTE — Care Management Important Message (Signed)
Important Message  Patient Details  Name: Jasmine Webster MRN: [id number] Date of Birth: Jul 09, 1926   Medicare Important Message Given:  Yes     [name] 05/24/2022, 11:27 AM

## 2022-05-24 NOTE — Progress Notes (Signed)
  Transition of Care [facility]) Screening Note   Patient Details  Name: Jasmine Webster Date of Birth: 20-Oct-1926   Transition of Care [facility]) CM/SW Contact:    Alberteen Sam, LCSW Phone Number: 05/24/2022, 12:07 PM    Transition of Care Department [facility]) has reviewed patient and no TOC needs have been identified at this time. We will continue to monitor patient advancement through interdisciplinary progression rounds. If new patient transition needs arise, please place a TOC consult.  [place], [place]

## 2022-05-24 NOTE — Progress Notes (Signed)
Cardiology Progress Note   Patient Name: Jasmine Webster Date of Encounter: 05/24/2022  Primary Cardiologist: Ida Rogue, MD  Subjective   Feels well this AM.  Ambulated some to bathroom w/o symptoms or limitations.  Eager to go home.  Dtr @ bedside.  Questions answered re: TAVR f/u and planning.  Inpatient Medications    Scheduled Meds:  furosemide  20 mg Intravenous Daily   insulin aspart  0-9 Units Subcutaneous TID AC & HS   lisinopril  20 mg Oral Daily   pantoprazole  40 mg Oral BID AC   Continuous Infusions:  PRN Meds: acetaminophen **OR** acetaminophen, ALPRAZolam, magnesium hydroxide, ondansetron **OR** ondansetron (ZOFRAN) IV, traZODone   Vital Signs    Vitals:   05/23/22 2330 05/24/22 0358 05/24/22 0424 05/24/22 0724  BP: (!) 131/45 (!) 107/42  (!) 123/42  Pulse: 77 65  64  Resp: '18 18  16  '$ Temp: 98 F (36.7 C) 97.7 F (36.5 C)  (!) 97.5 F (36.4 C)  TempSrc: Oral Oral  Oral  SpO2: 95% 95%  100%  Weight:   67.9 kg   Height:        Intake/Output Summary (Last 24 hours) at 05/24/2022 0946 Last data filed at 05/23/2022 2342 Gross per 24 hour  Intake --  Output 2050 ml  Net -2050 ml   Filed Weights   05/21/22 1830 05/23/22 0459 05/24/22 0424  Weight: 73.7 kg 73.5 kg 67.9 kg    Physical Exam   GEN: Well nourished, well developed, in no acute distress.  HEENT: Grossly normal.  Neck: Supple, no JVD, radiated murmur bilat carotids, no masses. Cardiac: RRR, 3/6 SEM throughout, no rubs or gallops. No clubbing, cyanosis, edema.  Radials 2+, DP/PT 2+ and equal bilaterally.  Respiratory:  Respirations regular and unlabored, clear to auscultation bilaterally. GI: Soft, nontender, nondistended, BS + x 4. MS: no deformity or atrophy. Skin: warm and dry, no rash. Neuro:  Strength and sensation are intact. Psych: AAOx3.  Normal affect.  Labs    Chemistry Recent Labs  Lab 05/21/22 1833 05/22/22 0637 05/23/22 0634 05/24/22 0520  NA 130* 134* 134* 135   K 4.5 3.8 3.3* 4.2  CL 102 103 102 105  CO2 '22 23 25 24  '$ GLUCOSE 293* 137* 165* 138*  BUN 36* 32* 28* 37*  CREATININE 1.21* 1.15* 1.23* 1.32*  CALCIUM 8.3* 8.0* 8.4* 8.3*  PROT 5.9*  --   --   --   ALBUMIN 3.6  --   --   --   AST 18  --   --   --   ALT 12  --   --   --   ALKPHOS 114  --   --   --   BILITOT 0.6  --   --   --   GFRNONAA 42* 44* 41* 38*  ANIONGAP '6 8 7 6     '$ Hematology Recent Labs  Lab 05/22/22 7654 05/22/22 1435 05/23/22 0634 05/24/22 0520  WBC 8.4  --  6.0 6.0  RBC 3.63*  --  3.74* 3.37*  HGB 9.7* 10.2* 9.9* 9.0*  HCT 30.7* 32.2* 31.4* 28.9*  MCV 84.6  --  84.0 85.8  MCH 26.7  --  26.5 26.7  MCHC 31.6  --  31.5 31.1  RDW 14.8  --  15.1 15.4  PLT 81*  --  PLATELET CLUMPS NOTED ON SMEAR PLATELET CLUMPS NOTED ON SMEAR, UNABLE TO ESTIMATE    Cardiac Enzymes  Recent Labs  Lab 05/21/22 1833 05/21/22 2033 05/22/22 0637 05/22/22 1435 05/22/22 1915  TROPONINIHS 116* 523* 4,218* 3,556* 2,929*      BNP    Component Value Date/Time   BNP 300.3 (H) 05/21/2022 1833   Lipids  Lab Results  Component Value Date   CHOL 185 01/19/2021   HDL 53 01/19/2021   LDLCALC 102 (H) 01/19/2021   TRIG 175 (H) 01/19/2021   CHOLHDL 3.5 01/19/2021    HbA1c  Lab Results  Component Value Date   HGBA1C 7.4 (A) 04/11/2022    Radiology    DG Chest Portable 1 View  Result Date: 05/21/2022 CLINICAL DATA:  Chest pain.  Worsening dyspnea for 2 weeks. EXAM: PORTABLE CHEST 1 VIEW COMPARISON:  Chest radiograph 03/13/2022 and earlier; CT chest 05/16/2022. FINDINGS: The heart size and mediastinal contours are within normal limits. Aortic calcifications. Bibasilar interstitial opacities. Small patchy airspace opacity in the right costophrenic angle. No pleural effusion or pneumothorax. Anterior cervical spinal fusion. IMPRESSION: Bibasilar interstitial opacities and small patchy airspace opacity at the right costophrenic angle may reflect subsegmental atelectasis, aspiration  related changes or possibly infection in the appropriate clinical context. Aortic Atherosclerosis (ICD10-I70.0). Electronically Signed   By: Ileana Roup M.D.   On: 05/21/2022 18:57    Telemetry    RSR - Personally Reviewed  Cardiac Studies   Cardiac Catheterization  6.9.2023  Left Main  Ost LM lesion is 10% stenosed.  Left Anterior Descending  Prox LAD to Mid LAD lesion is 30% stenosed.  Ramus Intermedius  Ramus lesion is 60% stenosed.  Left Circumflex  The vessel exhibits minimal luminal irregularities.  Left Posterior Atrioventricular Artery  The vessel exhibits minimal luminal irregularities.  Right Coronary Artery  The vessel exhibits minimal luminal irregularities.   1.  Left dominant coronary arteries with moderate nonobstructive one-vessel coronary artery disease involving the ramus.  No evidence of obstructive disease. 2. Right heart catheterization showed normal RA pressure, moderately elevated wedge pressure, severe pulmonary hypertension and normal cardiac output.  Prominent V waves on pulmonary wedge tracing suggestive of mitral regurgitation. 3.  Severe aortic stenosis with mean gradient of 40 mmHg.     RA: 2 mmHg RV: 65/2 mmHg PA: 68/27 with a mean of 46 mmHg PCW: Mean pressure of 26 mmHg with prominent V waves at 38 mmHg. LVEDP: 30 mmHg. Cardiac output: 7.5 with an index of 4.3.   Recommendations: Proceed with TAVR evaluation.  No need for coronary revascularization. _____________   2D Echocardiogram 7.5.2023  1. Left ventricular ejection fraction, by estimation, is 55 to 60%. The  left ventricle has normal function. The left ventricle has no regional  wall motion abnormalities. There is mild left ventricular hypertrophy.  Left ventricular diastolic parameters  are consistent with Grade II diastolic dysfunction (pseudonormalization).   2. Right ventricular systolic function is normal. The right ventricular  size is normal.   3. The mitral valve is  degenerative. Mild mitral valve regurgitation.  Mild to moderate mitral stenosis. The mean mitral valve gradient is 6.0  mmHg. Moderate to severe mitral annular calcification.   4. Mean aortic valve gradient 79mHg, peak gradient 696mg, Vmax 4 m/s,  AVA 0.6cm2, DVI 0.19. The aortic valve is calcified. Aortic valve  regurgitation is not visualized. Severe aortic valve stenosis.  _____________   Patient Profile     [age].o. female w/ a h/o severe AS, HFpEF, nonobs CAD, HTN, HL, DMII, and PAH, who was admitted 7/4 with anemia and dark stools - s/p 2 units  PRBCs.  HsTrop up to 4218 in setting of anemia.  Assessment & Plan    1.  NSTEMI/Demand Ischemia/nonobstructive CAD:  In setting of anemia w/ hgb of 6  Chest pain and HsTrop elevation to 4218.  Echo w/ nl EF.  Cath last month w/ nonobs dzs.  No further c/p.  Completed 48hrs of heparin rx.  Home dose of ASA on hold in setting of presumed GIB.  BP intermittently soft - have been avoiding ? blocker.  Ideally should be on a statin, however she is intolerant.  Add zetia '10mg'$  daily.  2.  Anemia:  1 dark stool prior to admission.  On PPI @ home.  S/p 2 units prbcs.  H/H slowly drifting down over past three days - 9.0/28.9 this AM.  Seen by GI w/ rec for conservative mgmt given high risk for procedural complications.  ? Role of AS/heydes syndrome.  Will need early outpt f/u of cbc, preferably on Monday.  She notes that she has home health - unsure if they're able to draw labs.    3.  Severe AS:  Seen by TAVR team in [place].  Echo this admission w/ mean AoV gradient of 61mHg and peak grad of 638mg.  AVA 0.6cm^2.  Currently scheduled to see CT surgery in mid-August.  I spoke w/ our TAVR team and they will work on moving up CT surgery appt and TAVR date, as pt is now higher priority.  4.  Chronic HFpEF/PAH:  Nl EF by echo this admission.  Has been receiving lasix '20mg'$  IV daily (on 20 po @ home).  I/Os have been inaccurate.  It appears that she put out 2050 ml  yesterday, though no intake recorded.  Regardless, wt down to 67.9kg this AM.  Euvolemic on exam.  BUN/Creat bumping.  Will transition to lasix '20mg'$  qod, as she was taking at home previously.  HR/BP stable.  5.  Essential HTN:  Stable on diuretic rx only.  6.  DMII:  Insulin per IM.  Signed, ChMurray HodgkinsNP  05/24/2022, 9:46 AM    For questions or updates, please contact   Please consult [email or web address] for contact info under Cardiology/STEMI.

## 2022-05-26 DIAGNOSIS — D649 Anemia, unspecified: Secondary | ICD-10-CM

## 2022-05-26 DIAGNOSIS — I509 Heart failure, unspecified: Secondary | ICD-10-CM

## 2022-05-27 ENCOUNTER — Other Ambulatory Visit
Admission: RE | Admit: 2022-05-27 | Discharge: 2022-05-27 | Disposition: A | Discharge status: Home / Self Care | Payer: PPO | Attending: Nurse Practitioner | Admitting: Nurse Practitioner

## 2022-05-27 DIAGNOSIS — D649 Anemia, unspecified: Secondary | ICD-10-CM | POA: Insufficient documentation

## 2022-05-27 DIAGNOSIS — I509 Heart failure, unspecified: Secondary | ICD-10-CM | POA: Diagnosis not present

## 2022-05-27 LAB — BASIC METABOLIC PANEL
Anion gap: 6 (ref 5–15)
BUN: 31 mg/dL — ABNORMAL HIGH (ref 8–23)
CO2: 26 mmol/L (ref 22–32)
Calcium: 8.9 mg/dL (ref 8.9–10.3)
Chloride: 105 mmol/L (ref 98–111)
Creatinine, Ser: 1.23 mg/dL — ABNORMAL HIGH (ref 0.44–1.00)
GFR, Estimated: 41 mL/min — ABNORMAL LOW (ref >60)
Glucose, Bld: 119 mg/dL — ABNORMAL HIGH (ref 70–99)
Potassium: 4.7 mmol/L (ref 3.5–5.1)
Sodium: 137 mmol/L (ref 135–145)

## 2022-05-27 LAB — CBC
HCT: 32.7 % — ABNORMAL LOW (ref 36.0–46.0)
Hemoglobin: 10.1 g/dL — ABNORMAL LOW (ref 12.0–15.0)
MCH: 26.9 pg (ref 26.0–34.0)
MCHC: 30.9 g/dL (ref 30.0–36.0)
MCV: 87.2 fL (ref 80.0–100.0)
Platelets: 259 10*3/uL (ref 150–400)
RBC: 3.75 MIL/uL — ABNORMAL LOW (ref 3.87–5.11)
RDW: 15.4 % (ref 11.5–15.5)
WBC: 6 10*3/uL (ref 4.0–10.5)
nRBC: 0 % (ref 0.0–0.2)

## 2022-05-27 NOTE — Telephone Encounter (Signed)
You  Jasmine Gianotti, NP 42 minutes ago (7:42 AM)   [name],   I see in pt's discharge summary that she is to follow up with CBC and BMET. But I do not see when this is to be done, and I do not see an order as the message states. Do I need to place lab orders for pt? She currently has 3 month follow up in place with [name] 08/08/22.   Thanks,   [name], [name], NP  You 4 minutes ago (8:19 AM)   She will need a CBC and BMET this week.  We thought that perhaps the hospitalists would either arrange for this or have her f/u with primary care to assess, as we don't manage her anemia.  Assuming that she hasn't seen primary care up to this point, we should plan to own this, so yes, pls place orders for cbc/bmet for today/tomorrow [facility].  Structural heart team will reach out to her regarding follow-up with CT surgery.     Called and spoke with pt's daughter, [name] ([place]). Notified of message above and that we will order CBC and BMET to be done either today or tomorrow [facility].  [name] voiced understanding and of lab location/instructions.  [name] was very appreciative of assistance and has no further questions at this time.

## 2022-05-28 ENCOUNTER — Telehealth: Payer: Self-pay

## 2022-05-28 NOTE — Telephone Encounter (Signed)
  [facility]ENTER   MULTIDISCIPLINARY HEART VALVE TEAM  Pt was referred for TAVR evaluation and due to recent hospitalization GI and Cardiology asked that this evaluation be expedited. The pt's testing was reviewed this morning by the multidisciplinary heart valve team and unfortunately the pt does not have anatomy for TAVR.  The pt's aortic annulus area measured 260 mm2 by our CT and per Edwards measurements it was 252.9 mm2.  The smallest Edwards valve available on the market for TAVR is a 24m S3UR and the area for use with this size valve is 273-345.  The pt's images were also reviewed by [employer] and the pt was not a candidate for this valve due to unfavorable anatomy.   I contacted the pt's daughter, Jasmine Webster and made her aware of this information and I cancelled 7/12 cardiac surgery evaluation with Jasmine [name]  Jasmine TAli Lowehad requested to see the pt back in the office to discuss these findings and an appointment was booked for 05/31/2022.  Jasmine Gladalso inquired if the pt needed oxygen in the home and I made her aware that we will assess her in the office to see if she meets Medicare requirements.   Later in the morning, Jasmine Webster contacted me and requested to cancel the Friday appointment.  She states that her and her brother spoke with the pt and made her aware that she is not a candidate for TAVR due to unfavorable anatomy.  The pt said that she understood and that she was very appreciative of the care that she received with our team and that she did not feel like an appointment was needed for further explanation.  [facility] if [facility] is a [facility] for oxygen and continue with general cardiology follow-up.  Currently the pt has an appointment in September but I made Jasmine Gladaware that I will forward a note to see if earlier follow-up can be arranged.    I also received a call from Jasmine HByrd Hesselbach([phone] who is a close friend of  the family and listed on DPR to discuss the pt's care.  Jasmine Gladhad contacted her in regards to my conversation and she requested information in regards to the pt's anatomy, so that she could also assist if needed in explaining the findings.  Jasmine CArbie Cookeydid have concerns about the pt's anemia going forward since her aortic valve could not be fixed and managing the pt's SOB and chest discomfort if anemia is untreated.  She felt that Cardiology needs to weigh in on what is an acceptable range for H & H going forward to minimize her symptoms.  I advised Jasmine CArbie Cookeythat I would forward this message to the cardiology, GI and primary team for further advisement.

## 2022-05-29 ENCOUNTER — Encounter: Payer: PPO | Admitting: Surgery

## 2022-05-31 ENCOUNTER — Other Ambulatory Visit (HOSPITAL_COMMUNITY): Payer: PPO

## 2022-05-31 ENCOUNTER — Ambulatory Visit: Payer: PPO | Admitting: Internal Medicine

## 2022-06-03 NOTE — Progress Notes (Unsigned)
Date:  06/04/2022   ID:  [name], DOB 1926-05-14, MRN [id number]  Patient Location:  [place] [id number]   Provider location:   [facility], [place] office  PCP:  Jerrol Banana., MD  Cardiologist:  Arvid Right [facility]  Chief Complaint  Patient presents with   [facility] follow up     "Doing well." Medications reviewed by the patient verbally.     History of Present Illness:    Jasmine Webster is a 88 y.o. female  past medical history of Diabetes type 2, hemoglobin A1c 6.8 GERD Hyperlipidemia Hypertension Arthritis Non smoker Who presents for chest pain on exertion, angina, aortic valve stenosis  Last seen in clinic by myself April 22, 2022 On that visit reported worsening shortness of breath, some chest tightness on overexertion Family was concerned that she was unable to walk a good distance at a recent wedding  Underwent recent left heart catheterization April 26, 2022 out of concern for underlying ischemia and needing evaluation of her aortic valve Catheterization showed nonobstructive disease Severe aortic valve stenosis with mean gradient 40 mmHg recorded Severe pulmonary hypertension on right heart catheterization Was referred to TAVR clinic  Seen by [facility] TAVR clinic Valve will not fit  per the notes, anatomy is not suitable for prosthetic valve  Was seen in the hospital early July 2023, non-STEMI /demand ischemia reported having dark stools at home, noted to be anemic hemoglobin of 6 transfusion x2 given Troponin 4000  In follow-up today reports that she feels well Hemoglobin last week around 10  Reports that she is active, can do her daily ADLs Understands that she cannot have TAVR at this time Denies tachypalpitations, no significant lower extremity edema, no PND orthopnea  EKG personally reviewed by myself on todays visit Normal sinus rhythm rate 70 bpm left bundle branch block   Past Medical History:   Diagnosis Date   Arthritis    Cataract    cataract removal bilaterally approx 20 years ago   Chronic back pain    Coronary artery disease    COVID-19 virus infection 12/2020   Diabetes mellitus without complication ([place])    Heart murmur    Hyperlipidemia    Hypertension    Past Surgical History:  Procedure Laterality Date   ABDOMINAL HYSTERECTOMY     APPENDECTOMY     BACK SURGERY     three   CARDIAC CATHETERIZATION     6+yrs no stents Dr. Verneda Skill   CATARACT EXTRACTION     RIGHT/LEFT HEART CATH AND CORONARY ANGIOGRAPHY N/A 04/26/2022   Procedure: RIGHT/LEFT HEART CATH AND CORONARY ANGIOGRAPHY;  Surgeon: Wellington Hampshire, MD;  Location: [place] CV LAB;  Service: Cardiovascular;  Laterality: N/A;   THORACOLUMBAR SYRINGO SHUNT     TONSILLECTOMY AND ADENOIDECTOMY       Current Meds  Medication Sig   ALPRAZolam (XANAX) 0.5 MG tablet TAKE 1 TABLET BY MOUTH EVERY DAY AT BEDTIME AS NEEDED   Artificial Tear Ointment (DRY EYES OP) Apply 1 drop to eye daily.   aspirin EC 81 MG tablet Take 81 mg by mouth daily.   co-enzyme Q-10 30 MG capsule Take 30 mg by mouth daily.   ezetimibe (ZETIA) 10 MG tablet Take 1 tablet (10 mg total) by mouth daily.   furosemide (LASIX) 20 MG tablet Take 1 tablet (20 mg total) by mouth every other day.   lisinopril (ZESTRIL) 10 MG tablet Take 1  tablet (10 mg total) by mouth daily.   Magnesium 400 MG CAPS Take 400 mg by mouth daily.   Multiple Vitamins-Minerals (PRESERVISION AREDS) CAPS Take 1 capsule by mouth 2 (two) times daily.    nitroGLYCERIN (NITROSTAT) 0.4 MG SL tablet Place 1 tablet (0.4 mg total) under the tongue every 5 (five) minutes as needed for chest pain.   Omega-3 Fatty Acids (FISH OIL PO) Take by mouth daily.   omeprazole (PRILOSEC) 40 MG capsule Take 1 capsule (40 mg total) by mouth daily.   ondansetron (ZOFRAN) 4 MG tablet TAKE 1 TABLET BY MOUTH EVERY 8 HOURS AS NEEDED FOR NAUSEA AND VOMITING   pantoprazole (PROTONIX) 40 MG  tablet Take 1 tablet (40 mg total) by mouth 2 (two) times daily before a meal.   potassium chloride (KLOR-CON) 10 MEQ tablet TAKE 1 TABLET BY MOUTH 2-3 TIMES A WEEK WITH FUROSEMIDE AS DIRECTED   SEMGLEE, YFGN, 100 UNIT/ML Pen 18 Units daily.   Turmeric 500 MG CAPS Take by mouth daily.   vitamin B-12 (CYANOCOBALAMIN) 500 MCG tablet Take 500 mcg by mouth once a week.     Allergies:   Etodolac, Naproxen, Statins, Latex, Terbinafine, and Terbinafine hcl   Social History   Tobacco Use   Smoking status: Never   Smokeless tobacco: Never  Vaping Use   Vaping Use: Never used  Substance Use Topics   Alcohol use: No    Alcohol/week: 0.0 standard drinks of alcohol   Drug use: No     Family Hx: The patient's family history includes Cancer in her brother and brother; Diabetes in her father; Heart attack in her mother; Heart disease in her mother and sister; Pneumonia in her father; Stroke in her brother, father, and sister; Sudden death in her brother, sister, and sister.  ROS:   Please see the history of present illness.    Review of Systems  Constitutional: Negative.   Respiratory:  Positive for shortness of breath.   Cardiovascular: Negative.   Gastrointestinal: Negative.   Musculoskeletal:  Positive for back pain.  Neurological: Negative.   Psychiatric/Behavioral: Negative.    All other systems reviewed and are negative.    Labs/Other Tests and Data Reviewed:    Recent Labs: 11/26/2021: Magnesium 2.0; TSH 2.066 05/21/2022: ALT 12; B Natriuretic Peptide 300.3 05/27/2022: BUN 31; Creatinine, Ser 1.23; Hemoglobin 10.1; Platelets 259; Potassium 4.7; Sodium 137   Recent Lipid Panel Lab Results  Component Value Date/Time   CHOL 185 01/19/2021 08:05 AM   TRIG 175 (H) 01/19/2021 08:05 AM   HDL 53 01/19/2021 08:05 AM   CHOLHDL 3.5 01/19/2021 08:05 AM   LDLCALC 102 (H) 01/19/2021 08:05 AM    Wt Readings from Last 3 Encounters:  06/04/22 149 lb 8 oz (67.8 kg)  05/24/22 149 lb 11.2 oz  (67.9 kg)  05/10/22 152 lb (68.9 kg)     Exam:    BP (!) 130/46 (BP Location: Left Arm, Patient Position: Sitting, Cuff Size: Normal)   Pulse 70   Ht 5' 4.5" (1.638 m)   Wt 149 lb 8 oz (67.8 kg)   SpO2 98%   BMI 25.27 kg/m  Constitutional:  oriented to person, place, and time. No distress.  HENT:  Head: Grossly normal Eyes:  no discharge. No scleral icterus.  Neck: No JVD, no carotid bruits  Cardiovascular: Regular rate and rhythm, 3/6 systolic ejection murmur appreciated right sternal border Pulmonary/Chest: Clear to auscultation bilaterally, no wheezes or rails Abdominal: Soft.  no distension.  no  tenderness.  Musculoskeletal: Normal range of motion Neurological:  normal muscle tone. Coordination normal. No atrophy Skin: Skin warm and dry Psychiatric: normal affect, pleasant   ASSESSMENT & PLAN:    Aortic valve stenosis, etiology of cardiac valve disease unspecified  Severe aortic valve stenosis on catheterization Nonobstructive coronary disease on catheterization She has met with the TAVR team, anatomy is not suitable for prosthetic valve Medical management recommended at this time  Pulmonary hypertension ([place]) -  Previously noted on echocardiogram and on cardiac catheterization Likely exacerbated by aortic valve disease Stable renal function, on Lasix every other day Continue current dosing, relatively asymptomatic  Hyperlipidemia Currently not on a statin  Hypertension Blood pressure is well controlled on today's visit. No changes made to the medications.  Left bundle branch block Dates back several years Nonobstructive coronary disease on catheterization  Anemia In the setting of GI bleed Responded well to transfusion x2 in the hospital Reports that she has follow-up with GI Repeat CBC ordered in 1 month We will closely monitor for dark stools   Total encounter time more than 30 minutes  Greater than 50% was spent in counseling and coordination of care  with the patient    Signed, Ida Rogue, MD  06/04/2022 3:31 PM    [facility] [address], [place], [place] [address]

## 2022-06-04 ENCOUNTER — Encounter: Payer: Self-pay | Admitting: Cardiovascular Disease

## 2022-06-04 ENCOUNTER — Ambulatory Visit: Payer: PPO | Admitting: Cardiovascular Disease

## 2022-06-04 VITALS — BP 130/46 | HR 70 | Ht 64.5 in | Wt 149.5 lb

## 2022-06-04 DIAGNOSIS — I350 Nonrheumatic aortic (valve) stenosis: Secondary | ICD-10-CM

## 2022-06-04 DIAGNOSIS — D649 Anemia, unspecified: Secondary | ICD-10-CM

## 2022-06-04 DIAGNOSIS — Z794 Long term (current) use of insulin: Secondary | ICD-10-CM

## 2022-06-04 DIAGNOSIS — I272 Pulmonary hypertension, unspecified: Secondary | ICD-10-CM | POA: Diagnosis not present

## 2022-06-04 DIAGNOSIS — E782 Mixed hyperlipidemia: Secondary | ICD-10-CM | POA: Diagnosis not present

## 2022-06-04 DIAGNOSIS — E118 Type 2 diabetes mellitus with unspecified complications: Secondary | ICD-10-CM

## 2022-06-04 DIAGNOSIS — R0602 Shortness of breath: Secondary | ICD-10-CM | POA: Diagnosis not present

## 2022-06-04 NOTE — Patient Instructions (Addendum)
Medication Instructions:  No changes  If you need a refill on your cardiac medications before your next appointment, please call your pharmacy.   Lab work: Your physician recommends that you return for lab work (CBC) in: 4 weeks  - Please go to the [facility]. You will check in at the front desk to the right as you walk into the atrium. Valet Parking is offered if needed. - No appointment needed. You may go any day between 7 am and 6 pm.    Testing/Procedures: No new testing needed  Follow-Up: At [facility], you and your health needs are our priority.  As part of our continuing mission to provide you with exceptional heart care, we have created designated Provider Care Teams.  These Care Teams include your primary Cardiologist (physician) and Advanced Practice Providers (APPs -  Physician Assistants and Nurse Practitioners) who all work together to provide you with the care you need, when you need it.  You will need a follow up appointment in 3 months  Providers on your designated Care Team:   Murray Hodgkins, NP Christell Faith, PA-C Cadence Kathlen Mody, [place]  COVID-19 Vaccine Information can be found at: ShippingScam.co.uk For questions related to vaccine distribution or appointments, please email [email or web address] or call [phone].

## 2022-06-10 ENCOUNTER — Ambulatory Visit: Payer: PPO | Admitting: Family

## 2022-06-11 NOTE — Progress Notes (Deleted)
   Patient ID: Jasmine Webster, female    DOB: 11/15/1926, 88 y.o.   MRN: [id number]  HPI  [name] is a 88 y/o female with a history of  Echo report from 05/22/22 reviewed and showed an EF of 55-60% along with mild LVH, mild MR and severe AS.  LHC/RHC done 04/26/22 and showed:   Ost LM lesion is 10% stenosed.   Ramus lesion is 60% stenosed.   Prox LAD to Mid LAD lesion is 30% stenosed. 1.  Left dominant coronary arteries with moderate nonobstructive one-vessel coronary artery disease involving the ramus.  No evidence of obstructive disease. 2. Right heart catheterization showed normal RA pressure, moderately elevated wedge pressure, severe pulmonary hypertension and normal cardiac output.  Prominent V waves on pulmonary wedge tracing suggestive of mitral regurgitation. 3.  Severe aortic stenosis with mean gradient of 40 mmHg.    Admitted 05/21/22 due to acute onset of worsening dyspnea over the last couple of weeks as well as midsternal chest pain due to NSTEMI and HF exacerbation. Initially given IV lasix with transition to oral diuretics. Cardiology and GI consults obtained. 2 units PRBC's given for anemia. RHC/LHC done.   Discharged after 3 days.   She presents today for her initial visit with a chief complaint of   Review of Systems    Physical Exam    Assessment & Plan:  1: Chronic heart failure with preserved ejection fraction with structural changes (LVH)- - NYHA class - BNP 05/21/22 was 300.3  2: HTN- - BP - saw PCP Rosanna Randy) 04/11/22 - BMP 05/27/22 reviewed and showed sodium 137, potassium 4.7, creatinine 1.23 and GFR 41  3: DM- - A1c 04/11/22 was 7.4%  4: Severe aortic stenosis- - saw cardiology Rockey Situ) 06/04/22 - going to be worked up for possible TAVR

## 2022-06-12 ENCOUNTER — Ambulatory Visit: Payer: PPO | Admitting: Family

## 2022-06-12 ENCOUNTER — Telehealth: Payer: Self-pay

## 2022-06-12 NOTE — Telephone Encounter (Signed)
Copied from [place] [phone]. Topic: General - Inquiry >> Jun 12, 2022 10:48 AM Tiffany B wrote: Reason for CRM: patient unable to walk very far and patient daughter would like her to have a wheel chair. Patient states she has a walker but would like PCP to write an order for a wheel chair and mail to patient home.

## 2022-06-13 ENCOUNTER — Other Ambulatory Visit: Payer: Self-pay

## 2022-06-13 NOTE — Telephone Encounter (Signed)
Done

## 2022-06-17 ENCOUNTER — Other Ambulatory Visit (HOSPITAL_BASED_OUTPATIENT_CLINIC_OR_DEPARTMENT_OTHER): Payer: Self-pay | Admitting: Osteopathic Medicine

## 2022-06-17 ENCOUNTER — Other Ambulatory Visit: Payer: Self-pay | Admitting: Physician Assistant

## 2022-06-17 DIAGNOSIS — F419 Anxiety disorder, unspecified: Secondary | ICD-10-CM

## 2022-06-20 ENCOUNTER — Other Ambulatory Visit
Admission: RE | Admit: 2022-06-20 | Discharge: 2022-06-20 | Disposition: A | Discharge status: Home / Self Care | Payer: PPO | Attending: Cardiovascular Disease | Admitting: Cardiovascular Disease

## 2022-06-20 ENCOUNTER — Encounter: Payer: Self-pay | Admitting: Podiatry

## 2022-06-20 ENCOUNTER — Other Ambulatory Visit
Admission: RE | Admit: 2022-06-20 | Discharge: 2022-06-20 | Disposition: A | Discharge status: Home / Self Care | Payer: PPO | Attending: Nephrology | Admitting: Nephrology

## 2022-06-20 ENCOUNTER — Ambulatory Visit: Payer: PPO | Admitting: Podiatry

## 2022-06-20 DIAGNOSIS — D649 Anemia, unspecified: Secondary | ICD-10-CM

## 2022-06-20 DIAGNOSIS — I509 Heart failure, unspecified: Secondary | ICD-10-CM | POA: Diagnosis not present

## 2022-06-20 DIAGNOSIS — M79674 Pain in right toe(s): Secondary | ICD-10-CM

## 2022-06-20 DIAGNOSIS — E119 Type 2 diabetes mellitus without complications: Secondary | ICD-10-CM

## 2022-06-20 DIAGNOSIS — B351 Tinea unguium: Secondary | ICD-10-CM

## 2022-06-20 DIAGNOSIS — E1122 Type 2 diabetes mellitus with diabetic chronic kidney disease: Secondary | ICD-10-CM | POA: Diagnosis not present

## 2022-06-20 DIAGNOSIS — M79675 Pain in left toe(s): Secondary | ICD-10-CM | POA: Diagnosis not present

## 2022-06-20 DIAGNOSIS — D631 Anemia in chronic kidney disease: Secondary | ICD-10-CM | POA: Diagnosis not present

## 2022-06-20 DIAGNOSIS — I130 Hypertensive heart and chronic kidney disease with heart failure and stage 1 through stage 4 chronic kidney disease, or unspecified chronic kidney disease: Secondary | ICD-10-CM | POA: Insufficient documentation

## 2022-06-20 DIAGNOSIS — R809 Proteinuria, unspecified: Secondary | ICD-10-CM | POA: Diagnosis not present

## 2022-06-20 DIAGNOSIS — N184 Chronic kidney disease, stage 4 (severe): Secondary | ICD-10-CM | POA: Diagnosis not present

## 2022-06-20 LAB — URINALYSIS, COMPLETE (UACMP) WITH MICROSCOPIC
Bilirubin Urine: NEGATIVE
Glucose, UA: NEGATIVE mg/dL
Hgb urine dipstick: NEGATIVE
Ketones, ur: NEGATIVE mg/dL
Leukocytes,Ua: NEGATIVE
Nitrite: NEGATIVE
Protein, ur: NEGATIVE mg/dL
Specific Gravity, Urine: 1.01 (ref 1.005–1.030)
Squamous Epithelial / HPF: NONE SEEN (ref 0–5)
pH: 5 (ref 5.0–8.0)

## 2022-06-20 LAB — CBC WITH DIFFERENTIAL/PLATELET
Abs Immature Granulocytes: 0.02 10*3/uL (ref 0.00–0.07)
Basophils Absolute: 0.1 10*3/uL (ref 0.0–0.1)
Basophils Relative: 1 %
Eosinophils Absolute: 0.1 10*3/uL (ref 0.0–0.5)
Eosinophils Relative: 2 %
HCT: 25.2 % — ABNORMAL LOW (ref 36.0–46.0)
Hemoglobin: 7.6 g/dL — ABNORMAL LOW (ref 12.0–15.0)
Immature Granulocytes: 0 %
Lymphocytes Relative: 28 %
Lymphs Abs: 1.8 10*3/uL (ref 0.7–4.0)
MCH: 26.4 pg (ref 26.0–34.0)
MCHC: 30.2 g/dL (ref 30.0–36.0)
MCV: 87.5 fL (ref 80.0–100.0)
Monocytes Absolute: 0.7 10*3/uL (ref 0.1–1.0)
Monocytes Relative: 11 %
Neutro Abs: 3.6 10*3/uL (ref 1.7–7.7)
Neutrophils Relative %: 58 %
RBC: 2.88 MIL/uL — ABNORMAL LOW (ref 3.87–5.11)
RDW: 16.6 % — ABNORMAL HIGH (ref 11.5–15.5)
WBC: 6.3 10*3/uL (ref 4.0–10.5)
nRBC: 0 % (ref 0.0–0.2)

## 2022-06-20 LAB — RENAL FUNCTION PANEL
Albumin: 3.5 g/dL (ref 3.5–5.0)
Anion gap: 7 (ref 5–15)
BUN: 31 mg/dL — ABNORMAL HIGH (ref 8–23)
CO2: 22 mmol/L (ref 22–32)
Calcium: 8.9 mg/dL (ref 8.9–10.3)
Chloride: 108 mmol/L (ref 98–111)
Creatinine, Ser: 1.36 mg/dL — ABNORMAL HIGH (ref 0.44–1.00)
GFR, Estimated: 36 mL/min — ABNORMAL LOW (ref >60)
Glucose, Bld: 136 mg/dL — ABNORMAL HIGH (ref 70–99)
Phosphorus: 4.1 mg/dL (ref 2.5–4.6)
Potassium: 4.4 mmol/L (ref 3.5–5.1)
Sodium: 137 mmol/L (ref 135–145)

## 2022-06-20 LAB — PROTEIN / CREATININE RATIO, URINE
Creatinine, Urine: 73 mg/dL
Total Protein, Urine: <6 mg/dL

## 2022-06-20 NOTE — Progress Notes (Signed)
This patient returns to my office for at risk foot care.  This patient requires this care by a professional since this patient will be at risk due to having diabetes  This patient is unable to cut nails herself since the patient cannot reach her nails.These nails are painful walking and wearing shoes.  This patient presents for at risk foot care today.  General Appearance  Alert, conversant and in no acute stress.  Vascular  Dorsalis pedis and posterior tibial  pulses are weakly  palpable  bilaterally.  Capillary return is within normal limits  bilaterally. Temperature is within normal limits  bilaterally.  Neurologic  Senn-Weinstein monofilament wire test within normal limits  bilaterally. Muscle power within normal limits bilaterally.  Nails Thick disfigured discolored nails with subungual debris  from hallux to fifth toes bilaterally except nail plate left hallux.. No evidence of bacterial infection or drainage bilaterally.  Orthopedic  No limitations of motion  feet .  No crepitus or effusions noted.  HAV  B/L.  Hammer toes second  B/L.  Skin  normotropic skin with no porokeratosis noted bilaterally.  No signs of infections or ulcers noted.     Onychomycosis  Pain in right toes  Pain in left toes  Consent was obtained for treatment procedures.   Mechanical debridement of nails 1-5  bilaterally performed with a nail nipper.  Filed with dremel without incident.    Return office visit   4 months                   Told patient to return for periodic foot care and evaluation due to potential at risk complications.   Ladell Lea DPM  

## 2022-06-21 LAB — PTH, INTACT AND CALCIUM
Calcium, Total (PTH): 9 mg/dL (ref 8.7–10.3)
PTH: 42 pg/mL (ref 15–65)

## 2022-06-22 ENCOUNTER — Encounter: Payer: Self-pay | Admitting: Family Medicine

## 2022-06-24 ENCOUNTER — Telehealth: Payer: Self-pay

## 2022-06-24 ENCOUNTER — Other Ambulatory Visit: Payer: Self-pay | Admitting: *Deleted

## 2022-06-24 ENCOUNTER — Ambulatory Visit: Payer: Self-pay

## 2022-06-24 DIAGNOSIS — D649 Anemia, unspecified: Secondary | ICD-10-CM

## 2022-06-24 NOTE — Telephone Encounter (Signed)
Please advise message below  °

## 2022-06-24 NOTE — Telephone Encounter (Signed)
Answer Assessment - Initial Assessment Questions 1. REASON FOR CALL or QUESTION: "What is your reason for calling today?" or "How can I best help you?" or "What question do you have that I can help answer?"     Call from daughter [name] regarding recent blood work for pt. Daughter would like for pt to have transfusion today, rather than having pt go to hematologist. Daughter states that pt is "dragging".  Protocols used: Information Only Call - No Triage-A-AH

## 2022-06-24 NOTE — Telephone Encounter (Signed)
Received call from [name] this morning following up on conversations regarding recent blood work for pt.  Daughter , [name] would like pt to be able to get blood transfusions this morning. [name] states that her mom is "dragging". She does not want to put her mother through waiting and seeing another provider prior to getting transfusion. [name] would like Dr. Byrd Hesselbach to place order for transfusion today.   Please advise daughter, [name] at [phone]

## 2022-06-24 NOTE — Telephone Encounter (Signed)
  Chief Complaint: Abnormal blood work Symptoms: "Dragging" Frequency: ongoing Pertinent Negatives: Patient denies  Disposition: '[]'$ ED /'[]'$ Urgent Care (no appt availability in office) / '[]'$ Appointment(In office/virtual)/ '[]'$  [facility]/ '[]'$ Home Care/ '[]'$ Refused Recommended Disposition /'[]'$ [facility]/ '[x]'$  Follow-up with PCP Additional Notes: Also sent telephone call encounter to office. Daughter would like for pt to get blood transfusion this morning rather than taking pt to another provider.

## 2022-06-24 NOTE — Telephone Encounter (Signed)
After speaking with Dr Caryn Section, I have spoken with the daughter and explained that we have put in the order to see hematology.  Also explained that we can not order a direct transfusion.  She may check with her nephrologist or go through the ER.  [name] states the nephrologist does not do direct transfusions either.  She is going to wait on the referral but is aware to go to the ER if symptoms develop, change or worsen.

## 2022-06-24 NOTE — Telephone Encounter (Signed)
Maybe, on the border for requiring a blood transfusion. Recommend referral to hematology since this appears to be a recurring problem. They should be able to see her this week.  If she has any dizziness, shortness of breath, mental status changes or unusually weakness then she should go to ER.

## 2022-06-26 ENCOUNTER — Inpatient Hospital Stay: Payer: PPO | Attending: Oncology | Admitting: Oncology

## 2022-06-26 ENCOUNTER — Encounter: Payer: Self-pay | Admitting: Oncology

## 2022-06-26 ENCOUNTER — Inpatient Hospital Stay: Payer: PPO

## 2022-06-26 VITALS — BP 124/84 | HR 82 | Temp 98.4°F | Wt 149.0 lb

## 2022-06-26 DIAGNOSIS — N183 Chronic kidney disease, stage 3 unspecified: Secondary | ICD-10-CM | POA: Diagnosis not present

## 2022-06-26 DIAGNOSIS — D509 Iron deficiency anemia, unspecified: Secondary | ICD-10-CM | POA: Insufficient documentation

## 2022-06-26 DIAGNOSIS — R42 Dizziness and giddiness: Secondary | ICD-10-CM | POA: Diagnosis not present

## 2022-06-26 DIAGNOSIS — E1122 Type 2 diabetes mellitus with diabetic chronic kidney disease: Secondary | ICD-10-CM | POA: Diagnosis not present

## 2022-06-26 DIAGNOSIS — R0602 Shortness of breath: Secondary | ICD-10-CM | POA: Insufficient documentation

## 2022-06-26 DIAGNOSIS — D500 Iron deficiency anemia secondary to blood loss (chronic): Secondary | ICD-10-CM

## 2022-06-26 DIAGNOSIS — I129 Hypertensive chronic kidney disease with stage 1 through stage 4 chronic kidney disease, or unspecified chronic kidney disease: Secondary | ICD-10-CM | POA: Insufficient documentation

## 2022-06-26 DIAGNOSIS — R5383 Other fatigue: Secondary | ICD-10-CM | POA: Diagnosis not present

## 2022-06-26 DIAGNOSIS — Z7982 Long term (current) use of aspirin: Secondary | ICD-10-CM | POA: Insufficient documentation

## 2022-06-26 DIAGNOSIS — D649 Anemia, unspecified: Secondary | ICD-10-CM | POA: Insufficient documentation

## 2022-06-26 DIAGNOSIS — D631 Anemia in chronic kidney disease: Secondary | ICD-10-CM | POA: Insufficient documentation

## 2022-06-26 DIAGNOSIS — N189 Chronic kidney disease, unspecified: Secondary | ICD-10-CM | POA: Diagnosis not present

## 2022-06-26 DIAGNOSIS — Z7902 Long term (current) use of antithrombotics/antiplatelets: Secondary | ICD-10-CM | POA: Diagnosis not present

## 2022-06-26 DIAGNOSIS — Z79899 Other long term (current) drug therapy: Secondary | ICD-10-CM | POA: Insufficient documentation

## 2022-06-26 DIAGNOSIS — K921 Melena: Secondary | ICD-10-CM | POA: Diagnosis not present

## 2022-06-26 LAB — CBC WITH DIFFERENTIAL/PLATELET
Abs Immature Granulocytes: 0.02 10*3/uL (ref 0.00–0.07)
Basophils Absolute: 0 10*3/uL (ref 0.0–0.1)
Basophils Relative: 0 %
Eosinophils Absolute: 0.2 10*3/uL (ref 0.0–0.5)
Eosinophils Relative: 3 %
HCT: 23.8 % — ABNORMAL LOW (ref 36.0–46.0)
Hemoglobin: 7.2 g/dL — ABNORMAL LOW (ref 12.0–15.0)
Immature Granulocytes: 0 %
Lymphocytes Relative: 29 %
Lymphs Abs: 1.7 10*3/uL (ref 0.7–4.0)
MCH: 26.8 pg (ref 26.0–34.0)
MCHC: 30.3 g/dL (ref 30.0–36.0)
MCV: 88.5 fL (ref 80.0–100.0)
Monocytes Absolute: 0.7 10*3/uL (ref 0.1–1.0)
Monocytes Relative: 11 %
Neutro Abs: 3.4 10*3/uL (ref 1.7–7.7)
Neutrophils Relative %: 57 %
Platelets: 114 10*3/uL — ABNORMAL LOW (ref 150–400)
RBC: 2.69 MIL/uL — ABNORMAL LOW (ref 3.87–5.11)
RDW: 16.4 % — ABNORMAL HIGH (ref 11.5–15.5)
WBC: 6 10*3/uL (ref 4.0–10.5)
nRBC: 0 % (ref 0.0–0.2)

## 2022-06-26 LAB — HEPATIC FUNCTION PANEL
ALT: 10 U/L (ref 0–44)
AST: 17 U/L (ref 15–41)
Albumin: 3.6 g/dL (ref 3.5–5.0)
Alkaline Phosphatase: 105 U/L (ref 38–126)
Bilirubin, Direct: <0.1 mg/dL (ref 0.0–0.2)
Total Bilirubin: 0.4 mg/dL (ref 0.3–1.2)
Total Protein: 6.2 g/dL — ABNORMAL LOW (ref 6.5–8.1)

## 2022-06-26 LAB — BASIC METABOLIC PANEL
Anion gap: 10 (ref 5–15)
BUN: 29 mg/dL — ABNORMAL HIGH (ref 8–23)
CO2: 23 mmol/L (ref 22–32)
Calcium: 8.3 mg/dL — ABNORMAL LOW (ref 8.9–10.3)
Chloride: 100 mmol/L (ref 98–111)
Creatinine, Ser: 1.43 mg/dL — ABNORMAL HIGH (ref 0.44–1.00)
GFR, Estimated: 34 mL/min — ABNORMAL LOW (ref >60)
Glucose, Bld: 204 mg/dL — ABNORMAL HIGH (ref 70–99)
Potassium: 4.2 mmol/L (ref 3.5–5.1)
Sodium: 133 mmol/L — ABNORMAL LOW (ref 135–145)

## 2022-06-26 LAB — FOLATE: Folate: 34 ng/mL (ref >5.9)

## 2022-06-26 LAB — LACTATE DEHYDROGENASE: LDH: 134 U/L (ref 98–192)

## 2022-06-26 LAB — RETIC PANEL
Immature Retic Fract: 30.9 % — ABNORMAL HIGH (ref 2.3–15.9)
RBC.: 2.69 MIL/uL — ABNORMAL LOW (ref 3.87–5.11)
Retic Count, Absolute: 51.9 10*3/uL (ref 19.0–186.0)
Retic Ct Pct: 1.9 % (ref 0.4–3.1)
Reticulocyte Hemoglobin: 22.2 pg — ABNORMAL LOW (ref >27.9)

## 2022-06-26 LAB — IRON AND TIBC
Iron: 15 ug/dL — ABNORMAL LOW (ref 28–170)
Saturation Ratios: 4 % — ABNORMAL LOW (ref 10.4–31.8)
TIBC: 407 ug/dL (ref 250–450)
UIBC: 392 ug/dL

## 2022-06-26 LAB — FERRITIN: Ferritin: 7 ng/mL — ABNORMAL LOW (ref 11–307)

## 2022-06-26 LAB — VITAMIN B12: Vitamin B-12: 2429 pg/mL — ABNORMAL HIGH (ref 180–914)

## 2022-06-26 LAB — DAT, POLYSPECIFIC AHG (ARMC ONLY): Polyspecific AHG test: NEGATIVE

## 2022-06-26 LAB — SAMPLE TO BLOOD BANK

## 2022-06-26 NOTE — Assessment & Plan Note (Addendum)
Previous labs reviewed and discussed with patient Check CBC, BMP, LFT, iron, TIBC ferritin, LDH -Today's labs are consistent with iron deficiency anemia.  Likely due to chronic GI blood loss. I recommend IV Venofer treatments. I discussed about the potential risks including but not limited to allergic reactions/infusion reactions including anaphylactic reactions, phlebitis, high blood pressure, wheezing, SOB, skin rash, weight gain, leg swelling, headache, nausea and fatigue, etc. patient desires to achieved higher level of iron faster for adequate hematopoesis and agrees with IV Venofer. Plan IV venofer x 5 Her hemoglobin is low at 7.2, patient is symptomatic.  Recommend 1 unit of PRBC transfusion.

## 2022-06-26 NOTE — Progress Notes (Addendum)
Hematology/Oncology Consult note [phone] [phone]      Patient Care Team: Jerrol Banana., MD as PCP - General (Family Medicine) Rockey Situ Kathlene November, MD as PCP - Cardiology (Cardiology) Birder Robson, MD as Referring Physician (Ophthalmology) Gardiner Barefoot, DPM as Consulting Physician (Podiatry) Rockey Situ, Kathlene November, MD as Consulting Physician (Cardiology)   REFERRING PROVIDER: Jerrol Banana.,*  CHIEF COMPLAINTS/REASON FOR VISIT:  Anemia  ASSESSMENT & PLAN:  Iron deficiency anemia Previous labs reviewed and discussed with patient Check CBC, BMP, LFT, iron, TIBC ferritin, LDH -Today's labs are consistent with iron deficiency anemia.  Likely due to chronic GI blood loss. I recommend IV Venofer treatments. I discussed about the potential risks including but not limited to allergic reactions/infusion reactions including anaphylactic reactions, phlebitis, high blood pressure, wheezing, SOB, skin rash, weight gain, leg swelling, headache, nausea and fatigue, etc. patient desires to achieved higher level of iron faster for adequate hematopoesis and agrees with IV Venofer. Plan IV venofer x 5 Her hemoglobin is low at 7.2, patient is symptomatic.  Recommend 1 unit of PRBC transfusion.   Anemia in chronic kidney disease (CKD) anemia secondary to chronic kidney disease, rule out other etiologies. Check multiple myeloma panel, light chain ratio.  Check B12, folate.  DAT, LDH,  Orders Placed This Encounter  Procedures   CBC with Differential/Platelet    Standing Status:   Future    Number of Occurrences:   1    Standing Expiration Date:   06/27/2023   Retic Panel    Standing Status:   Future    Number of Occurrences:   1    Standing Expiration Date:   06/27/2023   Multiple Myeloma Panel (SPEP&IFE w/QIG)    Standing Status:   Future    Number of Occurrences:   1    Standing Expiration Date:   06/27/2023   Kappa/lambda light chains    Standing  Status:   Future    Number of Occurrences:   1    Standing Expiration Date:   06/27/2023   Folate    Standing Status:   Future    Number of Occurrences:   1    Standing Expiration Date:   06/27/2023   Vitamin B12    Standing Status:   Future    Number of Occurrences:   1    Standing Expiration Date:   06/27/2023   Ferritin    Standing Status:   Future    Number of Occurrences:   1    Standing Expiration Date:   12/27/2022   Iron and TIBC    Standing Status:   Future    Number of Occurrences:   1    Standing Expiration Date:   06/27/2023   Lactate dehydrogenase    Standing Status:   Future    Number of Occurrences:   1    Standing Expiration Date:   06/27/2023   Technologist smear review    Standing Status:   Future    Number of Occurrences:   1    Standing Expiration Date:   06/27/2023    Order Specific Question:   Clinical information:    Answer:   anemia   Basic metabolic panel    Standing Status:   Future    Number of Occurrences:   1    Standing Expiration Date:   06/26/2023   Hepatic function panel    Standing Status:   Future    Number of Occurrences:  1    Standing Expiration Date:   06/26/2023   Hold Tube- Blood Bank    Standing Status:   Future    Number of Occurrences:   1    Standing Expiration Date:   06/27/2023   DAT, polyspecific, AHG ([place] only)    Standing Status:   Future    Number of Occurrences:   1    Standing Expiration Date:   06/27/2023    All questions were answered. The patient knows to call the clinic with any problems, questions or concerns.  Earlie Server, MD, PhD [facility] Hematology Oncology 06/26/2022     HISTORY OF PRESENTING ILLNESS:  Jasmine Webster is a  88 y.o.  female with PMH listed below who was referred to me for anemia Reviewed patient's recent labs that was done.   Reviewed patient's previous labs ordered by primary care physician's office, anemia is chronic onset , duration is since January 2023. Patient reports very fatigued, lightheaded,  shortness of breath with minimal exertion. She denies recent chest pain on exertion, pre-syncopal episodes, or palpitations She had not noticed any recent bleeding such as epistaxis, hematuria or hematochezia.  +Dark stool She denies over the counter NSAID ingestion. She is on antiplatelets agent-aspirin 81 mg. She denies any pica and eats a variety of diet.  She was hospitalized from 05/21/2022 - 05/24/2022, due to NSTEMI and acute CHF exacerbation, symptomatic anemia, status post 2 unit of PRBC transfusion.  Patient reports feeling better after blood transfusion. Cardiology recommends no invasive intervention during this admission.  There is plan for possible TAVR  MEDICAL HISTORY:  Past Medical History:  Diagnosis Date   Arthritis    Cataract    cataract removal bilaterally approx 20 years ago   Chronic back pain    Coronary artery disease    COVID-19 virus infection 12/2020   Diabetes mellitus without complication (HCC)    Heart murmur    Hyperlipidemia    Hypertension     SURGICAL HISTORY: Past Surgical History:  Procedure Laterality Date   ABDOMINAL HYSTERECTOMY     APPENDECTOMY     BACK SURGERY     three   CARDIAC CATHETERIZATION     6+yrs no stents Dr. Verneda Skill   CATARACT EXTRACTION     RIGHT/LEFT HEART CATH AND CORONARY ANGIOGRAPHY N/A 04/26/2022   Procedure: RIGHT/LEFT HEART CATH AND CORONARY ANGIOGRAPHY;  Surgeon: Wellington Hampshire, MD;  Location: [facility];  Service: Cardiovascular;  Laterality: N/A;   THORACOLUMBAR SYRINGO SHUNT     TONSILLECTOMY AND ADENOIDECTOMY      SOCIAL HISTORY: Social History   Socioeconomic History   Marital status: Widowed    Spouse name: Not on file   Number of children: 2   Years of education: Not on file   Highest education level: 8th grade  Occupational History   Occupation: retired  Tobacco Use   Smoking status: Never   Smokeless tobacco: Never  Vaping Use   Vaping Use: Never used  Substance and Sexual  Activity   Alcohol use: No    Alcohol/week: 0.0 standard drinks of alcohol   Drug use: No   Sexual activity: Not Currently  Other Topics Concern   Not on file  Social History Narrative   Lives with daughter, Katherina Right   Social Determinants of Health   Financial Resource Strain: Low Risk  (07/12/2020)   Overall Financial Resource Strain (CARDIA)    Difficulty of Paying Living Expenses: Not hard  at all  Food Insecurity: No Food Insecurity (07/12/2020)   Hunger Vital Sign    Worried About Running Out of Food in the Last Year: Never true    Ran Out of Food in the Last Year: Never true  Transportation Needs: No Transportation Needs (07/12/2020)   PRAPARE - Hydrologist (Medical): No    Lack of Transportation (Non-Medical): No  Physical Activity: Inactive (07/12/2020)   Exercise Vital Sign    Days of Exercise per Week: 0 days    Minutes of Exercise per Session: 0 min  Stress: No Stress Concern Present (07/12/2020)   [place]    Feeling of Stress : Not at all  Social Connections: Moderately Isolated (07/12/2020)   Social Connection and Isolation Panel [NHANES]    Frequency of Communication with Friends and Family: More than three times a week    Frequency of Social Gatherings with Friends and Family: More than three times a week    Attends Religious Services: More than 4 times per year    Active Member of [employer] or Organizations: No    Attends Archivist Meetings: Never    Marital Status: Widowed  Intimate Partner Violence: Not At Risk (07/12/2020)   Humiliation, Afraid, Rape, and Kick questionnaire    Fear of Current or Ex-Partner: No    Emotionally Abused: No    Physically Abused: No    Sexually Abused: No    FAMILY HISTORY: Family History  Problem Relation Age of Onset   Heart disease Mother        died from MI   Heart attack Mother    Heart disease Sister    Sudden  death Brother        shot and beaten to death during home invasion   Stroke Sister    Sudden death Sister        MVA   Cancer Brother        died from lung cancer   Sudden death Sister        31 days old   Cancer Brother        died from lung cancer   Stroke Brother        cause of death   Diabetes Father    Stroke Father    Pneumonia Father     ALLERGIES:  is allergic to etodolac, naproxen, statins, latex, terbinafine, and terbinafine hcl.  MEDICATIONS:  Current Outpatient Medications  Medication Sig Dispense Refill   ALPRAZolam (XANAX) 0.5 MG tablet TAKE 1 TABLET BY MOUTH EVERY DAY AT BEDTIME AS NEEDED 30 tablet 0   Artificial Tear Ointment (DRY EYES OP) Apply 1 drop to eye daily.     aspirin EC 81 MG tablet Take 81 mg by mouth daily.     co-enzyme Q-10 30 MG capsule Take 30 mg by mouth daily.     ezetimibe (ZETIA) 10 MG tablet TAKE 1 TABLET BY MOUTH EVERY DAY 30 tablet 11   furosemide (LASIX) 20 MG tablet Take 1 tablet (20 mg total) by mouth every other day. 30 tablet 0   lisinopril (ZESTRIL) 10 MG tablet TAKE 1 TABLET BY MOUTH EVERY DAY 30 tablet 11   Magnesium 400 MG CAPS Take 400 mg by mouth daily.     Multiple Vitamins-Minerals (PRESERVISION AREDS) CAPS Take 1 capsule by mouth 2 (two) times daily.      nitroGLYCERIN (NITROSTAT) 0.4 MG SL tablet Place 1  tablet (0.4 mg total) under the tongue every 5 (five) minutes as needed for chest pain. 25 tablet 3   Omega-3 Fatty Acids (FISH OIL PO) Take by mouth daily.     omeprazole (PRILOSEC) 40 MG capsule TAKE 1 CAPSULE (40 MG TOTAL) BY MOUTH DAILY. 30 capsule 11   ondansetron (ZOFRAN) 4 MG tablet TAKE 1 TABLET BY MOUTH EVERY 8 HOURS AS NEEDED FOR NAUSEA AND VOMITING 20 tablet 1   pantoprazole (PROTONIX) 40 MG tablet Take 1 tablet (40 mg total) by mouth 2 (two) times daily before a meal. 60 tablet 1   potassium chloride (KLOR-CON) 10 MEQ tablet TAKE 1 TABLET BY MOUTH 2-3 TIMES A WEEK WITH FUROSEMIDE AS DIRECTED     SEMGLEE, YFGN,  100 UNIT/ML Pen 18 Units daily.     Turmeric 500 MG CAPS Take by mouth daily.     vitamin B-12 (CYANOCOBALAMIN) 500 MCG tablet Take 500 mcg by mouth once a week.     LANTUS SOLOSTAR 100 UNIT/ML Solostar Pen INJECT 25 UNITS SUBCUTANEOUSLY (Patient not taking: Reported on 06/04/2022) 45 mL 0   No current facility-administered medications for this visit.    Review of Systems  Constitutional:  Positive for fatigue. Negative for chills and fever.  HENT:   Negative for hearing loss and voice change.   Eyes:  Negative for eye problems.  Respiratory:  Positive for shortness of breath. Negative for chest tightness and cough.   Cardiovascular:  Negative for chest pain.  Gastrointestinal:  Negative for abdominal distention, abdominal pain and blood in stool.  Endocrine: Negative for hot flashes.  Genitourinary:  Negative for difficulty urinating and frequency.   Musculoskeletal:  Negative for arthralgias.  Skin:  Negative for itching and rash.  Neurological:  Positive for light-headedness. Negative for extremity weakness.  Hematological:  Negative for adenopathy.  Psychiatric/Behavioral:  Negative for confusion.     PHYSICAL EXAMINATION: ECOG PERFORMANCE STATUS: 2 - Symptomatic, <50% confined to bed Vitals:   06/26/22 1523  BP: 124/84  Pulse: 82  Temp: 98.4 F (36.9 C)   Filed Weights   06/26/22 1523  Weight: 149 lb (67.6 kg)    Physical Exam Constitutional:      General: She is not in acute distress.    Comments: Patient sits in a wheelchair  HENT:     Head: Normocephalic and atraumatic.  Eyes:     General: No scleral icterus. Cardiovascular:     Rate and Rhythm: Normal rate and regular rhythm.     Heart sounds: Murmur heard.  Pulmonary:     Effort: Pulmonary effort is normal. No respiratory distress.     Breath sounds: No wheezing.  Abdominal:     General: Bowel sounds are normal. There is no distension.     Palpations: Abdomen is soft.  Musculoskeletal:        General:  No deformity. Normal range of motion.     Cervical back: Normal range of motion and neck supple.     Comments: Trace edema bilaterally  Skin:    General: Skin is warm and dry.     Coloration: Skin is pale.     Findings: No erythema or rash.  Neurological:     Mental Status: She is alert and oriented to person, place, and time. Mental status is at baseline.     Cranial Nerves: No cranial nerve deficit.  Psychiatric:        Mood and Affect: Mood normal.      LABORATORY DATA:  I have reviewed the data as listed    Latest Ref Rng & Units 06/26/2022    4:27 PM 06/20/2022   12:15 PM 05/27/2022    2:59 PM  CBC  WBC 4.0 - 10.5 K/uL 6.0  6.3  6.0   Hemoglobin 12.0 - 15.0 g/dL 7.2  7.6  10.1   Hematocrit 36.0 - 46.0 % 23.8  25.2  32.7   Platelets 150 - 400 K/uL 114  PLATELET CLUMPS NOTED ON SMEAR  259       Latest Ref Rng & Units 06/26/2022    4:27 PM 06/20/2022   12:15 PM 05/27/2022    2:59 PM  CMP  Glucose 70 - 99 mg/dL 204  136  119   BUN 8 - 23 mg/dL 29  31  31    Creatinine 0.44 - 1.00 mg/dL 1.43  1.36  1.23   Sodium 135 - 145 mmol/L 133  137  137   Potassium 3.5 - 5.1 mmol/L 4.2  4.4  4.7   Chloride 98 - 111 mmol/L 100  108  105   CO2 22 - 32 mmol/L 23  22  26    Calcium 8.9 - 10.3 mg/dL 8.3  9.0    8.9  8.9   Total Protein 6.5 - 8.1 g/dL 6.2     Total Bilirubin 0.3 - 1.2 mg/dL 0.4     Alkaline Phos 38 - 126 U/L 105     AST 15 - 41 U/L 17     ALT 0 - 44 U/L 10         Component Value Date/Time   IRON 15 (L) 06/26/2022 1627   IRON 36 12/04/2021 1302   TIBC 407 06/26/2022 1627   TIBC 327 12/04/2021 1302   FERRITIN 7 (L) 06/26/2022 1627   FERRITIN 31 12/04/2021 1302   IRONPCTSAT 4 (L) 06/26/2022 1627   IRONPCTSAT 11 (L) 12/04/2021 1302     RADIOGRAPHIC STUDIES: I have personally reviewed the radiological images as listed and agreed with the findings in the report. No results found.

## 2022-06-26 NOTE — Assessment & Plan Note (Signed)
anemia secondary to chronic kidney disease, rule out other etiologies. Check multiple myeloma panel, light chain ratio.  Check B12, folate.  DAT, LDH,

## 2022-06-27 ENCOUNTER — Telehealth: Payer: Self-pay | Admitting: *Deleted

## 2022-06-27 ENCOUNTER — Other Ambulatory Visit: Payer: PPO

## 2022-06-27 ENCOUNTER — Other Ambulatory Visit: Payer: Self-pay | Admitting: Oncology

## 2022-06-27 ENCOUNTER — Telehealth: Payer: Self-pay

## 2022-06-27 ENCOUNTER — Inpatient Hospital Stay: Payer: PPO

## 2022-06-27 ENCOUNTER — Other Ambulatory Visit: Payer: Self-pay

## 2022-06-27 ENCOUNTER — Ambulatory Visit
Admission: RE | Admit: 2022-06-27 | Discharge: 2022-06-27 | Disposition: A | Discharge status: Home / Self Care | Payer: PPO | Source: Ambulatory Visit | Attending: Oncology | Admitting: Oncology

## 2022-06-27 VITALS — BP 131/43 | HR 67 | Temp 97.3°F | Resp 18 | Ht 64.5 in | Wt 148.0 lb

## 2022-06-27 DIAGNOSIS — D649 Anemia, unspecified: Secondary | ICD-10-CM

## 2022-06-27 DIAGNOSIS — E1122 Type 2 diabetes mellitus with diabetic chronic kidney disease: Secondary | ICD-10-CM | POA: Diagnosis not present

## 2022-06-27 DIAGNOSIS — N183 Chronic kidney disease, stage 3 unspecified: Secondary | ICD-10-CM | POA: Insufficient documentation

## 2022-06-27 DIAGNOSIS — R809 Proteinuria, unspecified: Secondary | ICD-10-CM | POA: Diagnosis not present

## 2022-06-27 DIAGNOSIS — I10 Essential (primary) hypertension: Secondary | ICD-10-CM | POA: Diagnosis not present

## 2022-06-27 DIAGNOSIS — D631 Anemia in chronic kidney disease: Secondary | ICD-10-CM | POA: Diagnosis not present

## 2022-06-27 DIAGNOSIS — Z01818 Encounter for other preprocedural examination: Secondary | ICD-10-CM | POA: Diagnosis not present

## 2022-06-27 DIAGNOSIS — E785 Hyperlipidemia, unspecified: Secondary | ICD-10-CM | POA: Diagnosis not present

## 2022-06-27 DIAGNOSIS — I509 Heart failure, unspecified: Secondary | ICD-10-CM | POA: Diagnosis not present

## 2022-06-27 DIAGNOSIS — N184 Chronic kidney disease, stage 4 (severe): Secondary | ICD-10-CM | POA: Diagnosis not present

## 2022-06-27 LAB — TECHNOLOGIST SMEAR REVIEW: Plt Morphology: UNDETERMINED

## 2022-06-27 LAB — KAPPA/LAMBDA LIGHT CHAINS
Kappa free light chain: 31.8 mg/L — ABNORMAL HIGH (ref 3.3–19.4)
Kappa, lambda light chain ratio: 1.27 (ref 0.26–1.65)
Lambda free light chains: 25 mg/L (ref 5.7–26.3)

## 2022-06-27 MED ORDER — ACETAMINOPHEN 325 MG PO TABS
ORAL_TABLET | ORAL | Status: AC
  Administered 2022-06-27: 650 mg via ORAL
  Filled 2022-06-27: qty 2

## 2022-06-27 MED ORDER — FUROSEMIDE 10 MG/ML IJ SOLN
INTRAMUSCULAR | Status: AC
  Administered 2022-06-27: 20 mg via INTRAVENOUS
  Filled 2022-06-27: qty 2

## 2022-06-27 MED ORDER — ACETAMINOPHEN 325 MG PO TABS: 650.0000 mg | ORAL_TABLET | Freq: Once | ORAL | Status: AC

## 2022-06-27 MED ORDER — SODIUM CHLORIDE 0.9% IV SOLUTION
250.0000 mL | Freq: Once | INTRAVENOUS | Status: AC
  Administered 2022-06-27: 250 mL via INTRAVENOUS

## 2022-06-27 MED ORDER — FUROSEMIDE 10 MG/ML IJ SOLN: 20.0000 mg | Freq: Once | INTRAMUSCULAR | Status: AC

## 2022-06-27 MED ORDER — DIPHENHYDRAMINE HCL 25 MG PO CAPS
ORAL_CAPSULE | ORAL | Status: AC
  Administered 2022-06-27: 25 mg via ORAL
  Filled 2022-06-27: qty 1

## 2022-06-27 MED ORDER — DIPHENHYDRAMINE HCL 25 MG PO CAPS: 25.0000 mg | ORAL_CAPSULE | Freq: Once | ORAL | Status: AC

## 2022-06-27 MED FILL — Iron Sucrose Inj 20 MG/ML (Fe Equiv): INTRAVENOUS | Qty: 10 | Status: AC

## 2022-06-27 NOTE — Telephone Encounter (Addendum)
Called and spoke to [place]. Informed of MD recommendation to receive blood or Venofer. There is room for blood tomorrow per Mitchell Heir, but she will need to come for labs (type and screen today).   Pt will get blood transfusion at same day surgery to[facility].   Please schedule the following appt, pt will see these on Mychart.   --Venofer x2 (at least 3 days apart) next week and the week of 8/21, then x1 the week of 8/28 (total of 5 doses). First dose is NEW.  -labs in 8 weeks with MD/ Venofer 1-2 days after labs

## 2022-06-27 NOTE — Telephone Encounter (Signed)
-----   Message from Earlie Server, MD sent at 06/26/2022 11:35 PM EDT ----- Patient's blood work showed severe iron deficiency anemia.  She is symptomatic. Recommend IV Venofer x 5, 1 unit of PRBC transfusion. Please arrange her to have either Venofer or PRBC transfusion this week Schedule Venofer twice per week, at least 3 days apart.  Follow-up in 8 weeks, labs prior to MD plus Venofer

## 2022-06-27 NOTE — Telephone Encounter (Signed)
Patient daughter called reporting that patient is awaiting a call for appointment time for tomorrow for blood transfusion. Daughter is concerned that patient cannot wait for tomorrow to get blood and is asking if there is any way she can get it today even is through ER. Patient does have an appointment with her nephrologist this morning at 1030 and her son is on his way to pick her up for that appointment and she asked if she should go to that or cancel it. I told her to go ahead and keep that appointment and I would send message to Dr Tasia Catchings. She reports that patient is very weak and is having difficulty getting around. Please advise

## 2022-06-27 NOTE — Telephone Encounter (Signed)
Please cancel blood on 8/11 since she will be getting blood at [facility] today.   Looks like she's only scheduled for 3 venofers, she needs 2 venofer infusions the week of 8/21 and 1 venofer tx the week of 8/28 to = 5 venofer infusions.

## 2022-06-27 NOTE — Telephone Encounter (Signed)
Pt arranged to get Blood transfusion at same day surgery today. Pt's daughter [name] notified.

## 2022-06-28 ENCOUNTER — Ambulatory Visit: Payer: PPO

## 2022-06-28 LAB — BPAM RBC
Blood Product Expiration Date: 202309092359
ISSUE DATE / TIME: 202308101321
Unit Type and Rh: 5100

## 2022-06-28 LAB — TYPE AND SCREEN
ABO/RH(D): O POS
Antibody Screen: NEGATIVE
Unit division: 0

## 2022-06-30 ENCOUNTER — Ambulatory Visit (INDEPENDENT_AMBULATORY_CARE_PROVIDER_SITE_OTHER): Payer: PPO

## 2022-06-30 ENCOUNTER — Ambulatory Visit
Admission: EM | Admit: 2022-06-30 | Discharge: 2022-06-30 | Disposition: A | Discharge status: Home / Self Care | Payer: PPO | Attending: Emergency Medicine | Admitting: Emergency Medicine

## 2022-06-30 DIAGNOSIS — R051 Acute cough: Secondary | ICD-10-CM

## 2022-06-30 DIAGNOSIS — R059 Cough, unspecified: Secondary | ICD-10-CM | POA: Diagnosis not present

## 2022-06-30 DIAGNOSIS — R058 Other specified cough: Secondary | ICD-10-CM

## 2022-06-30 MED ORDER — BENZONATATE 100 MG PO CAPS: 100.0000 mg | ORAL_CAPSULE | Freq: Three times a day (TID) | ORAL | 0 refills | Status: DC | PRN

## 2022-06-30 NOTE — ED Triage Notes (Signed)
Patient to Urgent Care with daughter. Reports productive cough x1 week, clear sputum. Denies any known fevers. States that the last time she had these symptoms she was diagnosed with pneumonia. Reports fatigue when walking  but denies SHOB/ CP.

## 2022-06-30 NOTE — Discharge Instructions (Addendum)
Take the [facility] as needed for cough.  Follow up with your primary care provider if your symptoms are not improving.

## 2022-06-30 NOTE — ED Provider Notes (Signed)
[facility]    CSN: [id number] Arrival date & time: 06/30/22  1159      History   Chief Complaint Chief Complaint  Patient presents with   Cough    HPI Jasmine Webster is a 88 y.o. female.  Accompanied by her daughter, patient presents with cough productive of clear sputum x1 week.  She is concerned for the possibility of pneumonia.  No fever, chills, chest pain, unusual shortness of breath, edema, or other symptoms.  Her medical history includes diabetes, hypertension, heart failure, pulmonary hypertension, aortic valve stenosis, NSTEMI.  The history is provided by the patient, a relative and medical records.    Past Medical History:  Diagnosis Date   Arthritis    Cataract    cataract removal bilaterally approx 20 years ago   Chronic back pain    Coronary artery disease    COVID-19 virus infection 12/2020   Diabetes mellitus without complication ([place])    Heart murmur    Hyperlipidemia    Hypertension     Patient Active Problem List   Diagnosis Date Noted   Normocytic anemia 06/26/2022   Iron deficiency anemia 06/26/2022   Anemia in chronic kidney disease (CKD) 06/26/2022   Symptomatic anemia 05/22/2022   Moderate aortic valve stenosis 05/22/2022   Severe aortic stenosis    Acute CHF (congestive heart failure) ([place]) 05/21/2022   NSTEMI (non-ST elevated myocardial infarction) ([place]) 05/21/2022   GERD without esophagitis 05/21/2022   Type 2 diabetes mellitus without complications ([place]) 71/69/6789   Dyslipidemia 05/21/2022   Aortic valve stenosis    Pulmonary hypertension (HCC)    Type 2 diabetes mellitus with diabetic chronic kidney disease ([place]) 02/20/2022   Heart failure, unspecified ([place]) 02/20/2022   Chronic kidney disease, stage 4 (severe) ([place]) 02/20/2022   Anemia in chronic kidney disease 02/20/2022   Weakness    Bronchitis    Hyponatremia 11/26/2021   Hypertension    Coronary artery disease    COVID-19 virus infection 12/2020   Diabetes  mellitus without complication (HCC) 38/08/1750   L-S radiculopathy 01/15/2019   Angina pectoris ([place]) 12/28/2018   Mixed hyperlipidemia 12/28/2018   Arthritis 03/23/2015   Cardiovascular disease 03/23/2015   Urinary system disease 03/23/2015   Malignant neoplasm of corpus uteri ([place]) 03/23/2015   Benign essential HTN 03/23/2015   Acid reflux 03/23/2015   Cardiac murmur 03/23/2015   Hypercholesteremia 03/23/2015   Adult hypothyroidism 03/23/2015   Cannot sleep 03/23/2015   Malaise and fatigue 03/23/2015   Muscle ache 03/23/2015   Arthritis, degenerative 03/23/2015    Past Surgical History:  Procedure Laterality Date   ABDOMINAL HYSTERECTOMY     APPENDECTOMY     BACK SURGERY     three   CARDIAC CATHETERIZATION     6+yrs no stents Dr. Verneda Skill   CATARACT EXTRACTION     RIGHT/LEFT HEART CATH AND CORONARY ANGIOGRAPHY N/A 04/26/2022   Procedure: RIGHT/LEFT HEART CATH AND CORONARY ANGIOGRAPHY;  Surgeon: Wellington Hampshire, MD;  Location: [facility];  Service: Cardiovascular;  Laterality: N/A;   THORACOLUMBAR [place] AND ADENOIDECTOMY      OB History   No obstetric history on file.      Home Medications    Prior to Admission medications   Medication Sig Start Date End Date Taking? Authorizing Provider  benzonatate (TESSALON) 100 MG capsule Take 1 capsule (100 mg total) by mouth 3 (three) times daily as needed for cough. 06/30/22  Yes Sharion Balloon, NP  ALPRAZolam Duanne Moron) 0.5 MG tablet TAKE 1 TABLET BY MOUTH EVERY DAY AT BEDTIME AS NEEDED 06/17/22   Mikey Kirschner, PA-C  Artificial Tear Ointment (DRY EYES OP) Apply 1 drop to eye daily.    [provider]  aspirin EC 81 MG tablet Take 81 mg by mouth daily.    [provider]  co-enzyme Q-10 30 MG capsule Take 30 mg by mouth daily.    [provider]  ezetimibe (ZETIA) 10 MG tablet TAKE 1 TABLET BY MOUTH EVERY DAY 06/19/22   Jerrol Banana., MD  furosemide  (LASIX) 20 MG tablet Take 1 tablet (20 mg total) by mouth every other day. 05/26/22   Emeterio Reeve, DO  LANTUS SOLOSTAR 100 UNIT/ML Solostar Pen INJECT 25 UNITS SUBCUTANEOUSLY Patient not taking: Reported on 06/04/2022 03/07/22   Jerrol Banana., MD  lisinopril (ZESTRIL) 10 MG tablet TAKE 1 TABLET BY MOUTH EVERY DAY 06/19/22   Jerrol Banana., MD  Magnesium 400 MG CAPS Take 400 mg by mouth daily.    [provider]  Multiple Vitamins-Minerals (PRESERVISION AREDS) CAPS Take 1 capsule by mouth 2 (two) times daily.     [provider]  nitroGLYCERIN (NITROSTAT) 0.4 MG SL tablet Place 1 tablet (0.4 mg total) under the tongue every 5 (five) minutes as needed for chest pain. 07/02/21   Minna Merritts, MD  Omega-3 Fatty Acids (FISH OIL PO) Take by mouth daily.    [provider]  omeprazole (PRILOSEC) 40 MG capsule TAKE 1 CAPSULE (40 MG TOTAL) BY MOUTH DAILY. 06/19/22   Jerrol Banana., MD  ondansetron (ZOFRAN) 4 MG tablet TAKE 1 TABLET BY MOUTH EVERY 8 HOURS AS NEEDED FOR NAUSEA AND VOMITING 04/22/22   Minna Merritts, MD  pantoprazole (PROTONIX) 40 MG tablet Take 1 tablet (40 mg total) by mouth 2 (two) times daily before a meal. 11/28/21   Fritzi Mandes, MD  potassium chloride (KLOR-CON) 10 MEQ tablet TAKE 1 TABLET BY MOUTH 2-3 TIMES A WEEK WITH FUROSEMIDE AS DIRECTED 02/02/22   [provider]  SEMGLEE, YFGN, 100 UNIT/ML Pen 18 Units daily. 05/09/22   [provider]  Turmeric 500 MG CAPS Take by mouth daily.    [provider]  vitamin B-12 (CYANOCOBALAMIN) 500 MCG tablet Take 500 mcg by mouth once a week.    [provider]    Family History Family History  Problem Relation Age of Onset   Heart disease Mother        died from MI   Heart attack Mother    Heart disease Sister    Sudden death Brother        shot and beaten to death during home invasion   Stroke Sister    Sudden death Sister        MVA   Cancer  Brother        died from lung cancer   Sudden death Sister        72 days old   Cancer Brother        died from lung cancer   Stroke Brother        cause of death   Diabetes Father    Stroke Father    Pneumonia Father     Social History Social History   Tobacco Use   Smoking status: Never   Smokeless tobacco: Never  Vaping Use   Vaping Use: Never used  Substance Use Topics   Alcohol use: No    Alcohol/week: 0.0 standard drinks of alcohol   Drug use: No     Allergies   Etodolac, Naproxen, Statins, Latex, Terbinafine, and Terbinafine hcl   Review of Systems Review of Systems  Constitutional:  Negative for chills and fever.  HENT:  Negative for ear pain and sore throat.   Respiratory:  Positive for cough. Negative for shortness of breath.   Cardiovascular:  Negative for chest pain and palpitations.  All other systems reviewed and are negative.    Physical Exam Triage Vital Signs ED Triage Vitals  Enc Vitals Group     BP      Pulse      Resp      Temp      Temp src      SpO2      Weight      Height      Head Circumference      Peak Flow      Pain Score      Pain Loc      Pain Edu?      Excl. in [place]?    No data found.  Updated Vital Signs BP 130/64   Pulse 78   Temp 98.1 F (36.7 C)   Resp 18   Ht 5' 4.5" (1.638 m)   Wt 149 lb (67.6 kg)   SpO2 97%   BMI 25.18 kg/m   Visual Acuity Right Eye Distance:   Left Eye Distance:   Bilateral Distance:    Right Eye Near:   Left Eye Near:    Bilateral Near:     Physical Exam Vitals and nursing note reviewed.  Constitutional:      General: She is not in acute distress.    Appearance: Normal appearance. She is well-developed. She is not ill-appearing.  HENT:     Mouth/Throat:     Mouth: Mucous membranes are moist.  Cardiovascular:     Rate and Rhythm: Normal rate and regular rhythm.     Heart sounds: Normal heart sounds.  Pulmonary:     Effort: Pulmonary effort is normal. No respiratory  distress.     Breath sounds: Normal breath sounds. No wheezing, rhonchi or rales.  Musculoskeletal:     Cervical back: Neck supple.     Right lower leg: No edema.     Left lower leg: No edema.  Skin:    General: Skin is warm and dry.  Neurological:     Mental Status: She is alert.  Psychiatric:        Mood and Affect: Mood normal.        Behavior: Behavior normal.      UC Treatments / Results  Labs (all labs ordered are listed, but only abnormal results are displayed) Labs Reviewed - No data to display  EKG   Radiology DG Chest 2 View  Result Date: 06/30/2022 CLINICAL DATA:  88 year old female with cough. EXAM: CHEST - 2 VIEW COMPARISON:  Portable chest 05/21/2022 and earlier. FINDINGS: PA and lateral views at 1213 hours. Partially visible chronic thoracolumbar fusion hardware. Prior cervical ACDF also. Borderline to mild cardiomegaly. Calcified aortic atherosclerosis. Other mediastinal contours are within normal limits. Visualized tracheal air column is within normal limits. Mild chronic increased pulmonary interstitial markings appear stable from earlier this year. No pneumothorax, pulmonary edema, pleural effusion or confluent pulmonary opacity. Osteopenia. No acute osseous abnormality identified. Negative visible bowel gas. IMPRESSION: No acute cardiopulmonary abnormality. Mild  cardiomegaly.  Aortic Atherosclerosis (ICD10-I70.0). Electronically Signed   By: Genevie Ann M.D.   On: 06/30/2022 12:31    Procedures Procedures (including critical care time)  Medications Ordered in UC Medications - No data to display  Initial Impression / Assessment and Plan / UC Course  I have reviewed the triage vital signs and the nursing notes.  Pertinent labs & imaging results that were available during my care of the patient were reviewed by me and considered in my medical decision making (see chart for details).    Productive cough.  Patient is well-appearing and her exam is reassuring.   Chest x-ray negative.  Lungs are clear and O2 sat is 97% on room air.  Treating cough with Tessalon Perles.  Instructed patient to follow-up with her PCP if her symptoms are not improving.  She agrees to plan of care.  Final Clinical Impressions(s) / UC Diagnoses   Final diagnoses:  Productive cough     Discharge Instructions      Take the Tessalon Perles as needed for cough.  Follow up with your primary care provider if your symptoms are not improving.        ED Prescriptions     Medication Sig Dispense Auth. Provider   benzonatate (TESSALON) 100 MG capsule Take 1 capsule (100 mg total) by mouth 3 (three) times daily as needed for cough. 21 capsule Sharion Balloon, NP      PDMP not reviewed this encounter.   Sharion Balloon, NP 06/30/22 1310

## 2022-07-01 ENCOUNTER — Inpatient Hospital Stay: Payer: PPO

## 2022-07-01 VITALS — BP 131/51 | HR 80 | Temp 97.9°F | Resp 16

## 2022-07-01 DIAGNOSIS — D500 Iron deficiency anemia secondary to blood loss (chronic): Secondary | ICD-10-CM

## 2022-07-01 DIAGNOSIS — D509 Iron deficiency anemia, unspecified: Secondary | ICD-10-CM | POA: Diagnosis not present

## 2022-07-01 LAB — MULTIPLE MYELOMA PANEL, SERUM
Albumin SerPl Elph-Mcnc: 3.2 g/dL (ref 2.9–4.4)
Albumin/Glob SerPl: 1.3 (ref 0.7–1.7)
Alpha 1: 0.2 g/dL (ref 0.0–0.4)
Alpha2 Glob SerPl Elph-Mcnc: 0.8 g/dL (ref 0.4–1.0)
B-Globulin SerPl Elph-Mcnc: 1 g/dL (ref 0.7–1.3)
Gamma Glob SerPl Elph-Mcnc: 0.5 g/dL (ref 0.4–1.8)
Globulin, Total: 2.6 g/dL (ref 2.2–3.9)
IgA: 138 mg/dL (ref 64–422)
IgG (Immunoglobin G), Serum: 463 mg/dL — ABNORMAL LOW (ref 586–1602)
IgM (Immunoglobulin M), Srm: 133 mg/dL (ref 26–217)
Total Protein ELP: 5.8 g/dL — ABNORMAL LOW (ref 6.0–8.5)

## 2022-07-01 MED ORDER — SODIUM CHLORIDE 0.9 % IV SOLN
Freq: Once | INTRAVENOUS | Status: AC
  Filled 2022-07-01: qty 250

## 2022-07-01 MED ORDER — SODIUM CHLORIDE 0.9 % IV SOLN
200.0000 mg | Freq: Once | INTRAVENOUS | Status: AC
  Administered 2022-07-01: 200 mg via INTRAVENOUS
  Filled 2022-07-01: qty 200

## 2022-07-01 NOTE — Patient Instructions (Signed)

## 2022-07-02 LAB — PREPARE RBC (CROSSMATCH)

## 2022-07-03 ENCOUNTER — Ambulatory Visit: Payer: Self-pay | Admitting: *Deleted

## 2022-07-03 ENCOUNTER — Encounter: Payer: PPO | Admitting: Surgery

## 2022-07-03 NOTE — Patient Instructions (Signed)
Visit Information  Thank you for taking time to visit with me today. Please don't hesitate to contact me if I can be of assistance to you.   Following are the goals we discussed today:   Goals Addressed             This Visit's Progress    care coordination activities       Care Coordination Interventions:   Needs assessment/SDOH screening completed Case Management program discussed Confirmed next PCP appointment 08/08/22 Patient encouraged to schedule AWV Patient verbalized having no nursing or community resource needs at this time         Please call the care guide team at [phone] if you need to cancel or reschedule your appointment.   If you are experiencing a Mental Health or [place] or need someone to talk to, please call the Suicide and Crisis Lifeline: 988   Patient verbalizes understanding of instructions and care plan provided today and agrees to view in [place]. Active MyChart status and patient understanding of how to access instructions and care plan via MyChart confirmed with patient.     Next PCP appointment scheduled for: 08/08/22  Elliot Gurney, [place] Worker  [facility] Care Management [phone]

## 2022-07-03 NOTE — Patient Outreach (Signed)
  Care Coordination   Initial Visit Note   07/03/2022 Name: Jasmine Webster MRN: [id number] DOB: 1926/02/11  Jasmine Webster is a 88 y.o. year old female who sees Jerrol Banana., MD for primary care. I spoke with  [name] by phone today  What matters to the patients health and wellness today?  Patient verbalized having no nursing or community resource needs at this time    Goals Addressed             This Visit's Progress    care coordination activities       Care Coordination Interventions:   Needs assessment/SDOH screening completed Case Management program discussed Confirmed next PCP appointment 08/08/22 Patient encouraged to schedule AWV Patient verbalized having no nursing or community resource needs at this time        SDOH assessments and interventions completed:  No  SDOH Interventions Today    Flowsheet Row Most Recent Value  SDOH Interventions   Food Insecurity Interventions Intervention Not Indicated  Financial Strain Interventions Intervention Not Indicated  Housing Interventions Intervention Not Indicated   Social Connections Interventions Intervention Not Indicated  Transportation Interventions Intervention Not Indicated        Care Coordination Interventions Activated:  No  Care Coordination Interventions:  No, not indicated   Follow up plan: No further intervention required.   Encounter Outcome:  Pt. Visit Completed

## 2022-07-05 ENCOUNTER — Inpatient Hospital Stay: Payer: PPO

## 2022-07-05 VITALS — BP 140/44 | HR 63 | Temp 97.2°F | Resp 16

## 2022-07-05 DIAGNOSIS — D500 Iron deficiency anemia secondary to blood loss (chronic): Secondary | ICD-10-CM

## 2022-07-05 DIAGNOSIS — D509 Iron deficiency anemia, unspecified: Secondary | ICD-10-CM | POA: Diagnosis not present

## 2022-07-05 MED ORDER — SODIUM CHLORIDE 0.9 % IV SOLN
200.0000 mg | Freq: Once | INTRAVENOUS | Status: AC
  Administered 2022-07-05: 200 mg via INTRAVENOUS
  Filled 2022-07-05: qty 200

## 2022-07-05 MED ORDER — SODIUM CHLORIDE 0.9 % IV SOLN
Freq: Once | INTRAVENOUS | Status: AC
  Filled 2022-07-05: qty 250

## 2022-07-05 NOTE — Patient Instructions (Signed)

## 2022-07-08 ENCOUNTER — Ambulatory Visit: Payer: PPO | Admitting: Physician Assistant

## 2022-07-08 ENCOUNTER — Ambulatory Visit (INDEPENDENT_AMBULATORY_CARE_PROVIDER_SITE_OTHER): Payer: PPO | Admitting: Family Medicine

## 2022-07-08 ENCOUNTER — Ambulatory Visit: Payer: PPO | Admitting: Family

## 2022-07-08 VITALS — BP 134/58 | HR 80 | Temp 98.3°F

## 2022-07-08 DIAGNOSIS — I25118 Atherosclerotic heart disease of native coronary artery with other forms of angina pectoris: Secondary | ICD-10-CM | POA: Diagnosis not present

## 2022-07-08 DIAGNOSIS — I350 Nonrheumatic aortic (valve) stenosis: Secondary | ICD-10-CM

## 2022-07-08 DIAGNOSIS — D649 Anemia, unspecified: Secondary | ICD-10-CM | POA: Diagnosis not present

## 2022-07-08 DIAGNOSIS — R051 Acute cough: Secondary | ICD-10-CM

## 2022-07-08 DIAGNOSIS — N1832 Chronic kidney disease, stage 3b: Secondary | ICD-10-CM | POA: Diagnosis not present

## 2022-07-08 DIAGNOSIS — Z794 Long term (current) use of insulin: Secondary | ICD-10-CM

## 2022-07-08 DIAGNOSIS — J189 Pneumonia, unspecified organism: Secondary | ICD-10-CM | POA: Diagnosis not present

## 2022-07-08 DIAGNOSIS — E1122 Type 2 diabetes mellitus with diabetic chronic kidney disease: Secondary | ICD-10-CM | POA: Diagnosis not present

## 2022-07-08 MED ORDER — PREDNISONE 20 MG PO TABS: 20.0000 mg | ORAL_TABLET | Freq: Every day | ORAL | 0 refills | Status: DC

## 2022-07-08 MED ORDER — DOXYCYCLINE HYCLATE 100 MG PO TABS: 100.0000 mg | ORAL_TABLET | Freq: Two times a day (BID) | ORAL | 0 refills | Status: DC

## 2022-07-08 NOTE — Progress Notes (Unsigned)
Established patient visit   Patient: Jasmine Webster   DOB: 04-01-1926   88 y.o. Female  MRN: [id number] Visit Date: 07/08/2022  Today's healthcare provider: [name], MD   No chief complaint on file.  Subjective    HPI  Patient is a 88 year old who presents for evaluation of cough of 2 weeks.  No chest pain PND orthopnea. No fever or chills. Medications: Outpatient Medications Prior to Visit  Medication Sig   ALPRAZolam (XANAX) 0.5 MG tablet TAKE 1 TABLET BY MOUTH EVERY DAY AT BEDTIME AS NEEDED   Artificial Tear Ointment (DRY EYES OP) Apply 1 drop to eye daily.   aspirin EC 81 MG tablet Take 81 mg by mouth daily.   benzonatate (TESSALON) 100 MG capsule Take 1 capsule (100 mg total) by mouth 3 (three) times daily as needed for cough.   co-enzyme Q-10 30 MG capsule Take 30 mg by mouth daily.   ezetimibe (ZETIA) 10 MG tablet TAKE 1 TABLET BY MOUTH EVERY DAY   furosemide (LASIX) 20 MG tablet Take 1 tablet (20 mg total) by mouth every other day.   LANTUS SOLOSTAR 100 UNIT/ML Solostar Pen INJECT 25 UNITS SUBCUTANEOUSLY   lisinopril (ZESTRIL) 10 MG tablet TAKE 1 TABLET BY MOUTH EVERY DAY   Magnesium 400 MG CAPS Take 400 mg by mouth daily.   Multiple Vitamins-Minerals (PRESERVISION AREDS) CAPS Take 1 capsule by mouth 2 (two) times daily.    nitroGLYCERIN (NITROSTAT) 0.4 MG SL tablet Place 1 tablet (0.4 mg total) under the tongue every 5 (five) minutes as needed for chest pain.   Omega-3 Fatty Acids (FISH OIL PO) Take by mouth daily.   omeprazole (PRILOSEC) 40 MG capsule TAKE 1 CAPSULE (40 MG TOTAL) BY MOUTH DAILY.   ondansetron (ZOFRAN) 4 MG tablet TAKE 1 TABLET BY MOUTH EVERY 8 HOURS AS NEEDED FOR NAUSEA AND VOMITING   pantoprazole (PROTONIX) 40 MG tablet Take 1 tablet (40 mg total) by mouth 2 (two) times daily before a meal.   potassium chloride (KLOR-CON) 10 MEQ tablet TAKE 1 TABLET BY MOUTH 2-3 TIMES A WEEK WITH FUROSEMIDE AS DIRECTED   SEMGLEE, YFGN, 100 UNIT/ML Pen  18 Units daily.   Turmeric 500 MG CAPS Take by mouth daily.   vitamin B-12 (CYANOCOBALAMIN) 500 MCG tablet Take 500 mcg by mouth once a week.   No facility-administered medications prior to visit.    Review of Systems  HENT:  Positive for rhinorrhea and sneezing. Negative for congestion, ear discharge, ear pain, postnasal drip, sinus pressure, sinus pain, sore throat and tinnitus.   Respiratory:  Positive for cough (productive of clear). Negative for shortness of breath and wheezing.   Cardiovascular:  Negative for chest pain.    Last hemoglobin A1c Lab Results  Component Value Date   HGBA1C 7.4 (A) 04/11/2022       Objective    BP (!) 134/58 (BP Location: Right Arm, Patient Position: Sitting, Cuff Size: Normal)   Pulse 80   Temp 98.3 F (36.8 C) (Oral)   SpO2 99%   Physical Exam Vitals reviewed.  Constitutional:      General: She is not in acute distress.    Appearance: She is well-developed.  HENT:     Head: Normocephalic and atraumatic.     Right Ear: Hearing normal.     Left Ear: Hearing normal.     Nose: Nose normal.  Eyes:     General: Lids are normal. No scleral  icterus.       Right eye: No discharge.        Left eye: No discharge.     Conjunctiva/sclera: Conjunctivae normal.  Cardiovascular:     Rate and Rhythm: Normal rate and regular rhythm.     Heart sounds: Normal heart sounds.  Pulmonary:     Effort: Pulmonary effort is normal. No respiratory distress.     Breath sounds: Normal breath sounds.     Comments: Very mild occasional expiratory wheeze Skin:    Findings: No lesion or rash.  Neurological:     General: No focal deficit present.     Mental Status: She is alert and oriented to person, place, and time.  Psychiatric:        Mood and Affect: Mood normal.        Speech: Speech normal.        Behavior: Behavior normal.        Thought Content: Thought content normal.        Judgment: Judgment normal.       No results found for any visits on  07/08/22.  Assessment & Plan     1. Walking pneumonia Doxycycline and prednisone for 5 days.  2. Acute cough If cough is not better after the treatment then we will stop her lisinopril and see how she does  3. Low hemoglobin and low hematocrit/anemia   4. Type 2 diabetes mellitus with stage 3b chronic kidney disease, with long-term current use of insulin ([place])   5. Coronary artery disease of native artery of native heart with stable angina pectoris ([place])   6. Severe aortic stenosis    No follow-ups on file.      I, [name], MD, have reviewed all documentation for this visit. The documentation on 07/10/22 for the exam, diagnosis, procedures, and orders are all accurate and complete.    Josue Falconi Cranford Mon, MD  [facility] [phone] (phone) [phone] (fax)  [place]

## 2022-07-09 ENCOUNTER — Inpatient Hospital Stay: Payer: PPO

## 2022-07-09 VITALS — BP 146/54 | HR 91 | Temp 99.1°F

## 2022-07-09 DIAGNOSIS — D500 Iron deficiency anemia secondary to blood loss (chronic): Secondary | ICD-10-CM

## 2022-07-09 DIAGNOSIS — D509 Iron deficiency anemia, unspecified: Secondary | ICD-10-CM | POA: Diagnosis not present

## 2022-07-09 MED ORDER — SODIUM CHLORIDE 0.9 % IV SOLN
Freq: Once | INTRAVENOUS | Status: AC
  Filled 2022-07-09: qty 250

## 2022-07-09 MED ORDER — SODIUM CHLORIDE 0.9 % IV SOLN
200.0000 mg | Freq: Once | INTRAVENOUS | Status: AC
  Administered 2022-07-09: 200 mg via INTRAVENOUS
  Filled 2022-07-09: qty 200

## 2022-07-09 NOTE — Patient Instructions (Signed)

## 2022-07-11 DIAGNOSIS — D509 Iron deficiency anemia, unspecified: Secondary | ICD-10-CM | POA: Diagnosis not present

## 2022-07-12 ENCOUNTER — Inpatient Hospital Stay: Payer: PPO

## 2022-07-12 ENCOUNTER — Telehealth: Payer: Self-pay | Admitting: Family Medicine

## 2022-07-12 VITALS — BP 144/52 | HR 87 | Temp 98.7°F | Resp 18

## 2022-07-12 DIAGNOSIS — D500 Iron deficiency anemia secondary to blood loss (chronic): Secondary | ICD-10-CM

## 2022-07-12 DIAGNOSIS — D509 Iron deficiency anemia, unspecified: Secondary | ICD-10-CM | POA: Diagnosis not present

## 2022-07-12 MED ORDER — SODIUM CHLORIDE 0.9 % IV SOLN
200.0000 mg | Freq: Once | INTRAVENOUS | Status: AC
  Administered 2022-07-12: 200 mg via INTRAVENOUS
  Filled 2022-07-12: qty 200

## 2022-07-12 MED ORDER — SODIUM CHLORIDE 0.9 % IV SOLN
Freq: Once | INTRAVENOUS | Status: AC
  Filled 2022-07-12: qty 250

## 2022-07-12 NOTE — Telephone Encounter (Signed)
Pt is calling to report that her cough has improved after taking doxycycline (VIBRA-TABS) 100 MG tablet [[id number]]

## 2022-07-15 ENCOUNTER — Inpatient Hospital Stay: Payer: PPO

## 2022-07-15 VITALS — BP 124/40 | HR 72 | Temp 97.8°F

## 2022-07-15 DIAGNOSIS — D500 Iron deficiency anemia secondary to blood loss (chronic): Secondary | ICD-10-CM

## 2022-07-15 DIAGNOSIS — D509 Iron deficiency anemia, unspecified: Secondary | ICD-10-CM | POA: Diagnosis not present

## 2022-07-15 MED ORDER — SODIUM CHLORIDE 0.9 % IV SOLN
200.0000 mg | Freq: Once | INTRAVENOUS | Status: AC
  Administered 2022-07-15: 200 mg via INTRAVENOUS
  Filled 2022-07-15: qty 200

## 2022-07-15 MED ORDER — SODIUM CHLORIDE 0.9 % IV SOLN
Freq: Once | INTRAVENOUS | Status: AC
  Filled 2022-07-15: qty 250

## 2022-07-15 NOTE — Patient Instructions (Signed)
[facility]  Discharge Instructions: Thank you for choosing [facility] to provide your oncology and hematology care.  If you have a lab appointment with the Cancer Center, please go directly to the Cancer Center and check in at the registration area.  Wear comfortable clothing and clothing appropriate for easy access to any Portacath or PICC line.   We strive to give you quality time with your provider. You may need to reschedule your appointment if you arrive late (15 or more minutes).  Arriving late affects you and other patients whose appointments are after yours.  Also, if you miss three or more appointments without notifying the office, you may be dismissed from the clinic at the provider's discretion.      For prescription refill requests, have your pharmacy contact our office and allow 72 hours for refills to be completed.    Today you received the following chemotherapy and/or immunotherapy agents VENOFER      To help prevent nausea and vomiting after your treatment, we encourage you to take your nausea medication as directed.  BELOW ARE SYMPTOMS THAT SHOULD BE REPORTED IMMEDIATELY: *FEVER GREATER THAN 100.4 F (38 C) OR HIGHER *CHILLS OR SWEATING *NAUSEA AND VOMITING THAT IS NOT CONTROLLED WITH YOUR NAUSEA MEDICATION *UNUSUAL SHORTNESS OF BREATH *UNUSUAL BRUISING OR BLEEDING *URINARY PROBLEMS (pain or burning when urinating, or frequent urination) *BOWEL PROBLEMS (unusual diarrhea, constipation, pain near the anus) TENDERNESS IN MOUTH AND THROAT WITH OR WITHOUT PRESENCE OF ULCERS (sore throat, sores in mouth, or a toothache) UNUSUAL RASH, SWELLING OR PAIN  UNUSUAL VAGINAL DISCHARGE OR ITCHING   Items with * indicate a potential emergency and should be followed up as soon as possible or go to the Emergency Department if any problems should occur.  Please show the CHEMOTHERAPY ALERT CARD or IMMUNOTHERAPY ALERT CARD at check-in to the  Emergency Department and triage nurse.  Should you have questions after your visit or need to cancel or reschedule your appointment, please contact [facility]  [phone] and follow the prompts.  Office hours are 8:00 a.m. to 4:30 p.m. Monday - Friday. Please note that voicemails left after 4:00 p.m. may not be returned until the following business day.  We are closed weekends and major holidays. You have access to a nurse at all times for urgent questions. Please call the main number to the clinic [phone] and follow the prompts.  For any non-urgent questions, you may also contact your provider using MyChart. We now offer e-Visits for anyone 18 and older to request care online for non-urgent symptoms. For details visit [email or web address].   Also download the MyChart app! Go to the app store, search "MyChart", open the app, select [facility], and log in with your MyChart username and password.  Masks are optional in the cancer centers. If you would like for your care team to wear a mask while they are taking care of you, please let them know. For doctor visits, patients may have with them one support person who is at least 86 years old. At this time, visitors are not allowed in the infusion area.  Iron Sucrose Injection What is this medication? IRON SUCROSE (EYE ern SOO krose) treats low levels of iron (iron deficiency anemia) in people with kidney disease. Iron is a mineral that plays an important role in making red blood cells, which carry oxygen from your lungs to the rest of your body. This medicine may be   used for other purposes; ask your health care provider or pharmacist if you have questions. COMMON BRAND NAME(S): Venofer What should I tell my care team before I take this medication? They need to know if you have any of these conditions: Anemia not caused by low iron levels Heart disease High levels of iron in the blood Kidney disease Liver  disease An unusual or allergic reaction to iron, other medications, foods, dyes, or preservatives Pregnant or trying to get pregnant Breast-feeding How should I use this medication? This medication is for infusion into a vein. It is given in a hospital or clinic setting. Talk to your care team about the use of this medication in children. While this medication may be prescribed for children as young as 2 years for selected conditions, precautions do apply. Overdosage: If you think you have taken too much of this medicine contact a poison control center or emergency room at once. NOTE: This medicine is only for you. Do not share this medicine with others. What if I miss a dose? It is important not to miss your dose. Call your care team if you are unable to keep an appointment. What may interact with this medication? Do not take this medication with any of the following: Deferoxamine Dimercaprol Other iron products This medication may also interact with the following: Chloramphenicol Deferasirox This list may not describe all possible interactions. Give your health care provider a list of all the medicines, herbs, non-prescription drugs, or dietary supplements you use. Also tell them if you smoke, drink alcohol, or use illegal drugs. Some items may interact with your medicine. What should I watch for while using this medication? Visit your care team regularly. Tell your care team if your symptoms do not start to get better or if they get worse. You may need blood work done while you are taking this medication. You may need to follow a special diet. Talk to your care team. Foods that contain iron include: whole grains/cereals, dried fruits, beans, or peas, leafy green vegetables, and organ meats (liver, kidney). What side effects may I notice from receiving this medication? Side effects that you should report to your care team as soon as possible: Allergic reactions--skin rash, itching, hives,  swelling of the face, lips, tongue, or throat Low blood pressure--dizziness, feeling faint or lightheaded, blurry vision Shortness of breath Side effects that usually do not require medical attention (report to your care team if they continue or are bothersome): Flushing Headache Joint pain Muscle pain Nausea Pain, redness, or irritation at injection site This list may not describe all possible side effects. Call your doctor for medical advice about side effects. You may report side effects to FDA at 1-800-FDA-1088. Where should I keep my medication? This medication is given in a hospital or clinic and will not be stored at home. NOTE: This sheet is a summary. It may not cover all possible information. If you have questions about this medicine, talk to your doctor, pharmacist, or health care provider.  2023 Elsevier/Gold Standard (2007-12-26 00:00:00)   

## 2022-07-17 DIAGNOSIS — M7062 Trochanteric bursitis, left hip: Secondary | ICD-10-CM | POA: Diagnosis not present

## 2022-07-18 ENCOUNTER — Other Ambulatory Visit: Payer: Self-pay | Admitting: Physician Assistant

## 2022-07-18 DIAGNOSIS — F419 Anxiety disorder, unspecified: Secondary | ICD-10-CM

## 2022-07-29 DIAGNOSIS — D509 Iron deficiency anemia, unspecified: Secondary | ICD-10-CM | POA: Diagnosis not present

## 2022-08-05 DIAGNOSIS — Z794 Long term (current) use of insulin: Secondary | ICD-10-CM | POA: Diagnosis not present

## 2022-08-05 DIAGNOSIS — I10 Essential (primary) hypertension: Secondary | ICD-10-CM | POA: Diagnosis not present

## 2022-08-05 DIAGNOSIS — E1159 Type 2 diabetes mellitus with other circulatory complications: Secondary | ICD-10-CM | POA: Diagnosis not present

## 2022-08-05 DIAGNOSIS — Z951 Presence of aortocoronary bypass graft: Secondary | ICD-10-CM | POA: Diagnosis not present

## 2022-08-05 DIAGNOSIS — I2510 Atherosclerotic heart disease of native coronary artery without angina pectoris: Secondary | ICD-10-CM | POA: Diagnosis not present

## 2022-08-08 ENCOUNTER — Ambulatory Visit (INDEPENDENT_AMBULATORY_CARE_PROVIDER_SITE_OTHER): Payer: PPO | Admitting: Family Medicine

## 2022-08-08 ENCOUNTER — Ambulatory Visit: Payer: PPO | Admitting: Physician Assistant

## 2022-08-08 ENCOUNTER — Encounter: Payer: Self-pay | Admitting: Family Medicine

## 2022-08-08 VITALS — BP 174/62 | HR 64 | Resp 16 | Wt 148.0 lb

## 2022-08-08 DIAGNOSIS — J189 Pneumonia, unspecified organism: Secondary | ICD-10-CM

## 2022-08-08 DIAGNOSIS — I25118 Atherosclerotic heart disease of native coronary artery with other forms of angina pectoris: Secondary | ICD-10-CM | POA: Diagnosis not present

## 2022-08-08 DIAGNOSIS — I272 Pulmonary hypertension, unspecified: Secondary | ICD-10-CM | POA: Diagnosis not present

## 2022-08-08 DIAGNOSIS — I10 Essential (primary) hypertension: Secondary | ICD-10-CM | POA: Diagnosis not present

## 2022-08-08 DIAGNOSIS — I350 Nonrheumatic aortic (valve) stenosis: Secondary | ICD-10-CM

## 2022-08-08 DIAGNOSIS — D649 Anemia, unspecified: Secondary | ICD-10-CM | POA: Diagnosis not present

## 2022-08-08 DIAGNOSIS — R531 Weakness: Secondary | ICD-10-CM | POA: Diagnosis not present

## 2022-08-08 DIAGNOSIS — E7800 Pure hypercholesterolemia, unspecified: Secondary | ICD-10-CM

## 2022-08-08 DIAGNOSIS — Z23 Encounter for immunization: Secondary | ICD-10-CM | POA: Diagnosis not present

## 2022-08-08 NOTE — Progress Notes (Signed)
Established patient visit  I,[name],acting as a scribe for [name], MD.,have documented all relevant documentation on the behalf of [name], MD,as directed by  [name], MD while in the presence of [name], MD.   Patient: Jasmine Webster   DOB: 01-23-1926   88 y.o. Female  MRN: [id number] Visit Date: 08/08/2022  Today's healthcare provider: [name], MD   Chief Complaint  Patient presents with   Follow-up   Subjective    HPI  Patient is a 88 year old female who presents for follow up of pneumonia.  She  is feeling much better. she has no complaints.  She is weaker than she used to but overall is doing okay.  No falls.  She is taking medications as prescribed. Medications: Outpatient Medications Prior to Visit  Medication Sig   ALPRAZolam (XANAX) 0.5 MG tablet TAKE 1 TABLET BY MOUTH EVERY DAY AT BEDTIME AS NEEDED   Artificial Tear Ointment (DRY EYES OP) Apply 1 drop to eye daily.   aspirin EC 81 MG tablet Take 81 mg by mouth daily.   co-enzyme Q-10 30 MG capsule Take 30 mg by mouth daily.   ezetimibe (ZETIA) 10 MG tablet TAKE 1 TABLET BY MOUTH EVERY DAY   furosemide (LASIX) 20 MG tablet Take 1 tablet (20 mg total) by mouth every other day.   LANTUS SOLOSTAR 100 UNIT/ML Solostar Pen INJECT 25 UNITS SUBCUTANEOUSLY   lisinopril (ZESTRIL) 10 MG tablet TAKE 1 TABLET BY MOUTH EVERY DAY   Magnesium 400 MG CAPS Take 400 mg by mouth daily.   Multiple Vitamins-Minerals (PRESERVISION AREDS) CAPS Take 1 capsule by mouth 2 (two) times daily.    nitroGLYCERIN (NITROSTAT) 0.4 MG SL tablet Place 1 tablet (0.4 mg total) under the tongue every 5 (five) minutes as needed for chest pain.   Omega-3 Fatty Acids (FISH OIL PO) Take by mouth daily.   omeprazole (PRILOSEC) 40 MG capsule TAKE 1 CAPSULE (40 MG TOTAL) BY MOUTH DAILY.   ondansetron (ZOFRAN) 4 MG tablet TAKE 1 TABLET BY MOUTH EVERY 8 HOURS AS NEEDED FOR NAUSEA AND VOMITING    pantoprazole (PROTONIX) 40 MG tablet Take 1 tablet (40 mg total) by mouth 2 (two) times daily before a meal.   potassium chloride (KLOR-CON) 10 MEQ tablet TAKE 1 TABLET BY MOUTH 2-3 TIMES A WEEK WITH FUROSEMIDE AS DIRECTED   SEMGLEE, YFGN, 100 UNIT/ML Pen 18 Units daily.   Turmeric 500 MG CAPS Take by mouth daily.   vitamin B-12 (CYANOCOBALAMIN) 500 MCG tablet Take 500 mcg by mouth once a week.   [DISCONTINUED] benzonatate (TESSALON) 100 MG capsule Take 1 capsule (100 mg total) by mouth 3 (three) times daily as needed for cough. (Patient not taking: Reported on 08/08/2022)   [DISCONTINUED] doxycycline (VIBRA-TABS) 100 MG tablet Take 1 tablet (100 mg total) by mouth 2 (two) times daily. (Patient not taking: Reported on 08/08/2022)   [DISCONTINUED] predniSONE (DELTASONE) 20 MG tablet Take 1 tablet (20 mg total) by mouth daily with breakfast. (Patient not taking: Reported on 08/08/2022)   No facility-administered medications prior to visit.    Review of Systems  Last hemoglobin A1c Lab Results  Component Value Date   HGBA1C 7.4 (A) 04/11/2022       Objective    BP (!) 174/62 (BP Location: Right Arm, Patient Position: Sitting, Cuff Size: Normal)   Pulse 64   Resp 16   Wt 148 lb (67.1 kg)  SpO2 99%   BMI 25.01 kg/m  BP Readings from Last 3 Encounters:  08/08/22 (!) 174/62  07/15/22 (!) 124/40  07/12/22 (!) 144/52   Wt Readings from Last 3 Encounters:  08/08/22 148 lb (67.1 kg)  06/30/22 149 lb (67.6 kg)  06/26/22 149 lb (67.6 kg)      Physical Exam Vitals reviewed.  Constitutional:      General: She is not in acute distress.    Appearance: She is well-developed.  HENT:     Head: Normocephalic and atraumatic.     Right Ear: Hearing normal.     Left Ear: Hearing normal.     Nose: Nose normal.  Eyes:     General: Lids are normal. No scleral icterus.       Right eye: No discharge.        Left eye: No discharge.     Conjunctiva/sclera: Conjunctivae normal.  Cardiovascular:      Rate and Rhythm: Normal rate and regular rhythm.     Heart sounds: Normal heart sounds.  Pulmonary:     Effort: Pulmonary effort is normal. No respiratory distress.     Breath sounds: Normal breath sounds.     Comments: Very mild occasional expiratory wheeze Skin:    Findings: No lesion or rash.  Neurological:     General: No focal deficit present.     Mental Status: She is alert and oriented to person, place, and time.  Psychiatric:        Mood and Affect: Mood normal.        Speech: Speech normal.        Behavior: Behavior normal.        Thought Content: Thought content normal.        Judgment: Judgment normal.       No results found for any visits on 08/08/22.  Assessment & Plan     1. Walking pneumonia Clinically patient has resolved this issue. Follow-up chest x-ray at appropriate time.  2. Need for influenza vaccination  - Flu Vaccine QUAD High Dose(Fluad)  3. Severe aortic stenosis By cardiology.  4. Primary hypertension Elevated systolic blood pressure today.  Consider increasing lisinopril from 10-to'20mg'$   5. Coronary artery disease of native artery of native heart with stable angina pectoris ([place]) All risk factors treated by cardiology  6. Pulmonary hypertension ([place])   7. Weakness Multi factorial.  8. Hypercholesteremia On Zetia  9. Normocytic anemia Follow-up CBC when appropriate   No follow-ups on file.      I, [name], MD, have reviewed all documentation for this visit. The documentation on 08/12/22 for the exam, diagnosis, procedures, and orders are all accurate and complete.    Kwynn Schlotter Cranford Mon, MD  [facility] [phone] (phone) [phone] (fax)  [place]

## 2022-08-16 ENCOUNTER — Other Ambulatory Visit: Payer: Self-pay | Admitting: Family Medicine

## 2022-08-16 DIAGNOSIS — E118 Type 2 diabetes mellitus with unspecified complications: Secondary | ICD-10-CM

## 2022-08-16 NOTE — Telephone Encounter (Signed)
Requested medication (s) are due for refill today -no  Requested medication (s) are on the active medication list -no  Future visit scheduled -yes  Last refill: 03/10/22  Notes to clinic: Rx no longer on current medication list- request sent for review   Requested Prescriptions  Pending Prescriptions Disp Refills   ONETOUCH ULTRA test strip [Pharmacy Med Name: ONE TOUCH ULTRA BLUE TEST STRP] 200 strip 3    Sig: [place]     Endocrinology: Diabetes - Testing Supplies Passed - 08/16/2022 12:39 PM      Passed - Valid encounter within last 12 months    Recent Outpatient Visits           1 week ago Walking pneumonia   [facility] Jerrol Banana., MD   1 month ago Walking pneumonia   [facility] Jerrol Banana., MD   4 months ago Controlled type 2 diabetes mellitus with other circulatory complication, unspecified whether long term insulin use [facility])   [facility] Jerrol Banana., MD   4 months ago Hemotympanum, right   [employer], Dani Gobble, PA-C   5 months ago Walking pneumonia   [employer] Mikey Kirschner, PA-C       Future Appointments             In 2 weeks Dunn, Areta Haber, PA-C [place]. [place]   In 1 month Simmons-Robinson, Riki Sheer, MD [facility], [facility]               Requested Prescriptions  Pending Prescriptions Disp Refills   ONETOUCH ULTRA test strip [Pharmacy Med Name: [place] TEST STRP] 200 strip 3    Sig: [place]     Endocrinology: Diabetes - Testing Supplies Passed - 08/16/2022 12:39 PM      Passed - Valid encounter within last 12 months    Recent Outpatient Visits           1 week ago Walking pneumonia   [facility] Jerrol Banana., MD   1 month ago Walking pneumonia   [facility] Jerrol Banana., MD   4  months ago Controlled type 2 diabetes mellitus with other circulatory complication, unspecified whether long term insulin use [facility])   [facility] Jerrol Banana., MD   4 months ago Hemotympanum, right   [employer], Dani Gobble, PA-C   5 months ago Walking pneumonia   [employer] Mikey Kirschner, PA-C       Future Appointments             In 2 weeks Dunn, Areta Haber, PA-C [place]. [place]   In 1 month Simmons-Robinson, Riki Sheer, MD [facility], [place]

## 2022-08-19 ENCOUNTER — Inpatient Hospital Stay: Payer: PPO | Attending: Oncology

## 2022-08-19 DIAGNOSIS — D500 Iron deficiency anemia secondary to blood loss (chronic): Secondary | ICD-10-CM

## 2022-08-19 DIAGNOSIS — D509 Iron deficiency anemia, unspecified: Secondary | ICD-10-CM | POA: Diagnosis not present

## 2022-08-19 DIAGNOSIS — N183 Chronic kidney disease, stage 3 unspecified: Secondary | ICD-10-CM

## 2022-08-19 DIAGNOSIS — D649 Anemia, unspecified: Secondary | ICD-10-CM

## 2022-08-19 LAB — CBC WITH DIFFERENTIAL/PLATELET
Abs Immature Granulocytes: 0.03 10*3/uL (ref 0.00–0.07)
Basophils Absolute: 0.1 10*3/uL (ref 0.0–0.1)
Basophils Relative: 1 %
Eosinophils Absolute: 0.1 10*3/uL (ref 0.0–0.5)
Eosinophils Relative: 1 %
HCT: 34.4 % — ABNORMAL LOW (ref 36.0–46.0)
Hemoglobin: 11.2 g/dL — ABNORMAL LOW (ref 12.0–15.0)
Immature Granulocytes: 0 %
Lymphocytes Relative: 17 %
Lymphs Abs: 1.4 10*3/uL (ref 0.7–4.0)
MCH: 30.2 pg (ref 26.0–34.0)
MCHC: 32.6 g/dL (ref 30.0–36.0)
MCV: 92.7 fL (ref 80.0–100.0)
Monocytes Absolute: 0.8 10*3/uL (ref 0.1–1.0)
Monocytes Relative: 10 %
Neutro Abs: 5.9 10*3/uL (ref 1.7–7.7)
Neutrophils Relative %: 71 %
Platelets: 186 10*3/uL (ref 150–400)
RBC: 3.71 MIL/uL — ABNORMAL LOW (ref 3.87–5.11)
RDW: 18.3 % — ABNORMAL HIGH (ref 11.5–15.5)
WBC: 8.3 10*3/uL (ref 4.0–10.5)
nRBC: 0 % (ref 0.0–0.2)

## 2022-08-19 LAB — RETIC PANEL
Immature Retic Fract: 23 % — ABNORMAL HIGH (ref 2.3–15.9)
RBC.: 3.72 MIL/uL — ABNORMAL LOW (ref 3.87–5.11)
Retic Count, Absolute: 60.3 10*3/uL (ref 19.0–186.0)
Retic Ct Pct: 1.6 % (ref 0.4–3.1)
Reticulocyte Hemoglobin: 33.5 pg (ref >27.9)

## 2022-08-19 LAB — IRON AND TIBC
Iron: 47 ug/dL (ref 28–170)
Saturation Ratios: 14 % (ref 10.4–31.8)
TIBC: 336 ug/dL (ref 250–450)
UIBC: 289 ug/dL

## 2022-08-19 LAB — FERRITIN: Ferritin: 80 ng/mL (ref 11–307)

## 2022-08-19 LAB — SAMPLE TO BLOOD BANK

## 2022-08-22 ENCOUNTER — Encounter: Payer: Self-pay | Admitting: Oncology

## 2022-08-22 ENCOUNTER — Other Ambulatory Visit: Payer: PPO

## 2022-08-22 ENCOUNTER — Telehealth: Payer: Self-pay | Admitting: Family Medicine

## 2022-08-22 ENCOUNTER — Inpatient Hospital Stay: Payer: PPO | Admitting: Oncology

## 2022-08-22 ENCOUNTER — Inpatient Hospital Stay: Payer: PPO

## 2022-08-22 VITALS — BP 126/42 | HR 67 | Temp 98.5°F | Resp 16 | Wt 151.0 lb

## 2022-08-22 DIAGNOSIS — D500 Iron deficiency anemia secondary to blood loss (chronic): Secondary | ICD-10-CM

## 2022-08-22 DIAGNOSIS — D509 Iron deficiency anemia, unspecified: Secondary | ICD-10-CM | POA: Diagnosis not present

## 2022-08-22 MED ORDER — SODIUM CHLORIDE 0.9 % IV SOLN
INTRAVENOUS | Status: DC
  Filled 2022-08-22: qty 250

## 2022-08-22 MED ORDER — SODIUM CHLORIDE 0.9 % IV SOLN
200.0000 mg | Freq: Once | INTRAVENOUS | Status: AC
  Administered 2022-08-22: 200 mg via INTRAVENOUS
  Filled 2022-08-22: qty 200

## 2022-08-22 NOTE — Telephone Encounter (Signed)
Copied from [place]. Topic: Medicare AWV >> Aug 22, 2022 11:06 AM Jae Dire wrote: Reason for CRM:  Left message for patient to call back and schedule Medicare Annual Wellness Visit (AWV) in office.   If unable to come into the office for AWV,  please offer to do virtually or by telephone.  Last AWV: 08/11/2021  Please schedule at anytime with [facility].  30 minute appointment for Virtual or phone 45 minute appointment for in office or Initial virtual/phone  Any questions, please contact me at [phone]

## 2022-08-22 NOTE — Assessment & Plan Note (Signed)
Labs reviewed and discussed with patient. Both hemoglobin and iron panel have improved. Recommend patient to proceed with Venofer 200 mg x 1 to further improve her iron store.

## 2022-08-22 NOTE — Progress Notes (Signed)
Hematology/Oncology Consult note [phone] [phone]      Patient Care Team: Jerrol Banana., MD as PCP - General (Family Medicine) Rockey Situ Kathlene November, MD as PCP - Cardiology (Cardiology) Birder Robson, MD as Referring Physician (Ophthalmology) Gardiner Barefoot, DPM as Consulting Physician (Podiatry) Rockey Situ Kathlene November, MD as Consulting Physician (Cardiology)   REFERRING PROVIDER: Jerrol Banana.,*  CHIEF COMPLAINTS/REASON FOR VISIT:  Anemia  ASSESSMENT & PLAN:  Iron deficiency anemia Labs reviewed and discussed with patient. Both hemoglobin and iron panel have improved. Recommend patient to proceed with Venofer 200 mg x 1 to further improve her iron store.  Orders Placed This Encounter  Procedures   CBC with Differential/Platelet    Standing Status:   Future    Standing Expiration Date:   08/23/2023   Iron and TIBC    Standing Status:   Future    Standing Expiration Date:   08/23/2023   Ferritin    Standing Status:   Future    Standing Expiration Date:   08/23/2023   Follow-up in 6 months. All questions were answered. The patient knows to call the clinic with any problems, questions or concerns.  Jasmine Server, MD, PhD [facility] Hematology Oncology 08/22/2022     HISTORY OF PRESENTING ILLNESS:  Jasmine Webster is a  88 y.o.  female with PMH listed below who was referred to me for anemia Reviewed patient's recent labs that was done.   Reviewed patient's previous labs ordered by primary care physician's office, anemia is chronic onset , duration is since January 2023. Patient reports very fatigued, lightheaded, shortness of breath with minimal exertion. She denies recent chest pain on exertion, pre-syncopal episodes, or palpitations She had not noticed any recent bleeding such as epistaxis, hematuria or hematochezia.  +Dark stool She denies over the counter NSAID ingestion. She is on antiplatelets agent-aspirin 81 mg. She denies  any pica and eats a variety of diet.  She was hospitalized from 05/21/2022 - 05/24/2022, due to NSTEMI and acute CHF exacerbation, symptomatic anemia, status post 2 unit of PRBC transfusion.  Patient reports feeling better after blood transfusion. Cardiology recommends no invasive intervention during this admission.  There is plan for possible TAVR   INTERVAL HISTORY RENE GONSOULIN is a 88 y.o. female who has above history reviewed by me today presents for follow up visit for iron deficiency anemia. Patient will resume IV Venofer treatments.  Capsule study did not reveal any additional bleeding source.  She feels well today. Chronic fatigue, stable.  Not worse.  MEDICAL HISTORY:  Past Medical History:  Diagnosis Date   Arthritis    Cataract    cataract removal bilaterally approx 20 years ago   Chronic back pain    Coronary artery disease    COVID-19 virus infection 12/2020   Diabetes mellitus without complication (HCC)    Heart murmur    Hyperlipidemia    Hypertension     SURGICAL HISTORY: Past Surgical History:  Procedure Laterality Date   ABDOMINAL HYSTERECTOMY     APPENDECTOMY     BACK SURGERY     three   CARDIAC CATHETERIZATION     6+yrs no stents Dr. Verneda Skill   CATARACT EXTRACTION     RIGHT/LEFT HEART CATH AND CORONARY ANGIOGRAPHY N/A 04/26/2022   Procedure: RIGHT/LEFT HEART CATH AND CORONARY ANGIOGRAPHY;  Surgeon: Wellington Hampshire, MD;  Location: [facility];  Service: Cardiovascular;  Laterality: N/A;   THORACOLUMBAR [place]  TONSILLECTOMY AND ADENOIDECTOMY      SOCIAL HISTORY: Social History   Socioeconomic History   Marital status: Widowed    Spouse name: Not on file   Number of children: 2   Years of education: Not on file   Highest education level: 8th grade  Occupational History   Occupation: retired  Tobacco Use   Smoking status: Never   Smokeless tobacco: Never  Vaping Use   Vaping Use: Never used  Substance and Sexual  Activity   Alcohol use: No    Alcohol/week: 0.0 standard drinks of alcohol   Drug use: No   Sexual activity: Not Currently  Other Topics Concern   Not on file  Social History Narrative   Lives with daughter, Katherina Right   Social Determinants of Health   Financial Resource Strain: Low Risk  (07/03/2022)   Overall Financial Resource Strain (CARDIA)    Difficulty of Paying Living Expenses: Not hard at all  Food Insecurity: No Food Insecurity (07/03/2022)   Hunger Vital Sign    Worried About Running Out of Food in the Last Year: Never true    [place] in the Last Year: Never true  Transportation Needs: No Transportation Needs (07/03/2022)   PRAPARE - Hydrologist (Medical): No    Lack of Transportation (Non-Medical): No  Physical Activity: Inactive (07/12/2020)   Exercise Vital Sign    Days of Exercise per Week: 0 days    Minutes of Exercise per Session: 0 min  Stress: No Stress Concern Present (07/12/2020)   [place]    Feeling of Stress : Not at all  Social Connections: Moderately Isolated (07/03/2022)   Social Connection and Isolation Panel [NHANES]    Frequency of Communication with Friends and Family: More than three times a week    Frequency of Social Gatherings with Friends and Family: More than three times a week    Attends Religious Services: More than 4 times per year    Active Member of [employer] or Organizations: No    Attends Archivist Meetings: Never    Marital Status: Widowed  Intimate Partner Violence: Not At Risk (07/12/2020)   Humiliation, Afraid, Rape, and Kick questionnaire    Fear of Current or Ex-Partner: No    Emotionally Abused: No    Physically Abused: No    Sexually Abused: No    FAMILY HISTORY: Family History  Problem Relation Age of Onset   Heart disease Mother        died from MI   Heart attack Mother    Heart disease Sister    Sudden  death Brother        shot and beaten to death during home invasion   Stroke Sister    Sudden death Sister        MVA   Cancer Brother        died from lung cancer   Sudden death Sister        28 days old   Cancer Brother        died from lung cancer   Stroke Brother        cause of death   Diabetes Father    Stroke Father    Pneumonia Father     ALLERGIES:  is allergic to etodolac, naproxen, statins, latex, terbinafine, and terbinafine hcl.  MEDICATIONS:  Current Outpatient Medications  Medication Sig Dispense Refill   ALPRAZolam (  XANAX) 0.5 MG tablet TAKE 1 TABLET BY MOUTH EVERY DAY AT BEDTIME AS NEEDED 30 tablet 4   Artificial Tear Ointment (DRY EYES OP) Apply 1 drop to eye daily.     aspirin EC 81 MG tablet Take 81 mg by mouth daily.     co-enzyme Q-10 30 MG capsule Take 30 mg by mouth daily.     ezetimibe (ZETIA) 10 MG tablet TAKE 1 TABLET BY MOUTH EVERY DAY 30 tablet 11   furosemide (LASIX) 20 MG tablet Take 1 tablet (20 mg total) by mouth every other day. 30 tablet 0   LANTUS SOLOSTAR 100 UNIT/ML Solostar Pen INJECT 25 UNITS SUBCUTANEOUSLY 45 mL 0   lisinopril (ZESTRIL) 10 MG tablet TAKE 1 TABLET BY MOUTH EVERY DAY 30 tablet 11   Magnesium 400 MG CAPS Take 400 mg by mouth daily.     Multiple Vitamins-Minerals (PRESERVISION AREDS) CAPS Take 1 capsule by mouth 2 (two) times daily.      nitroGLYCERIN (NITROSTAT) 0.4 MG SL tablet Place 1 tablet (0.4 mg total) under the tongue every 5 (five) minutes as needed for chest pain. 25 tablet 3   Omega-3 Fatty Acids (FISH OIL PO) Take by mouth daily.     omeprazole (PRILOSEC) 40 MG capsule TAKE 1 CAPSULE (40 MG TOTAL) BY MOUTH DAILY. 30 capsule 11   ondansetron (ZOFRAN) 4 MG tablet TAKE 1 TABLET BY MOUTH EVERY 8 HOURS AS NEEDED FOR NAUSEA AND VOMITING 20 tablet 1   ONETOUCH ULTRA test strip CHECK SUGAR TWICE A DAY 200 strip 3   pantoprazole (PROTONIX) 40 MG tablet Take 1 tablet (40 mg total) by mouth 2 (two) times daily before a meal.  60 tablet 1   potassium chloride (KLOR-CON) 10 MEQ tablet TAKE 1 TABLET BY MOUTH 2-3 TIMES A WEEK WITH FUROSEMIDE AS DIRECTED     SEMGLEE, YFGN, 100 UNIT/ML Pen 18 Units daily.     Turmeric 500 MG CAPS Take by mouth daily.     vitamin B-12 (CYANOCOBALAMIN) 500 MCG tablet Take 500 mcg by mouth once a week.     No current facility-administered medications for this visit.    Review of Systems  Constitutional:  Positive for fatigue. Negative for chills and fever.  HENT:   Negative for hearing loss and voice change.   Eyes:  Negative for eye problems.  Respiratory:  Negative for chest tightness, cough and shortness of breath.   Cardiovascular:  Negative for chest pain.  Gastrointestinal:  Negative for abdominal distention, abdominal pain and blood in stool.  Endocrine: Negative for hot flashes.  Genitourinary:  Negative for difficulty urinating and frequency.   Musculoskeletal:  Negative for arthralgias.  Skin:  Negative for itching and rash.  Neurological:  Negative for extremity weakness and light-headedness.  Hematological:  Negative for adenopathy.  Psychiatric/Behavioral:  Negative for confusion.     PHYSICAL EXAMINATION: ECOG PERFORMANCE STATUS: 2 - Symptomatic, <50% confined to bed Vitals:   08/22/22 1410  BP: (!) 126/42  Pulse: 67  Resp: 16  Temp: 98.5 F (36.9 C)  SpO2: 100%   Filed Weights   08/22/22 1410  Weight: 151 lb (68.5 kg)    Physical Exam Constitutional:      General: She is not in acute distress.    Comments: Patient sits in a wheelchair  HENT:     Head: Normocephalic and atraumatic.  Eyes:     General: No scleral icterus. Cardiovascular:     Rate and Rhythm: Normal rate and regular  rhythm.     Heart sounds: Murmur heard.  Pulmonary:     Effort: Pulmonary effort is normal. No respiratory distress.     Breath sounds: No wheezing.  Abdominal:     General: Bowel sounds are normal. There is no distension.     Palpations: Abdomen is soft.   Musculoskeletal:        General: No deformity. Normal range of motion.     Cervical back: Normal range of motion and neck supple.     Comments: Trace edema bilaterally  Skin:    General: Skin is warm and dry.     Coloration: Skin is pale.     Findings: No erythema or rash.  Neurological:     Mental Status: She is alert and oriented to person, place, and time. Mental status is at baseline.     Cranial Nerves: No cranial nerve deficit.  Psychiatric:        Mood and Affect: Mood normal.      LABORATORY DATA:  I have reviewed the data as listed    Latest Ref Rng & Units 08/19/2022    9:34 AM 06/26/2022    4:27 PM 06/20/2022   12:15 PM  CBC  WBC 4.0 - 10.5 K/uL 8.3  6.0  6.3   Hemoglobin 12.0 - 15.0 g/dL 11.2  7.2  7.6   Hematocrit 36.0 - 46.0 % 34.4  23.8  25.2   Platelets 150 - 400 K/uL 186  114  PLATELET CLUMPS NOTED ON SMEAR       Latest Ref Rng & Units 06/26/2022    4:27 PM 06/20/2022   12:15 PM 05/27/2022    2:59 PM  CMP  Glucose 70 - 99 mg/dL 204  136  119   BUN 8 - 23 mg/dL '29  31  31   '$ Creatinine 0.44 - 1.00 mg/dL 1.43  1.36  1.23   Sodium 135 - 145 mmol/L 133  137  137   Potassium 3.5 - 5.1 mmol/L 4.2  4.4  4.7   Chloride 98 - 111 mmol/L 100  108  105   CO2 22 - 32 mmol/L '23  22  26   '$ Calcium 8.9 - 10.3 mg/dL 8.3  9.0    8.9  8.9   Total Protein 6.5 - 8.1 g/dL 6.2     Total Bilirubin 0.3 - 1.2 mg/dL 0.4     Alkaline Phos 38 - 126 U/L 105     AST 15 - 41 U/L 17     ALT 0 - 44 U/L 10         Component Value Date/Time   IRON 47 08/19/2022 0934   IRON 36 12/04/2021 1302   TIBC 336 08/19/2022 0934   TIBC 327 12/04/2021 1302   FERRITIN 80 08/19/2022 0934   FERRITIN 31 12/04/2021 1302   IRONPCTSAT 14 08/19/2022 0934   IRONPCTSAT 11 (L) 12/04/2021 1302     RADIOGRAPHIC STUDIES: I have personally reviewed the radiological images as listed and agreed with the findings in the report. No results found.

## 2022-08-22 NOTE — Progress Notes (Signed)
Patient here for oncology follow-up appointment,  no new concerns at this time.    

## 2022-08-26 DIAGNOSIS — T1512XA Foreign body in conjunctival sac, left eye, initial encounter: Secondary | ICD-10-CM | POA: Diagnosis not present

## 2022-08-26 DIAGNOSIS — H16142 Punctate keratitis, left eye: Secondary | ICD-10-CM | POA: Diagnosis not present

## 2022-08-27 NOTE — Progress Notes (Deleted)
Cardiology Office Note    Date:  08/27/2022   ID:  Jasmine Webster, DOB Mar 26, 1926, MRN [id number]  PCP:  Jerrol Banana., MD  Cardiologist:  Ida Rogue, MD  Electrophysiologist:  None   Chief Complaint: ***  History of Present Illness:   Jasmine Webster is a 88 y.o. female with history of ***  ***   Labs independently reviewed: 08/2022 - Hgb 11.2, PLT 186 06/2022 - albumin 3.6, AST/ALT normal, potassium 4.2, BUN 29, serum creatinine 1.43 03/2022 - A1c 7.4 11/2021 - TSH normal, magnesium 2.0 01/2021 - TC 185, TG 175, HDL 53, LDL 102  Past Medical History:  Diagnosis Date   Arthritis    Cataract    cataract removal bilaterally approx 20 years ago   Chronic back pain    Coronary artery disease    COVID-19 virus infection 12/2020   Diabetes mellitus without complication (HCC)    Heart murmur    Hyperlipidemia    Hypertension     Past Surgical History:  Procedure Laterality Date   ABDOMINAL HYSTERECTOMY     APPENDECTOMY     BACK SURGERY     three   CARDIAC CATHETERIZATION     6+yrs no stents Dr. Verneda Skill   CATARACT EXTRACTION     RIGHT/LEFT HEART CATH AND CORONARY ANGIOGRAPHY N/A 04/26/2022   Procedure: RIGHT/LEFT HEART CATH AND CORONARY ANGIOGRAPHY;  Surgeon: Wellington Hampshire, MD;  Location: [facility];  Service: Cardiovascular;  Laterality: N/A;   THORACOLUMBAR [place] AND ADENOIDECTOMY      Current Medications: No outpatient medications have been marked as taking for the 08/30/22 encounter (Appointment) with Rise Mu, PA-C.    Allergies:   Etodolac, Naproxen, Statins, Latex, Terbinafine, and Terbinafine hcl   Social History   Socioeconomic History   Marital status: Widowed    Spouse name: Not on file   Number of children: 2   Years of education: Not on file   Highest education level: 8th grade  Occupational History   Occupation: retired  Tobacco Use   Smoking status: Never   Smokeless  tobacco: Never  Vaping Use   Vaping Use: Never used  Substance and Sexual Activity   Alcohol use: No    Alcohol/week: 0.0 standard drinks of alcohol   Drug use: No   Sexual activity: Not Currently  Other Topics Concern   Not on file  Social History Narrative   Lives with daughter, Katherina Right   Social Determinants of Health   Financial Resource Strain: Low Risk  (07/03/2022)   Overall Financial Resource Strain (CARDIA)    Difficulty of Paying Living Expenses: Not hard at all  Food Insecurity: No Food Insecurity (07/03/2022)   Hunger Vital Sign    Worried About Running Out of Food in the Last Year: Never true    [place] in the Last Year: Never true  Transportation Needs: No Transportation Needs (07/03/2022)   PRAPARE - Hydrologist (Medical): No    Lack of Transportation (Non-Medical): No  Physical Activity: Inactive (07/12/2020)   Exercise Vital Sign    Days of Exercise per Week: 0 days    Minutes of Exercise per Session: 0 min  Stress: No Stress Concern Present (07/12/2020)   [place]    Feeling of Stress : Not at all  Social Connections: Moderately Isolated (07/03/2022)  Social Licensed conveyancer [NHANES]    Frequency of Communication with Friends and Family: More than three times a week    Frequency of Social Gatherings with Friends and Family: More than three times a week    Attends Religious Services: More than 4 times per year    Active Member of [employer] or Organizations: No    Attends Archivist Meetings: Never    Marital Status: Widowed     Family History:  The patient's family history includes Cancer in her brother and brother; Diabetes in her father; Heart attack in her mother; Heart disease in her mother and sister; Pneumonia in her father; Stroke in her brother, father, and sister; Sudden death in her brother, sister, and sister.  ROS:    ROS   EKGs/Labs/Other Studies Reviewed:    Studies reviewed were summarized above. The additional studies were reviewed today:  2D echo 05/22/2022: 1. Left ventricular ejection fraction, by estimation, is 55 to 60%. The  left ventricle has normal function. The left ventricle has no regional  wall motion abnormalities. There is mild left ventricular hypertrophy.  Left ventricular diastolic parameters  are consistent with Grade II diastolic dysfunction (pseudonormalization).   2. Right ventricular systolic function is normal. The right ventricular  size is normal.   3. The mitral valve is degenerative. Mild mitral valve regurgitation.  Mild to moderate mitral stenosis. The mean mitral valve gradient is 6.0  mmHg. Moderate to severe mitral annular calcification.   4. Mean aortic valve gradient 74mHg, peak gradient 646mg, Vmax 4 m/s,  AVA 0.6cm2, DVI 0.19. The aortic valve is calcified. Aortic valve  regurgitation is not visualized. Severe aortic valve stenosis. __________  Coronary morphology 05/16/2022: IMPRESSION: 1. Trileaflet aortic valve with severe calcifications (AV calcium score 1424)   2. Small aortic annulus measuring 2130m 31m21mith perimeter 58mm52m area 260 mm^2. No annular or LVOT calcifications. While annular measurements are suitable for placement of a 23mm 68mut valve, the Sinus of Valsalva diameter measures 23mm a76me right and noncoronary cusps, which is below the recommended minimum Sinus of Valsalva diameter (25mm) f57m 23mm Evo25m Annulus area is also below the recommended minimum for a 20mm Sapi46m valve. Recommend discussion at structural heart conference.   3.  Low coronary height to left main, measuring 11mm   4. 51mimum Fluoroscopic Angle for Delivery:  LAO 7 CAU 19   5.  Coronary calcium score 117 ___________  R/[facility] 6[facility]  Ost LM lesion is 10% stenosed.   [place]amus lesion is 60% stenosed.   Prox LAD to Mid LAD lesion is 30%  stenosed.   1.  Left dominant coronary arteries with moderate nonobstructive one-vessel coronary artery disease involving the ramus.  No evidence of obstructive disease. 2. Right heart catheterization showed normal RA pressure, moderately elevated wedge pressure, severe pulmonary hypertension and normal cardiac output.  Prominent V waves on pulmonary wedge tracing suggestive of mitral regurgitation. 3.  Severe aortic stenosis with mean gradient of 40 mmHg.     RA: 2 mmHg RV: 65/2 mmHg PA: 68/27 with a mean of 46 mmHg PCW: Mean pressure of 26 mmHg with prominent V waves at 38 mmHg. LVEDP: 30 mmHg. Cardiac output: 7.5 with an index of 4.3.   Recommendations: Proceed with TAVR evaluation.  No need for coronary revascularization. __________  2D echo 07/25/2021: 1. Left ventricular ejection fraction, by estimation, is 50 to 55%. The  left ventricle has low normal function. The  left ventricle has no regional  wall motion abnormalities. Left ventricular diastolic parameters are  consistent with Grade II diastolic  dysfunction (pseudonormalization).   2. Right ventricular systolic function is normal. The right ventricular  size is normal. Tricuspid regurgitation signal is inadequate for assessing  PA pressure.   3. The mitral valve is degenerative. Mild mitral valve regurgitation.   4. Aortic valve mean gradient is 65mHg, PG 51 mmHg, DVI = 0.28, peak  velocity 3.519m. The aortic valve is calcified. Aortic valve  regurgitation is mild. Moderate aortic valve stenosis.   5. The inferior vena cava is normal in size with greater than 50%  respiratory variability, suggesting right atrial pressure of 3 mmHg. __________  Coronary CTA 07/12/2021: FINDINGS: Aorta: Normal size. Moderate ascending and descending aorta calcifications. No dissection.   Aortic Valve:  Trileaflet.  moderate calcifications.   Coronary Arteries:  Normal coronary origin.  left dominance.   RCA is a small non dominant  artery. There is no plaque.   Left main is a large artery that gives rise to LAD and LCX arteries.   LAD is a large vessel that has mild proximal calcifications causing minimal stenosis (<25%).   LCX is a dominant artery that gives rise to an OM1 branch. The LCx further gives rise to the PDA. There is mild proximal and distal LCx calcifications causing minimal stenosis.   Other findings:   Normal pulmonary vein drainage into the left atrium.   Normal left atrial appendage without a thrombus.   Normal size of the pulmonary artery.   IMPRESSION: 1. Coronary calcium score of 80.2.   2. Normal coronary origin with left dominance.   3. Minimal non obstructive proximal LAD and LCx disease.   4. CAD-RADS 1. Minimal non-obstructive CAD (0-24%). Consider non-atherosclerotic causes of chest pain. Consider preventive therapy and risk factor modification. __________  2D echo 02/23/2020: 1. Left ventricular ejection fraction, by estimation, is 55 to 60%. The  left ventricle has normal function. The left ventricle has no regional  wall motion abnormalities. There is mild left ventricular hypertrophy.  Left ventricular diastolic parameters  are consistent with Grade II diastolic dysfunction (pseudonormalization).   2. Right ventricular systolic function is normal. The right ventricular  size is normal. There is moderately elevated pulmonary artery systolic  pressure.   3. The mitral valve is degenerative. Mild mitral valve regurgitation.   4. The aortic valve is tricuspid. Aortic valve regurgitation is mild.  Moderate aortic valve stenosis. Aortic valve mean gradient measures 25.8  mmHg. Aortic valve Vmax measures 3.59 m/s.   5. The inferior vena cava is normal in size with greater than 50%  respiratory variability, suggesting right atrial pressure of 3 mmHg. __________  2D echo 01/27/2019: 1. The left ventricle has normal systolic function with an ejection  fraction of 60-65%. The  cavity size was normal. There is mildly increased  left ventricular wall thickness. Left ventricular diastolic Doppler  parameters are consistent with impaired  relaxation.   2. The right ventricle has normal systolic function. The cavity was  normal. There is no increase in right ventricular wall thickness. Right  ventricular systolic pressure is severely elevated with an estimated  pressure of 62.0 mmHg.   3. Tricuspid valve regurgitation is mild-moderate.   4. Mmoderate stenosis of the aortic valve. Mean gradient 26 mm Hg   EKG:  EKG is ordered today.  The EKG ordered today demonstrates ***  Recent Labs: 11/26/2021: Magnesium 2.0; TSH 2.066 05/21/2022: B Natriuretic  Peptide 300.3 06/26/2022: ALT 10; BUN 29; Creatinine, Ser 1.43; Potassium 4.2; Sodium 133 08/19/2022: Hemoglobin 11.2; Platelets 186  Recent Lipid Panel    Component Value Date/Time   CHOL 185 01/19/2021 0805   TRIG 175 (H) 01/19/2021 0805   HDL 53 01/19/2021 0805   CHOLHDL 3.5 01/19/2021 0805   LDLCALC 102 (H) 01/19/2021 0805    PHYSICAL EXAM:    VS:  There were no vitals taken for this visit.  BMI: There is no height or weight on file to calculate BMI.  Physical Exam  Wt Readings from Last 3 Encounters:  08/22/22 151 lb (68.5 kg)  08/08/22 148 lb (67.1 kg)  06/30/22 149 lb (67.6 kg)     ASSESSMENT & PLAN:   ***   {Are you ordering a CV Procedure (e.g. stress test, cath, DCCV, TEE, etc)?   Press F2        :[id number]}     Disposition: F/u with Dr. Rockey Situ or an APP in ***.   Medication Adjustments/Labs and Tests Ordered: Current medicines are reviewed at length with the patient today.  Concerns regarding medicines are outlined above. Medication changes, Labs and Tests ordered today are summarized above and listed in the Patient Instructions accessible in Encounters.   Signed, Christell Faith, PA-C 08/27/2022 2:04 PM     [address] [place], [place]  [phone]

## 2022-08-30 ENCOUNTER — Ambulatory Visit: Payer: PPO | Admitting: Physician Assistant

## 2022-09-02 ENCOUNTER — Other Ambulatory Visit: Payer: Self-pay | Admitting: Family Medicine

## 2022-09-02 DIAGNOSIS — E1159 Type 2 diabetes mellitus with other circulatory complications: Secondary | ICD-10-CM

## 2022-09-02 NOTE — Telephone Encounter (Signed)
Requested medication (s) are due for refill today: no  Requested medication (s) are on the active medication list: no  Last refill:  05/09/22 18 units (historical provider)  Future visit scheduled: 10/02/22, last seen 08/08/22  Notes to clinic:  dose inconsistent with current med list, historical provider, no protocol, please assess.      Requested Prescriptions  Pending Prescriptions Disp Refills   SEMGLEE, YFGN, 100 UNIT/ML Pen [Pharmacy Med Name: SEMGLEE (YFGN) 100 UNIT/ML PEN]  2    Sig: INJECT 25 UNITS SUBCUTANEOUSLY     Off-Protocol Failed - 09/02/2022  1:27 AM      Failed - Medication not assigned to a protocol, review manually.      Passed - Valid encounter within last 12 months    Recent Outpatient Visits           3 weeks ago Walking pneumonia   [facility] Jerrol Banana., MD   1 month ago Walking pneumonia   [facility] Jerrol Banana., MD   4 months ago Controlled type 2 diabetes mellitus with other circulatory complication, unspecified whether long term insulin use [facility])   [facility] Jerrol Banana., MD   5 months ago Hemotympanum, right   [employer], Dani Gobble, PA-C   5 months ago Walking pneumonia   [facility] Mikey Kirschner, PA-C       Future Appointments             In 1 month Simmons-Robinson, Riki Sheer, MD [facility], [place]   In 1 month Dunn, Areta Haber, PA-C [place]. [place]

## 2022-09-11 ENCOUNTER — Ambulatory Visit (INDEPENDENT_AMBULATORY_CARE_PROVIDER_SITE_OTHER): Payer: PPO

## 2022-09-11 VITALS — Wt 151.0 lb

## 2022-09-11 DIAGNOSIS — Z0000 Encounter for general adult medical examination without abnormal findings: Secondary | ICD-10-CM

## 2022-09-11 NOTE — Patient Instructions (Signed)
Jasmine Webster , Thank you for taking time to come for your Medicare Wellness Visit. I appreciate your ongoing commitment to your health goals. Please review the following plan we discussed and let me know if I can assist you in the future.   Screening recommendations/referrals: Colonoscopy: aged out Mammogram: aged out Bone Density: aged out Recommended yearly ophthalmology/optometry visit for glaucoma screening and checkup Recommended yearly dental visit for hygiene and checkup  Vaccinations: Influenza vaccine: 08/08/22 Pneumococcal vaccine: 12/27/14 Tdap vaccine: 03/29/10, due if have injury Shingles vaccine: Zostavax 11/18/12     Covid-19:12/02/19, 12/23/19, 07/19/20  Advanced directives: no  Conditions/risks identified: none  Next appointment: Follow up in one year for your annual wellness visit 09/15/23 @ 8:45 am by phone   Preventive Care 86 Years and Older, Female Preventive care refers to lifestyle choices and visits with your health care provider that can promote health and wellness. What does preventive care include? A yearly physical exam. This is also called an annual well check. Dental exams once or twice a year. Routine eye exams. Ask your health care provider how often you should have your eyes checked. Personal lifestyle choices, including: Daily care of your teeth and gums. Regular physical activity. Eating a healthy diet. Avoiding tobacco and drug use. Limiting alcohol use. Practicing safe sex. Taking low-dose aspirin every day. Taking vitamin and mineral supplements as recommended by your health care provider. What happens during an annual well check? The services and screenings done by your health care provider during your annual well check will depend on your age, overall health, lifestyle risk factors, and family history of disease. Counseling  Your health care provider may ask you questions about your: Alcohol use. Tobacco use. Drug use. Emotional  well-being. Home and relationship well-being. Sexual activity. Eating habits. History of falls. Memory and ability to understand (cognition). Work and work Statistician. Reproductive health. Screening  You may have the following tests or measurements: Height, weight, and BMI. Blood pressure. Lipid and cholesterol levels. These may be checked every 5 years, or more frequently if you are over 54 years old. Skin check. Lung cancer screening. You may have this screening every year starting at age 80 if you have a 30-pack-year history of smoking and currently smoke or have quit within the past 15 years. Fecal occult blood test (FOBT) of the stool. You may have this test every year starting at age 66. Flexible sigmoidoscopy or colonoscopy. You may have a sigmoidoscopy every 5 years or a colonoscopy every 10 years starting at age 53. Hepatitis C blood test. Hepatitis B blood test. Sexually transmitted disease (STD) testing. Diabetes screening. This is done by checking your blood sugar (glucose) after you have not eaten for a while (fasting). You may have this done every 1-3 years. Bone density scan. This is done to screen for osteoporosis. You may have this done starting at age 67. Mammogram. This may be done every 1-2 years. Talk to your health care provider about how often you should have regular mammograms. Talk with your health care provider about your test results, treatment options, and if necessary, the need for more tests. Vaccines  Your health care provider may recommend certain vaccines, such as: Influenza vaccine. This is recommended every year. Tetanus, diphtheria, and acellular pertussis (Tdap, Td) vaccine. You may need a Td booster every 10 years. Zoster vaccine. You may need this after age 9. Pneumococcal 13-valent conjugate (PCV13) vaccine. One dose is recommended after age 75. Pneumococcal polysaccharide (PPSV23) vaccine. One dose  is recommended after age 15. Talk to your  health care provider about which screenings and vaccines you need and how often you need them. This information is not intended to replace advice given to you by your health care provider. Make sure you discuss any questions you have with your health care provider. Document Released: 12/01/2015 Document Revised: 07/24/2016 Document Reviewed: 09/05/2015 Elsevier Interactive Patient Education  2017 [place] Prevention in the Home Falls can cause injuries. They can happen to people of all ages. There are many things you can do to make your home safe and to help prevent falls. What can I do on the outside of my home? Regularly fix the edges of walkways and driveways and fix any cracks. Remove anything that might make you trip as you walk through a door, such as a raised step or threshold. Trim any bushes or trees on the path to your home. Use bright outdoor lighting. Clear any walking paths of anything that might make someone trip, such as rocks or tools. Regularly check to see if handrails are loose or broken. Make sure that both sides of any steps have handrails. Any raised decks and porches should have guardrails on the edges. Have any leaves, snow, or ice cleared regularly. Use sand or salt on walking paths during winter. Clean up any spills in your garage right away. This includes oil or grease spills. What can I do in the bathroom? Use night lights. Install grab bars by the toilet and in the tub and shower. Do not use towel bars as grab bars. Use non-skid mats or decals in the tub or shower. If you need to sit down in the shower, use a plastic, non-slip stool. Keep the floor dry. Clean up any water that spills on the floor as soon as it happens. Remove soap buildup in the tub or shower regularly. Attach bath mats securely with double-sided non-slip rug tape. Do not have throw rugs and other things on the floor that can make you trip. What can I do in the bedroom? Use night  lights. Make sure that you have a light by your bed that is easy to reach. Do not use any sheets or blankets that are too big for your bed. They should not hang down onto the floor. Have a firm chair that has side arms. You can use this for support while you get dressed. Do not have throw rugs and other things on the floor that can make you trip. What can I do in the kitchen? Clean up any spills right away. Avoid walking on wet floors. Keep items that you use a lot in easy-to-reach places. If you need to reach something above you, use a strong step stool that has a grab bar. Keep electrical cords out of the way. Do not use floor polish or wax that makes floors slippery. If you must use wax, use non-skid floor wax. Do not have throw rugs and other things on the floor that can make you trip. What can I do with my stairs? Do not leave any items on the stairs. Make sure that there are handrails on both sides of the stairs and use them. Fix handrails that are broken or loose. Make sure that handrails are as long as the stairways. Check any carpeting to make sure that it is firmly attached to the stairs. Fix any carpet that is loose or worn. Avoid having throw rugs at the top or bottom of the stairs.  If you do have throw rugs, attach them to the floor with carpet tape. Make sure that you have a light switch at the top of the stairs and the bottom of the stairs. If you do not have them, ask someone to add them for you. What else can I do to help prevent falls? Wear shoes that: Do not have high heels. Have rubber bottoms. Are comfortable and fit you well. Are closed at the toe. Do not wear sandals. If you use a stepladder: Make sure that it is fully opened. Do not climb a closed stepladder. Make sure that both sides of the stepladder are locked into place. Ask someone to hold it for you, if possible. Clearly mark and make sure that you can see: Any grab bars or handrails. First and last  steps. Where the edge of each step is. Use tools that help you move around (mobility aids) if they are needed. These include: Canes. Walkers. Scooters. Crutches. Turn on the lights when you go into a dark area. Replace any light bulbs as soon as they burn out. Set up your furniture so you have a clear path. Avoid moving your furniture around. If any of your floors are uneven, fix them. If there are any pets around you, be aware of where they are. Review your medicines with your doctor. Some medicines can make you feel dizzy. This can increase your chance of falling. Ask your doctor what other things that you can do to help prevent falls. This information is not intended to replace advice given to you by your health care provider. Make sure you discuss any questions you have with your health care provider. Document Released: 08/31/2009 Document Revised: 04/11/2016 Document Reviewed: 12/09/2014 Elsevier Interactive Patient Education  2017 [employer].

## 2022-09-11 NOTE — Progress Notes (Signed)
Virtual Visit via Telephone Note  I connected with  Jasmine Webster on 09/11/22 at 10:30 AM EDT by telephone and verified that I am speaking with the correct person using two identifiers.  Location: Patient: home Provider: [facility] Persons participating in the virtual visit: [place]   I discussed the limitations, risks, security and privacy concerns of performing an evaluation and management service by telephone and the availability of in person appointments. The patient expressed understanding and agreed to proceed.  Interactive audio and video telecommunications were attempted between this nurse and patient, however failed, due to patient having technical difficulties OR patient did not have access to video capability.  We continued and completed visit with audio only.  Some vital signs may be absent or patient reported.   Jasmine David, LPN  Subjective:   Jasmine Webster is a 88 y.o. female who presents for Medicare Annual (Subsequent) preventive examination.  Review of Systems     Cardiac Risk Factors include: advanced age (>57mn, >>37women);diabetes mellitus;dyslipidemia     Objective:    There were no vitals filed for this visit. There is no height or weight on file to calculate BMI.     09/11/2022   10:38 AM 08/22/2022    2:10 PM 07/15/2022    1:00 PM 06/30/2022   12:03 PM 06/26/2022    3:17 PM 05/21/2022    6:39 PM 11/27/2021    2:00 AM  Advanced Directives  Does Patient Have a Medical Advance Directive? No Yes Yes Yes Yes Yes Yes  Type of Advance Directive      Out of facility DNR (pink MOST or yellow form) [place] of [place] D[place] (pink MOST or yellow form)  Does patient want to make changes to medical advance directive?   No - Patient declined   No - Patient declined No - Patient declined  Would patient like information on creating a medical advance directive? No - Patient declined          Current Medications  (verified) Outpatient Encounter Medications as of 09/11/2022  Medication Sig   ALPRAZolam (XANAX) 0.5 MG tablet TAKE 1 TABLET BY MOUTH EVERY DAY AT BEDTIME AS NEEDED   Artificial Tear Ointment (DRY EYES OP) Apply 1 drop to eye daily.   aspirin EC 81 MG tablet Take 81 mg by mouth daily.   co-enzyme Q-10 30 MG capsule Take 30 mg by mouth daily.   ezetimibe (ZETIA) 10 MG tablet TAKE 1 TABLET BY MOUTH EVERY DAY   furosemide (LASIX) 20 MG tablet Take 1 tablet (20 mg total) by mouth every other day.   insulin glargine-yfgn (SEMGLEE, YFGN,) 100 UNIT/ML Pen INJECT 25 UNITS SUBCUTANEOUSLY   lisinopril (ZESTRIL) 10 MG tablet TAKE 1 TABLET BY MOUTH EVERY DAY   Magnesium 400 MG CAPS Take 400 mg by mouth daily.   Multiple Vitamins-Minerals (PRESERVISION AREDS) CAPS Take 1 capsule by mouth 2 (two) times daily.    nitroGLYCERIN (NITROSTAT) 0.4 MG SL tablet Place 1 tablet (0.4 mg total) under the tongue every 5 (five) minutes as needed for chest pain.   Omega-3 Fatty Acids (FISH OIL PO) Take by mouth daily.   omeprazole (PRILOSEC) 40 MG capsule TAKE 1 CAPSULE (40 MG TOTAL) BY MOUTH DAILY.   ONETOUCH ULTRA test strip CHECK SUGAR TWICE A DAY   pantoprazole (PROTONIX) 40 MG tablet Take 1 tablet (40 mg total) by mouth 2 (two) times daily before a meal.   potassium chloride (KLOR-CON)  10 MEQ tablet TAKE 1 TABLET BY MOUTH 2-3 TIMES A WEEK WITH FUROSEMIDE AS DIRECTED   SEMGLEE, YFGN, 100 UNIT/ML Pen 18 Units daily.   tobramycin (TOBREX) 0.3 % ophthalmic solution Place into the left eye.   vitamin B-12 (CYANOCOBALAMIN) 500 MCG tablet Take 500 mcg by mouth once a week.   ondansetron (ZOFRAN) 4 MG tablet TAKE 1 TABLET BY MOUTH EVERY 8 HOURS AS NEEDED FOR NAUSEA AND VOMITING (Patient not taking: Reported on 09/11/2022)   Turmeric 500 MG CAPS Take by mouth daily. (Patient not taking: Reported on 09/11/2022)   No facility-administered encounter medications on file as of 09/11/2022.    Allergies (verified) Etodolac,  Naproxen, Statins, Latex, Terbinafine, and Terbinafine hcl   History: Past Medical History:  Diagnosis Date   Arthritis    Cataract    cataract removal bilaterally approx 20 years ago   Chronic back pain    Coronary artery disease    COVID-19 virus infection 12/2020   Diabetes mellitus without complication (HCC)    Heart murmur    Hyperlipidemia    Hypertension    Past Surgical History:  Procedure Laterality Date   ABDOMINAL HYSTERECTOMY     APPENDECTOMY     BACK SURGERY     three   CARDIAC CATHETERIZATION     6+yrs no stents Dr. Verneda Skill   CATARACT EXTRACTION     Webster/LEFT HEART CATH AND CORONARY ANGIOGRAPHY N/A 04/26/2022   Procedure: Webster/LEFT HEART CATH AND CORONARY ANGIOGRAPHY;  Surgeon: Wellington Hampshire, MD;  Location: [facility];  Service: Cardiovascular;  Laterality: N/A;   THORACOLUMBAR SYRINGO SHUNT     TONSILLECTOMY AND ADENOIDECTOMY     Family History  Problem Relation Age of Onset   Heart disease Mother        died from MI   Heart attack Mother    Heart disease Sister    Sudden death Brother        shot and beaten to death during home invasion   Stroke Sister    Sudden death Sister        MVA   Cancer Brother        died from lung cancer   Sudden death Sister        42 days old   Cancer Brother        died from lung cancer   Stroke Brother        cause of death   Diabetes Father    Stroke Father    Pneumonia Father    Social History   Socioeconomic History   Marital status: Widowed    Spouse name: Not on file   Number of children: 2   Years of education: Not on file   Highest education level: 8th grade  Occupational History   Occupation: retired  Tobacco Use   Smoking status: Never   Smokeless tobacco: Never  Vaping Use   Vaping Use: Never used  Substance and Sexual Activity   Alcohol use: No    Alcohol/week: 0.0 standard drinks of alcohol   Drug use: No   Sexual activity: Not Currently  Other Topics Concern    Not on file  Social History Narrative   Lives with daughter, Jasmine Webster   Social Determinants of Health   Financial Resource Strain: Low Risk  (09/11/2022)   Overall Financial Resource Strain (CARDIA)    Difficulty of Paying Living Expenses: Not hard at all  Food Insecurity: No Food Insecurity (09/11/2022)  Hunger Vital Sign    Worried About Running Out of Food in the Last Year: Never true    Ran Out of Food in the Last Year: Never true  Transportation Needs: No Transportation Needs (09/11/2022)   PRAPARE - Hydrologist (Medical): No    Lack of Transportation (Non-Medical): No  Physical Activity: Insufficiently Active (09/11/2022)   Exercise Vital Sign    Days of Exercise per Week: 2 days    Minutes of Exercise per Session: 20 min  Stress: No Stress Concern Present (09/11/2022)   [place]    Feeling of Stress : Not at all  Social Connections: Moderately Isolated (09/11/2022)   Social Connection and Isolation Panel [NHANES]    Frequency of Communication with Friends and Family: More than three times a week    Frequency of Social Gatherings with Friends and Family: Twice a week    Attends Religious Services: More than 4 times per year    Active Member of [employer] or Organizations: No    Attends Archivist Meetings: Never    Marital Status: Widowed    Tobacco Counseling Counseling given: Not Answered   Clinical Intake:  Pre-visit preparation completed: Yes  Pain : No/denies pain     Nutritional Risks: None Diabetes: Yes CBG done?: No Did pt. bring in CBG monitor from home?: No  How often do you need to have someone help you when you read instructions, pamphlets, or other written materials from your doctor or pharmacy?: 1 - Never  Diabetic?yes Nutrition Risk Assessment:  Has the patient had any N/V/D within the last 2 months?  Yes  Does the patient have any  non-healing wounds?  No  Has the patient had any unintentional weight loss or weight gain?  No   Diabetes:  Is the patient diabetic?  Yes  If diabetic, was a CBG obtained today?  No  Did the patient bring in their glucometer from home?  No  How often do you monitor your CBG's? Every day.   Financial Strains and Diabetes Management:  Are you having any financial strains with the device, your supplies or your medication? No .  Does the patient want to be seen by Chronic Care Management for management of their diabetes?  No  Would the patient like to be referred to a Nutritionist or for Diabetic Management?  No   Diabetic Exams:  Diabetic Eye Exam: Completed 06/25/18. Overdue for diabetic eye exam. Pt has been advised about the importance in completing this exam.  Diabetic Foot Exam: Completed 12/20/21. Pt has been advised about the importance in completing this exam.   Interpreter Needed?: No  Information entered by :: Kirke Shaggy, LPN   Activities of Daily Living    09/11/2022   10:39 AM 05/22/2022    9:00 AM  In your present state of health, do you have any difficulty performing the following activities:  Hearing? 0 0  Vision? 0 0  Difficulty concentrating or making decisions? 0 0  Walking or climbing stairs? 1 0  Dressing or bathing? 0 0  Doing errands, shopping? 0 0  Preparing Food and eating ? N   Using the Toilet? N   In the past six months, have you accidently leaked urine? N   Do you have problems with loss of bowel control? N   Managing your Medications? N   Managing your Finances? N   Housekeeping or managing  your Housekeeping? N     Patient Care Team: Jerrol Banana., MD as PCP - General (Family Medicine) Rockey Situ Kathlene November, MD as PCP - Cardiology (Cardiology) Birder Robson, MD as Referring Physician (Ophthalmology) Gardiner Barefoot, DPM as Consulting Physician (Podiatry) Rockey Situ, Kathlene November, MD as Consulting Physician (Cardiology)  Indicate any  recent Medical Services you may have received from other than [facility] providers in the past year (date may be approximate).     Assessment:   This is a routine wellness examination for [place].  Hearing/Vision screen Hearing Screening - Comments:: No aids Vision Screening - Comments:: Wears glasses- [facility]  Dietary issues and exercise activities discussed: Current Exercise Habits: Home exercise routine, Type of exercise: walking, Time (Minutes): 20, Frequency (Times/Week): 2, Weekly Exercise (Minutes/Week): 40, Intensity: Mild   Goals Addressed             This Visit's Progress    DIET - EAT MORE FRUITS AND VEGETABLES         Depression Screen    09/11/2022   10:37 AM 07/03/2022    1:55 PM 03/28/2022    3:23 PM 01/14/2022    2:30 PM 08/11/2021   10:47 AM 07/12/2020    3:25 PM 07/06/2018    1:50 PM  PHQ 2/9 Scores  PHQ - 2 Score 0 0 0 0 0 0 0  PHQ- 9 Score 0   1       Fall Risk    09/11/2022   10:39 AM 03/28/2022    3:23 PM 01/14/2022    2:29 PM 08/11/2021   10:47 AM 07/12/2020    3:28 PM  Fall Risk   Falls in the past year? 0 0 0 0 1  Number falls in past yr: 0 0  0 0  Injury with Fall? 0 0  0 0  Risk for fall due to : No Fall Risks   Impaired balance/gait;Orthopedic patient;Impaired mobility   Follow up Falls prevention discussed;Falls evaluation completed   Falls prevention discussed Falls prevention discussed    FALL RISK PREVENTION PERTAINING TO THE HOME:  Any stairs in or around the home? Yes  If so, are there any without handrails? No  Home free of loose throw rugs in walkways, pet beds, electrical cords, etc? Yes  Adequate lighting in your home to reduce risk of falls? Yes   ASSISTIVE DEVICES UTILIZED TO PREVENT FALLS:  Life alert? No  Use of a cane, walker or w/c? Yes  Grab bars in the bathroom? No  Shower chair or bench in shower? Yes  Elevated toilet seat or a handicapped toilet? No   Cognitive Function:        09/11/2022   10:40 AM  08/11/2021   10:49 AM 09/06/2016   11:08 AM  6CIT Screen  What Year? 0 points 0 points 0 points  What month? 0 points 0 points 0 points  What time? 0 points 0 points 0 points  Count back from 20 0 points 0 points 0 points  Months in reverse 0 points 0 points 4 points  Repeat phrase 0 points 0 points 6 points  Total Score 0 points 0 points 10 points    Immunizations Immunization History  Administered Date(s) Administered   Fluad Quad(high Dose 65+) 08/08/2022   Influenza, High Dose Seasonal PF 09/04/2015, 08/29/2016, 08/02/2018, 08/12/2019, 08/30/2021   Influenza-Unspecified 09/18/2013, 08/16/2017   PFIZER(Purple Top)SARS-COV-2 Vaccination 12/02/2019, 12/23/2019, 07/19/2020   Pneumococcal Conjugate-13 12/27/2014   Pneumococcal Polysaccharide-23 07/13/1998  Td 03/29/2010    TDAP status: Due, Education has been provided regarding the importance of this vaccine. Advised may receive this vaccine at local pharmacy or Health Dept. Aware to provide a copy of the vaccination record if obtained from local pharmacy or Health Dept. Verbalized acceptance and understanding.  Flu Vaccine status: Up to date  Pneumococcal vaccine status: Up to date  Covid-19 vaccine status: Completed vaccines  Qualifies for Shingles Vaccine? Yes   Zostavax completed Yes   Shingrix Completed?: No.    Education has been provided regarding the importance of this vaccine. Patient has been advised to call insurance company to determine out of pocket expense if they have not yet received this vaccine. Advised may also receive vaccine at local pharmacy or Health Dept. Verbalized acceptance and understanding.  Screening Tests Health Maintenance  Topic Date Due   Zoster Vaccines- Shingrix (1 of 2) Never done   OPHTHALMOLOGY EXAM  06/26/2019   TETANUS/TDAP  03/29/2020   COVID-19 Vaccine (4 - Pfizer risk series) 09/13/2020   HEMOGLOBIN A1C  10/12/2022   FOOT EXAM  12/20/2022   Medicare Annual Wellness (AWV)   10/12/2023   Pneumonia Vaccine 10+ Years old  Completed   INFLUENZA VACCINE  Completed   DEXA SCAN  Completed   HPV VACCINES  Aged Out    Health Maintenance  Health Maintenance Due  Topic Date Due   Zoster Vaccines- Shingrix (1 of 2) Never done   OPHTHALMOLOGY EXAM  06/26/2019   TETANUS/TDAP  03/29/2020   COVID-19 Vaccine (4 - Pfizer risk series) 09/13/2020    Colorectal cancer screening: No longer required.   Mammogram status: No longer required due to age.    Lung Cancer Screening: (Low Dose CT Chest recommended if Age 26-80 years, 30 pack-year currently smoking OR have quit w/in 15years.) does not qualify.   Additional Screening:  Hepatitis C Screening: does not qualify; Completed no  Vision Screening: Recommended annual ophthalmology exams for early detection of glaucoma and other disorders of the eye. Is the patient up to date with their annual eye exam?  Yes  Who is the provider or what is the name of the office in which the patient attends annual eye exams? [facility] If pt is not established with a provider, would they like to be referred to a provider to establish care? No .   Dental Screening: Recommended annual dental exams for proper oral hygiene  Community Resource Referral / Chronic Care Management: CRR required this visit?  No   CCM required this visit?  No      Plan:     I have personally reviewed and noted the following in the patient's chart:   Medical and social history Use of alcohol, tobacco or illicit drugs  Current medications and supplements including opioid prescriptions. Patient is not currently taking opioid prescriptions. Functional ability and status Nutritional status Physical activity Advanced directives List of other physicians Hospitalizations, surgeries, and ER visits in previous 12 months Vitals Screenings to include cognitive, depression, and falls Referrals and appointments  In addition, I have reviewed and discussed  with patient certain preventive protocols, quality metrics, and best practice recommendations. A written personalized care plan for preventive services as well as general preventive health recommendations were provided to patient.     Jasmine David, LPN   07/37/1062   Nurse Notes: none

## 2022-10-02 ENCOUNTER — Ambulatory Visit (INDEPENDENT_AMBULATORY_CARE_PROVIDER_SITE_OTHER): Payer: PPO | Admitting: Family Medicine

## 2022-10-02 ENCOUNTER — Encounter: Payer: Self-pay | Admitting: Family Medicine

## 2022-10-02 ENCOUNTER — Other Ambulatory Visit: Payer: Self-pay

## 2022-10-02 VITALS — BP 146/73 | HR 66 | Temp 97.7°F | Resp 16 | Wt 148.1 lb

## 2022-10-02 DIAGNOSIS — E1159 Type 2 diabetes mellitus with other circulatory complications: Secondary | ICD-10-CM | POA: Diagnosis not present

## 2022-10-02 DIAGNOSIS — D500 Iron deficiency anemia secondary to blood loss (chronic): Secondary | ICD-10-CM

## 2022-10-02 DIAGNOSIS — D631 Anemia in chronic kidney disease: Secondary | ICD-10-CM | POA: Diagnosis not present

## 2022-10-02 DIAGNOSIS — N183 Chronic kidney disease, stage 3 unspecified: Secondary | ICD-10-CM

## 2022-10-02 DIAGNOSIS — E039 Hypothyroidism, unspecified: Secondary | ICD-10-CM | POA: Diagnosis not present

## 2022-10-02 DIAGNOSIS — I10 Essential (primary) hypertension: Secondary | ICD-10-CM | POA: Diagnosis not present

## 2022-10-02 DIAGNOSIS — E1122 Type 2 diabetes mellitus with diabetic chronic kidney disease: Secondary | ICD-10-CM | POA: Diagnosis not present

## 2022-10-02 DIAGNOSIS — E7800 Pure hypercholesterolemia, unspecified: Secondary | ICD-10-CM

## 2022-10-02 DIAGNOSIS — N1832 Chronic kidney disease, stage 3b: Secondary | ICD-10-CM | POA: Diagnosis not present

## 2022-10-02 DIAGNOSIS — Z794 Long term (current) use of insulin: Secondary | ICD-10-CM

## 2022-10-02 MED ORDER — INSULIN GLARGINE-YFGN 100 UNIT/ML ~~LOC~~ SOPN: PEN_INJECTOR | SUBCUTANEOUS | 2 refills | Status: DC

## 2022-10-02 MED ORDER — BD PEN NEEDLE SHORT U/F 31G X 8 MM MISC: 6 refills | Status: DC

## 2022-10-02 NOTE — Progress Notes (Signed)
I,Jasmine Webster,acting as a scribe for [employer], MD.,have documented all relevant documentation on the behalf of Jasmine Foster, MD,as directed by  Jasmine Foster, MD while in the presence of Jasmine Foster, MD.   Established patient visit   Patient: Jasmine Webster   DOB: 11-Mar-1926   88 y.o. Female  MRN: [id number] Visit Date: 10/02/2022  Today's healthcare provider: Eulis Foster, MD   Chief Complaint  Patient presents with   Follow-Up HTN and DM   Subjective    HPI  Hypertension, follow-up  BP Readings from Last 3 Encounters:  10/02/22 (!) 146/73  08/22/22 (!) 126/42  08/08/22 (!) 174/62   Wt Readings from Last 3 Encounters:  10/02/22 148 lb 1.6 oz (67.2 kg)  09/11/22 151 lb (68.5 kg)  08/22/22 151 lb (68.5 kg)     She was last seen for hypertension 2 months ago.  BP at that visit was 174/62. Management since that visit includes Consider increasing lisinopril from 10-to 20 mg. Patient taking Lisinopril 10 mg.  She reports excellent compliance with treatment. She is not having side effects.   Outside blood pressures are 140's/60-50 Symptoms: No chest pain No chest pressure  No palpitations No syncope  No dyspnea No orthopnea  No paroxysmal nocturnal dyspnea No lower extremity edema   Pertinent labs Lab Results  Component Value Date   CHOL 230 (H) 10/02/2022   HDL 46 10/02/2022   LDLCALC 141 (H) 10/02/2022   TRIG 237 (H) 10/02/2022   CHOLHDL 5.0 (H) 10/02/2022   Lab Results  Component Value Date   NA 140 10/02/2022   K 5.0 10/02/2022   CREATININE 1.16 (H) 10/02/2022   GFRNONAA 34 (L) 06/26/2022   GLUCOSE 164 (H) 10/02/2022   TSH 2.510 10/02/2022     The ASCVD Risk score (Arnett DK, et al., 2019) failed to calculate for the following reasons:   The 2019 ASCVD risk score is only valid for ages 58 to 32   The patient has a prior MI or stroke  diagnosis  ---------------------------------------------------------------------------------------------------  Diabetes Mellitus Type II, Follow-up  Lab Results  Component Value Date   HGBA1C 6.4 (H) 10/02/2022   HGBA1C 7.4 (A) 04/11/2022   HGBA1C 6.7 (H) 11/26/2021   Wt Readings from Last 3 Encounters:  10/02/22 148 lb 1.6 oz (67.2 kg)  09/11/22 151 lb (68.5 kg)  08/22/22 151 lb (68.5 kg)   Last seen for diabetes 6 months ago.  Management since then includes continue current treatment. Semglee, YFGN 18 units once a day. She reports excellent compliance with treatment. She is not having side effects.   Symptoms: No fatigue No foot ulcerations  No appetite changes No nausea  No paresthesia of the feet  No polydipsia  No polyuria No visual disturbances   No vomiting     Home blood sugar records: fasting range: 98-140  Episodes of hypoglycemia? No    Most Recent Eye Exam: 2 month ago   Pertinent Labs: Lab Results  Component Value Date   CHOL 230 (H) 10/02/2022   HDL 46 10/02/2022   LDLCALC 141 (H) 10/02/2022   TRIG 237 (H) 10/02/2022   CHOLHDL 5.0 (H) 10/02/2022   Lab Results  Component Value Date   NA 140 10/02/2022   K 5.0 10/02/2022   CREATININE 1.16 (H) 10/02/2022   GFRNONAA 34 (L) 06/26/2022   MICROALBUR 20 06/02/2017      Anemia  in setting of CKD  Patient previously requires iron infusion for  Iron deficiency anemia, follows with heme/onc  Last infusion was 08/22/22 Both she and her daughter are requesting following up of CBC studies for anemia  Denies any obvious signs of bleeding at this time including hemoptysis, hematochezia/melena   ---------------------------------------------------------------------------------------------------  Medications: Outpatient Medications Prior to Visit  Medication Sig   ALPRAZolam (XANAX) 0.5 MG tablet TAKE 1 TABLET BY MOUTH EVERY DAY AT BEDTIME AS NEEDED   Artificial Tear Ointment (DRY EYES OP) Apply 1 drop to eye  daily.   aspirin EC 81 MG tablet Take 81 mg by mouth daily.   co-enzyme Q-10 30 MG capsule Take 30 mg by mouth daily.   ezetimibe (ZETIA) 10 MG tablet TAKE 1 TABLET BY MOUTH EVERY DAY   furosemide (LASIX) 20 MG tablet Take 1 tablet (20 mg total) by mouth every other day.   lisinopril (ZESTRIL) 10 MG tablet TAKE 1 TABLET BY MOUTH EVERY DAY   Magnesium 400 MG CAPS Take 400 mg by mouth daily.   Multiple Vitamins-Minerals (PRESERVISION AREDS) CAPS Take 1 capsule by mouth 2 (two) times daily.    nitroGLYCERIN (NITROSTAT) 0.4 MG SL tablet Place 1 tablet (0.4 mg total) under the tongue every 5 (five) minutes as needed for chest pain.   Omega-3 Fatty Acids (FISH OIL PO) Take by mouth daily.   omeprazole (PRILOSEC) 40 MG capsule TAKE 1 CAPSULE (40 MG TOTAL) BY MOUTH DAILY.   ONETOUCH ULTRA test strip CHECK SUGAR TWICE A DAY   pantoprazole (PROTONIX) 40 MG tablet Take 1 tablet (40 mg total) by mouth 2 (two) times daily before a meal.   tobramycin (TOBREX) 0.3 % ophthalmic solution Place into the left eye.   Turmeric 500 MG CAPS Take by mouth daily.   vitamin B-12 (CYANOCOBALAMIN) 500 MCG tablet Take 500 mcg by mouth once a week.   [DISCONTINUED] insulin glargine-yfgn (SEMGLEE, YFGN,) 100 UNIT/ML Pen INJECT 25 UNITS SUBCUTANEOUSLY (Patient taking differently: INJECT 18 UNITS SUBCUTANEOUSLY)   [DISCONTINUED] SEMGLEE, YFGN, 100 UNIT/ML Pen 18 Units daily.   [DISCONTINUED] ondansetron (ZOFRAN) 4 MG tablet TAKE 1 TABLET BY MOUTH EVERY 8 HOURS AS NEEDED FOR NAUSEA AND VOMITING   [DISCONTINUED] potassium chloride (KLOR-CON) 10 MEQ tablet TAKE 1 TABLET BY MOUTH 2-3 TIMES A WEEK WITH FUROSEMIDE AS DIRECTED   No facility-administered medications prior to visit.    Review of Systems     Objective    BP (!) 146/73 (BP Location: Left Arm, Patient Position: Sitting, Cuff Size: Normal)   Pulse 66   Temp 97.7 F (36.5 C) (Oral)   Resp 16   Wt 148 lb 1.6 oz (67.2 kg)   BMI 25.03 kg/m    Physical  Exam Vitals reviewed.  Constitutional:      General: She is not in acute distress.    Appearance: Normal appearance. She is not ill-appearing, toxic-appearing or diaphoretic.  Eyes:     Conjunctiva/sclera: Conjunctivae normal.  Cardiovascular:     Rate and Rhythm: Normal rate and regular rhythm.     Pulses: Normal pulses.     Heart sounds: Normal heart sounds. No murmur heard.    No friction rub. No gallop.  Pulmonary:     Effort: Pulmonary effort is normal. No respiratory distress.     Breath sounds: Normal breath sounds. No stridor. No wheezing, rhonchi or rales.  Abdominal:     General: Bowel sounds are normal. There is no distension.     Palpations: Abdomen is soft.     Tenderness: There is no abdominal tenderness.  Musculoskeletal:     Right lower leg: No edema.     Left lower leg: No edema.  Skin:    Findings: No erythema or rash.  Neurological:     Mental Status: She is alert and oriented to person, place, and time.      Results for orders placed or performed in visit on 10/02/22  CBC  Result Value Ref Range   WBC 5.7 3.4 - 10.8 x10E3/uL   RBC 3.79 3.77 - 5.28 x10E6/uL   Hemoglobin 11.6 11.1 - 15.9 g/dL   Hematocrit 36.2 34.0 - 46.6 %   MCV 96 79 - 97 fL   MCH 30.6 26.6 - 33.0 pg   MCHC 32.0 31.5 - 35.7 g/dL   RDW 13.3 11.7 - 15.4 %   Platelets CANCELED x10E3/uL   Hematology Comments: Note:   Ferritin  Result Value Ref Range   Ferritin 115 15 - 150 ng/mL  Comprehensive metabolic panel  Result Value Ref Range   Glucose 164 (H) 70 - 99 mg/dL   BUN 24 10 - 36 mg/dL   Creatinine, Ser 1.16 (H) 0.57 - 1.00 mg/dL   eGFR 44 (L) >59 mL/min/1.73   BUN/Creatinine Ratio 21 12 - 28   Sodium 140 134 - 144 mmol/L   Potassium 5.0 3.5 - 5.2 mmol/L   Chloride 102 96 - 106 mmol/L   CO2 24 20 - 29 mmol/L   Calcium 9.3 8.7 - 10.3 mg/dL   Total Protein 6.1 6.0 - 8.5 g/dL   Albumin 4.1 3.6 - 4.6 g/dL   Globulin, Total 2.0 1.5 - 4.5 g/dL   Albumin/Globulin Ratio 2.1 1.2 -  2.2   Bilirubin Total 0.3 0.0 - 1.2 mg/dL   Alkaline Phosphatase 134 (H) 44 - 121 IU/L   AST 18 0 - 40 IU/L   ALT 13 0 - 32 IU/L  Hemoglobin A1c  Result Value Ref Range   Hgb A1c MFr Bld 6.4 (H) 4.8 - 5.6 %   Est. average glucose Bld gHb Est-mCnc 137 mg/dL  Lipid Profile  Result Value Ref Range   Cholesterol, Total 230 (H) 100 - 199 mg/dL   Triglycerides 237 (H) 0 - 149 mg/dL   HDL 46 >39 mg/dL   VLDL Cholesterol Cal 43 (H) 5 - 40 mg/dL   LDL Chol Calc (NIH) 141 (H) 0 - 99 mg/dL   Chol/HDL Ratio 5.0 (H) 0.0 - 4.4 ratio  TSH + free T4  Result Value Ref Range   TSH 2.510 0.450 - 4.500 uIU/mL   Free T4 0.95 0.82 - 1.77 ng/dL    Assessment & Plan     Problem List Items Addressed This Visit       Cardiovascular and Mediastinum   Benign essential HTN - Primary    Stable  A1c repeated, given advanced age would be reasonable to consider goal < 8.0 She will continue Semglee 18 units  Refills provided as requested for BG monitoring supplies  She is on lisinopril and zetia       Relevant Orders   Comprehensive metabolic panel (Completed)     Endocrine   Adult hypothyroidism    Stable  Will check TSH and free T4 today  No current medication for this       Relevant Orders   Comprehensive metabolic panel (Completed)   Type 2 diabetes mellitus with diabetic chronic kidney disease (HCC)   Relevant Medications   insulin glargine-yfgn (SEMGLEE, YFGN,) 100 UNIT/ML Pen   Insulin Pen Needle (  B-D ULTRAFINE III SHORT PEN) 31G X 8 MM MISC   Other Relevant Orders   Hemoglobin A1c (Completed)   Lipid Profile (Completed)     Genitourinary   Anemia in chronic kidney disease (CKD)    Follows with heme/onc s/p transfusion for anemia, severe  Patient and daughter request repeat CBC and will also order ferritin  Last hgb was 11.2       Relevant Orders   CBC (Completed)   Ferritin (Completed)     Other   Iron deficiency anemia (Chronic)    Receives iron infusions  Followed by  Dr. Talbert Cage with Heme/Onc  Will check CBC and ferritin today        Relevant Orders   CBC (Completed)   Ferritin (Completed)   Hypercholesteremia    Stable  Unable to tolerate statins however continues Zetia 66m  Will check lipid panel today        Relevant Orders   Lipid Profile (Completed)   Other Visit Diagnoses     Controlled type 2 diabetes mellitus with other circulatory complication, unspecified whether long term insulin use (HCC)       Relevant Medications   insulin glargine-yfgn (SEMGLEE, YFGN,) 100 UNIT/ML Pen   Other Relevant Orders   Hemoglobin A1c (Completed)       Return in about 6 months (around 04/02/2023) for HTN, anemia.      I, MEulis Foster MD, have reviewed all documentation for this visit. The documentation on 10/05/22 for the exam, diagnosis, procedures, and orders are all accurate and complete.  Portions of this information were initially documented by the CMA and reviewed by me for thoroughness and accuracy.      MEulis Foster MD  [phone](phone) [phone](fax)  [facility]

## 2022-10-03 ENCOUNTER — Other Ambulatory Visit: Payer: Self-pay | Admitting: Cardiovascular Disease

## 2022-10-03 LAB — CBC
Hematocrit: 36.2 % (ref 34.0–46.6)
Hemoglobin: 11.6 g/dL (ref 11.1–15.9)
MCH: 30.6 pg (ref 26.6–33.0)
MCHC: 32 g/dL (ref 31.5–35.7)
MCV: 96 fL (ref 79–97)
RBC: 3.79 x10E6/uL (ref 3.77–5.28)
RDW: 13.3 % (ref 11.7–15.4)
WBC: 5.7 10*3/uL (ref 3.4–10.8)

## 2022-10-03 LAB — COMPREHENSIVE METABOLIC PANEL
ALT: 13 IU/L (ref 0–32)
AST: 18 IU/L (ref 0–40)
Albumin/Globulin Ratio: 2.1 (ref 1.2–2.2)
Albumin: 4.1 g/dL (ref 3.6–4.6)
Alkaline Phosphatase: 134 IU/L — ABNORMAL HIGH (ref 44–121)
BUN/Creatinine Ratio: 21 (ref 12–28)
BUN: 24 mg/dL (ref 10–36)
Bilirubin Total: 0.3 mg/dL (ref 0.0–1.2)
CO2: 24 mmol/L (ref 20–29)
Calcium: 9.3 mg/dL (ref 8.7–10.3)
Chloride: 102 mmol/L (ref 96–106)
Creatinine, Ser: 1.16 mg/dL — ABNORMAL HIGH (ref 0.57–1.00)
Globulin, Total: 2 g/dL (ref 1.5–4.5)
Glucose: 164 mg/dL — ABNORMAL HIGH (ref 70–99)
Potassium: 5 mmol/L (ref 3.5–5.2)
Sodium: 140 mmol/L (ref 134–144)
Total Protein: 6.1 g/dL (ref 6.0–8.5)
eGFR: 44 mL/min/{1.73_m2} — ABNORMAL LOW (ref >59)

## 2022-10-03 LAB — TSH+FREE T4
Free T4: 0.95 ng/dL (ref 0.82–1.77)
TSH: 2.51 u[IU]/mL (ref 0.450–4.500)

## 2022-10-03 LAB — LIPID PANEL
Chol/HDL Ratio: 5 ratio — ABNORMAL HIGH (ref 0.0–4.4)
Cholesterol, Total: 230 mg/dL — ABNORMAL HIGH (ref 100–199)
HDL: 46 mg/dL (ref >39)
LDL Chol Calc (NIH): 141 mg/dL — ABNORMAL HIGH (ref 0–99)
Triglycerides: 237 mg/dL — ABNORMAL HIGH (ref 0–149)
VLDL Cholesterol Cal: 43 mg/dL — ABNORMAL HIGH (ref 5–40)

## 2022-10-03 LAB — HEMOGLOBIN A1C
Est. average glucose Bld gHb Est-mCnc: 137 mg/dL
Hgb A1c MFr Bld: 6.4 % — ABNORMAL HIGH (ref 4.8–5.6)

## 2022-10-03 LAB — FERRITIN: Ferritin: 115 ng/mL (ref 15–150)

## 2022-10-04 ENCOUNTER — Telehealth: Payer: Self-pay

## 2022-10-04 NOTE — Telephone Encounter (Signed)
Copied from [place] [id number]. Topic: General - Inquiry >> Oct 04, 2022 10:49 AM Penni Bombard wrote: eason for CRM: pt would like a call back about her labs.  She said there were a lot of abnormals and she would like to talk about it.  CB#  [phone]

## 2022-10-04 NOTE — Telephone Encounter (Signed)
Please see lab results. Patient advised

## 2022-10-05 MED ORDER — BD PEN NEEDLE SHORT U/F 31G X 8 MM MISC: 6 refills | Status: DC

## 2022-10-05 MED ORDER — INSULIN GLARGINE-YFGN 100 UNIT/ML ~~LOC~~ SOPN: PEN_INJECTOR | SUBCUTANEOUS | 2 refills | Status: DC

## 2022-10-05 NOTE — Assessment & Plan Note (Addendum)
Stable  A1c repeated, given advanced age would be reasonable to consider goal < 8.0 She will continue Semglee 18 units  Refills provided as requested for BG monitoring supplies  She is on lisinopril and zetia

## 2022-10-05 NOTE — Assessment & Plan Note (Signed)
Receives iron infusions  Followed by Dr. Talbert Cage with Heme/Onc  Will check CBC and ferritin today

## 2022-10-05 NOTE — Assessment & Plan Note (Signed)
Stable  Unable to tolerate statins however continues Zetia '10mg'$   Will check lipid panel today

## 2022-10-05 NOTE — Assessment & Plan Note (Signed)
Follows with heme/onc s/p transfusion for anemia, severe  Patient and daughter request repeat CBC and will also order ferritin  Last hgb was 11.2

## 2022-10-05 NOTE — Assessment & Plan Note (Signed)
Stable  Will check TSH and free T4 today  No current medication for this

## 2022-10-18 NOTE — Progress Notes (Deleted)
Cardiology Office Note    Date:  10/18/2022   ID:  Jasmine Webster, DOB 1926-03-14, MRN [id number]  PCP:  Jasmine Foster, MD  Cardiologist:  Jasmine Rogue, MD  Electrophysiologist:  None   Chief Complaint: Follow up  History of Present Illness:   Jasmine Webster is a 88 y.o. female with history of nonobstructive CAD by [place] in 04/2022, symptomatic anemia requiring PRBC transfusion, severe aortic stenosis, LBBB, DM2, pulmonary venous hypertension, HTN, and HLD who presents for follow-up of ***.  She has been followed for aortic valve disease.  Echo in 01/2019 demonstrated an EF of 60 to 86%, diastolic dysfunction, normal RV systolic function and ventricular cavity size, estimated PASP 62 mmHg, and moderate aortic stenosis with a mean gradient of 26 mmHg.  Coronary CTA in 06/2021 showed a calcium score of 80.21 nonobstructive proximal LAD and LCx disease.  Echo in 07/2021 showed an EF of 50 to 55%, no regional wall motion abnormalities, grade 2 diastolic dysfunction, normal RV systolic function and ventricular cavity size, mild mitral regurgitation, moderate aortic stenosis with a mean gradient of 29 mmHg.  She was evaluated in 04/2022 with chest tightness with exertion and worsening dyspnea.  R/LHC on 04/26/2022 showed left dominant coronary arteries with moderate nonobstructive one-vessel CAD involving the ramus estimated at 60% with otherwise no evidence of obstructive disease.  RHC showed normal RA pressure moderately elevated blood pressure, severe pulmonary hypertension, and normal cardiac output.  Prominent V waves on pulmonary wedge tracings were suggestive of mitral regurgitation.  There was severe aortic stenosis with a mean gradient of 40 mmHg.  She was evaluated by the structural heart team later in 04/2022.  She was admitted to the hospital in 05/2022 with symptomatic anemia with a hemoglobin of 6 requiring 2 units PRBC with admission complicated by demand ischemia with troponin of  4000.  Following this, she completed evaluation for TAVR and was found to not have anatomy for TAVR.  She was last seen in the office in 05/2022 and was without symptoms of angina or decompensation.  ***   Labs independently reviewed: 09/2022 - TSH normal, TC 230, TG 237, HDL 46, LDL 141, A1c 6.4, BUN 24, serum creatinine 1.16, potassium 5.0, albumin 4.1, AST/ALT normal, Hgb 11.6 08/2022 - PLT 186  Past Medical History:  Diagnosis Date   Arthritis    Cataract    cataract removal bilaterally approx 20 years ago   Chronic back pain    Coronary artery disease    COVID-19 virus infection 12/2020   Diabetes mellitus without complication (HCC)    Heart murmur    Hyperlipidemia    Hypertension     Past Surgical History:  Procedure Laterality Date   ABDOMINAL HYSTERECTOMY     APPENDECTOMY     BACK SURGERY     three   CARDIAC CATHETERIZATION     6+yrs no stents Dr. Verneda Skill   CATARACT EXTRACTION     Webster/LEFT HEART CATH AND CORONARY ANGIOGRAPHY N/A 04/26/2022   Procedure: Webster/LEFT HEART CATH AND CORONARY ANGIOGRAPHY;  Surgeon: Wellington Hampshire, MD;  Location: [facility];  Service: Cardiovascular;  Laterality: N/A;   THORACOLUMBAR [place] AND ADENOIDECTOMY      Current Medications: No outpatient medications have been marked as taking for the 10/22/22 encounter (Appointment) with Rise Mu, PA-C.    Allergies:   Etodolac, Naproxen, Statins, Latex, Terbinafine, and Terbinafine hcl   Social History  Socioeconomic History   Marital status: Widowed    Spouse name: Not on file   Number of children: 2   Years of education: Not on file   Highest education level: 8th grade  Occupational History   Occupation: retired  Tobacco Use   Smoking status: Never   Smokeless tobacco: Never  Vaping Use   Vaping Use: Never used  Substance and Sexual Activity   Alcohol use: No    Alcohol/week: 0.0 standard drinks of alcohol   Drug use: No    Sexual activity: Not Currently  Other Topics Concern   Not on file  Social History Narrative   Lives with daughter, Jasmine Webster   Social Determinants of Health   Financial Resource Strain: Low Risk  (09/11/2022)   Overall Financial Resource Strain (CARDIA)    Difficulty of Paying Living Expenses: Not hard at all  Food Insecurity: No Food Insecurity (09/11/2022)   Hunger Vital Sign    Worried About Running Out of Food in the Last Year: Never true    Olds in the Last Year: Never true  Transportation Needs: No Transportation Needs (09/11/2022)   PRAPARE - Hydrologist (Medical): No    Lack of Transportation (Non-Medical): No  Physical Activity: Insufficiently Active (09/11/2022)   Exercise Vital Sign    Days of Exercise per Week: 2 days    Minutes of Exercise per Session: 20 min  Stress: No Stress Concern Present (09/11/2022)   [place]    Feeling of Stress : Not at all  Social Connections: Moderately Isolated (09/11/2022)   Social Connection and Isolation Panel [NHANES]    Frequency of Communication with Friends and Family: More than three times a week    Frequency of Social Gatherings with Friends and Family: Twice a week    Attends Religious Services: More than 4 times per year    Active Member of [employer] or Organizations: No    Attends Archivist Meetings: Never    Marital Status: Widowed     Family History:  The patient's family history includes Cancer in her brother and brother; Diabetes in her father; Heart attack in her mother; Heart disease in her mother and sister; Pneumonia in her father; Stroke in her brother, father, and sister; Sudden death in her brother, sister, and sister.  ROS:   ROS   EKGs/Labs/Other Studies Reviewed:    Studies reviewed were summarized above. The additional studies were reviewed today:  2D echo 05/22/2022: 1. Left  ventricular ejection fraction, by estimation, is 55 to 60%. The  left ventricle has normal function. The left ventricle has no regional  wall motion abnormalities. There is mild left ventricular hypertrophy.  Left ventricular diastolic parameters  are consistent with Grade II diastolic dysfunction (pseudonormalization).   2. Webster ventricular systolic function is normal. The Webster ventricular  size is normal.   3. The mitral valve is degenerative. Mild mitral valve regurgitation.  Mild to moderate mitral stenosis. The mean mitral valve gradient is 6.0  mmHg. Moderate to severe mitral annular calcification.   4. Mean aortic valve gradient 53mHg, peak gradient 659mg, Vmax 4 m/s,  AVA 0.6cm2, DVI 0.19. The aortic valve is calcified. Aortic valve  regurgitation is not visualized. Severe aortic valve stenosis.  __________  Coronary CTA with morphology 05/16/2022: FINDINGS: Aortic Root:   Aortic valve: Trileaflet   Aortic valve calcium score: 1424   Aortic annulus:  Diameter: 33m x 148m  Perimeter: 5885m Area: 260 mm^2   Calcifications: No calcifications   Coronary height: Min Left - 37m3max Left - 14mm52mn Webster - 12mm 75mnotubular height: Left cusp - 18mm; 39mt cusp - 15mm; N35mronary cusp - 15mm   L30m(as measured 3 mm below the annulus):   Diameter: 22mmx 20m32mAre5m73 mm^2   Calcifications: No calcifications   Aortic sinus width: Left cusp - 25mm; Right8mp - 23mm; Noncor55my cusp - 23mm   Sinotu63mr junction width: 22mm x 21mm   49mmum 9mroscopic Angle for Delivery: LAO 7 CAU 19   Cardiac:   Webster atrium: Normal size   Webster ventricle: Normal size   Pulmonary arteries: Normal size   Pulmonary veins: Normal configuration   Left atrium: Mild enlargement   Left ventricle: Normal size   Pericardium: Normal thickness   Mital valve: Moderate mitral annular calcification   Coronary arteries: Coronary calcium score 117    IMPRESSION: 1. Trileaflet aortic valve with severe calcifications (AV calcium score 1424) 2. Small aortic annulus measuring 21mm x 17mm, wit75mrime25m58mm and area 260 48m. No annular or LVOT calcifications. While annular measurements are suitable for placement of a 23mm Evolut valve, 68mSinus of Valsalva diameter measures 23mm at the Webster an61mncoronary cusps, which is below the recommended minimum Sinus of Valsalva diameter (25mm) for a 23mm Evol40mAnnulus a33mis also below the recommended minimum for a 20mm Sapien 3 valve. Re80mend discussion at structural heart conference. 3.  Low coronary height to left main, measuring 37mm 4.  Optimum Fluoros71mc Angle for Delivery:  LAO 7 CAU 19 5.  Coronary calcium score 117 __________  R/LHC 04/26/2022:   [facility].   Ramus lesion is 60% stenosed.   Prox LAD to Mid LAD lesion is 30% stenosed.   1.  Left dominant coronary arteries with moderate nonobstructive one-vessel coronary artery disease involving the ramus.  No evidence of obstructive disease. 2. Webster heart catheterization showed normal RA pressure, moderately elevated wedge pressure, severe pulmonary hypertension and normal cardiac output.  Prominent V waves on pulmonary wedge tracing suggestive of mitral regurgitation. 3.  Severe aortic stenosis with mean gradient of 40 mmHg.     RA: 2 mmHg RV: 65/2 mmHg PA: 68/27 with a mean of 46 mmHg PCW: Mean pressure of 26 mmHg with prominent V waves at 38 mmHg. LVEDP: 30 mmHg. Cardiac output: 7.5 with an index of 4.3.   Recommendations: Proceed with TAVR evaluation.  No need for coronary revascularization. __________  2D echo 07/25/2021: 1. Left ventricular ejection fraction, by estimation, is 50 to 55%. The  left ventricle has low normal function. The left ventricle has no regional  wall motion abnormalities. Left ventricular diastolic parameters are  consistent with Grade II diastolic  dysfunction  (pseudonormalization).   2. Webster ventricular systolic function is normal. The Webster ventricular  size is normal. Tricuspid regurgitation signal is inadequate for assessing  PA pressure.   3. The mitral valve is degenerative. Mild mitral valve regurgitation.   4. Aortic valve mean gradient is 29mmHg, PG 51 mmHg, DVI =42m8, peak  velocity 3.61m/s. The aortic valve is67mcified. Aortic valve  regurgitation is mild. Moderate aortic valve stenosis.   5. The inferior vena cava is normal in size with greater than 50%  respiratory variability, suggesting Webster atrial pressure of 3 mmHg.  __________  Coronary CTA 07/12/2021: FINDINGS: Aorta: Normal  size. Moderate ascending and descending aorta calcifications. No dissection.   Aortic Valve:  Trileaflet.  moderate calcifications.   Coronary Arteries:  Normal coronary origin.  left dominance.   RCA is a small non dominant artery. There is no plaque.   Left main is a large artery that gives rise to LAD and LCX arteries.   LAD is a large vessel that has mild proximal calcifications causing minimal stenosis (<25%).   LCX is a dominant artery that gives rise to an OM1 branch. The LCx further gives rise to the PDA. There is mild proximal and distal LCx calcifications causing minimal stenosis.   Other findings:   Normal pulmonary vein drainage into the left atrium.   Normal left atrial appendage without a thrombus.   Normal size of the pulmonary artery.   IMPRESSION: 1. Coronary calcium score of 80.2. 2. Normal coronary origin with left dominance. 3. Minimal non obstructive proximal LAD and LCx disease. 4. CAD-RADS 1. Minimal non-obstructive CAD (0-24%). Consider non-atherosclerotic causes of chest pain. Consider preventive therapy and risk factor modification. __________  2D echo 02/23/2020: 1. Left ventricular ejection fraction, by estimation, is 55 to 60%. The  left ventricle has normal function. The left ventricle has no regional   wall motion abnormalities. There is mild left ventricular hypertrophy.  Left ventricular diastolic parameters  are consistent with Grade II diastolic dysfunction (pseudonormalization).   2. Webster ventricular systolic function is normal. The Webster ventricular  size is normal. There is moderately elevated pulmonary artery systolic  pressure.   3. The mitral valve is degenerative. Mild mitral valve regurgitation.   4. The aortic valve is tricuspid. Aortic valve regurgitation is mild.  Moderate aortic valve stenosis. Aortic valve mean gradient measures 25.8  mmHg. Aortic valve Vmax measures 3.59 m/s.   5. The inferior vena cava is normal in size with greater than 50%  respiratory variability, suggesting Webster atrial pressure of 3 mmHg.  __________  2D echo 01/27/2019: 1. The left ventricle has normal systolic function with an ejection  fraction of 60-65%. The cavity size was normal. There is mildly increased  left ventricular wall thickness. Left ventricular diastolic Doppler  parameters are consistent with impaired  relaxation.   2. The Webster ventricle has normal systolic function. The cavity was  normal. There is no increase in Webster ventricular wall thickness. Webster  ventricular systolic pressure is severely elevated with an estimated  pressure of 62.0 mmHg.   3. Tricuspid valve regurgitation is mild-moderate.   4. Mmoderate stenosis of the aortic valve. Mean gradient 26 mm Hg   EKG:  EKG is ordered today.  The EKG ordered today demonstrates ***  Recent Labs: 11/26/2021: Magnesium 2.0 05/21/2022: B Natriuretic Peptide 300.3 10/02/2022: ALT 13; BUN 24; Creatinine, Ser 1.16; Hemoglobin 11.6; Platelets CANCELED; Potassium 5.0; Sodium 140; TSH 2.510  Recent Lipid Panel    Component Value Date/Time   CHOL 230 (H) 10/02/2022 1305   TRIG 237 (H) 10/02/2022 1305   HDL 46 10/02/2022 1305   CHOLHDL 5.0 (H) 10/02/2022 1305   LDLCALC 141 (H) 10/02/2022 1305    PHYSICAL EXAM:    VS:   There were no vitals taken for this visit.  BMI: There is no height or weight on file to calculate BMI.  Physical Exam  Wt Readings from Last 3 Encounters:  10/02/22 148 lb 1.6 oz (67.2 kg)  09/11/22 151 lb (68.5 kg)  08/22/22 151 lb (68.5 kg)     ASSESSMENT & PLAN:   Severe  aortic stenosis:  Nonobstructive CAD:  Pulmonary hypertension:  HTN: Blood pressure  HLD: LDL 141 in 09/2022.  Anemia:   {Are you ordering a CV Procedure (e.g. stress test, cath, DCCV, TEE, etc)?   Press F2        :[id number]}     Disposition: F/u with Dr. Rockey Situ or an APP in ***.   Medication Adjustments/Labs and Tests Ordered: Current medicines are reviewed at length with the patient today.  Concerns regarding medicines are outlined above. Medication changes, Labs and Tests ordered today are summarized above and listed in the Patient Instructions accessible in Encounters.   Signed, Christell Faith, PA-C 10/18/2022 7:36 AM     [facility] [address] [place] [place],  [address] [phone]

## 2022-10-21 ENCOUNTER — Encounter: Payer: Self-pay | Admitting: Podiatry

## 2022-10-21 ENCOUNTER — Ambulatory Visit: Payer: PPO | Admitting: Podiatry

## 2022-10-21 VITALS — BP 160/65 | HR 72

## 2022-10-21 DIAGNOSIS — M79675 Pain in left toe(s): Secondary | ICD-10-CM | POA: Diagnosis not present

## 2022-10-21 DIAGNOSIS — M79674 Pain in right toe(s): Secondary | ICD-10-CM | POA: Diagnosis not present

## 2022-10-21 DIAGNOSIS — E119 Type 2 diabetes mellitus without complications: Secondary | ICD-10-CM

## 2022-10-21 DIAGNOSIS — B351 Tinea unguium: Secondary | ICD-10-CM

## 2022-10-21 NOTE — Progress Notes (Signed)
This patient returns to my office for at risk foot care.  This patient requires this care by a professional since this patient will be at risk due to having diabetes  This patient is unable to cut nails herself since the patient cannot reach her nails.These nails are painful walking and wearing shoes.  This patient presents for at risk foot care today.  General Appearance  Alert, conversant and in no acute stress.  Vascular  Dorsalis pedis and posterior tibial  pulses are weakly  palpable  bilaterally.  Capillary return is within normal limits  bilaterally. Temperature is within normal limits  bilaterally.  Neurologic  Senn-Weinstein monofilament wire test within normal limits  bilaterally. Muscle power within normal limits bilaterally.  Nails Thick disfigured discolored nails with subungual debris  from hallux to fifth toes bilaterally except nail plate left hallux.. No evidence of bacterial infection or drainage bilaterally.  Orthopedic  No limitations of motion  feet .  No crepitus or effusions noted.  HAV  B/L.  Hammer toes second  B/L.  Skin  normotropic skin with no porokeratosis noted bilaterally.  No signs of infections or ulcers noted.     Onychomycosis  Pain in right toes  Pain in left toes  Consent was obtained for treatment procedures.   Mechanical debridement of nails 1-5  bilaterally performed with a nail nipper.  Filed with dremel without incident.    Return office visit   4 months                   Told patient to return for periodic foot care and evaluation due to potential at risk complications.   Gardiner Barefoot DPM

## 2022-10-22 ENCOUNTER — Ambulatory Visit: Payer: PPO | Admitting: Physician Assistant

## 2022-10-30 ENCOUNTER — Other Ambulatory Visit: Payer: Self-pay | Admitting: Cardiovascular Disease

## 2022-11-06 DIAGNOSIS — Z6824 Body mass index (BMI) 24.0-24.9, adult: Secondary | ICD-10-CM | POA: Diagnosis not present

## 2022-11-06 DIAGNOSIS — E119 Type 2 diabetes mellitus without complications: Secondary | ICD-10-CM | POA: Diagnosis not present

## 2022-11-06 DIAGNOSIS — Z794 Long term (current) use of insulin: Secondary | ICD-10-CM | POA: Diagnosis not present

## 2022-11-06 DIAGNOSIS — I10 Essential (primary) hypertension: Secondary | ICD-10-CM | POA: Diagnosis not present

## 2022-11-15 NOTE — Progress Notes (Signed)
Cardiology Office Note    Date:  11/20/2022   ID:  Jasmine Webster, DOB 11-07-1926, MRN [id number]  PCP:  Ronnald Ramp, MD  Cardiologist:  Julien Nordmann, MD  Electrophysiologist:  None   Chief Complaint: Follow-up  History of Present Illness:   Jasmine Webster is a 88 y.o. female with history of nonobstructive CAD by LHC in 04/2022, symptomatic iron deficiency anemia requiring PRBC transfusion and iron infusions, severe aortic stenosis, LBBB, DM2, pulmonary venous hypertension, HTN, and HLD who presents for follow-up of aortic stenosis.  She has been followed for aortic valve disease.  Echo in 01/2019 demonstrated an EF of 60 to 65%, diastolic dysfunction, normal RV systolic function and ventricular cavity size, estimated PASP 62 mmHg, and moderate aortic stenosis with a mean gradient of 26 mmHg.  Coronary CTA in 06/2021 showed a calcium score of 80.21 nonobstructive proximal LAD and LCx disease.  Echo in 07/2021 showed an EF of 50 to 55%, no regional wall motion abnormalities, grade 2 diastolic dysfunction, normal RV systolic function and ventricular cavity size, mild mitral regurgitation, moderate aortic stenosis with a mean gradient of 29 mmHg.  She was evaluated in 04/2022 with chest tightness with exertion and worsening dyspnea.  R/LHC on 04/26/2022 showed left dominant coronary arteries with moderate nonobstructive one-vessel CAD involving the ramus estimated at 60% with otherwise no evidence of obstructive disease.  RHC showed normal RA pressure moderately elevated blood pressure, severe pulmonary hypertension, and normal cardiac output.  Prominent V waves on pulmonary wedge tracings were suggestive of mitral regurgitation.  There was severe aortic stenosis with a mean gradient of 40 mmHg.  She was evaluated by the structural heart team later in 04/2022.  She was admitted to the hospital in 05/2022 with symptomatic anemia with a hemoglobin of 6 requiring 2 units PRBC with admission  complicated by demand ischemia with troponin of 4000.  Following this, she completed evaluation for TAVR and was found to not have anatomy for TAVR.  She was last seen in the office in 05/2022 and was without symptoms of angina or decompensation.  She has been evaluated by GI with video capsule endoscopy showing no significant findings.  GI has deferred EGD/colonoscopy given comorbid conditions.  She comes in accompanied by family today and is doing well from a cardiac perspective.  No symptoms of angina or decompensation.  No dyspnea, palpitations, dizziness, presyncope, or syncope.  No significant lower extremity swelling or orthopnea.  No early satiety.  Over the past 3 to 4 days she has noted an increase in dark stools.  She also notes a slight increase in her fatigue.  No hematochezia.  She remains on aspirin, ezetimibe, and furosemide.   Labs independently reviewed: 09/2022 - TSH normal, TC 230, TG 237, HDL 46, LDL 141, A1c 6.4, BUN 24, serum creatinine 1.16, potassium 5.0, albumin 4.1, AST/ALT normal, Hgb 11.6, ferritin normal, iron normal 08/2022 - PLT 186   Past Medical History:  Diagnosis Date   Arthritis    Cataract    cataract removal bilaterally approx 20 years ago   Chronic back pain    Coronary artery disease    COVID-19 virus infection 12/2020   Diabetes mellitus without complication (HCC)    Heart murmur    Hyperlipidemia    Hypertension     Past Surgical History:  Procedure Laterality Date   ABDOMINAL HYSTERECTOMY     APPENDECTOMY     BACK SURGERY     three   CARDIAC  CATHETERIZATION     6+yrs no stents Dr. Vanita Panda   CATARACT EXTRACTION     RIGHT/LEFT HEART CATH AND CORONARY ANGIOGRAPHY N/A 04/26/2022   Procedure: RIGHT/LEFT HEART CATH AND CORONARY ANGIOGRAPHY;  Surgeon: Iran Ouch, MD;  Location: [facility] INVASIVE CV LAB;  Service: Cardiovascular;  Laterality: N/A;   THORACOLUMBAR SYRINGO SHUNT     TONSILLECTOMY AND ADENOIDECTOMY      Current  Medications: Current Meds  Medication Sig   ALPRAZolam (XANAX) 0.5 MG tablet TAKE 1 TABLET BY MOUTH EVERY DAY AT BEDTIME AS NEEDED   Artificial Tear Ointment (DRY EYES OP) Apply 1 drop to eye daily.   aspirin EC 81 MG tablet Take 81 mg by mouth daily.   co-enzyme Q-10 30 MG capsule Take 30 mg by mouth daily.   ezetimibe (ZETIA) 10 MG tablet TAKE 1 TABLET BY MOUTH EVERY DAY   furosemide (LASIX) 20 MG tablet Take 1 tablet (20 mg total) by mouth every other day.   insulin glargine-yfgn (SEMGLEE, YFGN,) 100 UNIT/ML Pen INJECT 18 UNITS SUBCUTANEOUSLY   Insulin Pen Needle (B-D ULTRAFINE III SHORT PEN) 31G X 8 MM MISC Use for administering glucose twice daily   lisinopril (ZESTRIL) 10 MG tablet TAKE 1 TABLET BY MOUTH EVERY DAY   Magnesium 400 MG CAPS Take 400 mg by mouth daily.   Multiple Vitamins-Minerals (PRESERVISION AREDS) CAPS Take 1 capsule by mouth 2 (two) times daily.    nitroGLYCERIN (NITROSTAT) 0.4 MG SL tablet Place 1 tablet (0.4 mg total) under the tongue every 5 (five) minutes as needed for chest pain.   Omega-3 Fatty Acids (FISH OIL PO) Take by mouth daily.   omeprazole (PRILOSEC) 40 MG capsule TAKE 1 CAPSULE (40 MG TOTAL) BY MOUTH DAILY.   ONETOUCH ULTRA test strip CHECK SUGAR TWICE A DAY   pantoprazole (PROTONIX) 40 MG tablet Take 1 tablet (40 mg total) by mouth 2 (two) times daily before a meal.   tobramycin (TOBREX) 0.3 % ophthalmic solution Place into the left eye.   Turmeric 500 MG CAPS Take by mouth daily.   vitamin B-12 (CYANOCOBALAMIN) 500 MCG tablet Take 500 mcg by mouth once a week.    Allergies:   Etodolac, Naproxen, Statins, Latex, Terbinafine, and Terbinafine hcl   Social History   Socioeconomic History   Marital status: Widowed    Spouse name: Not on file   Number of children: 2   Years of education: Not on file   Highest education level: 8th grade  Occupational History   Occupation: retired  Tobacco Use   Smoking status: Never   Smokeless tobacco: Never   Vaping Use   Vaping Use: Never used  Substance and Sexual Activity   Alcohol use: No    Alcohol/week: 0.0 standard drinks of alcohol   Drug use: No   Sexual activity: Not Currently  Other Topics Concern   Not on file  Social History Narrative   Lives with daughter, [name]   Social Determinants of Health   Financial Resource Strain: Low Risk  (09/11/2022)   Overall Financial Resource Strain (CARDIA)    Difficulty of Paying Living Expenses: Not hard at all  Food Insecurity: No Food Insecurity (09/11/2022)   Hunger Vital Sign    Worried About Running Out of Food in the Last Year: Never true    Ran Out of Food in the Last Year: Never true  Transportation Needs: No Transportation Needs (09/11/2022)   PRAPARE - Transportation    Lack of Transportation (  Medical): No    Lack of Transportation (Non-Medical): No  Physical Activity: Insufficiently Active (09/11/2022)   Exercise Vital Sign    Days of Exercise per Week: 2 days    Minutes of Exercise per Session: 20 min  Stress: No Stress Concern Present (09/11/2022)   [employer] of Occupational Health - Occupational Stress Questionnaire    Feeling of Stress : Not at all  Social Connections: Moderately Isolated (09/11/2022)   Social Connection and Isolation Panel [NHANES]    Frequency of Communication with Friends and Family: More than three times a week    Frequency of Social Gatherings with Friends and Family: Twice a week    Attends Religious Services: More than 4 times per year    Active Member of [employer] or Organizations: No    Attends Banker Meetings: Never    Marital Status: Widowed     Family History:  The patient's family history includes Cancer in her brother and brother; Diabetes in her father; Heart attack in her mother; Heart disease in her mother and sister; Pneumonia in her father; Stroke in her brother, father, and sister; Sudden death in her brother, sister, and sister.  ROS:   12-point  review of systems is negative unless otherwise noted in the HPI.   EKGs/Labs/Other Studies Reviewed:    Studies reviewed were summarized above. The additional studies were reviewed today:  2D echo 05/22/2022: 1. Left ventricular ejection fraction, by estimation, is 55 to 60%. The  left ventricle has normal function. The left ventricle has no regional  wall motion abnormalities. There is mild left ventricular hypertrophy.  Left ventricular diastolic parameters  are consistent with Grade II diastolic dysfunction (pseudonormalization).   2. Right ventricular systolic function is normal. The right ventricular  size is normal.   3. The mitral valve is degenerative. Mild mitral valve regurgitation.  Mild to moderate mitral stenosis. The mean mitral valve gradient is 6.0  mmHg. Moderate to severe mitral annular calcification.   4. Mean aortic valve gradient , peak gradient , Vmax 4 m/s,  AVA 0.6cm2, DVI 0.19. The aortic valve is calcified. Aortic valve  regurgitation is not visualized. Severe aortic valve stenosis.  __________  Coronary CTA with morphology 05/16/2022: FINDINGS: Aortic Root:   Aortic valve: Trileaflet   Aortic valve calcium score: 1424   Aortic annulus:   Diameter: 21mm x 17mm   Perimeter: 58mm   Area: 260 mm^2   Calcifications: No calcifications   Coronary height: Min Left - 11mm, Max Left - 14mm; Min Right - 12mm   Sinotubular height: Left cusp - 18mm; Right cusp - 15mm; Noncoronary cusp - 15mm   LVOT (as measured 3 mm below the annulus):   Diameter: 16mm   Area: 273 mm^2   Calcifications: No calcifications   Aortic sinus width: Left cusp - 25mm; Right cusp - 23mm; Noncoronary cusp - 23mm   Sinotubular junction width: 22mm x 21mm   Optimum Fluoroscopic Angle for Delivery: LAO 7 CAU 19   Cardiac:   Right atrium: Normal size   Right ventricle: Normal size   Pulmonary arteries: Normal size   Pulmonary veins: Normal  configuration   Left atrium: Mild enlargement   Left ventricle: Normal size   Pericardium: Normal thickness   Mital valve: Moderate mitral annular calcification   Coronary arteries: Coronary calcium score 117   IMPRESSION: 1. Trileaflet aortic valve with severe calcifications (AV calcium score 1424) 2. Small aortic annulus measuring 21mm x 17mm, with perimeter  58mm and area 260 mm^2. No annular or LVOT calcifications. While annular measurements are suitable for placement of a 23mm Evolut valve, the Sinus of Valsalva diameter measures 23mm at the right and noncoronary cusps, which is below the recommended minimum Sinus of Valsalva diameter (25mm) for a 23mm Evolut. Annulus area is also below the recommended minimum for a 20mm Sapien 3 valve. Recommend discussion at structural heart conference. 3.  Low coronary height to left main, measuring 11mm 4.  Optimum Fluoroscopic Angle for Delivery:  LAO 7 CAU 19 5.  Coronary calcium score 117 __________  [facility] 04/26/2022:   Ost LM lesion is 10% stenosed.   Ramus lesion is 60% stenosed.   Prox LAD to Mid LAD lesion is 30% stenosed.   1.  Left dominant coronary arteries with moderate nonobstructive one-vessel coronary artery disease involving the ramus.  No evidence of obstructive disease. 2. Right heart catheterization showed normal RA pressure, moderately elevated wedge pressure, severe pulmonary hypertension and normal cardiac output.  Prominent V waves on pulmonary wedge tracing suggestive of mitral regurgitation. 3.  Severe aortic stenosis with mean gradient of 40 mmHg.     RA: 2 mmHg RV: 65/2 mmHg PA: 68/27 with a mean of 46 mmHg PCW: Mean pressure of 26 mmHg with prominent V waves at 38 mmHg. LVEDP: 30 mmHg. Cardiac output: 7.5 with an index of 4.3.   Recommendations: Proceed with TAVR evaluation.  No need for coronary revascularization. __________  2D echo 07/25/2021: 1. Left ventricular ejection fraction, by estimation, is  50 to 55%. The  left ventricle has low normal function. The left ventricle has no regional  wall motion abnormalities. Left ventricular diastolic parameters are  consistent with Grade II diastolic  dysfunction (pseudonormalization).   2. Right ventricular systolic function is normal. The right ventricular  size is normal. Tricuspid regurgitation signal is inadequate for assessing  PA pressure.   3. The mitral valve is degenerative. Mild mitral valve regurgitation.   4. Aortic valve mean gradient is , PG 51 mmHg, DVI = 0.28, peak  velocity 3.24m/s. The aortic valve is calcified. Aortic valve  regurgitation is mild. Moderate aortic valve stenosis.   5. The inferior vena cava is normal in size with greater than 50%  respiratory variability, suggesting right atrial pressure of 3 mmHg.  __________  Coronary CTA 07/12/2021: FINDINGS: Aorta: Normal size. Moderate ascending and descending aorta calcifications. No dissection.   Aortic Valve:  Trileaflet.  moderate calcifications.   Coronary Arteries:  Normal coronary origin.  left dominance.   RCA is a small non dominant artery. There is no plaque.   Left main is a large artery that gives rise to LAD and LCX arteries.   LAD is a large vessel that has mild proximal calcifications causing minimal stenosis (<25%).   LCX is a dominant artery that gives rise to an OM1 branch. The LCx further gives rise to the PDA. There is mild proximal and distal LCx calcifications causing minimal stenosis.   Other findings:   Normal pulmonary vein drainage into the left atrium.   Normal left atrial appendage without a thrombus.   Normal size of the pulmonary artery.   IMPRESSION: 1. Coronary calcium score of 80.2. 2. Normal coronary origin with left dominance. 3. Minimal non obstructive proximal LAD and LCx disease. 4. CAD-RADS 1. Minimal non-obstructive CAD (0-24%). Consider non-atherosclerotic causes of chest pain. Consider  preventive therapy and risk factor modification. __________  2D echo 02/23/2020: 1. Left ventricular ejection fraction, by estimation,  is 55 to 60%. The  left ventricle has normal function. The left ventricle has no regional  wall motion abnormalities. There is mild left ventricular hypertrophy.  Left ventricular diastolic parameters  are consistent with Grade II diastolic dysfunction (pseudonormalization).   2. Right ventricular systolic function is normal. The right ventricular  size is normal. There is moderately elevated pulmonary artery systolic  pressure.   3. The mitral valve is degenerative. Mild mitral valve regurgitation.   4. The aortic valve is tricuspid. Aortic valve regurgitation is mild.  Moderate aortic valve stenosis. Aortic valve mean gradient measures 25.8  mmHg. Aortic valve Vmax measures 3.59 m/s.   5. The inferior vena cava is normal in size with greater than 50%  respiratory variability, suggesting right atrial pressure of 3 mmHg.  __________  2D echo 01/27/2019: 1. The left ventricle has normal systolic function with an ejection  fraction of 60-65%. The cavity size was normal. There is mildly increased  left ventricular wall thickness. Left ventricular diastolic Doppler  parameters are consistent with impaired  relaxation.   2. The right ventricle has normal systolic function. The cavity was  normal. There is no increase in right ventricular wall thickness. Right  ventricular systolic pressure is severely elevated with an estimated  pressure of 62.0 mmHg.   3. Tricuspid valve regurgitation is mild-moderate.   4. Mmoderate stenosis of the aortic valve. Mean gradient 26 mm Hg   EKG:  EKG is ordered today.  The EKG ordered today demonstrates NSR, 67 bpm, LBBB (known)  Recent Labs: 11/26/2021: Magnesium 2.0 05/21/2022: B Natriuretic Peptide 300.3 10/02/2022: TSH 2.510 11/20/2022: ALT 15; BUN 36; Creatinine, Ser 1.09; Hemoglobin 10.4; Platelets 203; Potassium 5.1;  Sodium 134  Recent Lipid Panel    Component Value Date/Time   CHOL 230 (H) 10/02/2022 1305   TRIG 237 (H) 10/02/2022 1305   HDL 46 10/02/2022 1305   CHOLHDL 5.0 (H) 10/02/2022 1305   LDLCALC 141 (H) 10/02/2022 1305    PHYSICAL EXAM:    VS:  BP 110/60 (BP Location: Left Arm, Patient Position: Sitting, Cuff Size: Normal)   Pulse 67   Ht 5' 4.5" (1.638 m)   Wt 151 lb 6 oz (68.7 kg)   SpO2 99%   BMI 25.58 kg/m   BMI: Body mass index is 25.58 kg/m.  Physical Exam Vitals reviewed.  Constitutional:      Appearance: She is well-developed.  HENT:     Head: Normocephalic and atraumatic.  Eyes:     General:        Right eye: No discharge.        Left eye: No discharge.  Neck:     Vascular: No JVD.  Cardiovascular:     Rate and Rhythm: Normal rate and regular rhythm.     Pulses:          Posterior tibial pulses are 2+ on the right side and 2+ on the left side.     Heart sounds: S1 normal and S2 normal. Heart sounds not distant. No midsystolic click and no opening snap. Murmur heard.     Harsh midsystolic murmur is present with a grade of 3/6 at the upper right sternal border radiating to the neck.     No friction rub.  Pulmonary:     Effort: Pulmonary effort is normal. No respiratory distress.     Breath sounds: Normal breath sounds. No decreased breath sounds, wheezing or rales.  Chest:     Chest wall: No tenderness.  Abdominal:     General: There is no distension.  Musculoskeletal:     Cervical back: Normal range of motion.  Skin:    General: Skin is warm and dry.     Nails: There is no clubbing.  Neurological:     Mental Status: She is alert and oriented to person, place, and time.  Psychiatric:        Speech: Speech normal.        Behavior: Behavior normal.        Thought Content: Thought content normal.        Judgment: Judgment normal.     Wt Readings from Last 3 Encounters:  11/20/22 151 lb 6 oz (68.7 kg)  10/02/22 148 lb 1.6 oz (67.2 kg)  09/11/22 151 lb  (68.5 kg)     ASSESSMENT & PLAN:   Severe aortic stenosis: Stable, without symptoms of angina or decompensation.  Structural heart team has felt to the patient's aortic stenosis is not necessarily severe given good mobility of valve.  Patient's anatomy not suitable for TAVR with recommendation for medical therapy moving forward.  No indication for repeat echo at this time.  Continue medical therapy.  Nonobstructive CAD: No symptoms suggestive of angina.  Recent LHC in 04/2022 showed 60% ramus stenosis with otherwise no obstructive disease.  Continue aggressive risk factor modification and primary prevention including aspirin and ezetimibe.  Statin intolerant.  No indication for further ischemic testing at this time.  Pulmonary hypertension: Stable on low-dose furosemide every other day.  Previously noted on echo and cardiac cath.  Likely exacerbated by underlying aortic valve disease.  Has had symptomatic improvement with improvement in hemoglobin.  Stable renal function on last check.  Check CMP.  HTN: Blood pressure is well-controlled in the office today.  No changes in medical therapy.  HLD with statin intolerance: LDL 141 in 09/2022.  Intolerant to statins.  She remains on ezetimibe, which is managed by PCP.  Iron deficiency anemia: She has noted a 3 to 4-day history of darker stools with an increase in fatigue.  Most recent ferritin normalized at 115 with a hemoglobin improved to 11.6 and normal iron level of 47.  VCE showed no significant AVMs.  GI not pursuing EGD or colonoscopy given comorbid conditions, if hemoglobin remained stable, and in the context of overall largely stable symptoms, this appears reasonable, though will will defer risk versus benefit to them.  Anemia not necessarily Heyde's syndrome per structural heart team.  Check CBC and iron panel with plans to route results to patient's hematologist.  LBBB: Stable.  No syncope.  Echo with preserved LV systolic function.  LHC with  nonobstructive CAD.   Disposition: F/u with Dr. Mariah Milling or an APP in 3 months.   Medication Adjustments/Labs and Tests Ordered: Current medicines are reviewed at length with the patient today.  Concerns regarding medicines are outlined above. Medication changes, Labs and Tests ordered today are summarized above and listed in the Patient Instructions accessible in Encounters.   Signed, Eula Listen, PA-C 11/20/2022 12:06 PM     [facility] [place] [address] [place], [place] [address] [phone]

## 2022-11-20 ENCOUNTER — Encounter: Payer: Self-pay | Admitting: Physician Assistant

## 2022-11-20 ENCOUNTER — Other Ambulatory Visit
Admission: RE | Admit: 2022-11-20 | Discharge: 2022-11-20 | Disposition: A | Discharge status: Home / Self Care | Payer: PPO | Source: Ambulatory Visit | Attending: Physician Assistant | Admitting: Physician Assistant

## 2022-11-20 ENCOUNTER — Ambulatory Visit: Payer: PPO | Attending: Physician Assistant | Admitting: Physician Assistant

## 2022-11-20 VITALS — BP 110/60 | HR 67 | Ht 64.5 in | Wt 151.4 lb

## 2022-11-20 DIAGNOSIS — I272 Pulmonary hypertension, unspecified: Secondary | ICD-10-CM | POA: Diagnosis not present

## 2022-11-20 DIAGNOSIS — D649 Anemia, unspecified: Secondary | ICD-10-CM | POA: Diagnosis not present

## 2022-11-20 DIAGNOSIS — I447 Left bundle-branch block, unspecified: Secondary | ICD-10-CM

## 2022-11-20 DIAGNOSIS — I2510 Atherosclerotic heart disease of native coronary artery without angina pectoris: Secondary | ICD-10-CM

## 2022-11-20 DIAGNOSIS — I350 Nonrheumatic aortic (valve) stenosis: Secondary | ICD-10-CM | POA: Diagnosis not present

## 2022-11-20 DIAGNOSIS — Z789 Other specified health status: Secondary | ICD-10-CM | POA: Diagnosis not present

## 2022-11-20 DIAGNOSIS — E782 Mixed hyperlipidemia: Secondary | ICD-10-CM | POA: Diagnosis not present

## 2022-11-20 DIAGNOSIS — I10 Essential (primary) hypertension: Secondary | ICD-10-CM | POA: Diagnosis not present

## 2022-11-20 DIAGNOSIS — R0602 Shortness of breath: Secondary | ICD-10-CM | POA: Insufficient documentation

## 2022-11-20 LAB — IRON AND TIBC
Iron: 74 ug/dL (ref 28–170)
Saturation Ratios: 21 % (ref 10.4–31.8)
TIBC: 361 ug/dL (ref 250–450)
UIBC: 287 ug/dL

## 2022-11-20 LAB — CBC
HCT: 32.8 % — ABNORMAL LOW (ref 36.0–46.0)
Hemoglobin: 10.4 g/dL — ABNORMAL LOW (ref 12.0–15.0)
MCH: 31 pg (ref 26.0–34.0)
MCHC: 31.7 g/dL (ref 30.0–36.0)
MCV: 97.9 fL (ref 80.0–100.0)
Platelets: 203 10*3/uL (ref 150–400)
RBC: 3.35 MIL/uL — ABNORMAL LOW (ref 3.87–5.11)
RDW: 13.9 % (ref 11.5–15.5)
WBC: 5.5 10*3/uL (ref 4.0–10.5)
nRBC: 0 % (ref 0.0–0.2)

## 2022-11-20 LAB — COMPREHENSIVE METABOLIC PANEL
ALT: 15 U/L (ref 0–44)
AST: 20 U/L (ref 15–41)
Albumin: 3.7 g/dL (ref 3.5–5.0)
Alkaline Phosphatase: 96 U/L (ref 38–126)
Anion gap: 6 (ref 5–15)
BUN: 36 mg/dL — ABNORMAL HIGH (ref 8–23)
CO2: 25 mmol/L (ref 22–32)
Calcium: 8.4 mg/dL — ABNORMAL LOW (ref 8.9–10.3)
Chloride: 103 mmol/L (ref 98–111)
Creatinine, Ser: 1.09 mg/dL — ABNORMAL HIGH (ref 0.44–1.00)
GFR, Estimated: 47 mL/min — ABNORMAL LOW (ref >60)
Glucose, Bld: 178 mg/dL — ABNORMAL HIGH (ref 70–99)
Potassium: 5.1 mmol/L (ref 3.5–5.1)
Sodium: 134 mmol/L — ABNORMAL LOW (ref 135–145)
Total Bilirubin: 0.9 mg/dL (ref 0.3–1.2)
Total Protein: 6.1 g/dL — ABNORMAL LOW (ref 6.5–8.1)

## 2022-11-20 LAB — FERRITIN: Ferritin: 27 ng/mL (ref 11–307)

## 2022-11-20 NOTE — Patient Instructions (Signed)
Medication Instructions:  No changes at this time.   *If you need a refill on your cardiac medications before your next appointment, please call your pharmacy*   Lab Work: CBC, CMET, Iron panel today over at the [facility] entrance and check in at registration.   If you have labs (blood work) drawn today and your tests are completely normal, you will receive your results only by: [place] (if you have MyChart) OR A paper copy in the mail If you have any lab test that is abnormal or we need to change your treatment, we will call you to review the results.   Testing/Procedures: None   Follow-Up: At [facility], you and your health needs are our priority.  As part of our continuing mission to provide you with exceptional heart care, we have created designated Provider Care Teams.  These Care Teams include your primary Cardiologist (physician) and Advanced Practice Providers (APPs -  Physician Assistants and Nurse Practitioners) who all work together to provide you with the care you need, when you need it.   Your next appointment:   3 month(s)  The format for your next appointment:   In Person  Provider:   Ida Rogue, MD or Christell Faith, PA-C      Important Information About Sugar

## 2022-11-21 ENCOUNTER — Telehealth: Payer: Self-pay | Admitting: Cardiovascular Disease

## 2022-11-21 NOTE — Telephone Encounter (Signed)
Patient's daughter is requesting that someone call the patient to discuss her lab results.

## 2022-11-22 NOTE — Telephone Encounter (Signed)
Results and recommendations reviewed with patients daughter and she requested that we forward those over to Dr. Tasia Catchings. No further needs at this time.

## 2022-11-25 ENCOUNTER — Encounter: Payer: Self-pay | Admitting: Oncology

## 2022-11-25 ENCOUNTER — Inpatient Hospital Stay
Admission: EM | Admit: 2022-11-25 | Discharge: 2022-11-27 | DRG: 377 | Disposition: A | Discharge status: Home / Self Care | Payer: PPO | Attending: Internal Medicine | Admitting: Internal Medicine

## 2022-11-25 ENCOUNTER — Inpatient Hospital Stay (HOSPITAL_BASED_OUTPATIENT_CLINIC_OR_DEPARTMENT_OTHER): Payer: PPO | Admitting: Oncology

## 2022-11-25 ENCOUNTER — Other Ambulatory Visit: Payer: Self-pay

## 2022-11-25 ENCOUNTER — Encounter: Payer: Self-pay | Admitting: Internal Medicine

## 2022-11-25 ENCOUNTER — Inpatient Hospital Stay: Payer: PPO | Attending: Oncology

## 2022-11-25 VITALS — BP 114/40 | HR 78 | Temp 96.9°F | Wt 148.2 lb

## 2022-11-25 DIAGNOSIS — E782 Mixed hyperlipidemia: Secondary | ICD-10-CM | POA: Diagnosis present

## 2022-11-25 DIAGNOSIS — K921 Melena: Secondary | ICD-10-CM

## 2022-11-25 DIAGNOSIS — N1832 Chronic kidney disease, stage 3b: Secondary | ICD-10-CM | POA: Diagnosis present

## 2022-11-25 DIAGNOSIS — R5381 Other malaise: Secondary | ICD-10-CM | POA: Diagnosis not present

## 2022-11-25 DIAGNOSIS — Z90710 Acquired absence of both cervix and uterus: Secondary | ICD-10-CM | POA: Diagnosis not present

## 2022-11-25 DIAGNOSIS — R54 Age-related physical debility: Secondary | ICD-10-CM | POA: Diagnosis present

## 2022-11-25 DIAGNOSIS — I10 Essential (primary) hypertension: Secondary | ICD-10-CM | POA: Diagnosis not present

## 2022-11-25 DIAGNOSIS — K922 Gastrointestinal hemorrhage, unspecified: Secondary | ICD-10-CM | POA: Diagnosis not present

## 2022-11-25 DIAGNOSIS — D500 Iron deficiency anemia secondary to blood loss (chronic): Secondary | ICD-10-CM | POA: Diagnosis not present

## 2022-11-25 DIAGNOSIS — Z91040 Latex allergy status: Secondary | ICD-10-CM

## 2022-11-25 DIAGNOSIS — Z794 Long term (current) use of insulin: Secondary | ICD-10-CM | POA: Diagnosis not present

## 2022-11-25 DIAGNOSIS — I350 Nonrheumatic aortic (valve) stenosis: Secondary | ICD-10-CM | POA: Diagnosis not present

## 2022-11-25 DIAGNOSIS — E039 Hypothyroidism, unspecified: Secondary | ICD-10-CM | POA: Diagnosis present

## 2022-11-25 DIAGNOSIS — K767 Hepatorenal syndrome: Secondary | ICD-10-CM | POA: Diagnosis present

## 2022-11-25 DIAGNOSIS — Q2730 Arteriovenous malformation, site unspecified: Secondary | ICD-10-CM | POA: Diagnosis not present

## 2022-11-25 DIAGNOSIS — D509 Iron deficiency anemia, unspecified: Secondary | ICD-10-CM | POA: Diagnosis not present

## 2022-11-25 DIAGNOSIS — Z833 Family history of diabetes mellitus: Secondary | ICD-10-CM

## 2022-11-25 DIAGNOSIS — I252 Old myocardial infarction: Secondary | ICD-10-CM

## 2022-11-25 DIAGNOSIS — E7800 Pure hypercholesterolemia, unspecified: Secondary | ICD-10-CM | POA: Diagnosis not present

## 2022-11-25 DIAGNOSIS — E1122 Type 2 diabetes mellitus with diabetic chronic kidney disease: Secondary | ICD-10-CM | POA: Diagnosis not present

## 2022-11-25 DIAGNOSIS — Z823 Family history of stroke: Secondary | ICD-10-CM

## 2022-11-25 DIAGNOSIS — Z7982 Long term (current) use of aspirin: Secondary | ICD-10-CM

## 2022-11-25 DIAGNOSIS — D649 Anemia, unspecified: Secondary | ICD-10-CM | POA: Diagnosis not present

## 2022-11-25 DIAGNOSIS — Z79899 Other long term (current) drug therapy: Secondary | ICD-10-CM | POA: Diagnosis not present

## 2022-11-25 DIAGNOSIS — Z66 Do not resuscitate: Secondary | ICD-10-CM | POA: Diagnosis present

## 2022-11-25 DIAGNOSIS — D62 Acute posthemorrhagic anemia: Secondary | ICD-10-CM | POA: Diagnosis not present

## 2022-11-25 DIAGNOSIS — F411 Generalized anxiety disorder: Secondary | ICD-10-CM | POA: Diagnosis present

## 2022-11-25 DIAGNOSIS — I5032 Chronic diastolic (congestive) heart failure: Secondary | ICD-10-CM | POA: Diagnosis present

## 2022-11-25 DIAGNOSIS — E119 Type 2 diabetes mellitus without complications: Secondary | ICD-10-CM

## 2022-11-25 DIAGNOSIS — I447 Left bundle-branch block, unspecified: Secondary | ICD-10-CM | POA: Diagnosis present

## 2022-11-25 DIAGNOSIS — Z8249 Family history of ischemic heart disease and other diseases of the circulatory system: Secondary | ICD-10-CM | POA: Diagnosis not present

## 2022-11-25 DIAGNOSIS — Z801 Family history of malignant neoplasm of trachea, bronchus and lung: Secondary | ICD-10-CM

## 2022-11-25 DIAGNOSIS — I2510 Atherosclerotic heart disease of native coronary artery without angina pectoris: Secondary | ICD-10-CM | POA: Diagnosis present

## 2022-11-25 DIAGNOSIS — I272 Pulmonary hypertension, unspecified: Secondary | ICD-10-CM | POA: Diagnosis not present

## 2022-11-25 DIAGNOSIS — K31811 Angiodysplasia of stomach and duodenum with bleeding: Secondary | ICD-10-CM | POA: Diagnosis not present

## 2022-11-25 DIAGNOSIS — I130 Hypertensive heart and chronic kidney disease with heart failure and stage 1 through stage 4 chronic kidney disease, or unspecified chronic kidney disease: Secondary | ICD-10-CM | POA: Diagnosis present

## 2022-11-25 DIAGNOSIS — Z8616 Personal history of COVID-19: Secondary | ICD-10-CM

## 2022-11-25 HISTORY — DX: Nonrheumatic aortic (valve) stenosis: I35.0

## 2022-11-25 LAB — RETIC PANEL
Immature Retic Fract: 25.3 % — ABNORMAL HIGH (ref 2.3–15.9)
RBC.: 2.5 MIL/uL — ABNORMAL LOW (ref 3.87–5.11)
Retic Count, Absolute: 82.3 10*3/uL (ref 19.0–186.0)
Retic Ct Pct: 3.3 % — ABNORMAL HIGH (ref 0.4–3.1)
Reticulocyte Hemoglobin: 30.7 pg (ref >27.9)

## 2022-11-25 LAB — COMPREHENSIVE METABOLIC PANEL
ALT: 13 U/L (ref 0–44)
AST: 21 U/L (ref 15–41)
Albumin: 3.5 g/dL (ref 3.5–5.0)
Alkaline Phosphatase: 77 U/L (ref 38–126)
Anion gap: 10 (ref 5–15)
BUN: 32 mg/dL — ABNORMAL HIGH (ref 8–23)
CO2: 21 mmol/L — ABNORMAL LOW (ref 22–32)
Calcium: 8.5 mg/dL — ABNORMAL LOW (ref 8.9–10.3)
Chloride: 104 mmol/L (ref 98–111)
Creatinine, Ser: 1.18 mg/dL — ABNORMAL HIGH (ref 0.44–1.00)
GFR, Estimated: 43 mL/min — ABNORMAL LOW (ref >60)
Glucose, Bld: 235 mg/dL — ABNORMAL HIGH (ref 70–99)
Potassium: 4.3 mmol/L (ref 3.5–5.1)
Sodium: 135 mmol/L (ref 135–145)
Total Bilirubin: 0.8 mg/dL (ref 0.3–1.2)
Total Protein: 5.7 g/dL — ABNORMAL LOW (ref 6.5–8.1)

## 2022-11-25 LAB — CBC
HCT: 27.7 % — ABNORMAL LOW (ref 36.0–46.0)
Hemoglobin: 8.5 g/dL — ABNORMAL LOW (ref 12.0–15.0)
MCH: 31.1 pg (ref 26.0–34.0)
MCHC: 30.7 g/dL (ref 30.0–36.0)
MCV: 101.5 fL — ABNORMAL HIGH (ref 80.0–100.0)
Platelets: UNDETERMINED 10*3/uL (ref 150–400)
RBC: 2.73 MIL/uL — ABNORMAL LOW (ref 3.87–5.11)
RDW: 14.3 % (ref 11.5–15.5)
WBC: 10.2 10*3/uL (ref 4.0–10.5)
nRBC: 0 % (ref 0.0–0.2)

## 2022-11-25 LAB — CBC WITH DIFFERENTIAL/PLATELET
Abs Immature Granulocytes: 0.01 10*3/uL (ref 0.00–0.07)
Basophils Absolute: 0.1 10*3/uL (ref 0.0–0.1)
Basophils Relative: 2 %
Eosinophils Absolute: 0.2 10*3/uL (ref 0.0–0.5)
Eosinophils Relative: 3 %
HCT: 24.7 % — ABNORMAL LOW (ref 36.0–46.0)
Hemoglobin: 7.9 g/dL — ABNORMAL LOW (ref 12.0–15.0)
Immature Granulocytes: 0 %
Lymphocytes Relative: 33 %
Lymphs Abs: 2 10*3/uL (ref 0.7–4.0)
MCH: 31.2 pg (ref 26.0–34.0)
MCHC: 32 g/dL (ref 30.0–36.0)
MCV: 97.6 fL (ref 80.0–100.0)
Monocytes Absolute: 0.6 10*3/uL (ref 0.1–1.0)
Monocytes Relative: 10 %
Neutro Abs: 3.1 10*3/uL (ref 1.7–7.7)
Neutrophils Relative %: 52 %
Platelets: 219 10*3/uL (ref 150–400)
RBC: 2.53 MIL/uL — ABNORMAL LOW (ref 3.87–5.11)
RDW: 14.4 % (ref 11.5–15.5)
WBC: 5.9 10*3/uL (ref 4.0–10.5)
nRBC: 0 % (ref 0.0–0.2)

## 2022-11-25 LAB — TROPONIN I (HIGH SENSITIVITY)
Troponin I (High Sensitivity): 19 ng/L — ABNORMAL HIGH (ref <18)
Troponin I (High Sensitivity): 20 ng/L — ABNORMAL HIGH (ref <18)

## 2022-11-25 LAB — GLUCOSE, CAPILLARY: Glucose-Capillary: 180 mg/dL — ABNORMAL HIGH (ref 70–99)

## 2022-11-25 LAB — HEMOGLOBIN AND HEMATOCRIT, BLOOD
HCT: 28.6 % — ABNORMAL LOW (ref 36.0–46.0)
Hemoglobin: 9.3 g/dL — ABNORMAL LOW (ref 12.0–15.0)

## 2022-11-25 LAB — CBG MONITORING, ED: Glucose-Capillary: 122 mg/dL — ABNORMAL HIGH (ref 70–99)

## 2022-11-25 LAB — SAMPLE TO BLOOD BANK

## 2022-11-25 LAB — VITAMIN B12: Vitamin B-12: 480 pg/mL (ref 180–914)

## 2022-11-25 LAB — FOLATE: Folate: 34 ng/mL (ref >5.9)

## 2022-11-25 MED ORDER — PANTOPRAZOLE 80MG IVPB - SIMPLE MED
80.0000 mg | Freq: Once | INTRAVENOUS | Status: AC
  Administered 2022-11-25: 80 mg via INTRAVENOUS
  Filled 2022-11-25: qty 100

## 2022-11-25 MED ORDER — MAGNESIUM OXIDE -MG SUPPLEMENT 400 (240 MG) MG PO TABS
400.0000 mg | ORAL_TABLET | Freq: Every day | ORAL | Status: DC
  Administered 2022-11-25 – 2022-11-27 (×3): 400 mg via ORAL
  Filled 2022-11-25 (×3): qty 1

## 2022-11-25 MED ORDER — LISINOPRIL 10 MG PO TABS
10.0000 mg | ORAL_TABLET | Freq: Every day | ORAL | Status: DC
  Administered 2022-11-25 – 2022-11-27 (×3): 10 mg via ORAL
  Filled 2022-11-25 (×3): qty 1

## 2022-11-25 MED ORDER — SENNA 8.6 MG PO TABS
1.0000 | ORAL_TABLET | Freq: Two times a day (BID) | ORAL | Status: DC
  Administered 2022-11-25 – 2022-11-27 (×3): 8.6 mg via ORAL
  Filled 2022-11-25 (×4): qty 1

## 2022-11-25 MED ORDER — NITROGLYCERIN 0.4 MG SL SUBL: 0.4000 mg | SUBLINGUAL_TABLET | SUBLINGUAL | Status: DC | PRN

## 2022-11-25 MED ORDER — SODIUM CHLORIDE 0.9 % IV SOLN: 10.0000 mL/h | Freq: Once | INTRAVENOUS | Status: DC

## 2022-11-25 MED ORDER — ALPRAZOLAM 0.5 MG PO TABS: 0.5000 mg | ORAL_TABLET | Freq: Every evening | ORAL | Status: DC | PRN

## 2022-11-25 MED ORDER — SODIUM CHLORIDE 0.9 % IV SOLN: INTRAVENOUS | Status: DC

## 2022-11-25 MED ORDER — INSULIN ASPART 100 UNIT/ML IJ SOLN
0.0000 [IU] | Freq: Three times a day (TID) | INTRAMUSCULAR | Status: DC
  Administered 2022-11-26 – 2022-11-27 (×3): 2 [IU] via SUBCUTANEOUS
  Filled 2022-11-25 (×3): qty 1

## 2022-11-25 MED ORDER — ACETAMINOPHEN 650 MG RE SUPP: 650.0000 mg | Freq: Four times a day (QID) | RECTAL | Status: DC | PRN

## 2022-11-25 MED ORDER — ZOLPIDEM TARTRATE 5 MG PO TABS: 5.0000 mg | ORAL_TABLET | Freq: Every evening | ORAL | Status: DC | PRN

## 2022-11-25 MED ORDER — SODIUM CHLORIDE 0.9 % IV BOLUS
500.0000 mL | Freq: Once | INTRAVENOUS | Status: AC
  Administered 2022-11-25: 500 mL via INTRAVENOUS

## 2022-11-25 MED ORDER — PANTOPRAZOLE INFUSION (NEW) - SIMPLE MED
8.0000 mg/h | INTRAVENOUS | Status: DC
Start: 2022-11-25 — End: 2022-11-27
  Administered 2022-11-25 – 2022-11-27 (×5): 8 mg/h via INTRAVENOUS
  Filled 2022-11-25 (×4): qty 100

## 2022-11-25 MED ORDER — EZETIMIBE 10 MG PO TABS
10.0000 mg | ORAL_TABLET | Freq: Every day | ORAL | Status: DC
  Administered 2022-11-25 – 2022-11-27 (×3): 10 mg via ORAL
  Filled 2022-11-25 (×3): qty 1

## 2022-11-25 MED ORDER — ACETAMINOPHEN 325 MG PO TABS: 650.0000 mg | ORAL_TABLET | Freq: Four times a day (QID) | ORAL | Status: DC | PRN

## 2022-11-25 MED ORDER — INSULIN GLARGINE-YFGN 100 UNIT/ML ~~LOC~~ SOLN
18.0000 [IU] | Freq: Every day | SUBCUTANEOUS | Status: DC
  Administered 2022-11-26: 18 [IU] via SUBCUTANEOUS
  Filled 2022-11-25 (×3): qty 0.18

## 2022-11-25 MED ORDER — PANTOPRAZOLE SODIUM 40 MG IV SOLR: 40.0000 mg | Freq: Two times a day (BID) | INTRAVENOUS | Status: DC

## 2022-11-25 MED ORDER — FUROSEMIDE 20 MG PO TABS
20.0000 mg | ORAL_TABLET | ORAL | Status: DC
  Administered 2022-11-25 – 2022-11-27 (×2): 20 mg via ORAL
  Filled 2022-11-25 (×2): qty 1

## 2022-11-25 NOTE — Assessment & Plan Note (Signed)
Patient takes basal insulin and before meals insulin. Last A1C 10/02/22 was 6.4%  Plan Continue basal insulin  Sliding scale coverage AC while in hospital

## 2022-11-25 NOTE — ED Provider Triage Note (Signed)
Emergency Medicine Provider Triage Evaluation Note  Jasmine Webster , a 89 y.o. female  was evaluated in triage.  Pt complains of rectal bleeding, [facility] from [facility] as her hemoglobin is dropped from 10-7, may need transfusion and GI bleed workup.  Review of Systems  Positive:  Negative:   Physical Exam  Ht 6' 4.5" (1.943 m)   Wt 65.3 kg   BMI 17.30 kg/m  Gen:   Awake, no distress   Resp:  Normal effort  MSK:   Moves extremities without difficulty  Other:    Medical Decision Making  Medically screening exam initiated at 11:59 AM.  Appropriate orders placed.  LILLYANNA GLANDON was informed that the remainder of the evaluation will be completed by another provider, this initial triage assessment does not replace that evaluation, and the importance of remaining in the ED until their evaluation is complete.     Versie Starks, PA-C 11/25/22 1200

## 2022-11-25 NOTE — H&P (Signed)
History and Physical    Jasmine Webster JAS:[id number] DOB: Apr 13, 1926 DOA: 11/25/2022  DOS: the patient was seen and examined on 11/25/2022  PCP: Jasmine Foster, MD   Patient coming from: Home  I have personally briefly reviewed patient's old medical records in [facility]  Jasmine Webster, a 89 y/o widow with a h/o several episodes of melena and blood loss anemia reports that she has had melena for the week prior to admission with progressive weakness. Today her daughter found her on the floor to weak to get up. Other medical problems include HTN, HLD, hypothyroidism, severe aortic stenosis, DM, CAD. She was brought to [facility] for evaluation of weakness and melena. Patient does admit to having had an episode of chest pain with exertion the day prior to admission.    ED Course: febrile  124/35  HR 65  RR 15. Per EDP - patient in no distress, no focal findings on exam. Lab -  glucose 235, Cr 1.18 Troponin 20, Hgb #1 7.9, #2 8.5. EKG w/o acute changes. Dr Virgina Jock, GI, consulted on the patient in the ED - not a candidate for endoscopy. TRH called to admit for continued management.  Review of Systems:  Review of Systems  Constitutional:  Positive for malaise/fatigue. Negative for chills, diaphoresis, fever and weight loss.  HENT: Negative.    Eyes: Negative.   Respiratory: Negative.    Cardiovascular:  Positive for chest pain. Negative for orthopnea and leg swelling.       Transient episode of chest pain with exertion 11/24/22  Gastrointestinal:  Positive for melena. Negative for abdominal pain, heartburn, nausea and vomiting.  Genitourinary: Negative.   Musculoskeletal: Negative.   Skin: Negative.   Neurological:  Positive for weakness. Negative for dizziness, focal weakness and headaches.  Endo/Heme/Allergies:  Does not bruise/bleed easily.  Psychiatric/Behavioral: Negative.      Past Medical History:  Diagnosis Date   Aortic stenosis    severe   Arthritis    Cataract     cataract removal bilaterally approx 20 years ago   Chronic back pain    Coronary artery disease    COVID-19 virus infection 12/2020   Diabetes mellitus without complication (HCC)    Heart murmur    Hyperlipidemia    Hypertension     Past Surgical History:  Procedure Laterality Date   ABDOMINAL HYSTERECTOMY     APPENDECTOMY     BACK SURGERY     three   CARDIAC CATHETERIZATION     6+yrs no stents Dr. Verneda Skill   CATARACT EXTRACTION     RIGHT/LEFT HEART CATH AND CORONARY ANGIOGRAPHY N/A 04/26/2022   Procedure: RIGHT/LEFT HEART CATH AND CORONARY ANGIOGRAPHY;  Surgeon: Wellington Hampshire, MD;  Location: [facility];  Service: Cardiovascular;  Laterality: N/A;   THORACOLUMBAR SYRINGO SHUNT     TONSILLECTOMY AND ADENOIDECTOMY      Soc Hx - marriage # 1 - 36 years; marriage #2  44 years - widowed. She has 1 daughter, 1 son, 1 step-son, 8 grandchildren, 30 great-grandchildren. She lives with her daughter and son-in-law. Discussed code status and she clearly stated she does not want cardiac resuscitation attempt or intubation.    reports that she has never smoked. She has never used smokeless tobacco. She reports that she does not drink alcohol and does not use drugs.  Allergies  Allergen Reactions   Etodolac Nausea Only, Other (See Comments) and Anxiety    Dizziness, too   Naproxen Other (See Comments)  and Anxiety    Gave her an ulcer after taking it for years   Statins Other (See Comments)    No energy and myalgias   Latex Rash   Terbinafine Other (See Comments) and Rash    "Maybe made me nervous"   Terbinafine Hcl Rash    Family History  Problem Relation Age of Onset   Heart disease Mother        died from MI   Heart attack Mother    Heart disease Sister    Sudden death Brother        shot and beaten to death during home invasion   Stroke Sister    Sudden death Sister        MVA   Cancer Brother        died from lung cancer   Sudden death Sister         8 days old   Cancer Brother        died from lung cancer   Stroke Brother        cause of death   Diabetes Father    Stroke Father    Pneumonia Father     Prior to Admission medications   Medication Sig Start Date End Date Taking? Authorizing Provider  ALPRAZolam Duanne Moron) 0.5 MG tablet TAKE 1 TABLET BY MOUTH EVERY DAY AT BEDTIME AS NEEDED 07/24/22  Yes Jerrol Banana., MD  Artificial Tear Ointment (DRY EYES OP) Apply 1 drop to eye daily.   Yes [provider]  aspirin EC 81 MG tablet Take 81 mg by mouth daily.   Yes [provider]  co-enzyme Q-10 30 MG capsule Take 30 mg by mouth daily.   Yes [provider]  ezetimibe (ZETIA) 10 MG tablet TAKE 1 TABLET BY MOUTH EVERY DAY 06/19/22  Yes Jerrol Banana., MD  furosemide (LASIX) 20 MG tablet Take 1 tablet (20 mg total) by mouth every other day. 10/30/22  Yes Gollan, Kathlene November, MD  insulin glargine-yfgn (SEMGLEE, YFGN,) 100 UNIT/ML Pen INJECT 18 UNITS SUBCUTANEOUSLY 10/05/22  Yes Simmons-Robinson, Makiera, MD  lisinopril (ZESTRIL) 10 MG tablet TAKE 1 TABLET BY MOUTH EVERY DAY 06/19/22  Yes Jerrol Banana., MD  Magnesium 400 MG CAPS Take 400 mg by mouth daily.   Yes [provider]  Multiple Vitamins-Minerals (PRESERVISION AREDS) CAPS Take 1 capsule by mouth 2 (two) times daily.    Yes [provider]  Omega-3 Fatty Acids (FISH OIL PO) Take by mouth daily.   Yes [provider]  omeprazole (PRILOSEC) 40 MG capsule TAKE 1 CAPSULE (40 MG TOTAL) BY MOUTH DAILY. 06/19/22  Yes Jerrol Banana., MD  pantoprazole (PROTONIX) 40 MG tablet Take 1 tablet (40 mg total) by mouth 2 (two) times daily before a meal. 11/28/21  Yes Fritzi Mandes, MD  tobramycin (TOBREX) 0.3 % ophthalmic solution Place into the left eye. 08/26/22  Yes [provider]  Turmeric 500 MG CAPS Take by mouth daily.   Yes [provider]  vitamin B-12 (CYANOCOBALAMIN) 500 MCG tablet Take 500 mcg by  mouth once a week.   Yes [provider]  Insulin Pen Needle (B-D ULTRAFINE III SHORT PEN) 31G X 8 MM MISC Use for administering glucose twice daily 10/05/22   Simmons-Robinson, Makiera, MD  nitroGLYCERIN (NITROSTAT) 0.4 MG SL tablet Place 1 tablet (0.4 mg total) under the tongue every 5 (five) minutes as needed for chest pain. 07/02/21   Gollan,  Kathlene November, MD  [facility] ULTRA test strip CHECK SUGAR TWICE A DAY 08/16/22   Jerrol Banana., MD    Physical Exam: Vitals:   11/25/22 1620 11/25/22 1640 11/25/22 1657 11/25/22 1702  BP: (!) 126/39 (!) 117/39 (!) 122/38 (!) 119/38  Pulse: 76 72 69 68  Resp: '16 16 18 16  '$ Temp: 98.2 F (36.8 C) 98.2 F (36.8 C) 98 F (36.7 C) 98.2 F (36.8 C)  TempSrc: Oral Oral Oral Oral  SpO2: 100% 100% 100% 100%  Weight:      Height:        Physical Exam Vitals and nursing note reviewed.  Constitutional:      General: She is in acute distress.     Appearance: Normal appearance. She is normal weight. She is not ill-appearing.  HENT:     Head: Normocephalic and atraumatic.     Nose: Nose normal.     Mouth/Throat:     Mouth: Mucous membranes are moist.     Pharynx: Oropharynx is clear. No oropharyngeal exudate or posterior oropharyngeal erythema.  Eyes:     Extraocular Movements: Extraocular movements intact.     Conjunctiva/sclera: Conjunctivae normal.     Pupils: Pupils are equal, round, and reactive to light.  Neck:     Vascular: No carotid bruit.  Cardiovascular:     Rate and Rhythm: Normal rate and regular rhythm.     Pulses: Normal pulses.     Heart sounds: Murmur heard.     Comments: Machinery holosystolic mm best at RSB, heart at LSB and apex c/w w/ severe AoV stenosis and moderated MoV stenosis Pulmonary:     Effort: Pulmonary effort is normal.     Breath sounds: Normal breath sounds.  Abdominal:     General: Bowel sounds are normal.     Palpations: Abdomen is soft. There is no mass.     Tenderness: There is no abdominal  tenderness.  Musculoskeletal:        General: Normal range of motion.     Cervical back: Normal range of motion and neck supple.  Skin:    General: Skin is warm.     Coloration: Skin is pale. Skin is not jaundiced.     Findings: No bruising.  Neurological:     General: No focal deficit present.     Mental Status: She is alert and oriented to person, place, and time. Mental status is at baseline.     Cranial Nerves: No cranial nerve deficit.     Motor: Weakness present.  Psychiatric:        Mood and Affect: Mood normal.        Behavior: Behavior normal.      Labs on Admission: I have personally reviewed following labs and imaging studies  CBC: Recent Labs  Lab 11/20/22 1120 11/25/22 1056 11/25/22 1206  WBC 5.5 5.9 10.2  NEUTROABS  --  3.1  --   HGB 10.4* 7.9* 8.5*  HCT 32.8* 24.7* 27.7*  MCV 97.9 97.6 101.5*  PLT 203 219 PLATELET CLUMPS NOTED ON SMEAR, UNABLE TO ESTIMATE   Basic Metabolic Panel: Recent Labs  Lab 11/20/22 1120 11/25/22 1206  NA 134* 135  K 5.1 4.3  CL 103 104  CO2 25 21*  GLUCOSE 178* 235*  BUN 36* 32*  CREATININE 1.09* 1.18*  CALCIUM 8.4* 8.5*   GFR: Estimated Creatinine Clearance: 30.1 mL/min (A) (by C-G formula based on SCr of 1.18 mg/dL (H)). Liver Function Tests: Recent Labs  Lab 11/20/22 1120 11/25/22 1206  AST 20 21  ALT 15 13  ALKPHOS 96 77  BILITOT 0.9 0.8  PROT 6.1* 5.7*  ALBUMIN 3.7 3.5   No results for input(s): "LIPASE", "AMYLASE" in the last 168 hours. No results for input(s): "AMMONIA" in the last 168 hours. Coagulation Profile: No results for input(s): "INR", "PROTIME" in the last 168 hours. Cardiac Enzymes: No results for input(s): "CKTOTAL", "CKMB", "CKMBINDEX", "TROPONINI" in the last 168 hours. BNP (last 3 results) No results for input(s): "PROBNP" in the last 8760 hours. HbA1C: No results for input(s): "HGBA1C" in the last 72 hours. CBG: No results for input(s): "GLUCAP" in the last 168 hours. Lipid  Profile: No results for input(s): "CHOL", "HDL", "LDLCALC", "TRIG", "CHOLHDL", "LDLDIRECT" in the last 72 hours. Thyroid Function Tests: No results for input(s): "TSH", "T4TOTAL", "FREET4", "T3FREE", "THYROIDAB" in the last 72 hours. Anemia Panel: Recent Labs    11/25/22 1056  FOLATE 34.0  RETICCTPCT 3.3*   Urine analysis:    Component Value Date/Time   COLORURINE YELLOW (A) 06/20/2022 1212   APPEARANCEUR CLEAR (A) 06/20/2022 1212   LABSPEC 1.010 06/20/2022 1212   PHURINE 5.0 06/20/2022 1212   GLUCOSEU NEGATIVE 06/20/2022 1212   HGBUR NEGATIVE 06/20/2022 1212   BILIRUBINUR NEGATIVE 06/20/2022 1212   BILIRUBINUR negative 05/26/2019 1558   KETONESUR NEGATIVE 06/20/2022 1212   PROTEINUR NEGATIVE 06/20/2022 1212   UROBILINOGEN 0.2 05/26/2019 1558   UROBILINOGEN 1.0 05/25/2009 0717   NITRITE NEGATIVE 06/20/2022 1212   LEUKOCYTESUR NEGATIVE 06/20/2022 1212    Radiological Exams on Admission: I have personally reviewed images No results found.  EKG: I have personally reviewed EKG: NSR, LVH, LBBB  Assessment/Plan Principal Problem:   Melena Active Problems:   Severe aortic stenosis   Diabetes mellitus without complication (HCC)   Benign essential HTN   Hypercholesteremia   Adult hypothyroidism    Assessment and Plan: * Melena Patient with several prior episodes of melena and subsequent blood loss anemia. No origin of bleeding has been determined. Due to severe aortic stenosis and advanced age she is not a candidate for endoscopies or any surgery. She did have capsule endoscopy in the recent past which was unrevealing. Most likely origin AVMs - per Dr. Virgina Jock.  Plan Med-surg obs admit  Transfuse to a Hgb of 9 or greater - target based on risk of continued blood loss  Severe aortic stenosis Long standing problem followed by cardiology. Last ECHO July '23 with EF 55-60% grade II DD, mild-moderate MoV stenosis, severe AoV stenosis. R/L [facility] June '23 also confirmed severe AoV  stenosis. She was evaluated for TAVR and is not a candidate for this intervention.  Plan Continue home cardiac medical regimen  Diabetes mellitus without complication ([place]) Patient takes basal insulin and before meals insulin. Last A1C 10/02/22 was 6.4%  Plan Continue basal insulin  Sliding scale coverage AC while in hospital  Adult hypothyroidism Last TSH 1 month ago = 2.5. She currently takes no replacement.    Hypercholesteremia Last lipid panel was one month ago. HDL 46, LDL 83.  Plan Continue home regimen  Benign essential HTN BP is stable at admission 124/35.  Plan Continue home regimen       DVT prophylaxis:  TED stockings Code Status: DNR/DNI(Do NOT Intubate) Family Communication: daughter present during interview and exam. Fully understands Dx and agrees with Tx plan  Disposition Plan: home when medically stable  Consults called: GI- Dr. Virgina Jock  Admission status: Inpatient, Med-Surg   Adella Hare,  MD [facility] 11/25/2022, 5:22 PM

## 2022-11-25 NOTE — Assessment & Plan Note (Addendum)
Last TSH 1 month ago = 2.5. She currently takes no replacement.

## 2022-11-25 NOTE — ED Provider Notes (Signed)
[facility] Provider Note    Event Date/Time   First MD Initiated Contact with Patient 11/25/22 1237     (approximate)   History   Rectal Bleeding   HPI  Jasmine Webster is a 89 y.o. female with past medical history of coronary artery disease, iron deficiency anemia, severe aortic stenosis, diabetes, pulmonary hypertension, hypertension, hyperlipidemia who presents because of low hemoglobin.  Patient has had almost a week of melena.  She has been feeling weak and fatigued.  She sees oncology and had drop in hemoglobin so sent to the emergency department.  She denies any chest pain currently.  That has some tightness that was very brief yesterday but this is resolved.  Does have some dyspnea on exertion.  Patient has seen by cardiology and was evaluated for TAVR but unfortunately does not have anatomy for TAVR.  He had GI bleeding in the past but EGD/colonoscopy has been deferred because of patient's comorbidities and they do not want to give for anesthesia.     Past Medical History:  Diagnosis Date   Arthritis    Cataract    cataract removal bilaterally approx 20 years ago   Chronic back pain    Coronary artery disease    COVID-19 virus infection 12/2020   Diabetes mellitus without complication ([place])    Heart murmur    Hyperlipidemia    Hypertension     Patient Active Problem List   Diagnosis Date Noted   Melena 11/25/2022   Normocytic anemia 06/26/2022   Iron deficiency anemia 06/26/2022   Anemia in chronic kidney disease (CKD) 06/26/2022   Symptomatic anemia 05/22/2022   Severe aortic stenosis    Acute CHF (congestive heart failure) ([place]) 05/21/2022   NSTEMI (non-ST elevated myocardial infarction) ([place]) 05/21/2022   GERD without esophagitis 05/21/2022   Type 2 diabetes mellitus without complications ([place]) 99/37/1696   Dyslipidemia 05/21/2022   Aortic valve stenosis    Pulmonary hypertension (HCC)    Type 2 diabetes mellitus with diabetic  chronic kidney disease ([place]) 02/20/2022   Heart failure, unspecified ([place]) 02/20/2022   Chronic kidney disease, stage 4 (severe) ([place]) 02/20/2022   Anemia in chronic kidney disease 02/20/2022   Weakness    Hyponatremia 11/26/2021   Hypertension    Coronary artery disease    COVID-19 virus infection 12/2020   Diabetes mellitus without complication (HCC) 78/93/8101   L-S radiculopathy 01/15/2019   Angina pectoris ([place]) 12/28/2018   Mixed hyperlipidemia 12/28/2018   Arthritis 03/23/2015   Cardiovascular disease 03/23/2015   Urinary system disease 03/23/2015   Malignant neoplasm of corpus uteri ([place]) 03/23/2015   Benign essential HTN 03/23/2015   Acid reflux 03/23/2015   Cardiac murmur 03/23/2015   Hypercholesteremia 03/23/2015   Adult hypothyroidism 03/23/2015   Cannot sleep 03/23/2015   Malaise and fatigue 03/23/2015   Muscle ache 03/23/2015   Arthritis, degenerative 03/23/2015     Physical Exam  Triage Vital Signs: ED Triage Vitals  Enc Vitals Group     BP 11/25/22 1201 (!) 129/44     Pulse Rate 11/25/22 1201 76     Resp 11/25/22 1201 18     Temp 11/25/22 1201 97.7 F (36.5 C)     Temp src --      SpO2 11/25/22 1201 100 %     Weight 11/25/22 1155 144 lb (65.3 kg)     Height 11/25/22 1155 6' 4.5" (1.943 m)     Head Circumference --  Peak Flow --      Pain Score 11/25/22 1154 0     Pain Loc --      Pain Edu? --      Excl. in [place]? --     Most recent vital signs: Vitals:   11/25/22 1400 11/25/22 1430  BP: (!) 141/45 (!) 132/41  Pulse: 74 72  Resp: 14 (!) 23  Temp:    SpO2: 100% 100%     General: Awake, no distress.  CV:  Good peripheral perfusion.  Resp:  Normal effort.  Abd:  No distention.  Neuro:             Awake, Alert, Oriented x 3  Other:  Dark maroon stool on rectal exam   ED Results / Procedures / Treatments  Labs (all labs ordered are listed, but only abnormal results are displayed) Labs Reviewed  COMPREHENSIVE METABOLIC PANEL -  Abnormal; Notable for the following components:      Result Value   CO2 21 (*)    Glucose, Bld 235 (*)    BUN 32 (*)    Creatinine, Ser 1.18 (*)    Calcium 8.5 (*)    Total Protein 5.7 (*)    GFR, Estimated 43 (*)    All other components within normal limits  CBC - Abnormal; Notable for the following components:   RBC 2.73 (*)    Hemoglobin 8.5 (*)    HCT 27.7 (*)    MCV 101.5 (*)    All other components within normal limits  TROPONIN I (HIGH SENSITIVITY) - Abnormal; Notable for the following components:   Troponin I (High Sensitivity) 19 (*)    All other components within normal limits  POC OCCULT BLOOD, ED  PREPARE RBC (CROSSMATCH)  TYPE AND SCREEN  TROPONIN I (HIGH SENSITIVITY)     EKG   EKG reviewed interpreted myself shows a left bundle branch block no scar Bosa criteria to suggest ischemia, left axis deviation RADIOLOGY    PROCEDURES:  Critical Care performed: Yes, see critical care procedure note(s)  .Critical Care  Performed by: Rada Hay, MD Authorized by: Rada Hay, MD   Critical care provider statement:    Critical care time (minutes):  30   Critical care was time spent personally by me on the following activities:  Development of treatment plan with patient or surrogate, discussions with consultants, evaluation of patient's response to treatment, examination of patient, ordering and review of laboratory studies, ordering and review of radiographic studies, ordering and performing treatments and interventions, pulse oximetry, re-evaluation of patient's condition and review of old charts   The patient is on the cardiac monitor to evaluate for evidence of arrhythmia and/or significant heart rate changes.   MEDICATIONS ORDERED IN ED: Medications  pantoprozole (PROTONIX) 80 mg /NS 100 mL infusion (8 mg/hr Intravenous New Bag/Given 11/25/22 1435)  pantoprazole (PROTONIX) injection 40 mg (has no administration in time range)  0.9 %  sodium  chloride infusion (has no administration in time range)  sodium chloride 0.9 % bolus 500 mL (has no administration in time range)  pantoprazole (PROTONIX) 80 mg /NS 100 mL IVPB (80 mg Intravenous New Bag/Given 11/25/22 1404)     IMPRESSION / MDM / [place] / ED COURSE  I reviewed the triage vital signs and the nursing notes.  Patient's presentation is most consistent with acute presentation with potential threat to life or bodily function.  Differential diagnosis includes, but is not limited to, upper GI bleed secondary to peptic ulcer, gastritis, esophagitis  Patient is a 89 year old with severe AS and history of iron deficiency anemia who presents because of low hemoglobin.  Hemoglobin has dropped to 7.9 on outpatient labs previously was around 10.  Patient has history of GI bleeding requiring transfusions unfortunately she is not a candidate for TAVR and so will not be getting her aortic valve operated on and previously anesthesia has not wanted to put her under due to her cardiac status.  Patient not have any chest pain currently.  Had a brief episode yesterday and has chronic chest pain with exertion but this is unchanged.  Blood pressures on the softer side 120s over 30s.  She looks somewhat pale.  She has dark maroon stool on rectal exam.  Hemoglobin here is 8.5.  Was 7.92 hours ago.  BUN slightly elevated in the 30s.  Discussed with Dr. Virgina Jock who will see the patient, but recommends conservative management with transfusion and monitoring in the hospital.  Will start Protonix.  I will give a small saline bolus and have ordered a unit of blood.  Initial EKG was concerning to me with some ST elevation in 1 and aVL, however I repeated it and it looks much more normal.  She is not having any active chest pain.  Troponin just mildly elevtaed and has been higher in the past.    Patient seen by Dr. Virgina Jock.  He thinks it is best to keep her here as there is no  guarantee she would actually get an endoscopy and her age is quite risky.  She continues to have slightly low blood pressure but overall is hemodynamically stable.  I have ordered a unit of blood to be transfused and 500 cc bolus.  Patient's family is agreeable to plan.   FINAL CLINICAL IMPRESSION(S) / ED DIAGNOSES   Final diagnoses:  Symptomatic anemia  Gastrointestinal hemorrhage, unspecified gastrointestinal hemorrhage type     Rx / DC Orders   ED Discharge Orders     None        Note:  This document was prepared using Dragon voice recognition software and may include unintentional dictation errors.   Rada Hay, MD 11/25/22 [phone]

## 2022-11-25 NOTE — Assessment & Plan Note (Signed)
Likely the cause of Anemia. 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  Due to her risk of cardiac issues, local anesthesia providers are concerned and she is not able to get encoscope for evaluation. She will need to go to [facility] or [facility] for evaluation.  I discussed the option of going to [facility] or [facility] ER.  Daughter will take patient to [facility] ER to get blood transfusion and stabilize her hemoglobin first and plan establish care at [facility] outpatient.

## 2022-11-25 NOTE — Consult Note (Signed)
Inpatient Consultation   Patient ID: Jasmine Webster is a 89 y.o. female.  Requesting Provider: Rada Hay, MD  Date of Admission: 11/25/2022  Date of Consult: 11/25/22   Reason for Consultation: melena, symptomatic anemia   Patient's Chief Complaint:   Chief Complaint  Patient presents with   Rectal Bleeding    89 year old Caucasian female with severe aortic stenosis, DM2, CAD who presents from her hematologist office after complaining of weakness and discovered to have a hemoglobin of 7.9.  She is accompanied at bedside with her daughter who helps provide history.  Patient and daughter note the patient has had dark stools over the last several days.  She denies any NSAID use and is not taking any anticoagulants.  She has been feeling weak over the last few days.  Was evaluated for TAVR in the past, but was deemed not a candidate.  While she has had GI bleeding in the past, anesthesia at this facility has previously recommended the patient have her procedures done elsewhere given her elevated risk with her significant aortic stenosis.  Repeat hemoglobin is 8.5 without intervention.  Her vital signs are remained stable with heart rate in the 70s and SBP at evaluation of 140s.  Notably macrocytic with an MCV of 101. BUN/Cr ratio elevated at 27.  Denies any hematochezia or hematemesis  Denies NSAIDs, Anti-plt agents, and anticoagulants Denies family history of gastrointestinal disease and malignancy Previous Endoscopies: Csy 2008- normal per pt- full report not available EGD 2008- per pt report, report not available.   Past Medical History:  Diagnosis Date   Arthritis    Cataract    cataract removal bilaterally approx 20 years ago   Chronic back pain    Coronary artery disease    COVID-19 virus infection 12/2020   Diabetes mellitus without complication (HCC)    Heart murmur    Hyperlipidemia    Hypertension     Past Surgical History:  Procedure Laterality  Date   ABDOMINAL HYSTERECTOMY     APPENDECTOMY     BACK SURGERY     three   CARDIAC CATHETERIZATION     6+yrs no stents Dr. Verneda Skill   CATARACT EXTRACTION     RIGHT/LEFT HEART CATH AND CORONARY ANGIOGRAPHY N/A 04/26/2022   Procedure: RIGHT/LEFT HEART CATH AND CORONARY ANGIOGRAPHY;  Surgeon: Wellington Hampshire, MD;  Location: [facility];  Service: Cardiovascular;  Laterality: N/A;   THORACOLUMBAR SYRINGO SHUNT     TONSILLECTOMY AND ADENOIDECTOMY      Allergies  Allergen Reactions   Etodolac Nausea Only, Other (See Comments) and Anxiety    Dizziness, too   Naproxen Other (See Comments) and Anxiety    Gave her an ulcer after taking it for years   Statins Other (See Comments)    No energy and myalgias   Latex Rash   Terbinafine Other (See Comments) and Rash    "Maybe made me nervous"   Terbinafine Hcl Rash    Family History  Problem Relation Age of Onset   Heart disease Mother        died from MI   Heart attack Mother    Heart disease Sister    Sudden death Brother        shot and beaten to death during home invasion   Stroke Sister    Sudden death Sister        MVA   Cancer Brother        died from  lung cancer   Sudden death Sister        68 days old   Cancer Brother        died from lung cancer   Stroke Brother        cause of death   Diabetes Father    Stroke Father    Pneumonia Father     Social History   Tobacco Use   Smoking status: Never   Smokeless tobacco: Never  Vaping Use   Vaping Use: Never used  Substance Use Topics   Alcohol use: No    Alcohol/week: 0.0 standard drinks of alcohol   Drug use: No     Pertinent GI related history and allergies were reviewed with the patient  Review of Systems  Constitutional:  Positive for fatigue. Negative for activity change, appetite change, chills, diaphoresis, fever and unexpected weight change.  HENT:  Negative for trouble swallowing and voice change.   Respiratory:  Positive for  shortness of breath. Negative for wheezing.   Cardiovascular:  Negative for chest pain, palpitations and leg swelling.  Gastrointestinal:  Positive for blood in stool (melena). Negative for abdominal distention, abdominal pain, anal bleeding, constipation, diarrhea, nausea, rectal pain and vomiting.  Musculoskeletal:  Negative for arthralgias and myalgias.  Skin:  Negative for color change and pallor.  Neurological:  Positive for weakness. Negative for syncope.  Psychiatric/Behavioral:  Negative for confusion.   All other systems reviewed and are negative.    Medications Home Medications No current facility-administered medications on file prior to encounter.   Current Outpatient Medications on File Prior to Encounter  Medication Sig Dispense Refill   ALPRAZolam (XANAX) 0.5 MG tablet TAKE 1 TABLET BY MOUTH EVERY DAY AT BEDTIME AS NEEDED 30 tablet 4   Artificial Tear Ointment (DRY EYES OP) Apply 1 drop to eye daily.     aspirin EC 81 MG tablet Take 81 mg by mouth daily.     co-enzyme Q-10 30 MG capsule Take 30 mg by mouth daily.     ezetimibe (ZETIA) 10 MG tablet TAKE 1 TABLET BY MOUTH EVERY DAY 30 tablet 11   furosemide (LASIX) 20 MG tablet Take 1 tablet (20 mg total) by mouth every other day. 45 tablet 0   insulin glargine-yfgn (SEMGLEE, YFGN,) 100 UNIT/ML Pen INJECT 18 UNITS SUBCUTANEOUSLY 9 mL 2   lisinopril (ZESTRIL) 10 MG tablet TAKE 1 TABLET BY MOUTH EVERY DAY 30 tablet 11   Magnesium 400 MG CAPS Take 400 mg by mouth daily.     Multiple Vitamins-Minerals (PRESERVISION AREDS) CAPS Take 1 capsule by mouth 2 (two) times daily.      Omega-3 Fatty Acids (FISH OIL PO) Take by mouth daily.     omeprazole (PRILOSEC) 40 MG capsule TAKE 1 CAPSULE (40 MG TOTAL) BY MOUTH DAILY. 30 capsule 11   pantoprazole (PROTONIX) 40 MG tablet Take 1 tablet (40 mg total) by mouth 2 (two) times daily before a meal. 60 tablet 1   tobramycin (TOBREX) 0.3 % ophthalmic solution Place into the left eye.      Turmeric 500 MG CAPS Take by mouth daily.     vitamin B-12 (CYANOCOBALAMIN) 500 MCG tablet Take 500 mcg by mouth once a week.     Insulin Pen Needle (B-D ULTRAFINE III SHORT PEN) 31G X 8 MM MISC Use for administering glucose twice daily 200 each 6   nitroGLYCERIN (NITROSTAT) 0.4 MG SL tablet Place 1 tablet (0.4 mg total) under the tongue every 5 (five) minutes  as needed for chest pain. 25 tablet 3   ONETOUCH ULTRA test strip CHECK SUGAR TWICE A DAY 200 strip 3   Pertinent GI related medications were reviewed with the patient  Inpatient Medications  Current Facility-Administered Medications:    0.9 %  sodium chloride infusion, 10 mL/hr, Intravenous, Once, Rada Hay, MD   Derrill Memo ON 11/29/2022] pantoprazole (PROTONIX) injection 40 mg, 40 mg, Intravenous, Q12H, McHugh, Jennette Dubin, MD   pantoprozole (PROTONIX) 80 mg /NS 100 mL infusion, 8 mg/hr, Intravenous, Continuous, Rada Hay, MD, Last Rate: 10 mL/hr at 11/25/22 1435, 8 mg/hr at 11/25/22 1435   sodium chloride 0.9 % bolus 500 mL, 500 mL, Intravenous, Once, Rada Hay, MD  Current Outpatient Medications:    ALPRAZolam (XANAX) 0.5 MG tablet, TAKE 1 TABLET BY MOUTH EVERY DAY AT BEDTIME AS NEEDED, Disp: 30 tablet, Rfl: 4   Artificial Tear Ointment (DRY EYES OP), Apply 1 drop to eye daily., Disp: , Rfl:    aspirin EC 81 MG tablet, Take 81 mg by mouth daily., Disp: , Rfl:    co-enzyme Q-10 30 MG capsule, Take 30 mg by mouth daily., Disp: , Rfl:    ezetimibe (ZETIA) 10 MG tablet, TAKE 1 TABLET BY MOUTH EVERY DAY, Disp: 30 tablet, Rfl: 11   furosemide (LASIX) 20 MG tablet, Take 1 tablet (20 mg total) by mouth every other day., Disp: 45 tablet, Rfl: 0   insulin glargine-yfgn (SEMGLEE, YFGN,) 100 UNIT/ML Pen, INJECT 18 UNITS SUBCUTANEOUSLY, Disp: 9 mL, Rfl: 2   lisinopril (ZESTRIL) 10 MG tablet, TAKE 1 TABLET BY MOUTH EVERY DAY, Disp: 30 tablet, Rfl: 11   Magnesium 400 MG CAPS, Take 400 mg by mouth daily., Disp: , Rfl:     Multiple Vitamins-Minerals (PRESERVISION AREDS) CAPS, Take 1 capsule by mouth 2 (two) times daily. , Disp: , Rfl:    Omega-3 Fatty Acids (FISH OIL PO), Take by mouth daily., Disp: , Rfl:    omeprazole (PRILOSEC) 40 MG capsule, TAKE 1 CAPSULE (40 MG TOTAL) BY MOUTH DAILY., Disp: 30 capsule, Rfl: 11   pantoprazole (PROTONIX) 40 MG tablet, Take 1 tablet (40 mg total) by mouth 2 (two) times daily before a meal., Disp: 60 tablet, Rfl: 1   tobramycin (TOBREX) 0.3 % ophthalmic solution, Place into the left eye., Disp: , Rfl:    Turmeric 500 MG CAPS, Take by mouth daily., Disp: , Rfl:    vitamin B-12 (CYANOCOBALAMIN) 500 MCG tablet, Take 500 mcg by mouth once a week., Disp: , Rfl:    Insulin Pen Needle (B-D ULTRAFINE III SHORT PEN) 31G X 8 MM MISC, Use for administering glucose twice daily, Disp: 200 each, Rfl: 6   nitroGLYCERIN (NITROSTAT) 0.4 MG SL tablet, Place 1 tablet (0.4 mg total) under the tongue every 5 (five) minutes as needed for chest pain., Disp: 25 tablet, Rfl: 3   ONETOUCH ULTRA test strip, CHECK SUGAR TWICE A DAY, Disp: 200 strip, Rfl: 3  sodium chloride     pantoprazole 8 mg/hr (11/25/22 1435)   sodium chloride         Objective   Vitals:   11/25/22 1300 11/25/22 1330 11/25/22 1400 11/25/22 1430  BP: (!) 116/39 (!) 119/36 (!) 141/45 (!) 132/41  Pulse: 68 69 74 72  Resp: '17 14 14 '$ (!) 23  Temp:      SpO2: 100% 100% 100% 100%  Weight:      Height:         Physical Exam Vitals and  nursing note reviewed.  Constitutional:      General: She is not in acute distress.    Appearance: Normal appearance. She is not ill-appearing, toxic-appearing or diaphoretic.     Comments: Frail appearing  HENT:     Head: Normocephalic and atraumatic.     Nose: Nose normal.     Mouth/Throat:     Mouth: Mucous membranes are moist.     Pharynx: Oropharynx is clear.  Eyes:     General: No scleral icterus.    Extraocular Movements: Extraocular movements intact.  Cardiovascular:     Rate and  Rhythm: Normal rate and regular rhythm.     Heart sounds: Murmur heard.     No friction rub. No gallop.  Pulmonary:     Effort: Pulmonary effort is normal. No respiratory distress.     Breath sounds: Normal breath sounds. No wheezing, rhonchi or rales.  Abdominal:     General: Abdomen is flat. Bowel sounds are normal. There is no distension.     Palpations: Abdomen is soft.     Tenderness: There is no abdominal tenderness. There is no guarding or rebound.  Musculoskeletal:     Cervical back: Neck supple.     Right lower leg: No edema.     Left lower leg: No edema.  Skin:    General: Skin is warm and dry.     Coloration: Skin is pale. Skin is not jaundiced.  Neurological:     General: No focal deficit present.     Mental Status: She is alert and oriented to person, place, and time. Mental status is at baseline.  Psychiatric:        Mood and Affect: Mood normal.        Behavior: Behavior normal.        Thought Content: Thought content normal.        Judgment: Judgment normal.     Laboratory Data Recent Labs  Lab 11/20/22 1120 11/25/22 1056 11/25/22 1206  WBC 5.5 5.9 10.2  HGB 10.4* 7.9* 8.5*  HCT 32.8* 24.7* 27.7*  PLT 203 219 PLATELET CLUMPS NOTED ON SMEAR, UNABLE TO ESTIMATE   Recent Labs  Lab 11/20/22 1120 11/25/22 1206  NA 134* 135  K 5.1 4.3  CL 103 104  CO2 25 21*  BUN 36* 32*  CALCIUM 8.4* 8.5*  PROT 6.1* 5.7*  BILITOT 0.9 0.8  ALKPHOS 96 77  ALT 15 13  AST 20 21  GLUCOSE 178* 235*   No results for input(s): "INR" in the last 168 hours.  No results for input(s): "LIPASE" in the last 72 hours.      Imaging Studies: No results found.  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 - pt has noted melena - bun/cr ratio elevated - no current nsaid use - hgb 8,5 - ddx does include but not limited to gastritis, duodenitis, malignancy, but this would not change  management and was d/w pt and her daughter  # macrocytic and acute blood loss anemia # generalized weakness # Severe Aortic Stenosis # CKD4 # HFpEF - grade 2 diastolic dysfunction   Plan:  I discussed the risks and benefits of performing EGD with the patient and her daughter As discussed in the past given her significant aortic stenosis, the patient is high risk for sedation and anesthesia has recommended patient have this performed at a tertiary facility. Pt was evaluated  for tavr previously and felt not to be a canidate This time I do not think she needs to be transferred as her vital signs are stable. Would continue to Monitor H&H.  Transfusion and resuscitation as per primary team. Goal hgb >8 Avoid frequent lab draws to prevent lab induced anemia Continue Protonix 40 mg iv q12 h Clear liquids now Continue iron supplementation with ascorbic acid Hold dvt ppx Supportive care and antiemetics as per primary team Maintain two sites IV access Avoid nsaids Monitor for GIB.  Pt and daughter in agreeance with plan.  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  Even if 1 area were treated there may be multiple other areas or likely recurrence and patient may be transfusion dependent regardless  Should patient become hemodynamically unstable would recommend stat CTA to evaluate for source localization and possible embolization if indicated  I personally performed the service.  Management of other medical comorbidities as per primary team  Thank you for allowing [name] to participate in this patient's care. Please don't hesitate to call if any questions or concerns arise.   Annamaria Helling, DO [facility]  Portions of the record may have been created with voice recognition software. Occasional wrong-word or 'sound-a-like' substitutions may have occurred due to the inherent  limitations of voice recognition software.  Read the chart carefully and recognize, using context, where substitutions may have occurred.

## 2022-11-25 NOTE — Assessment & Plan Note (Signed)
BP is stable at admission 124/35.  Plan Continue home regimen

## 2022-11-25 NOTE — Assessment & Plan Note (Signed)
Long standing problem followed by cardiology. Last ECHO July '23 with EF 55-60% grade II DD, mild-moderate MoV stenosis, severe AoV stenosis. R/L [facility] June '23 also confirmed severe AoV stenosis. She was evaluated for TAVR and is not a candidate for this intervention.  Plan Continue home cardiac medical regimen

## 2022-11-25 NOTE — Assessment & Plan Note (Addendum)
Patient with several prior episodes of melena and subsequent blood loss anemia. No origin of bleeding has been determined. Due to severe aortic stenosis and advanced age she is not a candidate for endoscopies or any surgery. She did have capsule endoscopy in the recent past which was unrevealing. Most likely origin AVMs - per Dr. Virgina Jock.  Plan Med-surg obs admit  Transfuse to a Hgb of 9 or greater - target based on risk of continued blood loss

## 2022-11-25 NOTE — Subjective & Objective (Signed)
Jasmine Webster, a 89 y/o widow with a h/o several episodes of melena and blood loss anemia reports that she has had melena for the week prior to admission with progressive weakness. Today her daughter found her on the floor to weak to get up. Other medical problems include HTN, HLD, hypothyroidism, severe aortic stenosis, DM, CAD. She was brought to [facility] for evaluation of weakness and melena. Patient does admit to having had an episode of chest pain with exertion the day prior to admission.

## 2022-11-25 NOTE — Progress Notes (Signed)
Hematology/Oncology Progress note [phone] [phone]         Patient Care Team: Jasmine Foster, MD as PCP - General (Family Medicine) Jasmine Merritts, MD as PCP - Cardiology (Cardiology) Jasmine Robson, MD as Referring Physician (Ophthalmology) Jasmine Webster, DPM as Consulting Physician (Podiatry) Jasmine Webster, Jasmine November, MD as Consulting Physician (Cardiology)   REFERRING PROVIDER: Donnal Webster*  CHIEF COMPLAINTS/REASON FOR VISIT:  Anemia  ASSESSMENT & PLAN:  Iron deficiency anemia Labs are reviewed and discussed with patient. Acute drop of Hb, 3 g within less than 1 week, suspect active GI bleeding. I recommend patient to go to ER for further evaluation and blood transfusion.  She was initially hesitant but eventually agrees with the plan. Her daughter will take her to ER.  I called ER Triage RN.    Melena Likely the cause of Anemia. 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  Due to her risk of cardiac issues, local anesthesia providers are concerned and she is not able to get encoscope for evaluation. She will need to go to [facility] or [facility] for evaluation.  I discussed the option of going to [facility] or [facility] ER.  Daughter will take patient to [facility] ER to get blood transfusion and stabilize her hemoglobin first and plan establish care at [facility] outpatient.   Orders Placed This Encounter  Procedures   CBC with Differential/Platelet    Standing Status:   Future    Number of Occurrences:   1    Standing Expiration Date:   11/25/2023   Retic Panel    Standing Status:   Future    Number of Occurrences:   1    Standing Expiration Date:   11/26/2023   Vitamin B12    Standing Status:   Future    Number of Occurrences:   1    Standing Expiration Date:   11/26/2023   Folate    Standing Status:   Future    Number of Occurrences:   1    Standing Expiration Date:    11/26/2023   Hold Tube- Blood Bank    Standing Status:   Future    Number of Occurrences:   1    Standing Expiration Date:   11/26/2023   Follow-up -ER evaluation.  All questions were answered. The patient knows to call the clinic with any problems, questions or concerns.  Jasmine Server, MD, PhD [facility] Hematology Oncology 11/25/2022     HISTORY OF PRESENTING ILLNESS:  Jasmine Webster is a  89 y.o.  female with PMH listed below who was referred to me for anemia Reviewed patient's recent labs that was done.   Reviewed patient's previous labs ordered by primary care physician's office, anemia is chronic onset , duration is since January 2023. Patient reports very fatigued, lightheaded, shortness of breath with minimal exertion. She denies recent chest pain on exertion, pre-syncopal episodes, or palpitations She had not noticed any recent bleeding such as epistaxis, hematuria or hematochezia.  +Dark stool She denies over the counter NSAID ingestion. She is on antiplatelets agent-aspirin 81 mg. She denies any pica and eats a variety of diet.  She was hospitalized from 05/21/2022 - 05/24/2022, due to NSTEMI and acute CHF exacerbation, symptomatic anemia, status post 2 unit of PRBC transfusion.  Patient reports feeling better after blood transfusion. Cardiology recommends no invasive intervention during this admission.  There is plan for possible TAVR   INTERVAL HISTORY  Jasmine Webster is a 89 y.o. female who has above history reviewed by me today presents for acute visit for iron deficiency anemia, fatigue and weakness Patient's daughter called earlier as patient feels very weak.  Cbc done last week when she saw cardiology was 10.4. iron panel normal.  Patient was seen urgent for evaluation today.  She reports having dark stool x  1 week  very fatigued and weak  MEDICAL HISTORY:  Past Medical History:  Diagnosis Date   Arthritis    Cataract    cataract removal bilaterally approx 20 years  ago   Chronic back pain    Coronary artery disease    COVID-19 virus infection 12/2020   Diabetes mellitus without complication (HCC)    Heart murmur    Hyperlipidemia    Hypertension     SURGICAL HISTORY: Past Surgical History:  Procedure Laterality Date   ABDOMINAL HYSTERECTOMY     APPENDECTOMY     BACK SURGERY     three   CARDIAC CATHETERIZATION     6+yrs no stents Dr. Verneda Skill   CATARACT EXTRACTION     RIGHT/LEFT HEART CATH AND CORONARY ANGIOGRAPHY N/A 04/26/2022   Procedure: RIGHT/LEFT HEART CATH AND CORONARY ANGIOGRAPHY;  Surgeon: Wellington Hampshire, MD;  Location: [place] [facility];  Service: Cardiovascular;  Laterality: N/A;   THORACOLUMBAR SYRINGO SHUNT     TONSILLECTOMY AND ADENOIDECTOMY      SOCIAL HISTORY: Social History   Socioeconomic History   Marital status: Widowed    Spouse name: Not on file   Number of children: 2   Years of education: Not on file   Highest education level: 8th grade  Occupational History   Occupation: retired  Tobacco Use   Smoking status: Never   Smokeless tobacco: Never  Vaping Use   Vaping Use: Never used  Substance and Sexual Activity   Alcohol use: No    Alcohol/week: 0.0 standard drinks of alcohol   Drug use: No   Sexual activity: Not Currently  Other Topics Concern   Not on file  Social History Narrative   Lives with daughter, Katherina Right   Social Determinants of Health   Financial Resource Strain: Low Risk  (09/11/2022)   Overall Financial Resource Strain (CARDIA)    Difficulty of Paying Living Expenses: Not hard at all  Food Insecurity: No Food Insecurity (09/11/2022)   Hunger Vital Sign    Worried About Running Out of Food in the Last Year: Never true    [place] in the Last Year: Never true  Transportation Needs: No Transportation Needs (09/11/2022)   PRAPARE - Hydrologist (Medical): No    Lack of Transportation (Non-Medical): No  Physical Activity:  Insufficiently Active (09/11/2022)   Exercise Vital Sign    Days of Exercise per Week: 2 days    Minutes of Exercise per Session: 20 min  Stress: No Stress Concern Present (09/11/2022)   [place]    Feeling of Stress : Not at all  Social Connections: Moderately Isolated (09/11/2022)   Social Connection and Isolation Panel [NHANES]    Frequency of Communication with Friends and Family: More than three times a week    Frequency of Social Gatherings with Friends and Family: Twice a week    Attends Religious Services: More than 4 times per year    Active Member of [employer] or Organizations: No    Attends [employer]  or Organization Meetings: Never    Marital Status: Widowed  Intimate Partner Violence: Not At Risk (09/11/2022)   Humiliation, Afraid, Rape, and Kick questionnaire    Fear of Current or Ex-Partner: No    Emotionally Abused: No    Physically Abused: No    Sexually Abused: No    FAMILY HISTORY: Family History  Problem Relation Age of Onset   Heart disease Mother        died from MI   Heart attack Mother    Heart disease Sister    Sudden death Brother        shot and beaten to death during home invasion   Stroke Sister    Sudden death Sister        MVA   Cancer Brother        died from lung cancer   Sudden death Sister        45 days old   Cancer Brother        died from lung cancer   Stroke Brother        cause of death   Diabetes Father    Stroke Father    Pneumonia Father     ALLERGIES:  is allergic to etodolac, naproxen, statins, latex, terbinafine, and terbinafine hcl.  MEDICATIONS:  Current Outpatient Medications  Medication Sig Dispense Refill   ALPRAZolam (XANAX) 0.5 MG tablet TAKE 1 TABLET BY MOUTH EVERY DAY AT BEDTIME AS NEEDED 30 tablet 4   Artificial Tear Ointment (DRY EYES OP) Apply 1 drop to eye daily.     aspirin EC 81 MG tablet Take 81 mg by mouth daily.     co-enzyme Q-10 30 MG  capsule Take 30 mg by mouth daily.     ezetimibe (ZETIA) 10 MG tablet TAKE 1 TABLET BY MOUTH EVERY DAY 30 tablet 11   furosemide (LASIX) 20 MG tablet Take 1 tablet (20 mg total) by mouth every other day. 45 tablet 0   insulin glargine-yfgn (SEMGLEE, YFGN,) 100 UNIT/ML Pen INJECT 18 UNITS SUBCUTANEOUSLY 9 mL 2   Insulin Pen Needle (B-D ULTRAFINE III SHORT PEN) 31G X 8 MM MISC Use for administering glucose twice daily 200 each 6   lisinopril (ZESTRIL) 10 MG tablet TAKE 1 TABLET BY MOUTH EVERY DAY 30 tablet 11   Magnesium 400 MG CAPS Take 400 mg by mouth daily.     Multiple Vitamins-Minerals (PRESERVISION AREDS) CAPS Take 1 capsule by mouth 2 (two) times daily.      nitroGLYCERIN (NITROSTAT) 0.4 MG SL tablet Place 1 tablet (0.4 mg total) under the tongue every 5 (five) minutes as needed for chest pain. 25 tablet 3   Omega-3 Fatty Acids (FISH OIL PO) Take by mouth daily.     omeprazole (PRILOSEC) 40 MG capsule TAKE 1 CAPSULE (40 MG TOTAL) BY MOUTH DAILY. 30 capsule 11   ONETOUCH ULTRA test strip CHECK SUGAR TWICE A DAY 200 strip 3   pantoprazole (PROTONIX) 40 MG tablet Take 1 tablet (40 mg total) by mouth 2 (two) times daily before a meal. 60 tablet 1   tobramycin (TOBREX) 0.3 % ophthalmic solution Place into the left eye.     Turmeric 500 MG CAPS Take by mouth daily.     vitamin B-12 (CYANOCOBALAMIN) 500 MCG tablet Take 500 mcg by mouth once a week.     No current facility-administered medications for this visit.    Review of Systems  Constitutional:  Positive for fatigue. Negative for chills and fever.  HENT:   Negative for hearing loss and voice change.   Eyes:  Negative for eye problems.  Respiratory:  Negative for chest tightness, cough and shortness of breath.   Cardiovascular:  Negative for chest pain.  Gastrointestinal:  Negative for abdominal distention and abdominal pain.       Dark stool  Endocrine: Negative for hot flashes.  Genitourinary:  Negative for difficulty urinating and  frequency.   Musculoskeletal:  Negative for arthralgias.  Skin:  Negative for itching and rash.  Neurological:  Positive for light-headedness. Negative for extremity weakness.  Hematological:  Negative for adenopathy.  Psychiatric/Behavioral:  Negative for confusion.     PHYSICAL EXAMINATION: ECOG PERFORMANCE STATUS: 2 - Symptomatic, <50% confined to bed Vitals:   11/25/22 1107  BP: (!) 114/40  Pulse: 78  Temp: (!) 96.9 F (36.1 C)  SpO2: 100%   Filed Weights   11/25/22 1107  Weight: 148 lb 3.2 oz (67.2 kg)    Physical Exam Constitutional:      General: She is not in acute distress.    Comments: Patient sits in a wheelchair  HENT:     Head: Normocephalic and atraumatic.  Eyes:     General: No scleral icterus. Cardiovascular:     Rate and Rhythm: Normal rate and regular rhythm.     Heart sounds: Murmur heard.  Pulmonary:     Effort: Pulmonary effort is normal. No respiratory distress.     Breath sounds: No wheezing.  Abdominal:     General: Bowel sounds are normal. There is no distension.     Palpations: Abdomen is soft.  Musculoskeletal:        General: No deformity. Normal range of motion.     Cervical back: Normal range of motion and neck supple.     Comments: Trace edema bilaterally  Skin:    General: Skin is warm and dry.     Coloration: Skin is pale.     Findings: No erythema or rash.  Neurological:     Mental Status: She is alert and oriented to person, place, and time. Mental status is at baseline.     Cranial Nerves: No cranial nerve deficit.  Psychiatric:        Mood and Affect: Mood normal.      LABORATORY DATA:  I have reviewed the data as listed    Latest Ref Rng & Units 11/25/2022   10:56 AM 11/20/2022   11:20 AM 10/02/2022    1:05 PM  CBC  WBC 4.0 - 10.5 K/uL 5.9  5.5  5.7   Hemoglobin 12.0 - 15.0 g/dL 7.9  10.4  11.6   Hematocrit 36.0 - 46.0 % 24.7  32.8  36.2   Platelets 150 - 400 K/uL 219  203  CANCELED       Latest Ref Rng & Units  11/25/2022   12:06 PM 11/20/2022   11:20 AM 10/02/2022    1:05 PM  CMP  Glucose 70 - 99 mg/dL 235  178  164   BUN 8 - 23 mg/dL 32  36  24   Creatinine 0.44 - 1.00 mg/dL 1.18  1.09  1.16   Sodium 135 - 145 mmol/L 135  134  140   Potassium 3.5 - 5.1 mmol/L 4.3  5.1  5.0   Chloride 98 - 111 mmol/L 104  103  102   CO2 22 - 32 mmol/L '21  25  24   '$ Calcium 8.9 - 10.3 mg/dL 8.5  8.4  9.3   Total Protein 6.5 - 8.1 g/dL 5.7  6.1  6.1   Total Bilirubin 0.3 - 1.2 mg/dL 0.8  0.9  0.3   Alkaline Phos 38 - 126 U/L 77  96  134   AST 15 - 41 U/L '21  20  18   '$ ALT 0 - 44 U/L '13  15  13       '$ Component Value Date/Time   IRON 74 11/20/2022 1130   IRON 36 12/04/2021 1302   TIBC 361 11/20/2022 1130   TIBC 327 12/04/2021 1302   FERRITIN 27 11/20/2022 1120   FERRITIN 115 10/02/2022 1305   IRONPCTSAT 21 11/20/2022 1130   IRONPCTSAT 11 (L) 12/04/2021 1302     RADIOGRAPHIC STUDIES: I have personally reviewed the radiological images as listed and agreed with the findings in the report. No results found.

## 2022-11-25 NOTE — Assessment & Plan Note (Signed)
Labs are reviewed and discussed with patient. Acute drop of Hb, 3 g within less than 1 week, suspect active GI bleeding. I recommend patient to go to ER for further evaluation and blood transfusion.  She was initially hesitant but eventually agrees with the plan. Her daughter will take her to ER.  I called ER Triage RN.

## 2022-11-25 NOTE — ED Triage Notes (Signed)
Pt to ED for rectal bleeding x 1 week, generalized weakness, dizziness and fall yesterday. Sent by Dr Tasia Catchings, hgb has dropped from 10 to 7 in a couple days.

## 2022-11-25 NOTE — Assessment & Plan Note (Signed)
Last lipid panel was one month ago. HDL 46, LDL 83.  Plan Continue home regimen

## 2022-11-26 DIAGNOSIS — K921 Melena: Secondary | ICD-10-CM | POA: Diagnosis not present

## 2022-11-26 DIAGNOSIS — N1832 Chronic kidney disease, stage 3b: Secondary | ICD-10-CM | POA: Insufficient documentation

## 2022-11-26 LAB — GLUCOSE, CAPILLARY
Glucose-Capillary: 101 mg/dL — ABNORMAL HIGH (ref 70–99)
Glucose-Capillary: 167 mg/dL — ABNORMAL HIGH (ref 70–99)
Glucose-Capillary: 161 mg/dL — ABNORMAL HIGH (ref 70–99)
Glucose-Capillary: 205 mg/dL — ABNORMAL HIGH (ref 70–99)

## 2022-11-26 LAB — HEMOGLOBIN AND HEMATOCRIT, BLOOD
HCT: 29.8 % — ABNORMAL LOW (ref 36.0–46.0)
Hemoglobin: 9.6 g/dL — ABNORMAL LOW (ref 12.0–15.0)

## 2022-11-26 MED ORDER — SODIUM CHLORIDE 0.9 % IV SOLN
200.0000 mg | INTRAVENOUS | Status: DC
  Administered 2022-11-26 – 2022-11-27 (×2): 200 mg via INTRAVENOUS
  Filled 2022-11-26 (×2): qty 200

## 2022-11-26 NOTE — Progress Notes (Signed)
Inpatient Follow-up/Progress Note   Patient ID: Jasmine Webster is a 89 y.o. female.  Overnight Events / Subjective Findings NAEON. Pt transfused one unit with appropriate improvement to 9.3. Has remained hemodynamically stable. BM dark. B12 down to 480 (from >2400 in August of last year). Ferritin 74; iron sat 21 (was off of supplementation at time of these labs)  Review of Systems  Constitutional:  Positive for fatigue. Negative for activity change, appetite change, chills, diaphoresis, fever and unexpected weight change.  HENT:  Negative for trouble swallowing and voice change.   Respiratory:  Negative for shortness of breath and wheezing.   Cardiovascular:  Negative for chest pain, palpitations and leg swelling.  Gastrointestinal:  Positive for blood in stool (melena). Negative for abdominal distention, abdominal pain, anal bleeding, constipation, diarrhea, nausea, rectal pain and vomiting.  Musculoskeletal:  Negative for arthralgias and myalgias.  Skin:  Negative for color change and pallor.  Neurological:  Positive for weakness and light-headedness. Negative for dizziness and syncope.  Psychiatric/Behavioral:  Negative for confusion.   All other systems reviewed and are negative.    Medications  Current Facility-Administered Medications:    0.9 %  sodium chloride infusion, 10 mL/hr, Intravenous, Once, Rada Hay, MD, Held at 11/25/22 1647   0.9 %  sodium chloride infusion, , Intravenous, Continuous, Norins, Heinz Knuckles, MD, Last Rate: 50 mL/hr at 11/26/22 0400, Infusion Verify at 11/26/22 0400   acetaminophen (TYLENOL) tablet 650 mg, 650 mg, Oral, Q6H PRN **OR** acetaminophen (TYLENOL) suppository 650 mg, 650 mg, Rectal, Q6H PRN, Norins, Heinz Knuckles, MD   ALPRAZolam Duanne Moron) tablet 0.5 mg, 0.5 mg, Oral, QHS PRN, Norins, Heinz Knuckles, MD   ezetimibe (ZETIA) tablet 10 mg, 10 mg, Oral, Daily, Norins, Heinz Knuckles, MD, 10 mg at 11/26/22 0856   furosemide (LASIX) tablet 20 mg, 20 mg,  Oral, QODAY, Norins, Heinz Knuckles, MD, 20 mg at 11/25/22 1811   insulin aspart (novoLOG) injection 0-9 Units, 0-9 Units, Subcutaneous, TID WC, Norins, Heinz Knuckles, MD   insulin glargine-yfgn (SEMGLEE) injection 18 Units, 18 Units, Subcutaneous, Q2200, Norins, Heinz Knuckles, MD   lisinopril (ZESTRIL) tablet 10 mg, 10 mg, Oral, Daily, Norins, Heinz Knuckles, MD, 10 mg at 11/26/22 0856   magnesium oxide (MAG-OX) tablet 400 mg, 400 mg, Oral, Daily, Norins, Heinz Knuckles, MD, 400 mg at 11/26/22 0856   nitroGLYCERIN (NITROSTAT) SL tablet 0.4 mg, 0.4 mg, Sublingual, Q5 min PRN, Norins, Heinz Knuckles, MD   [START ON 11/29/2022] pantoprazole (PROTONIX) injection 40 mg, 40 mg, Intravenous, Q12H, McHugh, Jennette Dubin, MD   pantoprozole (PROTONIX) 80 mg /NS 100 mL infusion, 8 mg/hr, Intravenous, Continuous, Rada Hay, MD, Last Rate: 10 mL/hr at 11/26/22 0400, 8 mg/hr at 11/26/22 0400   senna (SENOKOT) tablet 8.6 mg, 1 tablet, Oral, BID, Norins, Heinz Knuckles, MD, 8.6 mg at 11/26/22 0856   zolpidem (AMBIEN) tablet 5 mg, 5 mg, Oral, QHS PRN, Norins, Heinz Knuckles, MD  sodium chloride Stopped (11/25/22 1647)   sodium chloride 50 mL/hr at 11/26/22 0400   pantoprazole 8 mg/hr (11/26/22 0400)    acetaminophen **OR** acetaminophen, ALPRAZolam, nitroGLYCERIN, zolpidem   Objective    Vitals:   11/25/22 2000 11/25/22 2212 11/26/22 0321 11/26/22 0748  BP: (!) 138/48 (!) 164/44 (!) 157/70 (!) 132/45  Pulse: 73 80 75 70  Resp: '15 16 16 18  '$ Temp:  98.1 F (36.7 C) 97.8 F (36.6 C) 97.7 F (36.5 C)  TempSrc:  Oral Oral   SpO2: 100% 100% 100%  97%  Weight:  67 kg    Height:  '5\' 4"'$  (1.626 m)       Physical Exam Vitals and nursing note reviewed.  Constitutional:      General: She is not in acute distress.    Appearance: She is not toxic-appearing or diaphoretic.     Comments: Frail appearing  HENT:     Head: Normocephalic and atraumatic.     Nose: Nose normal.     Mouth/Throat:     Mouth: Mucous membranes are moist.      Pharynx: Oropharynx is clear.  Eyes:     General: No scleral icterus.    Extraocular Movements: Extraocular movements intact.  Cardiovascular:     Rate and Rhythm: Normal rate and regular rhythm.     Heart sounds: Murmur heard.     No friction rub. No gallop.  Pulmonary:     Effort: Pulmonary effort is normal. No respiratory distress.     Breath sounds: Normal breath sounds. No wheezing, rhonchi or rales.  Abdominal:     General: Abdomen is flat. Bowel sounds are normal. There is no distension.     Palpations: Abdomen is soft.     Tenderness: There is no abdominal tenderness. There is no guarding or rebound.  Musculoskeletal:     Cervical back: Neck supple.     Right lower leg: No edema.     Left lower leg: No edema.  Skin:    General: Skin is warm and dry.     Coloration: Skin is pale. Skin is not jaundiced.  Neurological:     General: No focal deficit present.     Mental Status: She is alert and oriented to person, place, and time. Mental status is at baseline.  Psychiatric:        Mood and Affect: Mood normal.        Behavior: Behavior normal.        Thought Content: Thought content normal.        Judgment: Judgment normal.      Laboratory Data Recent Labs  Lab 11/20/22 1120 11/25/22 1056 11/25/22 1206 11/25/22 2245  WBC 5.5 5.9 10.2  --   HGB 10.4* 7.9* 8.5* 9.3*  HCT 32.8* 24.7* 27.7* 28.6*  PLT 203 219 PLATELET CLUMPS NOTED ON SMEAR, UNABLE TO ESTIMATE  --   NEUTOPHILPCT  --  52  --   --   LYMPHOPCT  --  33  --   --   MONOPCT  --  10  --   --   EOSPCT  --  3  --   --    Recent Labs  Lab 11/20/22 1120 11/25/22 1206  NA 134* 135  K 5.1 4.3  CL 103 104  CO2 25 21*  BUN 36* 32*  CREATININE 1.09* 1.18*  CALCIUM 8.4* 8.5*  PROT 6.1* 5.7*  BILITOT 0.9 0.8  ALKPHOS 96 77  ALT 15 13  AST 20 21  GLUCOSE 178* 235*   No results for input(s): "INR" in the last 168 hours.    Imaging Studies: No results found.  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 - pt has noted melena - bun/cr ratio elevated - no current nsaid use - hgb 8.5 --> 9.3 with 1 u prbc transfusion on 11/25/22 - ddx does include but not limited to gastritis, duodenitis, malignancy, but this would not change management and  was d/w pt and her daughter   # macrocytic and acute blood loss anemia # generalized weakness # Severe Aortic Stenosis # CKD4 # HFpEF - grade 2 diastolic dysfunction    Plan:  Hgb improved with 1 u prbc transfusion Recommend iron and b12 supplementation Provide with ascorbic acid supplementation Continue protonix 40 mg daily Advance diet as tolerated Hold dvt ppx Supportive care and antiemetics as per primary team Maintain two sites IV access Avoid nsaids Monitor for GIB.  I discussed the risks and benefits of performing EGD with the patient and her daughter on 11/25/22 and they have also previously been discussed with the patient As discussed in the past given her significant aortic stenosis, the patient is high risk for sedation and anesthesia has recommended patient have this performed at a tertiary facility. Pt was evaluated for tavr previously and felt not to be a canidate This time I do not think she needs to be transferred as her vital signs are stable. Would continue to Monitor H&H.  Transfusion and resuscitation as per primary team. Goal hgb >8 Avoid frequent lab draws to prevent lab induced anemia  Pt and daughter in agreeance with plan.  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  Even if 1 area were treated there may be multiple other areas or likely recurrence and patient may be transfusion dependent regardless   Should patient become hemodynamically unstable would recommend stat CTA to evaluate for source localization and possible embolization if indicated  GI  to sign off at this time, but will be available as needed  I personally performed the service.  Management of other medical comorbidities as per primary team  Thank you for allowing [name] to participate in this patient's care. Please don't hesitate to call if any questions or concerns arise.   Annamaria Helling, DO [facility]  Portions of the record may have been created with voice recognition software. Occasional wrong-word or 'sound-a-like' substitutions may have occurred due to the inherent limitations of voice recognition software.  Read the chart carefully and recognize, using context, where substitutions may have occurred.

## 2022-11-26 NOTE — Progress Notes (Addendum)
PROGRESS NOTE    Jasmine Webster  QHU:[id number] DOB: 1926/07/10 DOA: 11/25/2022 PCP: Eulis Foster, MD  Outpatient Specialists: cardiology, oncology, GI    Brief Narrative:   From admission h and p  Jasmine Webster, a 89 y/o widow with a h/o several episodes of melena and blood loss anemia reports that she has had melena for the week prior to admission with progressive weakness. Today her daughter found her on the floor to weak to get up. Other medical problems include HTN, HLD, hypothyroidism, severe aortic stenosis, DM, CAD. She was brought to [facility] for evaluation of weakness and melena. Patient does admit to having had an episode of chest pain with exertion the day prior to admission.    Assessment & Plan:   Principal Problem:   Melena Active Problems:   Severe aortic stenosis   Diabetes mellitus without complication (HCC)   Benign essential HTN   Hypercholesteremia   Adult hypothyroidism   Coronary artery disease   Pulmonary hypertension (HCC)   Stage 3b chronic kidney disease (CKD) ([place])   # Melena # Acute on chronic blood loss anemia Intermittent melena for over a year. Recent baseline hgb 10-22. High risk for endoscopy complications, that has been deferred thus far. Normal capsule study a few months ago. Symptomatic and with a hgb in the 7s on arrival, transfused one unit and feeling much better, hgb 9s. Family not comfortable with discharge today, reasonable to monitor overnight given ongoing melena - GI consulted, recs pending, likely cons mgmt for now - ppi IV for now - will give a dose of IV iron - trend hgb  # Debility - PT consult  # Aortic stenosis, severe Not a surgical candidate per cardiology. Asymptomatic - monitor  # HTN Here bp appropriate - cont home lsiinopril, lasix  # CAD Asymptomatic - cont home zetia - hold aspirin  # T2DM Here glucose appropriate - cont home semglee - SSI  # GAD - home xanax prn  # CKD 3b At baseline -  monitor  DVT prophylaxis: SCDs Code Status: DNR Family Communication: daughter updated @ bedside 1/9  Level of care: Med-Surg Status is: Inpatient Remains inpatient appropriate because: severity of illness    Consultants:  GI  Procedures: none  Antimicrobials:  none    Subjective: Feeling well, no pain, fatigue improved  Objective: Vitals:   11/25/22 2000 11/25/22 2212 11/26/22 0321 11/26/22 0748  BP: (!) 138/48 (!) 164/44 (!) 157/70 (!) 132/45  Pulse: 73 80 75 70  Resp: '15 16 16 18  '$ Temp:  98.1 F (36.7 C) 97.8 F (36.6 C) 97.7 F (36.5 C)  TempSrc:  Oral Oral   SpO2: 100% 100% 100% 97%  Weight:  67 kg    Height:  '5\' 4"'$  (1.626 m)      Intake/Output Summary (Last 24 hours) at 11/26/2022 1101 Last data filed at 11/26/2022 0400 Gross per 24 hour  Intake 465.48 ml  Output 250 ml  Net 215.48 ml   Filed Weights   11/25/22 1155 11/25/22 2212  Weight: 65.3 kg 67 kg    Examination:  General exam: Appears calm and comfortable  Respiratory system: Clear to auscultation. Respiratory effort normal. Cardiovascular system: S1 & S2 heard, RRR. Harsh holosystolic murmur Gastrointestinal system: Abdomen is nondistended, soft and nontender. No organomegaly or masses felt. Normal bowel sounds heard. Central nervous system: Alert and oriented. No focal neurological deficits. Extremities: Symmetric 5 x 5 power. Skin: No rashes, lesions or ulcers Psychiatry: Judgement and insight  appear normal. Mood & affect appropriate.     Data Reviewed: I have personally reviewed following labs and imaging studies  CBC: Recent Labs  Lab 11/20/22 1120 11/25/22 1056 11/25/22 1206 11/25/22 2245 11/26/22 0955  WBC 5.5 5.9 10.2  --   --   NEUTROABS  --  3.1  --   --   --   HGB 10.4* 7.9* 8.5* 9.3* 9.6*  HCT 32.8* 24.7* 27.7* 28.6* 29.8*  MCV 97.9 97.6 101.5*  --   --   PLT 203 219 PLATELET CLUMPS NOTED ON SMEAR, UNABLE TO ESTIMATE  --   --    Basic Metabolic Panel: Recent Labs   Lab 11/20/22 1120 11/25/22 1206  NA 134* 135  K 5.1 4.3  CL 103 104  CO2 25 21*  GLUCOSE 178* 235*  BUN 36* 32*  CREATININE 1.09* 1.18*  CALCIUM 8.4* 8.5*   GFR: Estimated Creatinine Clearance: 27.4 mL/min (A) (by C-G formula based on SCr of 1.18 mg/dL (H)). Liver Function Tests: Recent Labs  Lab 11/20/22 1120 11/25/22 1206  AST 20 21  ALT 15 13  ALKPHOS 96 77  BILITOT 0.9 0.8  PROT 6.1* 5.7*  ALBUMIN 3.7 3.5   No results for input(s): "LIPASE", "AMYLASE" in the last 168 hours. No results for input(s): "AMMONIA" in the last 168 hours. Coagulation Profile: No results for input(s): "INR", "PROTIME" in the last 168 hours. Cardiac Enzymes: No results for input(s): "CKTOTAL", "CKMB", "CKMBINDEX", "TROPONINI" in the last 168 hours. BNP (last 3 results) No results for input(s): "PROBNP" in the last 8760 hours. HbA1C: No results for input(s): "HGBA1C" in the last 72 hours. CBG: Recent Labs  Lab 11/25/22 1803 11/25/22 2223 11/26/22 0751  GLUCAP 122* 180* 101*   Lipid Profile: No results for input(s): "CHOL", "HDL", "LDLCALC", "TRIG", "CHOLHDL", "LDLDIRECT" in the last 72 hours. Thyroid Function Tests: No results for input(s): "TSH", "T4TOTAL", "FREET4", "T3FREE", "THYROIDAB" in the last 72 hours. Anemia Panel: Recent Labs    11/25/22 1056  VITAMINB12 480  FOLATE 34.0  RETICCTPCT 3.3*   Urine analysis:    Component Value Date/Time   COLORURINE YELLOW (A) 06/20/2022 1212   APPEARANCEUR CLEAR (A) 06/20/2022 1212   LABSPEC 1.010 06/20/2022 1212   PHURINE 5.0 06/20/2022 1212   GLUCOSEU NEGATIVE 06/20/2022 1212   HGBUR NEGATIVE 06/20/2022 1212   BILIRUBINUR NEGATIVE 06/20/2022 1212   BILIRUBINUR negative 05/26/2019 1558   KETONESUR NEGATIVE 06/20/2022 1212   PROTEINUR NEGATIVE 06/20/2022 1212   UROBILINOGEN 0.2 05/26/2019 1558   UROBILINOGEN 1.0 05/25/2009 0717   NITRITE NEGATIVE 06/20/2022 1212   LEUKOCYTESUR NEGATIVE 06/20/2022 1212   Sepsis  Labs: '@LABRCNTIP'$ (procalcitonin:4,lacticidven:4)  )No results found for this or any previous visit (from the past 240 hour(s)).       Radiology Studies: No results found.      Scheduled Meds:  ezetimibe  10 mg Oral Daily   furosemide  20 mg Oral QODAY   insulin aspart  0-9 Units Subcutaneous TID WC   insulin glargine-yfgn  18 Units Subcutaneous Q2200   lisinopril  10 mg Oral Daily   magnesium oxide  400 mg Oral Daily   [START ON 11/29/2022] pantoprazole  40 mg Intravenous Q12H   senna  1 tablet Oral BID   Continuous Infusions:  sodium chloride Stopped (11/25/22 1647)   sodium chloride 50 mL/hr at 11/26/22 0400   pantoprazole 8 mg/hr (11/26/22 0400)     LOS: 1 day     Desma Maxim, MD [facility]  If 7PM-7AM, please contact night-coverage [email or web address] Password [email or web address] 11/26/2022, 11:01 AM

## 2022-11-26 NOTE — TOC Initial Note (Signed)
Transition of Care [facility]) - Initial/Assessment Note    Patient Details  Name: Jasmine Webster MRN: [id number] Date of Birth: Aug 21, 1926  Transition [facility]) CM/SW Contact:    Beverly Sessions, RN Phone Number: 11/26/2022, 12:31 PM  Clinical Narrative:                  Met with patient and granddaughter at bedside  Admitted [id number] Admitted from: home with family CVK:[id number]- [place] [place].  Family transports to appointments, and will transport at discharge  Current home health/prior home health/DME: RW, cane, WC  PT eval pending.  Patient states that is home health is recommended she would be agreeable and states she does not have a preference of agency. Patient states that if SNF is recommended she is not interested.       Patient Goals and CMS Choice            Expected Discharge Plan and Services                                              Prior Living Arrangements/Services                       Activities of Daily Living Home Assistive Devices/Equipment: Gilford Rile (specify type) ADL Screening (condition at time of admission) Patient's cognitive ability adequate to safely complete daily activities?: Yes Is the patient deaf or have difficulty hearing?: No Does the patient have difficulty seeing, even when wearing glasses/contacts?: No Does the patient have difficulty concentrating, remembering, or making decisions?: No Patient able to express need for assistance with ADLs?: Yes Does the patient have difficulty dressing or bathing?: No Independently performs ADLs?: Yes (appropriate for developmental age) Does the patient have difficulty walking or climbing stairs?: No Weakness of Legs: None Weakness of Arms/Hands: None  Permission Sought/Granted                  Emotional Assessment              Admission diagnosis:  Melena [K92.1] Symptomatic anemia [D64.9] Gastrointestinal hemorrhage, unspecified gastrointestinal hemorrhage  type [K92.2] Patient Active Problem List   Diagnosis Date Noted   Stage 3b chronic kidney disease (CKD) ([place]) 11/26/2022   Melena 11/25/2022   Normocytic anemia 06/26/2022   Iron deficiency anemia 06/26/2022   Anemia in chronic kidney disease (CKD) 06/26/2022   Symptomatic anemia 05/22/2022   Severe aortic stenosis    Acute CHF (congestive heart failure) ([place]) 05/21/2022   NSTEMI (non-ST elevated myocardial infarction) ([place]) 05/21/2022   GERD without esophagitis 05/21/2022   Type 2 diabetes mellitus without complications ([place]) 43/60/6770   Dyslipidemia 05/21/2022   Aortic valve stenosis    Pulmonary hypertension ([place])    Type 2 diabetes mellitus with diabetic chronic kidney disease ([place]) 02/20/2022   Heart failure, unspecified ([place]) 02/20/2022   Chronic kidney disease, stage 4 (severe) ([place]) 02/20/2022   Anemia in chronic kidney disease 02/20/2022   Weakness    Hyponatremia 11/26/2021   Hypertension    Coronary artery disease    COVID-19 virus infection 12/2020   Diabetes mellitus without complication ([place]) 34/01/5247   L-S radiculopathy 01/15/2019   Angina pectoris ([place]) 12/28/2018   Mixed hyperlipidemia 12/28/2018   Arthritis 03/23/2015   Cardiovascular disease 03/23/2015   Urinary system disease 03/23/2015   Malignant neoplasm of corpus uteri ([place])  03/23/2015   Benign essential HTN 03/23/2015   Acid reflux 03/23/2015   Cardiac murmur 03/23/2015   Hypercholesteremia 03/23/2015   Adult hypothyroidism 03/23/2015   Cannot sleep 03/23/2015   Malaise and fatigue 03/23/2015   Muscle ache 03/23/2015   Arthritis, degenerative 03/23/2015   PCP:  Eulis Foster, MD Pharmacy:   [name] [place] [id number]: [phone]: [phone]    Social Determinants of Health (SDOH) Social History: [place] No Food Insecurity (11/25/2022)  Housing: Low Risk  (11/25/2022)   Transportation Needs: No Transportation Needs (11/25/2022)  Utilities: Not At Risk (11/25/2022)  Alcohol Screen: Low Risk  (09/11/2022)  Depression (PHQ2-9): Low Risk  (09/11/2022)  Financial Resource Strain: Low Risk  (09/11/2022)  Physical Activity: Insufficiently Active (09/11/2022)  Social Connections: Moderately Isolated (09/11/2022)  Stress: No Stress Concern Present (09/11/2022)  Tobacco Use: Low Risk  (11/25/2022)   SDOH Interventions:     Readmission Risk Interventions    11/26/2022   12:30 PM 11/27/2021    2:49 PM  Readmission Risk Prevention Plan  Transportation Screening Complete Complete  HRI or Home Care Consult Complete Patient refused  Social Work Consult for [place]/Counseling Complete Complete  Palliative Care Screening Not Applicable Not Applicable  Medication Review (Press photographer Complete Complete

## 2022-11-26 NOTE — Evaluation (Signed)
Physical Therapy Evaluation Patient Details Name: Jasmine Webster MRN: [id number] DOB: November 10, 1926 Today's Date: 11/26/2022  History of Present Illness  presented to ER secondary to melena, progressive weakness and fall in home environment; admitted for management of blood loss anemia, s/p transfusion  Clinical Impression  Patient resting in bed upon arrival to room; granddaughter at bedside, supportive and encouraging.  Patient alert and oriented, follows commands and agreeable to participation with session.  Denies pain; does endorse generally feeling better/stronger since admission.  Bilat UE/LE strength and ROM grossly symmetrical and WFL; no focal weakness appreciated.  Able to complete bed mobility with mod indep; sit/stand, basic transfers and gait (200') with RW, close sup.  Demonstrates reciprocal stepping pattern with fair step height/length; slow, but steady, cadence without buckling or LOB. did require single standing rest break to complete distance due to generalized fatigue  Did review importance and role of activity pacing, energy conservation; patient/granddaughter voiced understanding. Patient appears to be near baseline level of functional ability; no skilled PT needs identified at this time.  Will complete PT orders at this time, but will enroll with mobility team to ensure progressive mobility efforts throughout remaining hospitalization.     Recommendations for follow up therapy are one component of a multi-disciplinary discharge planning process, led by the attending physician.  Recommendations may be updated based on patient status, additional functional criteria and insurance authorization.  Follow Up Recommendations No PT follow up (will enroll with mobility team to ensure progressive mobility efforts throughout remaining hospital stay)      Assistance Recommended at Discharge PRN  Patient can return home with the following       Equipment Recommendations  (has necessary  equipment)  Recommendations for Other Services       Functional Status Assessment Patient has not had a recent decline in their functional status     Precautions / Restrictions Precautions Precautions: Fall Restrictions Weight Bearing Restrictions: No      Mobility  Bed Mobility Overal bed mobility: Needs Assistance, Modified Independent                  Transfers Overall transfer level: Needs assistance Equipment used: Rolling walker (2 wheels) Transfers: Sit to/from Stand Sit to Stand: Supervision           General transfer comment: cuing for hand placement to prevent pulling on RW    Ambulation/Gait Ambulation/Gait assistance: Min guard, Supervision Gait Distance (Feet): 200 Feet Assistive device: Rolling walker (2 wheels)   Gait velocity: 10' walk time, 8-9 seconds     General Gait Details: reciprocal stepping pattern with fair step height/length; slow, but steady, cadence without buckling or LOB.  did require single standing rest break to complete distance due to generalized fatigue  Stairs            Wheelchair Mobility    Modified Rankin (Stroke Patients Only)       Balance Overall balance assessment: Modified Independent                                           Pertinent Vitals/Pain Pain Assessment Pain Assessment: No/denies pain    Home Living Family/patient expects to be discharged to:: Private residence Living Arrangements: Children;Other relatives Available Help at Discharge: Family;Available 24 hours/day Type of Home: House (lives in 'in law suite' of daughter's home)  Alternate Level Stairs-Number of Steps: flight; has stair lift (steps not required) Home Layout: Two level Home Equipment: Rollator (4 wheels);Cane - single point;Shower seat      Prior Function Prior Level of Function : Independent/Modified Independent             Mobility Comments: 4WRW for household mobility, SPC for  community mobility; denies fall history outside of this admission ADLs Comments: Indep with ADLS in home environment; assist for transportation, household chores and iADLs from family as needed     Hand Dominance        Extremity/Trunk Assessment   Upper Extremity Assessment Upper Extremity Assessment: Overall WFL for tasks assessed    Lower Extremity Assessment Lower Extremity Assessment: Overall WFL for tasks assessed (grossly 4-/5 throughout; no focal weakness appreciated)       Communication   Communication: No difficulties  Cognition Arousal/Alertness: Awake/alert Behavior During Therapy: WFL for tasks assessed/performed Overall Cognitive Status: Within Functional Limits for tasks assessed                                          General Comments      Exercises     Assessment/Plan    PT Assessment Patient does not need any further PT services  PT Problem List         PT Treatment Interventions      PT Goals (Current goals can be found in the Care Plan section)  Acute Rehab PT Goals Patient Stated Goal: to return home PT Goal Formulation: All assessment and education complete, DC therapy Time For Goal Achievement: 11/26/22 Potential to Achieve Goals: Good    Frequency       Co-evaluation               AM-PAC PT "6 Clicks" Mobility  Outcome Measure Help needed turning from your back to your side while in a flat bed without using bedrails?: None Help needed moving from lying on your back to sitting on the side of a flat bed without using bedrails?: None Help needed moving to and from a bed to a chair (including a wheelchair)?: None Help needed standing up from a chair using your arms (e.g., wheelchair or bedside chair)?: None Help needed to walk in hospital room?: None Help needed climbing 3-5 steps with a railing? : A Little 6 Click Score: 23    End of Session Equipment Utilized During Treatment: Gait belt Activity  Tolerance: Patient tolerated treatment well Patient left: in bed;with family/visitor present;with call bell/phone within reach Nurse Communication: Mobility status PT Visit Diagnosis: Muscle weakness (generalized) (M62.81)    Time: 1220-1240 PT Time Calculation (min) (ACUTE ONLY): 20 min   Charges:   PT Evaluation $PT Eval Low Complexity: 1 Low          Stuti Sandin H. Owens Shark, PT, DPT, NCS 11/26/22, 1:44 PM [phone]

## 2022-11-27 ENCOUNTER — Other Ambulatory Visit: Payer: Self-pay

## 2022-11-27 ENCOUNTER — Telehealth: Payer: Self-pay

## 2022-11-27 DIAGNOSIS — D500 Iron deficiency anemia secondary to blood loss (chronic): Secondary | ICD-10-CM

## 2022-11-27 DIAGNOSIS — K921 Melena: Secondary | ICD-10-CM | POA: Diagnosis not present

## 2022-11-27 LAB — BASIC METABOLIC PANEL
Anion gap: 5 (ref 5–15)
BUN: 18 mg/dL (ref 8–23)
CO2: 25 mmol/L (ref 22–32)
Calcium: 8.2 mg/dL — ABNORMAL LOW (ref 8.9–10.3)
Chloride: 107 mmol/L (ref 98–111)
Creatinine, Ser: 0.94 mg/dL (ref 0.44–1.00)
GFR, Estimated: 56 mL/min — ABNORMAL LOW (ref >60)
Glucose, Bld: 111 mg/dL — ABNORMAL HIGH (ref 70–99)
Potassium: 3.8 mmol/L (ref 3.5–5.1)
Sodium: 137 mmol/L (ref 135–145)

## 2022-11-27 LAB — CBC
HCT: 26.8 % — ABNORMAL LOW (ref 36.0–46.0)
Hemoglobin: 8.6 g/dL — ABNORMAL LOW (ref 12.0–15.0)
MCH: 30.4 pg (ref 26.0–34.0)
MCHC: 32.1 g/dL (ref 30.0–36.0)
MCV: 94.7 fL (ref 80.0–100.0)
Platelets: UNDETERMINED 10*3/uL (ref 150–400)
RBC: 2.83 MIL/uL — ABNORMAL LOW (ref 3.87–5.11)
RDW: 15.2 % (ref 11.5–15.5)
WBC: 5.6 10*3/uL (ref 4.0–10.5)
nRBC: 0 % (ref 0.0–0.2)

## 2022-11-27 LAB — TYPE AND SCREEN
ABO/RH(D): O POS
Antibody Screen: NEGATIVE
Unit division: 0
Unit division: 0

## 2022-11-27 LAB — GLUCOSE, CAPILLARY
Glucose-Capillary: 114 mg/dL — ABNORMAL HIGH (ref 70–99)
Glucose-Capillary: 175 mg/dL — ABNORMAL HIGH (ref 70–99)

## 2022-11-27 LAB — BPAM RBC
Blood Product Expiration Date: 202402132359
Blood Product Expiration Date: 202402132359
ISSUE DATE / TIME: 202401081627
Unit Type and Rh: 5100
Unit Type and Rh: 5100

## 2022-11-27 LAB — PREPARE RBC (CROSSMATCH)

## 2022-11-27 MED ORDER — ASCORBIC ACID 250 MG PO TABS: 250.0000 mg | ORAL_TABLET | Freq: Every day | ORAL | 1 refills | Status: DC

## 2022-11-27 MED ORDER — POLYSACCHARIDE IRON COMPLEX 150 MG PO CAPS: 150.0000 mg | ORAL_CAPSULE | Freq: Every day | ORAL | Status: DC

## 2022-11-27 MED ORDER — CYANOCOBALAMIN 500 MCG PO TABS: 500.0000 ug | ORAL_TABLET | ORAL | 1 refills | Status: DC

## 2022-11-27 MED ORDER — VITAMIN C 500 MG PO TABS
250.0000 mg | ORAL_TABLET | Freq: Every day | ORAL | Status: DC
  Administered 2022-11-27: 250 mg via ORAL
  Filled 2022-11-27: qty 1

## 2022-11-27 MED ORDER — POLYSACCHARIDE IRON COMPLEX 150 MG PO CAPS: 150.0000 mg | ORAL_CAPSULE | Freq: Every day | ORAL | 1 refills | Status: DC

## 2022-11-27 MED ORDER — VITAMIN B-12 1000 MCG PO TABS
1000.0000 ug | ORAL_TABLET | Freq: Every day | ORAL | Status: DC
  Administered 2022-11-27: 1000 ug via ORAL
  Filled 2022-11-27: qty 1

## 2022-11-27 MED ORDER — PANTOPRAZOLE SODIUM 40 MG PO TBEC
40.0000 mg | DELAYED_RELEASE_TABLET | Freq: Two times a day (BID) | ORAL | Status: DC
  Administered 2022-11-27: 40 mg via ORAL
  Filled 2022-11-27: qty 1

## 2022-11-27 NOTE — Progress Notes (Signed)
Patient's daughter Katherina Right contacted day RN to follow up regarding medication prescribed at discharge. Pantoprazole was prescribed for patient to continue home use. Per daughter patient was taking omeprazole and not the Pantoprazole prior to this admission. Per Dr. Posey Pronto inform patient that to continue omeprazole twice a day until the Pantoprazole can be called into patient's pharmacy. Daughter updated via phone.   Fuller Mandril, RN

## 2022-11-27 NOTE — Plan of Care (Signed)

## 2022-11-27 NOTE — Progress Notes (Signed)
Jasmine Webster to be discharged Home per MD order. Discussed prescriptions and follow up appointments with the patient. Prescriptions given to patient, medication list explained in detail. Patient verbalized understanding.  Allergies as of 11/27/2022       Reactions   Etodolac Nausea Only, Other (See Comments), Anxiety   Dizziness, too   Naproxen Other (See Comments), Anxiety   Gave her an ulcer after taking it for years   Statins Other (See Comments)   No energy and myalgias   Latex Rash   Terbinafine Other (See Comments), Rash   "Maybe made me nervous"   Terbinafine Hcl Rash        Medication List     STOP taking these medications    aspirin EC 81 MG tablet   omeprazole 40 MG capsule Commonly known as: PRILOSEC   tobramycin 0.3 % ophthalmic solution Commonly known as: TOBREX       TAKE these medications    ALPRAZolam 0.5 MG tablet Commonly known as: XANAX TAKE 1 TABLET BY MOUTH EVERY DAY AT BEDTIME AS NEEDED   ascorbic acid 250 MG tablet Commonly known as: VITAMIN C Take 1 tablet (250 mg total) by mouth daily.   B-D ULTRAFINE III SHORT PEN 31G X 8 MM Misc Generic drug: Insulin Pen Needle Use for administering glucose twice daily   co-enzyme Q-10 30 MG capsule Take 30 mg by mouth daily.   cyanocobalamin 500 MCG tablet Commonly known as: VITAMIN B12 Take 1 tablet (500 mcg total) by mouth once a week.   DRY EYES OP Apply 1 drop to eye daily.   ezetimibe 10 MG tablet Commonly known as: ZETIA TAKE 1 TABLET BY MOUTH EVERY DAY   FISH OIL PO Take by mouth daily.   furosemide 20 MG tablet Commonly known as: LASIX Take 1 tablet (20 mg total) by mouth every other day.   insulin glargine-yfgn 100 UNIT/ML Pen Commonly known as: Semglee (yfgn) INJECT 18 UNITS SUBCUTANEOUSLY   iron polysaccharides 150 MG capsule Commonly known as: NIFEREX Take 1 capsule (150 mg total) by mouth daily. Start taking on: November 28, 2022   lisinopril 10 MG  tablet Commonly known as: ZESTRIL TAKE 1 TABLET BY MOUTH EVERY DAY   Magnesium 400 MG Caps Take 400 mg by mouth daily.   nitroGLYCERIN 0.4 MG SL tablet Commonly known as: NITROSTAT Place 1 tablet (0.4 mg total) under the tongue every 5 (five) minutes as needed for chest pain.   OneTouch Ultra test strip Generic drug: glucose blood CHECK SUGAR TWICE A DAY   pantoprazole 40 MG tablet Commonly known as: PROTONIX Take 1 tablet (40 mg total) by mouth 2 (two) times daily before a meal.   PreserVision AREDS Caps Take 1 capsule by mouth 2 (two) times daily.   Turmeric 500 MG Caps Take by mouth daily.        Vitals:   11/27/22 0355 11/27/22 0737  BP: (!) 133/45 (!) 132/47  Pulse: 72 70  Resp: 16 18  Temp: 97.7 F (36.5 C) 98 F (36.7 C)  SpO2: 98% 98%    Skin clean, dry and intact without evidence of skin break down and or skin tears. IV catheter discontinued intact. Site without signs and symptoms of complications. Dressing and pressure applied. Patient denies pain at this time. No complaints noted.  An After Visit Summary was printed and given to the patient. Patient escorted via wheelchair and discharged Home home via private auto.  Fuller Mandril, RN

## 2022-11-27 NOTE — Telephone Encounter (Signed)
Called and spoke to patient, who states that they have decided to hold off on being referred to [place], because given her medical cardiac history, it would be too much on her. Pt states she would like to continue getting iron infusion and blood transfusions when need and keep her doctors here

## 2022-11-27 NOTE — TOC Transition Note (Signed)
Transition of Care [facility]) - CM/SW Discharge Note   Patient Details  Name: BRIONNE MERTZ MRN: [id number] Date of Birth: 1926/09/01  Transition of Care [facility]) CM/SW Contact:  Beverly Sessions, RN Phone Number: 11/27/2022, 11:54 AM   Clinical Narrative:    Patient to discharge today PT says no PT follow up and has all required DME Discharge discussed with MD and no TOC needs for discharge          Patient Goals and CMS Choice      Discharge Placement                         Discharge Plan and Services Additional resources added to the After Visit Summary for                                       Social Determinants of Health (SDOH) Interventions SDOH Screenings   Food Insecurity: No Food Insecurity (11/25/2022)  Housing: Low Risk  (11/25/2022)  Transportation Needs: No Transportation Needs (11/25/2022)  Utilities: Not At Risk (11/25/2022)  Alcohol Screen: Low Risk  (09/11/2022)  Depression (PHQ2-9): Low Risk  (09/11/2022)  Financial Resource Strain: Low Risk  (09/11/2022)  Physical Activity: Insufficiently Active (09/11/2022)  Social Connections: Moderately Isolated (09/11/2022)  Stress: No Stress Concern Present (09/11/2022)  Tobacco Use: Low Risk  (11/25/2022)     Readmission Risk Interventions    11/26/2022   12:30 PM 11/27/2021    2:49 PM  Readmission Risk Prevention Plan  Transportation Screening Complete Complete  HRI or Home Care Consult Complete Patient refused  Social Work Consult for [place] Planning/Counseling Complete Complete  Palliative Care Screening Not Applicable Not Applicable  Medication Review Press photographer) Complete Complete

## 2022-11-27 NOTE — Discharge Summary (Signed)
Physician Discharge Summary   Patient: Jasmine Webster MRN: [id number] DOB: 03/31/1926  Admit date:     11/25/2022  Discharge date: 11/27/22  Discharge Physician: Jasmine Webster   PCP: Jasmine Foster, MD   Recommendations at discharge:   follow primary care in 1 to 2 weeks follow-up G.I. Dr. Haig Webster in a week to two weeks follow-up Dr. you at the cancer center on the appointment that will be called in for CBC check and possible IV ion infusion.  Discharge Diagnoses: Principal Problem:   Melena Active Problems:   Severe aortic stenosis   Diabetes mellitus without complication (HCC)   Benign essential HTN   Hypercholesteremia   Adult hypothyroidism   Coronary artery disease   Pulmonary hypertension (HCC)   Stage 3b chronic kidney disease (CKD) [facility])  Hospital Course: Jasmine Webster, a 89 y/o widow with a h/o several episodes of melena and blood loss anemia reports that she has had melena for the week prior to admission with progressive weakness. Today her daughter found her on the floor to weak to get up. Other medical problems include HTN, HLD, hypothyroidism, severe aortic stenosis, DM, CAD. She was brought to [facility] for evaluation of weakness and melena.   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 Pt is  at High risk for endoscopy complications, that has been deferred thus far. -- Normal capsule study a few months ago.  --Symptomatic and with a hgb in the 7s on arrival, transfused one unit and feeling much better, hgb 9s.  --hgb 8.6 --got 2 doses of IV iron  - GI consulted, recs pending, likely cons mgmt for now--and consider Jasmine Webster or Jasmine Webster referral as out pt - ppi IV for now --no active GI bleed --will start PO IM polysaccharide, vitamin B12 and PO vitamin C -- discussed with Jasmine Webster as well. Patient will follow in a week to get labs done  and possible IV and infusion as outpatient   # Debility - PT consult--no PT needs --overall feeling better   # Aortic stenosis, severe Not a surgical candidate [facility] cardiology. Asymptomatic -f/u with [facility]ology   # HTN Here bp appropriate - cont home lsiinopril, lasix   # CAD Asymptomatic - cont home zetia - hold aspirin at discharge for now--defer to GI or PCP to resume when able to   # T2DM Here glucose appropriate - cont home semglee - SSI   # GAD - home xanax prn   # CKD 3b At baseline  Patient overall feels better. She is setting out in the recliner chair. Discussed discharge plan, follow-up, PO meds including iron and vitamin B-12 with C with patient and friend Jasmine Webster would at bedside. Family friend will relay with above information to daughter. There okay with me not calling her daughter.   DVT prophylaxis: SCDs Code Status: DNR Family Communication: friend Jasmine Webster     Consultants: GI [facility] cardiology Disposition: Home Diet recommendation:  Discharge Diet Orders (From admission, onward)     Start     Ordered   11/27/22 0000  Diet - low sodium heart healthy        11/27/22 1136           Cardiac diet DISCHARGE MEDICATION: Allergies as of 11/27/2022       Reactions   Etodolac Nausea Only, Other (See Comments), Anxiety   Dizziness,  too   Naproxen Other (See Comments), Anxiety   Gave her an ulcer after taking it for years   Statins Other (See Comments)   No energy and myalgias   Latex Rash   Terbinafine Other (See Comments), Rash   "Maybe made me nervous"   Terbinafine Hcl Rash        Medication List     STOP taking these medications    aspirin EC 81 MG tablet   omeprazole 40 MG capsule Commonly known as: PRILOSEC   tobramycin 0.3 % ophthalmic solution Commonly known as: TOBREX       TAKE these medications    ALPRAZolam 0.5 MG tablet Commonly known as: XANAX TAKE 1 TABLET BY MOUTH EVERY DAY AT BEDTIME AS NEEDED    ascorbic acid 250 MG tablet Commonly known as: VITAMIN C Take 1 tablet (250 mg total) by mouth daily.   B-D ULTRAFINE III SHORT PEN 31G X 8 MM Misc Generic drug: Insulin Pen Needle Use for administering glucose twice daily   co-enzyme Q-10 30 MG capsule Take 30 mg by mouth daily.   cyanocobalamin 500 MCG tablet Commonly known as: VITAMIN B12 Take 1 tablet (500 mcg total) by mouth once a week.   DRY EYES OP Apply 1 drop to eye daily.   ezetimibe 10 MG tablet Commonly known as: ZETIA TAKE 1 TABLET BY MOUTH EVERY DAY   FISH OIL PO Take by mouth daily.   furosemide 20 MG tablet Commonly known as: LASIX Take 1 tablet (20 mg total) by mouth every other day.   insulin glargine-yfgn 100 UNIT/ML Pen Commonly known as: Semglee (yfgn) INJECT 18 UNITS SUBCUTANEOUSLY   iron polysaccharides 150 MG capsule Commonly known as: NIFEREX Take 1 capsule (150 mg total) by mouth daily. Start taking on: November 28, 2022   lisinopril 10 MG tablet Commonly known as: ZESTRIL TAKE 1 TABLET BY MOUTH EVERY DAY   Magnesium 400 MG Caps Take 400 mg by mouth daily.   nitroGLYCERIN 0.4 MG SL tablet Commonly known as: NITROSTAT Place 1 tablet (0.4 mg total) under the tongue every 5 (five) minutes as needed for chest pain.   OneTouch Ultra test strip Generic drug: glucose blood CHECK SUGAR TWICE A DAY   pantoprazole 40 MG tablet Commonly known as: PROTONIX Take 1 tablet (40 mg total) by mouth 2 (two) times daily before a meal.   PreserVision AREDS Caps Take 1 capsule by mouth 2 (two) times daily.   Turmeric 500 MG Caps Take by mouth daily.        Follow-up Information     Simmons-Robinson, Makiera, MD. Schedule an appointment as soon as possible for a visit in 1 week(s).   Specialty: Family Medicine Contact information: [address] [place] [address] [phone]         Jasmine Rubenstein, MD. Schedule an appointment as soon as possible for a visit.    Specialty: Gastroenterology Why: GI bleed f/u Contact information: [place] [address] [phone]         Jasmine Server, MD. Go to.   Specialty: Oncology Why: on the appt that will be made by cancer center for CB check and Iron infusion Contact information: [place] [address] [phone]                Discharge Exam: Danley Danker Weights   11/25/22 1155 11/25/22 2212  Weight: 65.3 kg 67 kg  alert and oriented times three. Not  in acute distress. Respiratory clear to auscultation cardiovascular both heart sounds normal. Murmur present. Neuro- alert and oriented times three. Skin warm and dry   Condition at discharge: fair  The results of significant diagnostics from this hospitalization (including imaging, microbiology, ancillary and laboratory) are listed below for reference.   Imaging Studies: No results found.  Microbiology: Results for orders placed or performed during the hospital encounter of 11/26/21  Resp Panel by RT-PCR (Flu A&B, Covid) Nasopharyngeal Swab     Status: None   Collection Time: 11/26/21  6:17 PM   Specimen: Nasopharyngeal Swab; Nasopharyngeal(NP) swabs in vial transport medium  Result Value Ref Range Status   SARS Coronavirus 2 by RT PCR NEGATIVE NEGATIVE Final    Comment: (NOTE) SARS-CoV-2 target nucleic acids are NOT DETECTED.  The SARS-CoV-2 RNA is generally detectable in upper respiratory specimens during the acute phase of infection. The lowest concentration of SARS-CoV-2 viral copies this assay can detect is 138 copies/mL. A negative result does not preclude SARS-Cov-2 infection and should not be used as the sole basis for treatment or other patient management decisions. A negative result may occur with  improper specimen collection/handling, submission of specimen other than nasopharyngeal swab, presence of viral mutation(s) within the areas targeted by this assay, and inadequate number of  viral copies(<138 copies/mL). A negative result must be combined with clinical observations, patient history, and epidemiological information. The expected result is Negative.  Fact Sheet for Patients:  EntrepreneurPulse.com.au  Fact Sheet for Webster Providers:  IncredibleEmployment.be  This test is no t yet approved or cleared by the [place] FDA and  has been authorized for detection and/or diagnosis of SARS-CoV-2 by FDA under an Emergency Use Authorization (EUA). This EUA will remain  in effect (meaning this test can be used) for the duration of the COVID-19 declaration under Section 564(b)(1) of the Act, 21 U.S.C.section 360bbb-3(b)(1), unless the authorization is terminated  or revoked sooner.       Influenza A by PCR NEGATIVE NEGATIVE Final   Influenza B by PCR NEGATIVE NEGATIVE Final    Comment: (NOTE) The Xpert Xpress SARS-CoV-2/FLU/RSV plus assay is intended as an aid in the diagnosis of influenza from Nasopharyngeal swab specimens and should not be used as a sole basis for treatment. Nasal washings and aspirates are unacceptable for Xpert Xpress SARS-CoV-2/FLU/RSV testing.  Fact Sheet for Patients: EntrepreneurPulse.com.au  Fact Sheet for Webster Providers: IncredibleEmployment.be  This test is not yet approved or cleared by the [place] FDA and has been authorized for detection and/or diagnosis of SARS-CoV-2 by FDA under an Emergency Use Authorization (EUA). This EUA will remain in effect (meaning this test can be used) for the duration of the COVID-19 declaration under Section 564(b)(1) of the Act, 21 U.S.C. section 360bbb-3(b)(1), unless the authorization is terminated or revoked.  Performed at [facility], [address] [place], [place] [address]     Labs: CBC: Recent Labs  Lab 11/25/22 1056 11/25/22 1206 11/25/22 2245 11/26/22 0955 11/27/22 0317   WBC 5.9 10.2  --   --  5.6  NEUTROABS 3.1  --   --   --   --   HGB 7.9* 8.5* 9.3* 9.6* 8.6*  HCT 24.7* 27.7* 28.6* 29.8* 26.8*  MCV 97.6 101.5*  --   --  94.7  PLT 219 PLATELET CLUMPS NOTED ON SMEAR, UNABLE TO ESTIMATE  --   --  PLATELET CLUMPS NOTED ON SMEAR, UNABLE TO ESTIMATE   Basic Metabolic Panel: Recent Labs  Lab 11/25/22 1206 11/27/22 0317  NA 135 137  K 4.3 3.8  CL 104 107  CO2 21* 25  GLUCOSE 235* 111*  BUN 32* 18  CREATININE 1.18* 0.94  CALCIUM 8.5* 8.2*   Liver Function Tests: Recent Labs  Lab 11/25/22 1206  AST 21  ALT 13  ALKPHOS 77  BILITOT 0.8  PROT 5.7*  ALBUMIN 3.5   CBG: Recent Labs  Lab 11/26/22 0751 11/26/22 1125 11/26/22 1625 11/26/22 2107 11/27/22 0739  GLUCAP 101* 167* 161* 205* 114*    Discharge time spent: greater than 30 minutes.  Signed: Fritzi Mandes, MD [facility] 11/27/2022

## 2022-11-27 NOTE — Telephone Encounter (Signed)
Per MD request, please schedule IV venofer weekly x3 and schedule a lab next week.   Please schedule and inform pt of appts.

## 2022-11-28 ENCOUNTER — Other Ambulatory Visit: Payer: Self-pay | Admitting: Internal Medicine

## 2022-11-28 ENCOUNTER — Telehealth: Payer: Self-pay | Admitting: *Deleted

## 2022-11-28 MED ORDER — PANTOPRAZOLE SODIUM 40 MG PO TBEC: 40.0000 mg | DELAYED_RELEASE_TABLET | Freq: Two times a day (BID) | ORAL | 3 refills | Status: DC

## 2022-11-28 NOTE — Progress Notes (Signed)
By error

## 2022-11-28 NOTE — Patient Outreach (Signed)
  Care Coordination [facility] Note Transition Care Management Follow-up Telephone Call Date of discharge and from where: [facility] [phone] Anemia How have you been since you were released from the hospital? Not to good. I still feel a little weak but I am able to get around Any questions or concerns? Yes Hospitalist stopped Tobrex eye drops and patient wanted to know why. Patient will f/u with PCP  Items Reviewed: Did the pt receive and understand the discharge instructions provided? Yes  Medications obtained and verified? Yes  Other? No  Any new allergies since your discharge? No  Dietary orders reviewed? Yes low sodium Do you have support at home? Yes   Home Care and Equipment/Supplies: Were home health services ordered? no If so, what is the name of the agency? N/a  Has the agency set up a time to come to the patient's home? not applicable Were any new equipment or medical supplies ordered?  Yes:   What is the name of the medical supply agency? N/a Were you able to get the supplies/equipment? not applicable Do you have any questions related to the use of the equipment or supplies? No  Functional Questionnaire: (I = Independent and D = Dependent) ADLs: I  Bathing/Dressing- i  Meal Prep- i  Eating- I  Maintaining continence- I  Transferring/Ambulation- I  Managing Meds- I  Follow up appointments reviewed:  PCP Hospital f/u appt confirmed? Yes   Dr Stark KleinQuentin Cornwall [phone] [facility] f/u appt confirmed? Yes  Oncology lab an infusion [id number] 2:45. Are transportation arrangements needed? No  If their condition worsens, is the pt aware to call PCP or go to the Emergency Dept.? Yes Was the patient provided with contact information for the PCP's office or ED? Yes Was to pt encouraged to call back with questions or concerns? Yes  SDOH assessments and interventions completed:   Yes SDOH Interventions Today    Flowsheet Row Most Recent Value  SDOH  Interventions   Food Insecurity Interventions Intervention Not Indicated  Housing Interventions Intervention Not Indicated  Transportation Interventions Other (Comment)  [patient makes her appointments around her family for transportation]       Care Coordination Interventions:  No Care Coordination interventions needed at this time.   Encounter Outcome:  Pt. Visit Completed    [place] [facility] [phone]

## 2022-11-29 ENCOUNTER — Inpatient Hospital Stay (HOSPITAL_BASED_OUTPATIENT_CLINIC_OR_DEPARTMENT_OTHER): Payer: PPO | Admitting: Oncology

## 2022-11-29 ENCOUNTER — Inpatient Hospital Stay: Payer: PPO

## 2022-11-29 ENCOUNTER — Encounter: Payer: Self-pay | Admitting: Oncology

## 2022-11-29 VITALS — BP 124/42 | HR 72 | Temp 97.1°F

## 2022-11-29 DIAGNOSIS — D631 Anemia in chronic kidney disease: Secondary | ICD-10-CM | POA: Diagnosis not present

## 2022-11-29 DIAGNOSIS — D509 Iron deficiency anemia, unspecified: Secondary | ICD-10-CM | POA: Diagnosis not present

## 2022-11-29 DIAGNOSIS — N183 Chronic kidney disease, stage 3 unspecified: Secondary | ICD-10-CM

## 2022-11-29 DIAGNOSIS — K921 Melena: Secondary | ICD-10-CM

## 2022-11-29 DIAGNOSIS — D500 Iron deficiency anemia secondary to blood loss (chronic): Secondary | ICD-10-CM | POA: Diagnosis not present

## 2022-11-29 LAB — CBC WITH DIFFERENTIAL/PLATELET
Abs Immature Granulocytes: 0.02 10*3/uL (ref 0.00–0.07)
Basophils Absolute: 0.1 10*3/uL (ref 0.0–0.1)
Basophils Relative: 1 %
Eosinophils Absolute: 0.1 10*3/uL (ref 0.0–0.5)
Eosinophils Relative: 1 %
HCT: 30.9 % — ABNORMAL LOW (ref 36.0–46.0)
Hemoglobin: 10.1 g/dL — ABNORMAL LOW (ref 12.0–15.0)
Immature Granulocytes: 0 %
Lymphocytes Relative: 25 %
Lymphs Abs: 1.4 10*3/uL (ref 0.7–4.0)
MCH: 30.9 pg (ref 26.0–34.0)
MCHC: 32.7 g/dL (ref 30.0–36.0)
MCV: 94.5 fL (ref 80.0–100.0)
Monocytes Absolute: 0.6 10*3/uL (ref 0.1–1.0)
Monocytes Relative: 11 %
Neutro Abs: 3.6 10*3/uL (ref 1.7–7.7)
Neutrophils Relative %: 62 %
Platelets: 210 10*3/uL (ref 150–400)
RBC: 3.27 MIL/uL — ABNORMAL LOW (ref 3.87–5.11)
RDW: 14.7 % (ref 11.5–15.5)
WBC: 5.8 10*3/uL (ref 4.0–10.5)
nRBC: 0 % (ref 0.0–0.2)

## 2022-11-29 LAB — SAMPLE TO BLOOD BANK

## 2022-11-29 MED ORDER — SODIUM CHLORIDE 0.9 % IV SOLN
200.0000 mg | Freq: Once | INTRAVENOUS | Status: AC
  Administered 2022-11-29: 200 mg via INTRAVENOUS
  Filled 2022-11-29: qty 200

## 2022-11-29 MED ORDER — SODIUM CHLORIDE 0.9 % IV SOLN
Freq: Once | INTRAVENOUS | Status: AC
  Filled 2022-11-29: qty 250

## 2022-11-30 ENCOUNTER — Encounter: Payer: Self-pay | Admitting: Oncology

## 2022-11-30 NOTE — Progress Notes (Signed)
Hematology/Oncology Progress note [phone] [phone]         Patient Care Team: Eulis Foster, MD as PCP - General (Family Medicine) Minna Merritts, MD as PCP - Cardiology (Cardiology) Birder Robson, MD as Referring Physician (Ophthalmology) Gardiner Barefoot, DPM as Consulting Physician (Podiatry) Rockey Situ, Kathlene November, MD as Consulting Physician (Cardiology)   REFERRING PROVIDER: Donnal Moat*  CHIEF COMPLAINTS/REASON FOR VISIT:  Anemia  ASSESSMENT & PLAN:  Iron deficiency anemia Labs are reviewed and discussed with patient. Hb has improved.  Recommend additional Venofer treatments x 2-4 to further improve iron store.  Repeat cbc, and iron panel in 2 weeks.    Anemia in chronic kidney disease (CKD) anemia secondary to chronic kidney disease,   Melena Recurrent GI bleeding requires blood transfusion. 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   I had a lengthy discussion with patient, her granddaughter, and also her daughter over the phone. patient is at high risk of colonoscopy due to anesthesia risk. Patient is ok with a discussion.Referral to [facility] for evaluation.   Orders Placed This Encounter  Procedures   CBC with Differential/Platelet    Standing Status:   Future    Standing Expiration Date:   11/29/2023   Ferritin    Standing Status:   Future    Standing Expiration Date:   11/30/2023   Iron and TIBC([facility])    Standing Status:   Future    Standing Expiration Date:   11/30/2023   Hold Tube- Blood Bank    Standing Status:   Future    Standing Expiration Date:   11/30/2023   Follow-up -ER evaluation.  All questions were answered. The patient knows to call the clinic with any problems, questions or concerns.  Earlie Server, MD, PhD [facility] Hematology Oncology 11/29/2022     HISTORY OF PRESENTING ILLNESS:  ARMINA GALLOWAY is a  89 y.o.  female with PMH listed below who was referred to me for  anemia Reviewed patient's recent labs that was done.   Reviewed patient's previous labs ordered by primary care physician's office, anemia is chronic onset , duration is since January 2023. Patient reports very fatigued, lightheaded, shortness of breath with minimal exertion. She denies recent chest pain on exertion, pre-syncopal episodes, or palpitations She had not noticed any recent bleeding such as epistaxis, hematuria or hematochezia.  +Dark stool She denies over the counter NSAID ingestion. She is on antiplatelets agent-aspirin 81 mg. She denies any pica and eats a variety of diet.  She was hospitalized from 05/21/2022 - 05/24/2022, due to NSTEMI and acute CHF exacerbation, symptomatic anemia, status post 2 unit of PRBC transfusion.  Patient reports feeling better after blood transfusion. Cardiology recommends no invasive intervention during this admission.  There is plan for possible TAVR   INTERVAL HISTORY SYMIA HERDT is a 89 y.o. female who has above history reviewed by me today presents for acute visit for iron deficiency anemia, follow up after recent hospitalization.  She still feels tired. Melena has improved. Accompanied by granddaughter.   MEDICAL HISTORY:  Past Medical History:  Diagnosis Date   Aortic stenosis    severe   Arthritis    Cataract    cataract removal bilaterally approx 20 years ago   Chronic back pain    Coronary artery disease    COVID-19 virus infection 12/2020   Diabetes mellitus without complication (HCC)    Heart murmur    Hyperlipidemia    Hypertension     SURGICAL  HISTORY: Past Surgical History:  Procedure Laterality Date   ABDOMINAL HYSTERECTOMY     APPENDECTOMY     BACK SURGERY     three   CARDIAC CATHETERIZATION     6+yrs no stents Dr. Verneda Skill   CATARACT EXTRACTION     RIGHT/LEFT HEART CATH AND CORONARY ANGIOGRAPHY N/A 04/26/2022   Procedure: RIGHT/LEFT HEART CATH AND CORONARY ANGIOGRAPHY;  Surgeon: Wellington Hampshire, MD;   Location: [place] [facility];  Service: Cardiovascular;  Laterality: N/A;   THORACOLUMBAR SYRINGO SHUNT     TONSILLECTOMY AND ADENOIDECTOMY      SOCIAL HISTORY: Social History   Socioeconomic History   Marital status: Widowed    Spouse name: Not on file   Number of children: 2   Years of education: Not on file   Highest education level: 8th grade  Occupational History   Occupation: retired  Tobacco Use   Smoking status: Never   Smokeless tobacco: Never  Vaping Use   Vaping Use: Never used  Substance and Sexual Activity   Alcohol use: No    Alcohol/week: 0.0 standard drinks of alcohol   Drug use: No   Sexual activity: Not Currently  Other Topics Concern   Not on file  Social History Narrative   Lives with daughter, Katherina Right   Social Determinants of Health   Financial Resource Strain: Low Risk  (09/11/2022)   Overall Financial Resource Strain (CARDIA)    Difficulty of Paying Living Expenses: Not hard at all  Food Insecurity: No Food Insecurity (11/28/2022)   Hunger Vital Sign    Worried About Running Out of Food in the Last Year: Never true    [place] in the Last Year: Never true  Transportation Needs: No Transportation Needs (11/28/2022)   PRAPARE - Hydrologist (Medical): No    Lack of Transportation (Non-Medical): No  Physical Activity: Insufficiently Active (09/11/2022)   Exercise Vital Sign    Days of Exercise per Week: 2 days    Minutes of Exercise per Session: 20 min  Stress: No Stress Concern Present (09/11/2022)   [place]    Feeling of Stress : Not at all  Social Connections: Moderately Isolated (09/11/2022)   Social Connection and Isolation Panel [NHANES]    Frequency of Communication with Friends and Family: More than three times a week    Frequency of Social Gatherings with Friends and Family: Twice a week    Attends Religious Services: More  than 4 times per year    Active Member of [employer] or Organizations: No    Attends Archivist Meetings: Never    Marital Status: Widowed  Intimate Partner Violence: Not At Risk (11/25/2022)   Humiliation, Afraid, Rape, and Kick questionnaire    Fear of Current or Ex-Partner: No    Emotionally Abused: No    Physically Abused: No    Sexually Abused: No    FAMILY HISTORY: Family History  Problem Relation Age of Onset   Heart disease Mother        died from MI   Heart attack Mother    Heart disease Sister    Sudden death Brother        shot and beaten to death during home invasion   Stroke Sister    Sudden death Sister        MVA   Cancer Brother        died  from lung cancer   Sudden death Sister        59 days old   Cancer Brother        died from lung cancer   Stroke Brother        cause of death   Diabetes Father    Stroke Father    Pneumonia Father     ALLERGIES:  is allergic to etodolac, naproxen, statins, latex, terbinafine, and terbinafine hcl.  MEDICATIONS:  Current Outpatient Medications  Medication Sig Dispense Refill   ALPRAZolam (XANAX) 0.5 MG tablet TAKE 1 TABLET BY MOUTH EVERY DAY AT BEDTIME AS NEEDED 30 tablet 4   ascorbic acid (VITAMIN C) 250 MG tablet Take 1 tablet (250 mg total) by mouth daily. 30 tablet 1   co-enzyme Q-10 30 MG capsule Take 30 mg by mouth daily.     cyanocobalamin (VITAMIN B12) 500 MCG tablet Take 1 tablet (500 mcg total) by mouth once a week. 30 tablet 1   ezetimibe (ZETIA) 10 MG tablet TAKE 1 TABLET BY MOUTH EVERY DAY 30 tablet 11   furosemide (LASIX) 20 MG tablet Take 1 tablet (20 mg total) by mouth every other day. 45 tablet 0   insulin glargine-yfgn (SEMGLEE, YFGN,) 100 UNIT/ML Pen INJECT 18 UNITS SUBCUTANEOUSLY 9 mL 2   Insulin Pen Needle (B-D ULTRAFINE III SHORT PEN) 31G X 8 MM MISC Use for administering glucose twice daily 200 each 6   iron polysaccharides (NIFEREX) 150 MG capsule Take 1 capsule (150 mg total) by mouth  daily. 30 capsule 1   lisinopril (ZESTRIL) 10 MG tablet TAKE 1 TABLET BY MOUTH EVERY DAY 30 tablet 11   Magnesium 400 MG CAPS Take 400 mg by mouth daily.     Multiple Vitamins-Minerals (PRESERVISION AREDS) CAPS Take 1 capsule by mouth 2 (two) times daily.      nitroGLYCERIN (NITROSTAT) 0.4 MG SL tablet Place 1 tablet (0.4 mg total) under the tongue every 5 (five) minutes as needed for chest pain. 25 tablet 3   Omega-3 Fatty Acids (FISH OIL PO) Take by mouth daily.     ONETOUCH ULTRA test strip CHECK SUGAR TWICE A DAY 200 strip 3   pantoprazole (PROTONIX) 40 MG tablet Take 1 tablet (40 mg total) by mouth 2 (two) times daily before a meal. 60 tablet 3   Turmeric 500 MG CAPS Take by mouth daily.     Artificial Tear Ointment (DRY EYES OP) Apply 1 drop to eye daily. (Patient not taking: Reported on 11/29/2022)     No current facility-administered medications for this visit.    Review of Systems  Constitutional:  Positive for fatigue. Negative for chills and fever.  HENT:   Negative for hearing loss and voice change.   Eyes:  Negative for eye problems.  Respiratory:  Negative for chest tightness, cough and shortness of breath.   Cardiovascular:  Negative for chest pain.  Gastrointestinal:  Negative for abdominal distention and abdominal pain.       Dark stool  Endocrine: Negative for hot flashes.  Genitourinary:  Negative for difficulty urinating and frequency.   Musculoskeletal:  Negative for arthralgias.  Skin:  Negative for itching and rash.  Neurological:  Negative for extremity weakness and light-headedness.  Hematological:  Negative for adenopathy.  Psychiatric/Behavioral:  Negative for confusion.     PHYSICAL EXAMINATION: ECOG PERFORMANCE STATUS: 2 - Symptomatic, <50% confined to bed Vitals:   11/29/22 1117  BP: (!) 124/42  Pulse: 72  Temp: (!) 97.1 F (  36.2 C)  SpO2: 99%   Physical Exam Constitutional:      General: She is not in acute distress.    Comments: Patient sits  in a wheelchair  HENT:     Head: Normocephalic and atraumatic.  Eyes:     General: No scleral icterus. Cardiovascular:     Rate and Rhythm: Normal rate and regular rhythm.     Heart sounds: Murmur heard.  Pulmonary:     Effort: Pulmonary effort is normal. No respiratory distress.     Breath sounds: No wheezing.  Abdominal:     General: Bowel sounds are normal. There is no distension.     Palpations: Abdomen is soft.  Musculoskeletal:        General: No deformity. Normal range of motion.     Cervical back: Normal range of motion and neck supple.     Comments: Trace edema bilaterally  Skin:    General: Skin is warm and dry.     Coloration: Skin is pale.     Findings: No erythema or rash.  Neurological:     Mental Status: She is alert and oriented to person, place, and time. Mental status is at baseline.     Cranial Nerves: No cranial nerve deficit.  Psychiatric:        Mood and Affect: Mood normal.      LABORATORY DATA:  I have reviewed the data as listed    Latest Ref Rng & Units 11/29/2022   10:48 AM 11/27/2022    3:17 AM 11/26/2022    9:55 AM  CBC  WBC 4.0 - 10.5 K/uL 5.8  5.6    Hemoglobin 12.0 - 15.0 g/dL 10.1  8.6  9.6   Hematocrit 36.0 - 46.0 % 30.9  26.8  29.8   Platelets 150 - 400 K/uL 210  PLATELET CLUMPS NOTED ON SMEAR, UNABLE TO ESTIMATE        Latest Ref Rng & Units 11/27/2022    3:17 AM 11/25/2022   12:06 PM 11/20/2022   11:20 AM  CMP  Glucose 70 - 99 mg/dL 111  235  178   BUN 8 - 23 mg/dL 18  32  36   Creatinine 0.44 - 1.00 mg/dL 0.94  1.18  1.09   Sodium 135 - 145 mmol/L 137  135  134   Potassium 3.5 - 5.1 mmol/L 3.8  4.3  5.1   Chloride 98 - 111 mmol/L 107  104  103   CO2 22 - 32 mmol/L '25  21  25   '$ Calcium 8.9 - 10.3 mg/dL 8.2  8.5  8.4   Total Protein 6.5 - 8.1 g/dL  5.7  6.1   Total Bilirubin 0.3 - 1.2 mg/dL  0.8  0.9   Alkaline Phos 38 - 126 U/L  77  96   AST 15 - 41 U/L  21  20   ALT 0 - 44 U/L  13  15       Component Value Date/Time   IRON  74 11/20/2022 1130   IRON 36 12/04/2021 1302   TIBC 361 11/20/2022 1130   TIBC 327 12/04/2021 1302   FERRITIN 27 11/20/2022 1120   FERRITIN 115 10/02/2022 1305   IRONPCTSAT 21 11/20/2022 1130   IRONPCTSAT 11 (L) 12/04/2021 1302     RADIOGRAPHIC STUDIES: I have personally reviewed the radiological images as listed and agreed with the findings in the report. No results found.

## 2022-11-30 NOTE — Assessment & Plan Note (Signed)
anemia secondary to chronic kidney disease,

## 2022-11-30 NOTE — Assessment & Plan Note (Addendum)
Labs are reviewed and discussed with patient. Hb has improved.  Recommend additional Venofer treatments x 2-4 to further improve iron store.  Repeat cbc, and iron panel in 2 weeks.

## 2022-11-30 NOTE — Assessment & Plan Note (Addendum)
Recurrent GI bleeding requires blood transfusion. 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   I had a lengthy discussion with patient, her granddaughter, and also her daughter over the phone. patient is at high risk of colonoscopy due to anesthesia risk. Patient is ok with a discussion.Referral to [facility] for evaluation.

## 2022-12-02 ENCOUNTER — Inpatient Hospital Stay: Payer: PPO

## 2022-12-02 ENCOUNTER — Other Ambulatory Visit: Payer: PPO

## 2022-12-04 NOTE — Progress Notes (Addendum)
I,Connie R Striblin,acting as a Education administrator for [employer], MD.,have documented all relevant documentation on the behalf of Eulis Foster, MD,as directed by  Eulis Foster, MD while in the presence of Eulis Foster, MD.   Established patient visit   Patient: Jasmine Webster   DOB: 1926/04/13   89 y.o. Female  MRN: [id number] Visit Date: 12/05/2022  Today's healthcare provider: Eulis Foster, MD   Chief Complaint  Patient presents with   Hospitalization Follow-up   Subjective    HPI  Follow up Hospitalization  Patient was admitted to [facility] on 11/25/22 for episodes of melena and blood loss anemia and discharged on 11/27/22. She was treated for episodes of melena and blood loss anemia . Treatment for this included a  blood transfusion, IV pantoprazole, and an  iron infusion  She had iron infusion on 11/29/22 She has been evaluated by hematology with recommendations including additional Venofer (iron) treatments x 2-4 to improve iron storage followed by repeat CBC and iron panel   She is waiting to be scheduled for follow up with [facility] for gastroenterology, has not been completed as of now. Pt is waiting to be contacted Telephone follow up was done on 11/28/22 She reports excellent compliance with treatment. She reports this condition is improved. She complains of weakness and fatigue. She would like to discuss the medication changes from the hospital concerning ASA  ----------------------------------------------------------------------------------------- -   Medications: Outpatient Medications Prior to Visit  Medication Sig   ALPRAZolam (XANAX) 0.5 MG tablet TAKE 1 TABLET BY MOUTH EVERY DAY AT BEDTIME AS NEEDED   Artificial Tear Ointment (DRY EYES OP) Apply 1 drop to eye daily.   ascorbic acid (VITAMIN C) 250 MG tablet Take 1 tablet (250 mg total) by mouth daily.   co-enzyme Q-10 30 MG capsule Take 30 mg by mouth daily.    cyanocobalamin (VITAMIN B12) 500 MCG tablet Take 1 tablet (500 mcg total) by mouth once a week.   ezetimibe (ZETIA) 10 MG tablet TAKE 1 TABLET BY MOUTH EVERY DAY   furosemide (LASIX) 20 MG tablet Take 1 tablet (20 mg total) by mouth every other day.   insulin glargine-yfgn (SEMGLEE, YFGN,) 100 UNIT/ML Pen INJECT 18 UNITS SUBCUTANEOUSLY   Insulin Pen Needle (B-D ULTRAFINE III SHORT PEN) 31G X 8 MM MISC Use for administering glucose twice daily   iron polysaccharides (NIFEREX) 150 MG capsule Take 1 capsule (150 mg total) by mouth daily.   lisinopril (ZESTRIL) 10 MG tablet TAKE 1 TABLET BY MOUTH EVERY DAY   Magnesium 400 MG CAPS Take 400 mg by mouth daily.   Multiple Vitamins-Minerals (PRESERVISION AREDS) CAPS Take 1 capsule by mouth 2 (two) times daily.    nitroGLYCERIN (NITROSTAT) 0.4 MG SL tablet Place 1 tablet (0.4 mg total) under the tongue every 5 (five) minutes as needed for chest pain.   Omega-3 Fatty Acids (FISH OIL PO) Take by mouth daily.   ONETOUCH ULTRA test strip CHECK SUGAR TWICE A DAY   pantoprazole (PROTONIX) 40 MG tablet Take 1 tablet (40 mg total) by mouth 2 (two) times daily before a meal.   Turmeric 500 MG CAPS Take by mouth daily.   No facility-administered medications prior to visit.    Review of Systems     Objective    BP (!) 122/51 (BP Location: Left Arm, Patient Position: Sitting, Cuff Size: Normal)   Pulse 71   Temp 97.7 F (36.5 C) (Oral)   Wt 147 lb 12.8 oz (67 kg)  SpO2 100%   BMI 25.37 kg/m    Physical Exam Vitals reviewed.  Constitutional:      General: She is not in acute distress.    Appearance: Normal appearance. She is not ill-appearing, toxic-appearing or diaphoretic.  Eyes:     Conjunctiva/sclera: Conjunctivae normal.  Cardiovascular:     Rate and Rhythm: Normal rate and regular rhythm.     Pulses: Normal pulses.     Heart sounds: Murmur heard.     No friction rub. No gallop.     Comments: Aortic stenosis murmur Pulmonary:      Effort: Pulmonary effort is normal. No respiratory distress.     Breath sounds: Normal breath sounds. No stridor. No wheezing, rhonchi or rales.  Abdominal:     General: Bowel sounds are normal. There is no distension.     Palpations: Abdomen is soft.     Tenderness: There is no abdominal tenderness.  Musculoskeletal:     Right lower leg: No edema.     Left lower leg: Edema present.     Comments: Chronic  Skin:    Findings: No erythema or rash.  Neurological:     Mental Status: She is alert and oriented to person, place, and time.       No results found for any visits on 12/05/22.  Assessment & Plan     Problem List Items Addressed This Visit       Digestive   Melena - Primary (Chronic)    Continues to have dark stool however reports receiving iron infusions She continues to follow with hematology/oncology for normocytic anemia Last hemoglobin measured at TM which was improved from 7.9 during hospitalization Patient awaiting appointment with [facility] gastroenterology Advised that she continue to hold aspirin and follow-up with our office in 2 weeks  UPDATE: patient has appt with Dr. Heide Guile GI for May 10.2024 10am         Genitourinary   Anemia in chronic kidney disease (CKD) (Chronic)    Last creatinine was within normal range Patient has scheduled appointment with nephrology but may need to reschedule this given frequent follow-up regarding her anemia and awaiting specialist evaluation for melena Last hemoglobin was 10, improved from previous levels No changes to medications today She will continue to hold aspirin in setting of unknown source of bleeding        Other   Malaise and fatigue    Patient continues to feel fatigued but states that she is feeling improved from when she first was admitted to the hospital No changes to medications at this time Continue to follow-up with hematology/oncology for low hemoglobin and iron transfusion        Return in about 2  weeks (around 12/19/2022) for Anemia.        The entirety of the information documented in the History of Present Illness, Review of Systems and Physical Exam were personally obtained by me. Portions of this information were initially documented by Eduard Clos, CMA and reviewed by me for thoroughness and accuracy.Eulis Foster, MD     Eulis Foster, MD  [facility] [phone] (phone) [phone] (fax)  [place]

## 2022-12-05 ENCOUNTER — Ambulatory Visit (INDEPENDENT_AMBULATORY_CARE_PROVIDER_SITE_OTHER): Payer: PPO | Admitting: Family Medicine

## 2022-12-05 ENCOUNTER — Encounter: Payer: Self-pay | Admitting: Family Medicine

## 2022-12-05 VITALS — BP 122/51 | HR 71 | Temp 97.7°F | Wt 147.8 lb

## 2022-12-05 DIAGNOSIS — R5381 Other malaise: Secondary | ICD-10-CM | POA: Diagnosis not present

## 2022-12-05 DIAGNOSIS — R5383 Other fatigue: Secondary | ICD-10-CM

## 2022-12-05 DIAGNOSIS — D631 Anemia in chronic kidney disease: Secondary | ICD-10-CM

## 2022-12-05 DIAGNOSIS — K921 Melena: Secondary | ICD-10-CM | POA: Diagnosis not present

## 2022-12-05 DIAGNOSIS — N183 Chronic kidney disease, stage 3 unspecified: Secondary | ICD-10-CM | POA: Diagnosis not present

## 2022-12-05 NOTE — Assessment & Plan Note (Addendum)
Continues to have dark stool however reports receiving iron infusions She continues to follow with hematology/oncology for normocytic anemia Last hemoglobin measured at TM which was improved from 7.9 during hospitalization Patient awaiting appointment with [facility] gastroenterology Advised that she continue to hold aspirin and follow-up with our office in 2 weeks  UPDATE: patient has appt with Dr. Heide Guile GI for May 10.2024 10am

## 2022-12-05 NOTE — Assessment & Plan Note (Signed)
Patient continues to feel fatigued but states that she is feeling improved from when she first was admitted to the hospital No changes to medications at this time Continue to follow-up with hematology/oncology for low hemoglobin and iron transfusion

## 2022-12-05 NOTE — Assessment & Plan Note (Signed)
Last creatinine was within normal range Patient has scheduled appointment with nephrology but may need to reschedule this given frequent follow-up regarding her anemia and awaiting specialist evaluation for melena Last hemoglobin was 10, improved from previous levels No changes to medications today She will continue to hold aspirin in setting of unknown source of bleeding

## 2022-12-06 ENCOUNTER — Inpatient Hospital Stay: Payer: PPO

## 2022-12-06 ENCOUNTER — Telehealth: Payer: Self-pay | Admitting: Family Medicine

## 2022-12-06 VITALS — BP 131/46 | HR 72 | Temp 98.2°F | Resp 18

## 2022-12-06 DIAGNOSIS — D500 Iron deficiency anemia secondary to blood loss (chronic): Secondary | ICD-10-CM

## 2022-12-06 DIAGNOSIS — D509 Iron deficiency anemia, unspecified: Secondary | ICD-10-CM | POA: Diagnosis not present

## 2022-12-06 LAB — CBC WITH DIFFERENTIAL/PLATELET
Abs Immature Granulocytes: 0.02 10*3/uL (ref 0.00–0.07)
Basophils Absolute: 0.1 10*3/uL (ref 0.0–0.1)
Basophils Relative: 1 %
Eosinophils Absolute: 0.1 10*3/uL (ref 0.0–0.5)
Eosinophils Relative: 1 %
HCT: 30.6 % — ABNORMAL LOW (ref 36.0–46.0)
Hemoglobin: 10.2 g/dL — ABNORMAL LOW (ref 12.0–15.0)
Immature Granulocytes: 0 %
Lymphocytes Relative: 30 %
Lymphs Abs: 1.9 10*3/uL (ref 0.7–4.0)
MCH: 31.6 pg (ref 26.0–34.0)
MCHC: 33.3 g/dL (ref 30.0–36.0)
MCV: 94.7 fL (ref 80.0–100.0)
Monocytes Absolute: 0.7 10*3/uL (ref 0.1–1.0)
Monocytes Relative: 11 %
Neutro Abs: 3.7 10*3/uL (ref 1.7–7.7)
Neutrophils Relative %: 57 %
Platelets: 255 10*3/uL (ref 150–400)
RBC: 3.23 MIL/uL — ABNORMAL LOW (ref 3.87–5.11)
RDW: 15.6 % — ABNORMAL HIGH (ref 11.5–15.5)
WBC: 6.5 10*3/uL (ref 4.0–10.5)
nRBC: 0 % (ref 0.0–0.2)

## 2022-12-06 LAB — FERRITIN: Ferritin: 173 ng/mL (ref 11–307)

## 2022-12-06 LAB — IRON AND TIBC
Iron: 80 ug/dL (ref 28–170)
Saturation Ratios: 24 % (ref 10.4–31.8)
TIBC: 339 ug/dL (ref 250–450)
UIBC: 259 ug/dL

## 2022-12-06 LAB — SAMPLE TO BLOOD BANK

## 2022-12-06 MED ORDER — SODIUM CHLORIDE 0.9 % IV SOLN
200.0000 mg | Freq: Once | INTRAVENOUS | Status: AC
  Administered 2022-12-06: 200 mg via INTRAVENOUS
  Filled 2022-12-06: qty 200

## 2022-12-06 NOTE — Telephone Encounter (Signed)
Pt is calling to let Dr. Alba Cory know that she got an appt with Dr. Heide Guile GI for May 10.2024 10am. [phone]

## 2022-12-11 ENCOUNTER — Inpatient Hospital Stay: Payer: PPO

## 2022-12-11 VITALS — BP 153/61 | HR 78 | Temp 97.4°F | Resp 16

## 2022-12-11 DIAGNOSIS — D500 Iron deficiency anemia secondary to blood loss (chronic): Secondary | ICD-10-CM

## 2022-12-11 DIAGNOSIS — D509 Iron deficiency anemia, unspecified: Secondary | ICD-10-CM | POA: Diagnosis not present

## 2022-12-11 MED ORDER — SODIUM CHLORIDE 0.9 % IV SOLN
200.0000 mg | Freq: Once | INTRAVENOUS | Status: AC
  Administered 2022-12-11: 200 mg via INTRAVENOUS
  Filled 2022-12-11: qty 200

## 2022-12-11 MED ORDER — SODIUM CHLORIDE 0.9 % IV SOLN
INTRAVENOUS | Status: AC
  Filled 2022-12-11 (×2): qty 250

## 2022-12-11 NOTE — Patient Instructions (Signed)
Iron Sucrose Injection What is this medication? IRON SUCROSE (EYE ern SOO krose) treats low levels of iron (iron deficiency anemia) in people with kidney disease. Iron is a mineral that plays an important role in making red blood cells, which carry oxygen from your lungs to the rest of your body. This medicine may be used for other purposes; ask your health care provider or pharmacist if you have questions. COMMON BRAND NAME(S): Venofer What should I tell my care team before I take this medication? They need to know if you have any of these conditions: Anemia not caused by low iron levels Heart disease High levels of iron in the blood Kidney disease Liver disease An unusual or allergic reaction to iron, other medications, foods, dyes, or preservatives Pregnant or trying to get pregnant Breastfeeding How should I use this medication? This medication is for infusion into a vein. It is given in a hospital or clinic setting. Talk to your care team about the use of this medication in children. While this medication may be prescribed for children as young as 2 years for selected conditions, precautions do apply. Overdosage: If you think you have taken too much of this medicine contact a poison control center or emergency room at once. NOTE: This medicine is only for you. Do not share this medicine with others. What if I miss a dose? Keep appointments for follow-up doses. It is important not to miss your dose. Call your care team if you are unable to keep an appointment. What may interact with this medication? Do not take this medication with any of the following: Deferoxamine Dimercaprol Other iron products This medication may also interact with the following: Chloramphenicol Deferasirox This list may not describe all possible interactions. Give your health care provider a list of all the medicines, herbs, non-prescription drugs, or dietary supplements you use. Also tell them if you smoke,  drink alcohol, or use illegal drugs. Some items may interact with your medicine. What should I watch for while using this medication? Visit your care team regularly. Tell your care team if your symptoms do not start to get better or if they get worse. You may need blood work done while you are taking this medication. You may need to follow a special diet. Talk to your care team. Foods that contain iron include: whole grains/cereals, dried fruits, beans, or peas, leafy green vegetables, and organ meats (liver, kidney). What side effects may I notice from receiving this medication? Side effects that you should report to your care team as soon as possible: Allergic reactions--skin rash, itching, hives, swelling of the face, lips, tongue, or throat Low blood pressure--dizziness, feeling faint or lightheaded, blurry vision Shortness of breath Side effects that usually do not require medical attention (report to your care team if they continue or are bothersome): Flushing Headache Joint pain Muscle pain Nausea Pain, redness, or irritation at injection site This list may not describe all possible side effects. Call your doctor for medical advice about side effects. You may report side effects to FDA at 1-800-FDA-1088. Where should I keep my medication? This medication is given in a hospital or clinic and will not be stored at home. NOTE: This sheet is a summary. It may not cover all possible information. If you have questions about this medicine, talk to your doctor, pharmacist, or health care provider.  2023 Elsevier/Gold Standard (2021-02-15 00:00:00)

## 2022-12-12 ENCOUNTER — Other Ambulatory Visit: Payer: Self-pay | Admitting: Cardiovascular Disease

## 2022-12-16 ENCOUNTER — Inpatient Hospital Stay: Payer: PPO

## 2022-12-20 MED FILL — Iron Sucrose Inj 20 MG/ML (Fe Equiv): INTRAVENOUS | Qty: 10 | Status: AC

## 2022-12-23 ENCOUNTER — Other Ambulatory Visit: Payer: Self-pay

## 2022-12-23 ENCOUNTER — Inpatient Hospital Stay: Payer: PPO | Attending: Oncology

## 2022-12-23 DIAGNOSIS — Z79899 Other long term (current) drug therapy: Secondary | ICD-10-CM | POA: Diagnosis not present

## 2022-12-23 DIAGNOSIS — Z794 Long term (current) use of insulin: Secondary | ICD-10-CM | POA: Diagnosis not present

## 2022-12-23 DIAGNOSIS — E1122 Type 2 diabetes mellitus with diabetic chronic kidney disease: Secondary | ICD-10-CM | POA: Insufficient documentation

## 2022-12-23 DIAGNOSIS — I129 Hypertensive chronic kidney disease with stage 1 through stage 4 chronic kidney disease, or unspecified chronic kidney disease: Secondary | ICD-10-CM | POA: Diagnosis not present

## 2022-12-23 DIAGNOSIS — D631 Anemia in chronic kidney disease: Secondary | ICD-10-CM | POA: Insufficient documentation

## 2022-12-23 DIAGNOSIS — I252 Old myocardial infarction: Secondary | ICD-10-CM | POA: Insufficient documentation

## 2022-12-23 DIAGNOSIS — D500 Iron deficiency anemia secondary to blood loss (chronic): Secondary | ICD-10-CM

## 2022-12-23 DIAGNOSIS — I2510 Atherosclerotic heart disease of native coronary artery without angina pectoris: Secondary | ICD-10-CM | POA: Insufficient documentation

## 2022-12-23 DIAGNOSIS — D509 Iron deficiency anemia, unspecified: Secondary | ICD-10-CM | POA: Insufficient documentation

## 2022-12-23 DIAGNOSIS — N189 Chronic kidney disease, unspecified: Secondary | ICD-10-CM | POA: Insufficient documentation

## 2022-12-23 LAB — CBC WITH DIFFERENTIAL/PLATELET
Abs Immature Granulocytes: 0.01 10*3/uL (ref 0.00–0.07)
Basophils Absolute: 0.1 10*3/uL (ref 0.0–0.1)
Basophils Relative: 1 %
Eosinophils Absolute: 0.1 10*3/uL (ref 0.0–0.5)
Eosinophils Relative: 1 %
HCT: 32.4 % — ABNORMAL LOW (ref 36.0–46.0)
Hemoglobin: 10.6 g/dL — ABNORMAL LOW (ref 12.0–15.0)
Immature Granulocytes: 0 %
Lymphocytes Relative: 31 %
Lymphs Abs: 1.4 10*3/uL (ref 0.7–4.0)
MCH: 32 pg (ref 26.0–34.0)
MCHC: 32.7 g/dL (ref 30.0–36.0)
MCV: 97.9 fL (ref 80.0–100.0)
Monocytes Absolute: 0.5 10*3/uL (ref 0.1–1.0)
Monocytes Relative: 12 %
Neutro Abs: 2.4 10*3/uL (ref 1.7–7.7)
Neutrophils Relative %: 55 %
Platelets: 174 10*3/uL (ref 150–400)
RBC: 3.31 MIL/uL — ABNORMAL LOW (ref 3.87–5.11)
RDW: 14.6 % (ref 11.5–15.5)
WBC: 4.4 10*3/uL (ref 4.0–10.5)
nRBC: 0 % (ref 0.0–0.2)

## 2022-12-23 LAB — IRON AND TIBC
Iron: 129 ug/dL (ref 28–170)
Saturation Ratios: 46 % — ABNORMAL HIGH (ref 10.4–31.8)
TIBC: 281 ug/dL (ref 250–450)
UIBC: 152 ug/dL

## 2022-12-23 LAB — FERRITIN: Ferritin: 193 ng/mL (ref 11–307)

## 2022-12-25 ENCOUNTER — Telehealth: Payer: Self-pay | Admitting: Family Medicine

## 2022-12-25 MED ORDER — POTASSIUM CHLORIDE ER 10 MEQ PO TBCR: 10.0000 meq | EXTENDED_RELEASE_TABLET | ORAL | 1 refills | Status: DC | PRN

## 2022-12-25 NOTE — Telephone Encounter (Signed)
6mq prescribed to take every other day with lasix.   MEulis Foster MD  [facility]

## 2022-12-25 NOTE — Telephone Encounter (Signed)
Pt called in to request a refill for her potassium. Pt says that she requested via pharmacy days ago, not showing in chart.     Pharmacy:  [id number]: [phone] Fax: [phone]     Future appt: 03/2023 b

## 2022-12-26 ENCOUNTER — Other Ambulatory Visit: Payer: Self-pay | Admitting: Physician Assistant

## 2022-12-26 DIAGNOSIS — Z794 Long term (current) use of insulin: Secondary | ICD-10-CM

## 2022-12-26 DIAGNOSIS — E1159 Type 2 diabetes mellitus with other circulatory complications: Secondary | ICD-10-CM

## 2022-12-27 NOTE — Telephone Encounter (Signed)
Requested medication (s) are due for refill today - yes  Requested medication (s) are on the active medication list -yes  Future visit scheduled -yes  Last refill: 10/05/22 8m 2RF  Notes to clinic: medication not assigned protocol- provider review   Requested Prescriptions  Pending Prescriptions Disp Refills   SEMGLEE, YFGN, 100 UNIT/ML Pen [Pharmacy Med Name: SEMGLEE (YFGN) 100 UNIT/ML PEN]      Sig: INJECT 25 UNITS SUBCUTANEOUSLY     Off-Protocol Failed - 12/26/2022  5:51 PM      Failed - Medication not assigned to a protocol, review manually.      Passed - Valid encounter within last 12 months    Recent Outpatient Visits           3 weeks ago [place]   2 months ago Benign essential HTN   [place]   4 months ago Walking pneumonia   [facility]   5 months ago Walking pneumonia   [facility]   8 months ago Controlled type 2 diabetes mellitus with other circulatory complication, unspecified whether long term insulin use ([place]   [place]       Future Appointments             In 2 months Gollan, TKathlene November [place]  In 3 months Simmons-Robinson, [facility]              Requested Prescriptions  Pending Prescriptions Disp Refills   SEMGLEE, YFGN, 100 UNIT/ML Pen [Pharmacy Med Name: SEMGLEE (YFGN) 100 UNIT/ML PEN]      Sig: INJECT 25 UNITS SUBCUTANEOUSLY     Off-Protocol Failed - 12/26/2022  5:51 PM      Failed - Medication not assigned to a protocol, review manually.      Passed - Valid encounter within last 12 months    Recent Outpatient Visits           3 weeks ago [place]   2 months ago Benign essential HTN   [place] MD   4 months ago [facility]   [facility]   5 months ago Walking pneumonia   [facility]   8 months ago Controlled type 2 diabetes mellitus with other circulatory complication, unspecified whether long term insulin use ([place]   [place] MD       Future Appointments             In 2 months Gollan, TKathlene November [place]  In 3 months Simmons-Robinson, MRiki Sheer MD [facility] [place]

## 2022-12-30 MED FILL — Iron Sucrose Inj 20 MG/ML (Fe Equiv): INTRAVENOUS | Qty: 10 | Status: AC

## 2022-12-31 ENCOUNTER — Inpatient Hospital Stay: Payer: PPO

## 2022-12-31 ENCOUNTER — Inpatient Hospital Stay (HOSPITAL_BASED_OUTPATIENT_CLINIC_OR_DEPARTMENT_OTHER): Payer: PPO | Admitting: Oncology

## 2022-12-31 ENCOUNTER — Encounter: Payer: Self-pay | Admitting: Oncology

## 2022-12-31 VITALS — BP 143/48 | HR 73 | Temp 97.7°F | Wt 151.8 lb

## 2022-12-31 DIAGNOSIS — K921 Melena: Secondary | ICD-10-CM | POA: Diagnosis not present

## 2022-12-31 DIAGNOSIS — N183 Chronic kidney disease, stage 3 unspecified: Secondary | ICD-10-CM

## 2022-12-31 DIAGNOSIS — D500 Iron deficiency anemia secondary to blood loss (chronic): Secondary | ICD-10-CM | POA: Diagnosis not present

## 2022-12-31 DIAGNOSIS — D509 Iron deficiency anemia, unspecified: Secondary | ICD-10-CM | POA: Diagnosis not present

## 2022-12-31 DIAGNOSIS — D631 Anemia in chronic kidney disease: Secondary | ICD-10-CM

## 2022-12-31 NOTE — Assessment & Plan Note (Signed)
anemia secondary to chronic kidney disease, hemoglobin is above 10.  No need for erythropoietin therapy. Continue oral iron supplementation.

## 2022-12-31 NOTE — Assessment & Plan Note (Addendum)
Labs are reviewed and discussed with patient. Both hemoglobin and iron panel have improved. No need for additional IV Venofer treatment today.  Continue oral iron supplementation.

## 2022-12-31 NOTE — Assessment & Plan Note (Signed)
Recurrent GI bleeding requires blood transfusion. 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   Currently her melena has completely resolved.  Continue monitor.

## 2022-12-31 NOTE — Progress Notes (Signed)
Hematology/Oncology Progress note [phone] [phone]         Patient Care Team: Jasmine Foster, MD as PCP - General (Family Medicine) Jasmine Merritts, MD as PCP - Cardiology (Cardiology) Jasmine Robson, MD as Referring Physician (Ophthalmology) Jasmine Webster, DPM as Consulting Physician (Podiatry) Jasmine Webster, Jasmine November, MD as Consulting Physician (Cardiology)    CHIEF COMPLAINTS/REASON FOR VISIT:  Anemia  ASSESSMENT & PLAN:  Iron deficiency anemia Labs are reviewed and discussed with patient. Both hemoglobin and iron panel have improved. No need for additional IV Venofer treatment today.  Continue oral iron supplementation.   Anemia in chronic kidney disease (CKD) anemia secondary to chronic kidney disease, hemoglobin is above 10.  No need for erythropoietin therapy. Continue oral iron supplementation.  Melena Recurrent GI bleeding requires blood transfusion. 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   Currently her melena has completely resolved.  Continue monitor.  Orders Placed This Encounter  Procedures   CBC with Differential/Platelet    Standing Status:   Future    Standing Expiration Date:   01/01/2024   Iron and TIBC    Standing Status:   Future    Standing Expiration Date:   01/01/2024   Ferritin    Standing Status:   Future    Standing Expiration Date:   01/01/2024   Retic Panel    Standing Status:   Future    Standing Expiration Date:   01/01/2024   Follow-up -ER evaluation.  All questions were answered. The patient knows to call the clinic with any problems, questions or concerns.  Earlie Server, MD, PhD [facility] Hematology Oncology 12/31/2022     HISTORY OF PRESENTING ILLNESS:  Jasmine Webster is a  89 y.o.  female with PMH listed below who was referred to me for anemia Reviewed patient's recent labs that was done.   Reviewed patient's previous labs ordered by primary care physician's office, anemia is  chronic onset , duration is since January 2023. Patient reports very fatigued, lightheaded, shortness of breath with minimal exertion. She denies recent chest pain on exertion, pre-syncopal episodes, or palpitations She had not noticed any recent bleeding such as epistaxis, hematuria or hematochezia.  +Dark stool She denies over the counter NSAID ingestion. She is on antiplatelets agent-aspirin 81 mg. She denies any pica and eats a variety of diet.  She was hospitalized from 05/21/2022 - 05/24/2022, due to NSTEMI and acute CHF exacerbation, symptomatic anemia, status post 2 unit of PRBC transfusion.  Patient reports feeling better after blood transfusion. Cardiology recommends no invasive intervention during this admission.  There is plan for possible TAVR   INTERVAL HISTORY Jasmine Webster is a 89 y.o. female who has above history reviewed by me today presents for acute visit for iron deficiency anemia, follow up after recent hospitalization.  Patient is accompanied by her niece.  Fatigue has improved.  Melena has resolved.  MEDICAL HISTORY:  Past Medical History:  Diagnosis Date   Aortic stenosis    severe   Arthritis    Cataract    cataract removal bilaterally approx 20 years ago   Chronic back pain    Coronary artery disease    COVID-19 virus infection 12/2020   Diabetes mellitus without complication (HCC)    Heart murmur    Hyperlipidemia    Hypertension     SURGICAL HISTORY: Past Surgical History:  Procedure Laterality Date   ABDOMINAL HYSTERECTOMY     APPENDECTOMY     BACK SURGERY  three   CARDIAC CATHETERIZATION     6+yrs no stents Dr. Verneda Skill   CATARACT EXTRACTION     RIGHT/LEFT HEART CATH AND CORONARY ANGIOGRAPHY N/A 04/26/2022   Procedure: RIGHT/LEFT HEART CATH AND CORONARY ANGIOGRAPHY;  Surgeon: Wellington Hampshire, MD;  Location: [facility];  Service: Cardiovascular;  Laterality: N/A;   THORACOLUMBAR SYRINGO SHUNT     TONSILLECTOMY AND  ADENOIDECTOMY      SOCIAL HISTORY: Social History   Socioeconomic History   Marital status: Widowed    Spouse name: Not on file   Number of children: 2   Years of education: Not on file   Highest education level: 8th grade  Occupational History   Occupation: retired  Tobacco Use   Smoking status: Never   Smokeless tobacco: Never  Vaping Use   Vaping Use: Never used  Substance and Sexual Activity   Alcohol use: No    Alcohol/week: 0.0 standard drinks of alcohol   Drug use: No   Sexual activity: Not Currently  Other Topics Concern   Not on file  Social History Narrative   Lives with daughter, Katherina Right   Social Determinants of Health   Financial Resource Strain: Low Risk  (09/11/2022)   Overall Financial Resource Strain (CARDIA)    Difficulty of Paying Living Expenses: Not hard at all  Food Insecurity: No Food Insecurity (11/28/2022)   Hunger Vital Sign    Worried About Running Out of Food in the Last Year: Never true    [place] in the Last Year: Never true  Transportation Needs: No Transportation Needs (11/28/2022)   PRAPARE - Hydrologist (Medical): No    Lack of Transportation (Non-Medical): No  Physical Activity: Insufficiently Active (09/11/2022)   Exercise Vital Sign    Days of Exercise per Week: 2 days    Minutes of Exercise per Session: 20 min  Stress: No Stress Concern Present (09/11/2022)   [place]    Feeling of Stress : Not at all  Social Connections: Moderately Isolated (09/11/2022)   Social Connection and Isolation Panel [NHANES]    Frequency of Communication with Friends and Family: More than three times a week    Frequency of Social Gatherings with Friends and Family: Twice a week    Attends Religious Services: More than 4 times per year    Active Member of [employer] or Organizations: No    Attends Archivist Meetings: Never    Marital  Status: Widowed  Intimate Partner Violence: Not At Risk (11/25/2022)   Humiliation, Afraid, Rape, and Kick questionnaire    Fear of Current or Ex-Partner: No    Emotionally Abused: No    Physically Abused: No    Sexually Abused: No    FAMILY HISTORY: Family History  Problem Relation Age of Onset   Heart disease Mother        died from MI   Heart attack Mother    Heart disease Sister    Sudden death Brother        shot and beaten to death during home invasion   Stroke Sister    Sudden death Sister        MVA   Cancer Brother        died from lung cancer   Sudden death Sister        88 days old   Cancer Brother  died from lung cancer   Stroke Brother        cause of death   Diabetes Father    Stroke Father    Pneumonia Father     ALLERGIES:  is allergic to etodolac, naproxen, statins, latex, terbinafine, and terbinafine hcl.  MEDICATIONS:  Current Outpatient Medications  Medication Sig Dispense Refill   ALPRAZolam (XANAX) 0.5 MG tablet TAKE 1 TABLET BY MOUTH EVERY DAY AT BEDTIME AS NEEDED 30 tablet 4   Artificial Tear Ointment (DRY EYES OP) Apply 1 drop to eye daily.     ascorbic acid (VITAMIN C) 250 MG tablet Take 1 tablet (250 mg total) by mouth daily. 30 tablet 1   co-enzyme Q-10 30 MG capsule Take 30 mg by mouth daily.     cyanocobalamin (VITAMIN B12) 500 MCG tablet Take 1 tablet (500 mcg total) by mouth once a week. 30 tablet 1   ezetimibe (ZETIA) 10 MG tablet TAKE 1 TABLET BY MOUTH EVERY DAY 30 tablet 11   furosemide (LASIX) 20 MG tablet TAKE 1 TABLET BY MOUTH EVERY OTHER DAY 45 tablet 1   insulin glargine-yfgn (SEMGLEE, YFGN,) 100 UNIT/ML Pen INJECT 18 UNITS SUBCUTANEOUSLY 15 mL 3   Insulin Pen Needle (B-D ULTRAFINE III SHORT PEN) 31G X 8 MM MISC Use for administering glucose twice daily 200 each 6   iron polysaccharides (NIFEREX) 150 MG capsule Take 1 capsule (150 mg total) by mouth daily. 30 capsule 1   lisinopril (ZESTRIL) 10 MG tablet TAKE 1 TABLET BY  MOUTH EVERY DAY 30 tablet 11   Magnesium 400 MG CAPS Take 400 mg by mouth daily.     Multiple Vitamins-Minerals (PRESERVISION AREDS) CAPS Take 1 capsule by mouth 2 (two) times daily.      nitroGLYCERIN (NITROSTAT) 0.4 MG SL tablet Place 1 tablet (0.4 mg total) under the tongue every 5 (five) minutes as needed for chest pain. 25 tablet 3   Omega-3 Fatty Acids (FISH OIL PO) Take by mouth daily.     omeprazole (PRILOSEC) 40 MG capsule Take 40 mg by mouth daily.     ONETOUCH ULTRA test strip CHECK SUGAR TWICE A DAY 200 strip 3   pantoprazole (PROTONIX) 40 MG tablet Take 1 tablet (40 mg total) by mouth 2 (two) times daily before a meal. 60 tablet 3   potassium chloride (KLOR-CON) 10 MEQ tablet Take 1 tablet (10 mEq total) by mouth as needed. Take with furosemide 30 tablet 1   Turmeric 500 MG CAPS Take by mouth daily.     No current facility-administered medications for this visit.   Facility-Administered Medications Ordered in Other Visits  Medication Dose Route Frequency Provider Last Rate Last Admin   0.9 %  sodium chloride infusion   Intravenous Continuous Earlie Server, MD 20 mL/hr at 12/11/22 1458 New Bag at 12/11/22 1458    Review of Systems  Constitutional:  Positive for fatigue. Negative for chills and fever.  HENT:   Negative for hearing loss and voice change.   Eyes:  Negative for eye problems.  Respiratory:  Negative for chest tightness, cough and shortness of breath.   Cardiovascular:  Negative for chest pain.  Gastrointestinal:  Negative for abdominal distention and abdominal pain.       Dark stool  Endocrine: Negative for hot flashes.  Genitourinary:  Negative for difficulty urinating and frequency.   Musculoskeletal:  Negative for arthralgias.  Skin:  Negative for itching and rash.  Neurological:  Negative for extremity weakness and light-headedness.  Hematological:  Negative for adenopathy.  Psychiatric/Behavioral:  Negative for confusion.     PHYSICAL EXAMINATION: ECOG  PERFORMANCE STATUS: 2 - Symptomatic, <50% confined to bed Vitals:   12/31/22 1416  BP: (!) 143/48  Pulse: 73  Temp: 97.7 F (36.5 C)  SpO2: 99%   Physical Exam Constitutional:      General: She is not in acute distress.    Comments: Patient sits in a wheelchair  HENT:     Head: Normocephalic and atraumatic.  Eyes:     General: No scleral icterus. Cardiovascular:     Rate and Rhythm: Normal rate and regular rhythm.     Heart sounds: Murmur heard.  Pulmonary:     Effort: Pulmonary effort is normal. No respiratory distress.     Breath sounds: No wheezing.  Abdominal:     General: Bowel sounds are normal. There is no distension.     Palpations: Abdomen is soft.  Musculoskeletal:        General: No deformity. Normal range of motion.     Cervical back: Normal range of motion and neck supple.     Comments: Trace edema bilaterally  Skin:    General: Skin is warm and dry.     Coloration: Skin is pale.     Findings: No erythema or rash.  Neurological:     Mental Status: She is alert and oriented to person, place, and time. Mental status is at baseline.     Cranial Nerves: No cranial nerve deficit.  Psychiatric:        Mood and Affect: Mood normal.      LABORATORY DATA:  I have reviewed the data as listed    Latest Ref Rng & Units 12/23/2022   11:17 AM 12/06/2022    2:59 PM 11/29/2022   10:48 AM  CBC  WBC 4.0 - 10.5 K/uL 4.4  6.5  5.8   Hemoglobin 12.0 - 15.0 g/dL 10.6  10.2  10.1   Hematocrit 36.0 - 46.0 % 32.4  30.6  30.9   Platelets 150 - 400 K/uL 174  255  210       Latest Ref Rng & Units 11/27/2022    3:17 AM 11/25/2022   12:06 PM 11/20/2022   11:20 AM  CMP  Glucose 70 - 99 mg/dL 111  235  178   BUN 8 - 23 mg/dL 18  32  36   Creatinine 0.44 - 1.00 mg/dL 0.94  1.18  1.09   Sodium 135 - 145 mmol/L 137  135  134   Potassium 3.5 - 5.1 mmol/L 3.8  4.3  5.1   Chloride 98 - 111 mmol/L 107  104  103   CO2 22 - 32 mmol/L 25  21  25   $ Calcium 8.9 - 10.3 mg/dL 8.2  8.5  8.4    Total Protein 6.5 - 8.1 g/dL  5.7  6.1   Total Bilirubin 0.3 - 1.2 mg/dL  0.8  0.9   Alkaline Phos 38 - 126 U/L  77  96   AST 15 - 41 U/L  21  20   ALT 0 - 44 U/L  13  15       Component Value Date/Time   IRON 129 12/23/2022 1117   IRON 36 12/04/2021 1302   TIBC 281 12/23/2022 1117   TIBC 327 12/04/2021 1302   FERRITIN 193 12/23/2022 1117   FERRITIN 115 10/02/2022 1305   IRONPCTSAT 46 (H) 12/23/2022 1117   IRONPCTSAT 11 (  L) 12/04/2021 1302     RADIOGRAPHIC STUDIES: I have personally reviewed the radiological images as listed and agreed with the findings in the report. No results found.

## 2023-01-18 ENCOUNTER — Other Ambulatory Visit: Payer: Self-pay | Admitting: Family Medicine

## 2023-01-21 ENCOUNTER — Ambulatory Visit: Payer: PPO | Admitting: Podiatry

## 2023-01-21 ENCOUNTER — Encounter: Payer: Self-pay | Admitting: Podiatry

## 2023-01-21 DIAGNOSIS — R234 Changes in skin texture: Secondary | ICD-10-CM

## 2023-01-21 MED ORDER — SILVER SULFADIAZINE 1 % EX CREA: 1.0000 | TOPICAL_CREAM | Freq: Every day | CUTANEOUS | 0 refills | Status: DC

## 2023-01-22 NOTE — Progress Notes (Signed)
Chief Complaint  Patient presents with   Foot Pain    "I have a split on the back of my heel.  I'm Diabetic." N - skin fissure L - right heel posterior and plantar D - over 1 month O - suddenly C - real sore A - step on it T - vaseline, cracked heel otc,     HPI: 89 y.o. female presenting today as a new patient for evaluation of a "split" on the back of her heel that has been present for over a month.  Patient states that it is very sore to walk on.  She does admit to going barefoot around the house.  She has been applying Vaseline with no improvement.  She presents for further treatment and evaluation  Past Medical History:  Diagnosis Date   Aortic stenosis    severe   Arthritis    Cataract    cataract removal bilaterally approx 20 years ago   Chronic back pain    Coronary artery disease    COVID-19 virus infection 12/2020   Diabetes mellitus without complication (HCC)    Heart murmur    Hyperlipidemia    Hypertension     Past Surgical History:  Procedure Laterality Date   ABDOMINAL HYSTERECTOMY     APPENDECTOMY     BACK SURGERY     three   CARDIAC CATHETERIZATION     6+yrs no stents Dr. Verneda Skill   CATARACT EXTRACTION     RIGHT/LEFT HEART CATH AND CORONARY ANGIOGRAPHY N/A 04/26/2022   Procedure: RIGHT/LEFT HEART CATH AND CORONARY ANGIOGRAPHY;  Surgeon: Wellington Hampshire, MD;  Location: [facility];  Service: Cardiovascular;  Laterality: N/A;   THORACOLUMBAR SYRINGO SHUNT     TONSILLECTOMY AND ADENOIDECTOMY      Allergies  Allergen Reactions   Etodolac Nausea Only, Other (See Comments) and Anxiety    Dizziness, too   Naproxen Other (See Comments) and Anxiety    Gave her an ulcer after taking it for years   Statins Other (See Comments)    No energy and myalgias   Latex Rash   Terbinafine Other (See Comments) and Rash    "Maybe made me nervous"   Terbinafine Hcl Rash     RT heel 01/21/2023  Physical Exam: General: The patient is alert and  oriented x3 in no acute distress.  Dermatology: Skin is warm, dry and supple bilateral lower extremities.  Superficial fissure noted along the posterior aspect of the heel.  It does not appear to extend into the deeper subcutaneous tissue layers of the foot.  Clinically there is no concern for underlying infection or acute cellulitis.  No drainage.  It appears very superficial and stable.  Please see above noted photo  Vascular: Palpable pedal pulses bilaterally. Capillary refill within normal limits.  Negative for any significant edema or erythema  Neurological: Light touch and protective threshold grossly intact  Musculoskeletal Exam: No pedal deformities noted  Assessment: 1.  Superficial fissure posterior heel  -Patient evaluated -The fissure to the posterior heel is very superficial and stable with good potential for healing.  Currently the patient admits to going barefoot around the house.  Advised against this.  Recommend good supportive socks and shoes at all times -Prescription for Silvadene cream.  Apply 2 times daily under occlusion with a Band-Aid -Return to clinic 3 weeks     Edrick Kins, DPM [facility]  Dr. Edrick Kins, DPM  [address], [place] [address]                Office [phone]  Fax [phone]

## 2023-01-31 ENCOUNTER — Ambulatory Visit: Payer: PPO | Admitting: Podiatry

## 2023-02-20 ENCOUNTER — Encounter: Payer: Self-pay | Admitting: Oncology

## 2023-02-20 ENCOUNTER — Ambulatory Visit: Payer: PPO | Admitting: Podiatry

## 2023-02-20 ENCOUNTER — Encounter: Payer: Self-pay | Admitting: Podiatry

## 2023-02-20 DIAGNOSIS — B351 Tinea unguium: Secondary | ICD-10-CM | POA: Diagnosis not present

## 2023-02-20 DIAGNOSIS — E119 Type 2 diabetes mellitus without complications: Secondary | ICD-10-CM | POA: Diagnosis not present

## 2023-02-20 DIAGNOSIS — M79675 Pain in left toe(s): Secondary | ICD-10-CM

## 2023-02-20 DIAGNOSIS — M79674 Pain in right toe(s): Secondary | ICD-10-CM

## 2023-02-20 NOTE — Progress Notes (Signed)
This patient returns to my office for at risk foot care.  This patient requires this care by a professional since this patient will be at risk due to having diabetes  This patient is unable to cut nails herself since the patient cannot reach her nails.These nails are painful walking and wearing shoes.  This patient presents for at risk foot care today.  General Appearance  Alert, conversant and in no acute stress.  Vascular  Dorsalis pedis and posterior tibial  pulses are weakly  palpable  bilaterally.  Capillary return is within normal limits  bilaterally. Temperature is within normal limits  bilaterally.  Neurologic  Senn-Weinstein monofilament wire test within normal limits  bilaterally. Muscle power within normal limits bilaterally.  Nails Thick disfigured discolored nails with subungual debris  from hallux to fifth toes bilaterally except nail plate left hallux.. No evidence of bacterial infection or drainage bilaterally.  Orthopedic  No limitations of motion  feet .  No crepitus or effusions noted.  HAV  B/L.  Hammer toes second  B/L.  Skin  normotropic skin with no porokeratosis noted bilaterally.  No signs of infections or ulcers noted.     Onychomycosis  Pain in right toes  Pain in left toes  Consent was obtained for treatment procedures.   Mechanical debridement of nails 1-5  bilaterally performed with a nail nipper.  Filed with dremel without incident.    Return office visit   4 months                   Told patient to return for periodic foot care and evaluation due to potential at risk complications.   Shawnique Mariotti DPM  

## 2023-02-26 ENCOUNTER — Ambulatory Visit: Payer: PPO | Admitting: Cardiovascular Disease

## 2023-03-06 ENCOUNTER — Telehealth: Payer: Self-pay | Admitting: Cardiovascular Disease

## 2023-03-06 NOTE — Telephone Encounter (Signed)
Returned call to patient. She states she is having difficulty walking through her apartment, her legs feel like they're about to "give out" and she gets a little short of breath when walking. She states this has been ongoing since she was hospitalized for anemia in January this year.   Labs last drawn in February with Hgb 10.6, ferritin 193, iron 129, TIBC 281, saturation ratios 46, UIBC 152.  Patient is concerned that these symptoms are caused by her not taking aspirin and that her blood isn't thin enough and causing her heart to have to work harder. ASA discontinued during hospitalization in January due to melena/anemia.  Patient would like to know if she should go back to taking ASA  QD.   Patient has appt with Eula Listen, PA-C on 03/12/23.  Will forward to [employer], PA-C to review and advise.

## 2023-03-06 NOTE — Telephone Encounter (Signed)
Pt states she is unable to walk across her apartment with "giving out". She states her legs give out and she gets a little SOB. She states they took her off of aspirin while she was in the hospital in January and she wants to know if she should start back on it. Please advise.

## 2023-03-06 NOTE — Telephone Encounter (Signed)
Returned call to patient and discussed Eula Listen, PA-C's response:  Being taken off of aspirin is not causing the symptoms. Symptoms are likely multifactorial including severe aortic stenosis with concern for possible progressive anemia along with pulmonary venous hypertension. Unable to provide further assessment via phone. Hemoglobin has not been checked since January. Would recommend patient be evaluated in person either in the office, with PCP, urgent care, or in the ED.   Patient states she is not going to the ED. Offered next available appointment with Nicolasa Ducking, NP for tomorrow 03/07/23 afternoon. Patient took appointment and will see provider tomorrow.

## 2023-03-06 NOTE — Telephone Encounter (Signed)
Being taken off of aspirin is not causing the symptoms.  Symptoms are likely multifactorial including severe aortic stenosis with concern for possible progressive anemia along with pulmonary venous hypertension.  Unable to provide further assessment via phone.  Hemoglobin has not been checked since January.  Would recommend patient be evaluated in person either in the office, with PCP, urgent care, or in the ED.

## 2023-03-07 ENCOUNTER — Other Ambulatory Visit
Admission: RE | Admit: 2023-03-07 | Discharge: 2023-03-07 | Disposition: A | Discharge status: Home / Self Care | Payer: PPO | Source: Ambulatory Visit | Attending: Nurse Practitioner | Admitting: Nurse Practitioner

## 2023-03-07 ENCOUNTER — Encounter: Payer: Self-pay | Admitting: Nurse Practitioner

## 2023-03-07 ENCOUNTER — Encounter: Payer: Self-pay | Admitting: Oncology

## 2023-03-07 ENCOUNTER — Ambulatory Visit: Payer: PPO | Attending: Nurse Practitioner | Admitting: Nurse Practitioner

## 2023-03-07 VITALS — BP 122/58 | HR 69 | Ht 64.0 in | Wt 150.0 lb

## 2023-03-07 DIAGNOSIS — I272 Pulmonary hypertension, unspecified: Secondary | ICD-10-CM

## 2023-03-07 DIAGNOSIS — E118 Type 2 diabetes mellitus with unspecified complications: Secondary | ICD-10-CM

## 2023-03-07 DIAGNOSIS — I2510 Atherosclerotic heart disease of native coronary artery without angina pectoris: Secondary | ICD-10-CM

## 2023-03-07 DIAGNOSIS — Z794 Long term (current) use of insulin: Secondary | ICD-10-CM | POA: Diagnosis not present

## 2023-03-07 DIAGNOSIS — I350 Nonrheumatic aortic (valve) stenosis: Secondary | ICD-10-CM

## 2023-03-07 DIAGNOSIS — D649 Anemia, unspecified: Secondary | ICD-10-CM | POA: Diagnosis not present

## 2023-03-07 DIAGNOSIS — I10 Essential (primary) hypertension: Secondary | ICD-10-CM

## 2023-03-07 DIAGNOSIS — R0609 Other forms of dyspnea: Secondary | ICD-10-CM

## 2023-03-07 DIAGNOSIS — E782 Mixed hyperlipidemia: Secondary | ICD-10-CM

## 2023-03-07 LAB — BASIC METABOLIC PANEL
Anion gap: 8 (ref 5–15)
BUN: 18 mg/dL (ref 8–23)
CO2: 26 mmol/L (ref 22–32)
Calcium: 8.7 mg/dL — ABNORMAL LOW (ref 8.9–10.3)
Chloride: 100 mmol/L (ref 98–111)
Creatinine, Ser: 0.98 mg/dL (ref 0.44–1.00)
GFR, Estimated: 53 mL/min — ABNORMAL LOW (ref >60)
Glucose, Bld: 116 mg/dL — ABNORMAL HIGH (ref 70–99)
Potassium: 4.2 mmol/L (ref 3.5–5.1)
Sodium: 134 mmol/L — ABNORMAL LOW (ref 135–145)

## 2023-03-07 LAB — CBC
HCT: 38.8 % (ref 36.0–46.0)
Hemoglobin: 12.8 g/dL (ref 12.0–15.0)
MCH: 31.8 pg (ref 26.0–34.0)
MCHC: 33 g/dL (ref 30.0–36.0)
MCV: 96.5 fL (ref 80.0–100.0)
Platelets: 189 10*3/uL (ref 150–400)
RBC: 4.02 MIL/uL (ref 3.87–5.11)
RDW: 12.9 % (ref 11.5–15.5)
WBC: 7.2 10*3/uL (ref 4.0–10.5)
nRBC: 0 % (ref 0.0–0.2)

## 2023-03-07 NOTE — Patient Instructions (Signed)
Medication Instructions:  No changes at this time.   *If you need a refill on your cardiac medications before your next appointment, please call your pharmacy*   Lab Work: CBC & BMET today over at the [facility] entrance and check in at registration desk to check in.   If you have labs (blood work) drawn today and your tests are completely normal, you will receive your results only by: MyChart Message (if you have MyChart) OR A paper copy in the mail If you have any lab test that is abnormal or we need to change your treatment, we will call you to review the results.   Testing/Procedures: None   Follow-Up: At [facility], you and your health needs are our priority.  As part of our continuing mission to provide you with exceptional heart care, we have created designated Provider Care Teams.  These Care Teams include your primary Cardiologist (physician) and Advanced Practice Providers (APPs -  Physician Assistants and Nurse Practitioners) who all work together to provide you with the care you need, when you need it.  Your next appointment:   1 month(s)  Provider:   Nicolasa Ducking, NP or Eula Listen, PA-C

## 2023-03-07 NOTE — Progress Notes (Signed)
Office Visit    Patient Name: Jasmine Webster Date of Encounter: 03/07/2023  Primary Care Provider:  Ronnald Ramp, MD Primary Cardiologist:  Julien Nordmann, MD  Chief Complaint    89 year old female with a history of nonobstructive CAD, severe aortic stenosis, symptomatic iron deficiency anemia, hypertension, hyperlipidemia, diabetes, and pulmonary venous hypertension, who presents for follow-up related to DOE.  Past Medical History    Past Medical History:  Diagnosis Date   Arthritis    Cataract    cataract removal bilaterally approx 20 years ago   Chronic back pain    COVID-19 virus infection 12/2020   Diabetes mellitus without complication    GI bleed    Hyperlipidemia    Hypertension    Iron deficiency anemia    Non-obstructive CAD (coronary artery disease)    a. 04/2022 Cath: LM 10ost, LAD 30p, RI 60, LCX min irregs, LPAV min irregs, RCA min irregs.   Severe aortic stenosis    a. 05/2022 Echo: EF 55-60%, no rwma, mild LVH, GrII DD, nl RV fxn, mild MR, mild-mod MS, Sev AS (mean grad 33, peak grad 62, AVA 0.6cm^2).   Past Surgical History:  Procedure Laterality Date   ABDOMINAL HYSTERECTOMY     APPENDECTOMY     BACK SURGERY     three   CARDIAC CATHETERIZATION     6+yrs no stents Dr. Vanita Panda   CATARACT EXTRACTION     RIGHT/LEFT HEART CATH AND CORONARY ANGIOGRAPHY N/A 04/26/2022   Procedure: RIGHT/LEFT HEART CATH AND CORONARY ANGIOGRAPHY;  Surgeon: Iran Ouch, MD;  Location: [facility] INVASIVE [facility];  Service: Cardiovascular;  Laterality: N/A;   THORACOLUMBAR SYRINGO SHUNT     TONSILLECTOMY AND ADENOIDECTOMY      Allergies  Allergies  Allergen Reactions   Etodolac Nausea Only, Other (See Comments) and Anxiety    Dizziness, too   Naproxen Other (See Comments) and Anxiety    Gave her an ulcer after taking it for years   Statins Other (See Comments)    No energy and myalgias   Latex Rash   Terbinafine Other (See Comments) and Rash     "Maybe made me nervous"   Terbinafine Hcl Rash    History of Present Illness    89 year old female with above past medical history including nonobstructive CAD, severe aortic stenosis, symptomatic iron deficiency anemia, hypertension, hyperlipidemia, diabetes, and pulmonary venous hypertension.  She has been followed for several years secondary to aortic valve disease.  In June 2023, complained of chest tightness and dyspnea exertion.  Right and left heart cardiac catheterization showed moderate, nonobstructive CAD with severe pulmonary hypertension and normal cardiac output.  Prominent V waves on pulmonary which tracing suggested but regurgitation.  There was severe aortic stenosis with a mean gradient of 40 mmHg.  She was evaluated by the structural heart team in June 2023 with plan for further TAVR evaluation.  In July 2023, she was admitted with symptomatic anemia and hemoglobin of 6, requiring 2 units of packed red blood cells.  Admission was complicated by demand ischemia with troponin of 4000.  Following discharge, she completed TAVR evaluation and was not felt to have suitable anatomy for TAVR.  She also underwent GI evaluation with video capsule endoscopy showing no significant findings.  EGD and colonoscopy were deferred in the setting of comorbid conditions.  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  Aspirin therapy was discontinued.  Jasmine Webster was last  seen in cardiology clinic in January 2024, at which time she reported an increase in dark stools with fatigue.  Lab work showed stable H&H and on multiple repeat evaluations, H&H is largely trended in the 10/32 range, most recently measured on December 23, 2022.  Jasmine Webster contacted our office on April 18 secondary to progressive dyspnea on exertion.  Over the past 2 days, she has noted dyspnea w/ minimal activity.  She also reports that she stopped taking Lasix earlier this week just to see how she might feel.   She has not had any chest pain, presyncope, syncope, palpitations, PND, orthopnea, or early satiety.  Home Medications    Current Outpatient Medications  Medication Sig Dispense Refill   ALPRAZolam (XANAX) 0.5 MG tablet TAKE 1 TABLET BY MOUTH EVERY DAY AT BEDTIME AS NEEDED 30 tablet 4   Artificial Tear Ointment (DRY EYES OP) Apply 1 drop to eye daily.     ascorbic acid (VITAMIN C) 250 MG tablet Take 1 tablet (250 mg total) by mouth daily. 30 tablet 1   co-enzyme Q-10 30 MG capsule Take 30 mg by mouth daily.     cyanocobalamin (VITAMIN B12) 500 MCG tablet Take 1 tablet (500 mcg total) by mouth once a week. 30 tablet 1   ezetimibe (ZETIA) 10 MG tablet TAKE 1 TABLET BY MOUTH EVERY DAY 30 tablet 11   insulin glargine-yfgn (SEMGLEE, YFGN,) 100 UNIT/ML Pen INJECT 18 UNITS SUBCUTANEOUSLY 15 mL 3   iron polysaccharides (NIFEREX) 150 MG capsule Take 1 capsule (150 mg total) by mouth daily. 30 capsule 1   lisinopril (ZESTRIL) 10 MG tablet TAKE 1 TABLET BY MOUTH EVERY DAY 30 tablet 11   Magnesium 400 MG CAPS Take 400 mg by mouth daily.     Multiple Vitamins-Minerals (PRESERVISION AREDS) CAPS Take 1 capsule by mouth 2 (two) times daily.      nitroGLYCERIN (NITROSTAT) 0.4 MG SL tablet Place 1 tablet (0.4 mg total) under the tongue every 5 (five) minutes as needed for chest pain. 25 tablet 3   Omega-3 Fatty Acids (FISH OIL PO) Take by mouth daily.     omeprazole (PRILOSEC) 40 MG capsule Take 40 mg by mouth daily.     ONETOUCH ULTRA test strip CHECK SUGAR TWICE A DAY 200 strip 3   potassium chloride (KLOR-CON) 10 MEQ tablet TAKE 1 TABLET (10 MEQ TOTAL) BY MOUTH DAILY AS NEEDED. TAKE WITH FUROSEMIDE 90 tablet 1   silver sulfADIAZINE (SILVADENE) 1 % cream Apply 1 Application topically daily. 50 g 0   Turmeric 500 MG CAPS Take by mouth daily.     furosemide (LASIX) 20 MG tablet TAKE 1 TABLET BY MOUTH EVERY OTHER DAY (Patient not taking: Reported on 03/07/2023) 45 tablet 1   Insulin Pen Needle (B-D ULTRAFINE  III SHORT PEN) 31G X 8 MM MISC Use for administering glucose twice daily (Patient not taking: Reported on 03/07/2023) 200 each 6   pantoprazole (PROTONIX) 40 MG tablet Take 1 tablet (40 mg total) by mouth 2 (two) times daily before a meal. (Patient not taking: Reported on 03/07/2023) 60 tablet 3   No current facility-administered medications for this visit.   Facility-Administered Medications Ordered in Other Visits  Medication Dose Route Frequency Provider Last Rate Last Admin   0.9 %  sodium chloride infusion   Intravenous Continuous Rickard Patience, MD 20 mL/hr at 12/11/22 1458 New Bag at 12/11/22 1458     Review of Systems    2 day h/o DOE.  She  denies chest pain, palpitations, pnd, orthopnea, n, v, dizziness, syncope, edema, weight gain, or early satiety.  All other systems reviewed and are otherwise negative except as noted above.    Physical Exam    VS:  BP (!) 122/58 (BP Location: Left Arm, Patient Position: Sitting, Cuff Size: Normal)   Pulse 69   Ht 5\' 4"  (1.626 m)   Wt 150 lb (68 kg)   SpO2 98%   BMI 25.75 kg/m  , BMI Body mass index is 25.75 kg/m.     GEN: Well nourished, well developed, in no acute distress. HEENT: normal. Neck: Supple, no JVD, carotid bruits, or masses. Cardiac: RRR, 3/6 Syst murmur loudest at the upper sternal borders but heard throughout.  No rubs or gallops. No clubbing, cyanosis, edema.  Radials 2+/PT 2+ and equal bilaterally.  Respiratory:  Respirations regular and unlabored, clear to auscultation bilaterally. GI: Soft, nontender, nondistended, BS + x 4. MS: no deformity or atrophy. Skin: warm and dry, no rash. Neuro:  Strength and sensation are intact. Psych: Normal affect.  Accessory Clinical Findings    ECG personally reviewed by me today -sinus arrhythmia, left axis deviation, left bundle branch block- no acute changes.  Lab Results  Component Value Date   WBC 7.2 03/07/2023   HGB 12.8 03/07/2023   HCT 38.8 03/07/2023   MCV 96.5 03/07/2023    PLT 189 03/07/2023   Lab Results  Component Value Date   CREATININE 0.98 03/07/2023   BUN 18 03/07/2023   NA 134 (L) 03/07/2023   K 4.2 03/07/2023   CL 100 03/07/2023   CO2 26 03/07/2023   Lab Results  Component Value Date   ALT 13 11/25/2022   AST 21 11/25/2022   ALKPHOS 77 11/25/2022   BILITOT 0.8 11/25/2022   Lab Results  Component Value Date   CHOL 230 (H) 10/02/2022   HDL 46 10/02/2022   LDLCALC 141 (H) 10/02/2022   TRIG 237 (H) 10/02/2022   CHOLHDL 5.0 (H) 10/02/2022    Lab Results  Component Value Date   HGBA1C 6.4 (H) 10/02/2022    Assessment & Plan    1.  Severe aortic stenosis/dyspnea on exertion: Patient presents with 2-day history of progressive dyspnea on exertion in the setting of known severe aortic stenosis with moderate mitral stenosis and mitral regurgitation as well.  She has a history of GI bleed and labs today show normal H&H with stable renal function.  She indicates that she stopped taking her Lasix earlier this week just to see how she would feel.  Although she does not look significantly volume overloaded on examination, I suspect her symptoms are likely driven by at least mild volume overload, she has limited reserve in the setting of severe valvular heart disease.  I encouraged her to resume her Lasix 20 mg 3 times a week as she was previously using.  I also discussed the natural progression in symptomatology related to severe aortic stenosis with her and her family member today.  They understand that she is likely to experience progressive dyspnea exertion and reduced activity tolerance going forward.  2.  Pulmonary hypertension: Likely playing a role in current symptoms in the setting of holding Lasix.  Resume.  3.  Nonobstructive CAD: 60% ramus intermedius stenosis by catheterization in June 2023 with otherwise mild to moderate disease.  She has not been having any chest pain.  She remains on Zetia in the setting of statin intolerance.  No aspirin  secondary to history of  GI bleed.  4.  Essential hypertension: Stable on low-dose ACE inhibitor therapy.  5.  Hyperlipidemia: LDL of 141 last November.  She is statin intolerant and remains on ezetimibe.  6.  Iron deficiency anemia: Status post GI workup last summer.  In the setting of dyspnea, we obtained lab work today and H&H is normal.  7.  Type 2 diabetes mellitus: A1c 6.4 November.  Managed by primary care.  8.  Disposition: Offered patient follow-up next week to see how she does follow resumption of Lasix that she would prefer to follow-up in 1 month instead.  Nicolasa Ducking, NP 03/07/2023, 3:45 PM

## 2023-03-12 ENCOUNTER — Ambulatory Visit: Payer: PPO | Admitting: Physician Assistant

## 2023-03-27 ENCOUNTER — Other Ambulatory Visit: Payer: Self-pay | Admitting: Podiatry

## 2023-04-03 ENCOUNTER — Ambulatory Visit (INDEPENDENT_AMBULATORY_CARE_PROVIDER_SITE_OTHER): Payer: PPO | Admitting: Family Medicine

## 2023-04-03 ENCOUNTER — Encounter: Payer: Self-pay | Admitting: Family Medicine

## 2023-04-03 VITALS — BP 148/67 | HR 70 | Temp 98.4°F | Resp 16 | Wt 153.8 lb

## 2023-04-03 DIAGNOSIS — E1122 Type 2 diabetes mellitus with diabetic chronic kidney disease: Secondary | ICD-10-CM

## 2023-04-03 DIAGNOSIS — Z794 Long term (current) use of insulin: Secondary | ICD-10-CM

## 2023-04-03 DIAGNOSIS — I10 Essential (primary) hypertension: Secondary | ICD-10-CM

## 2023-04-03 DIAGNOSIS — N1832 Chronic kidney disease, stage 3b: Secondary | ICD-10-CM | POA: Diagnosis not present

## 2023-04-03 DIAGNOSIS — E119 Type 2 diabetes mellitus without complications: Secondary | ICD-10-CM | POA: Diagnosis not present

## 2023-04-03 LAB — POCT GLYCOSYLATED HEMOGLOBIN (HGB A1C)
Est. average glucose Bld gHb Est-mCnc: 157
Hemoglobin A1C: 7.1 % — AB (ref 4.0–5.6)

## 2023-04-03 MED ORDER — INSULIN GLARGINE-YFGN 100 UNIT/ML ~~LOC~~ SOPN
PEN_INJECTOR | SUBCUTANEOUS | 3 refills | Status: DC
Start: 2023-04-03 — End: 2023-09-17

## 2023-04-03 NOTE — Assessment & Plan Note (Signed)
Controlled BP at goal Continue current medications at current doses No medications changes today   

## 2023-04-03 NOTE — Assessment & Plan Note (Addendum)
DM well controlled on current therapies, A1c at goal of less than 8  Repeat Hgb A1c today, 7.1 Decreased Semglee to 15 units daily from 18 due to report of hypoglycemia and advanced age   DM Health Maintenance Patient on Zetia  Patient on ACE/ARB, lisinopril  Patient requests to no longer be screened for eye exams due to current conditions

## 2023-04-03 NOTE — Progress Notes (Signed)
I,[name],acting as a scribe for [employer], MD.,have documented all relevant documentation on the behalf of Ronnald Ramp, MD,as directed by  Ronnald Ramp, MD while in the presence of Ronnald Ramp, MD.   Established patient visit   Patient: Jasmine Webster   DOB: 22-Apr-1926   89 y.o. Female  MRN: [id number] Visit Date: 04/03/2023  Today's healthcare provider: Ronnald Ramp, MD   Chief Complaint  Patient presents with   Follow-Up chronic disease   Subjective    HPI  Hypertension, follow-up  BP Readings from Last 3 Encounters:  04/03/23 (!) 148/67  03/07/23 (!) 122/58  12/31/22 (!) 143/48   Wt Readings from Last 3 Encounters:  04/03/23 153 lb 12.8 oz (69.8 kg)  03/07/23 150 lb (68 kg)  12/31/22 151 lb 12.8 oz (68.9 kg)     She was last seen for hypertension 1 months ago.  BP at that visit was 122/58. Management since that visit includes continue lisinopril 10mg  daily .  She reports excellent compliance with treatment.  She does not smoke.   Outside blood pressures are not being checked. Symptoms: No chest pain No chest pressure  No palpitations No syncope  No dyspnea No orthopnea  No paroxysmal nocturnal dyspnea No lower extremity edema   Pertinent labs Lab Results  Component Value Date   CHOL 230 (H) 10/02/2022   HDL 46 10/02/2022   LDLCALC 141 (H) 10/02/2022   TRIG 237 (H) 10/02/2022   CHOLHDL 5.0 (H) 10/02/2022   Lab Results  Component Value Date   NA 134 (L) 03/07/2023   K 4.2 03/07/2023   CREATININE 0.98 03/07/2023   GFRNONAA 53 (L) 03/07/2023   GLUCOSE 116 (H) 03/07/2023   TSH 2.510 10/02/2022     The ASCVD Risk score (Arnett DK, et al., 2019) failed to calculate for the following reasons:   The 2019 ASCVD risk score is only valid for ages 21 to 37   The patient has a prior MI or stroke  diagnosis  --------------------------------------------------------------------------------------------------- Diabetes Mellitus Type II, Follow-up  Lab Results  Component Value Date   HGBA1C 7.1 (A) 04/03/2023   HGBA1C 6.4 (H) 10/02/2022   HGBA1C 7.4 (A) 04/11/2022   Wt Readings from Last 3 Encounters:  04/03/23 153 lb 12.8 oz (69.8 kg)  03/07/23 150 lb (68 kg)  12/31/22 151 lb 12.8 oz (68.9 kg)   Last seen for diabetes 6 months ago.  Management since then includes semglee 18 units daily. She reports excellent compliance with treatment. She is not having side effects. Symptoms: No fatigue No foot ulcerations  No appetite changes No nausea  No paresthesia of the feet  No polydipsia  No polyuria No visual disturbances   No vomiting     Home blood sugar records: fasting range: 110-112 for the last month. Today it was 130 Episodes of hypoglycemia? 70 two weeks ago, reports she was symptomatic    Current insulin regiment: semglee 18 units Most Recent Eye Exam: not UTD.   Pertinent Labs: Lab Results  Component Value Date   CHOL 230 (H) 10/02/2022   HDL 46 10/02/2022   LDLCALC 141 (H) 10/02/2022   TRIG 237 (H) 10/02/2022   CHOLHDL 5.0 (H) 10/02/2022   Lab Results  Component Value Date   NA 134 (L) 03/07/2023   K 4.2 03/07/2023   CREATININE 0.98 03/07/2023   GFRNONAA 53 (L) 03/07/2023     ---------------------------------------------------------------------------------------------------   Medications: Outpatient Medications Prior to Visit  Medication Sig  ALPRAZolam (XANAX) 0.5 MG tablet TAKE 1 TABLET BY MOUTH EVERY DAY AT BEDTIME AS NEEDED   Artificial Tear Ointment (DRY EYES OP) Apply 1 drop to eye daily.   ascorbic acid (VITAMIN C) 250 MG tablet Take 1 tablet (250 mg total) by mouth daily.   co-enzyme Q-10 30 MG capsule Take 30 mg by mouth daily.   cyanocobalamin (VITAMIN B12) 500 MCG tablet Take 1 tablet (500 mcg total) by mouth once a week.   ezetimibe  (ZETIA) 10 MG tablet TAKE 1 TABLET BY MOUTH EVERY DAY   Insulin Pen Needle (B-D ULTRAFINE III SHORT PEN) 31G X 8 MM MISC Use for administering glucose twice daily   iron polysaccharides (NIFEREX) 150 MG capsule Take 1 capsule (150 mg total) by mouth daily.   lisinopril (ZESTRIL) 10 MG tablet TAKE 1 TABLET BY MOUTH EVERY DAY   Magnesium 400 MG CAPS Take 400 mg by mouth daily.   Multiple Vitamins-Minerals (PRESERVISION AREDS) CAPS Take 1 capsule by mouth 2 (two) times daily.    nitroGLYCERIN (NITROSTAT) 0.4 MG SL tablet Place 1 tablet (0.4 mg total) under the tongue every 5 (five) minutes as needed for chest pain.   Omega-3 Fatty Acids (FISH OIL PO) Take by mouth daily.   omeprazole (PRILOSEC) 40 MG capsule Take 40 mg by mouth daily.   ONETOUCH ULTRA test strip CHECK SUGAR TWICE A DAY   pantoprazole (PROTONIX) 40 MG tablet Take 1 tablet (40 mg total) by mouth 2 (two) times daily before a meal.   potassium chloride (KLOR-CON) 10 MEQ tablet TAKE 1 TABLET (10 MEQ TOTAL) BY MOUTH DAILY AS NEEDED. TAKE WITH FUROSEMIDE   silver sulfADIAZINE (SILVADENE) 1 % cream APPLY 1 APPLICATION TOPICALLY DAILY   Turmeric 500 MG CAPS Take by mouth daily.   [DISCONTINUED] insulin glargine-yfgn (SEMGLEE, YFGN,) 100 UNIT/ML Pen INJECT 18 UNITS SUBCUTANEOUSLY   furosemide (LASIX) 20 MG tablet TAKE 1 TABLET BY MOUTH EVERY OTHER DAY (Patient not taking: Reported on 03/07/2023)   Facility-Administered Medications Prior to Visit  Medication Dose Route Frequency Provider   0.9 %  sodium chloride infusion   Intravenous Continuous Rickard Patience, MD    Review of Systems     Objective    BP (!) 148/67 (BP Location: Left Arm, Patient Position: Sitting, Cuff Size: Normal)   Pulse 70   Temp 98.4 F (36.9 C) (Oral)   Resp 16   Wt 153 lb 12.8 oz (69.8 kg)   BMI 26.40 kg/m    Physical Exam Vitals reviewed.  Constitutional:      General: She is not in acute distress.    Appearance: Normal appearance. She is not  ill-appearing, toxic-appearing or diaphoretic.  Eyes:     Conjunctiva/sclera: Conjunctivae normal.  Cardiovascular:     Rate and Rhythm: Normal rate and regular rhythm.     Pulses: Normal pulses.     Heart sounds: Murmur heard.     No friction rub. No gallop.  Pulmonary:     Effort: Pulmonary effort is normal. No respiratory distress.     Breath sounds: Normal breath sounds. No stridor. No wheezing, rhonchi or rales.  Abdominal:     General: Bowel sounds are normal. There is no distension.     Palpations: Abdomen is soft.     Tenderness: There is no abdominal tenderness.  Musculoskeletal:     Right lower leg: No edema.     Left lower leg: No edema.  Skin:    Findings: No erythema or rash.  Neurological:     Mental Status: She is alert and oriented to person, place, and time.       Results for orders placed or performed in visit on 04/03/23  POCT HgB A1C  Result Value Ref Range   Hemoglobin A1C 7.1 (A) 4.0 - 5.6 %   Est. average glucose Bld gHb Est-mCnc 157     Assessment & Plan     Problem List Items Addressed This Visit       Cardiovascular and Mediastinum   Hypertension - Primary    Controlled BP at goal Continue current medications at current doses No medications changes today          Endocrine   Diabetes mellitus without complication (HCC)   Relevant Medications   insulin glargine-yfgn (SEMGLEE, YFGN,) 100 UNIT/ML Pen   Other Relevant Orders   POCT HgB A1C (Completed)   Type 2 diabetes mellitus with diabetic chronic kidney disease (HCC)    DM well controlled on current therapies, A1c at goal of less than 8  Repeat Hgb A1c today, 7.1 Decreased Semglee to 15 units daily from 18 due to report of hypoglycemia and advanced age   DM Health Maintenance Patient on Zetia  Patient on ACE/ARB, lisinopril  Patient requests to no longer be screened for eye exams due to current conditions         Relevant Medications   insulin glargine-yfgn (SEMGLEE, YFGN,)  100 UNIT/ML Pen     Return in about 6 months (around 10/04/2023) for chronic f/u .        The entirety of the information documented in the History of Present Illness, Review of Systems and Physical Exam were personally obtained by me. Portions of this information were initially documented by Hetty Ely, CMA . I, Ronnald Ramp, MD have reviewed the documentation above for thoroughness and accuracy.      Ronnald Ramp, MD  [facility] [phone] (phone) [phone] (fax)  [facility]

## 2023-04-16 ENCOUNTER — Ambulatory Visit: Payer: PPO | Admitting: Physician Assistant

## 2023-05-01 ENCOUNTER — Inpatient Hospital Stay: Payer: PPO | Attending: Oncology

## 2023-05-01 DIAGNOSIS — Z801 Family history of malignant neoplasm of trachea, bronchus and lung: Secondary | ICD-10-CM | POA: Diagnosis not present

## 2023-05-01 DIAGNOSIS — E1122 Type 2 diabetes mellitus with diabetic chronic kidney disease: Secondary | ICD-10-CM | POA: Insufficient documentation

## 2023-05-01 DIAGNOSIS — Z79899 Other long term (current) drug therapy: Secondary | ICD-10-CM | POA: Insufficient documentation

## 2023-05-01 DIAGNOSIS — D631 Anemia in chronic kidney disease: Secondary | ICD-10-CM | POA: Insufficient documentation

## 2023-05-01 DIAGNOSIS — D500 Iron deficiency anemia secondary to blood loss (chronic): Secondary | ICD-10-CM

## 2023-05-01 DIAGNOSIS — N189 Chronic kidney disease, unspecified: Secondary | ICD-10-CM | POA: Diagnosis not present

## 2023-05-01 DIAGNOSIS — Z794 Long term (current) use of insulin: Secondary | ICD-10-CM | POA: Diagnosis not present

## 2023-05-01 DIAGNOSIS — I130 Hypertensive heart and chronic kidney disease with heart failure and stage 1 through stage 4 chronic kidney disease, or unspecified chronic kidney disease: Secondary | ICD-10-CM | POA: Diagnosis not present

## 2023-05-01 DIAGNOSIS — D509 Iron deficiency anemia, unspecified: Secondary | ICD-10-CM | POA: Insufficient documentation

## 2023-05-01 LAB — CBC WITH DIFFERENTIAL/PLATELET
Abs Immature Granulocytes: 0.08 10*3/uL — ABNORMAL HIGH (ref 0.00–0.07)
Basophils Absolute: 0.1 10*3/uL (ref 0.0–0.1)
Basophils Relative: 1 %
Eosinophils Absolute: 0.1 10*3/uL (ref 0.0–0.5)
Eosinophils Relative: 1 %
HCT: 34.6 % — ABNORMAL LOW (ref 36.0–46.0)
Hemoglobin: 11.4 g/dL — ABNORMAL LOW (ref 12.0–15.0)
Immature Granulocytes: 1 %
Lymphocytes Relative: 32 %
Lymphs Abs: 1.8 10*3/uL (ref 0.7–4.0)
MCH: 32.2 pg (ref 26.0–34.0)
MCHC: 32.9 g/dL (ref 30.0–36.0)
MCV: 97.7 fL (ref 80.0–100.0)
Monocytes Absolute: 0.7 10*3/uL (ref 0.1–1.0)
Monocytes Relative: 12 %
Neutro Abs: 3 10*3/uL (ref 1.7–7.7)
Neutrophils Relative %: 53 %
Platelets: 177 10*3/uL (ref 150–400)
RBC: 3.54 MIL/uL — ABNORMAL LOW (ref 3.87–5.11)
RDW: 13.1 % (ref 11.5–15.5)
WBC: 5.7 10*3/uL (ref 4.0–10.5)
nRBC: 0 % (ref 0.0–0.2)

## 2023-05-01 LAB — RETIC PANEL
Immature Retic Fract: 14.1 % (ref 2.3–15.9)
RBC.: 3.54 MIL/uL — ABNORMAL LOW (ref 3.87–5.11)
Retic Count, Absolute: 80 10*3/uL (ref 19.0–186.0)
Retic Ct Pct: 2.3 % (ref 0.4–3.1)
Reticulocyte Hemoglobin: 35.3 pg (ref >27.9)

## 2023-05-01 LAB — IRON AND TIBC
Iron: 108 ug/dL (ref 28–170)
Saturation Ratios: 35 % — ABNORMAL HIGH (ref 10.4–31.8)
TIBC: 305 ug/dL (ref 250–450)
UIBC: 197 ug/dL

## 2023-05-01 LAB — FERRITIN: Ferritin: 113 ng/mL (ref 11–307)

## 2023-05-05 MED FILL — Iron Sucrose Inj 20 MG/ML (Fe Equiv): INTRAVENOUS | Qty: 10 | Status: AC

## 2023-05-06 ENCOUNTER — Inpatient Hospital Stay: Payer: PPO

## 2023-05-06 ENCOUNTER — Encounter: Payer: Self-pay | Admitting: Oncology

## 2023-05-06 ENCOUNTER — Inpatient Hospital Stay (HOSPITAL_BASED_OUTPATIENT_CLINIC_OR_DEPARTMENT_OTHER): Payer: PPO | Admitting: Oncology

## 2023-05-06 VITALS — BP 147/53 | HR 72 | Temp 97.5°F | Resp 18 | Wt 154.2 lb

## 2023-05-06 DIAGNOSIS — D631 Anemia in chronic kidney disease: Secondary | ICD-10-CM | POA: Diagnosis not present

## 2023-05-06 DIAGNOSIS — N183 Chronic kidney disease, stage 3 unspecified: Secondary | ICD-10-CM

## 2023-05-06 DIAGNOSIS — D509 Iron deficiency anemia, unspecified: Secondary | ICD-10-CM | POA: Diagnosis not present

## 2023-05-06 DIAGNOSIS — D500 Iron deficiency anemia secondary to blood loss (chronic): Secondary | ICD-10-CM | POA: Diagnosis not present

## 2023-05-06 MED ORDER — POLYSACCHARIDE IRON COMPLEX 150 MG PO CAPS: 150.0000 mg | ORAL_CAPSULE | ORAL | 1 refills | Status: DC

## 2023-05-06 NOTE — Progress Notes (Signed)
Hematology/Oncology Progress note [phone] [phone]         Patient Care Team: Ronnald Ramp, MD as PCP - General (Family Medicine) Antonieta Iba, MD as PCP - Cardiology (Cardiology) Galen Manila, MD as Referring Physician (Ophthalmology) Helane Gunther, DPM as Consulting Physician (Podiatry) Mariah Milling, Tollie Pizza, MD as Consulting Physician (Cardiology)    CHIEF COMPLAINTS/REASON FOR VISIT:  Anemia  ASSESSMENT & PLAN:  Iron deficiency anemia Labs are reviewed and discussed with patient. Both hemoglobin and iron panel have improved. Lab Results  Component Value Date   HGB 11.4 (L) 05/01/2023   TIBC 305 05/01/2023   IRONPCTSAT 35 (H) 05/01/2023   FERRITIN 113 05/01/2023     No need for additional IV Venofer treatment today.  Continue oral iron supplementation, Niferex 150 mg every other day.   Anemia in chronic kidney disease (CKD) anemia secondary to chronic kidney disease, hemoglobin is above 10.  No need for erythropoietin therapy. Continue oral iron supplementation.  Orders Placed This Encounter  Procedures   CBC with Differential (Cancer Center Only)    Standing Status:   Future    Standing Expiration Date:   05/05/2024   Iron and TIBC    Standing Status:   Future    Standing Expiration Date:   05/05/2024   Ferritin    Standing Status:   Future    Standing Expiration Date:   05/05/2024   Retic Panel    Standing Status:   Future    Standing Expiration Date:   05/05/2024   Follow-up 6 months. All questions were answered. The patient knows to call the clinic with any problems, questions or concerns.  Rickard Patience, MD, PhD [facility] Hematology Oncology 05/06/2023     HISTORY OF PRESENTING ILLNESS:  Jasmine Webster is a  89 y.o.  female with PMH listed below who was referred to me for anemia Reviewed patient's recent labs that was done.   Reviewed patient's previous labs ordered by primary care physician's office,  anemia is chronic onset , duration is since January 2023. Patient reports very fatigued, lightheaded, shortness of breath with minimal exertion. She denies recent chest pain on exertion, pre-syncopal episodes, or palpitations She had not noticed any recent bleeding such as epistaxis, hematuria or hematochezia.  +Dark stool She denies over the counter NSAID ingestion. She is on antiplatelets agent-aspirin 81 mg. She denies any pica and eats a variety of diet.  She was hospitalized from 05/21/2022 - 05/24/2022, due to NSTEMI and acute CHF exacerbation, symptomatic anemia, status post 2 unit of PRBC transfusion.  Patient reports feeling better after blood transfusion. Cardiology recommends no invasive intervention during this admission.  There is plan for possible TAVR   INTERVAL HISTORY Jasmine Webster is a 89 y.o. female who has above history reviewed by me today presents for acute visit for iron deficiency anemia, follow up after recent hospitalization.  Patient is accompanied by her niece.  Fatigue has improved.  Melena has resolved. She tolerated IV Venofer treatments.  MEDICAL HISTORY:  Past Medical History:  Diagnosis Date   Arthritis    Cataract    cataract removal bilaterally approx 20 years ago   Chronic back pain    COVID-19 virus infection 12/2020   Diabetes mellitus without complication (HCC)    GI bleed    Hyperlipidemia    Hypertension    Iron deficiency anemia    Non-obstructive CAD (coronary artery disease)    a. 04/2022 Cath: LM 10ost, LAD 30p,  RI 60, LCX min irregs, LPAV min irregs, RCA min irregs.   Severe aortic stenosis    a. 05/2022 Echo: EF 55-60%, no rwma, mild LVH, GrII DD, nl RV fxn, mild MR, mild-mod MS, Sev AS (mean grad 33, peak grad 62, AVA 0.6cm^2).    SURGICAL HISTORY: Past Surgical History:  Procedure Laterality Date   ABDOMINAL HYSTERECTOMY     APPENDECTOMY     BACK SURGERY     three   CARDIAC CATHETERIZATION     6+yrs no stents Dr. Vanita Panda   CATARACT EXTRACTION     RIGHT/LEFT HEART CATH AND CORONARY ANGIOGRAPHY N/A 04/26/2022   Procedure: RIGHT/LEFT HEART CATH AND CORONARY ANGIOGRAPHY;  Surgeon: Iran Ouch, MD;  Location: [facility] INVASIVE CV LAB;  Service: Cardiovascular;  Laterality: N/A;   THORACOLUMBAR SYRINGO SHUNT     TONSILLECTOMY AND ADENOIDECTOMY      SOCIAL HISTORY: Social History   Socioeconomic History   Marital status: Widowed    Spouse name: Not on file   Number of children: 2   Years of education: Not on file   Highest education level: 8th grade  Occupational History   Occupation: retired  Tobacco Use   Smoking status: Never   Smokeless tobacco: Never  Vaping Use   Vaping Use: Never used  Substance and Sexual Activity   Alcohol use: No    Alcohol/week: 0.0 standard drinks of alcohol   Drug use: No   Sexual activity: Not Currently  Other Topics Concern   Not on file  Social History Narrative   Lives with daughter, [name]   Social Determinants of Health   Financial Resource Strain: Low Risk  (09/11/2022)   Overall Financial Resource Strain (CARDIA)    Difficulty of Paying Living Expenses: Not hard at all  Food Insecurity: No Food Insecurity (11/28/2022)   Hunger Vital Sign    Worried About Running Out of Food in the Last Year: Never true    Ran Out of Food in the Last Year: Never true  Transportation Needs: No Transportation Needs (11/28/2022)   PRAPARE - Administrator, Civil Service (Medical): No    Lack of Transportation (Non-Medical): No  Physical Activity: Insufficiently Active (09/11/2022)   Exercise Vital Sign    Days of Exercise per Week: 2 days    Minutes of Exercise per Session: 20 min  Stress: No Stress Concern Present (09/11/2022)   [employer] of Occupational Health - Occupational Stress Questionnaire    Feeling of Stress : Not at all  Social Connections: Moderately Isolated (09/11/2022)   Social Connection and Isolation Panel [NHANES]     Frequency of Communication with Friends and Family: More than three times a week    Frequency of Social Gatherings with Friends and Family: Twice a week    Attends Religious Services: More than 4 times per year    Active Member of [employer] or Organizations: No    Attends Banker Meetings: Never    Marital Status: Widowed  Intimate Partner Violence: Not At Risk (11/25/2022)   Humiliation, Afraid, Rape, and Kick questionnaire    Fear of Current or Ex-Partner: No    Emotionally Abused: No    Physically Abused: No    Sexually Abused: No    FAMILY HISTORY: Family History  Problem Relation Age of Onset   Heart disease Mother        died from MI   Heart attack Mother    Heart disease Sister  Sudden death Brother        shot and beaten to death during home invasion   Stroke Sister    Sudden death Sister        MVA   Cancer Brother        died from lung cancer   Sudden death Sister        109 days old   Cancer Brother        died from lung cancer   Stroke Brother        cause of death   Diabetes Father    Stroke Father    Pneumonia Father     ALLERGIES:  is allergic to etodolac, naproxen, statins, latex, terbinafine, and terbinafine hcl.  MEDICATIONS:  Current Outpatient Medications  Medication Sig Dispense Refill   ALPRAZolam (XANAX) 0.5 MG tablet TAKE 1 TABLET BY MOUTH EVERY DAY AT BEDTIME AS NEEDED 30 tablet 4   Artificial Tear Ointment (DRY EYES OP) Apply 1 drop to eye daily.     ascorbic acid (VITAMIN C) 250 MG tablet Take 1 tablet (250 mg total) by mouth daily. 30 tablet 1   co-enzyme Q-10 30 MG capsule Take 30 mg by mouth daily.     cyanocobalamin (VITAMIN B12) 500 MCG tablet Take 1 tablet (500 mcg total) by mouth once a week. 30 tablet 1   ezetimibe (ZETIA) 10 MG tablet TAKE 1 TABLET BY MOUTH EVERY DAY 30 tablet 11   insulin glargine-yfgn (SEMGLEE, YFGN,) 100 UNIT/ML Pen INJECT 15 UNITS SUBCUTANEOUSLY 15 mL 3   Insulin Pen Needle (B-D ULTRAFINE III SHORT PEN)  31G X 8 MM MISC Use for administering glucose twice daily 200 each 6   lisinopril (ZESTRIL) 10 MG tablet TAKE 1 TABLET BY MOUTH EVERY DAY 30 tablet 11   Magnesium 400 MG CAPS Take 400 mg by mouth daily.     Multiple Vitamins-Minerals (PRESERVISION AREDS) CAPS Take 1 capsule by mouth 2 (two) times daily.      nitroGLYCERIN (NITROSTAT) 0.4 MG SL tablet Place 1 tablet (0.4 mg total) under the tongue every 5 (five) minutes as needed for chest pain. 25 tablet 3   Omega-3 Fatty Acids (FISH OIL PO) Take by mouth daily.     omeprazole (PRILOSEC) 40 MG capsule Take 40 mg by mouth daily.     ONETOUCH ULTRA test strip CHECK SUGAR TWICE A DAY 200 strip 3   pantoprazole (PROTONIX) 40 MG tablet Take 1 tablet (40 mg total) by mouth 2 (two) times daily before a meal. 60 tablet 3   potassium chloride (KLOR-CON) 10 MEQ tablet TAKE 1 TABLET (10 MEQ TOTAL) BY MOUTH DAILY AS NEEDED. TAKE WITH FUROSEMIDE 90 tablet 1   silver sulfADIAZINE (SILVADENE) 1 % cream APPLY 1 APPLICATION TOPICALLY DAILY 50 g 0   Turmeric 500 MG CAPS Take by mouth daily.     furosemide (LASIX) 20 MG tablet TAKE 1 TABLET BY MOUTH EVERY OTHER DAY (Patient not taking: Reported on 03/07/2023) 45 tablet 1   iron polysaccharides (NIFEREX) 150 MG capsule Take 1 capsule (150 mg total) by mouth every other day. 30 capsule 1   No current facility-administered medications for this visit.   Facility-Administered Medications Ordered in Other Visits  Medication Dose Route Frequency Provider Last Rate Last Admin   0.9 %  sodium chloride infusion   Intravenous Continuous Rickard Patience, MD 20 mL/hr at 12/11/22 1458 New Bag at 12/11/22 1458    Review of Systems  Constitutional:  Positive for fatigue.  Negative for chills and fever.  HENT:   Negative for hearing loss and voice change.   Eyes:  Negative for eye problems.  Respiratory:  Negative for chest tightness, cough and shortness of breath.   Cardiovascular:  Negative for chest pain.  Gastrointestinal:   Negative for abdominal distention and abdominal pain.       Dark stool  Endocrine: Negative for hot flashes.  Genitourinary:  Negative for difficulty urinating and frequency.   Musculoskeletal:  Negative for arthralgias.  Skin:  Negative for itching and rash.  Neurological:  Negative for extremity weakness and light-headedness.  Hematological:  Negative for adenopathy.  Psychiatric/Behavioral:  Negative for confusion.     PHYSICAL EXAMINATION: ECOG PERFORMANCE STATUS: 2 - Symptomatic, <50% confined to bed Vitals:   05/06/23 1324  BP: (!) 147/53  Pulse: 72  Resp: 18  Temp: (!) 97.5 F (36.4 C)  SpO2: 99%   Physical Exam Constitutional:      General: She is not in acute distress.    Comments: Patient sits in a wheelchair  HENT:     Head: Normocephalic and atraumatic.  Eyes:     General: No scleral icterus. Cardiovascular:     Rate and Rhythm: Normal rate and regular rhythm.     Heart sounds: Murmur heard.  Pulmonary:     Effort: Pulmonary effort is normal. No respiratory distress.     Breath sounds: No wheezing.  Abdominal:     General: Bowel sounds are normal. There is no distension.     Palpations: Abdomen is soft.  Musculoskeletal:        General: No deformity. Normal range of motion.     Cervical back: Normal range of motion and neck supple.     Comments: Trace edema bilaterally  Skin:    General: Skin is warm and dry.     Coloration: Skin is pale.     Findings: No erythema or rash.  Neurological:     Mental Status: She is alert and oriented to person, place, and time. Mental status is at baseline.     Cranial Nerves: No cranial nerve deficit.  Psychiatric:        Mood and Affect: Mood normal.      LABORATORY DATA:  I have reviewed the data as listed    Latest Ref Rng & Units 05/01/2023   11:32 AM 03/07/2023    2:51 PM 12/23/2022   11:17 AM  CBC  WBC 4.0 - 10.5 K/uL 5.7  7.2  4.4   Hemoglobin 12.0 - 15.0 g/dL 16.1  09.6  04.5   Hematocrit 36.0 - 46.0 %  34.6  38.8  32.4   Platelets 150 - 400 K/uL 177  189  174       Latest Ref Rng & Units 03/07/2023    2:51 PM 11/27/2022    3:17 AM 11/25/2022   12:06 PM  CMP  Glucose 70 - 99 mg/dL 409  811  914   BUN 8 - 23 mg/dL 18  18  32   Creatinine 0.44 - 1.00 mg/dL 7.82  9.56  2.13   Sodium 135 - 145 mmol/L 134  137  135   Potassium 3.5 - 5.1 mmol/L 4.2  3.8  4.3   Chloride 98 - 111 mmol/L 100  107  104   CO2 22 - 32 mmol/L 26  25  21    Calcium 8.9 - 10.3 mg/dL 8.7  8.2  8.5   Total Protein 6.5 - 8.1  g/dL   5.7   Total Bilirubin 0.3 - 1.2 mg/dL   0.8   Alkaline Phos 38 - 126 U/L   77   AST 15 - 41 U/L   21   ALT 0 - 44 U/L   13       Component Value Date/Time   IRON 108 05/01/2023 1132   IRON 36 12/04/2021 1302   TIBC 305 05/01/2023 1132   TIBC 327 12/04/2021 1302   FERRITIN 113 05/01/2023 1132   FERRITIN 115 10/02/2022 1305   IRONPCTSAT 35 (H) 05/01/2023 1132   IRONPCTSAT 11 (L) 12/04/2021 1302     RADIOGRAPHIC STUDIES: I have personally reviewed the radiological images as listed and agreed with the findings in the report. No results found.

## 2023-05-06 NOTE — Assessment & Plan Note (Addendum)
Labs are reviewed and discussed with patient. Both hemoglobin and iron panel have improved. Lab Results  Component Value Date   HGB 11.4 (L) 05/01/2023   TIBC 305 05/01/2023   IRONPCTSAT 35 (H) 05/01/2023   FERRITIN 113 05/01/2023     No need for additional IV Venofer treatment today.  Continue oral iron supplementation, Niferex 150 mg every other day.

## 2023-05-06 NOTE — Assessment & Plan Note (Signed)
anemia secondary to chronic kidney disease, hemoglobin is above 10.  No need for erythropoietin therapy. Continue oral iron supplementation. 

## 2023-05-21 DIAGNOSIS — E1122 Type 2 diabetes mellitus with diabetic chronic kidney disease: Secondary | ICD-10-CM | POA: Diagnosis not present

## 2023-05-21 DIAGNOSIS — N1831 Chronic kidney disease, stage 3a: Secondary | ICD-10-CM | POA: Diagnosis not present

## 2023-05-21 DIAGNOSIS — Z6825 Body mass index (BMI) 25.0-25.9, adult: Secondary | ICD-10-CM | POA: Diagnosis not present

## 2023-05-26 ENCOUNTER — Telehealth: Payer: Self-pay | Admitting: *Deleted

## 2023-05-26 NOTE — Telephone Encounter (Signed)
Patient called reporting that the iron she was put on is upsetting her stomach andis asking if she is to continue taking or stop. Please Jasmine Webster

## 2023-05-26 NOTE — Telephone Encounter (Signed)
Call returned to patient and informed of doctor response, she said she may try to take it every 3 days because it is making her feel better. I asked if she is taking it with food and she states that yes, she takes it after breakfast every other day  She thanked me for calling her back and letting her know that she can stop if she chooses

## 2023-05-29 ENCOUNTER — Ambulatory Visit: Payer: PPO | Admitting: Podiatry

## 2023-06-18 ENCOUNTER — Other Ambulatory Visit: Payer: Self-pay | Admitting: Family Medicine

## 2023-06-18 NOTE — Telephone Encounter (Signed)
Medication Refill - Medication:  ezetimibe (ZETIA) 10 MG tablet (Been out for over a week now)  Has the patient contacted their pharmacy? Yes.  Contacted pharmacy last week and other times and advised to call PCP, patient does not understand why this has not been filled    Preferred Pharmacy (with phone number or street name): [id number] - [place], [place] [address]  Phone: [phone] Fax: [phone]  Has the patient been seen for an appointment in the last year OR does the patient have an upcoming appointment? Yes.  Medicare AWV scheduled for 10.28.24 & f/u with PCP scheduled for 11.18.24

## 2023-06-19 MED ORDER — EZETIMIBE 10 MG PO TABS: 10.0000 mg | ORAL_TABLET | Freq: Every day | ORAL | 3 refills | Status: DC

## 2023-06-19 NOTE — Telephone Encounter (Signed)
Requested Prescriptions  Pending Prescriptions Disp Refills   ezetimibe (ZETIA) 10 MG tablet 30 tablet 11    Sig: Take 1 tablet (10 mg total) by mouth daily.     Cardiovascular:  Antilipid - Sterol Transport Inhibitors Failed - 06/18/2023  9:55 AM      Failed - Lipid Panel in normal range within the last 12 months    Cholesterol, Total  Date Value Ref Range Status  10/02/2022 230 (H) 100 - 199 mg/dL Final   LDL Chol Calc (NIH)  Date Value Ref Range Status  10/02/2022 141 (H) 0 - 99 mg/dL Final   HDL  Date Value Ref Range Status  10/02/2022 46 >39 mg/dL Final   Triglycerides  Date Value Ref Range Status  10/02/2022 237 (H) 0 - 149 mg/dL Final         Passed - AST in normal range and within 360 days    AST  Date Value Ref Range Status  11/25/2022 21 15 - 41 U/L Final         Passed - ALT in normal range and within 360 days    ALT  Date Value Ref Range Status  11/25/2022 13 0 - 44 U/L Final         Passed - Patient is not pregnant      Passed - Valid encounter within last 12 months    Recent Outpatient Visits           2 months ago Primary hypertension   [facility] Simmons-Robinson, [place], MD   6 months ago Melena   [facility] Williams, [place], MD   8 months ago Benign essential HTN   [facility] [place], [place], MD   10 months ago Walking pneumonia   [facility] Bosie Clos, MD   11 months ago Walking pneumonia   [facility] Bosie Clos, MD       Future Appointments             In 3 months Simmons-Robinson, Tawanna Cooler, MD [facility], [facility]

## 2023-06-24 ENCOUNTER — Ambulatory Visit: Payer: Self-pay | Admitting: *Deleted

## 2023-06-24 ENCOUNTER — Telehealth: Payer: PPO | Admitting: Physician Assistant

## 2023-06-24 DIAGNOSIS — U071 COVID-19: Secondary | ICD-10-CM | POA: Diagnosis not present

## 2023-06-24 NOTE — Progress Notes (Unsigned)
MyChart Video Visit  Virtual Visit via Video Note   This format is felt to be most appropriate for this patient at this time. Physical exam was limited by quality of the video and audio technology used for the visit.   Patient location: office Patient Location: Home  I discussed the limitations of evaluation and management by telemedicine and the availability of in person appointments. The patient expressed understanding and agreed to proceed.  Patient: Jasmine Webster   DOB: 1926-11-08   89 y.o. Female  MRN: [id number] Visit Date: 06/24/2023  Today's healthcare provider: Debera Lat, PA-C   Chief Complaint  Patient presents with   Covid Positive    Runny nose, coughing, sneezing, headaches, prior treatment Aleve   Subjective    HPI HPI     Covid Positive    Additional comments: Runny nose, coughing, sneezing, headaches, prior treatment Aleve      Last edited by Rolly Salter, CMA on 06/24/2023 11:27 AM.      Sore throat Onset This  Morning  Discussed the use of AI scribe software for clinical note transcription with the patient, who gave verbal consent to proceed.  History of Present Illness             Medications: Outpatient Medications Prior to Visit  Medication Sig   ALPRAZolam (XANAX) 0.5 MG tablet TAKE 1 TABLET BY MOUTH EVERY DAY AT BEDTIME AS NEEDED   Artificial Tear Ointment (DRY EYES OP) Apply 1 drop to eye daily.   ascorbic acid (VITAMIN C) 250 MG tablet Take 1 tablet (250 mg total) by mouth daily.   co-enzyme Q-10 30 MG capsule Take 30 mg by mouth daily.   cyanocobalamin (VITAMIN B12) 500 MCG tablet Take 1 tablet (500 mcg total) by mouth once a week.   ezetimibe (ZETIA) 10 MG tablet Take 1 tablet (10 mg total) by mouth daily.   furosemide (LASIX) 20 MG tablet TAKE 1 TABLET BY MOUTH EVERY OTHER DAY   insulin glargine-yfgn (SEMGLEE, YFGN,) 100 UNIT/ML Pen INJECT 15 UNITS SUBCUTANEOUSLY   Insulin Pen Needle (B-D ULTRAFINE III SHORT PEN) 31G X 8  MM MISC Use for administering glucose twice daily   iron polysaccharides (NIFEREX) 150 MG capsule Take 1 capsule (150 mg total) by mouth every other day.   lisinopril (ZESTRIL) 10 MG tablet TAKE 1 TABLET BY MOUTH EVERY DAY   Magnesium 400 MG CAPS Take 400 mg by mouth daily.   Multiple Vitamins-Minerals (PRESERVISION AREDS) CAPS Take 1 capsule by mouth 2 (two) times daily.    nitroGLYCERIN (NITROSTAT) 0.4 MG SL tablet Place 1 tablet (0.4 mg total) under the tongue every 5 (five) minutes as needed for chest pain.   Omega-3 Fatty Acids (FISH OIL PO) Take by mouth daily.   omeprazole (PRILOSEC) 40 MG capsule Take 40 mg by mouth daily.   ONETOUCH ULTRA test strip CHECK SUGAR TWICE A DAY   pantoprazole (PROTONIX) 40 MG tablet Take 1 tablet (40 mg total) by mouth 2 (two) times daily before a meal.   potassium chloride (KLOR-CON) 10 MEQ tablet TAKE 1 TABLET (10 MEQ TOTAL) BY MOUTH DAILY AS NEEDED. TAKE WITH FUROSEMIDE   silver sulfADIAZINE (SILVADENE) 1 % cream APPLY 1 APPLICATION TOPICALLY DAILY   Turmeric 500 MG CAPS Take by mouth daily.   Facility-Administered Medications Prior to Visit  Medication Dose Route Frequency Provider   0.9 %  sodium chloride infusion   Intravenous Continuous Rickard Patience, MD    Review of Systems  All other systems reviewed and are negative.   Except see HPI   {Insert previous labs (optional):23779} {See past labs  Heme  Chem  Endocrine  Serology  Results Review (optional):1}   Objective    There were no vitals taken for this visit.  {Insert last BP/Wt (optional):23777}{See vitals history (optional):1}    Physical Exam Constitutional:      General: She is not in acute distress.    Appearance: Normal appearance.  HENT:     Head: Normocephalic.  Pulmonary:     Effort: Pulmonary effort is normal. No respiratory distress.  Neurological:     Mental Status: She is alert and oriented to person, place, and time. Mental status is at baseline.         Assessment & Plan        No follow-ups on file.     I discussed the assessment and treatment plan with the patient. The patient was provided an opportunity to ask questions and all were answered. The patient agreed with the plan and demonstrated an understanding of the instructions.   The patient was advised to call back or seek an in-person evaluation if the symptoms worsen or if the condition fails to improve as anticipated.  I provided *** minutes of non-face-to-face time during this encounter.  [facility]

## 2023-06-24 NOTE — Telephone Encounter (Signed)
:   cold and flu like symptoms   The patient has tested positive for COVID 19 via an at home test today 06/24/23  The patient first began to notice congestion and a sore throat roughly two days ago  Please contact further when possible     Chief Complaint: Covid Positive Symptoms: Dry cough, sore throat, runny nose, headache, congestion Frequency: Tested positive today, symptoms onset yesterday. Pertinent Negatives: Patient denies fever, SOB Disposition: [] ED /[] Urgent Care (no appt availability in office) / [x] Appointment(In office/virtual)/ []  [facility]/ [] Home Care/ [] Refused Recommended Disposition /[] [facility]/ []  Follow-up with PCP Additional Notes: Pt may be interested in oral anti-viral.VV secured for 11am. Care advise provided, self isolation guidelines reviewed. Pt verbalizes understanding.   Reason for Disposition  [1] HIGH RISK patient (e.g., weak immune system, age > 64 years, obesity with BMI 30 or higher, pregnant, chronic lung disease or other chronic medical condition) AND [2] COVID symptoms (e.g., cough, fever)  (Exceptions: Already seen by PCP and no new or worsening symptoms.)  Answer Assessment - Initial Assessment Questions 1. COVID-19 DIAGNOSIS: "How do you know that you have COVID?" (e.g., positive lab test or self-test, diagnosed by doctor or NP/PA, symptoms after exposure).     Home test 2. COVID-19 EXPOSURE: "Was there any known exposure to COVID before the symptoms began?" CDC Definition of close contact: within 6 feet (2 meters) for a total of 15 minutes or more over a 24-hour period.      no 3. ONSET: "When did the COVID-19 symptoms start?"      Monday 4. WORST SYMPTOM: "What is your worst symptom?" (e.g., cough, fever, shortness of breath, muscle aches)     Runny nose 5. COUGH: "Do you have a cough?" If Yes, ask: "How bad is the cough?"       Yes, dry 6. FEVER: "Do you have a fever?" If Yes, ask: "What is your temperature, how was it  measured, and when did it start?"     No 7. RESPIRATORY STATUS: "Describe your breathing?" (e.g., normal; shortness of breath, wheezing, unable to speak)      No 8. BETTER-SAME-WORSE: "Are you getting better, staying the same or getting worse compared to yesterday?"  If getting worse, ask, "In what way?"     Same 9. OTHER SYMPTOMS: "Do you have any other symptoms?"  (e.g., chills, fatigue, headache, loss of smell or taste, muscle pain, sore throat)     Dry cough, sore throat, congestion,headache 10. HIGH RISK DISEASE: "Do you have any chronic medical problems?" (e.g., asthma, heart or lung disease, weak immune system, obesity, etc.)       yes 11. VACCINE: "Have you had the COVID-19 vaccine?" If Yes, ask: "Which one, how many shots, when did you get it?"       Yes, all  Protocols used: Coronavirus (COVID-19) Diagnosed or Suspected-A-AH

## 2023-06-25 ENCOUNTER — Other Ambulatory Visit: Payer: Self-pay | Admitting: Oncology

## 2023-06-25 ENCOUNTER — Ambulatory Visit: Payer: Self-pay | Admitting: *Deleted

## 2023-06-25 ENCOUNTER — Encounter: Payer: Self-pay | Admitting: Physician Assistant

## 2023-06-25 DIAGNOSIS — U071 COVID-19: Secondary | ICD-10-CM

## 2023-06-25 NOTE — Telephone Encounter (Signed)
  Chief Complaint: VV yesterday and under impression she was going to get a medication for covid sent to pharmacy by J. Ostwalt, PA  Symptoms: na  Frequency: yesterday  Pertinent Negatives: Patient denies na Disposition: [] ED /[] Urgent Care (no appt availability in office) / [] Appointment(In office/virtual)/ []  [place] [facility]/ [] Home Care/ [] Refused Recommended Disposition /[] [place] [facility]/ [x]  Follow-up with PCP Additional Notes:   Please advise. Patient reports she thought she was going to get a medication prescribed by provider J. Ostwalt, PA yesterday after VV and when she was to receive med to call provider back to review how she was to take medication. No medication was prescribed or at pharmacy . Patient reports due to language barrier and difficulty understanding instructions  she would like clarification. Reviewed with patient VV notes to  use saline nasal spray, mucinex as directed . Patient concerned she is on day 3 of sx and told if no medication started within 3 days would not be useful. Please advise. Patient would like a call back.      Reason for Disposition  Prescription request for new medicine (not a refill)  Answer Assessment - Initial Assessment Questions 1. NAME of MEDICINE: "What medicine(s) are you calling about?"     Medication for covid sx 2. QUESTION: "What is your question?" (e.g., double dose of medicine, side effect)     Patient under impression provider J. Ostwalt, PA was going to write Rx for something for covid 3. PRESCRIBER: "Who prescribed the medicine?" Reason: if prescribed by specialist, call should be referred to that group.     na 4. SYMPTOMS: "Do you have any symptoms?" If Yes, ask: "What symptoms are you having?"  "How bad are the symptoms (e.g., mild, moderate, severe)     Reports she is on day 3 of sx  5. PREGNANCY:  "Is there any chance that you are pregnant?" "When was your last menstrual period?"     na  Protocols used:  Medication Question Call-A-AH

## 2023-06-26 MED ORDER — NIRMATRELVIR/RITONAVIR (PAXLOVID) TABLET (RENAL DOSING)
2.0000 | ORAL_TABLET | Freq: Two times a day (BID) | ORAL | 0 refills | Status: AC
Start: 2023-06-26 — End: 2023-07-01

## 2023-06-30 ENCOUNTER — Telehealth: Payer: Self-pay

## 2023-06-30 IMAGING — CR DG CHEST 2V
1 series · 2 of 2 positions shown · non-contrast
Comparison: Radiographs 01/14/2022 and 11/26/2021.

CLINICAL DATA: Abnormal lung sounds with cough. History of
pneumonia.

EXAM:
CHEST - 2 VIEW

[Series 1: dg chest 2 view · 0.14mm/px · 2 of 2 slices shown]
[im 1/2]
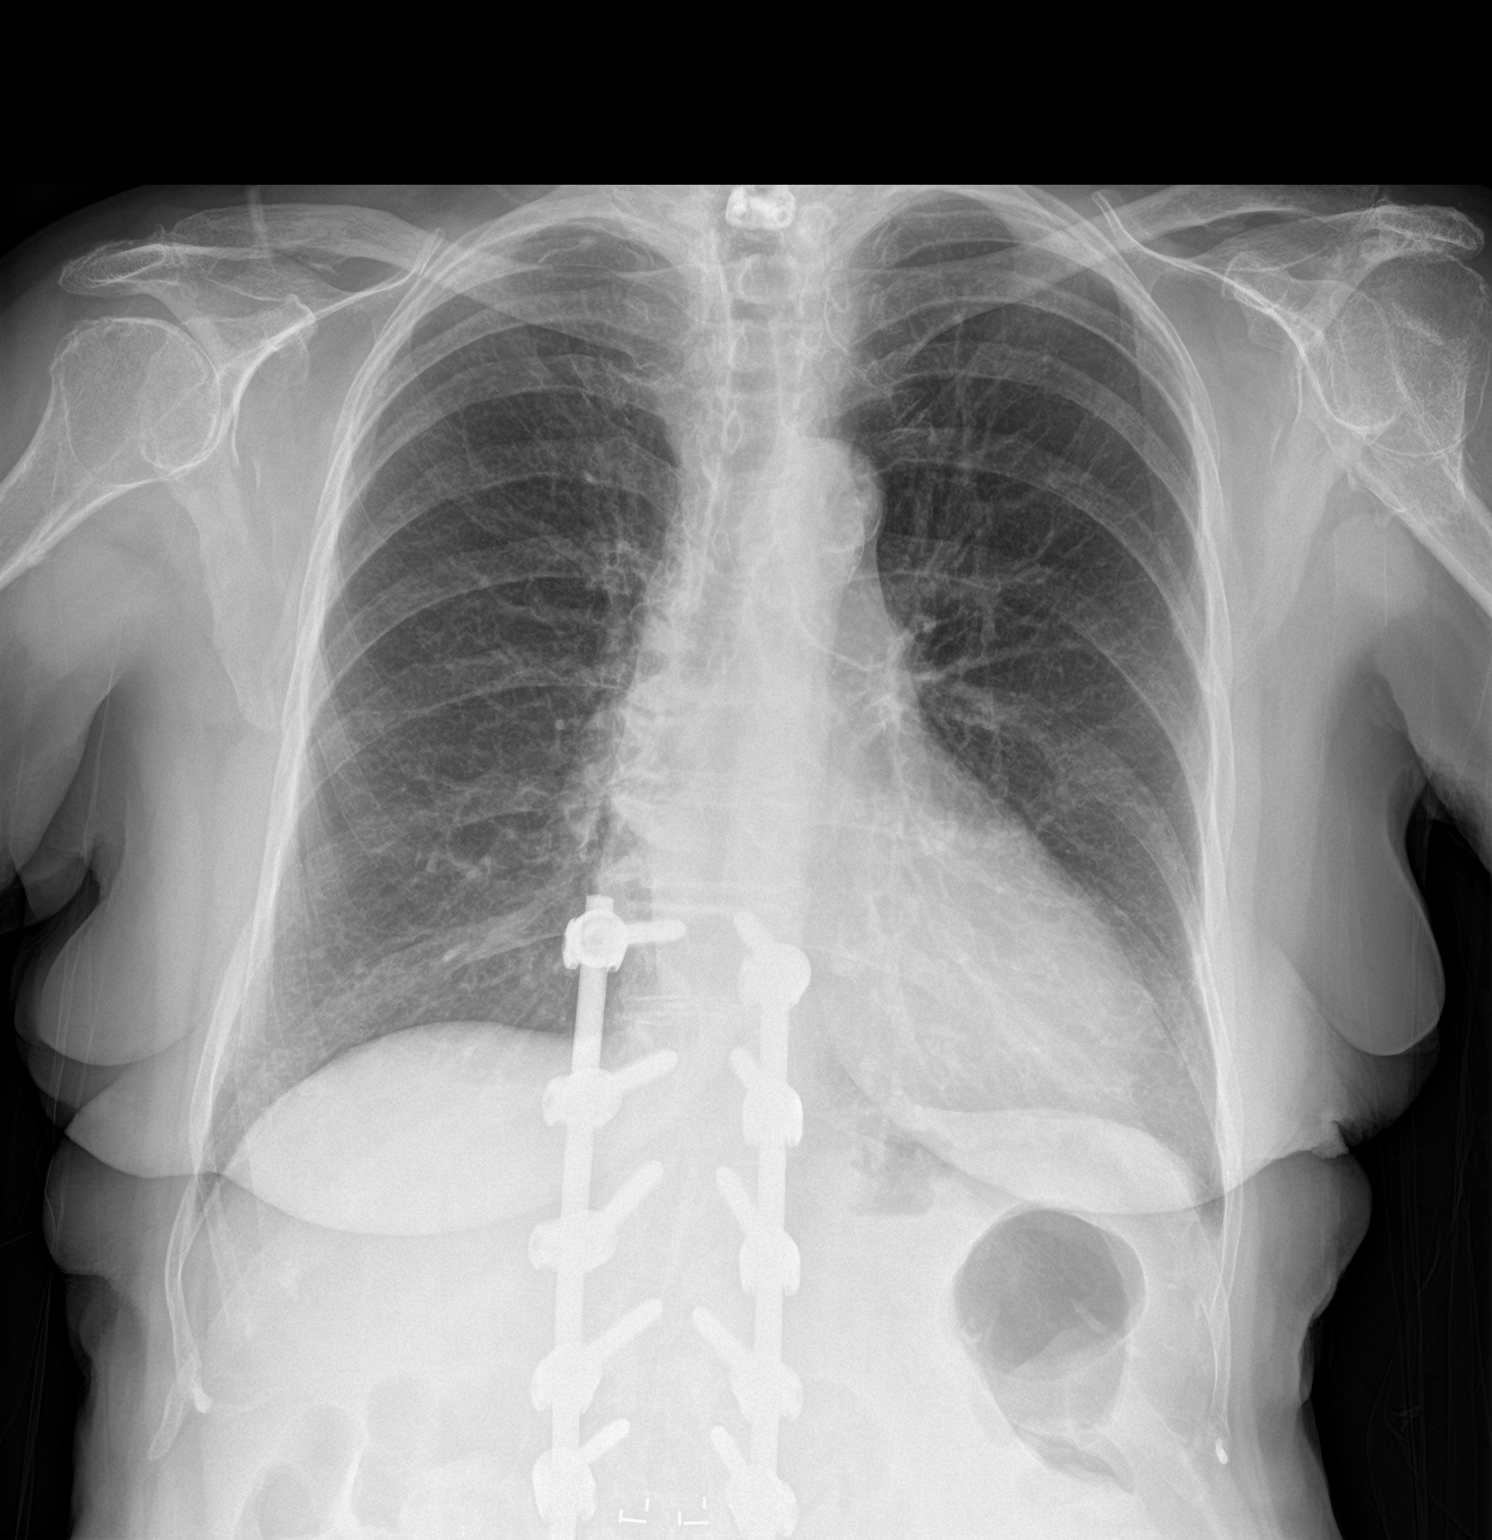
[im 2/2]
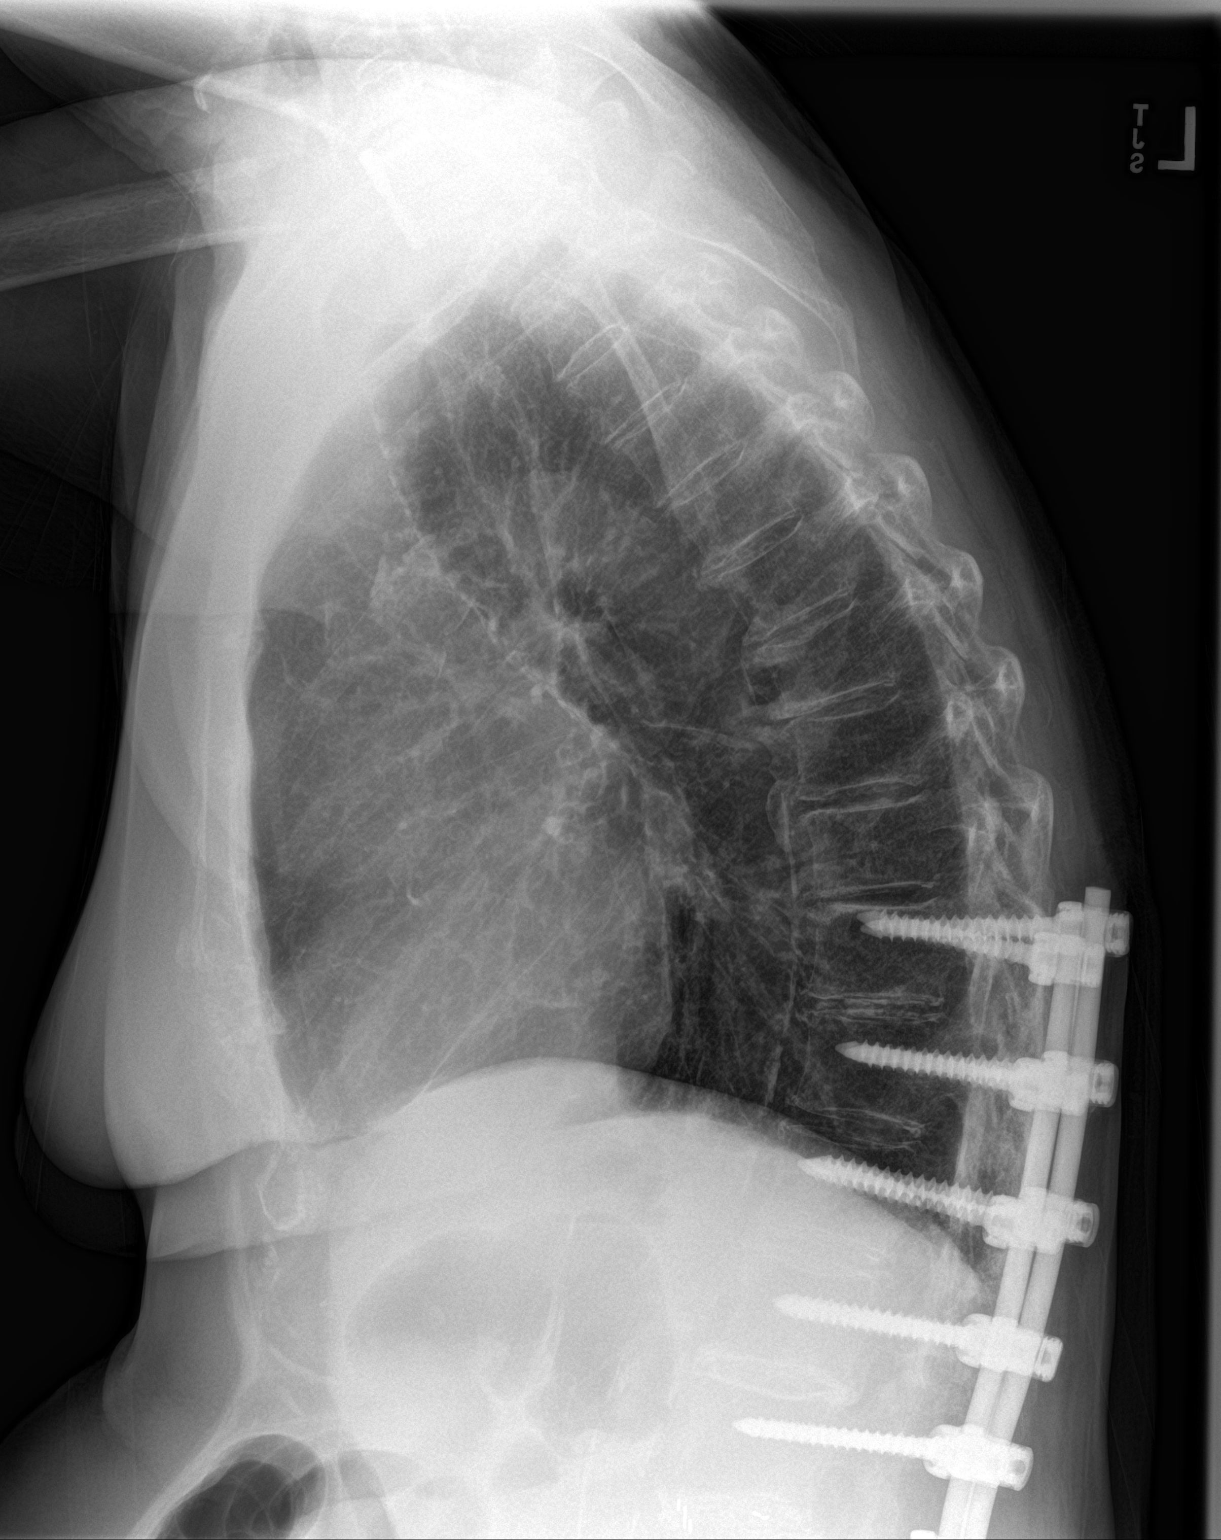

[2 of 2 positions shown; findings below may reference images not displayed]

FINDINGS: The heart size and mediastinal contours are stable with the heart
size at the upper limits of normal. There is aortic atherosclerosis.
The lungs appear clear. There is no pleural effusion or
pneumothorax. No acute osseous findings are seen status post
multilevel thoracolumbar fusion. The visualized hardware is intact.
Patient is also status post lower cervical fusion. Posttraumatic
deformity of the left proximal humerus is unchanged.
IMPRESSION: Stable radiographic appearance of the chest. No active
cardiopulmonary process.

## 2023-06-30 NOTE — Telephone Encounter (Signed)
Advised patient that Jasmine Webster would like for her to come down on the dosage to half with the Xanex, Patient stated she's not taking it.  Patient was asked if she was still in need of an antiviral. Patient states that she doesn't need anything at the moment

## 2023-07-17 ENCOUNTER — Other Ambulatory Visit: Payer: Self-pay | Admitting: Family Medicine

## 2023-07-17 ENCOUNTER — Ambulatory Visit: Payer: PPO | Admitting: Podiatry

## 2023-07-17 DIAGNOSIS — M79675 Pain in left toe(s): Secondary | ICD-10-CM | POA: Diagnosis not present

## 2023-07-17 DIAGNOSIS — E119 Type 2 diabetes mellitus without complications: Secondary | ICD-10-CM

## 2023-07-17 DIAGNOSIS — M79674 Pain in right toe(s): Secondary | ICD-10-CM | POA: Diagnosis not present

## 2023-07-17 DIAGNOSIS — B351 Tinea unguium: Secondary | ICD-10-CM | POA: Diagnosis not present

## 2023-07-17 NOTE — Telephone Encounter (Signed)
Medication Refill - Medication: lisinopril (ZESTRIL) 10 MG tablet   Has the patient contacted their pharmacy? Yes.    Preferred Pharmacy (with phone number or street name):  [phone] [place], [place] [address] Phone: [phone]  Fax: [phone]     Has the patient been seen for an appointment in the last year OR does the patient have an upcoming appointment? Yes.    Agent: Please be advised that RX refills may take up to 3 business days. We ask that you follow-up with your pharmacy.

## 2023-07-17 NOTE — Progress Notes (Signed)
  Subjective:  Patient ID: Jasmine Webster, female    DOB: October 25, 1926,  MRN: [id number]  Jasmine Webster presents to clinic today for preventative diabetic foot care and painful elongated mycotic toenails 1-5 bilaterally which are tender when wearing enclosed shoe gear. Pain is relieved with periodic professional debridement.  Chief Complaint  Patient presents with   Nail Problem    DFC,Referring Provider Simmons-Robinson, Tawanna Cooler, MD,lov:05/24  A1C:7.1,BS:170   New problem(s): None.   PCP is Simmons-Robinson, Financial controller, MD.  Allergies  Allergen Reactions   Etodolac Nausea Only, Other (See Comments) and Anxiety    Dizziness, too   Naproxen Other (See Comments) and Anxiety    Gave her an ulcer after taking it for years   Statins Other (See Comments)    No energy and myalgias   Latex Rash   Terbinafine Other (See Comments) and Rash    "Maybe made me nervous"   Terbinafine Hcl Rash    Review of Systems: Negative except as noted in the HPI.  Objective: No changes noted in today's physical examination. There were no vitals filed for this visit. Jasmine Webster is a pleasant 89 y.o. female WD, WN in NAD. AAO x 3.  Vascular Examination: CFT <3 seconds b/l. DP/PT pulses faintly palpable b/l. Skin temperature gradient warm to warm b/l. No pain with calf compression. No ischemia or gangrene. No cyanosis or clubbing noted b/l.    Neurological Examination: Sensation grossly intact b/l with 10 gram monofilament. Vibratory sensation intact b/l.   Dermatological Examination: Pedal skin warm and supple b/l.   No open wounds. No interdigital macerations.  Toenails 1-5 b/l thick, discolored, elongated with subungual debris and pain on dorsal palpation.    No corns, calluses nor porokeratotic lesions noted.  Musculoskeletal Examination: Muscle strength 5/5 to all lower extremity muscle groups bilaterally. No pain, crepitus or joint limitation noted with ROM bilateral LE. HAV with bunion  deformity noted b/l LE. Hammertoe(s) noted to the bilateral 2nd toes. Utilizes cane for ambulation assistance.  Radiographs: None  Last A1c:      Latest Ref Rng & Units 04/03/2023   11:27 AM 10/02/2022    1:05 PM  Hemoglobin A1C  Hemoglobin-A1c 4.0 - 5.6 % 7.1  6.4    Assessment/Plan: 1. Pain due to onychomycosis of toenails of both feet   2. Diabetes mellitus without complication [facility])     Patient was evaluated and treated. All patient's and/or POA's questions/concerns addressed on today's visit. Toenails 1-5 debrided in length and girth without incident. Continue soft, supportive shoe gear daily. Report any pedal injuries to medical professional. Call office if there are any questions/concerns. -Patient/POA to call should there be question/concern in the interim.   Return in about 4 months (around 11/16/2023).  Freddie Breech, DPM

## 2023-07-18 MED ORDER — LISINOPRIL 10 MG PO TABS: 10.0000 mg | ORAL_TABLET | Freq: Every day | ORAL | 5 refills | Status: DC

## 2023-07-18 NOTE — Telephone Encounter (Signed)
Requested Prescriptions  Pending Prescriptions Disp Refills   lisinopril (ZESTRIL) 10 MG tablet 30 tablet 5    Sig: Take 1 tablet (10 mg total) by mouth daily.     Cardiovascular:  ACE Inhibitors Failed - 07/17/2023  4:21 PM      Failed - Last BP in normal range    BP Readings from Last 1 Encounters:  05/06/23 (!) 147/53         Passed - Cr in normal range and within 180 days    Creatinine, Ser  Date Value Ref Range Status  03/07/2023 0.98 0.44 - 1.00 mg/dL Final   Creatinine, Urine  Date Value Ref Range Status  06/20/2022 73 mg/dL Final         Passed - K in normal range and within 180 days    Potassium  Date Value Ref Range Status  03/07/2023 4.2 3.5 - 5.1 mmol/L Final         Passed - Patient is not pregnant      Passed - Valid encounter within last 6 months    Recent Outpatient Visits           3 weeks ago COVID-19   [facility] [place], [place], PA-C   3 months ago Primary hypertension   [facility] Simmons-Robinson, [place], MD   7 months ago Melena   [place] [facility] [place], [place], MD   9 months ago Benign essential HTN   [facility] [place], [place], MD   11 months ago Walking pneumonia   [facility] Bosie Clos, MD       Future Appointments             In 2 months Simmons-Robinson, Tawanna Cooler, MD [facility]

## 2023-07-20 ENCOUNTER — Encounter: Payer: Self-pay | Admitting: Podiatry

## 2023-07-23 DIAGNOSIS — E119 Type 2 diabetes mellitus without complications: Secondary | ICD-10-CM | POA: Diagnosis not present

## 2023-07-23 DIAGNOSIS — H3554 Dystrophies primarily involving the retinal pigment epithelium: Secondary | ICD-10-CM | POA: Diagnosis not present

## 2023-07-23 LAB — HM DIABETES EYE EXAM

## 2023-08-12 ENCOUNTER — Other Ambulatory Visit: Payer: Self-pay | Admitting: Family Medicine

## 2023-08-13 NOTE — Telephone Encounter (Signed)
OK to change to 90 day supply Requested Prescriptions  Pending Prescriptions Disp Refills   ezetimibe (ZETIA) 10 MG tablet [Pharmacy Med Name: EZETIMIBE 10 MG TABLET] 90 tablet 1    Sig: TAKE 1 TABLET BY MOUTH EVERY DAY     Cardiovascular:  Antilipid - Sterol Transport Inhibitors Failed - 08/12/2023  1:14 PM      Failed - Lipid Panel in normal range within the last 12 months    Cholesterol, Total  Date Value Ref Range Status  10/02/2022 230 (H) 100 - 199 mg/dL Final   LDL Chol Calc (NIH)  Date Value Ref Range Status  10/02/2022 141 (H) 0 - 99 mg/dL Final   HDL  Date Value Ref Range Status  10/02/2022 46 >39 mg/dL Final   Triglycerides  Date Value Ref Range Status  10/02/2022 237 (H) 0 - 149 mg/dL Final         Passed - AST in normal range and within 360 days    AST  Date Value Ref Range Status  11/25/2022 21 15 - 41 U/L Final         Passed - ALT in normal range and within 360 days    ALT  Date Value Ref Range Status  11/25/2022 13 0 - 44 U/L Final         Passed - Patient is not pregnant      Passed - Valid encounter within last 12 months    Recent Outpatient Visits           1 month ago COVID-19   [facility] [place], [place], PA-C   4 months ago Primary hypertension   [place] [facility] Simmons-Robinson, [place], MD   8 months ago Melena   [place] [facility] [place], [place], MD   10 months ago Benign essential HTN   [place] [facility] [place], [place], MD   1 year ago Walking pneumonia   [facility] Bosie Clos, MD       Future Appointments             In 1 month Simmons-Robinson, Tawanna Cooler, MD [facility], [facility]

## 2023-08-18 ENCOUNTER — Ambulatory Visit: Payer: Self-pay | Admitting: *Deleted

## 2023-08-18 NOTE — Telephone Encounter (Signed)
Summary:  Per agent:med ?   "Pt called in wants to know if its ok to take mucinex or Dayquil with her heart problem."     Chief Complaint: Cough Symptoms: Dry cough, runny nose, sneezing Frequency: x1 week Pertinent Negatives: Patient denies fever, SOB Disposition: [] ED /[] Urgent Care (no appt availability in office) / [] Appointment(In office/virtual)/ []  [place] [facility]/ [] Home Care/ [] Refused Recommended Disposition /[] [place] [facility]/ [x]  Follow-up with PCP Additional Notes: Pt initially called questioning what OTC med she could take. Triaged for cough. Stated daughter bought Coricidin. Advised NT would route to PCP for review with pt's H/O. Did give home care advise, advised amy need to be seen. Pt verbalizes understanding.   Reason for Disposition  Cough with cold symptoms (e.g., runny nose, postnasal drip, throat clearing)  Answer Assessment - Initial Assessment Questions 1. ONSET: "When did the cough begin?"      1 week 2. SEVERITY: "How bad is the cough today?"      Bad spells during day 3. SPUTUM: "Describe the color of your sputum" (none, dry cough; clear, white, yellow, green)     Dry 4. HEMOPTYSIS: "Are you coughing up any blood?" If so ask: "How much?" (flecks, streaks, tablespoons, etc.)     No 5. DIFFICULTY BREATHING: "Are you having difficulty breathing?" If Yes, ask: "How bad is it?" (e.g., mild, moderate, severe)    - MILD: No SOB at rest, mild SOB with walking, speaks normally in sentences, can lie down, no retractions, pulse < 100.    - MODERATE: SOB at rest, SOB with minimal exertion and prefers to sit, cannot lie down flat, speaks in phrases, mild retractions, audible wheezing, pulse 100-120.    - SEVERE: Very SOB at rest, speaks in single words, struggling to breathe, sitting hunched forward, retractions, pulse > 120      no 6. FEVER: "Do you have a fever?" If Yes, ask: "What is your temperature, how was it measured, and when did it start?"      No 7. CARDIAC HISTORY: "Do you have any history of heart disease?" (e.g., heart attack, congestive heart failure)      Yes 8. LUNG HISTORY: "Do you have any history of lung disease?"  (e.g., pulmonary embolus, asthma, emphysema)      9. PE RISK FACTORS: "Do you have a history of blood clots?" (or: recent major surgery, recent prolonged travel, bedridden)      10. OTHER SYMPTOMS: "Do you have any other symptoms?" (e.g., runny nose, wheezing, chest pain)       Runny nose, sneezing  Protocols used: Cough - Acute Productive-A-AH

## 2023-08-18 NOTE — Telephone Encounter (Signed)
Patient aware of provider recommendation

## 2023-09-09 DIAGNOSIS — I340 Nonrheumatic mitral (valve) insufficiency: Secondary | ICD-10-CM | POA: Diagnosis not present

## 2023-09-09 DIAGNOSIS — I350 Nonrheumatic aortic (valve) stenosis: Secondary | ICD-10-CM | POA: Diagnosis not present

## 2023-09-09 DIAGNOSIS — E1122 Type 2 diabetes mellitus with diabetic chronic kidney disease: Secondary | ICD-10-CM | POA: Diagnosis not present

## 2023-09-09 DIAGNOSIS — J069 Acute upper respiratory infection, unspecified: Secondary | ICD-10-CM | POA: Diagnosis not present

## 2023-09-09 DIAGNOSIS — Z515 Encounter for palliative care: Secondary | ICD-10-CM | POA: Diagnosis not present

## 2023-09-09 DIAGNOSIS — N1831 Chronic kidney disease, stage 3a: Secondary | ICD-10-CM | POA: Diagnosis not present

## 2023-09-09 DIAGNOSIS — D692 Other nonthrombocytopenic purpura: Secondary | ICD-10-CM | POA: Diagnosis not present

## 2023-09-09 DIAGNOSIS — I10 Essential (primary) hypertension: Secondary | ICD-10-CM | POA: Diagnosis not present

## 2023-09-09 DIAGNOSIS — Z1331 Encounter for screening for depression: Secondary | ICD-10-CM | POA: Diagnosis not present

## 2023-09-09 DIAGNOSIS — I342 Nonrheumatic mitral (valve) stenosis: Secondary | ICD-10-CM | POA: Diagnosis not present

## 2023-09-17 ENCOUNTER — Other Ambulatory Visit: Payer: Self-pay | Admitting: Family Medicine

## 2023-09-17 DIAGNOSIS — N1832 Chronic kidney disease, stage 3b: Secondary | ICD-10-CM

## 2023-09-17 DIAGNOSIS — D500 Iron deficiency anemia secondary to blood loss (chronic): Secondary | ICD-10-CM

## 2023-09-17 MED ORDER — BD PEN NEEDLE SHORT U/F 31G X 8 MM MISC
Status: DC
Start: 2023-09-17 — End: 2023-10-28

## 2023-09-17 MED ORDER — ONETOUCH ULTRASOFT LANCETS MISC: 12 refills | Status: DC

## 2023-09-17 MED ORDER — INSULIN GLARGINE-YFGN 100 UNIT/ML ~~LOC~~ SOPN: PEN_INJECTOR | SUBCUTANEOUS | Status: DC

## 2023-09-17 NOTE — Telephone Encounter (Signed)
Patient needs refills on One Touch Delica lancets, Semglee and BD Ultra fine pen needles.   [facility] [address]

## 2023-09-17 NOTE — Telephone Encounter (Signed)
I have pended prescription request. I don't see the lancets that she is requesting on her current med list. Not sure if I have pended the correct ones. Please review and sign.

## 2023-09-17 NOTE — Addendum Note (Signed)
Addended by: Marjie Skiff on: 09/17/2023 02:34 PM   Modules accepted: Orders

## 2023-09-17 NOTE — Progress Notes (Signed)
Pt presents to clinic without scheduled visit for concerns of low hemoglobin   CBC ordered   'pt  scheduled for follow up visit with me   Ronnald Ramp, MD

## 2023-09-18 LAB — CBC
Hematocrit: 34.3 % (ref 34.0–46.6)
Hemoglobin: 10.9 g/dL — ABNORMAL LOW (ref 11.1–15.9)
MCH: 31.7 pg (ref 26.6–33.0)
MCHC: 31.8 g/dL (ref 31.5–35.7)
MCV: 100 fL — ABNORMAL HIGH (ref 79–97)
RBC: 3.44 x10E6/uL — ABNORMAL LOW (ref 3.77–5.28)
RDW: 12.5 % (ref 11.7–15.4)
WBC: 6.1 10*3/uL (ref 3.4–10.8)

## 2023-09-18 NOTE — Telephone Encounter (Signed)
Pharmacy comment: Script Clarification:NEEDS DIAGNOSIS CODE AND SPECIFIC INSTRUCTIONS FOR MEDICARE B TO COVER.

## 2023-09-23 ENCOUNTER — Ambulatory Visit: Payer: PPO

## 2023-09-23 DIAGNOSIS — Z0000 Encounter for general adult medical examination without abnormal findings: Secondary | ICD-10-CM

## 2023-09-23 NOTE — Progress Notes (Signed)
Subjective:   Jasmine Webster is a 89 y.o. female who presents for Medicare Annual (Subsequent) preventive examination.  Visit Complete: Virtual I connected with  Jasmine Webster on 09/23/23 by a audio enabled telemedicine application and verified that I am speaking with the correct person using two identifiers.  Patient Location: Home  Provider Location: Office/Clinic  I discussed the limitations of evaluation and management by telemedicine. The patient expressed understanding and agreed to proceed.  Vital Signs: Because this visit was a virtual/telehealth visit, some criteria may be missing or patient reported. Any vitals not documented were not able to be obtained and vitals that have been documented are patient reported.  Cardiac Risk Factors include: advanced age (>75men, >57 women);dyslipidemia;diabetes mellitus;hypertension;sedentary lifestyle     Objective:    There were no vitals filed for this visit. There is no height or weight on file to calculate BMI.     09/23/2023   11:11 AM 12/11/2022    2:59 PM 11/25/2022   10:16 PM 09/11/2022   10:38 AM 08/22/2022    2:10 PM 07/15/2022    1:00 PM 06/30/2022   12:03 PM  Advanced Directives  Does Patient Have a Medical Advance Directive? No Yes Yes No Yes Yes Yes  Does patient want to make changes to medical advance directive?  No - Patient declined No - Patient declined   No - Patient declined   Would patient like information on creating a medical advance directive? No - Patient declined   No - Patient declined       Current Medications (verified) Outpatient Encounter Medications as of 09/23/2023  Medication Sig   Artificial Tear Ointment (DRY EYES OP) Apply 1 drop to eye daily.   ascorbic acid (VITAMIN C) 250 MG tablet Take 1 tablet (250 mg total) by mouth daily.   co-enzyme Q-10 30 MG capsule Take 30 mg by mouth daily.   cyanocobalamin (VITAMIN B12) 500 MCG tablet Take 1 tablet (500 mcg total) by mouth once a week.    ezetimibe (ZETIA) 10 MG tablet TAKE 1 TABLET BY MOUTH EVERY DAY   furosemide (LASIX) 20 MG tablet TAKE 1 TABLET BY MOUTH EVERY OTHER DAY   insulin glargine-yfgn (SEMGLEE, YFGN,) 100 UNIT/ML Pen INJECT 15 UNITS SUBCUTANEOUSLY   Insulin Pen Needle (B-D ULTRAFINE III SHORT PEN) 31G X 8 MM MISC Use for administering glucose twice daily   iron polysaccharides (NIFEREX) 150 MG capsule TAKE 1 CAPSULE (150 MG TOTAL) BY MOUTH EVERY OTHER DAY   lisinopril (ZESTRIL) 10 MG tablet Take 1 tablet (10 mg total) by mouth daily.   Magnesium 400 MG CAPS Take 400 mg by mouth daily.   Multiple Vitamins-Minerals (PRESERVISION AREDS) CAPS Take 1 capsule by mouth 2 (two) times daily.    nitroGLYCERIN (NITROSTAT) 0.4 MG SL tablet Place 1 tablet (0.4 mg total) under the tongue every 5 (five) minutes as needed for chest pain.   Omega-3 Fatty Acids (FISH OIL PO) Take by mouth daily.   omeprazole (PRILOSEC) 40 MG capsule Take 40 mg by mouth daily.   ONETOUCH ULTRA test strip CHECK SUGAR TWICE A DAY   OneTouch UltraSoft 2 Lancets MISC USE AS INSTRUCTED   potassium chloride (KLOR-CON) 10 MEQ tablet TAKE 1 TABLET (10 MEQ TOTAL) BY MOUTH DAILY AS NEEDED. TAKE WITH FUROSEMIDE   silver sulfADIAZINE (SILVADENE) 1 % cream APPLY 1 APPLICATION TOPICALLY DAILY   Turmeric 500 MG CAPS Take by mouth daily.   ALPRAZolam (XANAX) 0.5 MG tablet TAKE 1 TABLET  BY MOUTH EVERY DAY AT BEDTIME AS NEEDED (Patient not taking: Reported on 09/23/2023)   pantoprazole (PROTONIX) 40 MG tablet Take 1 tablet (40 mg total) by mouth 2 (two) times daily before a meal. (Patient not taking: Reported on 09/23/2023)   Facility-Administered Encounter Medications as of 09/23/2023  Medication   0.9 %  sodium chloride infusion    Allergies (verified) Etodolac, Naproxen, Statins, Latex, Terbinafine, and Terbinafine hcl   History: Past Medical History:  Diagnosis Date   Arthritis    Cataract    cataract removal bilaterally approx 20 years ago   Chronic back  pain    COVID-19 virus infection 12/2020   Diabetes mellitus without complication (HCC)    GI bleed    Hyperlipidemia    Hypertension    Iron deficiency anemia    Non-obstructive CAD (coronary artery disease)    a. 04/2022 Cath: LM 10ost, LAD 30p, RI 60, LCX min irregs, LPAV min irregs, RCA min irregs.   Severe aortic stenosis    a. 05/2022 Echo: EF 55-60%, no rwma, mild LVH, GrII DD, nl RV fxn, mild MR, mild-mod MS, Sev AS (mean grad 33, peak grad 62, AVA 0.6cm^2).   Past Surgical History:  Procedure Laterality Date   ABDOMINAL HYSTERECTOMY     APPENDECTOMY     BACK SURGERY     three   CARDIAC CATHETERIZATION     6+yrs no stents Dr. Vanita Panda   CATARACT EXTRACTION     RIGHT/LEFT HEART CATH AND CORONARY ANGIOGRAPHY N/A 04/26/2022   Procedure: RIGHT/LEFT HEART CATH AND CORONARY ANGIOGRAPHY;  Surgeon: Iran Ouch, MD;  Location: [facility] INVASIVE CV LAB;  Service: Cardiovascular;  Laterality: N/A;   THORACOLUMBAR SYRINGO SHUNT     TONSILLECTOMY AND ADENOIDECTOMY     Family History  Problem Relation Age of Onset   Heart disease Mother        died from MI   Heart attack Mother    Heart disease Sister    Sudden death Brother        shot and beaten to death during home invasion   Stroke Sister    Sudden death Sister        MVA   Cancer Brother        died from lung cancer   Sudden death Sister        80 days old   Cancer Brother        died from lung cancer   Stroke Brother        cause of death   Diabetes Father    Stroke Father    Pneumonia Father    Social History   Socioeconomic History   Marital status: Widowed    Spouse name: Not on file   Number of children: 2   Years of education: Not on file   Highest education level: 8th grade  Occupational History   Occupation: retired  Tobacco Use   Smoking status: Never   Smokeless tobacco: Never  Vaping Use   Vaping status: Never Used  Substance and Sexual Activity   Alcohol use: No    Alcohol/week: 0.0  standard drinks of alcohol   Drug use: No   Sexual activity: Not Currently  Other Topics Concern   Not on file  Social History Narrative   Lives with daughter, Jasmine Webster   Social Determinants of Health   Financial Resource Strain: Low Risk  (09/23/2023)   Overall Financial Resource Strain (CARDIA)    Difficulty  of Paying Living Expenses: Not hard at all  Food Insecurity: No Food Insecurity (09/23/2023)   Hunger Vital Sign    Worried About Running Out of Food in the Last Year: Never true    Ran Out of Food in the Last Year: Never true  Transportation Needs: No Transportation Needs (09/23/2023)   PRAPARE - Administrator, Civil Service (Medical): No    Lack of Transportation (Non-Medical): No  Physical Activity: Inactive (09/23/2023)   Exercise Vital Sign    Days of Exercise per Week: 0 days    Minutes of Exercise per Session: 0 min  Stress: No Stress Concern Present (09/23/2023)   [employer] of Occupational Health - Occupational Stress Questionnaire    Feeling of Stress : Not at all  Social Connections: Moderately Isolated (09/23/2023)   Social Connection and Isolation Panel [NHANES]    Frequency of Communication with Friends and Family: More than three times a week    Frequency of Social Gatherings with Friends and Family: More than three times a week    Attends Religious Services: More than 4 times per year    Active Member of [employer] or Organizations: No    Attends Banker Meetings: Never    Marital Status: Widowed    Tobacco Counseling Counseling given: Not Answered   Clinical Intake:  Pre-visit preparation completed: Yes  Pain : No/denies pain     Nutritional Status: BMI 25 -29 Overweight Nutritional Risks: None Diabetes: Yes CBG done?: No Did pt. bring in CBG monitor from home?: No  How often do you need to have someone help you when you read instructions, pamphlets, or other written materials from your doctor or pharmacy?: 1 -  Never  Interpreter Needed?: No  Information entered by :: Kennedy Bucker, LPN   Activities of Daily Living    09/23/2023   11:12 AM 12/05/2022   10:18 AM  In your present state of health, do you have any difficulty performing the following activities:  Hearing? 1 1  Vision? 0 1  Difficulty concentrating or making decisions? 0 0  Walking or climbing stairs? 1 1  Dressing or bathing? 0 0  Doing errands, shopping? 0 1  Preparing Food and eating ? N   Using the Toilet? N   In the past six months, have you accidently leaked urine? N   Do you have problems with loss of bowel control? N   Managing your Medications? N   Managing your Finances? N   Housekeeping or managing your Housekeeping? N     Patient Care Team: Ronnald Ramp, MD as PCP - General (Family Medicine) Antonieta Iba, MD as PCP - Cardiology (Cardiology) Galen Manila, MD as Referring Physician (Ophthalmology) Helane Gunther, DPM as Consulting Physician (Podiatry) Mariah Milling, Tollie Pizza, MD as Consulting Physician (Cardiology) Rickard Patience, MD as Consulting Physician (Oncology)  Indicate any recent Medical Services you may have received from other than [facility] providers in the past year (date may be approximate).     Assessment:   This is a routine wellness examination for [place].  Hearing/Vision screen Hearing Screening - Comments:: No aids Vision Screening - Comments:: Wears glasses- Dr.Porfilio   Goals Addressed             This Visit's Progress    DIET - INCREASE WATER INTAKE         Depression Screen    09/23/2023   11:09 AM 04/03/2023   11:28 AM 12/05/2022   10:18 AM  09/11/2022   10:37 AM 07/03/2022    1:55 PM 03/28/2022    3:23 PM 01/14/2022    2:30 PM  PHQ 2/9 Scores  PHQ - 2 Score 0 0 0 0 0 0 0  PHQ- 9 Score 0  3 0   1    Fall Risk    09/23/2023   11:12 AM 04/03/2023   11:28 AM 12/05/2022   10:18 AM 09/11/2022   10:39 AM 03/28/2022    3:23 PM  Fall Risk   Falls in the past  year? 0 0 1 0 0  Number falls in past yr: 0 0 0 0 0  Injury with Fall? 0 0 0 0 0  Risk for fall due to : No Fall Risks No Fall Risks  No Fall Risks   Follow up Falls prevention discussed;Falls evaluation completed   Falls prevention discussed;Falls evaluation completed     MEDICARE RISK AT HOME: Medicare Risk at Home Any stairs in or around the home?: Yes If so, are there any without handrails?: No Home free of loose throw rugs in walkways, pet beds, electrical cords, etc?: Yes Adequate lighting in your home to reduce risk of falls?: Yes Life alert?: No Use of a cane, walker or w/c?: Yes (cane or walker) Grab bars in the bathroom?: Yes Shower chair or bench in shower?: Yes Elevated toilet seat or a handicapped toilet?: Yes  TIMED UP AND GO:  Was the test performed?  No    Cognitive Function:        09/23/2023   11:13 AM 09/11/2022   10:40 AM 08/11/2021   10:49 AM 09/06/2016   11:08 AM  6CIT Screen  What Year? 0 points 0 points 0 points 0 points  What month? 0 points 0 points 0 points 0 points  What time? 0 points 0 points 0 points 0 points  Count back from 20 0 points 0 points 0 points 0 points  Months in reverse 4 points 0 points 0 points 4 points  Repeat phrase 0 points 0 points 0 points 6 points  Total Score 4 points 0 points 0 points 10 points    Immunizations Immunization History  Administered Date(s) Administered   Fluad Quad(high Dose 65+) 08/08/2022   Influenza, High Dose Seasonal PF 09/04/2015, 08/29/2016, 08/02/2018, 08/12/2019, 08/30/2021   Influenza-Unspecified 09/18/2013, 08/16/2017, 08/18/2023   PFIZER(Purple Top)SARS-COV-2 Vaccination 12/02/2019, 12/23/2019, 07/19/2020   Pneumococcal Conjugate-13 12/27/2014   Pneumococcal Polysaccharide-23 07/13/1998   Td 03/29/2010    TDAP status: Due, Education has been provided regarding the importance of this vaccine. Advised may receive this vaccine at local pharmacy or Health Dept. Aware to provide a copy of  the vaccination record if obtained from local pharmacy or Health Dept. Verbalized acceptance and understanding.  Flu Vaccine status: Up to date  Pneumococcal vaccine status: Up to date  Covid-19 vaccine status: Completed vaccines  Qualifies for Shingles Vaccine? Yes   Zostavax completed No   Shingrix Completed?: No.    Education has been provided regarding the importance of this vaccine. Patient has been advised to call insurance company to determine out of pocket expense if they have not yet received this vaccine. Advised may also receive vaccine at local pharmacy or Health Dept. Verbalized acceptance and understanding.  Screening Tests Health Maintenance  Topic Date Due   Zoster Vaccines- Shingrix (1 of 2) Never done   DTaP/Tdap/Td (2 - Tdap) 03/29/2020   FOOT EXAM  12/20/2022   COVID-19 Vaccine (4 - 2023-24 season)  07/20/2023   HEMOGLOBIN A1C  10/04/2023   OPHTHALMOLOGY EXAM  07/22/2024   Medicare Annual Wellness (AWV)  09/22/2024   Pneumonia Vaccine 33+ Years old  Completed   INFLUENZA VACCINE  Completed   DEXA SCAN  Completed   HPV VACCINES  Aged Out    Health Maintenance  Health Maintenance Due  Topic Date Due   Zoster Vaccines- Shingrix (1 of 2) Never done   DTaP/Tdap/Td (2 - Tdap) 03/29/2020   FOOT EXAM  12/20/2022   COVID-19 Vaccine (4 - 2023-24 season) 07/20/2023    Colorectal cancer screening: No longer required.   Mammogram status: No longer required due to age.  Lung Cancer Screening: (Low Dose CT Chest recommended if Age 35-80 years, 20 pack-year currently smoking OR have quit w/in 15years.) does not qualify.   Additional Screening:  Hepatitis C Screening: does not qualify; Completed no  Vision Screening: Recommended annual ophthalmology exams for early detection of glaucoma and other disorders of the eye. Is the patient up to date with their annual eye exam?  Yes  Who is the provider or what is the name of the office in which the patient attends annual  eye exams? Dr.Porfilio If pt is not established with a provider, would they like to be referred to a provider to establish care? No .   Dental Screening: Recommended annual dental exams for proper oral hygiene  Diabetic Foot Exam: Diabetic Foot Exam: Completed 12/20/21  Community Resource Referral / Chronic Care Management: CRR required this visit?  No   CCM required this visit?  No     Plan:     I have personally reviewed and noted the following in the patient's chart:   Medical and social history Use of alcohol, tobacco or illicit drugs  Current medications and supplements including opioid prescriptions. Patient is not currently taking opioid prescriptions. Functional ability and status Nutritional status Physical activity Advanced directives List of other physicians Hospitalizations, surgeries, and ER visits in previous 12 months Vitals Screenings to include cognitive, depression, and falls Referrals and appointments  In addition, I have reviewed and discussed with patient certain preventive protocols, quality metrics, and best practice recommendations. A written personalized care plan for preventive services as well as general preventive health recommendations were provided to patient.     Hal Hope, LPN   16/11/958   After Visit Summary: (MyChart) Due to this being a telephonic visit, the after visit summary with patients personalized plan was offered to patient via MyChart   Nurse Notes: none

## 2023-09-23 NOTE — Progress Notes (Unsigned)
Established patient visit   Patient: Jasmine Webster   DOB: 14-Jan-1926   89 y.o. Female  MRN: [id number] Visit Date: 09/24/2023  Today's healthcare provider: Ronnald Ramp, MD   No chief complaint on file.  Subjective       Discussed the use of AI scribe software for clinical note transcription with the patient, who gave verbal consent to proceed.  History of Present Illness             Past Medical History:  Diagnosis Date   Arthritis    Cataract    cataract removal bilaterally approx 20 years ago   Chronic back pain    COVID-19 virus infection 12/2020   Diabetes mellitus without complication (HCC)    GI bleed    Hyperlipidemia    Hypertension    Iron deficiency anemia    Non-obstructive CAD (coronary artery disease)    a. 04/2022 Cath: LM 10ost, LAD 30p, RI 60, LCX min irregs, LPAV min irregs, RCA min irregs.   Severe aortic stenosis    a. 05/2022 Echo: EF 55-60%, no rwma, mild LVH, GrII DD, nl RV fxn, mild MR, mild-mod MS, Sev AS (mean grad 33, peak grad 62, AVA 0.6cm^2).    Medications: Outpatient Medications Prior to Visit  Medication Sig   ALPRAZolam (XANAX) 0.5 MG tablet TAKE 1 TABLET BY MOUTH EVERY DAY AT BEDTIME AS NEEDED (Patient not taking: Reported on 09/23/2023)   Artificial Tear Ointment (DRY EYES OP) Apply 1 drop to eye daily.   ascorbic acid (VITAMIN C) 250 MG tablet Take 1 tablet (250 mg total) by mouth daily.   co-enzyme Q-10 30 MG capsule Take 30 mg by mouth daily.   cyanocobalamin (VITAMIN B12) 500 MCG tablet Take 1 tablet (500 mcg total) by mouth once a week.   ezetimibe (ZETIA) 10 MG tablet TAKE 1 TABLET BY MOUTH EVERY DAY   furosemide (LASIX) 20 MG tablet TAKE 1 TABLET BY MOUTH EVERY OTHER DAY   insulin glargine-yfgn (SEMGLEE, YFGN,) 100 UNIT/ML Pen INJECT 15 UNITS SUBCUTANEOUSLY   Insulin Pen Needle (B-D ULTRAFINE III SHORT PEN) 31G X 8 MM MISC Use for administering glucose twice daily   iron polysaccharides (NIFEREX) 150 MG  capsule TAKE 1 CAPSULE (150 MG TOTAL) BY MOUTH EVERY OTHER DAY   lisinopril (ZESTRIL) 10 MG tablet Take 1 tablet (10 mg total) by mouth daily.   Magnesium 400 MG CAPS Take 400 mg by mouth daily.   Multiple Vitamins-Minerals (PRESERVISION AREDS) CAPS Take 1 capsule by mouth 2 (two) times daily.    nitroGLYCERIN (NITROSTAT) 0.4 MG SL tablet Place 1 tablet (0.4 mg total) under the tongue every 5 (five) minutes as needed for chest pain.   Omega-3 Fatty Acids (FISH OIL PO) Take by mouth daily.   omeprazole (PRILOSEC) 40 MG capsule Take 40 mg by mouth daily.   ONETOUCH ULTRA test strip CHECK SUGAR TWICE A DAY   OneTouch UltraSoft 2 Lancets MISC USE AS INSTRUCTED   pantoprazole (PROTONIX) 40 MG tablet Take 1 tablet (40 mg total) by mouth 2 (two) times daily before a meal. (Patient not taking: Reported on 09/23/2023)   potassium chloride (KLOR-CON) 10 MEQ tablet TAKE 1 TABLET (10 MEQ TOTAL) BY MOUTH DAILY AS NEEDED. TAKE WITH FUROSEMIDE   silver sulfADIAZINE (SILVADENE) 1 % cream APPLY 1 APPLICATION TOPICALLY DAILY   Turmeric 500 MG CAPS Take by mouth daily.   Facility-Administered Medications Prior to Visit  Medication Dose Route Frequency Provider  0.9 %  sodium chloride infusion   Intravenous Continuous Rickard Patience, MD    Review of Systems  {Insert previous labs (optional):23779} {See past labs  Heme  Chem  Endocrine  Serology  Results Review (optional):1}   Objective    There were no vitals taken for this visit. {Insert last BP/Wt (optional):23777}{See vitals history (optional):1}      Physical Exam  ***  No results found for any visits on 09/24/23.  Assessment & Plan     Problem List Items Addressed This Visit   None   Assessment and Plan              No follow-ups on file.         Ronnald Ramp, MD  [facility] [phone] (phone) [phone] (fax)  [facility]

## 2023-09-23 NOTE — Patient Instructions (Addendum)
Jasmine Webster , Thank you for taking time to come for your Medicare Wellness Visit. I appreciate your ongoing commitment to your health goals. Please review the following plan we discussed and let me know if I can assist you in the future.   Referrals/Orders/Follow-Ups/Clinician Recommendations: none  This is a list of the screening recommended for you and due dates:  Health Maintenance  Topic Date Due   Zoster (Shingles) Vaccine (1 of 2) Never done   DTaP/Tdap/Td vaccine (2 - Tdap) 03/29/2020   Complete foot exam   12/20/2022   COVID-19 Vaccine (4 - 2023-24 season) 07/20/2023   Hemoglobin A1C  10/04/2023   Eye exam for diabetics  07/22/2024   Medicare Annual Wellness Visit  09/22/2024   Pneumonia Vaccine  Completed   Flu Shot  Completed   DEXA scan (bone density measurement)  Completed   HPV Vaccine  Aged Out    Advanced directives: (ACP Link)Information on Advanced Care Planning can be found at [facility] [place] Advance Health Care Directives Advance Health Care Directives ([email or web address]   Next Medicare Annual Wellness Visit scheduled for next year: Yes   09/28/24 @ 11:25 am in person

## 2023-09-24 ENCOUNTER — Ambulatory Visit (INDEPENDENT_AMBULATORY_CARE_PROVIDER_SITE_OTHER): Payer: PPO | Admitting: Family Medicine

## 2023-09-24 ENCOUNTER — Encounter: Payer: Self-pay | Admitting: Family Medicine

## 2023-09-24 VITALS — BP 128/51 | HR 77 | Ht 64.0 in | Wt 150.0 lb

## 2023-09-24 DIAGNOSIS — R531 Weakness: Secondary | ICD-10-CM

## 2023-09-24 DIAGNOSIS — N1832 Chronic kidney disease, stage 3b: Secondary | ICD-10-CM

## 2023-09-24 DIAGNOSIS — M791 Myalgia, unspecified site: Secondary | ICD-10-CM

## 2023-09-24 DIAGNOSIS — I5031 Acute diastolic (congestive) heart failure: Secondary | ICD-10-CM | POA: Diagnosis not present

## 2023-09-24 DIAGNOSIS — E1122 Type 2 diabetes mellitus with diabetic chronic kidney disease: Secondary | ICD-10-CM

## 2023-09-24 DIAGNOSIS — J309 Allergic rhinitis, unspecified: Secondary | ICD-10-CM | POA: Insufficient documentation

## 2023-09-24 DIAGNOSIS — J3089 Other allergic rhinitis: Secondary | ICD-10-CM

## 2023-09-24 DIAGNOSIS — Z794 Long term (current) use of insulin: Secondary | ICD-10-CM

## 2023-09-24 MED ORDER — FLUTICASONE PROPIONATE 50 MCG/ACT NA SUSP
2.0000 | Freq: Every day | NASAL | 6 refills | Status: DC
  Filled 2024-01-02 – 2024-01-21 (×2): qty 16, 50d supply, fill #0

## 2023-09-24 MED ORDER — CETIRIZINE HCL 10 MG PO TABS: 10.0000 mg | ORAL_TABLET | Freq: Every day | ORAL | 11 refills | Status: DC

## 2023-09-24 NOTE — Assessment & Plan Note (Signed)
  Reports of persistent mucus production and coughing. No signs of infection or respiratory distress on examination. -Prescribe Cetirizine and Flonase nasal spray. -Recommend Debrox drops for wax in right ear.

## 2023-09-24 NOTE — Assessment & Plan Note (Signed)
Reports of increased fatigue and decreased exercise tolerance. No chest pain, lightheadedness, or edema reported. Currently on Lasix 20mg  and Lisinopril 10mg . -Consider cardiology consultation for symptom management.

## 2023-09-29 ENCOUNTER — Ambulatory Visit: Payer: PPO | Admitting: Student

## 2023-10-01 NOTE — Progress Notes (Unsigned)
Cardiology Clinic Note   Date: 10/02/2023 ID: Jasmine Webster, DOB 01/24/1926, MRN [id number]  Primary Cardiologist:  Julien Nordmann, MD  Patient Profile    Jasmine Webster is a 89 y.o. female who presents to the clinic today for evaluation of fatigue and activity intolerance.     Past medical history significant for: Nonobstructive CAD. R/LHC 04/26/2022: Ostial LM 10%.  Ramus 60%.  Proximal to mid LAD 30%.  Chronic diastolic heart failure. Echo 05/22/2022: EF 55 to 60%.  No RWMA.  Mild LVH.  Grade II DD.  Normal RV size/function.  Mild MR.  Mild to moderate mitral stenosis, mean gradient 6.0 mmHg.  Moderate to severe MAC.  Severe aortic valve stenosis, mean gradient 33 mmHg. Aortic valve stenosis. R/LHC 04/26/2022: Normal RA pressure, moderately elevated wedge pressure, severe pulmonary hypertension and normal cardiac output.  Prominent V waves on pulmonary wedge tracing suggestive of mitral regurgitation. Severe aortic stenosis with mean gradient of 40 mmHg.  RA: 2 mmHg, RV: 65/2 mmHg, PA: 68/27 with a mean of 46 mmHg, PCW: Mean pressure of 26 mmHg with prominent V waves at 38 mmHg, LVEDP: 30 mmHg, Cardiac output: 7.5 with an index of 4.3. Hypertension. Hyperlipidemia. Lipid panel 10/02/2022: LDL 141, HDL 46, TG 237, total 230. GERD. T2DM. CKD stage IIIb. Iron deficiency anemia.  In summary, followed for several years for aortic valve disease.  In June 2023, she complained of chest tightness and DOE.  R/LHC showed moderate nonobstructive CAD with severe pulmonary hypertension and severe aortic valve stenosis (details above).  She was evaluated by the structural heart team in June 2023 with plan for further TAVR evaluation.  In July 2023 she underwent hospital admission for symptomatic anemia with a hemoglobin of 6 requiring transfusion of 2 units PRBC.  Troponin was 4000 felt to be demand ischemia.  GI evaluation with video capsule endoscopy with no significant findings and EGD/colonoscopy  were deferred secondary to comorbid conditions.  She completed TAVR evaluation and was felt to not have suitable anatomy for TAVR.     History of Present Illness    Jasmine Webster is followed by Dr. Mariah Milling for the above outlined history.  Last seen in the office on 03/07/2023 by Ward Givens, NP for progressive DOE.  She noted a 2-day history of dyspnea with minimal activity.  She had stopped taking Lasix earlier that week.  She appeared euvolemic on exam but it was felt her symptoms were related to mild volume overload.  She was encouraged to resume previously scheduled dose of Lasix at 3 times a week.  She was educated on the natural progression of symptoms related to severe aortic stenosis.  H&H at the time of her visit were normal.  Seen by PCP on 09/24/2023 with reports of increased fatigue and decreased exercise tolerance.  She was instructed to follow-up with cardiology.  Labs from 09/17/2023 showed hemoglobin 10.9, hematocrit 34.3.   Discussed the use of AI scribe software for clinical note transcription with the patient, who gave verbal consent to proceed.  The patient is accompanied by her twin nieces. She reports she was seen by her PCP because she had a cold with persistent cough. Her PCP was concerned the cough which was associated with mucus production could be indicative of fluid overload. She started using Flonase and the cough and congestion resolved. She denies peripheral edema, abdominal distension, or early satiety. The patient reports continued activity intolerance. She is aware of the course of her aortic stenosis  and did not feel she needed to follow-up with cardiology right now but was following her doctor's recommendation to be seen.          ROS: All other systems reviewed and are otherwise negative except as noted in History of Present Illness.  Studies Reviewed    EKG Interpretation Date/Time:  Thursday October 02 2023 15:28:58 EST Ventricular Rate:  83 PR  Interval:  172 QRS Duration:  130 QT Interval:  410 QTC Calculation: 481 R Axis:   -46  Text Interpretation: Normal sinus rhythm Left axis deviation Left bundle branch block When compared with ECG of 25-Nov-2022 12:28, No significant change was found Confirmed by Carlos Levering [phone]) on 10/02/2023 3:36:05 PM           Physical Exam    VS:  BP 115/71 (BP Location: Left Arm, Patient Position: Sitting, Cuff Size: Normal)   Pulse 83   Ht 5\' 4"  (1.626 m)   Wt 152 lb 9.6 oz (69.2 kg)   SpO2 97%   BMI 26.19 kg/m  , BMI Body mass index is 26.19 kg/m.  GEN: Well nourished, well developed, in no acute distress. Neck: No JVD or carotid bruits. Cardiac:  RRR. No murmurs. No rubs or gallops.   Respiratory:  Respirations regular and unlabored. Clear to auscultation without rales, wheezing or rhonchi. GI: Soft, nontender, nondistended. Extremities: Radials/DP/PT 2+ and equal bilaterally. No clubbing or cyanosis. No edema. Slight swelling of left ankle (chronic).  Skin: Warm and dry, no rash. Neuro: Strength intact.  Assessment & Plan   Assessment and Plan    Nonobstructive CAD Angiography June 2023 showed ostial LM 10%, ramus 60%, proximal to mid LAD 30%.  Patient denies chest pain, pressure or tightness.  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  Severe aortic stenosis/DOE/pulmonary hypertension Echo July 2023 showed EF 55 to 60%, no RWMA, mild LVH, Grade II DD, mild to moderate mitral stenosis, severe aortic stenosis.  Patient denies lower extremity edema, shortness of breath, DOE, abdominal bloating or early satiety. She reports activity intolerance somewhat worse than when she was seen in April.  Euvolemic and well compensated on exam. -Discussed expected course of aortic stenosis. Patient and family verbalized understanding.   Hypertension BP today 115/71.  -Continue lisinopril.  Hyperlipidemia LDL November 2023 141, not at goal.  Patient is intolerant to  statins. -Continue Zetia. -Followed by PCP.         Disposition: Return in 3 months or sooner as needed.          Signed, Etta Grandchild. Teoman Giraud, DNP, NP-C

## 2023-10-02 ENCOUNTER — Ambulatory Visit: Payer: PPO | Attending: Student | Admitting: Student

## 2023-10-02 ENCOUNTER — Encounter: Payer: Self-pay | Admitting: Student

## 2023-10-02 VITALS — BP 115/71 | HR 83 | Ht 64.0 in | Wt 152.6 lb

## 2023-10-02 DIAGNOSIS — R0609 Other forms of dyspnea: Secondary | ICD-10-CM

## 2023-10-02 DIAGNOSIS — I2510 Atherosclerotic heart disease of native coronary artery without angina pectoris: Secondary | ICD-10-CM

## 2023-10-02 DIAGNOSIS — I10 Essential (primary) hypertension: Secondary | ICD-10-CM

## 2023-10-02 DIAGNOSIS — E782 Mixed hyperlipidemia: Secondary | ICD-10-CM | POA: Diagnosis not present

## 2023-10-02 DIAGNOSIS — I350 Nonrheumatic aortic (valve) stenosis: Secondary | ICD-10-CM

## 2023-10-02 DIAGNOSIS — I272 Pulmonary hypertension, unspecified: Secondary | ICD-10-CM

## 2023-10-02 NOTE — Patient Instructions (Signed)
Medication Instructions:  No changes *If you need a refill on your cardiac medications before your next appointment, please call your pharmacy*   Lab Work: None ordered If you have labs (blood work) drawn today and your tests are completely normal, you will receive your results only by: MyChart Message (if you have MyChart) OR A paper copy in the mail If you have any lab test that is abnormal or we need to change your treatment, we will call you to review the results.   Testing/Procedures: None ordered   Follow-Up: At [facility], you and your health needs are our priority.  As part of our continuing mission to provide you with exceptional heart care, we have created designated Provider Care Teams.  These Care Teams include your primary Cardiologist (physician) and Advanced Practice Providers (APPs -  Physician Assistants and Nurse Practitioners) who all work together to provide you with the care you need, when you need it.  We recommend signing up for the patient portal called "MyChart".  Sign up information is provided on this After Visit Summary.  MyChart is used to connect with patients for Virtual Visits (Telemedicine).  Patients are able to view lab/test results, encounter notes, upcoming appointments, etc.  Non-urgent messages can be sent to your provider as well.   To learn more about what you can do with MyChart, go to [email or web address].    Your next appointment:   3 month(s)  Provider:   You may see Julien Nordmann, MD or one of the following Advanced Practice Providers on your designated Care Team:   Carlos Levering, NP

## 2023-10-06 ENCOUNTER — Ambulatory Visit: Payer: PPO | Admitting: Family Medicine

## 2023-10-13 ENCOUNTER — Other Ambulatory Visit: Payer: Self-pay | Admitting: Family Medicine

## 2023-10-20 ENCOUNTER — Other Ambulatory Visit (HOSPITAL_BASED_OUTPATIENT_CLINIC_OR_DEPARTMENT_OTHER): Payer: Self-pay | Admitting: Family Medicine

## 2023-10-28 ENCOUNTER — Other Ambulatory Visit: Payer: Self-pay | Admitting: Family Medicine

## 2023-10-28 DIAGNOSIS — E1122 Type 2 diabetes mellitus with diabetic chronic kidney disease: Secondary | ICD-10-CM

## 2023-10-29 ENCOUNTER — Inpatient Hospital Stay (HOSPITAL_BASED_OUTPATIENT_CLINIC_OR_DEPARTMENT_OTHER): Payer: PPO | Admitting: Oncology

## 2023-10-29 ENCOUNTER — Other Ambulatory Visit: Payer: Self-pay

## 2023-10-29 ENCOUNTER — Inpatient Hospital Stay: Payer: PPO

## 2023-10-29 ENCOUNTER — Encounter: Payer: Self-pay | Admitting: Oncology

## 2023-10-29 ENCOUNTER — Inpatient Hospital Stay: Payer: PPO | Attending: Oncology

## 2023-10-29 VITALS — BP 147/59 | HR 79 | Temp 97.7°F | Resp 18

## 2023-10-29 VITALS — BP 138/49 | HR 75 | Temp 98.9°F | Resp 19

## 2023-10-29 DIAGNOSIS — I350 Nonrheumatic aortic (valve) stenosis: Secondary | ICD-10-CM | POA: Diagnosis not present

## 2023-10-29 DIAGNOSIS — N189 Chronic kidney disease, unspecified: Secondary | ICD-10-CM | POA: Diagnosis not present

## 2023-10-29 DIAGNOSIS — D631 Anemia in chronic kidney disease: Secondary | ICD-10-CM | POA: Diagnosis not present

## 2023-10-29 DIAGNOSIS — I252 Old myocardial infarction: Secondary | ICD-10-CM | POA: Insufficient documentation

## 2023-10-29 DIAGNOSIS — D500 Iron deficiency anemia secondary to blood loss (chronic): Secondary | ICD-10-CM | POA: Diagnosis not present

## 2023-10-29 DIAGNOSIS — Z7982 Long term (current) use of aspirin: Secondary | ICD-10-CM | POA: Insufficient documentation

## 2023-10-29 DIAGNOSIS — Z801 Family history of malignant neoplasm of trachea, bronchus and lung: Secondary | ICD-10-CM | POA: Diagnosis not present

## 2023-10-29 DIAGNOSIS — E611 Iron deficiency: Secondary | ICD-10-CM | POA: Insufficient documentation

## 2023-10-29 DIAGNOSIS — N183 Chronic kidney disease, stage 3 unspecified: Secondary | ICD-10-CM

## 2023-10-29 LAB — IRON AND TIBC
Iron: 40 ug/dL (ref 28–170)
Saturation Ratios: 13 % (ref 10.4–31.8)
TIBC: 301 ug/dL (ref 250–450)
UIBC: 261 ug/dL

## 2023-10-29 LAB — FERRITIN: Ferritin: 46 ng/mL (ref 11–307)

## 2023-10-29 LAB — HEPATIC FUNCTION PANEL
ALT: 13 U/L (ref 0–44)
AST: 23 U/L (ref 15–41)
Albumin: 3.2 g/dL — ABNORMAL LOW (ref 3.5–5.0)
Alkaline Phosphatase: 99 U/L (ref 38–126)
Bilirubin, Direct: <0.1 mg/dL (ref 0.0–0.2)
Total Bilirubin: 0.8 mg/dL (ref <1.2)
Total Protein: 5.8 g/dL — ABNORMAL LOW (ref 6.5–8.1)

## 2023-10-29 LAB — CBC WITH DIFFERENTIAL (CANCER CENTER ONLY)
Abs Immature Granulocytes: 0.11 10*3/uL — ABNORMAL HIGH (ref 0.00–0.07)
Basophils Absolute: 0.1 10*3/uL (ref 0.0–0.1)
Basophils Relative: 1 %
Eosinophils Absolute: 0.1 10*3/uL (ref 0.0–0.5)
Eosinophils Relative: 2 %
HCT: 29 % — ABNORMAL LOW (ref 36.0–46.0)
Hemoglobin: 8.9 g/dL — ABNORMAL LOW (ref 12.0–15.0)
Immature Granulocytes: 2 %
Lymphocytes Relative: 18 %
Lymphs Abs: 1.3 10*3/uL (ref 0.7–4.0)
MCH: 30.8 pg (ref 26.0–34.0)
MCHC: 30.7 g/dL (ref 30.0–36.0)
MCV: 100.3 fL — ABNORMAL HIGH (ref 80.0–100.0)
Monocytes Absolute: 0.9 10*3/uL (ref 0.1–1.0)
Monocytes Relative: 12 %
Neutro Abs: 4.8 10*3/uL (ref 1.7–7.7)
Neutrophils Relative %: 65 %
Platelet Count: 254 10*3/uL (ref 150–400)
RBC: 2.89 MIL/uL — ABNORMAL LOW (ref 3.87–5.11)
RDW: 13.5 % (ref 11.5–15.5)
WBC Count: 7.2 10*3/uL (ref 4.0–10.5)
nRBC: 0 % (ref 0.0–0.2)

## 2023-10-29 LAB — TECHNOLOGIST SMEAR REVIEW: Plt Morphology: UNDETERMINED

## 2023-10-29 LAB — RETIC PANEL
Immature Retic Fract: 30 % — ABNORMAL HIGH (ref 2.3–15.9)
RBC.: 2.85 MIL/uL — ABNORMAL LOW (ref 3.87–5.11)
Retic Count, Absolute: 91.8 10*3/uL (ref 19.0–186.0)
Retic Ct Pct: 3.2 % — ABNORMAL HIGH (ref 0.4–3.1)
Reticulocyte Hemoglobin: 29.5 pg (ref >27.9)

## 2023-10-29 MED ORDER — SODIUM CHLORIDE 0.9 % IV SOLN
200.0000 mg | Freq: Once | INTRAVENOUS | Status: DC
Start: 2023-10-29 — End: 2023-10-29
  Filled 2023-10-29: qty 10

## 2023-10-29 MED ORDER — IRON SUCROSE 20 MG/ML IV SOLN
200.0000 mg | Freq: Once | INTRAVENOUS | Status: AC
  Administered 2023-10-29: 200 mg via INTRAVENOUS

## 2023-10-29 MED ORDER — SODIUM CHLORIDE 0.9% FLUSH
10.0000 mL | Freq: Once | INTRAVENOUS | Status: AC | PRN
  Administered 2023-10-29: 10 mL
  Filled 2023-10-29: qty 10

## 2023-10-29 NOTE — Addendum Note (Signed)
Addended by: Rickard Patience on: 10/29/2023 08:17 PM   Modules accepted: Orders

## 2023-10-29 NOTE — Assessment & Plan Note (Signed)
anemia secondary to chronic kidney disease, IV Venofer treatments

## 2023-10-29 NOTE — Progress Notes (Signed)
Pt here for labs and was added to MD schedule due to complaints of shortness of breath, weakness, and fatigue.

## 2023-10-29 NOTE — Progress Notes (Signed)
Hematology/Oncology Progress note [phone] [phone]         Patient Care Team: Ronnald Ramp, MD as PCP - General (Family Medicine) Antonieta Iba, MD as PCP - Cardiology (Cardiology) Galen Manila, MD as Referring Physician (Ophthalmology) Helane Gunther, DPM as Consulting Physician (Podiatry) Mariah Milling, Tollie Pizza, MD as Consulting Physician (Cardiology) Rickard Patience, MD as Consulting Physician (Oncology)    CHIEF COMPLAINTS/REASON FOR VISIT:  Anemia  ASSESSMENT & PLAN:  Iron deficiency anemia Labs are reviewed and discussed with patient. Both hemoglobin and iron panel have improved. Lab Results  Component Value Date   HGB 8.9 (L) 10/29/2023   TIBC 301 10/29/2023   IRONPCTSAT 13 10/29/2023   FERRITIN 46 10/29/2023    Hemoglobin has dropped to 8.9.  Iron panel showed borderline low iron saturation.  Ferritin is less than 200. Recommend IV Venofer weekly x 4 to further improve iron store. After that continue oral iron supplementation, Niferex 150 mg every other day.  It is possible that she has occult GI bleeding.  She is at risk of developing AVM due to aortic stenosis. Previously discussed with GI, anesthetic risk is high for EGD/colonoscopy locally.  If patient develops severe recurrent IDA, she may need to go to tertiary center for further evaluation.  Anemia in chronic kidney disease (CKD) anemia secondary to chronic kidney disease, IV Venofer treatments  Orders Placed This Encounter  Procedures   CBC with Differential (Cancer Center Only)    Standing Status:   Future    Standing Expiration Date:   10/28/2024   Retic Panel    Standing Status:   Future    Standing Expiration Date:   10/28/2024   Sample to Blood Bank    Standing Status:   Future    Standing Expiration Date:   10/28/2024   Follow-up 6 weeks All questions were answered. The patient knows to call the clinic with any problems, questions or concerns.  Rickard Patience, MD, PhD [facility] Hematology Oncology 10/29/2023     HISTORY OF PRESENTING ILLNESS:  Jasmine Webster is a  89 y.o.  female with PMH listed below who was referred to me for anemia Reviewed patient's recent labs that was done.   Reviewed patient's previous labs ordered by primary care physician's office, anemia is chronic onset , duration is since January 2023. Patient reports very fatigued, lightheaded, shortness of breath with minimal exertion. She denies recent chest pain on exertion, pre-syncopal episodes, or palpitations She had not noticed any recent bleeding such as epistaxis, hematuria or hematochezia.  +Dark stool She denies over the counter NSAID ingestion. She is on antiplatelets agent-aspirin 81 mg. She denies any pica and eats a variety of diet.  She was hospitalized from 05/21/2022 - 05/24/2022, due to NSTEMI and acute CHF exacerbation, symptomatic anemia, status post 2 unit of PRBC transfusion.  Patient reports feeling better after blood transfusion. Cardiology recommends no invasive intervention during this admission.  There is plan for possible TAVR   INTERVAL HISTORY Jasmine Webster is a 89 y.o. female who has above history reviewed by me today presents for acute visit for iron deficiency anemia Patient is accompanied by her niece.   Patient reports very fatigued.  She was seen as an acute visit. Denies any blood in the stool.  She takes Niferex every other day.  MEDICAL HISTORY:  Past Medical History:  Diagnosis Date   Arthritis    Cataract    cataract removal bilaterally approx 20 years ago  Chronic back pain    COVID-19 virus infection 12/2020   Diabetes mellitus without complication (HCC)    GI bleed    Hyperlipidemia    Hypertension    Iron deficiency anemia    Non-obstructive CAD (coronary artery disease)    a. 04/2022 Cath: LM 10ost, LAD 30p, RI 60, LCX min irregs, LPAV min irregs, RCA min irregs.   Severe aortic stenosis    a. 05/2022 Echo: EF  55-60%, no rwma, mild LVH, GrII DD, nl RV fxn, mild MR, mild-mod MS, Sev AS (mean grad 33, peak grad 62, AVA 0.6cm^2).    SURGICAL HISTORY: Past Surgical History:  Procedure Laterality Date   ABDOMINAL HYSTERECTOMY     APPENDECTOMY     BACK SURGERY     three   CARDIAC CATHETERIZATION     6+yrs no stents Dr. Vanita Panda   CATARACT EXTRACTION     RIGHT/LEFT HEART CATH AND CORONARY ANGIOGRAPHY N/A 04/26/2022   Procedure: RIGHT/LEFT HEART CATH AND CORONARY ANGIOGRAPHY;  Surgeon: Iran Ouch, MD;  Location: [facility] INVASIVE CV LAB;  Service: Cardiovascular;  Laterality: N/A;   THORACOLUMBAR SYRINGO SHUNT     TONSILLECTOMY AND ADENOIDECTOMY      SOCIAL HISTORY: Social History   Socioeconomic History   Marital status: Widowed    Spouse name: Not on file   Number of children: 2   Years of education: Not on file   Highest education level: 8th grade  Occupational History   Occupation: retired  Tobacco Use   Smoking status: Never   Smokeless tobacco: Never  Vaping Use   Vaping status: Never Used  Substance and Sexual Activity   Alcohol use: No    Alcohol/week: 0.0 standard drinks of alcohol   Drug use: No   Sexual activity: Not Currently  Other Topics Concern   Not on file  Social History Narrative   Lives with daughter, [name]   Social Determinants of Health   Financial Resource Strain: Low Risk  (09/23/2023)   Overall Financial Resource Strain (CARDIA)    Difficulty of Paying Living Expenses: Not hard at all  Food Insecurity: No Food Insecurity (09/23/2023)   Hunger Vital Sign    Worried About Running Out of Food in the Last Year: Never true    Ran Out of Food in the Last Year: Never true  Transportation Needs: No Transportation Needs (09/23/2023)   PRAPARE - Administrator, Civil Service (Medical): No    Lack of Transportation (Non-Medical): No  Physical Activity: Inactive (09/23/2023)   Exercise Vital Sign    Days of Exercise per Week: 0 days     Minutes of Exercise per Session: 0 min  Stress: No Stress Concern Present (09/23/2023)   [employer] of Occupational Health - Occupational Stress Questionnaire    Feeling of Stress : Not at all  Social Connections: Moderately Isolated (09/23/2023)   Social Connection and Isolation Panel [NHANES]    Frequency of Communication with Friends and Family: More than three times a week    Frequency of Social Gatherings with Friends and Family: More than three times a week    Attends Religious Services: More than 4 times per year    Active Member of [employer] or Organizations: No    Attends Banker Meetings: Never    Marital Status: Widowed  Intimate Partner Violence: Not At Risk (09/23/2023)   Humiliation, Afraid, Rape, and Kick questionnaire    Fear of Current or Ex-Partner: No  Emotionally Abused: No    Physically Abused: No    Sexually Abused: No    FAMILY HISTORY: Family History  Problem Relation Age of Onset   Heart disease Mother        died from MI   Heart attack Mother    Heart disease Sister    Sudden death Brother        shot and beaten to death during home invasion   Stroke Sister    Sudden death Sister        MVA   Cancer Brother        died from lung cancer   Sudden death Sister        6 days old   Cancer Brother        died from lung cancer   Stroke Brother        cause of death   Diabetes Father    Stroke Father    Pneumonia Father     ALLERGIES:  is allergic to etodolac, naproxen, statins, latex, terbinafine, and terbinafine hcl.  MEDICATIONS:  Current Outpatient Medications  Medication Sig Dispense Refill   Artificial Tear Ointment (DRY EYES OP) Apply 1 drop to eye daily.     ascorbic acid (VITAMIN C) 250 MG tablet Take 1 tablet (250 mg total) by mouth daily. 30 tablet 1   cetirizine (ZYRTEC) 10 MG tablet Take 1 tablet (10 mg total) by mouth daily. 30 tablet 11   co-enzyme Q-10 30 MG capsule Take 30 mg by mouth daily.     cyanocobalamin  (VITAMIN B12) 500 MCG tablet Take 1 tablet (500 mcg total) by mouth once a week. 30 tablet 1   ezetimibe (ZETIA) 10 MG tablet TAKE 1 TABLET BY MOUTH EVERY DAY 90 tablet 3   fluticasone (FLONASE) 50 MCG/ACT nasal spray Place 2 sprays into both nostrils daily. 16 g 6   furosemide (LASIX) 20 MG tablet TAKE 1 TABLET BY MOUTH EVERY OTHER DAY 45 tablet 1   insulin glargine-yfgn (SEMGLEE, YFGN,) 100 UNIT/ML Pen INJECT 15 UNITS SUBCUTANEOUSLY     Insulin Pen Needle (B-D ULTRAFINE III SHORT PEN) 31G X 8 MM MISC CHECK SUGAR 2 TIMES DAILY, DX E11.9 100 each 12   iron polysaccharides (NIFEREX) 150 MG capsule TAKE 1 CAPSULE (150 MG TOTAL) BY MOUTH EVERY OTHER DAY 45 capsule 1   lisinopril (ZESTRIL) 10 MG tablet Take 1 tablet (10 mg total) by mouth daily. 30 tablet 5   Magnesium 400 MG CAPS Take 400 mg by mouth daily.     Multiple Vitamins-Minerals (PRESERVISION AREDS) CAPS Take 1 capsule by mouth 2 (two) times daily.      Omega-3 Fatty Acids (FISH OIL PO) Take by mouth daily.     omeprazole (PRILOSEC) 40 MG capsule TAKE 1 CAPSULE (40 MG TOTAL) BY MOUTH DAILY. 90 capsule 3   ONETOUCH ULTRA test strip CHECK SUGAR TWICE A DAY 200 strip 3   OneTouch UltraSoft 2 Lancets MISC USE AS INSTRUCTED 100 each 12   pantoprazole (PROTONIX) 40 MG tablet Take 1 tablet (40 mg total) by mouth 2 (two) times daily before a meal. 60 tablet 3   potassium chloride (KLOR-CON) 10 MEQ tablet TAKE 1 TABLET (10 MEQ TOTAL) BY MOUTH DAILY AS NEEDED. TAKE WITH FUROSEMIDE 90 tablet 1   silver sulfADIAZINE (SILVADENE) 1 % cream APPLY 1 APPLICATION TOPICALLY DAILY 50 g 0   Turmeric 500 MG CAPS Take by mouth daily.     nitroGLYCERIN (NITROSTAT) 0.4 MG SL  tablet Place 1 tablet (0.4 mg total) under the tongue every 5 (five) minutes as needed for chest pain. (Patient not taking: Reported on 10/29/2023) 25 tablet 3   No current facility-administered medications for this visit.   Facility-Administered Medications Ordered in Other Visits   Medication Dose Route Frequency Provider Last Rate Last Admin   0.9 %  sodium chloride infusion   Intravenous Continuous Rickard Patience, MD 20 mL/hr at 12/11/22 1458 New Bag at 12/11/22 1458    Review of Systems  Constitutional:  Positive for fatigue. Negative for chills and fever.  HENT:   Negative for hearing loss and voice change.   Eyes:  Negative for eye problems.  Respiratory:  Negative for chest tightness, cough and shortness of breath.   Cardiovascular:  Negative for chest pain.  Gastrointestinal:  Negative for abdominal distention and abdominal pain.       Dark stool  Endocrine: Negative for hot flashes.  Genitourinary:  Negative for difficulty urinating and frequency.   Musculoskeletal:  Negative for arthralgias.  Skin:  Negative for itching and rash.  Neurological:  Negative for extremity weakness and light-headedness.  Hematological:  Negative for adenopathy.  Psychiatric/Behavioral:  Negative for confusion.     PHYSICAL EXAMINATION: ECOG PERFORMANCE STATUS: 2 - Symptomatic, <50% confined to bed Vitals:   10/29/23 1130  BP: (!) 147/59  Pulse: 79  Resp: 18  Temp: 97.7 F (36.5 C)  SpO2: 100%   Physical Exam Constitutional:      General: She is not in acute distress.    Comments: Patient sits in a wheelchair  HENT:     Head: Normocephalic and atraumatic.  Eyes:     General: No scleral icterus. Cardiovascular:     Rate and Rhythm: Normal rate and regular rhythm.     Heart sounds: Murmur heard.  Pulmonary:     Effort: Pulmonary effort is normal. No respiratory distress.     Breath sounds: No wheezing.  Abdominal:     General: Bowel sounds are normal. There is no distension.     Palpations: Abdomen is soft.  Musculoskeletal:        General: No deformity. Normal range of motion.     Cervical back: Normal range of motion and neck supple.     Comments: Trace edema bilaterally  Skin:    General: Skin is warm and dry.     Coloration: Skin is pale.     Findings:  No erythema or rash.  Neurological:     Mental Status: She is alert and oriented to person, place, and time. Mental status is at baseline.     Cranial Nerves: No cranial nerve deficit.  Psychiatric:        Mood and Affect: Mood normal.      LABORATORY DATA:  I have reviewed the data as listed    Latest Ref Rng & Units 10/29/2023   10:49 AM 09/17/2023   11:30 AM 05/01/2023   11:32 AM  CBC  WBC 4.0 - 10.5 K/uL 7.2  6.1  5.7   Hemoglobin 12.0 - 15.0 g/dL 8.9  13.0  86.5   Hematocrit 36.0 - 46.0 % 29.0  34.3  34.6   Platelets 150 - 400 K/uL 254  CANCELED  177       Latest Ref Rng & Units 10/29/2023   10:49 AM 03/07/2023    2:51 PM 11/27/2022    3:17 AM  CMP  Glucose 70 - 99 mg/dL  784  696  BUN 8 - 23 mg/dL  18  18   Creatinine 9.62 - 1.00 mg/dL  9.52  8.41   Sodium 324 - 145 mmol/L  134  137   Potassium 3.5 - 5.1 mmol/L  4.2  3.8   Chloride 98 - 111 mmol/L  100  107   CO2 22 - 32 mmol/L  26  25   Calcium 8.9 - 10.3 mg/dL  8.7  8.2   Total Protein 6.5 - 8.1 g/dL 5.8     Total Bilirubin <1.2 mg/dL 0.8     Alkaline Phos 38 - 126 U/L 99     AST 15 - 41 U/L 23     ALT 0 - 44 U/L 13         Component Value Date/Time   IRON 40 10/29/2023 1049   IRON 36 12/04/2021 1302   TIBC 301 10/29/2023 1049   TIBC 327 12/04/2021 1302   FERRITIN 46 10/29/2023 1049   FERRITIN 115 10/02/2022 1305   IRONPCTSAT 13 10/29/2023 1049   IRONPCTSAT 11 (L) 12/04/2021 1302     RADIOGRAPHIC STUDIES: I have personally reviewed the radiological images as listed and agreed with the findings in the report. No results found.

## 2023-10-29 NOTE — Assessment & Plan Note (Addendum)
Labs are reviewed and discussed with patient. Both hemoglobin and iron panel have improved. Lab Results  Component Value Date   HGB 8.9 (L) 10/29/2023   TIBC 301 10/29/2023   IRONPCTSAT 13 10/29/2023   FERRITIN 46 10/29/2023    Hemoglobin has dropped to 8.9.  Iron panel showed borderline low iron saturation.  Ferritin is less than 200. Recommend IV Venofer weekly x 4 to further improve iron store. After that continue oral iron supplementation, Niferex 150 mg every other day.  It is possible that she has occult GI bleeding.  She is at risk of developing AVM due to aortic stenosis. Previously discussed with GI, anesthetic risk is high for EGD/colonoscopy locally.  If patient develops severe recurrent IDA, she may need to go to tertiary center for further evaluation.

## 2023-10-30 ENCOUNTER — Telehealth: Payer: Self-pay

## 2023-10-30 NOTE — Telephone Encounter (Signed)
-----   Message from Rickard Patience sent at 10/29/2023  8:08 PM EST ----- Please schedule additional Venofer weekly x 2 [start on week of 11/10/2023] Keep scheduled Venofer on 11/05/2023. Please arrange patient to follow-up in 6 weeks lab prior to MD +/- Venofer +/- Retacrit. Labs are ordered.  Thank you

## 2023-10-30 NOTE — Telephone Encounter (Signed)
Jasmine Webster will you please schedule and notify patient of appointment dates and times. Thank you!

## 2023-11-05 ENCOUNTER — Inpatient Hospital Stay: Payer: PPO

## 2023-11-05 ENCOUNTER — Other Ambulatory Visit: Payer: PPO

## 2023-11-05 ENCOUNTER — Ambulatory Visit: Payer: PPO | Admitting: Oncology

## 2023-11-05 VITALS — BP 142/59 | HR 77 | Temp 96.8°F | Resp 18

## 2023-11-05 DIAGNOSIS — N189 Chronic kidney disease, unspecified: Secondary | ICD-10-CM | POA: Diagnosis not present

## 2023-11-05 DIAGNOSIS — D500 Iron deficiency anemia secondary to blood loss (chronic): Secondary | ICD-10-CM

## 2023-11-05 LAB — SAMPLE TO BLOOD BANK

## 2023-11-05 LAB — CBC WITH DIFFERENTIAL (CANCER CENTER ONLY)
Abs Immature Granulocytes: 0.12 10*3/uL — ABNORMAL HIGH (ref 0.00–0.07)
Basophils Absolute: 0.1 10*3/uL (ref 0.0–0.1)
Basophils Relative: 1 %
Eosinophils Absolute: 0.1 10*3/uL (ref 0.0–0.5)
Eosinophils Relative: 2 %
HCT: 31.6 % — ABNORMAL LOW (ref 36.0–46.0)
Hemoglobin: 9.7 g/dL — ABNORMAL LOW (ref 12.0–15.0)
Immature Granulocytes: 2 %
Lymphocytes Relative: 24 %
Lymphs Abs: 1.8 10*3/uL (ref 0.7–4.0)
MCH: 30.7 pg (ref 26.0–34.0)
MCHC: 30.7 g/dL (ref 30.0–36.0)
MCV: 100 fL (ref 80.0–100.0)
Monocytes Absolute: 0.8 10*3/uL (ref 0.1–1.0)
Monocytes Relative: 11 %
Neutro Abs: 4.7 10*3/uL (ref 1.7–7.7)
Neutrophils Relative %: 60 %
Platelet Count: 282 10*3/uL (ref 150–400)
RBC: 3.16 MIL/uL — ABNORMAL LOW (ref 3.87–5.11)
RDW: 14.1 % (ref 11.5–15.5)
WBC Count: 7.7 10*3/uL (ref 4.0–10.5)
nRBC: 0 % (ref 0.0–0.2)

## 2023-11-05 LAB — RETIC PANEL
Immature Retic Fract: 33.4 % — ABNORMAL HIGH (ref 2.3–15.9)
RBC.: 3.11 MIL/uL — ABNORMAL LOW (ref 3.87–5.11)
Retic Count, Absolute: 128.4 10*3/uL (ref 19.0–186.0)
Retic Ct Pct: 4.1 % — ABNORMAL HIGH (ref 0.4–3.1)
Reticulocyte Hemoglobin: 31.2 pg (ref >27.9)

## 2023-11-05 MED ORDER — SODIUM CHLORIDE 0.9% FLUSH
10.0000 mL | Freq: Once | INTRAVENOUS | Status: AC | PRN
  Administered 2023-11-05: 10 mL
  Filled 2023-11-05: qty 10

## 2023-11-05 MED ORDER — IRON SUCROSE 20 MG/ML IV SOLN
200.0000 mg | Freq: Once | INTRAVENOUS | Status: AC
  Administered 2023-11-05: 200 mg via INTRAVENOUS

## 2023-11-10 ENCOUNTER — Telehealth: Payer: Self-pay | Admitting: *Deleted

## 2023-11-10 ENCOUNTER — Ambulatory Visit: Payer: PPO

## 2023-11-10 ENCOUNTER — Ambulatory Visit: Payer: PPO | Admitting: Oncology

## 2023-11-10 NOTE — Telephone Encounter (Signed)
Josh B, any advice/ thoughts? Pt scheduled for another iron infusion on 12/27.

## 2023-11-10 NOTE — Telephone Encounter (Signed)
Patient daughter called reporting thatpt had an iron infusion and is having nausea and is not feeling well . She asks if we can do something for her. Please advise

## 2023-11-10 NOTE — Telephone Encounter (Signed)
Spoke to patient with Jasmine Webster present. Pt states she took Dramamine and it has helped. Advised her to stay hydrated and if she starts having more nausea dn dramamine is not helping to let us know so we can send in the zofran. Pt verbalized understanding. She will keep appt on Friday for iron infusion.

## 2023-11-13 ENCOUNTER — Encounter: Payer: Self-pay | Admitting: Oncology

## 2023-11-14 ENCOUNTER — Inpatient Hospital Stay: Payer: PPO

## 2023-11-14 VITALS — BP 134/52 | HR 69 | Temp 97.8°F | Resp 17

## 2023-11-14 DIAGNOSIS — D500 Iron deficiency anemia secondary to blood loss (chronic): Secondary | ICD-10-CM

## 2023-11-14 DIAGNOSIS — N189 Chronic kidney disease, unspecified: Secondary | ICD-10-CM | POA: Diagnosis not present

## 2023-11-14 MED ORDER — IRON SUCROSE 20 MG/ML IV SOLN
200.0000 mg | Freq: Once | INTRAVENOUS | Status: AC
  Administered 2023-11-14: 200 mg via INTRAVENOUS
  Filled 2023-11-14: qty 10

## 2023-11-14 MED ORDER — SODIUM CHLORIDE 0.9% FLUSH
10.0000 mL | Freq: Once | INTRAVENOUS | Status: AC | PRN
  Administered 2023-11-14: 10 mL
  Filled 2023-11-14: qty 10

## 2023-11-14 NOTE — Patient Instructions (Signed)
 [facility] - A DEPT OF [facility]  Discharge Instructions: Thank you for choosing [facility] to provide your oncology and hematology care.  If you have a lab appointment with the Cancer Center, please go directly to the Cancer Center and check in at the registration area.  Wear comfortable clothing and clothing appropriate for easy access to any Portacath or PICC line.   We strive to give you quality time with your provider. You may need to reschedule your appointment if you arrive late (15 or more minutes).  Arriving late affects you and other patients whose appointments are after yours.  Also, if you miss three or more appointments without notifying the office, you may be dismissed from the clinic at the provider's discretion.      For prescription refill requests, have your pharmacy contact our office and allow 72 hours for refills to be completed.    Today you received the following chemotherapy and/or immunotherapy agents Venofer.      To help prevent nausea and vomiting after your treatment, we encourage you to take your nausea medication as directed.  BELOW ARE SYMPTOMS THAT SHOULD BE REPORTED IMMEDIATELY: *FEVER GREATER THAN 100.4 F (38 C) OR HIGHER *CHILLS OR SWEATING *NAUSEA AND VOMITING THAT IS NOT CONTROLLED WITH YOUR NAUSEA MEDICATION *UNUSUAL SHORTNESS OF BREATH *UNUSUAL BRUISING OR BLEEDING *URINARY PROBLEMS (pain or burning when urinating, or frequent urination) *BOWEL PROBLEMS (unusual diarrhea, constipation, pain near the anus) TENDERNESS IN MOUTH AND THROAT WITH OR WITHOUT PRESENCE OF ULCERS (sore throat, sores in mouth, or a toothache) UNUSUAL RASH, SWELLING OR PAIN  UNUSUAL VAGINAL DISCHARGE OR ITCHING   Items with * indicate a potential emergency and should be followed up as soon as possible or go to the Emergency Department if any problems should occur.  Please show the CHEMOTHERAPY ALERT CARD or IMMUNOTHERAPY  ALERT CARD at check-in to the Emergency Department and triage nurse.  Should you have questions after your visit or need to cancel or reschedule your appointment, please contact [facility] - A DEPT OF [facility]  [phone] and follow the prompts.  Office hours are 8:00 a.m. to 4:30 p.m. Monday - Friday. Please note that voicemails left after 4:00 p.m. may not be returned until the following business day.  We are closed weekends and major holidays. You have access to a nurse at all times for urgent questions. Please call the main number to the clinic [phone] and follow the prompts.  For any non-urgent questions, you may also contact your provider using MyChart. We now offer e-Visits for anyone 70 and older to request care online for non-urgent symptoms. For details visit [email or web address].   Also download the MyChart app! Go to the app store, search "MyChart", open the app, select [place], and log in with your MyChart username and password.

## 2023-11-20 ENCOUNTER — Ambulatory Visit: Payer: PPO | Admitting: Podiatry

## 2023-11-20 ENCOUNTER — Encounter: Payer: Self-pay | Admitting: Oncology

## 2023-11-20 ENCOUNTER — Encounter: Payer: Self-pay | Admitting: Podiatry

## 2023-11-20 DIAGNOSIS — M79674 Pain in right toe(s): Secondary | ICD-10-CM

## 2023-11-20 DIAGNOSIS — E119 Type 2 diabetes mellitus without complications: Secondary | ICD-10-CM

## 2023-11-20 DIAGNOSIS — M79675 Pain in left toe(s): Secondary | ICD-10-CM

## 2023-11-20 DIAGNOSIS — B351 Tinea unguium: Secondary | ICD-10-CM

## 2023-11-21 ENCOUNTER — Inpatient Hospital Stay: Payer: PPO | Attending: Oncology

## 2023-11-21 VITALS — BP 135/46 | HR 76 | Temp 97.2°F | Resp 16

## 2023-11-21 DIAGNOSIS — D509 Iron deficiency anemia, unspecified: Secondary | ICD-10-CM | POA: Diagnosis present

## 2023-11-21 DIAGNOSIS — D500 Iron deficiency anemia secondary to blood loss (chronic): Secondary | ICD-10-CM

## 2023-11-21 MED ORDER — IRON SUCROSE 20 MG/ML IV SOLN
200.0000 mg | Freq: Once | INTRAVENOUS | Status: AC
  Administered 2023-11-21: 200 mg via INTRAVENOUS
  Filled 2023-11-21: qty 10

## 2023-11-21 NOTE — Patient Instructions (Signed)

## 2023-11-24 NOTE — Progress Notes (Signed)
  Subjective:  Patient ID: Jasmine Webster, female    DOB: 1926-03-05,  MRN: [id number]  89 y.o. female presents with preventative diabetic foot care and painful thick toenails that are difficult to trim. Pain interferes with ambulation. Aggravating factors include wearing enclosed shoe gear. Pain is relieved with periodic professional debridement. No chief complaint on file.    PCP: Sharma Coyer, MD.  New problem(s): None.   Review of Systems: Negative except as noted in the HPI.   Allergies  Allergen Reactions   Etodolac Nausea Only, Other (See Comments) and Anxiety    Dizziness, too   Naproxen Other (See Comments) and Anxiety    Gave her an ulcer after taking it for years   Statins Other (See Comments)    No energy and myalgias   Latex Rash   Terbinafine Other (See Comments) and Rash    Maybe made me nervous   Terbinafine Hcl Rash    Objective:  There were no vitals filed for this visit. Constitutional Patient is a pleasant 89 y.o. female WD, WN in NAD. AAO x 3.  Vascular Capillary fill time to digits <3 seconds.  DP/PT pulse(s) are faintly palpable b/l lower extremities. Pedal hair absent b/l. Lower extremity skin temperature gradient warm to cool b/l. No pain with calf compression b/l. No cyanosis or clubbing noted. No ischemia nor gangrene noted b/l.   Neurologic Protective sensation intact 5/5 intact bilaterally with 10g monofilament b/l. Vibratory sensation intact b/l. No clonus b/l.   Dermatologic Pedal skin is thin, shiny and atrophic b/l.  No open wounds b/l lower extremities. No interdigital macerations b/l lower extremities. Toenails 1-5 b/l elongated, discolored, dystrophic, thickened, crumbly with subungual debris and tenderness to dorsal palpation. No hyperkeratotic nor porokeratotic lesions present on today's visit.  Orthopedic: Normal muscle strength 5/5 to all lower extremity muscle groups bilaterally. HAV with bunion deformity noted b/l LE.  Hammertoe(s) noted to the L 2nd toe and R 2nd toe. Utilizes cane for ambulation assistance.   Last HgA1c:     Latest Ref Rng & Units 04/03/2023   11:27 AM  Hemoglobin A1C  Hemoglobin-A1c 4.0 - 5.6 % 7.1      Assessment:   1. Pain due to onychomycosis of toenails of both feet   2. Diabetes mellitus without complication (HCC)    Plan:  -Consent given for treatment as described below: -Examined patient. -Continue diabetic foot care principles: inspect feet daily, monitor glucose as recommended by PCP and/or Endocrinologist, and follow prescribed diet per PCP, Endocrinologist and/or dietician. -Continue supportive shoe gear daily. -Mycotic toenails 1-5 bilaterally were debrided in length and girth with sterile nail nippers and dremel without incident. -Patient/POA to call should there be question/concern in the interim.  Return in about 4 months (around 03/19/2024).  Delon LITTIE Merlin, DPM      [place] LOCATION: [address] [place] [address]                   Office [phone]   [facility] LOCATION: [address] [place], [place] [address] Office [phone]

## 2023-12-08 ENCOUNTER — Inpatient Hospital Stay: Payer: PPO

## 2023-12-08 DIAGNOSIS — D500 Iron deficiency anemia secondary to blood loss (chronic): Secondary | ICD-10-CM

## 2023-12-08 DIAGNOSIS — D509 Iron deficiency anemia, unspecified: Secondary | ICD-10-CM | POA: Diagnosis not present

## 2023-12-08 DIAGNOSIS — D631 Anemia in chronic kidney disease: Secondary | ICD-10-CM

## 2023-12-08 LAB — IRON AND TIBC
Iron: 101 ug/dL (ref 28–170)
Saturation Ratios: 36 % — ABNORMAL HIGH (ref 10.4–31.8)
TIBC: 280 ug/dL (ref 250–450)
UIBC: 179 ug/dL

## 2023-12-08 LAB — FERRITIN: Ferritin: 228 ng/mL (ref 11–307)

## 2023-12-08 LAB — RETIC PANEL
Immature Retic Fract: 10 % (ref 2.3–15.9)
RBC.: 3.69 MIL/uL — ABNORMAL LOW (ref 3.87–5.11)
Retic Count, Absolute: 54.6 10*3/uL (ref 19.0–186.0)
Retic Ct Pct: 1.5 % (ref 0.4–3.1)
Reticulocyte Hemoglobin: 34.6 pg (ref >27.9)

## 2023-12-10 ENCOUNTER — Inpatient Hospital Stay: Payer: PPO

## 2023-12-10 ENCOUNTER — Telehealth: Payer: Self-pay | Admitting: *Deleted

## 2023-12-10 ENCOUNTER — Inpatient Hospital Stay: Payer: PPO | Admitting: Oncology

## 2023-12-10 ENCOUNTER — Telehealth: Payer: Self-pay | Admitting: Oncology

## 2023-12-10 NOTE — Telephone Encounter (Signed)
Patient called to schedule sooner appointment than 2/5 was worried about waiting to come that far out. Appointment for tomorrow was open so I rescheduled her there. She was very Adult nurse. Appointment scheduled and confirmed with patient

## 2023-12-10 NOTE — Telephone Encounter (Signed)
Pt. Wants appt to move 1/23  and move it to another day and call her back with the new appt is done

## 2023-12-11 ENCOUNTER — Encounter: Payer: Self-pay | Admitting: Oncology

## 2023-12-11 ENCOUNTER — Inpatient Hospital Stay: Payer: PPO | Admitting: Oncology

## 2023-12-11 ENCOUNTER — Inpatient Hospital Stay: Payer: PPO

## 2023-12-11 VITALS — BP 138/50 | HR 62 | Temp 97.4°F | Resp 18 | Wt 152.4 lb

## 2023-12-11 DIAGNOSIS — D631 Anemia in chronic kidney disease: Secondary | ICD-10-CM | POA: Diagnosis not present

## 2023-12-11 DIAGNOSIS — N183 Chronic kidney disease, stage 3 unspecified: Secondary | ICD-10-CM | POA: Diagnosis not present

## 2023-12-11 DIAGNOSIS — D509 Iron deficiency anemia, unspecified: Secondary | ICD-10-CM | POA: Diagnosis not present

## 2023-12-11 DIAGNOSIS — D500 Iron deficiency anemia secondary to blood loss (chronic): Secondary | ICD-10-CM | POA: Diagnosis not present

## 2023-12-11 NOTE — Assessment & Plan Note (Addendum)
Labs are reviewed and discussed with patient. Both hemoglobin and iron panel have improved. Lab Results  Component Value Date   HGB 9.7 (L) 11/05/2023   TIBC 280 12/08/2023   IRONPCTSAT 36 (H) 12/08/2023   FERRITIN 228 12/08/2023    Hemoglobin has improved and ferritin is at goal.  Hold off IV Venofer. Continue Niferrex every other day  It is possible that she has occult GI bleeding.  She is at risk of developing AVM due to aortic stenosis. Previously discussed with GI, anesthetic risk is high for EGD/colonoscopy locally.  If patient develops severe recurrent IDA, she may need to go to tertiary center for further evaluation.

## 2023-12-11 NOTE — Progress Notes (Signed)
Hematology/Oncology Progress note [phone] [phone]        CHIEF COMPLAINTS/REASON FOR VISIT:  Anemia  ASSESSMENT & PLAN:  Iron deficiency anemia Labs are reviewed and discussed with patient. Both hemoglobin and iron panel have improved. Lab Results  Component Value Date   HGB 9.7 (L) 11/05/2023   TIBC 280 12/08/2023   IRONPCTSAT 36 (H) 12/08/2023   FERRITIN 228 12/08/2023    Hemoglobin has improved and ferritin is at goal.  Hold off IV Venofer. Continue Niferrex every other day  It is possible that she has occult GI bleeding.  She is at risk of developing AVM due to aortic stenosis. Previously discussed with GI, anesthetic risk is high for EGD/colonoscopy locally.  If patient develops severe recurrent IDA, she may need to go to tertiary center for further evaluation.  Anemia in chronic kidney disease (CKD) anemia secondary to chronic kidney disease, Hemoglobin has improved.   Orders Placed This Encounter  Procedures   CBC with Differential (Cancer Center Only)    Standing Status:   Future    Expected Date:   02/08/2024    Expiration Date:   12/10/2024   Ferritin    Standing Status:   Future    Expected Date:   02/08/2024    Expiration Date:   12/10/2024   Retic Panel    Standing Status:   Future    Expected Date:   02/08/2024    Expiration Date:   12/10/2024   Iron and TIBC    Standing Status:   Future    Expected Date:   02/08/2024    Expiration Date:   12/10/2024   Follow-up 2 months All questions were answered. The patient knows to call the clinic with any problems, questions or concerns.  Rickard Patience, MD, PhD [facility] Hematology Oncology 12/11/2023     HISTORY OF PRESENTING ILLNESS:  Jasmine Webster is a  89 y.o.  female with PMH listed below who was referred to me for anemia Reviewed patient's recent labs that was done.   Reviewed patient's previous labs ordered by primary care physician's office, anemia is chronic onset ,  duration is since January 2023. Patient reports very fatigued, lightheaded, shortness of breath with minimal exertion. She denies recent chest pain on exertion, pre-syncopal episodes, or palpitations She had not noticed any recent bleeding such as epistaxis, hematuria or hematochezia.  +Dark stool She denies over the counter NSAID ingestion. She is on antiplatelets agent-aspirin 81 mg. She denies any pica and eats a variety of diet.  She was hospitalized from 05/21/2022 - 05/24/2022, due to NSTEMI and acute CHF exacerbation, symptomatic anemia, status post 2 unit of PRBC transfusion.  Patient reports feeling better after blood transfusion. Cardiology recommends no invasive intervention during this admission.  There is plan for possible TAVR   INTERVAL HISTORY Jasmine Webster is a 89 y.o. female who has above history reviewed by me today presents for acute visit for iron deficiency anemia Patient is accompanied by her family member.  Fatigue has improved.  Denies any blood in the stool.  She takes Niferex every other day.  MEDICAL HISTORY:  Past Medical History:  Diagnosis Date   Arthritis    Cataract    cataract removal bilaterally approx 20 years ago   Chronic back pain    COVID-19 virus infection 12/2020   Diabetes mellitus without complication (HCC)    GI bleed    Hyperlipidemia    Hypertension    Iron deficiency  anemia    Non-obstructive CAD (coronary artery disease)    a. 04/2022 Cath: LM 10ost, LAD 30p, RI 60, LCX min irregs, LPAV min irregs, RCA min irregs.   Severe aortic stenosis    a. 05/2022 Echo: EF 55-60%, no rwma, mild LVH, GrII DD, nl RV fxn, mild MR, mild-mod MS, Sev AS (mean grad 33, peak grad 62, AVA 0.6cm^2).    SURGICAL HISTORY: Past Surgical History:  Procedure Laterality Date   ABDOMINAL HYSTERECTOMY     APPENDECTOMY     BACK SURGERY     three   CARDIAC CATHETERIZATION     6+yrs no stents Dr. Vanita Panda   CATARACT EXTRACTION     RIGHT/LEFT HEART  CATH AND CORONARY ANGIOGRAPHY N/A 04/26/2022   Procedure: RIGHT/LEFT HEART CATH AND CORONARY ANGIOGRAPHY;  Surgeon: Iran Ouch, MD;  Location: [facility] INVASIVE CV LAB;  Service: Cardiovascular;  Laterality: N/A;   THORACOLUMBAR SYRINGO SHUNT     TONSILLECTOMY AND ADENOIDECTOMY      SOCIAL HISTORY: Social History   Socioeconomic History   Marital status: Widowed    Spouse name: Not on file   Number of children: 2   Years of education: Not on file   Highest education level: 8th grade  Occupational History   Occupation: retired  Tobacco Use   Smoking status: Never   Smokeless tobacco: Never  Vaping Use   Vaping status: Never Used  Substance and Sexual Activity   Alcohol use: No    Alcohol/week: 0.0 standard drinks of alcohol   Drug use: No   Sexual activity: Not Currently  Other Topics Concern   Not on file  Social History Narrative   Lives with daughter, [name]   Social Drivers of Health   Financial Resource Strain: Low Risk  (09/23/2023)   Overall Financial Resource Strain (CARDIA)    Difficulty of Paying Living Expenses: Not hard at all  Food Insecurity: No Food Insecurity (09/23/2023)   Hunger Vital Sign    Worried About Running Out of Food in the Last Year: Never true    Ran Out of Food in the Last Year: Never true  Transportation Needs: No Transportation Needs (09/23/2023)   PRAPARE - Administrator, Civil Service (Medical): No    Lack of Transportation (Non-Medical): No  Physical Activity: Inactive (09/23/2023)   Exercise Vital Sign    Days of Exercise per Week: 0 days    Minutes of Exercise per Session: 0 min  Stress: No Stress Concern Present (09/23/2023)   [employer] of Occupational Health - Occupational Stress Questionnaire    Feeling of Stress : Not at all  Social Connections: Moderately Isolated (09/23/2023)   Social Connection and Isolation Panel [NHANES]    Frequency of Communication with Friends and Family: More than three times a  week    Frequency of Social Gatherings with Friends and Family: More than three times a week    Attends Religious Services: More than 4 times per year    Active Member of [employer] or Organizations: No    Attends Banker Meetings: Never    Marital Status: Widowed  Intimate Partner Violence: Not At Risk (09/23/2023)   Humiliation, Afraid, Rape, and Kick questionnaire    Fear of Current or Ex-Partner: No    Emotionally Abused: No    Physically Abused: No    Sexually Abused: No    FAMILY HISTORY: Family History  Problem Relation Age of Onset   Heart disease Mother  died from MI   Heart attack Mother    Heart disease Sister    Sudden death Brother        shot and beaten to death during home invasion   Stroke Sister    Sudden death Sister        MVA   Cancer Brother        died from lung cancer   Sudden death Sister        25 days old   Cancer Brother        died from lung cancer   Stroke Brother        cause of death   Diabetes Father    Stroke Father    Pneumonia Father     ALLERGIES:  is allergic to etodolac, naproxen, statins, latex, terbinafine, and terbinafine hcl.  MEDICATIONS:  Current Outpatient Medications  Medication Sig Dispense Refill   Artificial Tear Ointment (DRY EYES OP) Apply 1 drop to eye daily.     ascorbic acid (VITAMIN C) 250 MG tablet Take 1 tablet (250 mg total) by mouth daily. 30 tablet 1   cetirizine (ZYRTEC) 10 MG tablet Take 1 tablet (10 mg total) by mouth daily. 30 tablet 11   co-enzyme Q-10 30 MG capsule Take 30 mg by mouth daily.     cyanocobalamin (VITAMIN B12) 500 MCG tablet Take 1 tablet (500 mcg total) by mouth once a week. 30 tablet 1   ezetimibe (ZETIA) 10 MG tablet TAKE 1 TABLET BY MOUTH EVERY DAY 90 tablet 3   fluticasone (FLONASE) 50 MCG/ACT nasal spray Place 2 sprays into both nostrils daily. 16 g 6   furosemide (LASIX) 20 MG tablet TAKE 1 TABLET BY MOUTH EVERY OTHER DAY 45 tablet 1   insulin glargine-yfgn (SEMGLEE,  YFGN,) 100 UNIT/ML Pen INJECT 15 UNITS SUBCUTANEOUSLY     Insulin Pen Needle (B-D ULTRAFINE III SHORT PEN) 31G X 8 MM MISC CHECK SUGAR 2 TIMES DAILY, DX E11.9 100 each 12   iron polysaccharides (NIFEREX) 150 MG capsule TAKE 1 CAPSULE (150 MG TOTAL) BY MOUTH EVERY OTHER DAY 45 capsule 1   lisinopril (ZESTRIL) 10 MG tablet Take 1 tablet (10 mg total) by mouth daily. 30 tablet 5   Magnesium 400 MG CAPS Take 400 mg by mouth daily.     Multiple Vitamins-Minerals (PRESERVISION AREDS) CAPS Take 1 capsule by mouth 2 (two) times daily.      Omega-3 Fatty Acids (FISH OIL PO) Take by mouth daily.     omeprazole (PRILOSEC) 40 MG capsule TAKE 1 CAPSULE (40 MG TOTAL) BY MOUTH DAILY. 90 capsule 3   ONETOUCH ULTRA test strip CHECK SUGAR TWICE A DAY 200 strip 3   OneTouch UltraSoft 2 Lancets MISC USE AS INSTRUCTED 100 each 12   pantoprazole (PROTONIX) 40 MG tablet Take 1 tablet (40 mg total) by mouth 2 (two) times daily before a meal. 60 tablet 3   potassium chloride (KLOR-CON) 10 MEQ tablet TAKE 1 TABLET (10 MEQ TOTAL) BY MOUTH DAILY AS NEEDED. TAKE WITH FUROSEMIDE 90 tablet 1   silver sulfADIAZINE (SILVADENE) 1 % cream APPLY 1 APPLICATION TOPICALLY DAILY 50 g 0   nitroGLYCERIN (NITROSTAT) 0.4 MG SL tablet Place 1 tablet (0.4 mg total) under the tongue every 5 (five) minutes as needed for chest pain. (Patient not taking: Reported on 10/29/2023) 25 tablet 3   Turmeric 500 MG CAPS Take by mouth daily.     No current facility-administered medications for this visit.   Facility-Administered  Medications Ordered in Other Visits  Medication Dose Route Frequency Provider Last Rate Last Admin   0.9 %  sodium chloride infusion   Intravenous Continuous Rickard Patience, MD 20 mL/hr at 12/11/22 1458 New Bag at 12/11/22 1458    Review of Systems  Constitutional:  Positive for fatigue. Negative for chills and fever.  HENT:   Negative for hearing loss and voice change.   Eyes:  Negative for eye problems.  Respiratory:  Negative  for chest tightness, cough and shortness of breath.   Cardiovascular:  Negative for chest pain.  Gastrointestinal:  Negative for abdominal distention and abdominal pain.       Dark stool  Endocrine: Negative for hot flashes.  Genitourinary:  Negative for difficulty urinating and frequency.   Musculoskeletal:  Negative for arthralgias.  Skin:  Negative for itching and rash.  Neurological:  Negative for extremity weakness and light-headedness.  Hematological:  Negative for adenopathy.  Psychiatric/Behavioral:  Negative for confusion.     PHYSICAL EXAMINATION: ECOG PERFORMANCE STATUS: 2 - Symptomatic, <50% confined to bed Vitals:   12/11/23 1446  BP: (!) 138/50  Pulse: 62  Resp: 18  Temp: (!) 97.4 F (36.3 C)   Physical Exam Constitutional:      General: She is not in acute distress.    Comments: Patient sits in a wheelchair  HENT:     Head: Normocephalic and atraumatic.  Eyes:     General: No scleral icterus. Cardiovascular:     Rate and Rhythm: Normal rate and regular rhythm.     Heart sounds: Murmur heard.  Pulmonary:     Effort: Pulmonary effort is normal. No respiratory distress.     Breath sounds: No wheezing.  Abdominal:     General: Bowel sounds are normal. There is no distension.     Palpations: Abdomen is soft.  Musculoskeletal:        General: No deformity. Normal range of motion.     Cervical back: Normal range of motion and neck supple.     Comments: Trace edema bilaterally  Skin:    General: Skin is warm and dry.     Coloration: Skin is pale.     Findings: No erythema or rash.  Neurological:     Mental Status: She is alert and oriented to person, place, and time. Mental status is at baseline.     Cranial Nerves: No cranial nerve deficit.  Psychiatric:        Mood and Affect: Mood normal.      LABORATORY DATA:  I have reviewed the data as listed    Latest Ref Rng & Units 11/05/2023    1:34 PM 10/29/2023   10:49 AM 09/17/2023   11:30 AM  CBC   WBC 4.0 - 10.5 K/uL 7.7  7.2  6.1   Hemoglobin 12.0 - 15.0 g/dL 9.7  8.9  29.5   Hematocrit 36.0 - 46.0 % 31.6  29.0  34.3   Platelets 150 - 400 K/uL 282  254  CANCELED       Latest Ref Rng & Units 10/29/2023   10:49 AM 03/07/2023    2:51 PM 11/27/2022    3:17 AM  CMP  Glucose 70 - 99 mg/dL  621  308   BUN 8 - 23 mg/dL  18  18   Creatinine 6.57 - 1.00 mg/dL  8.46  9.62   Sodium 952 - 145 mmol/L  134  137   Potassium 3.5 - 5.1 mmol/L  4.2  3.8   Chloride 98 - 111 mmol/L  100  107   CO2 22 - 32 mmol/L  26  25   Calcium 8.9 - 10.3 mg/dL  8.7  8.2   Total Protein 6.5 - 8.1 g/dL 5.8     Total Bilirubin <1.2 mg/dL 0.8     Alkaline Phos 38 - 126 U/L 99     AST 15 - 41 U/L 23     ALT 0 - 44 U/L 13         Component Value Date/Time   IRON 101 12/08/2023 1100   IRON 36 12/04/2021 1302   TIBC 280 12/08/2023 1100   TIBC 327 12/04/2021 1302   FERRITIN 228 12/08/2023 1100   FERRITIN 115 10/02/2022 1305   IRONPCTSAT 36 (H) 12/08/2023 1100   IRONPCTSAT 11 (L) 12/04/2021 1302     RADIOGRAPHIC STUDIES: I have personally reviewed the radiological images as listed and agreed with the findings in the report. No results found.

## 2023-12-11 NOTE — Assessment & Plan Note (Signed)
anemia secondary to chronic kidney disease, Hemoglobin has improved.

## 2023-12-12 ENCOUNTER — Other Ambulatory Visit: Payer: Self-pay | Admitting: Oncology

## 2023-12-12 ENCOUNTER — Other Ambulatory Visit: Payer: Self-pay | Admitting: Family Medicine

## 2023-12-12 NOTE — Telephone Encounter (Signed)
Requested Prescriptions  Pending Prescriptions Disp Refills   lisinopril (ZESTRIL) 10 MG tablet [Pharmacy Med Name: LISINOPRIL 10 MG TABLET] 90 tablet 0    Sig: TAKE 1 TABLET BY MOUTH EVERY DAY     Cardiovascular:  ACE Inhibitors Failed - 12/12/2023 11:42 AM      Failed - Cr in normal range and within 180 days    Creatinine, Ser  Date Value Ref Range Status  03/07/2023 0.98 0.44 - 1.00 mg/dL Final   Creatinine, Urine  Date Value Ref Range Status  06/20/2022 73 mg/dL Final         Failed - K in normal range and within 180 days    Potassium  Date Value Ref Range Status  03/07/2023 4.2 3.5 - 5.1 mmol/L Final         Passed - Patient is not pregnant      Passed - Last BP in normal range    BP Readings from Last 1 Encounters:  12/11/23 (!) 138/50         Passed - Valid encounter within last 6 months    Recent Outpatient Visits           2 months ago Weakness   [place] [facility] [place], [place], MD   5 months ago COVID-19   [facility] [place], [place], PA-C   8 months ago Primary hypertension   [place] [facility] [place], [place], MD   1 year ago Melena   [place] [facility] Simmons-Robinson, [place], MD   1 year ago Benign essential HTN   [place] [facility] Simmons-Robinson, [place], MD       Future Appointments             In 1 week Simmons-Robinson, Tawanna Cooler, MD [facility]

## 2023-12-22 ENCOUNTER — Other Ambulatory Visit: Payer: Self-pay | Admitting: Family Medicine

## 2023-12-22 ENCOUNTER — Encounter: Payer: Self-pay | Admitting: Oncology

## 2023-12-24 ENCOUNTER — Ambulatory Visit: Payer: PPO

## 2023-12-24 ENCOUNTER — Ambulatory Visit: Payer: PPO | Admitting: Oncology

## 2023-12-24 ENCOUNTER — Ambulatory Visit: Payer: Self-pay | Admitting: Family Medicine

## 2024-01-01 ENCOUNTER — Other Ambulatory Visit (HOSPITAL_COMMUNITY): Payer: Self-pay

## 2024-01-01 ENCOUNTER — Encounter: Payer: Self-pay | Admitting: Oncology

## 2024-01-02 ENCOUNTER — Encounter: Payer: Self-pay | Admitting: Oncology

## 2024-01-02 ENCOUNTER — Other Ambulatory Visit: Payer: Self-pay

## 2024-01-02 ENCOUNTER — Other Ambulatory Visit (HOSPITAL_COMMUNITY): Payer: Self-pay

## 2024-01-02 MED FILL — "Insulin Pen Needle 31 G X 8 MM (1/3"" or 5/16"")": 50 days supply | Qty: 100 | Fill #0 | Status: CN

## 2024-01-02 MED FILL — Omeprazole Cap Delayed Release 40 MG: ORAL | 90 days supply | Qty: 90 | Fill #0 | Status: CN

## 2024-01-02 MED FILL — Potassium Chloride Tab ER 10 mEq: ORAL | 90 days supply | Qty: 90 | Fill #0 | Status: CN

## 2024-01-02 MED FILL — Lancets: 100 days supply | Qty: 100 | Fill #0 | Status: CN

## 2024-01-02 MED FILL — Lisinopril Tab 10 MG: ORAL | 90 days supply | Qty: 90 | Fill #0 | Status: CN

## 2024-01-02 MED FILL — Polysaccharide Iron Complex Cap 150 MG (Iron Equivalent): ORAL | 90 days supply | Qty: 45 | Fill #0 | Status: CN

## 2024-01-02 MED FILL — Ezetimibe Tab 10 MG: ORAL | 90 days supply | Qty: 90 | Fill #0 | Status: CN

## 2024-01-02 MED FILL — Silver Sulfadiazine Cream 1%: CUTANEOUS | 50 days supply | Qty: 50 | Fill #0 | Status: CN

## 2024-01-06 ENCOUNTER — Other Ambulatory Visit (HOSPITAL_COMMUNITY): Payer: Self-pay

## 2024-01-12 ENCOUNTER — Encounter: Payer: Self-pay | Admitting: Family Medicine

## 2024-01-12 ENCOUNTER — Other Ambulatory Visit (HOSPITAL_COMMUNITY): Payer: Self-pay

## 2024-01-12 ENCOUNTER — Other Ambulatory Visit: Payer: Self-pay

## 2024-01-12 ENCOUNTER — Ambulatory Visit (INDEPENDENT_AMBULATORY_CARE_PROVIDER_SITE_OTHER): Payer: PPO | Admitting: Family Medicine

## 2024-01-12 VITALS — BP 146/60 | HR 62 | Ht 64.0 in | Wt 151.0 lb

## 2024-01-12 DIAGNOSIS — I272 Pulmonary hypertension, unspecified: Secondary | ICD-10-CM | POA: Diagnosis not present

## 2024-01-12 DIAGNOSIS — I5089 Other heart failure: Secondary | ICD-10-CM

## 2024-01-12 DIAGNOSIS — E7800 Pure hypercholesterolemia, unspecified: Secondary | ICD-10-CM

## 2024-01-12 DIAGNOSIS — D631 Anemia in chronic kidney disease: Secondary | ICD-10-CM

## 2024-01-12 DIAGNOSIS — Z794 Long term (current) use of insulin: Secondary | ICD-10-CM

## 2024-01-12 DIAGNOSIS — I10 Essential (primary) hypertension: Secondary | ICD-10-CM | POA: Diagnosis not present

## 2024-01-12 DIAGNOSIS — N183 Anemia in chronic kidney disease: Secondary | ICD-10-CM

## 2024-01-12 DIAGNOSIS — I25118 Atherosclerotic heart disease of native coronary artery with other forms of angina pectoris: Secondary | ICD-10-CM

## 2024-01-12 DIAGNOSIS — E119 Type 2 diabetes mellitus without complications: Secondary | ICD-10-CM

## 2024-01-12 DIAGNOSIS — E039 Hypothyroidism, unspecified: Secondary | ICD-10-CM

## 2024-01-12 DIAGNOSIS — I350 Nonrheumatic aortic (valve) stenosis: Secondary | ICD-10-CM

## 2024-01-12 DIAGNOSIS — N1832 Chronic kidney disease, stage 3b: Secondary | ICD-10-CM

## 2024-01-12 MED ORDER — LISINOPRIL 10 MG PO TABS
10.0000 mg | ORAL_TABLET | Freq: Every day | ORAL | 0 refills | Status: DC
Start: 2024-01-12 — End: 2024-05-05
  Filled 2024-01-12 – 2024-01-21 (×2): qty 90, 90d supply, fill #0

## 2024-01-12 MED ORDER — ONETOUCH ULTRASOFT 2 LANCETS MISC
12 refills | Status: DC
  Filled 2024-01-12 – 2024-01-21 (×2): qty 100, 30d supply, fill #0
  Filled 2024-05-05: qty 100, 100d supply, fill #0
  Filled 2024-05-28: qty 100, 100d supply, fill #1

## 2024-01-12 MED ORDER — FUROSEMIDE 20 MG PO TABS
20.0000 mg | ORAL_TABLET | ORAL | 1 refills | Status: DC
Start: 2024-01-12 — End: 2024-02-05
  Filled 2024-01-12 – 2024-01-21 (×2): qty 45, 90d supply, fill #0

## 2024-01-12 MED FILL — "Insulin Pen Needle 31 G X 8 MM (1/3"" or 5/16"")": 50 days supply | Qty: 100 | Fill #0 | Status: AC

## 2024-01-12 MED FILL — "Insulin Pen Needle 31 G X 8 MM (1/3"" or 5/16"")": 50 days supply | Qty: 100 | Fill #0 | Status: CN

## 2024-01-12 NOTE — Assessment & Plan Note (Signed)
 Chronic condition. Last saw cardiology in November 2024. Discussed importance of monitoring symptoms and regular cardiology follow-ups. - Continue Lasix 20 mg every other day

## 2024-01-12 NOTE — Assessment & Plan Note (Signed)
 Severe aortic stenosis with dyspnea on exertion. Last echocardiogram in 2023. Discussed importance of monitoring symptoms and regular cardiology follow-ups. - Continue current management with cardiology

## 2024-01-12 NOTE — Assessment & Plan Note (Signed)
 Chronic condition. Managed by pulmonology. Discussed importance of regular pulmonology follow-ups to monitor disease progression. - Continue current management with pulmonology

## 2024-01-12 NOTE — Progress Notes (Signed)
 Established patient visit   Patient: Jasmine Webster   DOB: March 09, 1926   89 y.o. Female  MRN: [id number] Visit Date: 01/12/2024  Today's healthcare provider: Ronnald Ramp, MD   Chief Complaint  Patient presents with   Fatigue    Since her last visit she has been feeling a lot better, no new concerns    Health Maintenance    Foot exam VACCINES not in clinic during the time of her visit    Subjective     HPI     Fatigue    Additional comments: Since her last visit she has been feeling a lot better, no new concerns         Health Maintenance    Additional comments: Foot exam VACCINES not in clinic during the time of her visit       Last edited by Thedora Hinders, CMA on 01/12/2024  1:40 PM.       Discussed the use of AI scribe software for clinical note transcription with the patient, who gave verbal consent to proceed.  History of Present Illness   Jasmine Webster is a 89 year old female with multiple chronic conditions who presents for a follow-up visit.  She is here for follow-up of her chronic conditions, including type 2 diabetes, stage 3B chronic kidney disease, pulmonary hypertension, essential hypertension, coronary artery disease, adult hypothyroidism, hyperlipidemia, heart failure, aortic valve stenosis, and iron deficiency anemia.  Her type 2 diabetes is being monitored, with the last hemoglobin A1c collected in May 2024. She is due for a repeat A1c today. She uses OneTouch UltraSoft 2 lancets for monitoring.  Her stage 3B chronic kidney disease and pulmonary hypertension are stable. She continues to take Lasix 20 mg every other day, Zetia 10 mg daily, and lisinopril 10 mg daily for her essential hypertension.  Her coronary artery disease is managed with nitroglycerin 0.4 mg sublingual tablets as needed and Zetia 10 mg daily. She experiences stable angina.  Her adult hypothyroidism is stable, with TSH and T4 levels within normal range as  of 2023. No recent measurements have been taken.  She has hyperlipidemia, for which she continues Zetia 10 mg daily. She follows up with cardiology for her heart conditions, including nonobstructive coronary artery disease and severe aortic stenosis. Her last echocardiogram was in 2023.  She experiences dyspnea on exertion and has a history of heart failure. She last saw cardiology in November 2024.  She is followed by oncology for iron deficiency anemia, with her last visit on December 11, 2023. Her hemoglobin improved to 9.7 in December 2024, and her ferritin was at goal. She continues Niferex every other day. There is concern about an occult GI bleed and risk of AVM due to her aortic stenosis.  She feels generally well but mentions occasional dizziness, which occurred once when she felt unable to get up and needed assistance. She also experiences regular vertigo, which she attributes to her eyes and puzzle activities.  She mentions a recent foot examination where she was told she has poor circulation in one foot, but no swelling was noted.         Past Medical History:  Diagnosis Date   Arthritis    Cataract    cataract removal bilaterally approx 20 years ago   Chronic back pain    COVID-19 virus infection 12/2020   Diabetes mellitus without complication (HCC)    GI bleed    Hyperlipidemia    Hypertension  Iron deficiency anemia    Non-obstructive CAD (coronary artery disease)    a. 04/2022 Cath: LM 10ost, LAD 30p, RI 60, LCX min irregs, LPAV min irregs, RCA min irregs.   Severe aortic stenosis    a. 05/2022 Echo: EF 55-60%, no rwma, mild LVH, GrII DD, nl RV fxn, mild MR, mild-mod MS, Sev AS (mean grad 33, peak grad 62, AVA 0.6cm^2).    Medications: Outpatient Medications Prior to Visit  Medication Sig   Artificial Tear Ointment (DRY EYES OP) Apply 1 drop to eye daily.   ascorbic acid (VITAMIN C) 250 MG tablet Take 1 tablet (250 mg total) by mouth daily.   cetirizine (ZYRTEC)  10 MG tablet Take 1 tablet (10 mg total) by mouth daily.   co-enzyme Q-10 30 MG capsule Take 30 mg by mouth daily.   cyanocobalamin (VITAMIN B12) 500 MCG tablet Take 1 tablet (500 mcg total) by mouth once a week.   ezetimibe (ZETIA) 10 MG tablet TAKE 1 TABLET BY MOUTH EVERY DAY   fluticasone (FLONASE) 50 MCG/ACT nasal spray Place 2 sprays into both nostrils daily.   insulin glargine-yfgn (SEMGLEE, YFGN,) 100 UNIT/ML Pen INJECT 15 UNITS SUBCUTANEOUSLY   Insulin Pen Needle (B-D ULTRAFINE III SHORT PEN) 31G X 8 MM MISC CHECK SUGAR 2 TIMES DAILY, DX E11.9   iron polysaccharides (NIFEREX) 150 MG capsule Take 1 capsule (150 mg total) by mouth every other day.   Magnesium 400 MG CAPS Take 400 mg by mouth daily.   Multiple Vitamins-Minerals (PRESERVISION AREDS) CAPS Take 1 capsule by mouth 2 (two) times daily.    nitroGLYCERIN (NITROSTAT) 0.4 MG SL tablet Place 1 tablet (0.4 mg total) under the tongue every 5 (five) minutes as needed for chest pain. (Patient not taking: Reported on 10/29/2023)   Omega-3 Fatty Acids (FISH OIL PO) Take by mouth daily.   omeprazole (PRILOSEC) 40 MG capsule Take 1 capsule (40 mg total) by mouth daily.   ONETOUCH ULTRA test strip CHECK SUGAR TWICE A DAY   pantoprazole (PROTONIX) 40 MG tablet Take 1 tablet (40 mg total) by mouth 2 (two) times daily before a meal.   potassium chloride (KLOR-CON) 10 MEQ tablet Take 1 tablet (10 mEq total) by mouth daily as needed. Take with furosemide.   silver sulfADIAZINE (SILVADENE) 1 % cream APPLY 1 APPLICATION TOPICALLY DAILY   Turmeric 500 MG CAPS Take by mouth daily.   [DISCONTINUED] furosemide (LASIX) 20 MG tablet TAKE 1 TABLET BY MOUTH EVERY OTHER DAY   [DISCONTINUED] lisinopril (ZESTRIL) 10 MG tablet TAKE 1 TABLET BY MOUTH EVERY DAY   [DISCONTINUED] OneTouch UltraSoft 2 Lancets MISC USE AS INSTRUCTED   Facility-Administered Medications Prior to Visit  Medication Dose Route Frequency Provider   0.9 %  sodium chloride infusion    Intravenous Continuous Rickard Patience, MD    Review of Systems  Last CBC Lab Results  Component Value Date   WBC 7.7 11/05/2023   HGB 9.7 (L) 11/05/2023   HCT 31.6 (L) 11/05/2023   MCV 100.0 11/05/2023   MCH 30.7 11/05/2023   RDW 14.1 11/05/2023   PLT 282 11/05/2023   Last metabolic panel Lab Results  Component Value Date   GLUCOSE 116 (H) 03/07/2023   NA 134 (L) 03/07/2023   K 4.2 03/07/2023   CL 100 03/07/2023   CO2 26 03/07/2023   BUN 18 03/07/2023   CREATININE 0.98 03/07/2023   GFRNONAA 53 (L) 03/07/2023   CALCIUM 8.7 (L) 03/07/2023   PHOS 4.1 06/20/2022  PROT 5.8 (L) 10/29/2023   ALBUMIN 3.2 (L) 10/29/2023   LABGLOB 2.0 10/02/2022   AGRATIO 2.1 10/02/2022   BILITOT 0.8 10/29/2023   ALKPHOS 99 10/29/2023   AST 23 10/29/2023   ALT 13 10/29/2023   ANIONGAP 8 03/07/2023   Last lipids Lab Results  Component Value Date   CHOL 230 (H) 10/02/2022   HDL 46 10/02/2022   LDLCALC 141 (H) 10/02/2022   TRIG 237 (H) 10/02/2022   CHOLHDL 5.0 (H) 10/02/2022   Last hemoglobin A1c Lab Results  Component Value Date   HGBA1C 7.1 (A) 04/03/2023   Last thyroid functions Lab Results  Component Value Date   TSH 2.510 10/02/2022        Objective    BP (!) 146/60 (Cuff Size: Normal)   Pulse 62   Ht 5\' 4"  (1.626 m)   Wt 151 lb (68.5 kg)   SpO2 100%   BMI 25.92 kg/m  BP Readings from Last 3 Encounters:  01/12/24 (!) 146/60  12/11/23 (!) 138/50  11/21/23 (!) 135/46   Wt Readings from Last 3 Encounters:  01/12/24 151 lb (68.5 kg)  12/11/23 152 lb 6.4 oz (69.1 kg)  10/02/23 152 lb 9.6 oz (69.2 kg)        Physical Exam Vitals reviewed.  Constitutional:      General: She is not in acute distress.    Appearance: Normal appearance. She is not ill-appearing, toxic-appearing or diaphoretic.  Eyes:     Conjunctiva/sclera: Conjunctivae normal.  Cardiovascular:     Rate and Rhythm: Normal rate and regular rhythm.     Pulses: Normal pulses.     Heart sounds: Normal  heart sounds. No murmur heard.    No friction rub. No gallop.  Pulmonary:     Effort: Pulmonary effort is normal. No respiratory distress.     Breath sounds: Normal breath sounds. No stridor. No wheezing, rhonchi or rales.  Abdominal:     General: Bowel sounds are normal. There is no distension.     Palpations: Abdomen is soft.     Tenderness: There is no abdominal tenderness.  Musculoskeletal:     Right lower leg: No edema.     Left lower leg: No edema.  Skin:    Findings: No erythema or rash.  Neurological:     Mental Status: She is alert and oriented to person, place, and time.  Psychiatric:        Mood and Affect: Mood and affect normal.        Speech: Speech normal.        Behavior: Behavior normal. Behavior is cooperative.       No results found for any visits on 01/12/24.  Assessment & Plan     Problem List Items Addressed This Visit       Cardiovascular and Mediastinum   Severe aortic stenosis   Severe aortic stenosis with dyspnea on exertion. Last echocardiogram in 2023. Discussed importance of monitoring symptoms and regular cardiology follow-ups. - Continue current management with cardiology      Relevant Medications   furosemide (LASIX) 20 MG tablet   lisinopril (ZESTRIL) 10 MG tablet   Pulmonary hypertension (HCC) - Primary   Chronic condition. Managed by pulmonology. Discussed importance of regular pulmonology follow-ups to monitor disease progression. - Continue current management with pulmonology      Relevant Medications   furosemide (LASIX) 20 MG tablet   lisinopril (ZESTRIL) 10 MG tablet   Heart failure, unspecified (HCC)   Chronic  condition. Last saw cardiology in November 2024. Discussed importance of monitoring symptoms and regular cardiology follow-ups. - Continue Lasix 20 mg every other day      Relevant Medications   furosemide (LASIX) 20 MG tablet   lisinopril (ZESTRIL) 10 MG tablet   Coronary artery disease   Chronic condition. Last  echocardiogram in 2023. Managed with nitroglycerin as needed. Discussed recognizing angina symptoms and appropriate nitroglycerin use. - Continue nitroglycerin 0.4 mg sublingual tablets as needed - Continue Zetia 10 mg daily      Relevant Medications   furosemide (LASIX) 20 MG tablet   lisinopril (ZESTRIL) 10 MG tablet   Benign essential HTN   Chronic condition. Blood pressure in office was 153/39. Discussed unusual readings with high systolic and low diastolic values. Patient reported occasional dizziness. Discussed monitoring blood pressure and potential medication adjustments if symptoms persist. - Continue Lasix 20 mg every other day - Continue lisinopril 10 mg daily      Relevant Medications   furosemide (LASIX) 20 MG tablet   lisinopril (ZESTRIL) 10 MG tablet     Endocrine   Type 2 diabetes mellitus without complications (HCC)   Chronic condition. Last HbA1c in May 2024. Blood pressure in office was 153/39. Discussed importance of monitoring blood glucose and foot care. - Check HbA1c today -  diabetes foot exam UTD, followed by podiatry - Refill OneTouch UltraSoft 2 lancets      Relevant Medications   OneTouch UltraSoft 2 Lancets MISC   lisinopril (ZESTRIL) 10 MG tablet   Other Relevant Orders   Hemoglobin A1c   HM DIABETES FOOT EXAM (Completed)   Adult hypothyroidism   Chronic condition. TSH and T4 levels within normal range in 2023. Discussed importance of regular thyroid function monitoring. - Continue current management        Genitourinary   Stage 3b chronic kidney disease (CKD) (HCC)   Chronic condition. Managed with Lasix 20 mg every other day. Discussed importance of monitoring kidney function and avoiding nephrotoxic medications. - Continue Lasix 20 mg every other day      Anemia in chronic kidney disease (CKD) (Chronic)   Chronic condition. Last hemoglobin was 9.7 in December 2024. Ferritin at goal. Managed by oncology with concerns about occult GI bleed  and AVM risk due to aortic stenosis. Discussed monitoring hemoglobin levels and GI bleeding symptoms. - Continue Niferex every other day - Follow up with oncology as scheduled        Other   Hypercholesteremia   Chronic condition. Managed with Zetia. Discussed importance of maintaining lipid levels within target range to reduce cardiovascular risk. - Continue Zetia 10 mg daily      Relevant Medications   furosemide (LASIX) 20 MG tablet   lisinopril (ZESTRIL) 10 MG tablet          Peripheral Vascular Disease Possible peripheral vascular disease suggested by podiatrist due to poor foot circulation. No current swelling or pain. Discussed monitoring for symptoms such as pain, swelling, and redness in legs. - Monitor for symptoms such as pain, swelling, and redness in legs    General Health Maintenance Routine health maintenance discussed. Discussed importance of regular screenings and vaccinations. - Schedule follow-up visit in June 2025 - Draw blood for updated HbA1c     Return in about 4 months (around 05/11/2024) for CHRONIC F/U.         Ronnald Ramp, MD  [facility] [phone] (phone) [phone] (fax)  [address]

## 2024-01-12 NOTE — Assessment & Plan Note (Signed)
 Chronic condition. TSH and T4 levels within normal range in 2023. Discussed importance of regular thyroid function monitoring. - Continue current management

## 2024-01-12 NOTE — Assessment & Plan Note (Signed)
 Chronic condition. Managed with Lasix 20 mg every other day. Discussed importance of monitoring kidney function and avoiding nephrotoxic medications. - Continue Lasix 20 mg every other day

## 2024-01-12 NOTE — Assessment & Plan Note (Signed)
 Chronic condition. Blood pressure in office was 153/39. Discussed unusual readings with high systolic and low diastolic values. Patient reported occasional dizziness. Discussed monitoring blood pressure and potential medication adjustments if symptoms persist. - Continue Lasix 20 mg every other day - Continue lisinopril 10 mg daily

## 2024-01-12 NOTE — Assessment & Plan Note (Signed)
 Chronic condition. Managed with Zetia. Discussed importance of maintaining lipid levels within target range to reduce cardiovascular risk. - Continue Zetia 10 mg daily

## 2024-01-12 NOTE — Assessment & Plan Note (Signed)
 Chronic condition. Last HbA1c in May 2024. Blood pressure in office was 153/39. Discussed importance of monitoring blood glucose and foot care. - Check HbA1c today -  diabetes foot exam UTD, followed by podiatry - Refill OneTouch UltraSoft 2 lancets

## 2024-01-12 NOTE — Assessment & Plan Note (Signed)
 Chronic condition. Last echocardiogram in 2023. Managed with nitroglycerin as needed. Discussed recognizing angina symptoms and appropriate nitroglycerin use. - Continue nitroglycerin 0.4 mg sublingual tablets as needed - Continue Zetia 10 mg daily

## 2024-01-12 NOTE — Assessment & Plan Note (Signed)
 Chronic condition. Last hemoglobin was 9.7 in December 2024. Ferritin at goal. Managed by oncology with concerns about occult GI bleed and AVM risk due to aortic stenosis. Discussed monitoring hemoglobin levels and GI bleeding symptoms. - Continue Niferex every other day - Follow up with oncology as scheduled

## 2024-01-13 ENCOUNTER — Encounter: Payer: Self-pay | Admitting: Family Medicine

## 2024-01-13 LAB — HEMOGLOBIN A1C
Est. average glucose Bld gHb Est-mCnc: 148 mg/dL
Hgb A1c MFr Bld: 6.8 % — ABNORMAL HIGH (ref 4.8–5.6)

## 2024-01-15 ENCOUNTER — Other Ambulatory Visit: Payer: Self-pay

## 2024-01-16 ENCOUNTER — Other Ambulatory Visit: Payer: Self-pay | Admitting: Family Medicine

## 2024-01-16 DIAGNOSIS — Z794 Long term (current) use of insulin: Secondary | ICD-10-CM

## 2024-01-16 NOTE — Telephone Encounter (Signed)
 Requested medication (s) are due for refill today: Yes  Requested medication (s) are on the active medication list: Yes  Last refill:  09/17/23  Future visit scheduled: Yes  Notes to clinic:  Unable to refill due to no refill protocol for this medication.      Requested Prescriptions  Pending Prescriptions Disp Refills   SEMGLEE, YFGN, 100 UNIT/ML Pen [Pharmacy Med Name: SEMGLEE (YFGN) 100 UNIT/ML PEN]  1    Sig: INJECT 18 UNITS SUBCUTANEOUSLY DAILY AS DIRECTED     Off-Protocol Failed - 01/16/2024  3:38 PM      Failed - Medication not assigned to a protocol, review manually.      Passed - Valid encounter within last 12 months    Recent Outpatient Visits           3 months ago Weakness   [facility] [place], [place], [place]   6 months ago COVID-19   [facility] [place], [place], [place]   9 months ago Primary hypertension   [facility] Simmons-Robinson, [place], [place]   1 year ago Melena   [place] [facility] Simmons-Robinson, Tawanna Cooler, MD   1 year ago Benign essential HTN   [facility] Simmons-Robinson, Tawanna Cooler, MD       Future Appointments             In 3 months Simmons-Robinson, Tawanna Cooler, MD [facility], [place]

## 2024-01-20 ENCOUNTER — Telehealth: Payer: Self-pay | Admitting: Family Medicine

## 2024-01-20 ENCOUNTER — Other Ambulatory Visit: Payer: Self-pay

## 2024-01-20 NOTE — Telephone Encounter (Signed)
 Copied from CRM [id number]. Topic: Clinical - Medication Question >> Jan 20, 2024  1:15 PM Jasmine Webster wrote: Reason for CRM: Patient called in wanting to check the status of her medications that were suppose to be sent to her home. She stated she hasn't received any yet. She wants [facility] switched [facility] to [facility] pharmacy so that it can be mailed to her home. The patient doesn't know which medications are suppose to be sent but she stated it is 3 of them.

## 2024-01-20 NOTE — Telephone Encounter (Signed)
 Called and spoke to Clydie Braun the pt daughter to inform her she needed to contact [facility] at [phone] to create an account. Before the medication can be mailed. She stated she understood and would call back if she had any question

## 2024-01-21 ENCOUNTER — Other Ambulatory Visit: Payer: Self-pay

## 2024-01-21 ENCOUNTER — Other Ambulatory Visit (HOSPITAL_COMMUNITY): Payer: Self-pay

## 2024-01-21 MED FILL — Omeprazole Cap Delayed Release 40 MG: ORAL | 90 days supply | Qty: 90 | Fill #0 | Status: AC

## 2024-01-22 ENCOUNTER — Other Ambulatory Visit (HOSPITAL_COMMUNITY): Payer: Self-pay

## 2024-02-05 ENCOUNTER — Other Ambulatory Visit: Payer: Self-pay | Admitting: Cardiovascular Disease

## 2024-02-05 DIAGNOSIS — I5089 Other heart failure: Secondary | ICD-10-CM

## 2024-02-09 ENCOUNTER — Inpatient Hospital Stay: Payer: PPO | Attending: Oncology

## 2024-02-09 DIAGNOSIS — N189 Chronic kidney disease, unspecified: Secondary | ICD-10-CM | POA: Insufficient documentation

## 2024-02-09 DIAGNOSIS — Z801 Family history of malignant neoplasm of trachea, bronchus and lung: Secondary | ICD-10-CM | POA: Diagnosis not present

## 2024-02-09 DIAGNOSIS — I350 Nonrheumatic aortic (valve) stenosis: Secondary | ICD-10-CM | POA: Insufficient documentation

## 2024-02-09 DIAGNOSIS — D509 Iron deficiency anemia, unspecified: Secondary | ICD-10-CM | POA: Insufficient documentation

## 2024-02-09 DIAGNOSIS — D631 Anemia in chronic kidney disease: Secondary | ICD-10-CM | POA: Diagnosis not present

## 2024-02-09 DIAGNOSIS — D500 Iron deficiency anemia secondary to blood loss (chronic): Secondary | ICD-10-CM

## 2024-02-09 LAB — CBC WITH DIFFERENTIAL (CANCER CENTER ONLY)
Abs Immature Granulocytes: 0.06 10*3/uL (ref 0.00–0.07)
Basophils Absolute: 0.1 10*3/uL (ref 0.0–0.1)
Basophils Relative: 1 %
Eosinophils Absolute: 0.1 10*3/uL (ref 0.0–0.5)
Eosinophils Relative: 2 %
HCT: 37.6 % (ref 36.0–46.0)
Hemoglobin: 11.9 g/dL — ABNORMAL LOW (ref 12.0–15.0)
Immature Granulocytes: 1 %
Lymphocytes Relative: 28 %
Lymphs Abs: 1.8 10*3/uL (ref 0.7–4.0)
MCH: 30.8 pg (ref 26.0–34.0)
MCHC: 31.6 g/dL (ref 30.0–36.0)
MCV: 97.4 fL (ref 80.0–100.0)
Monocytes Absolute: 0.6 10*3/uL (ref 0.1–1.0)
Monocytes Relative: 10 %
Neutro Abs: 3.7 10*3/uL (ref 1.7–7.7)
Neutrophils Relative %: 58 %
Platelet Count: 197 10*3/uL (ref 150–400)
RBC: 3.86 MIL/uL — ABNORMAL LOW (ref 3.87–5.11)
RDW: 13.7 % (ref 11.5–15.5)
WBC Count: 6.4 10*3/uL (ref 4.0–10.5)
nRBC: 0 % (ref 0.0–0.2)

## 2024-02-09 LAB — RETIC PANEL
Immature Retic Fract: 15 % (ref 2.3–15.9)
RBC.: 3.87 MIL/uL (ref 3.87–5.11)
Retic Count, Absolute: 75.9 10*3/uL (ref 19.0–186.0)
Retic Ct Pct: 2 % (ref 0.4–3.1)
Reticulocyte Hemoglobin: 33.9 pg (ref >27.9)

## 2024-02-09 LAB — FERRITIN: Ferritin: 165 ng/mL (ref 11–307)

## 2024-02-09 LAB — IRON AND TIBC
Iron: 97 ug/dL (ref 28–170)
Saturation Ratios: 33 % — ABNORMAL HIGH (ref 10.4–31.8)
TIBC: 293 ug/dL (ref 250–450)
UIBC: 196 ug/dL

## 2024-02-12 ENCOUNTER — Encounter: Payer: Self-pay | Admitting: Oncology

## 2024-02-12 ENCOUNTER — Inpatient Hospital Stay: Payer: PPO

## 2024-02-12 ENCOUNTER — Inpatient Hospital Stay (HOSPITAL_BASED_OUTPATIENT_CLINIC_OR_DEPARTMENT_OTHER): Payer: PPO | Admitting: Oncology

## 2024-02-12 VITALS — BP 155/77 | HR 65 | Temp 97.5°F | Resp 18 | Wt 152.4 lb

## 2024-02-12 DIAGNOSIS — D631 Anemia in chronic kidney disease: Secondary | ICD-10-CM | POA: Diagnosis not present

## 2024-02-12 DIAGNOSIS — D500 Iron deficiency anemia secondary to blood loss (chronic): Secondary | ICD-10-CM | POA: Diagnosis not present

## 2024-02-12 DIAGNOSIS — N183 Chronic kidney disease, stage 3 unspecified: Secondary | ICD-10-CM | POA: Diagnosis not present

## 2024-02-12 DIAGNOSIS — D509 Iron deficiency anemia, unspecified: Secondary | ICD-10-CM | POA: Diagnosis not present

## 2024-02-12 NOTE — Assessment & Plan Note (Signed)
 anemia secondary to chronic kidney disease, Hemoglobin has improved.

## 2024-02-12 NOTE — Assessment & Plan Note (Signed)
 Labs are reviewed and discussed with patient. Both hemoglobin and iron panel have improved. Lab Results  Component Value Date   HGB 11.9 (L) 02/09/2024   TIBC 293 02/09/2024   IRONPCTSAT 33 (H) 02/09/2024   FERRITIN 165 02/09/2024    Hemoglobin has improved and ferritin is at goal.  Hold off IV Venofer. Continue Niferrex every other day  It is possible that she has occult GI bleeding.  She is at risk of developing AVM due to aortic stenosis. Previously discussed with GI, anesthetic risk is high for EGD/colonoscopy locally.  If patient develops severe recurrent IDA, she may need to go to tertiary center for further evaluation.

## 2024-02-12 NOTE — Progress Notes (Signed)
 Hematology/Oncology Progress note [phone] [phone]        CHIEF COMPLAINTS/REASON FOR VISIT:  Anemia  ASSESSMENT & PLAN:  Iron deficiency anemia Labs are reviewed and discussed with patient. Both hemoglobin and iron panel have improved. Lab Results  Component Value Date   HGB 11.9 (L) 02/09/2024   TIBC 293 02/09/2024   IRONPCTSAT 33 (H) 02/09/2024   FERRITIN 165 02/09/2024    Hemoglobin has improved and ferritin is at goal.  Hold off IV Venofer. Continue Niferrex every other day  It is possible that she has occult GI bleeding.  She is at risk of developing AVM due to aortic stenosis. Previously discussed with GI, anesthetic risk is high for EGD/colonoscopy locally.  If patient develops severe recurrent IDA, she may need to go to tertiary center for further evaluation.  Anemia in chronic kidney disease (CKD) anemia secondary to chronic kidney disease, Hemoglobin has improved.   Orders Placed This Encounter  Procedures   CBC with Differential (Cancer Center Only)    Standing Status:   Future    Expected Date:   06/13/2024    Expiration Date:   02/11/2025   Iron and TIBC    Standing Status:   Future    Expected Date:   06/13/2024    Expiration Date:   02/11/2025   Ferritin    Standing Status:   Future    Expected Date:   06/13/2024    Expiration Date:   02/11/2025   Retic Panel    Standing Status:   Future    Expected Date:   06/13/2024    Expiration Date:   02/11/2025   Follow-up 4 months All questions were answered. The patient knows to call the clinic with any problems, questions or concerns.  Rickard Patience, MD, PhD [facility] Hematology Oncology 02/12/2024     HISTORY OF PRESENTING ILLNESS:  Jasmine Webster is a  89 y.o.  female with PMH listed below who was referred to me for anemia Reviewed patient's recent labs that was done.   Reviewed patient's previous labs ordered by primary care physician's office, anemia is chronic onset ,  duration is since January 2023. Patient reports very fatigued, lightheaded, shortness of breath with minimal exertion. She denies recent chest pain on exertion, pre-syncopal episodes, or palpitations She had not noticed any recent bleeding such as epistaxis, hematuria or hematochezia.  +Dark stool She denies over the counter NSAID ingestion. She is on antiplatelets agent-aspirin 81 mg. She denies any pica and eats a variety of diet.  She was hospitalized from 05/21/2022 - 05/24/2022, due to NSTEMI and acute CHF exacerbation, symptomatic anemia, status post 2 unit of PRBC transfusion.  Patient reports feeling better after blood transfusion. Cardiology recommends no invasive intervention during this admission.  There is plan for possible TAVR   INTERVAL HISTORY Jasmine Webster is a 89 y.o. female who has above history reviewed by me today presents for acute visit for iron deficiency anemia Patient is accompanied by her family member.  Fatigue has improved.  Denies any blood in the stool.  She takes Niferex every other day.  MEDICAL HISTORY:  Past Medical History:  Diagnosis Date   Arthritis    Cataract    cataract removal bilaterally approx 20 years ago   Chronic back pain    COVID-19 virus infection 12/2020   Diabetes mellitus without complication (HCC)    GI bleed    Hyperlipidemia    Hypertension    Iron deficiency  anemia    Non-obstructive CAD (coronary artery disease)    a. 04/2022 Cath: LM 10ost, LAD 30p, RI 60, LCX min irregs, LPAV min irregs, RCA min irregs.   Severe aortic stenosis    a. 05/2022 Echo: EF 55-60%, no rwma, mild LVH, GrII DD, nl RV fxn, mild MR, mild-mod MS, Sev AS (mean grad 33, peak grad 62, AVA 0.6cm^2).    SURGICAL HISTORY: Past Surgical History:  Procedure Laterality Date   ABDOMINAL HYSTERECTOMY     APPENDECTOMY     BACK SURGERY     three   CARDIAC CATHETERIZATION     6+yrs no stents Dr. Vanita Panda   CATARACT EXTRACTION     RIGHT/LEFT HEART  CATH AND CORONARY ANGIOGRAPHY N/A 04/26/2022   Procedure: RIGHT/LEFT HEART CATH AND CORONARY ANGIOGRAPHY;  Surgeon: Iran Ouch, MD;  Location: [facility] INVASIVE CV LAB;  Service: Cardiovascular;  Laterality: N/A;   THORACOLUMBAR SYRINGO SHUNT     TONSILLECTOMY AND ADENOIDECTOMY      SOCIAL HISTORY: Social History   Socioeconomic History   Marital status: Widowed    Spouse name: Not on file   Number of children: 2   Years of education: Not on file   Highest education level: 8th grade  Occupational History   Occupation: retired  Tobacco Use   Smoking status: Never   Smokeless tobacco: Never  Vaping Use   Vaping status: Never Used  Substance and Sexual Activity   Alcohol use: No    Alcohol/week: 0.0 standard drinks of alcohol   Drug use: No   Sexual activity: Not Currently  Other Topics Concern   Not on file  Social History Narrative   Lives with daughter, [name]   Social Drivers of Health   Financial Resource Strain: Low Risk  (01/12/2024)   Overall Financial Resource Strain (CARDIA)    Difficulty of Paying Living Expenses: Not hard at all  Food Insecurity: No Food Insecurity (01/12/2024)   Hunger Vital Sign    Worried About Running Out of Food in the Last Year: Never true    Ran Out of Food in the Last Year: Never true  Transportation Needs: No Transportation Needs (01/12/2024)   PRAPARE - Administrator, Civil Service (Medical): No    Lack of Transportation (Non-Medical): No  Physical Activity: Inactive (01/12/2024)   Exercise Vital Sign    Days of Exercise per Week: 0 days    Minutes of Exercise per Session: 0 min  Stress: No Stress Concern Present (01/12/2024)   [employer] of Occupational Health - Occupational Stress Questionnaire    Feeling of Stress : Not at all  Social Connections: Moderately Isolated (01/12/2024)   Social Connection and Isolation Panel [NHANES]    Frequency of Communication with Friends and Family: More than three times a  week    Frequency of Social Gatherings with Friends and Family: More than three times a week    Attends Religious Services: More than 4 times per year    Active Member of [employer] or Organizations: No    Attends Banker Meetings: Never    Marital Status: Never married  Intimate Partner Violence: Not At Risk (01/12/2024)   Humiliation, Afraid, Rape, and Kick questionnaire    Fear of Current or Ex-Partner: No    Emotionally Abused: No    Physically Abused: No    Sexually Abused: No    FAMILY HISTORY: Family History  Problem Relation Age of Onset   Heart disease  Mother        died from MI   Heart attack Mother    Heart disease Sister    Sudden death Brother        shot and beaten to death during home invasion   Stroke Sister    Sudden death Sister        MVA   Cancer Brother        died from lung cancer   Sudden death Sister        32 days old   Cancer Brother        died from lung cancer   Stroke Brother        cause of death   Diabetes Father    Stroke Father    Pneumonia Father     ALLERGIES:  is allergic to etodolac, naproxen, statins, latex, terbinafine, and terbinafine hcl.  MEDICATIONS:  Current Outpatient Medications  Medication Sig Dispense Refill   Artificial Tear Ointment (DRY EYES OP) Apply 1 drop to eye daily.     ascorbic acid (VITAMIN C) 250 MG tablet Take 1 tablet (250 mg total) by mouth daily. 30 tablet 1   cetirizine (ZYRTEC) 10 MG tablet Take 1 tablet (10 mg total) by mouth daily. 30 tablet 11   co-enzyme Q-10 30 MG capsule Take 30 mg by mouth daily.     cyanocobalamin (VITAMIN B12) 500 MCG tablet Take 1 tablet (500 mcg total) by mouth once a week. 30 tablet 1   ezetimibe (ZETIA) 10 MG tablet TAKE 1 TABLET BY MOUTH EVERY DAY 90 tablet 3   fluticasone (FLONASE) 50 MCG/ACT nasal spray Place 2 sprays into both nostrils daily. 16 g 6   furosemide (LASIX) 20 MG tablet TAKE 1 TABLET BY MOUTH EVERY OTHER DAY 45 tablet 3   insulin glargine-yfgn  (SEMGLEE, YFGN,) 100 UNIT/ML Pen INJECT 18 UNITS SUBCUTANEOUSLY DAILY AS DIRECTED 9 mL 2   Insulin Pen Needle (B-D ULTRAFINE III SHORT PEN) 31G X 8 MM MISC CHECK SUGAR 2 TIMES DAILY, DX E11.9 100 each 12   iron polysaccharides (NIFEREX) 150 MG capsule Take 1 capsule (150 mg total) by mouth every other day. 45 capsule 1   lisinopril (ZESTRIL) 10 MG tablet Take 1 tablet (10 mg total) by mouth daily. 90 tablet 0   Magnesium 400 MG CAPS Take 400 mg by mouth daily.     Multiple Vitamins-Minerals (PRESERVISION AREDS) CAPS Take 1 capsule by mouth 2 (two) times daily.      Omega-3 Fatty Acids (FISH OIL PO) Take by mouth daily.     omeprazole (PRILOSEC) 40 MG capsule Take 1 capsule (40 mg total) by mouth daily. 90 capsule 3   ONETOUCH ULTRA test strip CHECK SUGAR TWICE A DAY 200 strip 3   OneTouch UltraSoft 2 Lancets MISC USE AS INSTRUCTED 100 each 12   pantoprazole (PROTONIX) 40 MG tablet Take 1 tablet (40 mg total) by mouth 2 (two) times daily before a meal. 60 tablet 3   potassium chloride (KLOR-CON) 10 MEQ tablet Take 1 tablet (10 mEq total) by mouth daily as needed. Take with furosemide. 90 tablet 1   silver sulfADIAZINE (SILVADENE) 1 % cream APPLY 1 APPLICATION TOPICALLY DAILY 50 g 0   Turmeric 500 MG CAPS Take by mouth daily.     nitroGLYCERIN (NITROSTAT) 0.4 MG SL tablet Place 1 tablet (0.4 mg total) under the tongue every 5 (five) minutes as needed for chest pain. (Patient not taking: Reported on 10/29/2023) 25 tablet 3  No current facility-administered medications for this visit.   Facility-Administered Medications Ordered in Other Visits  Medication Dose Route Frequency Provider Last Rate Last Admin   0.9 %  sodium chloride infusion   Intravenous Continuous Rickard Patience, MD 20 mL/hr at 12/11/22 1458 New Bag at 12/11/22 1458    Review of Systems  Constitutional:  Positive for fatigue. Negative for chills and fever.  HENT:   Negative for hearing loss and voice change.   Eyes:  Negative for eye  problems.  Respiratory:  Negative for chest tightness, cough and shortness of breath.   Cardiovascular:  Negative for chest pain.  Gastrointestinal:  Negative for abdominal distention and abdominal pain.       Dark stool  Endocrine: Negative for hot flashes.  Genitourinary:  Negative for difficulty urinating and frequency.   Musculoskeletal:  Negative for arthralgias.  Skin:  Negative for itching and rash.  Neurological:  Negative for extremity weakness and light-headedness.  Hematological:  Negative for adenopathy.  Psychiatric/Behavioral:  Negative for confusion.     PHYSICAL EXAMINATION: ECOG PERFORMANCE STATUS: 2 - Symptomatic, <50% confined to bed Vitals:   02/12/24 1339  BP: (!) 155/77  Pulse: 65  Resp: 18  Temp: (!) 97.5 F (36.4 C)   Physical Exam Constitutional:      General: She is not in acute distress.    Comments: Patient sits in a wheelchair  HENT:     Head: Normocephalic and atraumatic.  Eyes:     General: No scleral icterus. Cardiovascular:     Rate and Rhythm: Normal rate and regular rhythm.     Heart sounds: Murmur heard.  Pulmonary:     Effort: Pulmonary effort is normal. No respiratory distress.     Breath sounds: No wheezing.  Abdominal:     General: Bowel sounds are normal. There is no distension.     Palpations: Abdomen is soft.  Musculoskeletal:        General: No deformity. Normal range of motion.     Cervical back: Normal range of motion and neck supple.     Comments: Trace edema bilaterally  Skin:    General: Skin is warm and dry.     Coloration: Skin is pale.     Findings: No erythema or rash.  Neurological:     Mental Status: She is alert and oriented to person, place, and time. Mental status is at baseline.     Cranial Nerves: No cranial nerve deficit.  Psychiatric:        Mood and Affect: Mood normal.      LABORATORY DATA:  I have reviewed the data as listed    Latest Ref Rng & Units 02/09/2024   10:57 AM 11/05/2023    1:34  PM 10/29/2023   10:49 AM  CBC  WBC 4.0 - 10.5 K/uL 6.4  7.7  7.2   Hemoglobin 12.0 - 15.0 g/dL 57.8  9.7  8.9   Hematocrit 36.0 - 46.0 % 37.6  31.6  29.0   Platelets 150 - 400 K/uL 197  282  254       Latest Ref Rng & Units 10/29/2023   10:49 AM 03/07/2023    2:51 PM 11/27/2022    3:17 AM  CMP  Glucose 70 - 99 mg/dL  469  629   BUN 8 - 23 mg/dL  18  18   Creatinine 5.28 - 1.00 mg/dL  4.13  2.44   Sodium 010 - 145 mmol/L  134  137  Potassium 3.5 - 5.1 mmol/L  4.2  3.8   Chloride 98 - 111 mmol/L  100  107   CO2 22 - 32 mmol/L  26  25   Calcium 8.9 - 10.3 mg/dL  8.7  8.2   Total Protein 6.5 - 8.1 g/dL 5.8     Total Bilirubin <1.2 mg/dL 0.8     Alkaline Phos 38 - 126 U/L 99     AST 15 - 41 U/L 23     ALT 0 - 44 U/L 13         Component Value Date/Time   IRON 97 02/09/2024 1057   IRON 36 12/04/2021 1302   TIBC 293 02/09/2024 1057   TIBC 327 12/04/2021 1302   FERRITIN 165 02/09/2024 1057   FERRITIN 115 10/02/2022 1305   IRONPCTSAT 33 (H) 02/09/2024 1057   IRONPCTSAT 11 (L) 12/04/2021 1302     RADIOGRAPHIC STUDIES: I have personally reviewed the radiological images as listed and agreed with the findings in the report. No results found.

## 2024-03-20 ENCOUNTER — Other Ambulatory Visit (HOSPITAL_COMMUNITY): Payer: Self-pay

## 2024-03-20 ENCOUNTER — Other Ambulatory Visit: Payer: Self-pay | Admitting: Family Medicine

## 2024-03-20 DIAGNOSIS — E118 Type 2 diabetes mellitus with unspecified complications: Secondary | ICD-10-CM

## 2024-03-20 MED FILL — Ezetimibe Tab 10 MG: ORAL | 90 days supply | Qty: 90 | Fill #0 | Status: AC

## 2024-03-22 ENCOUNTER — Ambulatory Visit (INDEPENDENT_AMBULATORY_CARE_PROVIDER_SITE_OTHER): Payer: PPO | Admitting: Podiatry

## 2024-03-22 ENCOUNTER — Other Ambulatory Visit (HOSPITAL_COMMUNITY): Payer: Self-pay

## 2024-03-22 DIAGNOSIS — Z91198 Patient's noncompliance with other medical treatment and regimen for other reason: Secondary | ICD-10-CM

## 2024-03-22 MED ORDER — GLUCOSE BLOOD VI STRP
ORAL_STRIP | 3 refills | Status: DC
  Filled 2024-03-22: qty 200, 100d supply, fill #0
  Filled 2024-06-28: qty 200, 100d supply, fill #1

## 2024-03-22 NOTE — Progress Notes (Signed)
 1. Failure to attend appointment with reason given    Patient sick. Called to cancel. Will reschedule when she is feeling better.

## 2024-05-05 ENCOUNTER — Other Ambulatory Visit (HOSPITAL_COMMUNITY): Payer: Self-pay

## 2024-05-05 ENCOUNTER — Other Ambulatory Visit: Payer: Self-pay

## 2024-05-05 ENCOUNTER — Encounter: Payer: Self-pay | Admitting: Oncology

## 2024-05-05 ENCOUNTER — Other Ambulatory Visit: Payer: Self-pay | Admitting: Family Medicine

## 2024-05-05 DIAGNOSIS — Z794 Long term (current) use of insulin: Secondary | ICD-10-CM

## 2024-05-05 MED FILL — Polysaccharide Iron Complex Cap 150 MG (Iron Equivalent): ORAL | 90 days supply | Qty: 45 | Fill #0 | Status: AC

## 2024-05-07 ENCOUNTER — Other Ambulatory Visit: Payer: Self-pay

## 2024-05-07 ENCOUNTER — Other Ambulatory Visit (HOSPITAL_COMMUNITY): Payer: Self-pay

## 2024-05-07 MED ORDER — LISINOPRIL 10 MG PO TABS
10.0000 mg | ORAL_TABLET | Freq: Every day | ORAL | 0 refills | Status: DC
  Filled 2024-05-07: qty 90, 90d supply, fill #0

## 2024-05-07 NOTE — Telephone Encounter (Signed)
 Requested Prescriptions  Pending Prescriptions Disp Refills   lisinopril  (ZESTRIL ) 10 MG tablet 90 tablet 0    Sig: Take 1 tablet (10 mg total) by mouth daily.     Cardiovascular:  ACE Inhibitors Failed - 05/07/2024 12:25 PM      Failed - Cr in normal range and within 180 days    Creatinine, Ser  Date Value Ref Range Status  03/07/2023 0.98 0.44 - 1.00 mg/dL Final   Creatinine, Urine  Date Value Ref Range Status  06/20/2022 73 mg/dL Final         Failed - K in normal range and within 180 days    Potassium  Date Value Ref Range Status  03/07/2023 4.2 3.5 - 5.1 mmol/L Final         Failed - Last BP in normal range    BP Readings from Last 1 Encounters:  02/12/24 (!) 155/77         Passed - Patient is not pregnant      Passed - Valid encounter within last 6 months    Recent Outpatient Visits           3 months ago Pulmonary hypertension (HCC)   [facility] Simmons-Robinson, Judyann Number, MD       Future Appointments             In 4 days Simmons-Robinson, Judyann Number, MD [facility]

## 2024-05-08 ENCOUNTER — Other Ambulatory Visit (HOSPITAL_COMMUNITY): Payer: Self-pay

## 2024-05-11 ENCOUNTER — Ambulatory Visit (INDEPENDENT_AMBULATORY_CARE_PROVIDER_SITE_OTHER): Payer: PPO | Admitting: Family Medicine

## 2024-05-11 ENCOUNTER — Encounter: Payer: Self-pay | Admitting: Family Medicine

## 2024-05-11 VITALS — BP 131/82 | HR 76 | Ht 64.0 in | Wt 149.0 lb

## 2024-05-11 DIAGNOSIS — N1832 Chronic kidney disease, stage 3b: Secondary | ICD-10-CM

## 2024-05-11 DIAGNOSIS — E039 Hypothyroidism, unspecified: Secondary | ICD-10-CM | POA: Diagnosis not present

## 2024-05-11 DIAGNOSIS — Z794 Long term (current) use of insulin: Secondary | ICD-10-CM

## 2024-05-11 DIAGNOSIS — I10 Essential (primary) hypertension: Secondary | ICD-10-CM

## 2024-05-11 DIAGNOSIS — K219 Gastro-esophageal reflux disease without esophagitis: Secondary | ICD-10-CM | POA: Diagnosis not present

## 2024-05-11 DIAGNOSIS — E782 Mixed hyperlipidemia: Secondary | ICD-10-CM

## 2024-05-11 DIAGNOSIS — E119 Type 2 diabetes mellitus without complications: Secondary | ICD-10-CM | POA: Diagnosis not present

## 2024-05-11 DIAGNOSIS — R011 Cardiac murmur, unspecified: Secondary | ICD-10-CM

## 2024-05-11 NOTE — Progress Notes (Signed)
 Established patient visit   Patient: Jasmine Webster   DOB: May 20, 1926   89 y.o. Female  MRN: [id number] Visit Date: 05/11/2024  Today's healthcare provider: Rockie Agent, MD   Chief Complaint  Patient presents with   Hypertension   Subjective       Discussed the use of AI scribe software for clinical note transcription with the patient, who gave verbal consent to proceed.  History of Present Illness Jasmine Webster is a 89 year old female who presents for a follow-up visit regarding her hypertension.  Her blood pressure today is 131/82 mmHg. She is currently taking lisinopril  10 mg daily for her hypertension.  She has a history of hypercholesterolemia and is taking Zetia  10 mg daily. Her cholesterol levels have not been checked in two years, and she has two refills left for her medication.  For heart failure, she is on Lasix  20 mg every other day, taking it three times a week. She reports no lower extremity edema.  She manages her chronic diabetes with 18 units of Semglee  insulin  daily. Her last A1c was reviewed, and the next check is scheduled for the end of August.  She is managing iron  deficiency anemia with iron  polycycline 150 mg every other day. Her last hemoglobin was 11.9 g/dL.  She takes magnesium  400 mg daily and potassium 10 mEq daily as needed, particularly in relation to her Lasix  use.  She has a history of GERD and is currently taking omeprazole  40 mg daily. She mentioned a switch to Protonix , but due to a pharmacy issue, she reverted to omeprazole .  She has a history of shingles and received a shingles vaccine in the past, though she is unsure of the specific type. She also received a tetanus vaccine in 2011.  Her social history includes limited outings, primarily for medical appointments, and she no longer attends church.     Past Medical History:  Diagnosis Date   Arthritis    Cataract    cataract removal bilaterally approx 20 years  ago   Chronic back pain    COVID-19 virus infection 12/2020   Diabetes mellitus without complication (HCC)    GI bleed    Hyperlipidemia    Hypertension    Iron  deficiency anemia    Non-obstructive CAD (coronary artery disease)    a. 04/2022 Cath: LM 10ost, LAD 30p, RI 60, LCX min irregs, LPAV min irregs, RCA min irregs.   Severe aortic stenosis    a. 05/2022 Echo: EF 55-60%, no rwma, mild LVH, GrII DD, nl RV fxn, mild MR, mild-mod MS, Sev AS (mean grad 33, peak grad 62, AVA 0.6cm^2).    Medications: Outpatient Medications Prior to Visit  Medication Sig   Artificial Tear Ointment (DRY EYES OP) Apply 1 drop to eye daily.   ascorbic acid  (VITAMIN C ) 250 MG tablet Take 1 tablet (250 mg total) by mouth daily.   cetirizine  (ZYRTEC ) 10 MG tablet Take 1 tablet (10 mg total) by mouth daily.   co-enzyme Q-10 30 MG capsule Take 30 mg by mouth daily.   cyanocobalamin  (VITAMIN B12) 500 MCG tablet Take 1 tablet (500 mcg total) by mouth once a week.   ezetimibe  (ZETIA ) 10 MG tablet TAKE 1 TABLET BY MOUTH EVERY DAY   fluticasone  (FLONASE ) 50 MCG/ACT nasal spray Place 2 sprays into both nostrils daily. (Patient not taking: Reported on 05/11/2024)   furosemide  (LASIX ) 20 MG tablet TAKE 1 TABLET BY MOUTH EVERY OTHER DAY  glucose blood test strip CHECK SUGAR TWICE A DAY   insulin  glargine-yfgn (SEMGLEE , YFGN,) 100 UNIT/ML Pen INJECT 18 UNITS SUBCUTANEOUSLY DAILY AS DIRECTED   Insulin  Pen Needle (B-D ULTRAFINE III SHORT PEN) 31G X 8 MM MISC CHECK SUGAR 2 TIMES DAILY, DX E11.9   iron  polysaccharides (NIFEREX) 150 MG capsule Take 1 capsule (150 mg total) by mouth every other day.   lisinopril  (ZESTRIL ) 10 MG tablet Take 1 tablet (10 mg total) by mouth daily.   Magnesium  400 MG CAPS Take 400 mg by mouth daily.   Multiple Vitamins-Minerals (PRESERVISION AREDS) CAPS Take 1 capsule by mouth 2 (two) times daily.    nitroGLYCERIN  (NITROSTAT ) 0.4 MG SL tablet Place 1 tablet (0.4 mg total) under the tongue every 5  (five) minutes as needed for chest pain. (Patient not taking: Reported on 05/11/2024)   Omega-3 Fatty Acids (FISH OIL PO) Take by mouth daily.   omeprazole  (PRILOSEC) 40 MG capsule Take 1 capsule (40 mg total) by mouth daily.   ONETOUCH ULTRA test strip CHECK SUGAR TWICE A DAY   OneTouch UltraSoft 2 Lancets MISC USE AS INSTRUCTED   pantoprazole  (PROTONIX ) 40 MG tablet Take 1 tablet (40 mg total) by mouth 2 (two) times daily before a meal.   potassium chloride  (KLOR-CON ) 10 MEQ tablet Take 1 tablet (10 mEq total) by mouth daily as needed. Take with furosemide .   silver  sulfADIAZINE  (SILVADENE ) 1 % cream APPLY 1 APPLICATION TOPICALLY DAILY   Turmeric 500 MG CAPS Take by mouth daily.   Facility-Administered Medications Prior to Visit  Medication Dose Route Frequency Provider   0.9 %  sodium chloride  infusion   Intravenous Continuous Babara Call, MD    Review of Systems  Last CBC Lab Results  Component Value Date   WBC 6.4 02/09/2024   HGB 11.9 (L) 02/09/2024   HCT 37.6 02/09/2024   MCV 97.4 02/09/2024   MCH 30.8 02/09/2024   RDW 13.7 02/09/2024   PLT 197 02/09/2024   Last metabolic panel Lab Results  Component Value Date   GLUCOSE 116 (H) 03/07/2023   NA 134 (L) 03/07/2023   K 4.2 03/07/2023   CL 100 03/07/2023   CO2 26 03/07/2023   BUN 18 03/07/2023   CREATININE 0.98 03/07/2023   GFRNONAA 53 (L) 03/07/2023   CALCIUM  8.7 (L) 03/07/2023   PHOS 4.1 06/20/2022   PROT 5.8 (L) 10/29/2023   ALBUMIN 3.2 (L) 10/29/2023   LABGLOB 2.0 10/02/2022   AGRATIO 2.1 10/02/2022   BILITOT 0.8 10/29/2023   ALKPHOS 99 10/29/2023   AST 23 10/29/2023   ALT 13 10/29/2023   ANIONGAP 8 03/07/2023   Last lipids Lab Results  Component Value Date   CHOL 230 (H) 10/02/2022   HDL 46 10/02/2022   LDLCALC 141 (H) 10/02/2022   TRIG 237 (H) 10/02/2022   CHOLHDL 5.0 (H) 10/02/2022   The ASCVD Risk score (Arnett DK, et al., 2019) failed to calculate for the following reasons:   The 2019 ASCVD risk  score is only valid for ages 8 to 32   Risk score cannot be calculated because patient has a medical history suggesting prior/existing ASCVD  Lab Results  Component Value Date   HGBA1C 6.8 (H) 01/12/2024         Objective    BP 131/82   Pulse 76   Ht 5' 4 (1.626 m)   Wt 149 lb (67.6 kg)   BMI 25.58 kg/m  BP Readings from Last 3 Encounters:  05/11/24 131/82  02/12/24 (!) 155/77  01/12/24 (!) 146/60   Wt Readings from Last 3 Encounters:  05/11/24 149 lb (67.6 kg)  02/12/24 152 lb 6.4 oz (69.1 kg)  01/12/24 151 lb (68.5 kg)        Physical Exam  Physical Exam VITALS: BP- 131/82 GENERAL: No acute distress. CHEST: Lungs clear to auscultation, no crackles. CARDIOVASCULAR: Systolic murmur present. EXTREMITIES: No lower extremity edema.    No results found for any visits on 05/11/24.  Assessment & Plan     Problem List Items Addressed This Visit       Cardiovascular and Mediastinum   Hypertension - Primary     Digestive   GERD without esophagitis     Endocrine   Type 2 diabetes mellitus without complications (HCC)   Adult hypothyroidism     Genitourinary   Stage 3b chronic kidney disease (CKD) (HCC)     Other   Mixed hyperlipidemia   Cardiac murmur    Assessment & Plan Heart Failure Chronic heart failure, well-managed with diuretic therapy. No signs of fluid overload. She takes Lasix  three times a week. - Continue Lasix  20 mg every other day.  Hypertension Chronic hypertension, well-controlled with current medication regimen. Blood pressure is 131/82 mmHg. She recently refilled lisinopril , sufficient until September. - Continue lisinopril  10 mg daily.  Diabetes Mellitus Chronic diabetes, managed with insulin  and diet. A1c monitoring is current. Plan to check A1c today to avoid testing in August. - Continue Semglee  18 units daily.  Iron  Deficiency Anemia Chronic iron  deficiency anemia, well-managed with iron  supplementation. Last hemoglobin  was 11.9 g/dL. - Continue iron  polycycline 150 mg every other day. -continue f/u with hematology as scheduled   Hypercholesterolemia Chronic hypercholesterolemia, managed with Zetia . Cholesterol levels have not been checked in two years. She has two refills left for Zetia , likely sufficient until November. - Continue Zetia  10 mg daily. - Order cholesterol panel.  Gastroesophageal Reflux Disease (GERD) Chronic GERD, managed with omeprazole . Adequate symptom control with current regimen. Pharmacy did not fulfill switch to Protonix . - Continue omeprazole  40 mg daily.  Electrolyte Management Potassium and magnesium  are supplemented due to diuretic use. She takes potassium as needed based on Lasix  use. - Continue potassium 10 mEq daily as needed. - Continue magnesium  400 mg daily.  General Health Maintenance Discussion of vaccinations including Shingrix and tetanus. She is hesitant about further vaccinations due to age and potential side effects. Shingrix is 90% effective in preventing shingles but can cause flu-like symptoms. Tetanus is recommended every ten years, last received in 2011. - Discuss tetanus vaccination with pharmacist.  Follow-up Routine follow-up and monitoring of chronic conditions. Blood work to be done today includes comprehensive metabolic panel, thyroid  function tests (TSH, T4, T3), and A1c. - Order comprehensive metabolic panel, thyroid  function tests (TSH, T4, T3), and A1c today. - Schedule follow-up appointment in November.     No follow-ups on file.         Rockie Agent, MD  [facility] [phone] (phone) [phone] (fax)  [facility]

## 2024-05-12 LAB — CMP14+EGFR
ALT: 14 IU/L (ref 0–32)
AST: 21 IU/L (ref 0–40)
Albumin: 4.3 g/dL (ref 3.6–4.6)
Alkaline Phosphatase: 146 IU/L — ABNORMAL HIGH (ref 44–121)
BUN/Creatinine Ratio: 25 (ref 12–28)
BUN: 29 mg/dL (ref 10–36)
Bilirubin Total: 0.3 mg/dL (ref 0.0–1.2)
CO2: 19 mmol/L — ABNORMAL LOW (ref 20–29)
Calcium: 9.2 mg/dL (ref 8.7–10.3)
Chloride: 102 mmol/L (ref 96–106)
Creatinine, Ser: 1.15 mg/dL — ABNORMAL HIGH (ref 0.57–1.00)
Globulin, Total: 2.2 g/dL (ref 1.5–4.5)
Glucose: 239 mg/dL — ABNORMAL HIGH (ref 70–99)
Potassium: 4.8 mmol/L (ref 3.5–5.2)
Sodium: 141 mmol/L (ref 134–144)
Total Protein: 6.5 g/dL (ref 6.0–8.5)
eGFR: 44 mL/min/{1.73_m2} — ABNORMAL LOW (ref >59)

## 2024-05-12 LAB — TSH+T4F+T3FREE
Free T4: 0.96 ng/dL (ref 0.82–1.77)
T3, Free: 2.6 pg/mL (ref 2.0–4.4)
TSH: 2.5 u[IU]/mL (ref 0.450–4.500)

## 2024-05-12 LAB — LIPID PANEL
Chol/HDL Ratio: 5 ratio — ABNORMAL HIGH (ref 0.0–4.4)
Cholesterol, Total: 248 mg/dL — ABNORMAL HIGH (ref 100–199)
HDL: 50 mg/dL (ref >39)
LDL Chol Calc (NIH): 159 mg/dL — ABNORMAL HIGH (ref 0–99)
Triglycerides: 212 mg/dL — ABNORMAL HIGH (ref 0–149)
VLDL Cholesterol Cal: 39 mg/dL (ref 5–40)

## 2024-05-12 LAB — HEMOGLOBIN A1C
Est. average glucose Bld gHb Est-mCnc: 148 mg/dL
Hgb A1c MFr Bld: 6.8 % — ABNORMAL HIGH (ref 4.8–5.6)

## 2024-05-13 ENCOUNTER — Ambulatory Visit: Payer: Self-pay | Admitting: Family Medicine

## 2024-05-24 ENCOUNTER — Other Ambulatory Visit (HOSPITAL_COMMUNITY): Payer: Self-pay

## 2024-05-27 ENCOUNTER — Other Ambulatory Visit (HOSPITAL_COMMUNITY): Payer: Self-pay

## 2024-05-28 ENCOUNTER — Other Ambulatory Visit (HOSPITAL_COMMUNITY): Payer: Self-pay

## 2024-05-28 ENCOUNTER — Other Ambulatory Visit: Payer: Self-pay

## 2024-05-28 MED ORDER — FUROSEMIDE 20 MG PO TABS
20.0000 mg | ORAL_TABLET | ORAL | 3 refills | Status: DC
  Filled 2024-05-28: qty 45, 90d supply, fill #0
  Filled 2024-09-16: qty 45, 90d supply, fill #1

## 2024-05-28 MED ORDER — INSULIN GLARGINE 100 UNIT/ML SOLOSTAR PEN: 18.0000 [IU] | PEN_INJECTOR | Freq: Every day | SUBCUTANEOUS | 2 refills | Status: DC

## 2024-05-29 ENCOUNTER — Other Ambulatory Visit (HOSPITAL_COMMUNITY): Payer: Self-pay

## 2024-06-01 ENCOUNTER — Other Ambulatory Visit (HOSPITAL_COMMUNITY): Payer: Self-pay

## 2024-06-03 ENCOUNTER — Other Ambulatory Visit (HOSPITAL_COMMUNITY): Payer: Self-pay

## 2024-06-05 ENCOUNTER — Other Ambulatory Visit (HOSPITAL_COMMUNITY): Payer: Self-pay

## 2024-06-05 MED ORDER — INSULIN GLARGINE 100 UNIT/ML SOLOSTAR PEN
18.0000 [IU] | PEN_INJECTOR | SUBCUTANEOUS | 2 refills | Status: DC
  Filled 2024-06-05 – 2024-09-16 (×2): qty 15, 30d supply, fill #0

## 2024-06-07 ENCOUNTER — Encounter: Payer: Self-pay | Admitting: Podiatry

## 2024-06-07 ENCOUNTER — Ambulatory Visit: Admitting: Podiatry

## 2024-06-07 ENCOUNTER — Other Ambulatory Visit (HOSPITAL_COMMUNITY): Payer: Self-pay

## 2024-06-07 DIAGNOSIS — B351 Tinea unguium: Secondary | ICD-10-CM

## 2024-06-07 DIAGNOSIS — E119 Type 2 diabetes mellitus without complications: Secondary | ICD-10-CM | POA: Diagnosis not present

## 2024-06-07 DIAGNOSIS — M79675 Pain in left toe(s): Secondary | ICD-10-CM | POA: Diagnosis not present

## 2024-06-07 DIAGNOSIS — M79674 Pain in right toe(s): Secondary | ICD-10-CM

## 2024-06-07 NOTE — Progress Notes (Signed)
  Subjective:  Patient ID: Jasmine Webster, female    DOB: [birth year],  MRN: [id number]  Jasmine Webster presents to clinic today for preventative diabetic foot care and painful, elongated thickened toenails x 10 which are symptomatic when wearing enclosed shoe gear. This interferes with his/her daily activities. I will be celebrating my 89th birthday on Saturday. Chief Complaint  Patient presents with   RFC    Rm1 RFC/Dr. Davis Essex last visit June 24. 2025/ A1c 6.8   New problem(s): None.   PCP is Simmons-Robinson, Financial controller, MD.  Allergies  Allergen Reactions   Etodolac Nausea Only, Other (See Comments) and Anxiety    Dizziness, too   Naproxen Other (See Comments) and Anxiety    Gave her an ulcer after taking it for years   Statins Other (See Comments)    No energy and myalgias   Latex Rash   Terbinafine Other (See Comments) and Rash    Maybe made me nervous   Terbinafine Hcl Rash    Review of Systems: Negative except as noted in the HPI.  Objective: No changes noted in today's physical examination. There were no vitals filed for this visit. Jasmine Webster is a pleasant 89 y.o. female WD, WN in NAD. AAO x 3.  Vascular Examination: CFT <3 seconds b/l. DP/PT pulses faintly palpable b/l. Skin temperature gradient warm to warm b/l. No pain with calf compression. No ischemia or gangrene. No cyanosis or clubbing noted b/l. Pedal hair absent. No edema noted b/l LE.   Neurological Examination: Sensation grossly intact b/l with 10 gram monofilament. Vibratory sensation intact b/l.   Dermatological Examination: Pedal skin thin, shiny and atrophic b/l LE.  No open wounds b/l. No interdigital macerations b/l. Toenails 1-5 b/l elongated, thickened, discolored with subungual debris. +Tenderness with dorsal palpation of nailplates. No hyperkeratotic or porokeratotic lesions present.  Musculoskeletal Examination: Muscle strength 5/5 to all lower extremity muscle groups  bilaterally. No pain, crepitus or joint limitation noted with ROM b/l LE. HAV with bunion deformity noted b/l LE. Hammertoe(s) bilateral 2nd toes.. Patient ambulates with cane assistance.  Radiographs: None  Last A1c:      Latest Ref Rng & Units 05/11/2024   11:54 AM 01/12/2024    2:12 PM  Hemoglobin A1C  Hemoglobin-A1c 4.8 - 5.6 % 6.8  6.8     Assessment/Plan: 1. Pain due to onychomycosis of toenails of both feet   2. Diabetes mellitus without complication [facility])   Wished patient a happy birthday. Patient was evaluated and treated. All patient's and/or POA's questions/concerns addressed on today's visit. Toenails 1-5 debrided in length and girth without incident. Continue foot and shoe inspections daily. Monitor blood glucose per PCP/Endocrinologist's recommendations. Continue soft, supportive shoe gear daily. Report any pedal injuries to medical professional. Call office if there are any questions/concerns. Return in about 3 months (around 09/07/2024).  Jasmine Webster, DPM       LOCATION: [address], [place] [address]                   Office [phone]   [facility] LOCATION: [address] [place], [place] [address] Office [phone]

## 2024-06-09 ENCOUNTER — Inpatient Hospital Stay: Attending: Oncology

## 2024-06-09 DIAGNOSIS — D509 Iron deficiency anemia, unspecified: Secondary | ICD-10-CM | POA: Insufficient documentation

## 2024-06-09 DIAGNOSIS — D500 Iron deficiency anemia secondary to blood loss (chronic): Secondary | ICD-10-CM

## 2024-06-09 LAB — CBC WITH DIFFERENTIAL (CANCER CENTER ONLY)
Abs Immature Granulocytes: 0.02 K/uL (ref 0.00–0.07)
Basophils Absolute: 0.1 K/uL (ref 0.0–0.1)
Basophils Relative: 2 %
Eosinophils Absolute: 0.1 K/uL (ref 0.0–0.5)
Eosinophils Relative: 3 %
HCT: 35.5 % — ABNORMAL LOW (ref 36.0–46.0)
Hemoglobin: 11.6 g/dL — ABNORMAL LOW (ref 12.0–15.0)
Immature Granulocytes: 0 %
Lymphocytes Relative: 31 %
Lymphs Abs: 1.6 K/uL (ref 0.7–4.0)
MCH: 31.8 pg (ref 26.0–34.0)
MCHC: 32.7 g/dL (ref 30.0–36.0)
MCV: 97.3 fL (ref 80.0–100.0)
Monocytes Absolute: 0.6 K/uL (ref 0.1–1.0)
Monocytes Relative: 12 %
Neutro Abs: 2.8 K/uL (ref 1.7–7.7)
Neutrophils Relative %: 52 %
Platelet Count: 178 K/uL (ref 150–400)
RBC: 3.65 MIL/uL — ABNORMAL LOW (ref 3.87–5.11)
RDW: 12.7 % (ref 11.5–15.5)
WBC Count: 5.3 K/uL (ref 4.0–10.5)
nRBC: 0 % (ref 0.0–0.2)

## 2024-06-09 LAB — RETIC PANEL
Immature Retic Fract: 12.5 % (ref 2.3–15.9)
RBC.: 3.64 MIL/uL — ABNORMAL LOW (ref 3.87–5.11)
Retic Count, Absolute: 51 K/uL (ref 19.0–186.0)
Retic Ct Pct: 1.4 % (ref 0.4–3.1)
Reticulocyte Hemoglobin: 35.9 pg (ref >27.9)

## 2024-06-09 LAB — IRON AND TIBC
Iron: 88 ug/dL (ref 28–170)
Saturation Ratios: 31 % (ref 10.4–31.8)
TIBC: 284 ug/dL (ref 250–450)
UIBC: 196 ug/dL

## 2024-06-09 LAB — FERRITIN: Ferritin: 113 ng/mL (ref 11–307)

## 2024-06-11 ENCOUNTER — Inpatient Hospital Stay: Admitting: Oncology

## 2024-06-11 ENCOUNTER — Inpatient Hospital Stay

## 2024-06-17 ENCOUNTER — Other Ambulatory Visit (HOSPITAL_COMMUNITY): Payer: Self-pay

## 2024-06-17 ENCOUNTER — Other Ambulatory Visit: Payer: Self-pay

## 2024-06-17 ENCOUNTER — Other Ambulatory Visit: Payer: Self-pay | Admitting: Family Medicine

## 2024-06-17 ENCOUNTER — Other Ambulatory Visit (HOSPITAL_BASED_OUTPATIENT_CLINIC_OR_DEPARTMENT_OTHER): Payer: Self-pay

## 2024-06-17 DIAGNOSIS — N1832 Chronic kidney disease, stage 3b: Secondary | ICD-10-CM

## 2024-06-17 MED ORDER — TECHLITE PEN NEEDLES 31G X 8 MM MISC
12 refills | Status: DC
  Filled 2024-06-17: qty 100, 50d supply, fill #0
  Filled 2024-09-16: qty 100, 50d supply, fill #1

## 2024-06-23 ENCOUNTER — Other Ambulatory Visit (HOSPITAL_COMMUNITY): Payer: Self-pay

## 2024-06-28 ENCOUNTER — Other Ambulatory Visit (HOSPITAL_COMMUNITY): Payer: Self-pay

## 2024-06-28 MED FILL — Ezetimibe Tab 10 MG: ORAL | 90 days supply | Qty: 90 | Fill #1 | Status: AC

## 2024-06-28 MED FILL — Omeprazole Cap Delayed Release 40 MG: ORAL | 90 days supply | Qty: 90 | Fill #1 | Status: AC

## 2024-06-30 ENCOUNTER — Other Ambulatory Visit (HOSPITAL_COMMUNITY): Payer: Self-pay

## 2024-07-12 ENCOUNTER — Inpatient Hospital Stay: Attending: Oncology | Admitting: Oncology

## 2024-07-12 ENCOUNTER — Inpatient Hospital Stay

## 2024-07-12 ENCOUNTER — Other Ambulatory Visit (HOSPITAL_COMMUNITY): Payer: Self-pay

## 2024-07-12 ENCOUNTER — Encounter: Payer: Self-pay | Admitting: Oncology

## 2024-07-12 VITALS — BP 152/49 | HR 63 | Temp 97.3°F | Resp 16 | Wt 150.2 lb

## 2024-07-12 DIAGNOSIS — D631 Anemia in chronic kidney disease: Secondary | ICD-10-CM | POA: Insufficient documentation

## 2024-07-12 DIAGNOSIS — I110 Hypertensive heart disease with heart failure: Secondary | ICD-10-CM | POA: Insufficient documentation

## 2024-07-12 DIAGNOSIS — Z794 Long term (current) use of insulin: Secondary | ICD-10-CM | POA: Diagnosis not present

## 2024-07-12 DIAGNOSIS — Z801 Family history of malignant neoplasm of trachea, bronchus and lung: Secondary | ICD-10-CM | POA: Insufficient documentation

## 2024-07-12 DIAGNOSIS — R110 Nausea: Secondary | ICD-10-CM | POA: Insufficient documentation

## 2024-07-12 DIAGNOSIS — Z79899 Other long term (current) drug therapy: Secondary | ICD-10-CM | POA: Insufficient documentation

## 2024-07-12 DIAGNOSIS — N183 Chronic kidney disease, stage 3 unspecified: Secondary | ICD-10-CM

## 2024-07-12 DIAGNOSIS — D500 Iron deficiency anemia secondary to blood loss (chronic): Secondary | ICD-10-CM

## 2024-07-12 DIAGNOSIS — N189 Chronic kidney disease, unspecified: Secondary | ICD-10-CM | POA: Insufficient documentation

## 2024-07-12 DIAGNOSIS — D509 Iron deficiency anemia, unspecified: Secondary | ICD-10-CM | POA: Diagnosis present

## 2024-07-12 DIAGNOSIS — Z7982 Long term (current) use of aspirin: Secondary | ICD-10-CM | POA: Insufficient documentation

## 2024-07-12 MED ORDER — ONDANSETRON 4 MG PO TBDP
4.0000 mg | ORAL_TABLET | Freq: Three times a day (TID) | ORAL | 0 refills | Status: DC | PRN
  Filled 2024-07-12: qty 20, 6d supply, fill #0

## 2024-07-12 NOTE — Progress Notes (Signed)
 Hematology/Oncology Progress note [phone] [phone]        CHIEF COMPLAINTS/REASON FOR VISIT:  Anemia  ASSESSMENT & PLAN:  Iron  deficiency anemia Labs are reviewed and discussed with patient. Both hemoglobin and iron  panel have improved. Lab Results  Component Value Date   HGB 11.6 (L) 06/09/2024   TIBC 284 06/09/2024   IRONPCTSAT 31 06/09/2024   FERRITIN 113 06/09/2024    Hemoglobin has improved and ferritin is at goal.  Hold off IV Venofer . Continue Niferrex every other day  It is possible that she has occult GI bleeding.  She is at risk of developing AVM due to aortic stenosis.Previously discussed with GI, anesthetic risk is high for EGD/colonoscopy locally.  If patient develops severe recurrent IDA, she may need to go to tertiary center for further evaluation.  Anemia in chronic kidney disease (CKD) anemia secondary to chronic kidney disease, Hemoglobin is stable.   Nausea without vomiting Recommend trials of Zofran  4mg  Q8h PRN  Orders Placed This Encounter  Procedures   CBC with Differential (Cancer Center Only)    Standing Status:   Future    Expected Date:   10/12/2024    Expiration Date:   01/10/2025   Iron  and TIBC    Standing Status:   Future    Expected Date:   10/12/2024    Expiration Date:   01/10/2025   Ferritin    Standing Status:   Future    Expected Date:   10/12/2024    Expiration Date:   01/10/2025   Retic Panel    Standing Status:   Future    Expected Date:   10/12/2024    Expiration Date:   01/10/2025   Follow-up 3 months All questions were answered. The patient knows to call the clinic with any problems, questions or concerns.  Zelphia Cap, MD, PhD [facility] Hematology Oncology 07/12/2024     HISTORY OF PRESENTING ILLNESS:  Jasmine Webster is a  89 y.o.  female with PMH listed below who was referred to me for anemia Reviewed patient's recent labs that was done.   Reviewed patient's previous labs ordered by  primary care physician's office, anemia is chronic onset , duration is since January 2023. Patient reports very fatigued, lightheaded, shortness of breath with minimal exertion. She denies recent chest pain on exertion, pre-syncopal episodes, or palpitations She had not noticed any recent bleeding such as epistaxis, hematuria or hematochezia.  +Dark stool She denies over the counter NSAID ingestion. She is on antiplatelets agent-aspirin  81 mg. She denies any pica and eats a variety of diet.  She was hospitalized from 05/21/2022 - 05/24/2022, due to NSTEMI and acute CHF exacerbation, symptomatic anemia, status post 2 unit of PRBC transfusion.  Patient reports feeling better after blood transfusion. Cardiology recommends no invasive intervention during this admission.  There is plan for possible TAVR   INTERVAL HISTORY Jasmine Webster is a 89 y.o. female who has above history reviewed by me today presents for acute visit for iron  deficiency anemia Patient is accompanied by her family member.  Fatigue has improved.  Denies any blood in the stool.  She takes Niferex every other day. Family reports that she sometimes feel nausea.   MEDICAL HISTORY:  Past Medical History:  Diagnosis Date   Arthritis    Cataract    cataract removal bilaterally approx 20 years ago   Chronic back pain    COVID-19 virus infection 12/2020   Diabetes mellitus without complication (HCC)  GI bleed    Hyperlipidemia    Hypertension    Iron  deficiency anemia    Non-obstructive CAD (coronary artery disease)    a. 04/2022 Cath: LM 10ost, LAD 30p, RI 60, LCX min irregs, LPAV min irregs, RCA min irregs.   Severe aortic stenosis    a. 05/2022 Echo: EF 55-60%, no rwma, mild LVH, GrII DD, nl RV fxn, mild MR, mild-mod MS, Sev AS (mean grad 33, peak grad 62, AVA 0.6cm^2).    SURGICAL HISTORY: Past Surgical History:  Procedure Laterality Date   ABDOMINAL HYSTERECTOMY     APPENDECTOMY     BACK SURGERY     three    CARDIAC CATHETERIZATION     6+yrs no stents Dr. Morgan Morita   CATARACT EXTRACTION     RIGHT/LEFT HEART CATH AND CORONARY ANGIOGRAPHY N/A 04/26/2022   Procedure: RIGHT/LEFT HEART CATH AND CORONARY ANGIOGRAPHY;  Surgeon: Darron Deatrice LABOR, MD;  Location: [facility] INVASIVE CV LAB;  Service: Cardiovascular;  Laterality: N/A;   THORACOLUMBAR SYRINGO SHUNT     TONSILLECTOMY AND ADENOIDECTOMY      SOCIAL HISTORY: Social History   Socioeconomic History   Marital status: Widowed    Spouse name: Not on file   Number of children: 2   Years of education: Not on file   Highest education level: 8th grade  Occupational History   Occupation: retired  Tobacco Use   Smoking status: Never   Smokeless tobacco: Never  Vaping Use   Vaping status: Never Used  Substance and Sexual Activity   Alcohol use: No    Alcohol/week: 0.0 standard drinks of alcohol   Drug use: No   Sexual activity: Not Currently  Other Topics Concern   Not on file  Social History Narrative   Lives with daughter, Darice Seats   Social Drivers of Health   Financial Resource Strain: Low Risk  (01/12/2024)   Overall Financial Resource Strain (CARDIA)    Difficulty of Paying Living Expenses: Not hard at all  Food Insecurity: No Food Insecurity (01/12/2024)   Hunger Vital Sign    Worried About Running Out of Food in the Last Year: Never true    Ran Out of Food in the Last Year: Never true  Transportation Needs: No Transportation Needs (01/12/2024)   PRAPARE - Administrator, Civil Service (Medical): No    Lack of Transportation (Non-Medical): No  Physical Activity: Inactive (01/12/2024)   Exercise Vital Sign    Days of Exercise per Week: 0 days    Minutes of Exercise per Session: 0 min  Stress: No Stress Concern Present (01/12/2024)   [employer] of Occupational Health - Occupational Stress Questionnaire    Feeling of Stress : Not at all  Social Connections: Moderately Isolated (01/12/2024)   Social  Connection and Isolation Panel    Frequency of Communication with Friends and Family: More than three times a week    Frequency of Social Gatherings with Friends and Family: More than three times a week    Attends Religious Services: More than 4 times per year    Active Member of [employer] or Organizations: No    Attends Banker Meetings: Never    Marital Status: Never married  Intimate Partner Violence: Not At Risk (01/12/2024)   Humiliation, Afraid, Rape, and Kick questionnaire    Fear of Current or Ex-Partner: No    Emotionally Abused: No    Physically Abused: No    Sexually Abused: No  FAMILY HISTORY: Family History  Problem Relation Age of Onset   Heart disease Mother        died from MI   Heart attack Mother    Heart disease Sister    Sudden death Brother        shot and beaten to death during home invasion   Stroke Sister    Sudden death Sister        MVA   Cancer Brother        died from lung cancer   Sudden death Sister        88 days old   Cancer Brother        died from lung cancer   Stroke Brother        cause of death   Diabetes Father    Stroke Father    Pneumonia Father     ALLERGIES:  is allergic to etodolac, naproxen, statins, latex, terbinafine, and terbinafine hcl.  MEDICATIONS:  Current Outpatient Medications  Medication Sig Dispense Refill   Artificial Tear Ointment (DRY EYES OP) Apply 1 drop to eye daily.     ascorbic acid  (VITAMIN C ) 250 MG tablet Take 1 tablet (250 mg total) by mouth daily. 30 tablet 1   cetirizine  (ZYRTEC ) 10 MG tablet Take 1 tablet (10 mg total) by mouth daily. 30 tablet 11   co-enzyme Q-10 30 MG capsule Take 30 mg by mouth daily.     cyanocobalamin  (VITAMIN B12) 500 MCG tablet Take 1 tablet (500 mcg total) by mouth once a week. 30 tablet 1   ezetimibe  (ZETIA ) 10 MG tablet TAKE 1 TABLET BY MOUTH EVERY DAY 90 tablet 3   furosemide  (LASIX ) 20 MG tablet Take 1 tablet (20 mg total) by mouth every other day. 45 tablet 3    glucose blood test strip CHECK SUGAR TWICE A DAY 200 each 3   insulin  glargine (LANTUS ) 100 UNIT/ML Solostar Pen Inject 18 Units into the skin. 15 mL 2   insulin  glargine-yfgn (SEMGLEE , YFGN,) 100 UNIT/ML Pen INJECT 18 UNITS SUBCUTANEOUSLY DAILY AS DIRECTED 9 mL 2   Insulin  Pen Needle (TECHLITE PEN NEEDLES) 31G X 8 MM MISC CHECK SUGAR 2 TIMES DAILY 100 each 12   iron  polysaccharides (NIFEREX) 150 MG capsule Take 1 capsule (150 mg total) by mouth every other day. 45 capsule 1   lisinopril  (ZESTRIL ) 10 MG tablet Take 1 tablet (10 mg total) by mouth daily. 90 tablet 0   Magnesium  400 MG CAPS Take 400 mg by mouth daily.     Multiple Vitamins-Minerals (PRESERVISION AREDS) CAPS Take 1 capsule by mouth 2 (two) times daily.      Omega-3 Fatty Acids (FISH OIL PO) Take by mouth daily.     omeprazole  (PRILOSEC) 40 MG capsule Take 1 capsule (40 mg total) by mouth daily. 90 capsule 3   ondansetron  (ZOFRAN -ODT) 4 MG disintegrating tablet Take 1 tablet (4 mg total) by mouth every 8 (eight) hours as needed for nausea or vomiting. 20 tablet 0   ONETOUCH ULTRA test strip CHECK SUGAR TWICE A DAY 200 strip 3   OneTouch UltraSoft 2 Lancets MISC USE AS INSTRUCTED 100 each 12   potassium chloride  (KLOR-CON ) 10 MEQ tablet Take 1 tablet (10 mEq total) by mouth daily as needed. Take with furosemide . 90 tablet 1   silver  sulfADIAZINE  (SILVADENE ) 1 % cream APPLY 1 APPLICATION TOPICALLY DAILY 50 g 0   Turmeric 500 MG CAPS Take by mouth daily.     fluticasone  (FLONASE )  50 MCG/ACT nasal spray Place 2 sprays into both nostrils daily. (Patient not taking: Reported on 07/12/2024) 16 g 6   furosemide  (LASIX ) 20 MG tablet TAKE 1 TABLET BY MOUTH EVERY OTHER DAY (Patient not taking: Reported on 07/12/2024) 45 tablet 3   nitroGLYCERIN  (NITROSTAT ) 0.4 MG SL tablet Place 1 tablet (0.4 mg total) under the tongue every 5 (five) minutes as needed for chest pain. (Patient not taking: Reported on 07/12/2024) 25 tablet 3   No current  facility-administered medications for this visit.   Facility-Administered Medications Ordered in Other Visits  Medication Dose Route Frequency Provider Last Rate Last Admin   0.9 %  sodium chloride  infusion   Intravenous Continuous Babara Call, MD 20 mL/hr at 12/11/22 1458 New Bag at 12/11/22 1458    Review of Systems  Constitutional:  Positive for fatigue. Negative for chills and fever.  HENT:   Negative for hearing loss and voice change.   Eyes:  Negative for eye problems.  Respiratory:  Negative for chest tightness, cough and shortness of breath.   Cardiovascular:  Negative for chest pain.  Gastrointestinal:  Negative for abdominal distention and abdominal pain.  Endocrine: Negative for hot flashes.  Genitourinary:  Negative for difficulty urinating and frequency.   Musculoskeletal:  Negative for arthralgias.  Skin:  Negative for itching and rash.  Neurological:  Negative for extremity weakness and light-headedness.  Hematological:  Negative for adenopathy.  Psychiatric/Behavioral:  Negative for confusion.     PHYSICAL EXAMINATION: ECOG PERFORMANCE STATUS: 2 - Symptomatic, <50% confined to bed Vitals:   07/12/24 1302 07/12/24 1307  BP: (!) 153/49 (!) 152/49  Pulse: 63   Resp: 16   Temp: (!) 97.3 F (36.3 C)   SpO2: 99%    Physical Exam Constitutional:      General: She is not in acute distress.    Comments: Patient sits in a wheelchair  HENT:     Head: Normocephalic and atraumatic.  Eyes:     General: No scleral icterus. Cardiovascular:     Rate and Rhythm: Normal rate and regular rhythm.     Heart sounds: Murmur heard.  Pulmonary:     Effort: Pulmonary effort is normal. No respiratory distress.     Breath sounds: No wheezing.  Abdominal:     General: Bowel sounds are normal. There is no distension.     Palpations: Abdomen is soft.  Musculoskeletal:        General: No deformity. Normal range of motion.     Cervical back: Normal range of motion and neck supple.      Comments: Trace edema bilaterally  Skin:    General: Skin is warm and dry.     Coloration: Skin is pale.     Findings: No erythema or rash.  Neurological:     Mental Status: She is alert and oriented to person, place, and time. Mental status is at baseline.     Cranial Nerves: No cranial nerve deficit.  Psychiatric:        Mood and Affect: Mood normal.      LABORATORY DATA:  I have reviewed the data as listed    Latest Ref Rng & Units 06/09/2024   10:59 AM 02/09/2024   10:57 AM 11/05/2023    1:34 PM  CBC  WBC 4.0 - 10.5 K/uL 5.3  6.4  7.7   Hemoglobin 12.0 - 15.0 g/dL 88.3  88.0  9.7   Hematocrit 36.0 - 46.0 % 35.5  37.6  31.6  Platelets 150 - 400 K/uL 178  197  282       Latest Ref Rng & Units 05/11/2024   11:54 AM 10/29/2023   10:49 AM 03/07/2023    2:51 PM  CMP  Glucose 70 - 99 mg/dL 760   883   BUN 10 - 36 mg/dL 29   18   Creatinine 9.42 - 1.00 mg/dL 8.84   9.01   Sodium 865 - 144 mmol/L 141   134   Potassium 3.5 - 5.2 mmol/L 4.8   4.2   Chloride 96 - 106 mmol/L 102   100   CO2 20 - 29 mmol/L 19   26   Calcium  8.7 - 10.3 mg/dL 9.2   8.7   Total Protein 6.0 - 8.5 g/dL 6.5  5.8    Total Bilirubin 0.0 - 1.2 mg/dL 0.3  0.8    Alkaline Phos 44 - 121 IU/L 146  99    AST 0 - 40 IU/L 21  23    ALT 0 - 32 IU/L 14  13        Component Value Date/Time   IRON  88 06/09/2024 1059   IRON  36 12/04/2021 1302   TIBC 284 06/09/2024 1059   TIBC 327 12/04/2021 1302   FERRITIN 113 06/09/2024 1059   FERRITIN 115 10/02/2022 1305   IRONPCTSAT 31 06/09/2024 1059   IRONPCTSAT 11 (L) 12/04/2021 1302     RADIOGRAPHIC STUDIES: I have personally reviewed the radiological images as listed and agreed with the findings in the report. No results found.

## 2024-07-12 NOTE — Assessment & Plan Note (Signed)
 anemia secondary to chronic kidney disease, Hemoglobin is stable.

## 2024-07-12 NOTE — Assessment & Plan Note (Addendum)
 Labs are reviewed and discussed with patient. Both hemoglobin and iron  panel have improved. Lab Results  Component Value Date   HGB 11.6 (L) 06/09/2024   TIBC 284 06/09/2024   IRONPCTSAT 31 06/09/2024   FERRITIN 113 06/09/2024    Hemoglobin has improved and ferritin is at goal.  Hold off IV Venofer . Continue Niferrex every other day  It is possible that she has occult GI bleeding.  She is at risk of developing AVM due to aortic stenosis.Previously discussed with GI, anesthetic risk is high for EGD/colonoscopy locally.  If patient develops severe recurrent IDA, she may need to go to tertiary center for further evaluation.

## 2024-07-12 NOTE — Assessment & Plan Note (Signed)
 Recommend trials of Zofran  4mg  Q8h PRN

## 2024-07-13 ENCOUNTER — Other Ambulatory Visit (HOSPITAL_COMMUNITY): Payer: Self-pay

## 2024-07-13 ENCOUNTER — Encounter: Payer: Self-pay | Admitting: Oncology

## 2024-07-14 ENCOUNTER — Other Ambulatory Visit: Payer: Self-pay

## 2024-07-14 ENCOUNTER — Other Ambulatory Visit (HOSPITAL_COMMUNITY): Payer: Self-pay

## 2024-08-05 ENCOUNTER — Other Ambulatory Visit: Payer: Self-pay | Admitting: Family Medicine

## 2024-08-05 ENCOUNTER — Other Ambulatory Visit (HOSPITAL_COMMUNITY): Payer: Self-pay

## 2024-08-05 ENCOUNTER — Other Ambulatory Visit: Payer: Self-pay | Admitting: Oncology

## 2024-08-05 DIAGNOSIS — E119 Type 2 diabetes mellitus without complications: Secondary | ICD-10-CM

## 2024-08-06 ENCOUNTER — Encounter: Payer: Self-pay | Admitting: Oncology

## 2024-08-06 ENCOUNTER — Other Ambulatory Visit (HOSPITAL_COMMUNITY): Payer: Self-pay

## 2024-08-06 ENCOUNTER — Other Ambulatory Visit: Payer: Self-pay

## 2024-08-06 MED ORDER — LISINOPRIL 10 MG PO TABS
10.0000 mg | ORAL_TABLET | Freq: Every day | ORAL | 1 refills | Status: DC
  Filled 2024-08-06: qty 90, 90d supply, fill #0
  Filled 2024-11-04: qty 90, 90d supply, fill #1
  Filled 2024-11-08: qty 90, 90d supply, fill #0

## 2024-08-06 MED ORDER — POLYSACCHARIDE IRON COMPLEX 150 MG PO CAPS
150.0000 mg | ORAL_CAPSULE | ORAL | 1 refills | Status: DC
  Filled 2024-08-06: qty 45, 90d supply, fill #0
  Filled 2024-11-04: qty 45, 90d supply, fill #1
  Filled 2024-11-08: qty 45, 90d supply, fill #0

## 2024-08-23 ENCOUNTER — Other Ambulatory Visit: Payer: Self-pay

## 2024-08-23 ENCOUNTER — Other Ambulatory Visit (HOSPITAL_COMMUNITY): Payer: Self-pay

## 2024-08-23 ENCOUNTER — Ambulatory Visit

## 2024-08-23 ENCOUNTER — Encounter: Payer: Self-pay | Admitting: Oncology

## 2024-08-23 DIAGNOSIS — B351 Tinea unguium: Secondary | ICD-10-CM

## 2024-08-23 DIAGNOSIS — L601 Onycholysis: Secondary | ICD-10-CM

## 2024-08-23 DIAGNOSIS — L603 Nail dystrophy: Secondary | ICD-10-CM

## 2024-08-23 MED ORDER — CICLOPIROX 8 % EX SOLN
Freq: Every day | CUTANEOUS | 0 refills | Status: DC
  Filled 2024-08-23: qty 6.6, 30d supply, fill #0

## 2024-08-23 NOTE — Progress Notes (Signed)
    Subjective   Jasmine Webster is a 89 y.o. female who presents for the following: Issue with fungernail. Patient is new patient  Today patient reports: Discoloration of first finger of right hand for about 2 months   Review of Systems:    No other skin or systemic complaints except as noted in HPI or Assessment and Plan.  The following portions of the chart were reviewed this encounter and updated as appropriate: medications, allergies, medical history  Relevant Medical History:  n/a   Objective  Well appearing patient in no apparent distress; mood and affect are within normal limits. Examination was performed of the: Fingernails of bilateral hands Examination notable for: onycholysis: Yellow discoloration, thickening, and subungual debris of the right thumb   Examination limited by: Clothing and Patient deferred removal       Assessment & Plan   Thickening, yellowing, onycholysis of the right thumb - favor onychomycosis  - Counseled patient about etiology, natural history, and treatment options for the condition - Nail Fugal ID test completed in office - Start penlac 8% to nail    Recommended:  - Please keep nails as short as comfortable to prevent lever effect, snagging, and further lifting. - Avoid excessive hand washing or prolonged contact in water as much as possible. - Wear light cotton gloves under vinyl gloves for wet work. - Avoid touching citrus fruits or raw food with bare hands. - Avoid trauma to the nail -- no cleaning under the nail edge.  - Best to avoid nail cosmetics until the nail has looked normal for at least a month. Nail polish remover can be particularly harsh on the nails. If want to restart at that point, start with a clear coat first. Best to avoid nail hardeners, all formaldehyde containing products, acrylic or gel nails.  Chronic and persistent condition with duration or expected duration over one year. Condition is symptomatic and bothersome to  patient. Patient is flaring and not currently at treatment goal.   Procedures, orders, diagnosis for this visit:    There are no diagnoses linked to this encounter.  Return to clinic: Return if symptoms worsen or fail to improve.  Documentation: I have reviewed the above documentation for accuracy and completeness, and I agree with the above.  Lauraine JAYSON Kanaris, MD

## 2024-08-23 NOTE — Patient Instructions (Addendum)
 - Start ciclopirox (PENLAC) 8 % solution   Apply topically at bedtime. Apply over nail and surrounding skin. Apply daily over previous coat. After seven (7) days, may remove with alcohol and continue cycle.    Due to recent changes in healthcare laws, you may see results of your pathology and/or laboratory studies on MyChart before the doctors have had a chance to review them. We understand that in some cases there may be results that are confusing or concerning to you. Please understand that not all results are received at the same time and often the doctors may need to interpret multiple results in order to provide you with the best plan of care or course of treatment. Therefore, we ask that you please give us  2 business days to thoroughly review all your results before contacting the office for clarification. Should we see a critical lab result, you will be contacted sooner.   If You Need Anything After Your Visit  If you have any questions or concerns for your doctor, please call our main line at [phone] and press option 4 to reach your doctor's medical assistant. If no one answers, please leave a voicemail as directed and we will return your call as soon as possible. Messages left after 4 pm will be answered the following business day.   You may also send us  a message via MyChart. We typically respond to MyChart messages within 1-2 business days.  For prescription refills, please ask your pharmacy to contact our office. Our fax number is [phone].  If you have an urgent issue when the clinic is closed that cannot wait until the next business day, you can page your doctor at the number below.    Please note that while we do our best to be available for urgent issues outside of office hours, we are not available 24/7.   If you have an urgent issue and are unable to reach us , you may choose to seek medical care at your doctor's office, retail clinic, urgent care center, or emergency  room.  If you have a medical emergency, please immediately call 911 or go to the emergency department.  Pager Numbers  - Dr. Hester: [phone]  - Dr. Jackquline: [phone]  - Dr. Claudene: [phone]   - Dr. Raymund: [phone]  In the event of inclement weather, please call our main line at [phone] for an update on the status of any delays or closures.  Dermatology Medication Tips: Please keep the boxes that topical medications come in in order to help keep track of the instructions about where and how to use these. Pharmacies typically print the medication instructions only on the boxes and not directly on the medication tubes.   If your medication is too expensive, please contact our office at [phone] option 4 or send us  a message through MyChart.   We are unable to tell what your co-pay for medications will be in advance as this is different depending on your insurance coverage. However, we may be able to find a substitute medication at lower cost or fill out paperwork to get insurance to cover a needed medication.   If a prior authorization is required to get your medication covered by your insurance company, please allow us  1-2 business days to complete this process.  Drug prices often vary depending on where the prescription is filled and some pharmacies may offer cheaper prices.  The website www.goodrx.com contains coupons for medications through different pharmacies. The prices here do not account  for what the cost may be with help from insurance (it may be cheaper with your insurance), but the website can give you the price if you did not use any insurance.  - You can print the associated coupon and take it with your prescription to the pharmacy.  - You may also stop by our office during regular business hours and pick up a GoodRx coupon card.  - If you need your prescription sent electronically to a different pharmacy, notify our office through [facility] or by phone at [phone] option 4.     Si Usted Necesita Algo Despus de Su Visita  Tambin puede enviarnos un mensaje a travs de Clinical cytogeneticist. Por lo general respondemos a los mensajes de MyChart en el transcurso de 1 a 2 das hbiles.  Para renovar recetas, por favor pida a su farmacia que se ponga en contacto con nuestra oficina. Randi lakes de fax es [place] [phone].  Si tiene un asunto urgente cuando la clnica est cerrada y que no puede esperar hasta el siguiente da hbil, puede llamar/localizar a su doctor(a) al nmero que aparece a continuacin.   Por favor, tenga en cuenta que aunque hacemos todo lo posible para estar disponibles para asuntos urgentes fuera del horario de [place], no estamos disponibles las 24 horas del da, los 7 [address]  [address] de la [place].   Si tiene un problema urgente y no puede comunicarse con nosotros, puede optar por buscar atencin mdica  en el consultorio de su doctor(a), en una clnica privada, en un centro de atencin urgente o en una sala de emergencias.  Si tiene Engineer, drilling, por favor llame inmediatamente al 911 o vaya a la sala de emergencias.  Nmeros de bper  - Dr. Hester: [phone]  - Dra. Jackquline: [phone]  - Dr. Claudene: [phone]  - Dra. Kitts: [phone]  En caso de inclemencias del [place], por favor llame a nuestra lnea principal al [phone] para una actualizacin sobre el estado de cualquier retraso o cierre.  Consejos para la medicacin en dermatologa: Por favor, guarde las cajas en las que vienen los medicamentos de uso tpico para ayudarle a seguir las instrucciones sobre dnde y cmo usarlos. Las farmacias generalmente imprimen las instrucciones del medicamento slo en las cajas y no directamente en los tubos del [place].   Si su medicamento es muy caro, por favor, pngase en contacto con landry rieger llamando al [phone] y presione la opcin 4 o envenos un mensaje a travs de Clinical cytogeneticist.    No podemos decirle cul ser su copago por los medicamentos por adelantado ya que esto es diferente dependiendo de la cobertura de su seguro. Sin embargo, es posible que podamos encontrar un medicamento sustituto a Audiological scientist un formulario para que el seguro cubra el medicamento que se considera necesario.   Si se requiere una autorizacin previa para que su compaa de seguros malta su medicamento, por favor permtanos de 1 a 2 das hbiles para completar este proceso.  Los precios de los medicamentos varan con frecuencia dependiendo del Environmental consultant de dnde se surte la receta y alguna farmacias pueden ofrecer precios ms baratos.  El sitio web www.goodrx.com tiene cupones para medicamentos de Health and safety inspector. Los precios aqu no tienen en cuenta lo que podra costar con la ayuda del seguro (puede ser ms barato con su seguro), pero el sitio web puede darle el precio si no utiliz Tourist information centre manager.  - Puede imprimir el cupn correspondiente y llevarlo con su receta a  la farmacia.  - Tambin puede pasar por nuestra oficina durante el horario de atencin regular y Education officer, museum una tarjeta de cupones de GoodRx.  - Si necesita que su receta se enve electrnicamente a una farmacia diferente, informe a nuestra oficina a travs de MyChart de [place] o por telfono llamando al [phone] y presione la opcin 4.

## 2024-09-06 ENCOUNTER — Ambulatory Visit: Admitting: Podiatry

## 2024-09-06 DIAGNOSIS — B351 Tinea unguium: Secondary | ICD-10-CM

## 2024-09-06 DIAGNOSIS — M79675 Pain in left toe(s): Secondary | ICD-10-CM | POA: Diagnosis not present

## 2024-09-06 DIAGNOSIS — M79674 Pain in right toe(s): Secondary | ICD-10-CM

## 2024-09-06 DIAGNOSIS — N184 Chronic kidney disease, stage 4 (severe): Secondary | ICD-10-CM

## 2024-09-06 DIAGNOSIS — Z794 Long term (current) use of insulin: Secondary | ICD-10-CM

## 2024-09-06 DIAGNOSIS — E1122 Type 2 diabetes mellitus with diabetic chronic kidney disease: Secondary | ICD-10-CM

## 2024-09-09 ENCOUNTER — Ambulatory Visit: Payer: Self-pay

## 2024-09-09 NOTE — Telephone Encounter (Signed)
 No openings at PCP office today nor with any surrounding PCP offices within the region Granddaughter is taking patient to [facility] at this time  FYI Only or Action Required?: FYI only for provider.  Patient was last seen in primary care on 05/11/2024 by Sharma Coyer, MD.  Called Nurse Triage reporting Fatigue.  Symptoms began 2 days ago.  Interventions attempted: Rest, hydration, or home remedies.  Symptoms are: gradually worsening.  Triage Disposition: See HCP Within 4 Hours (Or PCP Triage)  Patient/caregiver understands and will follow disposition?: Yes---patient is going to [facility] at this time                  Copied from CRM #[id number]. Topic: Clinical - Red Word Triage >> Sep 09, 2024  1:07 PM Myrick T wrote: Red Word that prompted transfer to Nurse Triage: granddaughter stated patient very weak, lethargic and has no voice. She is just not feeling her best Reason for Disposition  [1] MODERATE weakness or fatigue AND [2] from poor fluid intake AND [3] no improvement after 2 hours of rest and fluids  Answer Assessment - Initial Assessment Questions Granddaughter Mallory called in and she is currently with her grandmother (the patient) and they are in the car at this time. She states that the patient didn't feel good for two days---generalized malaise Lost her voice yesterday but didn't really have a sore throat They haven't been around anyone who is sick that they are aware of  Today--the patient told her granddaughter that she wants to go to urgent care to be evaluated---patient's daughter suggested [facility] No fever, chest pain, difficulty breathing, vomiting Patient does endorse some nausea They wanted to check and see if her PCP office had any openings before going to Urgent Care. There are no openings at patient's PCP office today or in the surrounding region of PCP offices This RN advised her that  if they were already on the way to Urgent Care at this time t  Patient's granddaughter is advised to call us  back if anything changes or with any further questions/concerns. She is advised that if anything worsens to go to the Emergency Room or call 911. She verbalized understanding.  Protocols used: Weakness (Generalized) and Fatigue-A-AH

## 2024-09-10 NOTE — Telephone Encounter (Signed)
 Reviewed telephone report   Agree with urgent evaluation as recommended

## 2024-09-11 ENCOUNTER — Emergency Department
Admission: EM | Admit: 2024-09-11 | Discharge: 2024-09-11 | Disposition: A | Discharge status: Home / Self Care | Attending: Emergency Medicine | Admitting: Emergency Medicine

## 2024-09-11 ENCOUNTER — Other Ambulatory Visit: Payer: Self-pay

## 2024-09-11 ENCOUNTER — Encounter: Payer: Self-pay | Admitting: Emergency Medicine

## 2024-09-11 ENCOUNTER — Emergency Department

## 2024-09-11 DIAGNOSIS — E041 Nontoxic single thyroid nodule: Secondary | ICD-10-CM | POA: Insufficient documentation

## 2024-09-11 DIAGNOSIS — I509 Heart failure, unspecified: Secondary | ICD-10-CM | POA: Diagnosis not present

## 2024-09-11 DIAGNOSIS — J3800 Paralysis of vocal cords and larynx, unspecified: Secondary | ICD-10-CM | POA: Insufficient documentation

## 2024-09-11 DIAGNOSIS — I130 Hypertensive heart and chronic kidney disease with heart failure and stage 1 through stage 4 chronic kidney disease, or unspecified chronic kidney disease: Secondary | ICD-10-CM | POA: Insufficient documentation

## 2024-09-11 DIAGNOSIS — E1122 Type 2 diabetes mellitus with diabetic chronic kidney disease: Secondary | ICD-10-CM | POA: Insufficient documentation

## 2024-09-11 DIAGNOSIS — N189 Chronic kidney disease, unspecified: Secondary | ICD-10-CM | POA: Insufficient documentation

## 2024-09-11 DIAGNOSIS — R0989 Other specified symptoms and signs involving the circulatory and respiratory systems: Secondary | ICD-10-CM | POA: Insufficient documentation

## 2024-09-11 DIAGNOSIS — E042 Nontoxic multinodular goiter: Secondary | ICD-10-CM | POA: Diagnosis not present

## 2024-09-11 DIAGNOSIS — E049 Nontoxic goiter, unspecified: Secondary | ICD-10-CM | POA: Diagnosis not present

## 2024-09-11 DIAGNOSIS — Q892 Congenital malformations of other endocrine glands: Secondary | ICD-10-CM | POA: Diagnosis not present

## 2024-09-11 DIAGNOSIS — R1310 Dysphagia, unspecified: Secondary | ICD-10-CM | POA: Diagnosis present

## 2024-09-11 DIAGNOSIS — J3801 Paralysis of vocal cords and larynx, unilateral: Secondary | ICD-10-CM | POA: Insufficient documentation

## 2024-09-11 LAB — CBC WITH DIFFERENTIAL/PLATELET
Abs Immature Granulocytes: 0.02 K/uL (ref 0.00–0.07)
Basophils Absolute: 0.1 K/uL (ref 0.0–0.1)
Basophils Relative: 1 %
Eosinophils Absolute: 0.1 K/uL (ref 0.0–0.5)
Eosinophils Relative: 1 %
HCT: 36.2 % (ref 36.0–46.0)
Hemoglobin: 11.8 g/dL — ABNORMAL LOW (ref 12.0–15.0)
Immature Granulocytes: 0 %
Lymphocytes Relative: 24 %
Lymphs Abs: 1.3 K/uL (ref 0.7–4.0)
MCH: 31.5 pg (ref 26.0–34.0)
MCHC: 32.6 g/dL (ref 30.0–36.0)
MCV: 96.5 fL (ref 80.0–100.0)
Monocytes Absolute: 0.5 K/uL (ref 0.1–1.0)
Monocytes Relative: 9 %
Neutro Abs: 3.6 K/uL (ref 1.7–7.7)
Neutrophils Relative %: 65 %
Platelets: 178 K/uL (ref 150–400)
RBC: 3.75 MIL/uL — ABNORMAL LOW (ref 3.87–5.11)
RDW: 12.6 % (ref 11.5–15.5)
WBC: 5.5 K/uL (ref 4.0–10.5)
nRBC: 0 % (ref 0.0–0.2)

## 2024-09-11 LAB — RESP PANEL BY RT-PCR (RSV, FLU A&B, COVID)  RVPGX2
Influenza A by PCR: NEGATIVE
Influenza B by PCR: NEGATIVE
Resp Syncytial Virus by PCR: NEGATIVE
SARS Coronavirus 2 by RT PCR: NEGATIVE

## 2024-09-11 LAB — BASIC METABOLIC PANEL WITH GFR
Anion gap: 8 (ref 5–15)
BUN: 25 mg/dL — ABNORMAL HIGH (ref 8–23)
CO2: 24 mmol/L (ref 22–32)
Calcium: 8.5 mg/dL — ABNORMAL LOW (ref 8.9–10.3)
Chloride: 103 mmol/L (ref 98–111)
Creatinine, Ser: 1.05 mg/dL — ABNORMAL HIGH (ref 0.44–1.00)
GFR, Estimated: 49 mL/min — ABNORMAL LOW (ref >60)
Glucose, Bld: 202 mg/dL — ABNORMAL HIGH (ref 70–99)
Potassium: 4.5 mmol/L (ref 3.5–5.1)
Sodium: 135 mmol/L (ref 135–145)

## 2024-09-11 LAB — T4, FREE: Free T4: 0.78 ng/dL (ref 0.61–1.12)

## 2024-09-11 LAB — TSH: TSH: 3.05 u[IU]/mL (ref 0.350–4.500)

## 2024-09-11 MED ORDER — VALACYCLOVIR 50 MG/ML ORAL SUSPENSION: 1000.0000 mg | Freq: Once | ORAL | Status: DC

## 2024-09-11 MED ORDER — PREDNISONE 20 MG PO TABS
60.0000 mg | ORAL_TABLET | Freq: Every day | ORAL | 0 refills | Status: AC
  Filled 2024-09-11: qty 21, 7d supply, fill #0

## 2024-09-11 MED ORDER — IOHEXOL 300 MG/ML  SOLN
75.0000 mL | Freq: Once | INTRAMUSCULAR | Status: AC | PRN
  Administered 2024-09-11: 75 mL via INTRAVENOUS

## 2024-09-11 MED ORDER — PREDNISONE 20 MG PO TABS
60.0000 mg | ORAL_TABLET | Freq: Once | ORAL | Status: AC
  Administered 2024-09-11: 60 mg via ORAL
  Filled 2024-09-11: qty 3

## 2024-09-11 MED ORDER — VALACYCLOVIR HCL 1 G PO TABS
1000.0000 mg | ORAL_TABLET | Freq: Three times a day (TID) | ORAL | 0 refills | Status: AC
  Filled 2024-09-11: qty 21, 7d supply, fill #0

## 2024-09-11 MED ORDER — VALACYCLOVIR HCL 500 MG PO TABS
1000.0000 mg | ORAL_TABLET | Freq: Once | ORAL | Status: AC
  Administered 2024-09-11: 1000 mg via ORAL
  Filled 2024-09-11: qty 2

## 2024-09-11 NOTE — Discharge Instructions (Addendum)
 Please make sure that you are eating a diet that is thicken liquid.  Please avoid just pure water since it could cause you to aspirate.  You can mix your medications into applesauce or yogurt to take your medications.  It is possible that your vocal cord paralysis could be due to herpes zoster, I prescribed you a course of prednisone  and valacyclovir.  Please take as prescribed.  Please ensure to follow-up with the ear nose and throat doctor for further management of your vocal cord paralysis as well as to get a thyroid  ultrasound done outpatient.   Your MRI was negative for stroke.  CT soft tissue neck: IMPRESSION:  1. Ill-defined lesion at the left true vocal cord measuring 5 x 5 x 8 mm with  medial deviation of the cord; endoscopic evaluation is recommended.  2. Multinodular thyroid  goiter with a left superior thyroid  nodule measuring  2.2 cm; recommend non-emergent thyroid  ultrasound as per ACR guidelines.  3. 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  4. 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

## 2024-09-11 NOTE — ED Triage Notes (Signed)
 Pt reports being choked up on water and pills since yesterday. Denies sore throat. Pt started on abx for ear infection since Thursday.

## 2024-09-11 NOTE — ED Provider Notes (Addendum)
 [facility] Provider Note    Event Date/Time   First MD Initiated Contact with Patient 09/11/24 1530     (approximate)   History   Dysphagia   HPI  Jasmine Webster is a 89 y.o. female with history of diabetes, chronic back pain, hypertension, hyperlipidemia, presenting with dysphagia.  Patient reported choking on water and pills yesterday.  Is on cefdinir  for a left ear infection.  States that she feels little bit congested.  Has noted that her voice was hoarse several days ago.  No new rash.  No weakness or numbness, no history of stroke, no vision changes.  No recent trauma or fall.  States that she feels a slight tightness in her throat when she swallows.  No prior history of dysphagia.  States that she was able to drink Coke, eat a biscuit today, no issues with dysphagia then.    On independent chart review, was seen by primary care in June, has history of hypertension, GERD, diabetes, CKD, also with history of heart failure on diuretic.  Her hypertension is chronic and well-controlled.  Had an MRI brain in 2018 that was normal.  Physical Exam   Triage Vital Signs: ED Triage Vitals  Encounter Vitals Group     BP 09/11/24 1342 (!) 150/42     Girls Systolic BP Percentile --      Girls Diastolic BP Percentile --      Boys Systolic BP Percentile --      Boys Diastolic BP Percentile --      Pulse Rate 09/11/24 1342 (!) 58     Resp 09/11/24 1343 16     Temp 09/11/24 1343 98.2 F (36.8 C)     Temp Source 09/11/24 1343 Oral     SpO2 09/11/24 1342 100 %     Weight 09/11/24 1343 149 lb 14.6 oz (68 kg)     Height 09/11/24 1343 5' 4 (1.626 m)     Head Circumference --      Peak Flow --      Pain Score 09/11/24 1342 0     Pain Loc --      Pain Education --      Exclude from Growth Chart --     Most recent vital signs: Vitals:   09/11/24 2148 09/11/24 2155  BP:    Pulse: (!) 59   Resp:    Temp:  98 F (36.7 C)  SpO2: 100%       General: Awake, no distress.  CV:  Good peripheral perfusion.  Resp:  Normal effort.  Abd:  No distention.  Other:  No trismus, she is maintaining her secretions, clear oropharynx, no focal weakness or numbness, no cranial nerve deficits.  Clear TMs bilaterally, pupils are equal and reactive, extraocular movements are intact.   ED Results / Procedures / Treatments   Labs (all labs ordered are listed, but only abnormal results are displayed) Labs Reviewed  CBC WITH DIFFERENTIAL/PLATELET - Abnormal; Notable for the following components:      Result Value   RBC 3.75 (*)    Hemoglobin 11.8 (*)    All other components within normal limits  BASIC METABOLIC PANEL WITH GFR - Abnormal; Notable for the following components:   Glucose, Bld 202 (*)    BUN 25 (*)    Creatinine, Ser 1.05 (*)    Calcium  8.5 (*)    GFR, Estimated 49 (*)    All other components within normal limits  RESP PANEL BY RT-PCR (RSV, FLU A&B, COVID)  RVPGX2  TSH  T4, FREE     RADIOLOGY On my independent interpretation, MRI without obvious stroke   PROCEDURES:  Critical Care performed: No  Procedures   MEDICATIONS ORDERED IN ED: Medications  iohexol  (OMNIPAQUE ) 300 MG/ML solution 75 mL (75 mLs Intravenous Contrast Given 09/11/24 1807)  predniSONE  (DELTASONE ) tablet 60 mg (60 mg Oral Given 09/11/24 2109)  valACYclovir (VALTREX) tablet 1,000 mg (1,000 mg Oral Given 09/11/24 2109)     IMPRESSION / MDM / ASSESSMENT AND PLAN / ED COURSE  I reviewed the triage vital signs and the nursing notes.                              Differential diagnosis includes, but is not limited to, pharyngitis, COVID, viral illness, considered but doubt epiglottitis, she is maintaining her secretions, has been ongoing for several days, no fever, did also consider subtle CVA, stroke alert was not activated since this dysphagia started yesterday.  Will get labs, respiratory viral panel, MRI brain, CT soft tissue  neck.  Patient's presentation is most consistent with acute presentation with potential threat to life or bodily function.  Bedside swallow screen with water was done, she did choke when she tried to swallow the water.  Independent interpretation of labs and imaging below.  Clinical course as below.  On reassessment patient is well-appearing, tolerating secretions, not hypoxic.  Discussed with her and daughter about thickened liquid diet.  And to mix of medications with yogurt or applesauce before taking them.  Gave her number to call for ENT to follow-up for her vocal cord paralysis.  Sent prednisone  and valacyclovir to her pharmacy.  First dose given in the emergency department.  Also instructed them to follow-up with primary care for further management of the incidental findings on CT.  Otherwise considered but no indication for inpatient admission at this time, she safe for outpatient management.  Will discharge with strict return precautions.  Shared decision making done with patient and daughter and they are agreeable with this plan.  Discharge.    Clinical Course as of 09/11/24 7665  Sat Sep 11, 2024  1648 Independent review of labs, no leukocytosis, electrolytes not severely deranged. [TT]  1757 Resp panel by RT-PCR (RSV, Flu A&B, Covid) Anterior Nasal Swab Neg  [TT]  1840 CT Soft Tissue Neck W Contrast IMPRESSION: 1. Ill-defined lesion at the left true vocal cord measuring 5 x 5 x 8 mm with medial deviation of the cord; endoscopic evaluation is recommended. 2. Multinodular thyroid  goiter with a left superior thyroid  nodule measuring 2.2 cm; recommend non-emergent thyroid  ultrasound as per ACR guidelines. 3. Moderate stenosis of the proximal left subclavian artery associated with atherosclerosis. 4. 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   [TT]  1847 Spoke to ENT, likely outpatient follow-up [TT]  1903 MR BRAIN WO CONTRAST 1. No acute intracranial abnormality.  [TT]   2016 TSH and free T4 are normal. [TT]  2027 ENT was able to evaluate the patient, states that she does not have a lesion on her vocal cord, she does have vocal cord paralysis on the right, given that she had complained about some facial aching without any overlying lesions or vesicles, could be an atypical presentation for zoster.  Recommended starting steroids and antivirals.  Was also concerned that the thyroid  could be the cause of her symptoms as well.  He will follow-up  with them and get an outpatient ultrasound ordered.  States that she is able to be discharged home with outpatient follow-up. [TT]    Clinical Course User Index [TT] Waymond Lorelle Cummins, MD     FINAL CLINICAL IMPRESSION(S) / ED DIAGNOSES   Final diagnoses:  Thyroid  nodule  Thyroid  goiter  Thyroglossal duct cyst  Vocal cord paralysis     Rx / DC Orders   ED Discharge Orders          Ordered    predniSONE  (DELTASONE ) 20 MG tablet  Daily with breakfast        09/11/24 2031    valACYclovir (VALTREX) 1000 MG tablet  3 times daily        09/11/24 2031             Note:  This document was prepared using Dragon voice recognition software and may include unintentional dictation errors.    Waymond Lorelle Cummins, MD 09/11/24 2020    Waymond Lorelle Cummins, MD 09/11/24 [phone]

## 2024-09-11 NOTE — ED Notes (Addendum)
 Introduced myself to the patient, took her vitals and assisted her to the rest room standby assist and then back to bed. Falls bundle applied with pt having her own shoes, no other needs at this time

## 2024-09-11 NOTE — ED Notes (Signed)
 Pt ambulated to bedside commode with steady gate x1 assist .Returned to bed without incident.

## 2024-09-11 NOTE — Consult Note (Signed)
 Jasmine Webster, Jasmine Webster [id number] [birth year] Jasmine GORMAN [name], MD  Reason for Consult: Hoarseness, possible vocal cord mass, dysphagia Requesting Physician: Jasmine Lorelle Cummins, MD Consulting Physician: Jasmine Webster  HPI: This 89 y.o. year old female was admitted on 09/11/2024 for sore throat. She has a history of diabetes, chronic back pain, hypertension, hyperlipidemia, presenting with dysphagia.  Patient reported choking on water and pills yesterday.  Is on cefdinir  for a left ear infection.  States that she feels little bit congested.  Has noted that her voice was hoarse since last Sunday.  No new rash or fever.  No weakness or numbness, no history of stroke, no vision changes.  No recent trauma or fall.  States that she feels a slight tightness in her throat when she swallows.  She also reports some discomfort in the right side of the jaw/facial area that has been fairly vague.  No prior history of dysphagia.  States that she was able to drink Coke, eat a biscuit today, no issues with dysphagia then.   On independent chart review, was seen by primary care in June, has history of hypertension, GERD, diabetes, CKD, also with history of heart failure on diuretic.  Her hypertension is chronic and well-controlled.  Had an MRI brain in 2018 that was normal.  Allergies:  Allergies  Allergen Reactions   Etodolac Nausea Only, Other (See Comments) and Anxiety    Dizziness, too   Naproxen Other (See Comments) and Anxiety    Gave her an ulcer after taking it for years   Statins Other (See Comments)    No energy and myalgias   Latex Rash   Terbinafine Other (See Comments) and Rash    Maybe made me nervous   Terbinafine Hcl Rash    Medications: (Not in a hospital admission) .  Current Facility-Administered Medications  Medication Dose Route Frequency Provider Last Rate Last Admin   predniSONE  (DELTASONE ) tablet 60 mg  60 mg Oral Once Tan, Lorelle Cummins, MD       valACYclovir (VALTREX) tablet 1,000 mg  1,000 mg  Oral Once Jasmine Webster, [facility]       Current Outpatient Medications  Medication Sig Dispense Refill   predniSONE  (DELTASONE ) 20 MG tablet Take 3 tablets (60 mg total) by mouth daily with breakfast for 7 days. 21 tablet 0   valACYclovir (VALTREX) 1000 MG tablet Take 1 tablet (1,000 mg total) by mouth 3 (three) times daily for 7 days. 21 tablet 0   Artificial Tear Ointment (DRY EYES OP) Apply 1 drop to eye daily.     ascorbic acid  (VITAMIN C ) 250 MG tablet Take 1 tablet (250 mg total) by mouth daily. 30 tablet 1   cetirizine  (ZYRTEC ) 10 MG tablet Take 1 tablet (10 mg total) by mouth daily. 30 tablet 11   ciclopirox (PENLAC) 8 % solution Apply topically at bedtime. Apply over nail and surrounding skin. Apply daily over previous coat. After seven (7) days, may remove with alcohol and continue cycle. 6.6 mL 0   co-enzyme Q-10 30 MG capsule Take 30 mg by mouth daily.     cyanocobalamin  (VITAMIN B12) 500 MCG tablet Take 1 tablet (500 mcg total) by mouth once a week. 30 tablet 1   ezetimibe  (ZETIA ) 10 MG tablet TAKE 1 TABLET BY MOUTH EVERY DAY 90 tablet 3   fluticasone  (FLONASE ) 50 MCG/ACT nasal spray Place 2 sprays into both nostrils daily. (Patient not taking: Reported on 07/12/2024) 16 g 6   furosemide  (LASIX ) 20 MG  tablet TAKE 1 TABLET BY MOUTH EVERY OTHER DAY (Patient not taking: Reported on 07/12/2024) 45 tablet 3   furosemide  (LASIX ) 20 MG tablet Take 1 tablet (20 mg total) by mouth every other day. 45 tablet 3   glucose blood test strip CHECK SUGAR TWICE A DAY 200 each 3   insulin  glargine (LANTUS ) 100 UNIT/ML Solostar Pen Inject 18 Units into the skin. 15 mL 2   insulin  glargine-yfgn (SEMGLEE , YFGN,) 100 UNIT/ML Pen INJECT 18 UNITS SUBCUTANEOUSLY DAILY AS DIRECTED 9 mL 2   Insulin  Pen Needle (TECHLITE PEN NEEDLES) 31G X 8 MM MISC CHECK SUGAR 2 TIMES DAILY 100 each 12   iron  polysaccharides (POLY-IRON  150) 150 MG capsule Take 1 capsule (150 mg total) by mouth every other day. 45 capsule 1    lisinopril  (ZESTRIL ) 10 MG tablet Take 1 tablet (10 mg total) by mouth daily. 90 tablet 1   Magnesium  400 MG CAPS Take 400 mg by mouth daily.     Multiple Vitamins-Minerals (PRESERVISION AREDS) CAPS Take 1 capsule by mouth 2 (two) times daily.      nitroGLYCERIN  (NITROSTAT ) 0.4 MG SL tablet Place 1 tablet (0.4 mg total) under the tongue every 5 (five) minutes as needed for chest pain. (Patient not taking: Reported on 07/12/2024) 25 tablet 3   Omega-3 Fatty Acids (FISH OIL PO) Take by mouth daily.     omeprazole  (PRILOSEC) 40 MG capsule Take 1 capsule (40 mg total) by mouth daily. 90 capsule 3   ondansetron  (ZOFRAN -ODT) 4 MG disintegrating tablet Dissolve 1 tablet (4 mg total) by mouth every 8 (eight) hours as needed for nausea or vomiting. 20 tablet 0   ONETOUCH ULTRA test strip CHECK SUGAR TWICE A DAY 200 strip 3   OneTouch UltraSoft 2 Lancets MISC USE AS INSTRUCTED 100 each 12   potassium chloride  (KLOR-CON ) 10 MEQ tablet Take 1 tablet (10 mEq total) by mouth daily as needed. Take with furosemide . 90 tablet 1   silver  sulfADIAZINE  (SILVADENE ) 1 % cream APPLY 1 APPLICATION TOPICALLY DAILY 50 g 0   Turmeric 500 MG CAPS Take by mouth daily.     Facility-Administered Medications Ordered in Other Encounters  Medication Dose Route Frequency Provider Last Rate Last Admin   0.9 %  sodium chloride  infusion   Intravenous Continuous Jasmine Call, MD 20 mL/hr at 12/11/22 1458 New Bag at 12/11/22 1458    PMH:  Past Medical History:  Diagnosis Date   Arthritis    Cataract    cataract removal bilaterally approx 20 years ago   Chronic back pain    COVID-19 virus infection 12/2020   Diabetes mellitus without complication (HCC)    GI bleed    Hyperlipidemia    Hypertension    Iron  deficiency anemia    Non-obstructive CAD (coronary artery disease)    a. 04/2022 Cath: LM 10ost, LAD 30p, RI 60, LCX min irregs, LPAV min irregs, RCA min irregs.   Severe aortic stenosis    a. 05/2022 Echo: EF 55-60%, no rwma,  mild LVH, GrII DD, nl RV fxn, mild MR, mild-mod MS, Sev AS (mean grad 33, peak grad 62, AVA 0.6cm^2).    Fam Hx:  Family History  Problem Relation Age of Onset   Heart disease Mother        died from MI   Heart attack Mother    Heart disease Sister    Sudden death Brother        shot and beaten to death during home invasion  Stroke Sister    Sudden death Sister        MVA   Cancer Brother        died from lung cancer   Sudden death Sister        69 days old   Cancer Brother        died from lung cancer   Stroke Brother        cause of death   Diabetes Father    Stroke Father    Pneumonia Father     Soc Hx:  Social History   Socioeconomic History   Marital status: Widowed    Spouse name: Not on file   Number of children: 2   Years of education: Not on file   Highest education level: 8th grade  Occupational History   Occupation: retired  Tobacco Use   Smoking status: Never   Smokeless tobacco: Never  Vaping Use   Vaping status: Never Used  Substance and Sexual Activity   Alcohol use: No    Alcohol/week: 0.0 standard drinks of alcohol   Drug use: No   Sexual activity: Not Currently  Other Topics Concern   Not on file  Social History Narrative   Lives with daughter, Darice Seats   Social Drivers of Health   Financial Resource Strain: Low Risk  (01/12/2024)   Overall Financial Resource Strain (CARDIA)    Difficulty of Paying Living Expenses: Not hard at all  Food Insecurity: No Food Insecurity (01/12/2024)   Hunger Vital Sign    Worried About Running Out of Food in the Last Year: Never true    Ran Out of Food in the Last Year: Never true  Transportation Needs: No Transportation Needs (01/12/2024)   PRAPARE - Administrator, Civil Service (Medical): No    Lack of Transportation (Non-Medical): No  Physical Activity: Inactive (01/12/2024)   Exercise Vital Sign    Days of Exercise per Week: 0 days    Minutes of Exercise per Session: 0 min  Stress:  No Stress Concern Present (01/12/2024)   [employer] of Occupational Health - Occupational Stress Questionnaire    Feeling of Stress : Not at all  Social Connections: Moderately Isolated (01/12/2024)   Social Connection and Isolation Panel    Frequency of Communication with Friends and Family: More than three times a week    Frequency of Social Gatherings with Friends and Family: More than three times a week    Attends Religious Services: More than 4 times per year    Active Member of [employer] or Organizations: No    Attends Banker Meetings: Never    Marital Status: Never married  Intimate Partner Violence: Not At Risk (01/12/2024)   Humiliation, Afraid, Rape, and Kick questionnaire    Fear of Current or Ex-Partner: No    Emotionally Abused: No    Physically Abused: No    Sexually Abused: No    PSH:  Past Surgical History:  Procedure Laterality Date   ABDOMINAL HYSTERECTOMY     APPENDECTOMY     BACK SURGERY     three   CARDIAC CATHETERIZATION     6+yrs no stents Dr. Morgan Morita   CATARACT EXTRACTION     RIGHT/LEFT HEART CATH AND CORONARY ANGIOGRAPHY N/A 04/26/2022   Procedure: RIGHT/LEFT HEART CATH AND CORONARY ANGIOGRAPHY;  Surgeon: Darron Deatrice LABOR, MD;  Location: [facility] INVASIVE CV LAB;  Service: Cardiovascular;  Laterality: N/A;   THORACOLUMBAR SYRINGO SHUNT  TONSILLECTOMY AND ADENOIDECTOMY    . Procedures since admission: No admission procedures for hospital encounter.  ROS: Review of systems normal other than 12 systems except per HPI.  PHYSICAL EXAM Vitals:  Vitals:   09/11/24 1533 09/11/24 1914  BP: (!) 151/69 (!) 169/51  Pulse: 61 (!) 59  Resp: 18 18  Temp: (!) 97.4 F (36.3 C) 98 F (36.7 C)  SpO2: 96% 100%  . General: Well-developed, Well-nourished in no acute distress Mood: Mood and affect well adjusted, pleasant and cooperative. Orientation: Grossly alert and oriented. Vocal Quality: Voice is hoarse and breathy. Communicates  verbally. head and Face: NCAT. No facial asymmetry. No visible skin lesions. No significant facial scars. No tenderness with sinus percussion. Facial strength normal and symmetric. Ears: External ears with normal landmarks, no lesions. External auditory canals free of infection, cerumen impaction or lesions. Tympanic membranes intact with good landmarks and normal mobility on pneumatic otoscopy. No middle ear effusion. Hearing: Speech reception grossly normal. Nose: External nose normal with midline dorsum and no lesions or deformity. Nasal Cavity reveals essentially midline septum with normal inferior turbinates. No significant mucosal congestion or erythema. Nasal secretions are minimal and clear. No polyps seen on anterior rhinoscopy. Oral Cavity/ Oropharynx: Lips are normal with no lesions.  Patient has upper and lower dentures. Gingiva healthy with no lesions or gingivitis. Oropharynx including tongue, buccal mucosa, floor of mouth, hard and soft palate, uvula and posterior pharynx free of exudates, erythema or lesions with normal symmetry and hydration.  Indirect Laryngoscopy/Nasopharyngoscopy: Visualization of the larynx, hypopharynx and nasopharynx is not possible in this setting with routine examination. Neck: Supple and symmetric with no palpable masses, tenderness or crepitance. The trachea is midline. Thyroid  gland feels enlarged, more on the right than the left. Parotid and submandibular glands are soft, nontender and symmetric, without masses. Lymphatic: Cervical lymph nodes are without palpable lymphadenopathy or tenderness. Respiratory: Normal respiratory effort without labored breathing. Cardiovascular: Carotid pulse shows regular rate and rhythm Neurologic: Cranial Nerves II through XII are grossly intact with the exception of findings related to the recurrent laryngeal nerve on the scope exam Eyes: Gaze and Ocular Motility are grossly normal. PERRLA. No visible nystagmus.  MEDICAL  DECISION MAKING: Data Review:  Results for orders placed or performed during the hospital encounter of 09/11/24 (from the past 48 hours)  CBC with Differential     Status: Abnormal   Collection Time: 09/11/24  1:50 PM  Result Value Ref Range   WBC 5.5 4.0 - 10.5 K/uL   RBC 3.75 (L) 3.87 - 5.11 MIL/uL   Hemoglobin 11.8 (L) 12.0 - 15.0 g/dL   HCT 63.7 63.9 - 53.9 %   MCV 96.5 80.0 - 100.0 fL   MCH 31.5 26.0 - 34.0 pg   MCHC 32.6 30.0 - 36.0 g/dL   RDW 87.3 88.4 - 84.4 %   Platelets 178 150 - 400 K/uL   nRBC 0.0 0.0 - 0.2 %   Neutrophils Relative % 65 %   Neutro Abs 3.6 1.7 - 7.7 K/uL   Lymphocytes Relative 24 %   Lymphs Abs 1.3 0.7 - 4.0 K/uL   Monocytes Relative 9 %   Monocytes Absolute 0.5 0.1 - 1.0 K/uL   Eosinophils Relative 1 %   Eosinophils Absolute 0.1 0.0 - 0.5 K/uL   Basophils Relative 1 %   Basophils Absolute 0.1 0.0 - 0.1 K/uL   Immature Granulocytes 0 %   Abs Immature Granulocytes 0.02 0.00 - 0.07 K/uL  Comment: Performed at [facility], [address] [place], [place] [address]  Basic metabolic panel     Status: Abnormal   Collection Time: 09/11/24  1:50 PM  Result Value Ref Range   Sodium 135 135 - 145 mmol/L   Potassium 4.5 3.5 - 5.1 mmol/L   Chloride 103 98 - 111 mmol/L   CO2 24 22 - 32 mmol/L   Glucose, Bld 202 (H) 70 - 99 mg/dL    Comment: Glucose reference range applies only to samples taken after fasting for at least 8 hours.   BUN 25 (H) 8 - 23 mg/dL   Creatinine, Ser 8.94 (H) 0.44 - 1.00 mg/dL   Calcium  8.5 (L) 8.9 - 10.3 mg/dL   GFR, Estimated 49 (L) >60 mL/min    Comment: (NOTE) Calculated using the CKD-EPI Creatinine Equation (2021)    Anion gap 8 5 - 15    Comment: Performed at [facility], [address] [place], [place] [address]  TSH     Status: None   Collection Time: 09/11/24  1:50 PM  Result Value Ref Range   TSH 3.050 0.350 - 4.500 uIU/mL    Comment: Performed by a 3rd Generation assay with a functional  sensitivity of <=0.01 uIU/mL. Performed at [facility], [address] [place], [place] [address]   T4, free     Status: None   Collection Time: 09/11/24  1:50 PM  Result Value Ref Range   Free T4 0.78 0.61 - 1.12 ng/dL    Comment: (NOTE) Biotin ingestion may interfere with free T4 tests. If the results are inconsistent with the TSH level, previous test results, or the clinical presentation, then consider biotin interference. If needed, order repeat testing after stopping biotin. Performed at [facility], [address] [place], [place] [address]   Resp panel by RT-PCR (RSV, Flu A&B, Covid) Anterior Nasal Swab     Status: None   Collection Time: 09/11/24  4:50 PM   Specimen: Anterior Nasal Swab  Result Value Ref Range   SARS Coronavirus 2 by RT PCR NEGATIVE NEGATIVE    Comment: (NOTE) SARS-CoV-2 target nucleic acids are NOT DETECTED.  The SARS-CoV-2 RNA is generally detectable in upper respiratory specimens during the acute phase of infection. The lowest concentration of SARS-CoV-2 viral copies this assay can detect is 138 copies/mL. A negative result does not preclude SARS-Cov-2 infection and should not be used as the sole basis for treatment or other patient management decisions. A negative result may occur with  improper specimen collection/handling, submission of specimen other than nasopharyngeal swab, presence of viral mutation(s) within the areas targeted by this assay, and inadequate number of viral copies(<138 copies/mL). A negative result must be combined with clinical observations, patient history, and epidemiological information. The expected result is Negative.  Fact Sheet for Patients:  bloggercourse.com  Fact Sheet for Healthcare Providers:  seriousbroker.it  This test is no t yet approved or cleared by the [place]  FDA and  has been authorized for detection and/or diagnosis of  SARS-CoV-2 by FDA under an Emergency Use Authorization (EUA). This EUA will remain  in effect (meaning this test can be used) for the duration of the COVID-19 declaration under Section 564(b)(1) of the Act, 21 U.S.C.section 360bbb-3(b)(1), unless the authorization is terminated  or revoked sooner.       Influenza A by PCR NEGATIVE NEGATIVE   Influenza B by PCR NEGATIVE NEGATIVE    Comment: (NOTE) The Xpert Xpress SARS-CoV-2/FLU/RSV  plus assay is intended as an aid in the diagnosis of influenza from Nasopharyngeal swab specimens and should not be used as a sole basis for treatment. Nasal washings and aspirates are unacceptable for Xpert Xpress SARS-CoV-2/FLU/RSV testing.  Fact Sheet for Patients: bloggercourse.com  Fact Sheet for Healthcare Providers: seriousbroker.it  This test is not yet approved or cleared by the [place]  FDA and has been authorized for detection and/or diagnosis of SARS-CoV-2 by FDA under an Emergency Use Authorization (EUA). This EUA will remain in effect (meaning this test can be used) for the duration of the COVID-19 declaration under Section 564(b)(1) of the Act, 21 U.S.C. section 360bbb-3(b)(1), unless the authorization is terminated or revoked.     Resp Syncytial Virus by PCR NEGATIVE NEGATIVE    Comment: (NOTE) Fact Sheet for Patients: bloggercourse.com  Fact Sheet for Healthcare Providers: seriousbroker.it  This test is not yet approved or cleared by the [place]  FDA and has been authorized for detection and/or diagnosis of SARS-CoV-2 by FDA under an Emergency Use Authorization (EUA). This EUA will remain in effect (meaning this test can be used) for the duration of the COVID-19 declaration under Section 564(b)(1) of the Act, 21 U.S.C. section 360bbb-3(b)(1), unless the authorization is terminated or revoked.  Performed at [facility], [address], [place], [place] [address]   . MR BRAIN WO CONTRAST Result Date: 09/11/2024 EXAM: MRI BRAIN WITHOUT CONTRAST 09/11/2024 06:50:27 PM TECHNIQUE: Multiplanar multisequence MRI of the head/brain was performed without the administration of intravenous contrast. COMPARISON: CT head without contrast 03/28/2020 and MRI head without contrast 04/18/2017. CLINICAL HISTORY: FINDINGS: BRAIN AND VENTRICLES: No acute infarct. No intracranial hemorrhage. No mass. No midline shift. No hydrocephalus. The sella is unremarkable. Normal flow voids. ORBITS: Bilateral lens replacements are noted. The globes and orbits are otherwise within normal limits. SINUSES AND MASTOIDS: No acute abnormality. BONES AND SOFT TISSUES: Normal marrow signal. No acute soft tissue abnormality. IMPRESSION: 1. No acute intracranial abnormality. Electronically signed by: Lonni Necessary MD 09/11/2024 07:00 PM EDT RP Workstation: [id number]   CT Soft Tissue Neck W Contrast Result Date: 09/11/2024 EXAM: CT NECK WITH CONTRAST 09/11/2024 06:19:35 PM TECHNIQUE: CT of the neck was performed with the administration of 75 mL of iohexol  (OMNIPAQUE ) 300 MG/ML solution. Multiplanar reformatted images are provided for review. Automated exposure control, iterative reconstruction, and/or weight based adjustment of the mA/kV was utilized to reduce the radiation dose to as low as reasonably achievable. COMPARISON: None available. CLINICAL HISTORY: Dysphagia to pills and water, she is hoarse, no sore throat, says she feels slight tightness. Pt reports being choked up on water and pills since yesterday. Denies sore throat. Pt started on abx for ear infection since Thursday. FINDINGS: AERODIGESTIVE TRACT: An ill-defined density is present at the level of the left true vocal cord deviating the cord medially. The lesion measures 5 x 5 x 8 mm. No other focal mucosal or submucosal lesions are present. A 9 mm benign-appearing hyperdense  lesion is present just anterior to the thyroid  cartilage, likely a thyroglossal duct cyst. SALIVARY GLANDS: The parotid and submandibular glands are unremarkable. THYROID : A multinodular thyroid  goiter is present. Left superior thyroid  nodule measures up to 2.2 cm. A more inferior calcified nodule in the left lobe of the thyroid  measures 12 mm. LYMPH NODES: No suspicious cervical lymphadenopathy. SOFT TISSUES: No mass or fluid collection. BRAIN, ORBITS, SINUSES AND MASTOIDS: Atherosclerotic calcifications are present within the cavernous internal carotid arteries bilaterally without definite stenosis. Minimal mucosal thickening  is present in the right sphenoid sinus. No acute abnormality. LUNGS AND MEDIASTINUM: Dense atherosclerotic calcifications are present at the aortic arch and great vessel origins. Moderate stenosis is present in the proximal left subclavian artery. No acute abnormality. BONES: Cervical fusion is present at C6-C7. Ankylosis is present at C4-C5. Straightening of the normal cervical lordosis is present. The patient is edentulous. No focal osseous lesions are present. Insert the lens replacements. IMPRESSION: 1. Ill-defined lesion at the left true vocal cord measuring 5 x 5 x 8 mm with medial deviation of the cord; endoscopic evaluation is recommended. 2. Multinodular thyroid  goiter with a left superior thyroid  nodule measuring 2.2 cm; recommend non-emergent thyroid  ultrasound as per ACR guidelines. 3. Moderate stenosis of the proximal left subclavian artery associated with atherosclerosis. 4. 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 Electronically signed by: Lonni Necessary MD 09/11/2024 06:35 PM EDT RP Workstation: [id number]  .   PROCEDURE: Procedure: Diagnostic Fiberoptic Nasolaryngoscopy Diagnosis: Dysphagia, hoarseness secondary to right true vocal cord paralysis Indications: Evaluate larynx for abnormality seen on CT, rule out mass Findings: The vocal cords are clear, free  of any lesions.  Specifically there is no growth, polyp or nodule on the left vocal cord which is freely mobile.  The right vocal cord is lateralized and paralyzed with bowing of the cord.  No lesions are seen. Description of Procedure: After discussing procedure and risks  (primarily nose bleed) with the patient, the nose was anesthetized with topical Lidocaine  4% and decongested with phenylephrine. A flexible fiberoptic scope was passed through the nasal cavity. The nasal cavity was inspected and the scope passed through the Nasopharynx to the region of the hypopharynx and larynx. The patient was instructed to phonate to assess vocal cord mobility. The tongue was extended to evaluate the tongue base completely. Valsalva was performed to insufflate the hypopharynx for improved examination. Findings are as noted above. The scope was withdrawn. The patient tolerated the procedure well.  ASSESSMENT: Patient with right vocal cord paralysis of uncertain etiology.  CT scan did show bilateral thyroid  nodules of uncertain duration.  No other causes for vocal cord paralysis are identified.  There is a small chance she could have a viral neuropathy as shingles can potentially paralyze the vocal cord and could explain some of the right sided pain she has experienced.  MRI shows no evidence of stroke.  PLAN: Patient appears able to swallow despite her dysphagia.  Discussed with the ER and they feel they can discharge her for outpatient evaluation.  We discussed a trial of valacyclovir and steroids while we work up the thyroid  nodules.  Explained to the family that thyroid  nodules would be the main focus of the evaluation to make sure there is not malignancy causing the right cord paralysis.  My office can arrange the thyroid  ultrasound as an outpatient Monday.  We will follow-up from there.   Jasmine GORMAN [name], MD 09/11/2024 8:43 PM

## 2024-09-12 ENCOUNTER — Other Ambulatory Visit (HOSPITAL_COMMUNITY): Payer: Self-pay

## 2024-09-12 ENCOUNTER — Encounter: Payer: Self-pay | Admitting: Podiatry

## 2024-09-12 NOTE — Progress Notes (Signed)
  Subjective:  Patient ID: Jasmine Webster, female    DOB: 02-07-1926,  MRN: [id number]  Jasmine Webster presents to clinic today for at risk foot care. Pt has h/o NIDDM with chronic kidney disease and painful mycotic toenails of both feet that are difficult to trim. Pain interferes with daily activities and wearing enclosed shoe gear comfortably.  Chief Complaint  Patient presents with   Toe Pain    Diabetic foot care. Dr. Sharma is her PCP.  She saw her in June. Alc is 6.8   New problem(s): None.   PCP is Simmons-Robinson, Financial Controller, MD.  Allergies  Allergen Reactions   Etodolac Nausea Only, Other (See Comments) and Anxiety    Dizziness, too   Naproxen Other (See Comments) and Anxiety    Gave her an ulcer after taking it for years   Statins Other (See Comments)    No energy and myalgias   Latex Rash   Terbinafine Other (See Comments) and Rash    Maybe made me nervous   Terbinafine Hcl Rash    Review of Systems: Negative except as noted in the HPI.  Objective: No changes noted in today's physical examination. There were no vitals filed for this visit. Jasmine Webster is a pleasant 89 y.o. female WD, WN in NAD. AAO x 3.  Vascular Examination: CFT <3 seconds b/l. DP/PT pulses faintly palpable b/l. Skin temperature gradient warm to warm b/l. No pain with calf compression. No ischemia or gangrene. No cyanosis or clubbing noted b/l. Pedal hair absent. No edema noted b/l LE.   Neurological Examination: Sensation grossly intact b/l with 10 gram monofilament. Vibratory sensation intact b/l.   Dermatological Examination: Pedal skin thin, shiny and atrophic b/l LE.  No open wounds b/l. No interdigital macerations b/l. Toenails 1-5 b/l elongated, thickened, discolored with subungual debris. +Tenderness with dorsal palpation of nailplates. No hyperkeratotic or porokeratotic lesions present.  Musculoskeletal Examination: Muscle strength 5/5 to all lower extremity muscle groups  bilaterally. No pain, crepitus or joint limitation noted with ROM b/l LE. HAV with bunion deformity noted b/l LE. Hammertoe(s) bilateral 2nd toes.. Patient ambulates with cane assistance.  Radiographs: None  Assessment/Plan: 1. Pain due to onychomycosis of toenails of both feet   2. Type 2 diabetes mellitus with stage 4 chronic kidney disease, with long-term current use of insulin  [facility])   Consent given for treatment. Patient examined. All patient's and/or POA's questions/concerns addressed on today's visit. Mycotic toenails 1-5 b/l debrided in length and girth without incident. Continue foot and shoe inspections daily. Monitor blood glucose per PCP/Endocrinologist's recommendations.Continue soft, supportive shoe gear daily. Report any pedal injuries to medical professional. Call office if there are any quesitons/concerns. -Patient/POA to call should there be question/concern in the interim.   Return in about 3 months (around 12/07/2024).  Jasmine Webster, DPM      [place] LOCATION: [address], [place] [address]                   Office [phone]   [facility] LOCATION: [address] [place], [place] [address] Office [phone]

## 2024-09-13 ENCOUNTER — Other Ambulatory Visit (HOSPITAL_COMMUNITY): Payer: Self-pay

## 2024-09-13 ENCOUNTER — Other Ambulatory Visit: Payer: Self-pay | Admitting: Otolaryngology

## 2024-09-13 ENCOUNTER — Encounter: Payer: Self-pay | Admitting: Pharmacist

## 2024-09-13 ENCOUNTER — Other Ambulatory Visit: Payer: Self-pay

## 2024-09-13 DIAGNOSIS — E041 Nontoxic single thyroid nodule: Secondary | ICD-10-CM

## 2024-09-16 ENCOUNTER — Other Ambulatory Visit (HOSPITAL_COMMUNITY): Payer: Self-pay

## 2024-09-16 ENCOUNTER — Other Ambulatory Visit: Payer: Self-pay

## 2024-09-16 ENCOUNTER — Ambulatory Visit
Admission: RE | Admit: 2024-09-16 | Discharge: 2024-09-16 | Disposition: A | Discharge status: Home / Self Care | Source: Ambulatory Visit | Attending: Otolaryngology | Admitting: Otolaryngology

## 2024-09-16 DIAGNOSIS — E041 Nontoxic single thyroid nodule: Secondary | ICD-10-CM

## 2024-09-16 MED FILL — Potassium Chloride Tab ER 10 mEq: ORAL | 90 days supply | Qty: 90 | Fill #0 | Status: AC

## 2024-09-17 ENCOUNTER — Encounter: Payer: Self-pay | Admitting: Oncology

## 2024-09-22 ENCOUNTER — Other Ambulatory Visit: Payer: Self-pay

## 2024-09-22 ENCOUNTER — Emergency Department
Admission: EM | Admit: 2024-09-22 | Discharge: 2024-09-22 | Disposition: A | Discharge status: Home / Self Care | Source: Other Acute Inpatient Hospital | Attending: Emergency Medicine | Admitting: Emergency Medicine

## 2024-09-22 ENCOUNTER — Emergency Department

## 2024-09-22 DIAGNOSIS — R6884 Jaw pain: Secondary | ICD-10-CM | POA: Insufficient documentation

## 2024-09-22 DIAGNOSIS — R0602 Shortness of breath: Secondary | ICD-10-CM | POA: Insufficient documentation

## 2024-09-22 DIAGNOSIS — R531 Weakness: Secondary | ICD-10-CM | POA: Diagnosis not present

## 2024-09-22 LAB — COMPREHENSIVE METABOLIC PANEL WITH GFR
ALT: 52 U/L — ABNORMAL HIGH (ref 0–44)
AST: 37 U/L (ref 15–41)
Albumin: 3.6 g/dL (ref 3.5–5.0)
Alkaline Phosphatase: 90 U/L (ref 38–126)
Anion gap: 8 (ref 5–15)
BUN: 26 mg/dL — ABNORMAL HIGH (ref 8–23)
CO2: 22 mmol/L (ref 22–32)
Calcium: 8.2 mg/dL — ABNORMAL LOW (ref 8.9–10.3)
Chloride: 108 mmol/L (ref 98–111)
Creatinine, Ser: 1.12 mg/dL — ABNORMAL HIGH (ref 0.44–1.00)
GFR, Estimated: 45 mL/min — ABNORMAL LOW (ref >60)
Glucose, Bld: 145 mg/dL — ABNORMAL HIGH (ref 70–99)
Potassium: 3.9 mmol/L (ref 3.5–5.1)
Sodium: 138 mmol/L (ref 135–145)
Total Bilirubin: 0.8 mg/dL (ref 0.0–1.2)
Total Protein: 6.6 g/dL (ref 6.5–8.1)

## 2024-09-22 LAB — CBC WITH DIFFERENTIAL/PLATELET
Abs Immature Granulocytes: 0.06 K/uL (ref 0.00–0.07)
Basophils Absolute: 0 K/uL (ref 0.0–0.1)
Basophils Relative: 0 %
Eosinophils Absolute: 0 K/uL (ref 0.0–0.5)
Eosinophils Relative: 0 %
HCT: 42.5 % (ref 36.0–46.0)
Hemoglobin: 13.5 g/dL (ref 12.0–15.0)
Immature Granulocytes: 1 %
Lymphocytes Relative: 21 %
Lymphs Abs: 2.2 K/uL (ref 0.7–4.0)
MCH: 31.6 pg (ref 26.0–34.0)
MCHC: 31.8 g/dL (ref 30.0–36.0)
MCV: 99.5 fL (ref 80.0–100.0)
Monocytes Absolute: 1.1 K/uL — ABNORMAL HIGH (ref 0.1–1.0)
Monocytes Relative: 11 %
Neutro Abs: 7.1 K/uL (ref 1.7–7.7)
Neutrophils Relative %: 67 %
Platelets: UNDETERMINED K/uL (ref 150–400)
RBC: 4.27 MIL/uL (ref 3.87–5.11)
RDW: 13.6 % (ref 11.5–15.5)
WBC: 10.5 K/uL (ref 4.0–10.5)
nRBC: 0 % (ref 0.0–0.2)

## 2024-09-22 LAB — TROPONIN I (HIGH SENSITIVITY)
Troponin I (High Sensitivity): 51 ng/L — ABNORMAL HIGH (ref <18)
Troponin I (High Sensitivity): 46 ng/L — ABNORMAL HIGH (ref <18)

## 2024-09-22 MED ORDER — SODIUM CHLORIDE 0.9 % IV BOLUS
500.0000 mL | Freq: Once | INTRAVENOUS | Status: AC
  Administered 2024-09-22: 500 mL via INTRAVENOUS

## 2024-09-22 MED ORDER — HYDROCODONE-ACETAMINOPHEN 5-325 MG PO TABS: 0.5000 | ORAL_TABLET | Freq: Four times a day (QID) | ORAL | 0 refills | Status: AC | PRN

## 2024-09-22 NOTE — Discharge Instructions (Addendum)
 You are seen in the emergency department today for evaluation of your weakness.  Your testing fortunately did not show an emergency cause for this.  I suspect this may be partly related to dehydration, please make sure you are eating and drinking regularly.  Please reschedule your appointment with ENT for further evaluation.  I have sent a prescription for a narcotic pain medicine for severe breathrough pain of your jaw. Do not drink alcohol, drive or participate in any other potentially dangerous activities while taking this medication as it may make you sleepy. Do not take this medication with any other sedating medications, either prescription or over-the-counter.  This medication is intended for your use only - do not give any to anyone else and keep it in a secure place where nobody else, especially children, have access to it.  It can also cause or worsen constipation, so you may want to consider taking an over-the-counter stool softener while you are taking this medication.

## 2024-09-22 NOTE — ED Triage Notes (Signed)
 Pt arrives via [facility] from home where they live with their daughter with c/o weakness that started this morning, SOB and tired that has been going on for a week. Pt also reports an issue with swallowing that's been going on for a week and was to see an ENT this morning but was too weak to go to the appt. Pt is A&Ox4 during triage.

## 2024-09-22 NOTE — ED Provider Notes (Signed)
 [facility] Provider Note    Event Date/Time   First MD Initiated Contact with Patient 09/22/24 [phone]     (approximate)   History   Weakness and Shortness of Breath   HPI  Jasmine Webster is a 89 year old female presenting to the emergency department for evaluation of weakness.  Patient was post to have a follow-up appointment with her ENT today, but felt too weak in the morning to attend the appointment.  Reports that she has had some progressive weakness over the past several weeks, but worse this morning.  No fall or head strike.  Denies chest pain, abdominal pain, vomiting, diarrhea, dysuria.  Reports mild shortness of breath, not new for patient.  Daughter does note that patient accidentally took a couple doses extra of her Lasix  a few days ago.  Daughter also notes that patient has had about a year of right sided facial pain that is worse at night.  Reviewed ER visit from 09/11/2024.  At that time, patient was seen with dysphagia.  Workup included CT of the neck which demonstrated a questionable vocal cord lesion that was later thought to be related to vocal cord paralysis after ENT evaluation.  MRI without acute findings.  Thyroid  nodule noted for which patient had outpatient ultrasound, scheduled to have follow-up with ENT today.     Physical Exam   Triage Vital Signs: ED Triage Vitals  Encounter Vitals Group     BP 09/22/24 1000 (!) 185/61     Girls Systolic BP Percentile --      Girls Diastolic BP Percentile --      Boys Systolic BP Percentile --      Boys Diastolic BP Percentile --      Pulse Rate 09/22/24 1000 72     Resp 09/22/24 1000 17     Temp 09/22/24 1000 (!) 97.1 F (36.2 C)     Temp Source 09/22/24 1000 Oral     SpO2 09/22/24 0959 99 %     Weight 09/22/24 1002 144 lb (65.3 kg)     Height 09/22/24 1002 5' 4 (1.626 m)     Head Circumference --      Peak Flow --      Pain Score 09/22/24 1001 0     Pain Loc --      Pain Education --       Exclude from Growth Chart --     Most recent vital signs: Vitals:   09/22/24 1400 09/22/24 1413  BP: (!) 135/49   Pulse: 61   Resp: 14   Temp:  97.7 F (36.5 C)  SpO2: 100%      General: Awake, interactive  Head:  No temporal tenderness CV:  Good peripheral perfusion Resp:  Unlabored respirations, lungs clear to auscultation Abd:  Nondistended,   Soft, nontender Neuro:  Symmetric facial movement, fluid speech, 5/5 strength in bilateral upper and lower extremities   ED Results / Procedures / Treatments   Labs (all labs ordered are listed, but only abnormal results are displayed) Labs Reviewed  CBC WITH DIFFERENTIAL/PLATELET - Abnormal; Notable for the following components:      Result Value   Monocytes Absolute 1.1 (*)    All other components within normal limits  COMPREHENSIVE METABOLIC PANEL WITH GFR - Abnormal; Notable for the following components:   Glucose, Bld 145 (*)    BUN 26 (*)    Creatinine, Ser 1.12 (*)    Calcium  8.2 (*)  ALT 52 (*)    GFR, Estimated 45 (*)    All other components within normal limits  TROPONIN I (HIGH SENSITIVITY) - Abnormal; Notable for the following components:   Troponin I (High Sensitivity) 51 (*)    All other components within normal limits  TROPONIN I (HIGH SENSITIVITY) - Abnormal; Notable for the following components:   Troponin I (High Sensitivity) 46 (*)    All other components within normal limits     EKG EKG independently reviewed and interpreted by myself demonstrates:  EKG demonstrate sinus rhythm at a rate of 71, PR 160, QRS 139, QTc 480, left bundle branch block noted, not new, no appreciable superimposed ischemia  RADIOLOGY Imaging independently reviewed and interpreted by myself demonstrates:  CXR without focal consolidation  Formal Radiology Read:  DG Chest Portable 1 View Result Date: 09/22/2024 EXAM: 1 VIEW(S) XRAY OF THE CHEST 09/22/2024 10:31:15 AM COMPARISON: None available. CLINICAL HISTORY:  weakness FINDINGS: LUNGS AND PLEURA: No focal pulmonary opacity. No pulmonary edema. No pleural effusion. No pneumothorax. HEART AND MEDIASTINUM: Aortic atherosclerosis. No acute abnormality of the cardiac and mediastinal silhouettes. BONES AND SOFT TISSUES: Cervical ACDF noted. Intact partially imaged thoracolumbar spinal fixation hardware. IMPRESSION: 1. No acute cardiopulmonary process. Electronically signed by: Helayne Hurst MD 09/22/2024 10:45 AM EST RP Workstation: [id number]    PROCEDURES:  Critical Care performed: No  Procedures   MEDICATIONS ORDERED IN ED: Medications  sodium chloride  0.9 % bolus 500 mL (0 mLs Intravenous Stopped 09/22/24 1204)     IMPRESSION / MDM / ASSESSMENT AND PLAN / ED COURSE  I reviewed the triage vital signs and the nursing notes.  Differential diagnosis includes, but is not limited to, anemia, electrolyte abnormality, dehydration, ACS, no reported urinary symptoms  Patient's presentation is most consistent with acute presentation with potential threat to life or bodily function.  89 year old female presenting to the emergency department for evaluation of subacute weakness.  Recent negative MRI, awaiting outpatient ENT evaluation.  Labs here today with reassuring CBC, CMP with stable mild renal impairment.  Troponin elevated at 51, repeat stable at 46, patient without chest pain.  Very low suspicion ACS, does appear that she has had an elevated troponin in the past though this is slightly higher.  Patient reassessed after receiving IV fluids here.  Did feel improved and was able to ambulate with a walker down the hall.  Reviewed results of workup with patient and family.  Symptoms are largely subacute, do think patient can be stably discharged home.  Family is comfortable with this plan.  They did express concerns about an adequate pain control related to her jaw.  They said this pain has been ongoing for a year, but they have not been given any medications to  help with the pain.  With her recent imaging, do not think repeat imaging is indicated.  We discussed that with her age certainly all medications have significant risk.  I do think this is better evaluated by ENT, but we did discuss trial of a short course of narcotic pain medicine.  Both patient and family understand risks of this including oversedation, falls, aspiration due to sedation.  Strict return precautions provided.  Patient charged in stable condition.    FINAL CLINICAL IMPRESSION(S) / ED DIAGNOSES   Final diagnoses:  Generalized weakness  Jaw pain     Rx / DC Orders   ED Discharge Orders          Ordered    HYDROcodone -acetaminophen  (NORCO/VICODIN) 5-325  MG tablet  Every 6 hours PRN        09/22/24 1525             Note:  This document was prepared using Dragon voice recognition software and may include unintentional dictation errors.   Levander Slate, MD 09/22/24 [phone]

## 2024-09-22 NOTE — ED Notes (Signed)
 Called CCMD to add pt to monitoring.

## 2024-09-22 NOTE — ED Notes (Signed)
 Pt was able to ambulate with walker down the hallway. Pt denies SOB or dizziness.

## 2024-09-25 ENCOUNTER — Other Ambulatory Visit (HOSPITAL_COMMUNITY): Payer: Self-pay

## 2024-09-25 MED ORDER — AZELASTINE HCL 0.1 % NA SOLN
1.0000 | Freq: Two times a day (BID) | NASAL | 12 refills | Status: DC
  Filled 2024-09-25: qty 30, 15d supply, fill #0
  Filled 2024-09-28: qty 30, 50d supply, fill #0

## 2024-09-27 ENCOUNTER — Encounter: Payer: Self-pay | Admitting: Family Medicine

## 2024-09-28 ENCOUNTER — Other Ambulatory Visit (HOSPITAL_COMMUNITY): Payer: Self-pay

## 2024-09-28 ENCOUNTER — Other Ambulatory Visit: Payer: Self-pay

## 2024-10-01 NOTE — Telephone Encounter (Signed)
 Appt scheduled for 10/06/2024 at 11am

## 2024-10-06 ENCOUNTER — Encounter: Payer: Self-pay | Admitting: Family Medicine

## 2024-10-06 ENCOUNTER — Other Ambulatory Visit: Payer: Self-pay

## 2024-10-06 ENCOUNTER — Other Ambulatory Visit (HOSPITAL_COMMUNITY): Payer: Self-pay

## 2024-10-06 ENCOUNTER — Ambulatory Visit (INDEPENDENT_AMBULATORY_CARE_PROVIDER_SITE_OTHER): Admitting: Family Medicine

## 2024-10-06 VITALS — BP 137/53 | HR 68 | Ht 64.0 in | Wt 144.0 lb

## 2024-10-06 DIAGNOSIS — F5101 Primary insomnia: Secondary | ICD-10-CM

## 2024-10-06 DIAGNOSIS — I10 Essential (primary) hypertension: Secondary | ICD-10-CM

## 2024-10-06 DIAGNOSIS — E039 Hypothyroidism, unspecified: Secondary | ICD-10-CM | POA: Diagnosis not present

## 2024-10-06 DIAGNOSIS — E782 Mixed hyperlipidemia: Secondary | ICD-10-CM | POA: Diagnosis not present

## 2024-10-06 MED ORDER — EZETIMIBE 10 MG PO TABS
10.0000 mg | ORAL_TABLET | Freq: Every day | ORAL | 3 refills | Status: DC
  Filled 2024-10-06: qty 90, 90d supply, fill #0

## 2024-10-06 MED ORDER — ALPRAZOLAM 0.5 MG PO TABS
0.5000 mg | ORAL_TABLET | Freq: Every day | ORAL | 4 refills | Status: DC
  Filled 2024-10-06: qty 30, 30d supply, fill #0

## 2024-10-06 NOTE — Progress Notes (Signed)
 Established patient visit   Patient: Jasmine Webster   DOB: 1926-07-29   89 y.o. Female  MRN: [id number] Visit Date: 10/06/2024  Today's healthcare provider: Rockie Agent, MD   Chief Complaint  Patient presents with   Insomnia    Patient is present to discuss restarting alprazolam . Patient was prescribed this by previous PCP to help with anxiety and sleep. Patient has recently started having trouble sleeping    Hoarse    Patient wanted to give FYI that she has lost her voice due to vocal cord paralysis.     Subjective     HPI     Insomnia    Additional comments: Patient is present to discuss restarting alprazolam . Patient was prescribed this by previous PCP to help with anxiety and sleep. Patient has recently started having trouble sleeping         Hoarse    Additional comments: Patient wanted to give FYI that she has lost her voice due to vocal cord paralysis.        Last edited by Cherry Chiquita HERO, CMA on 10/06/2024 11:09 AM.       Discussed the use of AI scribe software for clinical note transcription with the patient, who gave verbal consent to proceed.  History of Present Illness Jasmine Webster is a 89 year old female who presents with insomnia.  She experiences insomnia, going to bed around 11 PM but waking up by 12:30 AM. On some nights, she manages to sleep for about four hours. During the day, she feels the need to take naps due to insufficient nighttime sleep.  She previously used Xanax  for sleep, which she stopped approximately two years ago without withdrawal symptoms.  She reports a hoarse voice that began around the first of October. She has been evaluated by an ENT and is scheduled to see a larynx specialist in March. She uses a nasal spray as part of her current treatment. A CT and MRI have been performed, and she has been to the ER twice for this issue. The hoarseness has improved slightly since onset.  No pain, choking, or gasping  upon waking. No sore throat.     Past Medical History:  Diagnosis Date   Arthritis    Cataract    cataract removal bilaterally approx 20 years ago   Chronic back pain    COVID-19 virus infection 12/2020   Diabetes mellitus without complication (HCC)    GI bleed    Hyperlipidemia    Hypertension    Iron  deficiency anemia    Non-obstructive CAD (coronary artery disease)    a. 04/2022 Cath: LM 10ost, LAD 30p, RI 60, LCX min irregs, LPAV min irregs, RCA min irregs.   Severe aortic stenosis    a. 05/2022 Echo: EF 55-60%, no rwma, mild LVH, GrII DD, nl RV fxn, mild MR, mild-mod MS, Sev AS (mean grad 33, peak grad 62, AVA 0.6cm^2).    Medications: Outpatient Medications Prior to Visit  Medication Sig   Artificial Tear Ointment (DRY EYES OP) Apply 1 drop to eye daily.   ascorbic acid  (VITAMIN C ) 250 MG tablet Take 1 tablet (250 mg total) by mouth daily.   azelastine (ASTELIN) 0.1 % nasal spray Place 1-2 sprays into both nostrils 2 (two) times daily.   cefdinir  (OMNICEF ) 300 MG capsule Take 600 mg by mouth daily.   cetirizine  (ZYRTEC ) 10 MG tablet Take 1 tablet (10 mg total) by mouth daily.   ciclopirox (PENLAC)  8 % solution Apply topically at bedtime. Apply over nail and surrounding skin. Apply daily over previous coat. After seven (7) days, may remove with alcohol and continue cycle.   co-enzyme Q-10 30 MG capsule Take 30 mg by mouth daily.   cyanocobalamin  (VITAMIN B12) 500 MCG tablet Take 1 tablet (500 mcg total) by mouth once a week.   fluticasone  (FLONASE ) 50 MCG/ACT nasal spray Place 2 sprays into both nostrils daily.   furosemide  (LASIX ) 20 MG tablet TAKE 1 TABLET BY MOUTH EVERY OTHER DAY   furosemide  (LASIX ) 20 MG tablet Take 1 tablet (20 mg total) by mouth every other day.   glucose blood test strip CHECK SUGAR TWICE A DAY   insulin  glargine (LANTUS ) 100 UNIT/ML Solostar Pen Inject 18 Units into the skin.   insulin  glargine-yfgn (SEMGLEE , YFGN,) 100 UNIT/ML Pen INJECT 18 UNITS  SUBCUTANEOUSLY DAILY AS DIRECTED   Insulin  Pen Needle (TECHLITE PEN NEEDLES) 31G X 8 MM MISC CHECK SUGAR 2 TIMES DAILY   iron  polysaccharides (POLY-IRON  150) 150 MG capsule Take 1 capsule (150 mg total) by mouth every other day.   lisinopril  (ZESTRIL ) 10 MG tablet Take 1 tablet (10 mg total) by mouth daily.   Magnesium  400 MG CAPS Take 400 mg by mouth daily.   Multiple Vitamins-Minerals (PRESERVISION AREDS) CAPS Take 1 capsule by mouth 2 (two) times daily.    nitroGLYCERIN  (NITROSTAT ) 0.4 MG SL tablet Place 1 tablet (0.4 mg total) under the tongue every 5 (five) minutes as needed for chest pain.   Omega-3 Fatty Acids (FISH OIL PO) Take by mouth daily.   omeprazole  (PRILOSEC) 40 MG capsule Take 1 capsule (40 mg total) by mouth daily.   ondansetron  (ZOFRAN -ODT) 4 MG disintegrating tablet Dissolve 1 tablet (4 mg total) by mouth every 8 (eight) hours as needed for nausea or vomiting.   ONETOUCH ULTRA test strip CHECK SUGAR TWICE A DAY   OneTouch UltraSoft 2 Lancets MISC USE AS INSTRUCTED   potassium chloride  (KLOR-CON ) 10 MEQ tablet Take 1 tablet (10 mEq total) by mouth daily as needed. Take with furosemide .   silver  sulfADIAZINE  (SILVADENE ) 1 % cream APPLY 1 APPLICATION TOPICALLY DAILY   Turmeric 500 MG CAPS Take by mouth daily.   [DISCONTINUED] ezetimibe  (ZETIA ) 10 MG tablet TAKE 1 TABLET BY MOUTH EVERY DAY   Facility-Administered Medications Prior to Visit  Medication Dose Route Frequency Provider   0.9 %  sodium chloride  infusion   Intravenous Continuous Babara Call, MD    Review of Systems      Objective    BP (!) 137/53 (BP Location: Left Arm, Patient Position: Sitting, Cuff Size: Normal)   Pulse 68   Ht 5' 4 (1.626 m)   Wt 144 lb (65.3 kg)   SpO2 98%   BMI 24.72 kg/m      Physical Exam Vitals reviewed.  Constitutional:      General: She is not in acute distress.    Appearance: Normal appearance. She is not ill-appearing.  Pulmonary:     Effort: Pulmonary effort is  normal. No respiratory distress.  Neurological:     Mental Status: She is alert and oriented to person, place, and time.  Psychiatric:        Mood and Affect: Mood normal.        Behavior: Behavior normal.        Thought Content: Thought content normal.       No results found for any visits on 10/06/24.  Assessment & Plan  Problem List Items Addressed This Visit     Adult hypothyroidism   Benign essential HTN - Primary   Relevant Medications   ezetimibe  (ZETIA ) 10 MG tablet   Cannot sleep   Relevant Medications   ALPRAZolam  (XANAX ) 0.5 MG tablet   Mixed hyperlipidemia   Relevant Medications   ezetimibe  (ZETIA ) 10 MG tablet    Assessment and Plan Assessment & Plan Insomnia Chronic insomnia with difficulty maintaining sleep, waking up after 1.5 hours and sleeping a total of 4 hours. Previously managed with Xanax , discontinued 2 years ago without withdrawal symptoms. Current sleep pattern is insufficient, requiring at least 6 hours of sleep per night. Discussed risks of Xanax , including dependency, dizziness, fall risk, and long-term memory issues, especially in patients over 65. She desires to resume Xanax  for sleep. - Prescribed Xanax  0.5 mg at bedtime with refills for a couple of months.  Mixed hyperlipidemia Chronic  Managed with Zetia . - Refilled Zetia  prescription.  Hoarseness of voice Chronic,improving Since October, with ENT evaluation and referral to a larynx specialist at [facility] for March 10th. Previous imaging ruled out stroke. ENT suggested possible spontaneous resolution, with improvement noted. - Continue follow-up with ENT and larynx specialist.     No follow-ups on file.         Rockie Agent, MD  [facility] [phone] (phone) [phone] (fax)  [facility]

## 2024-10-11 ENCOUNTER — Ambulatory Visit: Admitting: Family Medicine

## 2024-10-18 ENCOUNTER — Inpatient Hospital Stay

## 2024-10-19 ENCOUNTER — Inpatient Hospital Stay: Attending: Oncology

## 2024-10-19 ENCOUNTER — Telehealth: Payer: Self-pay

## 2024-10-19 DIAGNOSIS — D631 Anemia in chronic kidney disease: Secondary | ICD-10-CM | POA: Insufficient documentation

## 2024-10-19 DIAGNOSIS — N189 Chronic kidney disease, unspecified: Secondary | ICD-10-CM | POA: Insufficient documentation

## 2024-10-19 DIAGNOSIS — D500 Iron deficiency anemia secondary to blood loss (chronic): Secondary | ICD-10-CM

## 2024-10-19 LAB — CBC WITH DIFFERENTIAL (CANCER CENTER ONLY)
Abs Immature Granulocytes: 0.08 K/uL — ABNORMAL HIGH (ref 0.00–0.07)
Basophils Absolute: 0.1 K/uL (ref 0.0–0.1)
Basophils Relative: 1 %
Eosinophils Absolute: 0 K/uL (ref 0.0–0.5)
Eosinophils Relative: 0 %
HCT: 38.2 % (ref 36.0–46.0)
Hemoglobin: 12.2 g/dL (ref 12.0–15.0)
Immature Granulocytes: 1 %
Lymphocytes Relative: 15 %
Lymphs Abs: 1.1 K/uL (ref 0.7–4.0)
MCH: 31.7 pg (ref 26.0–34.0)
MCHC: 31.9 g/dL (ref 30.0–36.0)
MCV: 99.2 fL (ref 80.0–100.0)
Monocytes Absolute: 0.8 K/uL (ref 0.1–1.0)
Monocytes Relative: 10 %
Neutro Abs: 5.7 K/uL (ref 1.7–7.7)
Neutrophils Relative %: 73 %
Platelet Count: 237 K/uL (ref 150–400)
RBC: 3.85 MIL/uL — ABNORMAL LOW (ref 3.87–5.11)
RDW: 14 % (ref 11.5–15.5)
WBC Count: 7.7 K/uL (ref 4.0–10.5)
nRBC: 0 % (ref 0.0–0.2)

## 2024-10-19 LAB — RETIC PANEL
Immature Retic Fract: 20.6 % — ABNORMAL HIGH (ref 2.3–15.9)
RBC.: 3.83 MIL/uL — ABNORMAL LOW (ref 3.87–5.11)
Retic Count, Absolute: 75.1 K/uL (ref 19.0–186.0)
Retic Ct Pct: 2 % (ref 0.4–3.1)
Reticulocyte Hemoglobin: 33.6 pg (ref >27.9)

## 2024-10-19 LAB — IRON AND TIBC
Iron: 88 ug/dL (ref 28–170)
Saturation Ratios: 31 % (ref 10.4–31.8)
TIBC: 280 ug/dL (ref 250–450)
UIBC: 192 ug/dL

## 2024-10-19 LAB — FERRITIN: Ferritin: 206 ng/mL (ref 11–307)

## 2024-10-19 NOTE — Telephone Encounter (Signed)
 Please notify patient with below plan: Clipping with + candida Can you see if she is having any improvement on the penlac ? That would be the safest option to continue/it will take awhile to work. Otherwise can consider oral antifungal

## 2024-10-19 NOTE — Telephone Encounter (Signed)
 LMTRC

## 2024-10-20 ENCOUNTER — Inpatient Hospital Stay

## 2024-10-20 ENCOUNTER — Encounter: Payer: Self-pay | Admitting: Oncology

## 2024-10-20 ENCOUNTER — Inpatient Hospital Stay: Admitting: Oncology

## 2024-10-20 VITALS — BP 142/54 | HR 72 | Temp 98.3°F | Resp 18 | Wt 146.0 lb

## 2024-10-20 DIAGNOSIS — D631 Anemia in chronic kidney disease: Secondary | ICD-10-CM

## 2024-10-20 DIAGNOSIS — N183 Chronic kidney disease, stage 3 unspecified: Secondary | ICD-10-CM | POA: Diagnosis not present

## 2024-10-20 DIAGNOSIS — D500 Iron deficiency anemia secondary to blood loss (chronic): Secondary | ICD-10-CM

## 2024-10-20 DIAGNOSIS — N189 Chronic kidney disease, unspecified: Secondary | ICD-10-CM | POA: Diagnosis not present

## 2024-10-20 NOTE — Progress Notes (Signed)
 Hematology/Oncology Progress note [phone] [phone]        CHIEF COMPLAINTS/REASON FOR VISIT:  Anemia  ASSESSMENT & PLAN:  Iron  deficiency anemia Labs are reviewed and discussed with patient. Both hemoglobin and iron  panel have improved. Lab Results  Component Value Date   HGB 12.2 10/19/2024   TIBC 280 10/19/2024   IRONPCTSAT 31 10/19/2024   FERRITIN 206 10/19/2024    Hemoglobin has improved and ferritin is at goal.  Hold off IV Venofer . Continue Niferrex every other day  Anemia in chronic kidney disease (CKD) anemia secondary to chronic kidney disease, Hemoglobin is stable.   Orders Placed This Encounter  Procedures   CBC with Differential (Cancer Center Only)    Standing Status:   Future    Expected Date:   04/20/2025    Expiration Date:   07/19/2025   Iron  and TIBC    Standing Status:   Future    Expected Date:   04/20/2025    Expiration Date:   07/19/2025   Ferritin    Standing Status:   Future    Expected Date:   04/20/2025    Expiration Date:   07/19/2025   Retic Panel    Standing Status:   Future    Expected Date:   04/20/2025    Expiration Date:   07/19/2025   Follow-up 6 months All questions were answered. The patient knows to call the clinic with any problems, questions or concerns.  Zelphia Cap, MD, PhD [facility] Hematology Oncology 10/20/2024     HISTORY OF PRESENTING ILLNESS:  Jasmine Webster is a  89 y.o.  female with PMH listed below who was referred to me for anemia Reviewed patient's recent labs that was done.   Reviewed patient's previous labs ordered by primary care physician's office, anemia is chronic onset , duration is since January 2023. Patient reports very fatigued, lightheaded, shortness of breath with minimal exertion. She denies recent chest pain on exertion, pre-syncopal episodes, or palpitations She had not noticed any recent bleeding such as epistaxis, hematuria or hematochezia.  +Dark stool She denies over the  counter NSAID ingestion. She is on antiplatelets agent-aspirin  81 mg. She denies any pica and eats a variety of diet.  She was hospitalized from 05/21/2022 - 05/24/2022, due to NSTEMI and acute CHF exacerbation, symptomatic anemia, status post 2 unit of PRBC transfusion.  Patient reports feeling better after blood transfusion. she completed evaluation for TAVR and was found to not have anatomy for TAVR.    INTERVAL HISTORY Jasmine Webster is a 89 y.o. female who has above history reviewed by me today presents for acute visit for iron  deficiency anemia Patient is accompanied by her family member.  Fatigue has improved.  Denies any blood in the stool.  She takes Niferex every other day. Hoarse voice since Oct 2025. CT neck showed ill defined vocal cord lesion. Thyroid  goiter with left superior thyroid  nodule. 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 04/2022 Cath: LM 10ost, LAD 30p, RI 60, LCX min irregs, LPAV min irregs, RCA min irregs.   Severe aortic stenosis    a. 05/2022 Echo: EF 55-60%, no rwma, mild LVH, GrII DD, nl RV fxn, mild MR, mild-mod MS, Sev AS (mean grad 33, peak grad 62, AVA 0.6cm^2).    SURGICAL HISTORY: Past Surgical History:  Procedure Laterality Date   ABDOMINAL HYSTERECTOMY     APPENDECTOMY     BACK SURGERY     three   CARDIAC CATHETERIZATION     6+yrs no stents Dr. Morgan Morita   CATARACT EXTRACTION     RIGHT/LEFT HEART CATH AND CORONARY ANGIOGRAPHY N/A 04/26/2022   Procedure:  RIGHT/LEFT HEART CATH AND CORONARY ANGIOGRAPHY;  Surgeon: Darron Deatrice LABOR, MD;  Location: [facility] INVASIVE CV LAB;  Service: Cardiovascular;  Laterality: N/A;   THORACOLUMBAR SYRINGO SHUNT     TONSILLECTOMY AND ADENOIDECTOMY      SOCIAL HISTORY: Social History   Socioeconomic History   Marital status: Widowed    Spouse name: Not on file   Number of children: 2   Years of education: Not on file   Highest education level: 8th grade  Occupational History   Occupation: retired  Tobacco Use   Smoking status: Never   Smokeless tobacco: Never  Vaping Use   Vaping status: Never Used  Substance and Sexual Activity   Alcohol use: No    Alcohol/week: 0.0 standard drinks of alcohol   Drug use: No   Sexual activity: Not Currently  Other Topics Concern   Not on file  Social History Narrative   Lives with daughter, Darice Seats   Social Drivers of Health   Financial Resource Strain: Low Risk  (01/12/2024)   Overall Financial Resource Strain (CARDIA)    Difficulty of Paying Living Expenses: Not hard at all  Food Insecurity: No Food Insecurity (01/12/2024)   Hunger Vital Sign    Worried About Running Out of Food in the Last Year: Never true    Ran Out of Food in the Last Year: Never true  Transportation Needs: No Transportation Needs (01/12/2024)   PRAPARE - Administrator, Civil Service (Medical): No    Lack of Transportation (Non-Medical): No  Physical Activity: Inactive (01/12/2024)   Exercise Vital Sign    Days of Exercise per Week: 0 days    Minutes of Exercise per Session: 0 min  Stress: No Stress Concern Present (01/12/2024)   [employer] of Occupational Health - Occupational Stress Questionnaire    Feeling of Stress : Not at all  Social Connections: Moderately Isolated (01/12/2024)   Social Connection and Isolation Panel    Frequency of Communication with Friends and Family: More than three times a week    Frequency of Social Gatherings with Friends and Family:  More than three times a week    Attends Religious Services: More than 4 times per year    Active Member of [employer] or Organizations: No    Attends Banker Meetings: Never    Marital Status: Never married  Intimate Partner Violence: Not At Risk (01/12/2024)   Humiliation, Afraid, Rape, and Kick questionnaire    Fear of Current or Ex-Partner: No    Emotionally Abused: No    Physically Abused: No    Sexually Abused: No    FAMILY HISTORY: Family History  Problem Relation Age of Onset   Heart disease Mother  died from MI   Heart attack Mother    Heart disease Sister    Sudden death Brother        shot and beaten to death during home invasion   Stroke Sister    Sudden death Sister        MVA   Cancer Brother        died from lung cancer   Sudden death Sister        43 days old   Cancer Brother        died from lung cancer   Stroke Brother        cause of death   Diabetes Father    Stroke Father    Pneumonia Father     ALLERGIES:  is allergic to etodolac, naproxen, statins, latex, terbinafine, and terbinafine hcl.  MEDICATIONS:  Current Outpatient Medications  Medication Sig Dispense Refill   ALPRAZolam  (XANAX ) 0.5 MG tablet Take 1 tablet (0.5 mg total) by mouth at bedtime. 30 tablet 4   Artificial Tear Ointment (DRY EYES OP) Apply 1 drop to eye daily.     ascorbic acid  (VITAMIN C ) 250 MG tablet Take 1 tablet (250 mg total) by mouth daily. 30 tablet 1   azelastine  (ASTELIN ) 0.1 % nasal spray Place 1-2 sprays into both nostrils 2 (two) times daily. 30 mL 12   cefdinir  (OMNICEF ) 300 MG capsule Take 600 mg by mouth daily.     cetirizine  (ZYRTEC ) 10 MG tablet Take 1 tablet (10 mg total) by mouth daily. 30 tablet 11   ciclopirox  (PENLAC ) 8 % solution Apply topically at bedtime. Apply over nail and surrounding skin. Apply daily over previous coat. After seven (7) days, may remove with alcohol and continue cycle. 6.6 mL 0   co-enzyme Q-10 30 MG capsule Take 30 mg by  mouth daily.     cyanocobalamin  (VITAMIN B12) 500 MCG tablet Take 1 tablet (500 mcg total) by mouth once a week. 30 tablet 1   ezetimibe  (ZETIA ) 10 MG tablet TAKE 1 TABLET BY MOUTH EVERY DAY 90 tablet 3   fluticasone  (FLONASE ) 50 MCG/ACT nasal spray Place 2 sprays into both nostrils daily. 16 g 6   furosemide  (LASIX ) 20 MG tablet TAKE 1 TABLET BY MOUTH EVERY OTHER DAY 45 tablet 3   furosemide  (LASIX ) 20 MG tablet Take 1 tablet (20 mg total) by mouth every other day. 45 tablet 3   glucose blood test strip CHECK SUGAR TWICE A DAY 200 each 3   insulin  glargine (LANTUS ) 100 UNIT/ML Solostar Pen Inject 18 Units into the skin. 15 mL 2   insulin  glargine-yfgn (SEMGLEE , YFGN,) 100 UNIT/ML Pen INJECT 18 UNITS SUBCUTANEOUSLY DAILY AS DIRECTED 9 mL 2   Insulin  Pen Needle (TECHLITE PEN NEEDLES) 31G X 8 MM MISC CHECK SUGAR 2 TIMES DAILY 100 each 12   iron  polysaccharides (POLY-IRON  150) 150 MG capsule Take 1 capsule (150 mg total) by mouth every other day. 45 capsule 1   lisinopril  (ZESTRIL ) 10 MG tablet Take 1 tablet (10 mg total) by mouth daily. 90 tablet 1   Magnesium  400 MG CAPS Take 400 mg by mouth daily.     Multiple Vitamins-Minerals (PRESERVISION AREDS) CAPS Take 1 capsule by mouth 2 (two) times daily.      nitroGLYCERIN  (NITROSTAT ) 0.4 MG SL tablet Place 1 tablet (0.4 mg total) under the tongue every 5 (five) minutes as needed for chest pain. 25 tablet 3   Omega-3 Fatty Acids (FISH OIL PO) Take by  mouth daily.     omeprazole  (PRILOSEC) 40 MG capsule Take 1 capsule (40 mg total) by mouth daily. 90 capsule 3   ondansetron  (ZOFRAN -ODT) 4 MG disintegrating tablet Dissolve 1 tablet (4 mg total) by mouth every 8 (eight) hours as needed for nausea or vomiting. 20 tablet 0   ONETOUCH ULTRA test strip CHECK SUGAR TWICE A DAY 200 strip 3   OneTouch UltraSoft 2 Lancets MISC USE AS INSTRUCTED 100 each 12   potassium chloride  (KLOR-CON ) 10 MEQ tablet Take 1 tablet (10 mEq total) by mouth daily as needed. Take  with furosemide . 90 tablet 1   silver  sulfADIAZINE  (SILVADENE ) 1 % cream APPLY 1 APPLICATION TOPICALLY DAILY 50 g 0   Turmeric 500 MG CAPS Take by mouth daily.     No current facility-administered medications for this visit.   Facility-Administered Medications Ordered in Other Visits  Medication Dose Route Frequency Provider Last Rate Last Admin   0.9 %  sodium chloride  infusion   Intravenous Continuous Babara Call, MD 20 mL/hr at 12/11/22 1458 New Bag at 12/11/22 1458    Review of Systems  Constitutional:  Positive for fatigue. Negative for chills and fever.  HENT:   Negative for hearing loss and voice change.        Hoarse voice.   Eyes:  Negative for eye problems.  Respiratory:  Negative for chest tightness, cough and shortness of breath.   Cardiovascular:  Negative for chest pain.  Gastrointestinal:  Negative for abdominal distention and abdominal pain.  Endocrine: Negative for hot flashes.  Genitourinary:  Negative for difficulty urinating and frequency.   Musculoskeletal:  Negative for arthralgias.  Skin:  Negative for itching and rash.  Neurological:  Negative for extremity weakness and light-headedness.  Hematological:  Negative for adenopathy.  Psychiatric/Behavioral:  Negative for confusion.     PHYSICAL EXAMINATION: ECOG PERFORMANCE STATUS: 2 - Symptomatic, <50% confined to bed Vitals:   10/20/24 1344 10/20/24 1349  BP: (!) 146/60 (!) 142/54  Pulse: 72   Resp: 18   Temp: 98.3 F (36.8 C)   SpO2: 100%    Physical Exam Constitutional:      General: She is not in acute distress.    Comments: Patient sits in a wheelchair  HENT:     Head: Normocephalic and atraumatic.  Eyes:     General: No scleral icterus. Cardiovascular:     Rate and Rhythm: Normal rate and regular rhythm.     Heart sounds: Murmur heard.  Pulmonary:     Effort: Pulmonary effort is normal. No respiratory distress.     Breath sounds: No wheezing.  Abdominal:     General: Bowel sounds are  normal. There is no distension.     Palpations: Abdomen is soft.  Musculoskeletal:        General: No deformity. Normal range of motion.     Cervical back: Normal range of motion and neck supple.     Comments: Trace edema bilaterally  Skin:    General: Skin is warm and dry.     Coloration: Skin is not pale.     Findings: No erythema or rash.  Neurological:     Mental Status: She is alert and oriented to person, place, and time. Mental status is at baseline.     Cranial Nerves: No cranial nerve deficit.  Psychiatric:        Mood and Affect: Mood normal.      LABORATORY DATA:  I have reviewed the data as listed  Latest Ref Rng & Units 10/19/2024   11:19 AM 09/22/2024   10:10 AM 09/11/2024    1:50 PM  CBC  WBC 4.0 - 10.5 K/uL 7.7  10.5  5.5   Hemoglobin 12.0 - 15.0 g/dL 87.7  86.4  88.1   Hematocrit 36.0 - 46.0 % 38.2  42.5  36.2   Platelets 150 - 400 K/uL 237  PLATELET CLUMPS NOTED ON SMEAR, UNABLE TO ESTIMATE  178       Latest Ref Rng & Units 09/22/2024   10:10 AM 09/11/2024    1:50 PM 05/11/2024   11:54 AM  CMP  Glucose 70 - 99 mg/dL 854  797  760   BUN 8 - 23 mg/dL 26  25  29    Creatinine 0.44 - 1.00 mg/dL 8.87  8.94  8.84   Sodium 135 - 145 mmol/L 138  135  141   Potassium 3.5 - 5.1 mmol/L 3.9  4.5  4.8   Chloride 98 - 111 mmol/L 108  103  102   CO2 22 - 32 mmol/L 22  24  19    Calcium  8.9 - 10.3 mg/dL 8.2  8.5  9.2   Total Protein 6.5 - 8.1 g/dL 6.6   6.5   Total Bilirubin 0.0 - 1.2 mg/dL 0.8   0.3   Alkaline Phos 38 - 126 U/L 90   146   AST 15 - 41 U/L 37   21   ALT 0 - 44 U/L 52   14       Component Value Date/Time   IRON  88 10/19/2024 1119   IRON  36 12/04/2021 1302   TIBC 280 10/19/2024 1119   TIBC 327 12/04/2021 1302   FERRITIN 206 10/19/2024 1119   FERRITIN 115 10/02/2022 1305   IRONPCTSAT 31 10/19/2024 1119   IRONPCTSAT 11 (L) 12/04/2021 1302     RADIOGRAPHIC STUDIES: I have personally reviewed the radiological images as listed and agreed with  the findings in the report. DG Chest Portable 1 View Result Date: 09/22/2024 EXAM: 1 VIEW(S) XRAY OF THE CHEST 09/22/2024 10:31:15 AM COMPARISON: None available. CLINICAL HISTORY: weakness FINDINGS: LUNGS AND PLEURA: No focal pulmonary opacity. No pulmonary edema. No pleural effusion. No pneumothorax. HEART AND MEDIASTINUM: Aortic atherosclerosis. No acute abnormality of the cardiac and mediastinal silhouettes. BONES AND SOFT TISSUES: Cervical ACDF noted. Intact partially imaged thoracolumbar spinal fixation hardware. IMPRESSION: 1. No acute cardiopulmonary process. Electronically signed by: Helayne Hurst MD 09/22/2024 10:45 AM EST RP Workstation: [id number]

## 2024-10-20 NOTE — Assessment & Plan Note (Signed)
 anemia secondary to chronic kidney disease, Hemoglobin is stable.

## 2024-10-20 NOTE — Assessment & Plan Note (Addendum)
 Labs are reviewed and discussed with patient. Both hemoglobin and iron  panel have improved. Lab Results  Component Value Date   HGB 12.2 10/19/2024   TIBC 280 10/19/2024   IRONPCTSAT 31 10/19/2024   FERRITIN 206 10/19/2024    Hemoglobin has improved and ferritin is at goal.  Hold off IV Venofer . Continue Niferrex every other day

## 2024-10-21 ENCOUNTER — Telehealth (HOSPITAL_COMMUNITY): Payer: Self-pay

## 2024-10-21 ENCOUNTER — Other Ambulatory Visit (HOSPITAL_BASED_OUTPATIENT_CLINIC_OR_DEPARTMENT_OTHER): Payer: Self-pay

## 2024-10-21 ENCOUNTER — Other Ambulatory Visit (HOSPITAL_COMMUNITY): Payer: Self-pay

## 2024-10-21 MED ORDER — CICLOPIROX 8 % EX SOLN
Freq: Every day | CUTANEOUS | 11 refills | Status: DC
  Filled 2024-10-21: qty 6.6, 30d supply, fill #0

## 2024-10-21 NOTE — Addendum Note (Signed)
 Addended by: WILLMA KNEE A on: 10/21/2024 03:45 PM   Modules accepted: Orders

## 2024-10-21 NOTE — Telephone Encounter (Signed)
 Discussed results with patient's daughter Darice and she states that nail is growing out, but patient was concerned about nail and would like someone in our office to trim the nail for her and check to see how it looks. Myles Darice that we do not cut nails, only in office visit, and to send off for testing, but if she would like to upload a photo to MyChart for Dr. Raymund to review that would likely be the fastest method of communication. Darice states that she is comfortable with trimming the nail and uploading a photo to MyChart. Refills sent of Penlac  to patient's preferred pharmacy.

## 2024-10-22 ENCOUNTER — Other Ambulatory Visit (HOSPITAL_COMMUNITY): Payer: Self-pay

## 2024-10-22 ENCOUNTER — Telehealth (HOSPITAL_COMMUNITY): Payer: Self-pay

## 2024-10-22 NOTE — Telephone Encounter (Signed)
 PA request has been Received. New Encounter has been or will be created for follow up. For additional info see Pharmacy Prior Auth telephone encounter from 10/22/24.

## 2024-10-22 NOTE — Telephone Encounter (Signed)
 Pharmacy Patient Advocate Encounter   Received notification from Pt Calls Messages that prior authorization for Ciclopirox  8% solution  is required/requested.   Insurance verification completed.   The patient is insured through [employer] ADVANTAGE/RX ADVANCE.   Per test claim: PA required; PA submitted to above mentioned insurance via Latent Key/confirmation #/EOC [id number] Status is pending

## 2024-10-26 NOTE — Telephone Encounter (Addendum)
 Pharmacy Patient Advocate Encounter  Received notification from [facility] ADVANTAGE/RX ADVANCE that Prior Authorization for ciclopirox  8% topical solution has been DENIED.  Full denial letter will be uploaded to the media tab. See denial reason below. 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 See denial letter for additional details.   PA #/Case ID/Reference #: [id number]

## 2024-10-27 ENCOUNTER — Other Ambulatory Visit (HOSPITAL_COMMUNITY): Payer: Self-pay

## 2024-11-04 ENCOUNTER — Encounter: Payer: Self-pay | Admitting: Oncology

## 2024-11-04 ENCOUNTER — Other Ambulatory Visit: Payer: Self-pay

## 2024-11-08 ENCOUNTER — Other Ambulatory Visit: Payer: Self-pay

## 2024-11-08 ENCOUNTER — Other Ambulatory Visit (HOSPITAL_COMMUNITY): Payer: Self-pay

## 2024-11-08 ENCOUNTER — Encounter: Payer: Self-pay | Admitting: Oncology

## 2024-11-12 ENCOUNTER — Other Ambulatory Visit (HOSPITAL_BASED_OUTPATIENT_CLINIC_OR_DEPARTMENT_OTHER): Payer: Self-pay

## 2024-11-12 ENCOUNTER — Other Ambulatory Visit: Payer: Self-pay

## 2024-11-12 ENCOUNTER — Other Ambulatory Visit (HOSPITAL_COMMUNITY): Payer: Self-pay

## 2024-11-12 MED ORDER — GABAPENTIN 100 MG PO CAPS
100.0000 mg | ORAL_CAPSULE | Freq: Every day | ORAL | 3 refills | Status: DC
  Filled 2024-11-12: qty 30, 30d supply, fill #0

## 2024-11-15 ENCOUNTER — Other Ambulatory Visit: Payer: Self-pay

## 2024-11-15 ENCOUNTER — Encounter: Payer: Self-pay | Admitting: Oncology

## 2024-11-15 ENCOUNTER — Other Ambulatory Visit (HOSPITAL_COMMUNITY): Payer: Self-pay

## 2024-11-16 ENCOUNTER — Other Ambulatory Visit: Payer: Self-pay | Admitting: Otolaryngology

## 2024-11-16 ENCOUNTER — Other Ambulatory Visit (HOSPITAL_COMMUNITY): Payer: Self-pay

## 2024-11-16 DIAGNOSIS — E041 Nontoxic single thyroid nodule: Secondary | ICD-10-CM

## 2024-11-16 DIAGNOSIS — R1310 Dysphagia, unspecified: Secondary | ICD-10-CM

## 2024-11-17 ENCOUNTER — Ambulatory Visit
Admission: RE | Admit: 2024-11-17 | Discharge: 2024-11-17 | Disposition: A | Discharge status: Home / Self Care | Source: Ambulatory Visit | Attending: Otolaryngology | Admitting: Otolaryngology

## 2024-11-17 DIAGNOSIS — R1310 Dysphagia, unspecified: Secondary | ICD-10-CM | POA: Insufficient documentation

## 2024-11-17 NOTE — Progress Notes (Addendum)
 Modified Barium Swallow Study  Patient Details  Name: Jasmine Webster MRN: [id number] Date of Birth: 05-04-1926  Today's Date: 11/17/2024  Modified Barium Swallow completed.  Full report located under Chart Review in the Imaging Section.  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  Pt described the onset of loss of voice and dysphagia, with choking episodes on liquids and difficulty clearing solid foods (chicken, meats, bulky foods) when swallowing.  ENT evaluation ongoing with scheduled appointment with Dr. Landon at [facility] on March 10th. Thyroid  nodule aspiration planned.  Per chart history, pt has dxs including several thyroid  nodules, DM, HTN, CHF, GERD, CAD.  Per her report, she has been exeperincing increased HAs in recent months.   MRI of brain 08/2024: No acute intracranial abnormality.   CT Soft Tissue Neck 08/2024: Ill-defined lesion at the left true vocal cord measuring 5 x 5 x 8 mm with  medial deviation of the cord; endoscopic evaluation is recommended.  2. Multinodular thyroid  goiter with a left superior thyroid  nodule measuring  2.2 cm; recommend non-emergent thyroid  ultrasound as per ACR guidelines.   Pt has been adjusting her diet and eating less solid foods, choosing instead easy to chew/mash foods. She has added more Soups into her diet. She reported choking when drinking sometimes despite trying to be careful; I just don't eat out much anymore. Pt has trialed thickening her liquids at home using a thickener. Pt c/o significant difficulty swallowing Pills at home.  Pt wears Dentures-U/L.  No recent report/dx'd pneumonia per pt/chart.    Clinical Impression Patient appears to present w/ moderate pharyngo-esophageal phase  dysphagia w/ contributing factors including anatomical/structural changes w/ general neuromuscular weakness. Unknown origin of pt's vocal cord/quality changes and/or the impact of thyroid  nodules, as described by pt occurring in recent 3 months(?). Work-up for these issues is ongoing.  No obvious laryngeal penetration nor aspiration occurred during this study today using the Head Turn RIGHT swallow strategy and f/u Dry swallow to clear any pharyngeal residue. The use of the Head Turn RIGHT strategy was trialed early in the study and immediately utilized for the remainder of the study d/t pt's report of improved comfort during swallowing as well as it appearing more effective during the pharyngeal swallow.   Oral phase is c/b adequate lip closure, bolus preparation and containment, mastication of a softened bolus, and anterior to posterior transit. Trace lingual/oral residue remained; majority on BOT. Swallow initiation occurs primarily at the posterior laryngeal surface of the epiglottis w/ thin/nectar liquids and at the level of the valleculae w/ puree/solid.  Pharyngeal phase is noted for mildly decreased base of tongue retraction, leading to residue collection on the BOT and in the valleculae, pyriform sinuses, w/ trace amounts along the posterior pharyngeal wall. Contributing to the decreased pharyngeal clearance is the min decreased larynageal excursion and anterior hyoid movement. Epiglottic inversion is partially reduced but adequate for airway closure/protection. No overt laryngeal penetration ore aspiration was noted during during this study w/ any consistency. Early on in the study, the Head Turn RIGHT swallow strategy was implemented along w/ pt's subsequent, Dry swallow(s) post initial bolus swallow -- these strategies were Significantly Effective and aided in comfort of pt's pharyngeal swallowing, as well as to the clearing/reducing pharyngeal residue. The Head Turn Left  position increased pt's  perception of swallowing discomfort and did Not appear to aid in more effective clearance of pharyngeal residue and/or effective swallowing. Pt was able to follow the Head Turn RIGHT swallow strategy Independently. Suspect a component of Advanced Age also. Amplitude and duration of the CP opening appeared grossly functional; though noted what appeared to be tissue/anatomical changes in that area which may be of some impact(?). Noted what appeared to be bolus stasis below the UES and at the area of the cervical hardware at ~C5-6.   Results of the study/video/strategies were discussed w/ pt and pt's Daughter- described improvement in pt's swallowing w/ use of the strategies; pt agreed. Highlighted that the strategies can aid overall swallowing but cannot prevent aspiration risk altogether. Pt and Daughter verbalized understanding of risks, strategies, and precautions as well as recommendation to continue a po diet of foods that pt can easily chew/eat/swallow comfortably. Discussed food prep/options; mashing/breaking down foods for ease of chewing/swallowing. Encouraged adding Gravies/Soups to foods/meals. Recommend a more mechanical soft diet w/ chopped and well-moistened meats, thin liquids via CUP; Pills CRUSHED in a Puree for ease/comfort of swallowing -- per Pharmacy OK to crush. Daughter agreed to talk w/ the Pharmacist.  Therapy Recommendations: Recommend f/u w/ skilled ST services post ongoing MD assessments re: her Dysphagia, and Education on swallowing strategies utilized during MBSS as well as aspiration precautions.  Pt is continuing her assessment and diagnostics of her thyroid  and vocal quality changes w/ her MDs in upcoming appointments.  Factors that may increase risk of adverse event in presence of aspiration Jasmine Webster 2021): Frail or deconditioned   Swallow Evaluation Recommendations Recommendations: PO diet PO Diet Recommendation: Dysphagia 3 (Mechanical soft);Thin liquids (Level  0) (gravies, soups, condiments to moisten well) Liquid Administration via: Cup; No straw Medication Administration: Crushed with puree Supervision: Patient able to self-feed;Intermittent supervision/cueing for swallowing strategies Swallowing strategies: Minimize environmental distractions; Slow rate; Small bites/sips; Multiple Dry swallows after each bite/sip; Follow solids with liquids; Head turn RIGHT during swallowing (Throat clear/cough Post meals/po intake) Postural changes: Position pt fully upright for meals; Stay upright 30-60 min after meals; Out of bed for meals Oral care recommendations: Oral care BID (2x/day); Oral care before PO; Pt independent with oral care Recommended consults: Consider ENT consultation; Consider dietitian consultation (Ongoing assessment per MD notes, pt)        Comer Portugal, MS, CCC-SLP Speech Language Pathologist Rehab Services; [facility] [place] [phone] (ascom) Bettye Sitton 11/17/2024,4:35 PM

## 2024-11-24 ENCOUNTER — Ambulatory Visit

## 2024-11-24 DIAGNOSIS — Z0000 Encounter for general adult medical examination without abnormal findings: Secondary | ICD-10-CM

## 2024-11-24 NOTE — Patient Instructions (Addendum)
 Jasmine Webster,  Thank you for taking the time for your Medicare Wellness Visit. I appreciate your continued commitment to your health goals. Please review the care plan we discussed, and feel free to reach out if I can assist you further.  Please note that Annual Wellness Visits do not include a physical exam. Some assessments may be limited, especially if the visit was conducted virtually. If needed, we may recommend an in-person follow-up with your provider.  Ongoing Care Seeing your primary care provider every 3 to 6 months helps us  monitor your health and provide consistent, personalized care. APPT W/ DR.SIMMONS-ROBINSON ON 03/07/25 @ 1:20 PM  Referrals If a referral was made during today's visit and you haven't received any updates within two weeks, please contact the referred provider directly to check on the status.  Recommended Screenings:  Health Maintenance  Topic Date Due   Zoster (Shingles) Vaccine (1 of 2) Never done   DTaP/Tdap/Td vaccine (2 - Tdap) 03/29/2020   COVID-19 Vaccine (5 - 2025-26 season) 07/19/2024   Eye exam for diabetics  07/22/2024   Hemoglobin A1C  11/10/2024   Complete foot exam   01/11/2025   Medicare Annual Wellness Visit  11/24/2025   Pneumococcal Vaccine for age over 52  Completed   Flu Shot  Completed   Osteoporosis screening with Bone Density Scan  Completed   Meningitis B Vaccine  Aged Out   Breast Cancer Screening  Discontinued     Vision: Annual vision screenings are recommended for early detection of glaucoma, cataracts, and diabetic retinopathy. These exams can also reveal signs of chronic conditions such as diabetes and high blood pressure.  Dental: Annual dental screenings help detect early signs of oral cancer, gum disease, and other conditions linked to overall health, including heart disease and diabetes.  Please see the attached documents for additional preventive care recommendations.   NEXT AWV  11/30/25 @ 1:50 PM IN PERSON

## 2024-11-24 NOTE — Progress Notes (Signed)
 "  Chief Complaint  Patient presents with   Medicare Wellness     Subjective:   Jasmine Webster is a 89 y.o. female who presents for a Medicare Annual Wellness Visit.  Visit info / Clinical Intake: Medicare Wellness Visit Type:: Subsequent Annual Wellness Visit Persons participating in visit and providing information:: patient Medicare Wellness Visit Mode:: Telephone If telephone:: video declined Since this visit was completed virtually, some vitals may be partially provided or unavailable. Missing vitals are due to the limitations of the virtual format.: Unable to obtain vitals - no equipment If Telephone or Video please confirm:: I connected with patient using audio/video enable telemedicine. I verified patient identity with two identifiers, discussed telehealth limitations, and patient agreed to proceed. Patient Location:: HOME Provider Location:: OFFICE Interpreter Needed?: No Pre-visit prep was completed: yes AWV questionnaire completed by patient prior to visit?: no Living arrangements:: with family/others (W/ DAUGHTER) Patient's Overall Health Status Rating: very good Typical amount of pain: none Does pain affect daily life?: no Are you currently prescribed opioids?: no  Dietary Habits and Nutritional Risks How many meals a day?: 3 Eats fruit and vegetables daily?: yes Most meals are obtained by: having others provide food JOURNALIST, NEWSPAPER) In the last 2 weeks, have you had any of the following?: none Diabetic:: (!) yes Any non-healing wounds?: no How often do you check your BS?: 1 (EVERY MORNING) Would you like to be referred to a Nutritionist or for Diabetic Management? : no  Functional Status Activities of Daily Living (to include ambulation/medication): Independent Ambulation: Independent Medication Administration: Independent Home Management (perform basic housework or laundry): Independent Manage your own finances?: yes Primary transportation is: family /  friends Concerns about vision?: no *vision screening is required for WTM* (WEARS GLASSES- DR.DINGELDEIN) Concerns about hearing?: (!) yes Uses hearing aids?: (!) yes Hear whispered voice?: (!) no *in-person visit only*  Fall Screening Falls in the past year?: 0 Number of falls in past year: 0 Was there an injury with Fall?: 0 Fall Risk Category Calculator: 0 Patient Fall Risk Level: Low Fall Risk  Fall Risk Patient at Risk for Falls Due to: No Fall Risks Fall risk Follow up: Falls evaluation completed; Falls prevention discussed  Home and Transportation Safety: All rugs have non-skid backing?: yes All stairs or steps have railings?: yes (STAIR CHAIR TAKES HER UPSTAIRS) Grab bars in the bathtub or shower?: yes Have non-skid surface in bathtub or shower?: yes Good home lighting?: yes Regular seat belt use?: yes Hospital stays in the last year:: no  Cognitive Assessment Difficulty concentrating, remembering, or making decisions? : no Will 6CIT or Mini Cog be Completed: yes What year is it?: 0 points What month is it?: 0 points Give patient an address phrase to remember (5 components): [address] [place], [place] About what time is it?: 0 points Count backwards from 20 to 1: 0 points Say the months of the year in reverse: 4 points Repeat the address phrase from earlier: 8 points 6 CIT Score: 12 points  Advance Directives (For Healthcare) Does Patient Have a Medical Advance Directive?: No Type of Advance Directive: Out of facility DNR (pink MOST or yellow form) Out of facility DNR (pink MOST or yellow form) in Chart? (Ambulatory ONLY): No - copy requested Would patient like information on creating a medical advance directive?: No - Patient declined  Reviewed/Updated  Reviewed/Updated: Reviewed All (Medical, Surgical, Family, Medications, Allergies, Care Teams, Patient Goals)    Allergies (verified) Etodolac, Naproxen, Statins, Latex, Terbinafine,  and Terbinafine hcl    Current Medications (verified) Outpatient Encounter Medications as of 11/24/2024  Medication Sig   ALPRAZolam  (XANAX ) 0.5 MG tablet Take 1 tablet (0.5 mg total) by mouth at bedtime.   Artificial Tear Ointment (DRY EYES OP) Apply 1 drop to eye daily.   ascorbic acid  (VITAMIN C ) 250 MG tablet Take 1 tablet (250 mg total) by mouth daily.   azelastine  (ASTELIN ) 0.1 % nasal spray Place 1-2 sprays into both nostrils 2 (two) times daily.   ciclopirox  (PENLAC ) 8 % solution Apply topically at bedtime. Apply over nail and surrounding skin. Apply daily over previous coat. After seven (7) days, may remove with alcohol and continue cycle.   co-enzyme Q-10 30 MG capsule Take 30 mg by mouth daily.   ezetimibe  (ZETIA ) 10 MG tablet TAKE 1 TABLET BY MOUTH EVERY DAY   fluticasone  (FLONASE ) 50 MCG/ACT nasal spray Place 2 sprays into both nostrils daily.   furosemide  (LASIX ) 20 MG tablet TAKE 1 TABLET BY MOUTH EVERY OTHER DAY   gabapentin  (NEURONTIN ) 100 MG capsule Take 1 capsule (100 mg total) by mouth at bedtime. (Patient taking differently: Take 100 mg by mouth at bedtime. TAKES EVERY OTHER NIGHT)   glucose blood test strip CHECK SUGAR TWICE A DAY   insulin  glargine (LANTUS ) 100 UNIT/ML Solostar Pen Inject 18 Units into the skin.   insulin  glargine-yfgn (SEMGLEE , YFGN,) 100 UNIT/ML Pen INJECT 18 UNITS SUBCUTANEOUSLY DAILY AS DIRECTED   Insulin  Pen Needle (TECHLITE PEN NEEDLES) 31G X 8 MM MISC CHECK SUGAR 2 TIMES DAILY   iron  polysaccharides (POLY-IRON  150) 150 MG capsule Take 1 capsule (150 mg total) by mouth every other day.   lisinopril  (ZESTRIL ) 10 MG tablet Take 1 tablet (10 mg total) by mouth daily.   nitroGLYCERIN  (NITROSTAT ) 0.4 MG SL tablet Place 1 tablet (0.4 mg total) under the tongue every 5 (five) minutes as needed for chest pain.   omeprazole  (PRILOSEC) 40 MG capsule Take 1 capsule (40 mg total) by mouth daily.   ondansetron  (ZOFRAN -ODT) 4 MG disintegrating tablet Dissolve 1 tablet (4 mg total) by  mouth every 8 (eight) hours as needed for nausea or vomiting.   ONETOUCH ULTRA test strip CHECK SUGAR TWICE A DAY   OneTouch UltraSoft 2 Lancets MISC USE AS INSTRUCTED   cefdinir  (OMNICEF ) 300 MG capsule Take 600 mg by mouth daily. (Patient not taking: Reported on 11/24/2024)   cetirizine  (ZYRTEC ) 10 MG tablet Take 1 tablet (10 mg total) by mouth daily. (Patient not taking: Reported on 11/24/2024)   cyanocobalamin  (VITAMIN B12) 500 MCG tablet Take 1 tablet (500 mcg total) by mouth once a week. (Patient not taking: Reported on 11/24/2024)   furosemide  (LASIX ) 20 MG tablet Take 1 tablet (20 mg total) by mouth every other day.   Magnesium  400 MG CAPS Take 400 mg by mouth daily. (Patient not taking: Reported on 11/24/2024)   Multiple Vitamins-Minerals (PRESERVISION AREDS) CAPS Take 1 capsule by mouth 2 (two) times daily.  (Patient not taking: Reported on 11/24/2024)   Omega-3 Fatty Acids (FISH OIL PO) Take by mouth daily. (Patient not taking: Reported on 11/24/2024)   potassium chloride  (KLOR-CON ) 10 MEQ tablet Take 1 tablet (10 mEq total) by mouth daily as needed. Take with furosemide . (Patient not taking: Reported on 11/24/2024)   silver  sulfADIAZINE  (SILVADENE ) 1 % cream APPLY 1 APPLICATION TOPICALLY DAILY (Patient not taking: Reported on 11/24/2024)   Turmeric 500 MG CAPS Take by mouth daily. (Patient not taking: Reported on 11/24/2024)  Facility-Administered Encounter Medications as of 11/24/2024  Medication   0.9 %  sodium chloride  infusion    History: Past Medical History:  Diagnosis Date   Arthritis    Cataract    cataract removal bilaterally approx 20 years ago   Chronic back pain    COVID-19 virus infection 12/2020   Diabetes mellitus without complication (HCC)    GI bleed    Hyperlipidemia    Hypertension    Iron  deficiency anemia    Non-obstructive CAD (coronary artery disease)    a. 04/2022 Cath: LM 10ost, LAD 30p, RI 60, LCX min irregs, LPAV min irregs, RCA min irregs.   Severe aortic  stenosis    a. 05/2022 Echo: EF 55-60%, no rwma, mild LVH, GrII DD, nl RV fxn, mild MR, mild-mod MS, Sev AS (mean grad 33, peak grad 62, AVA 0.6cm^2).   Past Surgical History:  Procedure Laterality Date   ABDOMINAL HYSTERECTOMY     APPENDECTOMY     BACK SURGERY     three   CARDIAC CATHETERIZATION     6+yrs no stents Dr. Morgan Morita   CATARACT EXTRACTION     RIGHT/LEFT HEART CATH AND CORONARY ANGIOGRAPHY N/A 04/26/2022   Procedure: RIGHT/LEFT HEART CATH AND CORONARY ANGIOGRAPHY;  Surgeon: Darron Deatrice LABOR, MD;  Location: [facility] INVASIVE CV LAB;  Service: Cardiovascular;  Laterality: N/A;   THORACOLUMBAR SYRINGO SHUNT     TONSILLECTOMY AND ADENOIDECTOMY     Family History  Problem Relation Age of Onset   Heart disease Mother        died from MI   Heart attack Mother    Heart disease Sister    Sudden death Brother        shot and beaten to death during home invasion   Stroke Sister    Sudden death Sister        MVA   Cancer Brother        died from lung cancer   Sudden death Sister        69 days old   Cancer Brother        died from lung cancer   Stroke Brother        cause of death   Diabetes Father    Stroke Father    Pneumonia Father    Social History   Occupational History   Occupation: retired  Tobacco Use   Smoking status: Never   Smokeless tobacco: Never  Vaping Use   Vaping status: Never Used  Substance and Sexual Activity   Alcohol use: No    Alcohol/week: 0.0 standard drinks of alcohol   Drug use: No   Sexual activity: Not Currently   Tobacco Counseling Counseling given: Not Answered  SDOH Screenings   Food Insecurity: No Food Insecurity (11/12/2024)   Received from [facility]  Housing: Low Risk  (11/12/2024)   Received from [facility]  Transportation Needs: No Transportation Needs (11/12/2024)   Received from [facility]  Utilities: Not At Risk (11/12/2024)   Received from [facility]  Alcohol Screen: Low Risk (01/12/2024)  Depression (PHQ2-9): Low Risk (11/24/2024)  Recent Concern: Depression (PHQ2-9) - Medium Risk (10/06/2024)  Financial Resource Strain: Low Risk  (11/12/2024)   Received from [facility]  Physical Activity: Insufficiently Active (11/24/2024)  Social Connections: Moderately Isolated (01/12/2024)  Stress: No Stress Concern Present (11/24/2024)  Tobacco Use: Low Risk (11/24/2024)  Health Literacy: Adequate Health Literacy (01/12/2024)  See flowsheets for full screening details  Depression Screen PHQ 2 & 9 Depression Scale- Over the past 2 weeks, how often have you been bothered by any of the following problems? Little interest or pleasure in doing things: 0 Feeling down, depressed, or hopeless (PHQ Adolescent also includes...irritable): 0 PHQ-2 Total Score: 0 Trouble falling or staying asleep, or sleeping too much: 0 Feeling tired or having little energy: 0 Poor appetite or overeating (PHQ Adolescent also includes...weight loss): 0 Feeling bad about yourself - or that you are a failure or have let yourself or your family down: 0 Trouble concentrating on things, such as reading the newspaper or watching television (PHQ Adolescent also includes...like school work): 0 Moving or speaking so slowly that other people could have noticed. Or the opposite - being so fidgety or restless that you have been moving around a lot more than usual: 0 Thoughts that you would be better off dead, or of hurting yourself in some way: 0 PHQ-9 Total Score: 0 If you checked off any problems, how difficult have these problems made it for you to do your work, take care of things at home, or get along with other people?: Not difficult at all     Goals Addressed             This Visit's Progress    DIET - REDUCE SUGAR INTAKE               Objective:    There were no vitals filed for this visit. There is no height or weight on file  to calculate BMI.  Hearing/Vision screen Hearing Screening - Comments:: WEARS AIDS Vision Screening - Comments:: WEARS GLASSES- DR.DINGELDEIN  Immunizations and Health Maintenance Health Maintenance  Topic Date Due   Zoster Vaccines- Shingrix (1 of 2) Never done   DTaP/Tdap/Td (2 - Tdap) 03/29/2020   COVID-19 Vaccine (5 - 2025-26 season) 07/19/2024   OPHTHALMOLOGY EXAM  07/22/2024   HEMOGLOBIN A1C  11/10/2024   FOOT EXAM  01/11/2025   Medicare Annual Wellness (AWV)  11/24/2025   Pneumococcal Vaccine: 50+ Years  Completed   Influenza Vaccine  Completed   Bone Density Scan  Completed   Meningococcal B Vaccine  Aged Out   Mammogram  Discontinued        Assessment/Plan:  This is a routine wellness examination for [place].  Patient Care Team: Sharma Coyer, MD as PCP - General (Family Medicine) Perla Evalene PARAS, MD as PCP - Cardiology (Cardiology) Jaye Fallow, MD as Referring Physician (Ophthalmology) Loreda Hacker, DPM as Consulting Physician (Podiatry) Perla, Evalene PARAS, MD as Consulting Physician (Cardiology) Babara Call, MD as Consulting Physician (Oncology)  I have personally reviewed and noted the following in the patients chart:   Medical and social history Use of alcohol, tobacco or illicit drugs  Current medications and supplements including opioid prescriptions. Functional ability and status Nutritional status Physical activity Advanced directives List of other physicians Hospitalizations, surgeries, and ER visits in previous 12 months Vitals Screenings to include cognitive, depression, and falls Referrals and appointments  No orders of the defined types were placed in this encounter.  In addition, I have reviewed and discussed with patient certain preventive protocols, quality metrics, and best practice recommendations. A written personalized care plan for preventive services as well as general preventive health recommendations were provided  to patient.   Jhonnie GORMAN Das, LPN   06/19/7972   Return in 1 year (on 11/24/2025).  After Visit Summary: (MyChart) Due to this being  a telephonic visit, the after visit summary with patients personalized plan was offered to patient via MyChart   Nurse Notes: NEEDS TDAP, SHINGRIX; AGED OUT OF MAMMOGRAM, COLONOSCOPY & BDS  "

## 2024-11-25 ENCOUNTER — Telehealth: Payer: Self-pay

## 2024-11-25 NOTE — Telephone Encounter (Signed)
 Patient for US  guided FNA thyroid  biopsy on Friday 11/26/24, I called and spoke with the patient's daughter, Darice on the phone and gave pre-procedure instructions. Darice was made aware to have the patient here at 12:30p and check in at the [facility] [employer] [facility]. Darice stated understanding. Called 11/25/24

## 2024-11-26 ENCOUNTER — Ambulatory Visit
Admission: RE | Admit: 2024-11-26 | Discharge: 2024-11-26 | Disposition: A | Source: Ambulatory Visit | Attending: Otolaryngology | Admitting: Otolaryngology

## 2024-11-26 VITALS — BP 157/67 | HR 62

## 2024-11-26 DIAGNOSIS — E041 Nontoxic single thyroid nodule: Secondary | ICD-10-CM | POA: Diagnosis present

## 2024-11-26 DIAGNOSIS — R896 Abnormal cytological findings in specimens from other organs, systems and tissues: Secondary | ICD-10-CM | POA: Insufficient documentation

## 2024-11-26 MED ORDER — LIDOCAINE HCL (PF) 1 % IJ SOLN
2.0000 mL | Freq: Once | INTRAMUSCULAR | Status: AC
  Administered 2024-11-26: 2 mL via INTRADERMAL

## 2024-11-30 LAB — CYTOLOGY - NON PAP

## 2024-12-06 ENCOUNTER — Ambulatory Visit (INDEPENDENT_AMBULATORY_CARE_PROVIDER_SITE_OTHER)

## 2024-12-06 ENCOUNTER — Ambulatory Visit
Admission: EM | Admit: 2024-12-06 | Discharge: 2024-12-06 | Disposition: A | Discharge status: Home / Self Care | Attending: Emergency Medicine | Admitting: Emergency Medicine

## 2024-12-06 ENCOUNTER — Encounter: Payer: Self-pay | Admitting: *Deleted

## 2024-12-06 DIAGNOSIS — R062 Wheezing: Secondary | ICD-10-CM | POA: Diagnosis not present

## 2024-12-06 DIAGNOSIS — R0602 Shortness of breath: Secondary | ICD-10-CM | POA: Diagnosis not present

## 2024-12-06 DIAGNOSIS — R051 Acute cough: Secondary | ICD-10-CM

## 2024-12-06 MED ORDER — PREDNISONE 10 MG PO TABS: 30.0000 mg | ORAL_TABLET | Freq: Every day | ORAL | 0 refills | Status: AC

## 2024-12-06 MED ORDER — ALBUTEROL SULFATE HFA 108 (90 BASE) MCG/ACT IN AERS: 1.0000 | INHALATION_SPRAY | Freq: Four times a day (QID) | RESPIRATORY_TRACT | 0 refills | Status: DC | PRN

## 2024-12-06 MED ORDER — ALBUTEROL SULFATE (2.5 MG/3ML) 0.083% IN NEBU
2.5000 mg | INHALATION_SOLUTION | Freq: Once | RESPIRATORY_TRACT | Status: AC
  Administered 2024-12-06: 2.5 mg via RESPIRATORY_TRACT

## 2024-12-06 NOTE — Discharge Instructions (Signed)
 Follow up with your primary care provider tomorrow.  Go to the emergency department if you have worsening symptoms.    Your chest x-ray is pending.  I will call you with the result.    Your COVID and flu tests are negative.    Use the albuterol  inhaler as directed.  Take the prednisone  as directed.

## 2024-12-06 NOTE — ED Provider Notes (Signed)
 " Jasmine Webster    CSN: [id number] Arrival date & time: 12/06/24  1726      History   Chief Complaint Chief Complaint  Patient presents with   Shortness of Breath   Wheezing    HPI Jasmine Webster is a 89 y.o. female.  Accompanied by her daughter, patient presents with 3-day history of cough, wheezing, shortness of breath.  No fever or chest pain.  No OTC medications taken today.  Patient's medical history includes heart failure, pulmonary hypertension, NSTEMI, hypertension, diabetes, CKD, vocal cord paralysis, thyroid  nodule.  She reports no history of lung disease.  The history is provided by the patient, a relative and medical records.    Past Medical History:  Diagnosis Date   Arthritis    Cataract    cataract removal bilaterally approx 20 years ago   Chronic back pain    COVID-19 virus infection 12/2020   Diabetes mellitus without complication (HCC)    GI bleed    Hyperlipidemia    Hypertension    Iron  deficiency anemia    Non-obstructive CAD (coronary artery disease)    a. 04/2022 Cath: LM 10ost, LAD 30p, RI 60, LCX min irregs, LPAV min irregs, RCA min irregs.   Severe aortic stenosis    a. 05/2022 Echo: EF 55-60%, no rwma, mild LVH, GrII DD, nl RV fxn, mild MR, mild-mod MS, Sev AS (mean grad 33, peak grad 62, AVA 0.6cm^2).    Patient Active Problem List   Diagnosis Date Noted   Nausea without vomiting 07/12/2024   Allergic rhinitis due to allergen 09/24/2023   Stage 3b chronic kidney disease (CKD) (HCC) 11/26/2022   Melena 11/25/2022   Normocytic anemia 06/26/2022   Iron  deficiency anemia 06/26/2022   Anemia in chronic kidney disease (CKD) 06/26/2022   Symptomatic anemia 05/22/2022   Severe aortic stenosis    Acute CHF (congestive heart failure) (HCC) 05/21/2022   NSTEMI (non-ST elevated myocardial infarction) (HCC) 05/21/2022   GERD without esophagitis 05/21/2022   Type 2 diabetes mellitus without complications (HCC) 05/21/2022   Dyslipidemia  05/21/2022   Aortic valve stenosis    Pulmonary hypertension (HCC)    Type 2 diabetes mellitus with diabetic chronic kidney disease (HCC) 02/20/2022   Heart failure, unspecified (HCC) 02/20/2022   Chronic kidney disease, stage 4 (severe) (HCC) 02/20/2022   Anemia in chronic kidney disease 02/20/2022   Weakness    Hyponatremia 11/26/2021   Hypertension    Coronary artery disease    COVID-19 virus infection 12/2020   Diabetes mellitus without complication (HCC) 03/16/2020   L-S radiculopathy 01/15/2019   Angina pectoris 12/28/2018   Mixed hyperlipidemia 12/28/2018   Arthritis 03/23/2015   Cardiovascular disease 03/23/2015   Urinary system disease 03/23/2015   Malignant neoplasm of corpus uteri (HCC) 03/23/2015   Benign essential HTN 03/23/2015   Acid reflux 03/23/2015   Cardiac murmur 03/23/2015   Hypercholesteremia 03/23/2015   Adult hypothyroidism 03/23/2015   Cannot sleep 03/23/2015   Malaise and fatigue 03/23/2015   Muscle ache 03/23/2015   Arthritis, degenerative 03/23/2015    Past Surgical History:  Procedure Laterality Date   ABDOMINAL HYSTERECTOMY     APPENDECTOMY     BACK SURGERY     three   CARDIAC CATHETERIZATION     6+yrs no stents Dr. Morgan Morita   CATARACT EXTRACTION     RIGHT/LEFT HEART CATH AND CORONARY ANGIOGRAPHY N/A 04/26/2022   Procedure: RIGHT/LEFT HEART CATH AND CORONARY ANGIOGRAPHY;  Surgeon: Darron Deatrice LABOR, MD;  Location: [facility];  Service: Cardiovascular;  Laterality: N/A;   THORACOLUMBAR SYRINGO SHUNT     TONSILLECTOMY AND ADENOIDECTOMY      OB History   No obstetric history on file.      Home Medications    Prior to Admission medications  Medication Sig Start Date End Date Taking? Authorizing Provider  albuterol  (VENTOLIN  HFA) 108 (90 Base) MCG/ACT inhaler Inhale 1-2 puffs into the lungs every 6 (six) hours as needed. 12/06/24  Yes Corlis Burnard DEL, NP  predniSONE  (DELTASONE ) 10 MG tablet Take 3 tablets (30 mg total)  by mouth daily for 5 days. 12/06/24 12/11/24 Yes Corlis Burnard DEL, NP  ALPRAZolam  (XANAX ) 0.5 MG tablet Take 1 tablet (0.5 mg total) by mouth at bedtime. 10/06/24   Simmons-Robinson, Rockie, MD  Artificial Tear Ointment (DRY EYES OP) Apply 1 drop to eye daily.    [provider]  ascorbic acid  (VITAMIN C ) 250 MG tablet Take 1 tablet (250 mg total) by mouth daily. 11/27/22   Patel, Sona, MD  azelastine  (ASTELIN ) 0.1 % nasal spray Place 1-2 sprays into both nostrils 2 (two) times daily. 09/24/24     cefdinir  (OMNICEF ) 300 MG capsule Take 600 mg by mouth daily. Patient not taking: Reported on 11/24/2024 09/09/24   [provider]  cetirizine  (ZYRTEC ) 10 MG tablet Take 1 tablet (10 mg total) by mouth daily. Patient not taking: Reported on 11/24/2024 09/24/23   Simmons-Robinson, Rockie, MD  ciclopirox  (PENLAC ) 8 % solution Apply topically at bedtime. Apply over nail and surrounding skin. Apply daily over previous coat. After seven (7) days, may remove with alcohol and continue cycle. 10/21/24   Raymund Lauraine BROCKS, MD  co-enzyme Q-10 30 MG capsule Take 30 mg by mouth daily.    [provider]  cyanocobalamin  (VITAMIN B12) 500 MCG tablet Take 1 tablet (500 mcg total) by mouth once a week. Patient not taking: Reported on 11/24/2024 11/27/22   Tobie Calix, MD  ezetimibe  (ZETIA ) 10 MG tablet TAKE 1 TABLET BY MOUTH EVERY DAY 10/06/24   Simmons-Robinson, Rockie, MD  fluticasone  (FLONASE ) 50 MCG/ACT nasal spray Place 2 sprays into both nostrils daily. 09/24/23   Simmons-Robinson, Makiera, MD  furosemide  (LASIX ) 20 MG tablet TAKE 1 TABLET BY MOUTH EVERY OTHER DAY 02/05/24   Gollan, Timothy J, MD  furosemide  (LASIX ) 20 MG tablet Take 1 tablet (20 mg total) by mouth every other day. 02/05/24   Gollan, Timothy J, MD  gabapentin  (NEURONTIN ) 100 MG capsule Take 1 capsule (100 mg total) by mouth at bedtime. Patient taking differently: Take 100 mg by mouth at bedtime. TAKES EVERY OTHER NIGHT 11/12/24    Evern Allyson Hacker, FNP  glucose blood test strip CHECK SUGAR TWICE A DAY 03/22/24   Simmons-Robinson, Rockie, MD  insulin  glargine (LANTUS ) 100 UNIT/ML Solostar Pen Inject 18 Units into the skin. 01/19/24   Simmons-Robinson, Makiera, MD  insulin  glargine-yfgn (SEMGLEE , YFGN,) 100 UNIT/ML Pen INJECT 18 UNITS SUBCUTANEOUSLY DAILY AS DIRECTED 01/19/24   Simmons-Robinson, Makiera, MD  Insulin  Pen Needle (TECHLITE PEN NEEDLES) 31G X 8 MM MISC CHECK SUGAR 2 TIMES DAILY 06/17/24   Simmons-Robinson, Rockie, MD  iron  polysaccharides (POLY-IRON  150) 150 MG capsule Take 1 capsule (150 mg total) by mouth every other day. 08/06/24   Babara Call, MD  lisinopril  (ZESTRIL ) 10 MG tablet Take 1 tablet (10 mg total) by mouth daily. 08/06/24   Simmons-Robinson, Rockie, MD  Magnesium  400 MG CAPS Take 400 mg by mouth daily. Patient not taking:  Reported on 11/24/2024    [provider]  Multiple Vitamins-Minerals (PRESERVISION AREDS) CAPS Take 1 capsule by mouth 2 (two) times daily.  Patient not taking: Reported on 11/24/2024    [provider]  nitroGLYCERIN  (NITROSTAT ) 0.4 MG SL tablet Place 1 tablet (0.4 mg total) under the tongue every 5 (five) minutes as needed for chest pain. 07/02/21   Gollan, Timothy J, MD  Omega-3 Fatty Acids (FISH OIL PO) Take by mouth daily. Patient not taking: Reported on 11/24/2024    [provider]  omeprazole  (PRILOSEC) 40 MG capsule Take 1 capsule (40 mg total) by mouth daily. 10/22/23   Simmons-Robinson, Rockie, MD  ondansetron  (ZOFRAN -ODT) 4 MG disintegrating tablet Dissolve 1 tablet (4 mg total) by mouth every 8 (eight) hours as needed for nausea or vomiting. 07/12/24   Babara Call, MD  [facility] ULTRA test strip CHECK SUGAR TWICE A DAY 08/16/22   Bertrum Charlie CROME, MD  OneTouch UltraSoft 2 Lancets MISC USE AS INSTRUCTED 01/12/24   Simmons-Robinson, Rockie, MD  potassium chloride  (KLOR-CON ) 10 MEQ tablet Take 1 tablet (10 mEq total) by mouth daily as needed. Take with  furosemide . Patient not taking: Reported on 11/24/2024 12/22/23   Simmons-Robinson, Rockie, MD  silver  sulfADIAZINE  (SILVADENE ) 1 % cream APPLY 1 APPLICATION TOPICALLY DAILY Patient not taking: Reported on 11/24/2024 03/28/23   Janit Thresa HERO, DPM  Turmeric 500 MG CAPS Take by mouth daily. Patient not taking: Reported on 11/24/2024    [provider]    Family History Family History  Problem Relation Age of Onset   Heart disease Mother        died from MI   Heart attack Mother    Heart disease Sister    Sudden death Brother        shot and beaten to death during home invasion   Stroke Sister    Sudden death Sister        MVA   Cancer Brother        died from lung cancer   Sudden death Sister        97 days old   Cancer Brother        died from lung cancer   Stroke Brother        cause of death   Diabetes Father    Stroke Father    Pneumonia Father     Social History Social History[1]   Allergies   Etodolac, Naproxen, Statins, Latex, Terbinafine, and Terbinafine hcl   Review of Systems Review of Systems  Constitutional:  Negative for chills and fever.  HENT:  Negative for ear pain and sore throat.   Respiratory:  Positive for cough, shortness of breath and wheezing.   Cardiovascular:  Negative for chest pain and palpitations.     Physical Exam Triage Vital Signs ED Triage Vitals [12/06/24 1734]  Encounter Vitals Group     BP      Girls Systolic BP Percentile      Girls Diastolic BP Percentile      Boys Systolic BP Percentile      Boys Diastolic BP Percentile      Pulse      Resp      Temp      Temp src      SpO2      Weight      Height      Head Circumference      Peak Flow      Pain Score 0  Pain Loc      Pain Education      Exclude from Growth Chart    No data found.  Updated Vital Signs BP (!) 149/76   Pulse 80   Temp (!) 97.4 F (36.3 C)   Resp (!) 22   Wt 144 lb (65.3 kg)   SpO2 97%   BMI 24.72 kg/m   Visual Acuity Right  Eye Distance:   Left Eye Distance:   Bilateral Distance:    Right Eye Near:   Left Eye Near:    Bilateral Near:     Physical Exam Constitutional:      General: She is not in acute distress. HENT:     Right Ear: Tympanic membrane normal.     Left Ear: Tympanic membrane normal.     Nose: Nose normal.     Mouth/Throat:     Mouth: Mucous membranes are moist.     Pharynx: Oropharynx is clear.  Cardiovascular:     Rate and Rhythm: Normal rate and regular rhythm.     Heart sounds: Normal heart sounds.  Pulmonary:     Effort: Pulmonary effort is normal. No respiratory distress.     Breath sounds: Wheezing present.     Comments: Faint bilateral expiratory wheezes. Neurological:     Mental Status: She is alert.      UC Treatments / Results  Labs (all labs ordered are listed, but only abnormal results are displayed) Labs Reviewed  POC COVID19/FLU A&B COMBO    EKG   Radiology DG Chest 2 View Result Date: 12/06/2024 CLINICAL DATA:  Wheezing short of breath EXAM: DG CHEST 2V COMPARISON:  09/22/2024 FINDINGS: In hardware in the cervical spine and thoracolumbar spine. No acute airspace disease, pleural effusion or pneumothorax. Stable cardiomediastinal silhouette with aortic atherosclerosis. IMPRESSION: No active cardiopulmonary disease. Electronically Signed   By: Luke Bun M.D.   On: 12/06/2024 18:15    Procedures Procedures (including critical care time)  Medications Ordered in UC Medications  albuterol  (PROVENTIL ) (2.5 MG/3ML) 0.083% nebulizer solution 2.5 mg (2.5 mg Nebulization Given 12/06/24 1740)    Initial Impression / Assessment and Plan / UC Course  I have reviewed the triage vital signs and the nursing notes.  Pertinent labs & imaging results that were available during my care of the patient were reviewed by me and considered in my medical decision making (see chart for details).    Shortness of breath.  O2 sat 97% on room air.  Albuterol  nebulizer treatment  given here.  Wheezes resolved and patient reports improvement in shortness of breath.  CXR negative.  COVID and flu are negative.  Treating today with albuterol  inhaler and prednisone .  Spacer for albuterol  inhaler given here.  Education provided on shortness of breath and cough.  Instructed patient to follow-up with her PCP tomorrow.  ED precautions given.  Patient and her daughter agreed to plan of care.  Final Clinical Impressions(s) / UC Diagnoses   Final diagnoses:  Shortness of breath  Wheezing  Acute cough     Discharge Instructions      Follow up with your primary care provider tomorrow.  Go to the emergency department if you have worsening symptoms.    Your chest x-ray is pending.  I will call you with the result.    Your COVID and flu tests are negative.    Use the albuterol  inhaler as directed.  Take the prednisone  as directed.         ED  Prescriptions     Medication Sig Dispense Auth. Provider   albuterol  (VENTOLIN  HFA) 108 (90 Base) MCG/ACT inhaler Inhale 1-2 puffs into the lungs every 6 (six) hours as needed. 18 g Corlis Burnard DEL, NP   predniSONE  (DELTASONE ) 10 MG tablet Take 3 tablets (30 mg total) by mouth daily for 5 days. 15 tablet Corlis Burnard DEL, NP      PDMP not reviewed this encounter.     [1]  Social History Tobacco Use   Smoking status: Never   Smokeless tobacco: Never  Vaping Use   Vaping status: Never Used  Substance Use Topics   Alcohol use: No    Alcohol/week: 0.0 standard drinks of alcohol   Drug use: No     Corlis Burnard DEL, NP 12/06/24 1822  "

## 2024-12-06 NOTE — ED Triage Notes (Signed)
 Patient/daughter states 3 days of SOB, wheezing.

## 2024-12-07 ENCOUNTER — Ambulatory Visit: Admitting: Physician Assistant

## 2024-12-08 ENCOUNTER — Other Ambulatory Visit: Payer: Self-pay

## 2024-12-08 ENCOUNTER — Ambulatory Visit: Admitting: Family Medicine

## 2024-12-08 ENCOUNTER — Other Ambulatory Visit (HOSPITAL_COMMUNITY): Payer: Self-pay

## 2024-12-17 ENCOUNTER — Other Ambulatory Visit: Payer: Self-pay

## 2024-12-19 DEATH — deceased

## 2024-12-23 ENCOUNTER — Ambulatory Visit: Admitting: Podiatry

## 2025-03-07 ENCOUNTER — Ambulatory Visit: Admitting: Family Medicine

## 2025-04-18 ENCOUNTER — Inpatient Hospital Stay

## 2025-04-20 ENCOUNTER — Inpatient Hospital Stay: Admitting: Oncology

## 2025-04-20 ENCOUNTER — Inpatient Hospital Stay

## 2025-11-30 ENCOUNTER — Ambulatory Visit
